# Patient Record
Sex: Male | Born: 1950 | Race: Black or African American | Hispanic: No | Marital: Married | Smoking: Former smoker
Health system: Southern US, Community
[De-identification: ages and names within clinical notes are randomized; demographics above are authoritative.]

## PROBLEM LIST (undated history)

## (undated) DIAGNOSIS — I639 Cerebral infarction, unspecified: Secondary | ICD-10-CM

## (undated) DIAGNOSIS — E079 Disorder of thyroid, unspecified: Secondary | ICD-10-CM

## (undated) DIAGNOSIS — I509 Heart failure, unspecified: Secondary | ICD-10-CM

## (undated) DIAGNOSIS — M109 Gout, unspecified: Secondary | ICD-10-CM

## (undated) DIAGNOSIS — C801 Malignant (primary) neoplasm, unspecified: Secondary | ICD-10-CM

## (undated) DIAGNOSIS — I10 Essential (primary) hypertension: Secondary | ICD-10-CM

## (undated) HISTORY — DX: Cerebral infarction, unspecified: I63.9

## (undated) HISTORY — PX: PROSTATE SURGERY: SHX751

---

## 2000-01-14 DIAGNOSIS — C801 Malignant (primary) neoplasm, unspecified: Secondary | ICD-10-CM

## 2000-01-14 HISTORY — DX: Malignant (primary) neoplasm, unspecified: C80.1

## 2000-04-27 ENCOUNTER — Emergency Department (HOSPITAL_COMMUNITY): Admission: EM | Admit: 2000-04-27 | Discharge: 2000-04-27 | Payer: Self-pay | Admitting: *Deleted

## 2000-04-27 ENCOUNTER — Encounter: Payer: Self-pay | Admitting: *Deleted

## 2000-09-11 ENCOUNTER — Ambulatory Visit (HOSPITAL_COMMUNITY): Admission: RE | Admit: 2000-09-11 | Discharge: 2000-09-11 | Payer: Self-pay | Admitting: Pulmonary Disease

## 2000-10-05 ENCOUNTER — Other Ambulatory Visit: Admission: RE | Admit: 2000-10-05 | Discharge: 2000-10-05 | Payer: Self-pay | Admitting: Urology

## 2003-10-23 ENCOUNTER — Encounter (HOSPITAL_COMMUNITY): Admission: RE | Admit: 2003-10-23 | Discharge: 2003-10-23 | Payer: Self-pay | Admitting: Orthopaedic Surgery

## 2003-11-20 ENCOUNTER — Ambulatory Visit (HOSPITAL_COMMUNITY): Admission: RE | Admit: 2003-11-20 | Discharge: 2003-11-20 | Payer: Self-pay | Admitting: Orthopaedic Surgery

## 2003-12-29 ENCOUNTER — Ambulatory Visit (HOSPITAL_COMMUNITY): Admission: RE | Admit: 2003-12-29 | Discharge: 2003-12-29 | Payer: Self-pay | Admitting: General Surgery

## 2004-01-05 ENCOUNTER — Emergency Department (HOSPITAL_COMMUNITY): Admission: EM | Admit: 2004-01-05 | Discharge: 2004-01-06 | Payer: Self-pay | Admitting: *Deleted

## 2004-02-29 ENCOUNTER — Ambulatory Visit (HOSPITAL_COMMUNITY): Admission: RE | Admit: 2004-02-29 | Discharge: 2004-02-29 | Payer: Self-pay | Admitting: General Surgery

## 2004-03-05 ENCOUNTER — Encounter (HOSPITAL_COMMUNITY): Admission: RE | Admit: 2004-03-05 | Discharge: 2004-03-06 | Payer: Self-pay | Admitting: General Surgery

## 2004-04-03 ENCOUNTER — Encounter (HOSPITAL_COMMUNITY): Admission: RE | Admit: 2004-04-03 | Discharge: 2004-05-03 | Payer: Self-pay | Admitting: Internal Medicine

## 2005-01-08 ENCOUNTER — Ambulatory Visit: Payer: Self-pay | Admitting: Cardiology

## 2005-01-17 ENCOUNTER — Ambulatory Visit: Payer: Self-pay | Admitting: Cardiology

## 2005-01-17 ENCOUNTER — Encounter (HOSPITAL_COMMUNITY): Admission: RE | Admit: 2005-01-17 | Discharge: 2005-02-16 | Payer: Self-pay | Admitting: Cardiology

## 2005-01-24 ENCOUNTER — Ambulatory Visit: Payer: Self-pay | Admitting: Cardiology

## 2005-12-18 IMAGING — NM NM THYROID IMAGING W/ UPTAKE SINGLE (24 HR)
4 series · 4 of 4 positions shown · non-contrast
Comparison: none

CLINICAL DATA: History of thyroid disease.
 THYROID UPTAKE AND SCAN
 Thyroid uptake and scan was performed after the intravenous administration of 10.0 mCi Dc-FFm Pertechnetate and oral administration of 100.0 uCi Iodine 123.  Technetium visual uptake estimate reference to the salivary glands and 24-hour Iodine 123 uptake values were performed.  Thyroid scan in the LAO and RAO projections was performed with pinhole images.
 The Technetium visual uptake estimate was 60%.  The 24-hour Iodine 123 uptake was 59.6%.  Both of these values are elevated.  The images of the thyroid glands show fairly homogeneous uptake bilaterally.  Both glands are enlarged, measuring about 5-6 cm in diameter anteriorly.

[Series 1: th thyroid scan · 2.39mm/px · 1 of 1 slices shown (1 of 4)]
[im 1/1]
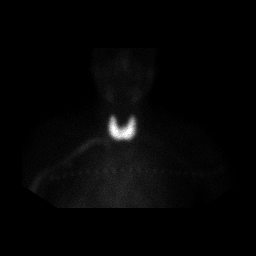

[Series 1: th thyroid scan · 0.54mm/px · 1 of 1 slices shown (2 of 4)]
[im 1/1]
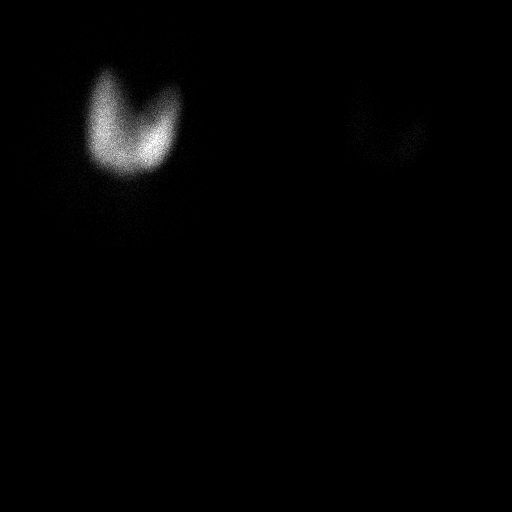

[Series 1: th thyroid scan · 0.54mm/px · 1 of 1 slices shown (3 of 4)]
[im 1/1]
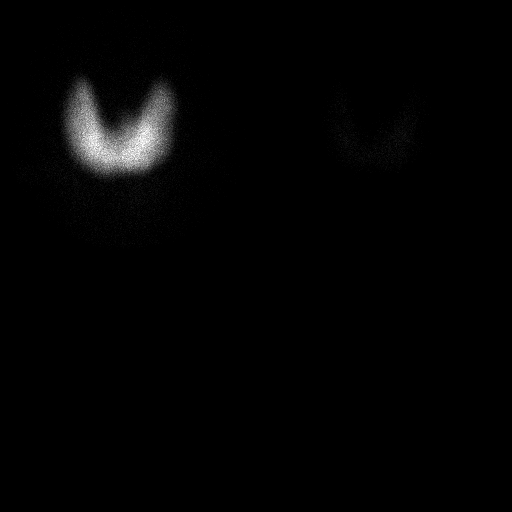

[Series 1: th thyroid scan · 0.54mm/px · 1 of 1 slices shown (4 of 4)]
[im 1/1]
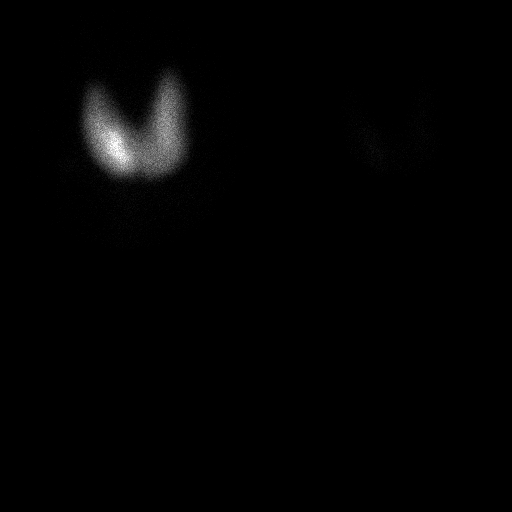

[4 of 4 positions shown; findings below may reference images not displayed]

IMPRESSION: Enlarged glands with increased uptake of 59.6%, suggestive of toxic goiter.

## 2006-11-07 ENCOUNTER — Emergency Department (HOSPITAL_COMMUNITY): Admission: EM | Admit: 2006-11-07 | Discharge: 2006-11-07 | Payer: Self-pay | Admitting: Emergency Medicine

## 2008-06-08 ENCOUNTER — Ambulatory Visit (HOSPITAL_COMMUNITY): Admission: RE | Admit: 2008-06-08 | Discharge: 2008-06-08 | Payer: Self-pay | Admitting: Family Medicine

## 2009-07-27 ENCOUNTER — Encounter: Payer: Self-pay | Admitting: Internal Medicine

## 2009-08-03 ENCOUNTER — Ambulatory Visit: Payer: Self-pay | Admitting: Internal Medicine

## 2009-08-03 ENCOUNTER — Ambulatory Visit (HOSPITAL_COMMUNITY): Admission: RE | Admit: 2009-08-03 | Discharge: 2009-08-03 | Payer: Self-pay | Admitting: Internal Medicine

## 2009-08-07 ENCOUNTER — Encounter: Payer: Self-pay | Admitting: Internal Medicine

## 2010-02-02 ENCOUNTER — Encounter: Payer: Self-pay | Admitting: General Surgery

## 2010-02-12 NOTE — Letter (Signed)
Summary: Patient Notice, Colon Biopsy Results  [facility]  [address]   [place], [place] [address]   Phone: [phone]  Fax: [phone]       August 07, 2009   Edward Rodgers [address] [place], [place]  [address] [birth year]    Dear [name],  I am pleased to inform you that the biopsies taken during your recent colonoscopy did not show any evidence of cancer upon pathologic examination.  Additional information/recommendations:  No further action is needed at this time.  Please follow-up with your primary care physician for your other healthcare needs.  You should have a repeat colonoscopy examination  in 7 years.  Please call us if you are having persistent problems or have questions about your condition that have not been fully answered at this time.  Sincerely,    R. Roetta Sessions MD, FACP [facility] Ph: [phone]    Fax: [phone]   Appended Document: Patient Notice, Colon Biopsy Results Letter mailed to pt.  Appended Document: Patient Notice, Colon Biopsy Results reminder in computer

## 2010-02-12 NOTE — Letter (Signed)
Summary: Internal Other Domingo Dimes  Internal Other Domingo Dimes   Imported By: Cloria Spring LPN 81/19/1478 29:56:21  _____________________________________________________________________  External Attachment:    Type:   Image     Comment:   External Document

## 2010-05-31 NOTE — H&P (Signed)
Edward Rodgers, Edward Rodgers                ACCOUNT NO.:  [id number]   MEDICAL RECORD NO.:  [id number]          PATIENT TYPE:  AMB   LOCATION:  DAY                           FACILITY:  [facility]   PHYSICIAN:  Jerolyn Shin C. Katrinka Blazing, M.D.   DATE OF BIRTH:  05/02/1953   DATE OF ADMISSION:  DATE OF DISCHARGE:  LH                                HISTORY & PHYSICAL   HISTORY OF PRESENT ILLNESS:  A 60 year old male with a history of increasing  weight loss, anorexia, dysphagia, and chronic abdominal pain.  He has been  evaluated since early December.  He has had weight loss from 197 pounds to  162 pounds.  He has pain, which improves with eating.  He has had a recent  change in bowel habits with progressive diarrhea with severe crampy  abdominal discomfort.  The patient also has worsening nocturnal  gastroesophageal reflux and indigestion.  Stools have been guaiac positive.  H. pylori test is positive.  He has been treated with Prevpac.  He remains  symptomatic.  The patient is scheduled for upper and lower endoscopy.   PAST HISTORY:  1.  He has a history of atherosclerotic heart disease.  2.  Congestive heart failure.  3.  Cardiomyopathy.  4.  Hypertension.  5.  Mild dementia.  6.  Prostate cancer.   MEDICATIONS:  1.  Lotrel 10/20 daily.  2.  Flexeril 10 mg q.8h.  3.  Lortab 7.5/750 q.4h. p.r.n.  4.  Nabumetone 750 mg two q.h.s.  5.  Allopurinol 300 mg daily.  6.  Wellbutrin XL 300 mg daily.   SURGICAL HISTORY:  Retropubic prostatectomy in 2002.   REVIEW OF SYSTEMS:  1.  Positive for insomnia.  2.  Anorexia.  3.  Chronic low back pain.  4.  Nocturnal gastroesophageal reflux.  5.  Progressive nocturia.   PHYSICAL EXAMINATION:  VITAL SIGNS:  Blood pressure 140/84, pulse 96,  respirations 18.  Weight 162 pounds.  HEENT:  Unremarkable.  There is no thyromegaly.  NECK:  Supple.  No JVD, bruit, adenopathy, or thyromegaly.  CHEST:  Clear to auscultation.  HEART:  Regular rate and rhythm without  murmur, gallop, or rub.  Mild  tachycardia.  ABDOMEN:  Soft, nontender.  No masses.  EXTREMITIES:  No clubbing, cyanosis, or edema.  RECTAL:  Hollow prostatic fossa.  No masses.  Stool is guaiac positive.  NEUROLOGIC:  No focal motor, sensory, or cerebellar deficit.   IMPRESSION:  1.  Recurrent abdominal pain with weight loss.  2.  Guaiac positive stool.  3.  Gastroesophageal reflux disease.  4.  Helicobacter pylori gastritis with continued symptoms status post      Prevpac treatment.  5.  History of congestive heart failure.  6.  Depression.  7.  Early dementia.   PLAN:  Upper endoscopy with biopsy and total colonoscopy.      LCS/MEDQ  D:  02/28/2004  T:  02/28/2004  Job:  [id number]   cc:   [facility], [place]

## 2010-05-31 NOTE — Procedures (Signed)
Edward Rodgers, Edward Rodgers                ACCOUNT NO.:  [id number]   MEDICAL RECORD NO.:  [id number]           PATIENT TYPE:  REC   LOCATION:  RAD                           FACILITY:  [facility]   PHYSICIAN:  Thomas C. Wall, M.D.   DATE OF BIRTH:  01/08/1979   DATE OF PROCEDURE:  DATE OF DISCHARGE:                                  ECHOCARDIOGRAM   INDICATIONS:  Cardiomegaly (429.3).  Evaluate LV function.   Echocardiogram was technically difficult.   CONCLUSION:  1.  Normal left and right atrial size.  2.  Normal left ventricular chamber size with reduced systolic function.      There is global hypokinesia with an EF around 40%.  It might be a little      lower than this.  There is mild concentric left ventricular hypertrophy.  3.  Minimal mitral regurgitation.  4.  Normal aortic valve.  5.  Normal right sided structure and function.  6.  No pericardial effusion.      Thomas C. Wall, M.D.  Electronically Signed     TCW/MEDQ  D:  01/27/2005  T:  01/28/2005  Job:  [id number]   cc:   Spencer Bing, M.D. [facility]  [address]  [address]  [place]  [place] [address]

## 2013-03-28 ENCOUNTER — Inpatient Hospital Stay (HOSPITAL_COMMUNITY)
Admission: EM | Admit: 2013-03-28 | Discharge: 2013-03-31 | DRG: 065 | Disposition: A | Discharge status: Home / Self Care | Payer: BC Managed Care – PPO | Attending: Family Medicine | Admitting: Family Medicine

## 2013-03-28 ENCOUNTER — Encounter (HOSPITAL_COMMUNITY): Payer: Self-pay | Admitting: Emergency Medicine

## 2013-03-28 ENCOUNTER — Emergency Department (HOSPITAL_COMMUNITY): Payer: BC Managed Care – PPO

## 2013-03-28 DIAGNOSIS — I10 Essential (primary) hypertension: Secondary | ICD-10-CM | POA: Diagnosis present

## 2013-03-28 DIAGNOSIS — R269 Unspecified abnormalities of gait and mobility: Secondary | ICD-10-CM | POA: Diagnosis present

## 2013-03-28 DIAGNOSIS — I5020 Unspecified systolic (congestive) heart failure: Secondary | ICD-10-CM | POA: Diagnosis present

## 2013-03-28 DIAGNOSIS — M47812 Spondylosis without myelopathy or radiculopathy, cervical region: Secondary | ICD-10-CM | POA: Diagnosis present

## 2013-03-28 DIAGNOSIS — Z87891 Personal history of nicotine dependence: Secondary | ICD-10-CM

## 2013-03-28 DIAGNOSIS — R112 Nausea with vomiting, unspecified: Secondary | ICD-10-CM | POA: Diagnosis present

## 2013-03-28 DIAGNOSIS — R42 Dizziness and giddiness: Secondary | ICD-10-CM | POA: Diagnosis present

## 2013-03-28 DIAGNOSIS — I639 Cerebral infarction, unspecified: Secondary | ICD-10-CM | POA: Diagnosis present

## 2013-03-28 DIAGNOSIS — H5010 Unspecified exotropia: Secondary | ICD-10-CM | POA: Diagnosis present

## 2013-03-28 DIAGNOSIS — R7989 Other specified abnormal findings of blood chemistry: Secondary | ICD-10-CM | POA: Diagnosis present

## 2013-03-28 DIAGNOSIS — Z7982 Long term (current) use of aspirin: Secondary | ICD-10-CM

## 2013-03-28 DIAGNOSIS — I509 Heart failure, unspecified: Secondary | ICD-10-CM | POA: Diagnosis present

## 2013-03-28 DIAGNOSIS — R51 Headache: Secondary | ICD-10-CM | POA: Diagnosis present

## 2013-03-28 DIAGNOSIS — D320 Benign neoplasm of cerebral meninges: Secondary | ICD-10-CM | POA: Diagnosis present

## 2013-03-28 DIAGNOSIS — I6350 Cerebral infarction due to unspecified occlusion or stenosis of unspecified cerebral artery: Principal | ICD-10-CM | POA: Diagnosis present

## 2013-03-28 HISTORY — DX: Disorder of thyroid, unspecified: E07.9

## 2013-03-28 HISTORY — DX: Malignant (primary) neoplasm, unspecified: C80.1

## 2013-03-28 HISTORY — DX: Essential (primary) hypertension: I10

## 2013-03-28 HISTORY — DX: Gout, unspecified: M10.9

## 2013-03-28 HISTORY — DX: Heart failure, unspecified: I50.9

## 2013-03-28 LAB — CBC WITH DIFFERENTIAL/PLATELET
Basophils Absolute: 0 10*3/uL (ref 0.0–0.1)
Basophils Relative: 1 % (ref 0–1)
Eosinophils Absolute: 0 10*3/uL (ref 0.0–0.7)
Eosinophils Relative: 0 % (ref 0–5)
HCT: 47.8 % (ref 39.0–52.0)
HEMOGLOBIN: 15.4 g/dL (ref 13.0–17.0)
LYMPHS ABS: 0.6 10*3/uL — AB (ref 0.7–4.0)
LYMPHS PCT: 11 % — AB (ref 12–46)
MCH: 27.6 pg (ref 26.0–34.0)
MCHC: 32.2 g/dL (ref 30.0–36.0)
MCV: 85.7 fL (ref 78.0–100.0)
MONOS PCT: 7 % (ref 3–12)
Monocytes Absolute: 0.4 10*3/uL (ref 0.1–1.0)
NEUTROS ABS: 4.7 10*3/uL (ref 1.7–7.7)
NEUTROS PCT: 81 % — AB (ref 43–77)
PLATELETS: 209 10*3/uL (ref 150–400)
RBC: 5.58 MIL/uL (ref 4.22–5.81)
RDW: 14.2 % (ref 11.5–15.5)
WBC: 5.8 10*3/uL (ref 4.0–10.5)

## 2013-03-28 LAB — COMPREHENSIVE METABOLIC PANEL
ALK PHOS: 82 U/L (ref 39–117)
ALT: 19 U/L (ref 0–53)
AST: 18 U/L (ref 0–37)
Albumin: 4.7 g/dL (ref 3.5–5.2)
BILIRUBIN TOTAL: 0.4 mg/dL (ref 0.3–1.2)
BUN: 10 mg/dL (ref 6–23)
CHLORIDE: 99 meq/L (ref 96–112)
CO2: 28 meq/L (ref 19–32)
Calcium: 9.7 mg/dL (ref 8.4–10.5)
Creatinine, Ser: 0.71 mg/dL (ref 0.50–1.35)
GFR calc non Af Amer: >90 mL/min (ref >90)
GLUCOSE: 116 mg/dL — AB (ref 70–99)
POTASSIUM: 3.9 meq/L (ref 3.7–5.3)
SODIUM: 138 meq/L (ref 137–147)
Total Protein: 9.1 g/dL — ABNORMAL HIGH (ref 6.0–8.3)

## 2013-03-28 LAB — TROPONIN I

## 2013-03-28 MED ORDER — ONDANSETRON 4 MG PO TBDP: ORAL_TABLET | ORAL | Status: DC

## 2013-03-28 MED ORDER — LORAZEPAM 2 MG/ML IJ SOLN
1.0000 mg | Freq: Once | INTRAMUSCULAR | Status: AC
  Administered 2013-03-28: 1 mg via INTRAVENOUS
  Filled 2013-03-28: qty 1

## 2013-03-28 MED ORDER — SODIUM CHLORIDE 0.9 % IV BOLUS (SEPSIS)
1000.0000 mL | Freq: Once | INTRAVENOUS | Status: AC
  Administered 2013-03-28: 1000 mL via INTRAVENOUS

## 2013-03-28 MED ORDER — MECLIZINE HCL 12.5 MG PO TABS
25.0000 mg | ORAL_TABLET | Freq: Once | ORAL | Status: AC
  Administered 2013-03-28: 25 mg via ORAL
  Filled 2013-03-28: qty 2

## 2013-03-28 MED ORDER — ONDANSETRON HCL 4 MG/2ML IJ SOLN
4.0000 mg | Freq: Once | INTRAMUSCULAR | Status: AC
  Administered 2013-03-28: 4 mg via INTRAVENOUS

## 2013-03-28 MED ORDER — MECLIZINE HCL 12.5 MG PO TABS
25.0000 mg | ORAL_TABLET | Freq: Once | ORAL | Status: DC
  Filled 2013-03-28: qty 2

## 2013-03-28 MED ORDER — ONDANSETRON HCL 4 MG/2ML IJ SOLN
4.0000 mg | Freq: Once | INTRAMUSCULAR | Status: DC
  Filled 2013-03-28: qty 2

## 2013-03-28 MED ORDER — FUROSEMIDE 10 MG/ML IJ SOLN: 80.0000 mg | Freq: Once | INTRAMUSCULAR | Status: DC

## 2013-03-28 MED ORDER — MECLIZINE HCL 12.5 MG PO TABS
25.0000 mg | ORAL_TABLET | Freq: Once | ORAL | Status: AC
Start: 2013-03-28 — End: 2013-03-28
  Administered 2013-03-28: 25 mg via ORAL

## 2013-03-28 MED ORDER — MECLIZINE HCL 25 MG PO TABS: ORAL_TABLET | ORAL | Status: DC

## 2013-03-28 MED ORDER — ONDANSETRON 8 MG PO TBDP
8.0000 mg | ORAL_TABLET | Freq: Once | ORAL | Status: AC
  Administered 2013-03-28: 8 mg via ORAL
  Filled 2013-03-28: qty 1

## 2013-03-28 MED ORDER — SODIUM CHLORIDE 0.9 % IV BOLUS (SEPSIS)
500.0000 mL | Freq: Once | INTRAVENOUS | Status: AC
  Administered 2013-03-28: 500 mL via INTRAVENOUS

## 2013-03-28 NOTE — ED Provider Notes (Signed)
CSN: [id number]     Arrival date & time 03/28/13  1917 History   First MD Initiated Contact with Patient 03/28/13 1947     Chief Complaint  Patient presents with  . Dizziness     (Consider location/radiation/quality/duration/timing/severity/associated sxs/prior Treatment) Patient is a 63 y.o. male presenting with dizziness. The history is provided by a relative and the patient (the pt complains of dizziness today).  Dizziness Quality:  Head spinning and imbalance Severity:  Moderate Onset quality:  Sudden Timing:  Intermittent Progression:  Waxing and waning Chronicity:  New Associated symptoms: no chest pain, no diarrhea and no headaches     Past Medical History  Diagnosis Date  . Hypertension   . Gout   . CHF (congestive heart failure)   . Cancer 2002    prostate  . Thyroid disease    Past Surgical History  Procedure Laterality Date  . Prostate surgery     History reviewed. No pertinent family history. History  Substance Use Topics  . Smoking status: Former Research scientist (life sciences)  . Smokeless tobacco: Not on file  . Alcohol Use: No    Review of Systems  Constitutional: Negative for appetite change and fatigue.  HENT: Negative for congestion, ear discharge and sinus pressure.   Eyes: Negative for discharge.  Respiratory: Negative for cough.   Cardiovascular: Negative for chest pain.  Gastrointestinal: Negative for abdominal pain and diarrhea.  Genitourinary: Negative for frequency and hematuria.  Musculoskeletal: Negative for back pain.  Skin: Negative for rash.  Neurological: Positive for dizziness. Negative for seizures and headaches.  Psychiatric/Behavioral: Negative for hallucinations.      Allergies  Review of patient's allergies indicates no known allergies.  Home Medications   Current Outpatient Rx  Name  Route  Sig  Dispense  Refill  . allopurinol (ZYLOPRIM) 300 MG tablet   Oral   Take 300 mg by mouth every morning.         Marland Kitchen amLODipine (NORVASC) 10 MG  tablet   Oral   Take 10 mg by mouth every morning.         Marland Kitchen aspirin EC 81 MG tablet   Oral   Take 81 mg by mouth as needed for mild pain or moderate pain (takes as needed for chest pain).         . Multiple Vitamin (MULTIVITAMIN WITH MINERALS) TABS tablet   Oral   Take 1 tablet by mouth daily.         . solifenacin (VESICARE) 5 MG tablet   Oral   Take 5 mg by mouth every morning.          BP 159/101  Pulse 80  Temp(Src) 97.4 F (36.3 C) (Oral)  Resp 20  Ht 5\' 9"  (1.753 m)  Wt 217 lb (98.431 kg)  BMI 32.03 kg/m2  SpO2 98% Physical Exam  Constitutional: He is oriented to person, place, and time. He appears well-developed.  HENT:  Head: Normocephalic.  Eyes: Conjunctivae and EOM are normal. No scleral icterus.  Neck: Neck supple. No thyromegaly present.  Cardiovascular: Normal rate and regular rhythm.  Exam reveals no gallop and no friction rub.   No murmur heard. Pulmonary/Chest: No stridor. He has no wheezes. He has no rales. He exhibits no tenderness.  Abdominal: He exhibits no distension. There is no tenderness. There is no rebound.  Musculoskeletal: Normal range of motion. He exhibits no edema.  Lymphadenopathy:    He has no cervical adenopathy.  Neurological: He is oriented to person,  place, and time. He exhibits normal muscle tone. Coordination normal.  Skin: No rash noted. No erythema.  Psychiatric: He has a normal mood and affect. His behavior is normal.    ED Course  Procedures (including critical care time) Labs Review Labs Reviewed  CBC WITH DIFFERENTIAL - Abnormal; Notable for the following:    Neutrophils Relative % 81 (*)    Lymphocytes Relative 11 (*)    Lymphs Abs 0.6 (*)    All other components within normal limits  COMPREHENSIVE METABOLIC PANEL  TROPONIN I   Imaging Review No results found.   EKG Interpretation None      MDM   Final diagnoses:  CHF (congestive heart failure)    Pt to get out pt mri and follow up with dr.  Everette Rank this week.    Maudry Diego, MD 03/28/13 2135

## 2013-03-28 NOTE — ED Notes (Signed)
Dizzy, vomiting, left side of neck hurting, and I have been staggering all over the place per pt. Started getting dizzy yesterday, and today it got worse per patient. I have not taken my blood pressure pills today per pt.

## 2013-03-28 NOTE — ED Notes (Signed)
Pt for discharge, declined w/c to car, thinks he's better off walking. Felt all right until he got to registration desk and had overwhelming dizziness and nausea. Pt knelt to floor and triage tech assisted him to chair. I went out to assess situation and pt initially just wanted something to drink and to sit awhile. Gave him some gingerale which he took w/o difficulty but still "just don't feel right".I offered to bring pt back to ED. Wife was pushing to go home, pt wouldn't say yes or no. Dr. Lacinda Axon came out to see pt and tried an ambulation again. Pt felt worse. Lips pale. "I just don't feel right". Pt brought back to ED. Dr. Lacinda Axon to arrange for admission.

## 2013-03-28 NOTE — ED Notes (Signed)
Trial ambulation to bathroom, pt tolerated fairly well. States he's much better than upon admission.

## 2013-03-28 NOTE — Discharge Instructions (Signed)
Follow up with your md in 2-3 days.  Follow up for your mri as scheduled this week.

## 2013-03-28 NOTE — ED Notes (Signed)
Pt states he has vomited twice today when he was having a bad dizzy spell. No reports of constant nausea. Dizzy spells are intermittent and unpredictable. No worse with position change

## 2013-03-28 NOTE — ED Provider Notes (Signed)
Call to the triage area to reevaluate patient. He is still very vertiginous with ambulation. Will admit to observation.  Nat Christen, MD 03/28/13 2252

## 2013-03-29 ENCOUNTER — Observation Stay (HOSPITAL_COMMUNITY): Payer: BC Managed Care – PPO

## 2013-03-29 ENCOUNTER — Inpatient Hospital Stay (HOSPITAL_COMMUNITY): Payer: BC Managed Care – PPO

## 2013-03-29 DIAGNOSIS — R42 Dizziness and giddiness: Secondary | ICD-10-CM

## 2013-03-29 DIAGNOSIS — I639 Cerebral infarction, unspecified: Secondary | ICD-10-CM | POA: Diagnosis present

## 2013-03-29 LAB — TROPONIN I
Troponin I: <0.3 ng/mL (ref <0.30)
Troponin I: <0.3 ng/mL (ref <0.30)
Troponin I: <0.3 ng/mL (ref <0.30)

## 2013-03-29 LAB — CBC
HEMATOCRIT: 45.1 % (ref 39.0–52.0)
Hemoglobin: 14.6 g/dL (ref 13.0–17.0)
MCH: 27.7 pg (ref 26.0–34.0)
MCHC: 32.4 g/dL (ref 30.0–36.0)
MCV: 85.4 fL (ref 78.0–100.0)
Platelets: 199 10*3/uL (ref 150–400)
RBC: 5.28 MIL/uL (ref 4.22–5.81)
RDW: 14.3 % (ref 11.5–15.5)
WBC: 6.2 10*3/uL (ref 4.0–10.5)

## 2013-03-29 LAB — COMPREHENSIVE METABOLIC PANEL
ALT: 14 U/L (ref 0–53)
AST: 14 U/L (ref 0–37)
Albumin: 3.7 g/dL (ref 3.5–5.2)
Alkaline Phosphatase: 70 U/L (ref 39–117)
BILIRUBIN TOTAL: 0.4 mg/dL (ref 0.3–1.2)
BUN: 8 mg/dL (ref 6–23)
CALCIUM: 9.2 mg/dL (ref 8.4–10.5)
CO2: 28 meq/L (ref 19–32)
CREATININE: 0.72 mg/dL (ref 0.50–1.35)
Chloride: 103 mEq/L (ref 96–112)
GFR calc Af Amer: >90 mL/min (ref >90)
Glucose, Bld: 103 mg/dL — ABNORMAL HIGH (ref 70–99)
Potassium: 4 mEq/L (ref 3.7–5.3)
Sodium: 140 mEq/L (ref 137–147)
Total Protein: 7.4 g/dL (ref 6.0–8.3)

## 2013-03-29 LAB — MRSA PCR SCREENING: MRSA BY PCR: NEGATIVE

## 2013-03-29 MED ORDER — IBUPROFEN 800 MG PO TABS
400.0000 mg | ORAL_TABLET | Freq: Three times a day (TID) | ORAL | Status: DC | PRN
  Administered 2013-03-29: 400 mg via ORAL
  Filled 2013-03-29: qty 1

## 2013-03-29 MED ORDER — SODIUM CHLORIDE 0.9 % IV SOLN
INTRAVENOUS | Status: DC
  Administered 2013-03-29 – 2013-03-30 (×4): via INTRAVENOUS

## 2013-03-29 MED ORDER — DARIFENACIN HYDROBROMIDE ER 7.5 MG PO TB24
7.5000 mg | ORAL_TABLET | Freq: Every day | ORAL | Status: DC
  Administered 2013-03-29 – 2013-03-31 (×3): 7.5 mg via ORAL
  Filled 2013-03-29 (×4): qty 1

## 2013-03-29 MED ORDER — MECLIZINE HCL 12.5 MG PO TABS
25.0000 mg | ORAL_TABLET | Freq: Three times a day (TID) | ORAL | Status: DC
  Administered 2013-03-29 – 2013-03-30 (×5): 25 mg via ORAL
  Filled 2013-03-29 (×6): qty 2

## 2013-03-29 MED ORDER — ASPIRIN 325 MG PO TABS
325.0000 mg | ORAL_TABLET | Freq: Every day | ORAL | Status: DC
  Administered 2013-03-29 – 2013-03-31 (×3): 325 mg via ORAL
  Filled 2013-03-29 (×3): qty 1

## 2013-03-29 MED ORDER — ASPIRIN EC 81 MG PO TBEC: 81.0000 mg | DELAYED_RELEASE_TABLET | ORAL | Status: DC | PRN

## 2013-03-29 MED ORDER — ONDANSETRON HCL 4 MG PO TABS
4.0000 mg | ORAL_TABLET | Freq: Four times a day (QID) | ORAL | Status: DC | PRN
Start: 2013-03-29 — End: 2013-03-31

## 2013-03-29 MED ORDER — ATORVASTATIN CALCIUM 20 MG PO TABS
20.0000 mg | ORAL_TABLET | Freq: Every day | ORAL | Status: DC
  Administered 2013-03-29 – 2013-03-30 (×2): 20 mg via ORAL
  Filled 2013-03-29 (×2): qty 1

## 2013-03-29 MED ORDER — AMLODIPINE BESYLATE 5 MG PO TABS
10.0000 mg | ORAL_TABLET | Freq: Every morning | ORAL | Status: DC
  Administered 2013-03-29 – 2013-03-31 (×3): 10 mg via ORAL
  Filled 2013-03-29 (×3): qty 2

## 2013-03-29 MED ORDER — ENOXAPARIN SODIUM 40 MG/0.4ML ~~LOC~~ SOLN
40.0000 mg | Freq: Every day | SUBCUTANEOUS | Status: DC
  Administered 2013-03-29 – 2013-03-30 (×3): 40 mg via SUBCUTANEOUS
  Filled 2013-03-29 (×3): qty 0.4

## 2013-03-29 MED ORDER — ONDANSETRON HCL 4 MG/2ML IJ SOLN: 4.0000 mg | Freq: Four times a day (QID) | INTRAMUSCULAR | Status: DC | PRN

## 2013-03-29 MED ORDER — ALLOPURINOL 300 MG PO TABS
300.0000 mg | ORAL_TABLET | Freq: Every morning | ORAL | Status: DC
  Administered 2013-03-29 – 2013-03-31 (×3): 300 mg via ORAL
  Filled 2013-03-29 (×3): qty 1

## 2013-03-29 NOTE — Consult Note (Signed)
Edward A. Merlene Laughter, MD     [email or web address]          Edward Rodgers is an 63 y.o. male.   ASSESSMENT/PLAN: 1. Acute onset of vertigo along with nausea and vomiting. 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 The patient does have what appears to be a meningioma and CT scan which raised the possibility of seizures although nothing this is less likely. Patient has a brain MRI pending. This will be followed. He may need to have a stroke workup depending on the results of the MRI findings. He has been placed on aspirin and DVT prophylaxis.  This is a 63 year old right-handed black male who presents with the acute onset of rather severe vertiginous symptoms described as a spinning like sensation as if intoxicated. The event lasted for about 1 hour. He was associated with gait instability and stumbling to one side or the other. He also reports having left frontal headache. The patient tells me that he does have episodic spells of stumbling on the past several years although the spells are typically fleeting. The patient does not report any associated chest pain, shortness of breath, dysarthria, dysphagia, diplopia or GU symptoms. He did have about 4 episodes of vomiting with this spell. The review of systems otherwise negative.  GENERAL: This a pleasant average weight man in no acute distress.  HEENT: Supple. Atraumatic normocephalic. There is a right exotropia.  ABDOMEN: soft  EXTREMITIES: No edema   BACK: Normal.  SKIN: Normal by inspection.    MENTAL STATUS: Alert and oriented. Speech, language and cognition are generally intact. Judgment and insight normal.   CRANIAL NERVES: Pupils are equal, round and reactive to light and accommodation; extra ocular movements are full, there is no significant nystagmus; visual fields are full; upper and lower facial muscles are normal in strength and symmetric, there is  Mild flattening of the nasolabial fold On the right side; tongue is midline; uvula is midline; shoulder elevation is normal.  MOTOR: Normal tone, bulk and strength; no pronator drift.  COORDINATION: Left finger to nose is normal, right finger to nose is normal, No rest tremor; no intention tremor; no postural tremor; no bradykinesia. Additionally, rapid alternating movements are normal bilaterally  REFLEXES: Deep tendon reflexes are symmetrical and normal - Except at the patella with her diminished requiring augmentation. Babinski reflexes are flexor bilaterally.   SENSATION: Normal to light touch.  GAIT:Slightly unsteady but able take a few steps with minimal assistance.    Past Medical History  Diagnosis Date  . Hypertension   . Gout   . CHF (congestive heart failure)   . Cancer 2002    prostate  . Thyroid disease     Past Surgical History  Procedure Laterality Date  . Prostate surgery      History reviewed. No pertinent family history.  Social History:  reports that he has quit smoking. He does not have any smokeless tobacco history on file. He reports that he does not drink alcohol or use illicit drugs.  Allergies: No Known Allergies  Medications: Prior to Admission medications   Medication Sig Start Date End Date Taking? Authorizing Provider  allopurinol (ZYLOPRIM) 300 MG tablet Take 300 mg by mouth every morning.   Yes Historical Provider, MD  amLODipine (NORVASC) 10 MG tablet Take 10 mg by mouth every morning.   Yes Historical Provider, MD  aspirin EC 81 MG tablet Take 81 mg by mouth as needed  for mild pain or moderate pain (takes as needed for chest pain).   Yes Historical Provider, MD  Multiple Vitamin (MULTIVITAMIN WITH MINERALS) TABS tablet Take 1 tablet by mouth daily.   Yes Historical Provider, MD  solifenacin (VESICARE) 5 MG tablet Take 5 mg by mouth every morning.   Yes Historical Provider, MD  meclizine (ANTIVERT) 25 MG tablet Take one every 6 hours for  dizziness 03/28/13   Maudry Diego, MD  ondansetron (ZOFRAN ODT) 4 MG disintegrating tablet 4mg  ODT q4 hours prn nausea/vomit 03/28/13   Maudry Diego, MD    Scheduled Meds: . allopurinol  300 mg Oral q morning - 10a  . amLODipine  10 mg Oral q morning - 10a  . darifenacin  7.5 mg Oral Daily  . enoxaparin (LOVENOX) injection  40 mg Subcutaneous QHS  . meclizine  25 mg Oral TID   Continuous Infusions: . sodium chloride 75 mL/hr at 03/29/13 0116   PRN Meds:.aspirin EC, ondansetron (ZOFRAN) IV, ondansetron   Blood pressure 141/87, pulse 82, temperature 97.9 F (36.6 C), temperature source Oral, resp. rate 20, height 5\' 9"  (1.753 m), weight 94.2 kg (207 lb 10.8 oz), SpO2 96.00%.   Results for orders placed during the hospital encounter of 03/28/13 (from the past 48 hour(s))  CBC WITH DIFFERENTIAL     Status: Abnormal   Collection Time    03/28/13  8:01 PM      Result Value Ref Range   WBC 5.8  4.0 - 10.5 K/uL   RBC 5.58  4.22 - 5.81 MIL/uL   Hemoglobin 15.4  13.0 - 17.0 g/dL   HCT 47.8  39.0 - 52.0 %   MCV 85.7  78.0 - 100.0 fL   MCH 27.6  26.0 - 34.0 pg   MCHC 32.2  30.0 - 36.0 g/dL   RDW 14.2  11.5 - 15.5 %   Platelets 209  150 - 400 K/uL   Neutrophils Relative % 81 (*) 43 - 77 %   Neutro Abs 4.7  1.7 - 7.7 K/uL   Lymphocytes Relative 11 (*) 12 - 46 %   Lymphs Abs 0.6 (*) 0.7 - 4.0 K/uL   Monocytes Relative 7  3 - 12 %   Monocytes Absolute 0.4  0.1 - 1.0 K/uL   Eosinophils Relative 0  0 - 5 %   Eosinophils Absolute 0.0  0.0 - 0.7 K/uL   Basophils Relative 1  0 - 1 %   Basophils Absolute 0.0  0.0 - 0.1 K/uL  COMPREHENSIVE METABOLIC PANEL     Status: Abnormal   Collection Time    03/28/13  8:01 PM      Result Value Ref Range   Sodium 138  137 - 147 mEq/L   Potassium 3.9  3.7 - 5.3 mEq/L   Chloride 99  96 - 112 mEq/L   CO2 28  19 - 32 mEq/L   Glucose, Bld 116 (*) 70 - 99 mg/dL   BUN 10  6 - 23 mg/dL   Creatinine, Ser 0.71  0.50 - 1.35 mg/dL   Calcium 9.7  8.4 -  10.5 mg/dL   Total Protein 9.1 (*) 6.0 - 8.3 g/dL   Albumin 4.7  3.5 - 5.2 g/dL   AST 18  0 - 37 U/L   ALT 19  0 - 53 U/L   Alkaline Phosphatase 82  39 - 117 U/L   Total Bilirubin 0.4  0.3 - 1.2 mg/dL   GFR calc non  Af Amer >90  >90 mL/min   GFR calc Af Amer >90  >90 mL/min   Comment: (NOTE)     The eGFR has been calculated using the CKD EPI equation.     This calculation has not been validated in all clinical situations.     eGFR's persistently <90 mL/min signify possible Chronic Kidney     Disease.  TROPONIN I     Status: None   Collection Time    03/28/13  8:01 PM      Result Value Ref Range   Troponin I <0.30  <0.30 ng/mL   Comment:            Due to the release kinetics of cTnI,     a negative result within the first hours     of the onset of symptoms does not rule out     myocardial infarction with certainty.     If myocardial infarction is still suspected,     repeat the test at appropriate intervals.  MRSA PCR SCREENING     Status: None   Collection Time    03/29/13  1:00 AM      Result Value Ref Range   MRSA by PCR NEGATIVE  NEGATIVE   Comment:            The GeneXpert MRSA Assay (FDA     approved for NASAL specimens     only), is one component of a     comprehensive MRSA colonization     surveillance program. It is not     intended to diagnose MRSA     infection nor to guide or     monitor treatment for     MRSA infections.  TROPONIN I     Status: None   Collection Time    03/29/13  1:21 AM      Result Value Ref Range   Troponin I <0.30  <0.30 ng/mL   Comment:            Due to the release kinetics of cTnI,     a negative result within the first hours     of the onset of symptoms does not rule out     myocardial infarction with certainty.     If myocardial infarction is still suspected,     repeat the test at appropriate intervals.  TROPONIN I     Status: None   Collection Time    03/29/13  7:11 AM      Result Value Ref Range   Troponin I <0.30   <0.30 ng/mL   Comment:            Due to the release kinetics of cTnI,     a negative result within the first hours     of the onset of symptoms does not rule out     myocardial infarction with certainty.     If myocardial infarction is still suspected,     repeat the test at appropriate intervals.  CBC     Status: None   Collection Time    03/29/13  7:11 AM      Result Value Ref Range   WBC 6.2  4.0 - 10.5 K/uL   RBC 5.28  4.22 - 5.81 MIL/uL   Hemoglobin 14.6  13.0 - 17.0 g/dL   HCT 45.1  39.0 - 52.0 %   MCV 85.4  78.0 - 100.0 fL   MCH 27.7  26.0 - 34.0 pg  MCHC 32.4  30.0 - 36.0 g/dL   RDW 14.3  11.5 - 15.5 %   Platelets 199  150 - 400 K/uL  COMPREHENSIVE METABOLIC PANEL     Status: Abnormal   Collection Time    03/29/13  7:11 AM      Result Value Ref Range   Sodium 140  137 - 147 mEq/L   Potassium 4.0  3.7 - 5.3 mEq/L   Chloride 103  96 - 112 mEq/L   CO2 28  19 - 32 mEq/L   Glucose, Bld 103 (*) 70 - 99 mg/dL   BUN 8  6 - 23 mg/dL   Creatinine, Ser 0.72  0.50 - 1.35 mg/dL   Calcium 9.2  8.4 - 10.5 mg/dL   Total Protein 7.4  6.0 - 8.3 g/dL   Albumin 3.7  3.5 - 5.2 g/dL   AST 14  0 - 37 U/L   ALT 14  0 - 53 U/L   Alkaline Phosphatase 70  39 - 117 U/L   Total Bilirubin 0.4  0.3 - 1.2 mg/dL   GFR calc non Af Amer >90  >90 mL/min   GFR calc Af Amer >90  >90 mL/min   Comment: (NOTE)     The eGFR has been calculated using the CKD EPI equation.     This calculation has not been validated in all clinical situations.     eGFR's persistently <90 mL/min signify possible Chronic Kidney     Disease.    Ct Head Wo Contrast  03/28/2013   CLINICAL DATA:  Dizziness. Nausea and vomiting. Left neck pain. Hypertension.  EXAM: CT HEAD WITHOUT CONTRAST  CT CERVICAL SPINE WITHOUT CONTRAST  TECHNIQUE: Multidetector CT imaging of the head and cervical spine was performed following the standard protocol without intravenous contrast. Multiplanar CT image reconstructions of the cervical spine  were also generated.  COMPARISON:  None.  FINDINGS: CT HEAD FINDINGS  Calcified 2 cm left parafalcine mildly hyperdense mass. No associated vasogenic edema in the left frontal lobe. Mass appears to be extra-axial and based along the falx.  Otherwise, the brainstem, cerebellum, cerebral peduncles, thalamus, basal ganglia, basilar cisterns, and ventricular system appear within normal limits. No acute CVA or intracranial hemorrhage is observed.  CT CERVICAL SPINE FINDINGS  Cervical spondylosis with facet degenerative arthropathy particularly at C5-6 and C6-7, likely causing slight osseous foraminal stenosis on the left at these 2 levels. Probable hemangioma eccentric to the right in the C5 vertebra.  No cervical spine fracture or acute subluxation is identified.  IMPRESSION: 1. 2 cm left parafalcine hyperdense mass with associated calcification. I strongly favor a parafalcine meningioma. This could be further characterized with a followup nonemergent MRI of the brain, if clinically warranted. 2. Cervical spondylosis.   Electronically Signed   By: Sherryl Barters M.D.   On: 03/28/2013 21:05   Ct Cervical Spine Wo Contrast  03/28/2013   CLINICAL DATA:  Dizziness. Nausea and vomiting. Left neck pain. Hypertension.  EXAM: CT HEAD WITHOUT CONTRAST  CT CERVICAL SPINE WITHOUT CONTRAST  TECHNIQUE: Multidetector CT imaging of the head and cervical spine was performed following the standard protocol without intravenous contrast. Multiplanar CT image reconstructions of the cervical spine were also generated.  COMPARISON:  None.  FINDINGS: CT HEAD FINDINGS  Calcified 2 cm left parafalcine mildly hyperdense mass. No associated vasogenic edema in the left frontal lobe. Mass appears to be extra-axial and based along the falx.  Otherwise, the brainstem, cerebellum, cerebral peduncles, thalamus, basal  ganglia, basilar cisterns, and ventricular system appear within normal limits. No acute CVA or intracranial hemorrhage is observed.   CT CERVICAL SPINE FINDINGS  Cervical spondylosis with facet degenerative arthropathy particularly at C5-6 and C6-7, likely causing slight osseous foraminal stenosis on the left at these 2 levels. Probable hemangioma eccentric to the right in the C5 vertebra.  No cervical spine fracture or acute subluxation is identified.  IMPRESSION: 1. 2 cm left parafalcine hyperdense mass with associated calcification. I strongly favor a parafalcine meningioma. This could be further characterized with a followup nonemergent MRI of the brain, if clinically warranted. 2. Cervical spondylosis.   Electronically Signed   By: Sherryl Barters M.D.   On: 03/28/2013 21:05        Farhaan Mabee A. Merlene Rodgers, M.D.  Diplomate, Tax adviser of Psychiatry and Neurology ( Neurology). 03/29/2013, 8:35 AM

## 2013-03-29 NOTE — Care Management Note (Signed)
    Page 1 of 1   03/29/2013     3:18:41 PM   CARE MANAGEMENT NOTE 03/29/2013  Patient:  Edward Rodgers, Edward Rodgers   Account Number:  [id number]  Date Initiated:  03/29/2013  Documentation initiated by:  Theophilus Kinds  Subjective/Objective Assessment:   Pt admitted from home with vertigo. Pt lives with his wife and will return home at discharge. Pt has been independent with ADl's.     Action/Plan:   No CM needs noted.   Anticipated DC Date:  03/31/2013   Anticipated DC Plan:  [place]  CM consult      Choice offered to / List presented to:             Status of service:  Completed, signed off Medicare Important Message given?   (If response is "NO", the following Medicare IM given date fields will be blank) Date Medicare IM given:   Date Additional Medicare IM given:    Discharge Disposition:  HOME/SELF CARE  Per UR Regulation:    If discussed at Long Length of Stay Meetings, dates discussed:    Comments:  03/29/13 [place], RN BSN CM

## 2013-03-29 NOTE — Progress Notes (Addendum)
[facility] PROGRESS NOTE  MANSA WILLERS SAY:[id number] DOB: Aug 05, 1950 DOA: 03/28/2013 PCP: Lanette Hampshire, MD   Brief narrative 63 year old male with history of hypertension, gout, history of CHF, thyroid disease, presented to the ED with dizziness for one day with 4 episodes of vomiting. Head CT done in the ED showed 2 cm left parafalcine hyperdense mass with associated calcification. MRI of the brain with concern for posterior circulation infarct showed a 4 mm punctate acute right cerebellar infarct and left frontal parafalcine mass consistent with meningioma.  Assessment/Plan: Acute right cerebellar infarct Monitor on telemetry. Possible etiology includes uncontrolled hypertension.  -Allow permissive blood pressure for now. Patient still dizzy on standing but dramatically improving. continue neurochecks. -ASA 325 mg daily. Add lipitor 20 mg daily -Continue IV hydration. A 2-D echo, carotid Doppler, hemoglobin A1c and lipid panel. PT/OT eval. -Appreciate neurology recommendations.  Uncontrolled Hypertension  Continue amlodipine. Allow permissive blood pressure.   Parafalcine meningioma  CT head shows parafalcine mass with calcification, with suspicion for meningioma. MRI brain confirms the same. Followup as outpatient.  Cervical spondylosis  CT cervical spine shows cervical spondylosis, degenerative arthropathy at C5-6 and C6-7 likely causing slight osseous foraminal stenosis on the left at these 2 levels.  Will require neurosurgical evaluation as outpatient .  Hx of CHF Patient's last echo in the system 2007 shows global  hypokinesis with EF of 40%. Currently euvolemic. Followup with 2D echo results.  DVT prophylaxis: Subcutaneous Lovenox Diet: Cardiac      Code Status: full code Family Communication: none At bedside  Disposition Plan: Home once improved and workup completed.    Consultants:    neurology  Procedures:  none  Antibiotics:  None  HPI/Subjective: Patient seen and examined this morning. Admission H&P reviewed. Reports still having dizziness but improved  Objective: Filed Vitals:   03/29/13 1045  BP: 163/108  Pulse:   Temp:   Resp:     Intake/Output Summary (Last 24 hours) at 03/29/13 1155 Last data filed at 03/29/13 0819  Gross per 24 hour  Intake    355 ml  Output   1400 ml  Net  -1045 ml   Filed Weights   03/28/13 1920 03/29/13 0500  Weight: 98.431 kg (217 lb) 94.2 kg (207 lb 10.8 oz)    Exam:   General:  Elderly male in no acute distress  HEENT: No pallor, moist oral mucosa, no nystagmus  Chest: Clear to auscultation bilaterally, no added sounds  CVS: Normal S1 and S2, no murmurs rub or gallop  Abdomen: Soft, nontender, nondistended, bowel sounds present  Extremities: No edema  CNS: AAO x3  Data Reviewed: Basic Metabolic Panel:  Recent Labs Lab 03/28/13 2001 03/29/13 0711  NA 138 140  K 3.9 4.0  CL 99 103  CO2 28 28  GLUCOSE 116* 103*  BUN 10 8  CREATININE 0.71 0.72  CALCIUM 9.7 9.2   Liver Function Tests:  Recent Labs Lab 03/28/13 2001 03/29/13 0711  AST 18 14  ALT 19 14  ALKPHOS 82 70  BILITOT 0.4 0.4  PROT 9.1* 7.4  ALBUMIN 4.7 3.7   No results found for this basename: LIPASE, AMYLASE,  in the last 168 hours No results found for this basename: AMMONIA,  in the last 168 hours CBC:  Recent Labs Lab 03/28/13 2001 03/29/13 0711  WBC 5.8 6.2  NEUTROABS 4.7  --   HGB 15.4 14.6  HCT 47.8 45.1  MCV 85.7 85.4  PLT 209 199   Cardiac Enzymes:  Recent Labs Lab 03/28/13 2001 03/29/13 0121 03/29/13 0711  TROPONINI <0.30 <0.30 <0.30   BNP (last 3 results) No results found for this basename: PROBNP,  in the last 8760 hours CBG: No results found for this basename: GLUCAP,  in the last 168 hours  Recent Results (from the past 240 hour(s))  MRSA PCR SCREENING     Status: None   Collection Time     03/29/13  1:00 AM      Result Value Ref Range Status   MRSA by PCR NEGATIVE  NEGATIVE Final   Comment:            The GeneXpert MRSA Assay (FDA     approved for NASAL specimens     only), is one component of a     comprehensive MRSA colonization     surveillance program. It is not     intended to diagnose MRSA     infection nor to guide or     monitor treatment for     MRSA infections.     Studies: Ct Head Wo Contrast  03/28/2013   CLINICAL DATA:  Dizziness. Nausea and vomiting. Left neck pain. Hypertension.  EXAM: CT HEAD WITHOUT CONTRAST  CT CERVICAL SPINE WITHOUT CONTRAST  TECHNIQUE: Multidetector CT imaging of the head and cervical spine was performed following the standard protocol without intravenous contrast. Multiplanar CT image reconstructions of the cervical spine were also generated.  COMPARISON:  None.  FINDINGS: CT HEAD FINDINGS  Calcified 2 cm left parafalcine mildly hyperdense mass. No associated vasogenic edema in the left frontal lobe. Mass appears to be extra-axial and based along the falx.  Otherwise, the brainstem, cerebellum, cerebral peduncles, thalamus, basal ganglia, basilar cisterns, and ventricular system appear within normal limits. No acute CVA or intracranial hemorrhage is observed.  CT CERVICAL SPINE FINDINGS  Cervical spondylosis with facet degenerative arthropathy particularly at C5-6 and C6-7, likely causing slight osseous foraminal stenosis on the left at these 2 levels. Probable hemangioma eccentric to the right in the C5 vertebra.  No cervical spine fracture or acute subluxation is identified.  IMPRESSION: 1. 2 cm left parafalcine hyperdense mass with associated calcification. I strongly favor a parafalcine meningioma. This could be further characterized with a followup nonemergent MRI of the brain, if clinically warranted. 2. Cervical spondylosis.   Electronically Signed   By: Herbie Baltimore M.D.   On: 03/28/2013 21:05   Ct Cervical Spine Wo  Contrast  03/28/2013   CLINICAL DATA:  Dizziness. Nausea and vomiting. Left neck pain. Hypertension.  EXAM: CT HEAD WITHOUT CONTRAST  CT CERVICAL SPINE WITHOUT CONTRAST  TECHNIQUE: Multidetector CT imaging of the head and cervical spine was performed following the standard protocol without intravenous contrast. Multiplanar CT image reconstructions of the cervical spine were also generated.  COMPARISON:  None.  FINDINGS: CT HEAD FINDINGS  Calcified 2 cm left parafalcine mildly hyperdense mass. No associated vasogenic edema in the left frontal lobe. Mass appears to be extra-axial and based along the falx.  Otherwise, the brainstem, cerebellum, cerebral peduncles, thalamus, basal ganglia, basilar cisterns, and ventricular system appear within normal limits. No acute CVA or intracranial hemorrhage is observed.  CT CERVICAL SPINE FINDINGS  Cervical spondylosis with facet degenerative arthropathy particularly at C5-6 and C6-7, likely causing slight osseous foraminal stenosis on the left at these 2 levels. Probable hemangioma eccentric to the right in the C5 vertebra.  No cervical spine fracture or acute subluxation is identified.  IMPRESSION: 1. 2 cm left parafalcine  hyperdense mass with associated calcification. I strongly favor a parafalcine meningioma. This could be further characterized with a followup nonemergent MRI of the brain, if clinically warranted. 2. Cervical spondylosis.   Electronically Signed   By: Sherryl Barters M.D.   On: 03/28/2013 21:05   Mr Brain Wo Contrast  03/29/2013   CLINICAL DATA:  Possible meningioma on head CT. Weakness and dizziness.  EXAM: MRI HEAD WITHOUT CONTRAST  TECHNIQUE: Multiplanar, multiecho pulse sequences of the brain and surrounding structures were obtained without intravenous contrast.  COMPARISON:  Head CT 03/28/2013  FINDINGS: There is a 4 mm acute infarct in the inferior right cerebellar hemisphere. There is no evidence of intracranial hemorrhage. There is a 2.2 x 2.1 cm  left frontal parafalcine extra-axial mass. There is no edema in the surrounding brain parenchyma. Minimal, age-related chronic microvascular ischemic change is noted. Mild age-related cerebral atrophy is present. There is no midline shift or extra-axial fluid collection.  Prior right cataract surgery noted. Paranasal sinuses and mastoid air cells are clear. Major intracranial vascular flow voids are preserved.  IMPRESSION: 1. Punctate, acute right cerebellar infarct. 2. Left frontal parafalcine mass, consistent with a meningioma.   Electronically Signed   By: Logan Bores   On: 03/29/2013 11:21    Scheduled Meds: . allopurinol  300 mg Oral q morning - 10a  . amLODipine  10 mg Oral q morning - 10a  . aspirin  325 mg Oral Daily  . darifenacin  7.5 mg Oral Daily  . enoxaparin (LOVENOX) injection  40 mg Subcutaneous QHS  . meclizine  25 mg Oral TID   Continuous Infusions: . sodium chloride 75 mL/hr at 03/29/13 0116      Time spent: 25 minutes    Analiah Drum, [place]  [facility] Pager [phone] If 7PM-7AM, please contact night-coverage at [email or web address], password [facility] 03/29/2013, 11:55 AM  LOS: 1 day

## 2013-03-29 NOTE — Progress Notes (Signed)
UR completed. Patient changed to inpatient- acute CVA 

## 2013-03-29 NOTE — H&P (Signed)
PCP:   No primary provider on file.   Chief Complaint:  Dizziness  HPI: 63 year old male who   has a past medical history of Hypertension; Gout; CHF (congestive heart failure); Cancer (2002); and Thyroid disease. Today presents to the ED with chief complaint of dizziness which has been going on since yesterday and became worse today in the afternoon with 4 episodes of vomiting. Patient says that he has difficulty walking due to dizziness, he denies any chest pain no shortness of breath complains of nausea and vomiting. Patient does not have a history of previous episodes like this. In the ED CT head was done which showed 2 cm left parafalcine hyperdense mass with associated calcification. CT cervical spine showed cervical spondylosis.  Allergies:  No Known Allergies    Past Medical History  Diagnosis Date  . Hypertension   . Gout   . CHF (congestive heart failure)   . Cancer 2002    prostate  . Thyroid disease     Past Surgical History  Procedure Laterality Date  . Prostate surgery      Prior to Admission medications   Medication Sig Start Date End Date Taking? Authorizing Provider  allopurinol (ZYLOPRIM) 300 MG tablet Take 300 mg by mouth every morning.   Yes Historical Provider, MD  amLODipine (NORVASC) 10 MG tablet Take 10 mg by mouth every morning.   Yes Historical Provider, MD  aspirin EC 81 MG tablet Take 81 mg by mouth as needed for mild pain or moderate pain (takes as needed for chest pain).   Yes Historical Provider, MD  Multiple Vitamin (MULTIVITAMIN WITH MINERALS) TABS tablet Take 1 tablet by mouth daily.   Yes Historical Provider, MD  solifenacin (VESICARE) 5 MG tablet Take 5 mg by mouth every morning.   Yes Historical Provider, MD  meclizine (ANTIVERT) 25 MG tablet Take one every 6 hours for dizziness 03/28/13   Maudry Diego, MD  ondansetron (ZOFRAN ODT) 4 MG disintegrating tablet 4mg  ODT q4 hours prn nausea/vomit 03/28/13   Maudry Diego, MD    Social  History:  reports that he has quit smoking. He does not have any smokeless tobacco history on file. He reports that he does not drink alcohol or use illicit drugs.    All the positives are listed in BOLD  Review of Systems:  HEENT: Headache, blurred vision, runny nose, sore throat Neck: Hypothyroidism, hyperthyroidism,,lymphadenopathy Chest : Shortness of breath, history of COPD, Asthma Heart : Chest pain, history of coronary arterey disease GI:  Nausea, vomiting, diarrhea, constipation, GERD GU: Dysuria, urgency, frequency of urination, hematuria Neuro: Stroke, seizures, syncope Psych: Depression, anxiety, hallucinations   Physical Exam: Blood pressure 168/94, pulse 65, temperature 97.2 F (36.2 C), temperature source Oral, resp. rate 18, height 5\' 9"  (1.753 m), weight 98.431 kg (217 lb), SpO2 97.00%. Constitutional:   Patient is a well-developed and well-nourished *male in no acute distress and cooperative with exam. Head: Normocephalic and atraumatic Mouth: Mucus membranes moist Eyes: PERRL, EOMI, conjunctivae normal Ears: Brandt Darroff manuver was positive, dizziness worse on left side Neck: Supple, No Thyromegaly Cardiovascular: RRR, S1 normal, S2 normal Pulmonary/Chest: CTAB, no wheezes, rales, or rhonchi Abdominal: Soft. Non-tender, non-distended, bowel sounds are normal, no masses, organomegaly, or guarding present.  Neurological: A&O x3, Strenght is normal and symmetric bilaterally, cranial nerve II-XII are grossly intact, no focal motor deficit, sensory intact to light touch bilaterally.  Extremities : No Cyanosis, Clubbing or Edema   Labs on Admission:  Results for  orders placed during the hospital encounter of 03/28/13 (from the past 48 hour(Rodgers))  CBC WITH DIFFERENTIAL     Status: Abnormal   Collection Time    03/28/13  8:01 PM      Result Value Ref Range   WBC 5.8  4.0 - 10.5 K/uL   RBC 5.58  4.22 - 5.81 MIL/uL   Hemoglobin 15.4  13.0 - 17.0 g/dL   HCT 71.0   24.6 - 83.8 %   MCV 85.7  78.0 - 100.0 fL   MCH 27.6  26.0 - 34.0 pg   MCHC 32.2  30.0 - 36.0 g/dL   RDW 83.0  32.1 - 93.9 %   Platelets 209  150 - 400 K/uL   Neutrophils Relative % 81 (*) 43 - 77 %   Neutro Abs 4.7  1.7 - 7.7 K/uL   Lymphocytes Relative 11 (*) 12 - 46 %   Lymphs Abs 0.6 (*) 0.7 - 4.0 K/uL   Monocytes Relative 7  3 - 12 %   Monocytes Absolute 0.4  0.1 - 1.0 K/uL   Eosinophils Relative 0  0 - 5 %   Eosinophils Absolute 0.0  0.0 - 0.7 K/uL   Basophils Relative 1  0 - 1 %   Basophils Absolute 0.0  0.0 - 0.1 K/uL  COMPREHENSIVE METABOLIC PANEL     Status: Abnormal   Collection Time    03/28/13  8:01 PM      Result Value Ref Range   Sodium 138  137 - 147 mEq/L   Potassium 3.9  3.7 - 5.3 mEq/L   Chloride 99  96 - 112 mEq/L   CO2 28  19 - 32 mEq/L   Glucose, Bld 116 (*) 70 - 99 mg/dL   BUN 10  6 - 23 mg/dL   Creatinine, Ser 3.67  0.50 - 1.35 mg/dL   Calcium 9.7  8.4 - 22.8 mg/dL   Total Protein 9.1 (*) 6.0 - 8.3 g/dL   Albumin 4.7  3.5 - 5.2 g/dL   AST 18  0 - 37 U/L   ALT 19  0 - 53 U/L   Alkaline Phosphatase 82  39 - 117 U/L   Total Bilirubin 0.4  0.3 - 1.2 mg/dL   GFR calc non Af Amer >90  >90 mL/min   GFR calc Af Amer >90  >90 mL/min   Comment: (NOTE)     The eGFR has been calculated using the CKD EPI equation.     This calculation has not been validated in all clinical situations.     eGFR'Rodgers persistently <90 mL/min signify possible Chronic Kidney     Disease.  TROPONIN I     Status: None   Collection Time    03/28/13  8:01 PM      Result Value Ref Range   Troponin I <0.30  <0.30 ng/mL   Comment:            Due to the release kinetics of cTnI,     a negative result within the first hours     of the onset of symptoms does not rule out     myocardial infarction with certainty.     If myocardial infarction is still suspected,     repeat the test at appropriate intervals.    Radiological Exams on Admission: Ct Head Wo Contrast  03/28/2013   CLINICAL  DATA:  Dizziness. Nausea and vomiting. Left neck pain. Hypertension.  EXAM: CT HEAD WITHOUT CONTRAST  CT CERVICAL SPINE WITHOUT CONTRAST  TECHNIQUE: Multidetector CT imaging of the head and cervical spine was performed following the standard protocol without intravenous contrast. Multiplanar CT image reconstructions of the cervical spine were also generated.  COMPARISON:  None.  FINDINGS: CT HEAD FINDINGS  Calcified 2 cm left parafalcine mildly hyperdense mass. No associated vasogenic edema in the left frontal lobe. Mass appears to be extra-axial and based along the falx.  Otherwise, the brainstem, cerebellum, cerebral peduncles, thalamus, basal ganglia, basilar cisterns, and ventricular system appear within normal limits. No acute CVA or intracranial hemorrhage is observed.  CT CERVICAL SPINE FINDINGS  Cervical spondylosis with facet degenerative arthropathy particularly at C5-6 and C6-7, likely causing slight osseous foraminal stenosis on the left at these 2 levels. Probable hemangioma eccentric to the right in the C5 vertebra.  No cervical spine fracture or acute subluxation is identified.  IMPRESSION: 1. 2 cm left parafalcine hyperdense mass with associated calcification. I strongly favor a parafalcine meningioma. This could be further characterized with a followup nonemergent MRI of the brain, if clinically warranted. 2. Cervical spondylosis.   Electronically Signed   By: Sherryl Barters M.D.   On: 03/28/2013 21:05   Ct Cervical Spine Wo Contrast  03/28/2013   CLINICAL DATA:  Dizziness. Nausea and vomiting. Left neck pain. Hypertension.  EXAM: CT HEAD WITHOUT CONTRAST  CT CERVICAL SPINE WITHOUT CONTRAST  TECHNIQUE: Multidetector CT imaging of the head and cervical spine was performed following the standard protocol without intravenous contrast. Multiplanar CT image reconstructions of the cervical spine were also generated.  COMPARISON:  None.  FINDINGS: CT HEAD FINDINGS  Calcified 2 cm left parafalcine  mildly hyperdense mass. No associated vasogenic edema in the left frontal lobe. Mass appears to be extra-axial and based along the falx.  Otherwise, the brainstem, cerebellum, cerebral peduncles, thalamus, basal ganglia, basilar cisterns, and ventricular system appear within normal limits. No acute CVA or intracranial hemorrhage is observed.  CT CERVICAL SPINE FINDINGS  Cervical spondylosis with facet degenerative arthropathy particularly at C5-6 and C6-7, likely causing slight osseous foraminal stenosis on the left at these 2 levels. Probable hemangioma eccentric to the right in the C5 vertebra.  No cervical spine fracture or acute subluxation is identified.  IMPRESSION: 1. 2 cm left parafalcine hyperdense mass with associated calcification. I strongly favor a parafalcine meningioma. This could be further characterized with a followup nonemergent MRI of the brain, if clinically warranted. 2. Cervical spondylosis.   Electronically Signed   By: Sherryl Barters M.D.   On: 03/28/2013 21:05    Assessment/Plan Principal Problem:   Vertigo Active Problems:   CHF (congestive heart failure)   Dizziness  BPPV Likely the cause of dizziness, will start meclizine 25 mg by mouth 3 times a day Will also consult PT for vestibular PT in a.m.  ? Parafalcine meningioma CT head shows parafalcine mass with calcification, with suspicion for meningioma. Will obtain MRI brain a.m. Consult neurology in a.m. I doubt this is causing the patient'Rodgers symptoms  Cervical spondylosis CT cervical spine shows cervical spondylosis, degenerative arthropathy at C5-6 and C6-7 likely causing slight osseous foraminal stenosis on the left at these 2 levels. Will require neurosurgical evaluation as outpatient  Hypertension Continue amlodipine.  Prolonged QT interval Has mild prolonged QT interval and nonspecific T wave changes on EKG. We'll obtain 3 sets of cardiac enzymes. Will monitor on telemetry  Code status: Patient is  full code  Family discussion: Discussed with patient'Rodgers wife at bedside   Time  Spent on Admission: 60 minutes  Edward Rodgers [facility] Pager: [phone] 03/29/2013, 12:07 AM  If 7PM-7AM, please contact night-coverage  [email or web address]  Password [email or web address]

## 2013-03-30 DIAGNOSIS — I517 Cardiomegaly: Secondary | ICD-10-CM

## 2013-03-30 LAB — LIPID PANEL
Cholesterol: 178 mg/dL (ref 0–200)
HDL: 46 mg/dL (ref >39)
LDL CALC: 112 mg/dL — AB (ref 0–99)
Total CHOL/HDL Ratio: 3.9 RATIO
Triglycerides: 101 mg/dL (ref <150)
VLDL: 20 mg/dL (ref 0–40)

## 2013-03-30 LAB — HEMOGLOBIN A1C
HEMOGLOBIN A1C: 5.8 % — AB (ref <5.7)
Mean Plasma Glucose: 120 mg/dL — ABNORMAL HIGH (ref <117)

## 2013-03-30 NOTE — Progress Notes (Signed)
Subjective: The patient continues to have some dizziness. He had episodes of vomiting CT of the head showed mass consistent with meningioma MRI of brain questionable circulation infarct is being treated for right cerebellar infarct carotid Doppler and 2-D echo have been ordered patient has uncontrolled hypertension this is being treated patient also has CHF with ejection fraction 40%  Objective: Vital signs in last 24 hours: Temp:  [98 F (36.7 C)-99.2 F (37.3 C)] 98.6 F (37 C) (03/18 0400) Resp:  [15-33] 16 (03/18 0400) BP: (137-169)/(83-108) 148/93 mmHg (03/18 0400) Weight:  [94.5 kg (208 lb 5.4 oz)] 94.5 kg (208 lb 5.4 oz) (03/18 0500) Weight change: -3.93 kg (-8 lb 10.6 oz) Last BM Date: 03/28/13  Intake/Output from previous day: 03/17 0701 - 03/18 0700 In: 1451.3 [P.O.:240; I.V.:1211.3] Out: 3325 [NTIRW:4315] Intake/Output this shift: Total I/O In: 701.3 [P.O.:240; I.V.:461.3] Out: 2100 [Urine:2100]  Physical Exam: Gen. patient alert in no acute distress  HEENT negative  Neck supple no JVD or thyroid abnormalities  Heart regular rhythm no murmurs  Abdomen no palpable organs or masses  Extremities free of edema   Recent Labs  03/28/13 2001 03/29/13 0711  WBC 5.8 6.2  HGB 15.4 14.6  HCT 47.8 45.1  PLT 209 199   BMET  Recent Labs  03/28/13 2001 03/29/13 0711  NA 138 140  K 3.9 4.0  CL 99 103  CO2 28 28  GLUCOSE 116* 103*  BUN 10 8  CREATININE 0.71 0.72  CALCIUM 9.7 9.2    Studies/Results: Ct Head Wo Contrast  03/28/2013   CLINICAL DATA:  Dizziness. Nausea and vomiting. Left neck pain. Hypertension.  EXAM: CT HEAD WITHOUT CONTRAST  CT CERVICAL SPINE WITHOUT CONTRAST  TECHNIQUE: Multidetector CT imaging of the head and cervical spine was performed following the standard protocol without intravenous contrast. Multiplanar CT image reconstructions of the cervical spine were also generated.  COMPARISON:  None.  FINDINGS: CT HEAD FINDINGS  Calcified 2 cm  left parafalcine mildly hyperdense mass. No associated vasogenic edema in the left frontal lobe. Mass appears to be extra-axial and based along the falx.  Otherwise, the brainstem, cerebellum, cerebral peduncles, thalamus, basal ganglia, basilar cisterns, and ventricular system appear within normal limits. No acute CVA or intracranial hemorrhage is observed.  CT CERVICAL SPINE FINDINGS  Cervical spondylosis with facet degenerative arthropathy particularly at C5-6 and C6-7, likely causing slight osseous foraminal stenosis on the left at these 2 levels. Probable hemangioma eccentric to the right in the C5 vertebra.  No cervical spine fracture or acute subluxation is identified.  IMPRESSION: 1. 2 cm left parafalcine hyperdense mass with associated calcification. I strongly favor a parafalcine meningioma. This could be further characterized with a followup nonemergent MRI of the brain, if clinically warranted. 2. Cervical spondylosis.   Electronically Signed   By: Sherryl Barters M.D.   On: 03/28/2013 21:05   Ct Cervical Spine Wo Contrast  03/28/2013   CLINICAL DATA:  Dizziness. Nausea and vomiting. Left neck pain. Hypertension.  EXAM: CT HEAD WITHOUT CONTRAST  CT CERVICAL SPINE WITHOUT CONTRAST  TECHNIQUE: Multidetector CT imaging of the head and cervical spine was performed following the standard protocol without intravenous contrast. Multiplanar CT image reconstructions of the cervical spine were also generated.  COMPARISON:  None.  FINDINGS: CT HEAD FINDINGS  Calcified 2 cm left parafalcine mildly hyperdense mass. No associated vasogenic edema in the left frontal lobe. Mass appears to be extra-axial and based along the falx.  Otherwise, the brainstem, cerebellum, cerebral  peduncles, thalamus, basal ganglia, basilar cisterns, and ventricular system appear within normal limits. No acute CVA or intracranial hemorrhage is observed.  CT CERVICAL SPINE FINDINGS  Cervical spondylosis with facet degenerative arthropathy  particularly at C5-6 and C6-7, likely causing slight osseous foraminal stenosis on the left at these 2 levels. Probable hemangioma eccentric to the right in the C5 vertebra.  No cervical spine fracture or acute subluxation is identified.  IMPRESSION: 1. 2 cm left parafalcine hyperdense mass with associated calcification. I strongly favor a parafalcine meningioma. This could be further characterized with a followup nonemergent MRI of the brain, if clinically warranted. 2. Cervical spondylosis.   Electronically Signed   By: Sherryl Barters M.D.   On: 03/28/2013 21:05   Mr Brain Wo Contrast  03/29/2013   CLINICAL DATA:  Possible meningioma on head CT. Weakness and dizziness.  EXAM: MRI HEAD WITHOUT CONTRAST  TECHNIQUE: Multiplanar, multiecho pulse sequences of the brain and surrounding structures were obtained without intravenous contrast.  COMPARISON:  Head CT 03/28/2013  FINDINGS: There is a 4 mm acute infarct in the inferior right cerebellar hemisphere. There is no evidence of intracranial hemorrhage. There is a 2.2 x 2.1 cm left frontal parafalcine extra-axial mass. There is no edema in the surrounding brain parenchyma. Minimal, age-related chronic microvascular ischemic change is noted. Mild age-related cerebral atrophy is present. There is no midline shift or extra-axial fluid collection.  Prior right cataract surgery noted. Paranasal sinuses and mastoid air cells are clear. Major intracranial vascular flow voids are preserved.  IMPRESSION: 1. Punctate, acute right cerebellar infarct. 2. Left frontal parafalcine mass, consistent with a meningioma.   Electronically Signed   By: Logan Bores   On: 03/29/2013 11:21   US Carotid Duplex Bilateral  03/29/2013   CLINICAL DATA:  Near syncopal episode, history hypertension  EXAM: BILATERAL CAROTID DUPLEX ULTRASOUND  TECHNIQUE: Pearline Cables scale imaging, color Doppler and duplex ultrasound were performed of bilateral carotid and vertebral arteries in the neck.  COMPARISON:   MR HEAD W/O CM dated 03/29/2013  FINDINGS: Criteria: Quantification of carotid stenosis is based on velocity parameters that correlate the residual internal carotid diameter with NASCET-based stenosis levels, using the diameter of the distal internal carotid lumen as the denominator for stenosis measurement.  The following velocity measurements were obtained:  RIGHT  ICA:  43/20 cm/sec  CCA:  44/03 cm/sec  SYSTOLIC ICA/CCA RATIO:  1.2  DIASTOLIC ICA/CCA RATIO:  1.2  ECA:  63 cm/sec  LEFT  ICA:  54/27 cm/sec  CCA:  47/42 cm/sec  SYSTOLIC ICA/CCA RATIO:  5.95  DIASTOLIC ICA/CCA RATIO:  6.38  ECA:  57 cm/sec  RIGHT CAROTID ARTERY: There is a minimal amount of eccentric echogenic plaque within the right carotid bulb (image 20) extending to involve the origin and proximal aspects of the right internal carotid artery (images 27, 28 and 8), not resulting in elevated peak systolic velocities within the interrogated course to the right internal carotid artery to suggest a hemodynamically significant stenosis.  RIGHT VERTEBRAL ARTERY:  Antegrade flow  LEFT CAROTID ARTERY: The left common carotid artery is noted to be mildly tortuous. There is a minimal amount of eccentric mixed echogenic partially shadowing plaque within the distal aspect of the left common carotid artery (image 56) extending to involve the carotid bulb (image 60). There is a minimal amount of eccentric mixed echogenic partially shadowing plaque involving the origin proximal aspect of the left internal carotid artery (image 68), not resulting in elevated peak systolic velocities  within the interrogated course of the left internal carotid artery to suggest a hemodynamically significant stenosis.  LEFT VERTEBRAL ARTERY:  Antegrade flow  IMPRESSION: Bilateral atherosclerotic plaque, not resulting in a hemodynamically significant stenosis.   Electronically Signed   By: Sandi Mariscal M.D.   On: 03/29/2013 17:31    Medications:  . allopurinol  300 mg Oral q  morning - 10a  . amLODipine  10 mg Oral q morning - 10a  . aspirin  325 mg Oral Daily  . atorvastatin  20 mg Oral q1800  . darifenacin  7.5 mg Oral Daily  . enoxaparin (LOVENOX) injection  40 mg Subcutaneous QHS  . meclizine  25 mg Oral TID    . sodium chloride 75 mL/hr at 03/29/13 2022     Assessment/Plan: 1. Right cerebellar infarct to continue IV fluids continue control of blood pressure  2. Meningioma and follow as an outpatient  History CHF with global hypokinesis ejection fraction 40% to obtain cardiology consult   LOS: 2 days   Carma Dwiggins G 03/30/2013, 6:43 AM

## 2013-03-30 NOTE — Evaluation (Signed)
Occupational Therapy Evaluation Patient Details Name: Edward Rodgers MRN: [id number] DOB: 04/18/1950 Today's Date: 03/30/2013 Time: 3570-1779 OT Time Calculation (min): 10 min  OT Assessment / Plan / Recommendation History of present illness 63 year old male who   has a past medical history of Hypertension; Gout; CHF (congestive heart failure); Cancer (2002); and Thyroid disease. Pt arrived with episode of dizziness.   Clinical Impression   Pt is presenting to acute OT services after recent episode of dizziness, with accompanied nausea and vomiting.  Pt is currently not displaying any symptoms of dizziness and is not having any difficulties or concerns with ADL or IADL status.  Pt does not need further OT services. Pt to be d/c-ed for OT.    OT Assessment  Patient does not need any further OT services    Follow Up Recommendations  No OT follow up       Equipment Recommendations  None recommended by OT          Precautions / Restrictions Precautions Precautions: None Restrictions Weight Bearing Restrictions: No   Pertinent Vitals/Pain No pain stated.    ADL  Eating/Feeding: Independent Grooming: Independent Lower Body Dressing: Independent Where Assessed - Lower Body Dressing: Unsupported sitting ADL Comments: Pt was independent prior to admission and is currenlty demonstrating no ADL deficits. Pt did not have episoe of dizziness with bending over to don/doff socks.     OT Goals Acute Rehab OT Goals Patient Stated Goal: To go home; find out what is causing the dizzy spells (No goals needed, pt to be d/c-ed from OT services)  Visit Information  Last OT Received On: 03/30/13 History of Present Illness: 63 year old male who   has a past medical history of Hypertension; Gout; CHF (congestive heart failure); Cancer (2002); and Thyroid disease. Pt arrived with episode of dizziness.       Prior [place] expects to be discharged to::  Private residence Living Arrangements: Spouse/significant other Available Help at Discharge: Family Type of Home: House Home Access: Stairs to enter [employer] of Steps: 3 Entrance Stairs-Rails: Right Home Layout: Two level;Laundry or work area in Building surveyor of Steps: 10 Alternate Level Stairs-Rails: None Home Equipment: None Prior Function Level of Independence: Independent Communication Communication: No difficulties Dominant Hand: Right     Vision/Perception Vision - History Baseline Vision: Wears glasses only for reading Visual History: Cataracts (Pt has hx cataracts in right eye) Patient Visual Report: No change from baseline Vision - Assessment Eye Alignment: Within Functional Limits   Cognition  Cognition Arousal/Alertness: Awake/alert Behavior During Therapy: WFL for tasks assessed/performed Overall Cognitive Status: Within Functional Limits for tasks assessed    Extremity/Trunk Assessment Upper Extremity Assessment Upper Extremity Assessment: Overall WFL for tasks assessed Lower Extremity Assessment Lower Extremity Assessment: Defer to PT evaluation Cervical / Trunk Assessment Cervical / Trunk Assessment: Normal     Mobility Bed Mobility Overal bed mobility: Independent Transfers Overall transfer level: Independent Equipment used: None         End of Session OT - End of Session Activity Tolerance: Patient tolerated treatment well Patient left: in chair;with nursing/sitter in room  Tyler, MS, OTR/L [phone]  03/30/2013, 1:22 PM

## 2013-03-30 NOTE — Evaluation (Deleted)
Physical Therapy Evaluation Patient Details Name: Edward Rodgers MRN: [id number] DOB: Jan 06, 1951 Today's Date: 03/30/2013 Time:  905- 7    PT Assessment / Plan / Recommendation History of Present Illness  63 year old male who   has a past medical history of Hypertension; Gout; CHF (congestive heart failure); Cancer (2002); and Thyroid disease.  Clinical Impression  Pt is a 63 yo admitted with vertigo.  Pt states that he does not really pay much attention to when he has his dizzy spells as they are not frequent and they do not last long.  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  Pt may have benign positional vertigo; if MD feels this is the case pt may benefit from outpatient therapy.  At this time pt denies vertigo; turning head Rt; Lt, up and down does not present sx.     PT Assessment  Patent does not need any further PT services    Follow Up Recommendations  No PT follow up    Does the patient have the potential to tolerate intense rehabilitation    N/A  Barriers to Discharge  none      Equipment Recommendations  None recommended by PT    Recommendations for Other Services   None  Frequency  N/A    Precautions / Restrictions Precautions Precautions: None Restrictions Weight Bearing Restrictions: No   Pertinent Vitals/Pain none      Mobility  Bed Mobility Overal bed mobility: Independent Transfers Overall transfer level: Independent Equipment used: None Ambulation/Gait Ambulation/Gait assistance: Independent Ambulation Distance (Feet): 380 Feet Assistive device: None Gait Pattern/deviations: WFL(Within Functional Limits) Gait velocity interpretation: <1.8 ft/sec, indicative of risk for recurrent falls          PT Goals(Current goals can be found in the care plan section) Acute Rehab PT Goals Patient Stated Goal: To go home; find out what is causing the dizzy spells PT Goal Formulation: No goals set, d/c therapy  Visit Information  Last PT  Received On: 03/30/13 History of Present Illness: 63 year old male who   has a past medical history of Hypertension; Gout; CHF (congestive heart failure); Cancer (2002); and Thyroid disease.       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 of Steps: 10 Alternate Level Stairs-Rails: None Home Equipment: None Prior Function Level of Independence: Independent Communication Communication: No difficulties Dominant Hand: Right    Cognition  Cognition Arousal/Alertness: Awake/alert Behavior During Therapy: WFL for tasks assessed/performed Overall Cognitive Status: Within Functional Limits for tasks assessed    Extremity/Trunk Assessment Lower Extremity Assessment Lower Extremity Assessment: Overall WFL for tasks assessed   Balance Balance Overall balance assessment: Independent  End of Session PT - End of Session Equipment Utilized During Treatment: Gait belt Patient left: in chair Nurse Communication: Mobility status  GP     Edward Rodgers,CINDY 03/30/2013, 9:18 AM

## 2013-03-30 NOTE — Progress Notes (Signed)
Patient ID: Edward Rodgers, male   DOB: [birth year], 63 y.o.   MRN: [id number]  [facility] NEUROLOGY Chelcey Caputo A. Gerilyn Pilgrim, MD     [email or web address]          Edward Rodgers is an 63 y.o. male.   Assessment/Plan: 1. Acute onset of vertigo along with nausea and vomiting. 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 The patient's will be placed on 81 mg aspirin. Blood pressure control is recommended. Also consider that a statin given that his LDL is 112. Workup is almost complete. A echo is pending. 2. Small left frontal meningioma. This is an asymptomatic incidental finding. We recommended annual surveillance with MRI with and without contrast.  He reports no recurrent episodes of gait ataxia, vertigo or dizziness. He reports that he has been up ambulating without any problems.   GENERAL: This a pleasant average weight man in no acute distress.  HEENT: Supple. Atraumatic normocephalic. There is a right exotropia.  ABDOMEN: soft  EXTREMITIES: No edema  BACK: Normal.  SKIN: Normal by inspection.  MENTAL STATUS: Alert and oriented. Speech, language and cognition are generally intact. Judgment and insight normal.  CRANIAL NERVES: Pupils are equal, round and reactive to light and accommodation; extra ocular movements are full, there is no significant nystagmus; visual fields are full; upper and lower facial muscles are normal in strength and symmetric, there is Mild flattening of the nasolabial fold On the right side; tongue is midline; uvula is midline; shoulder elevation is normal.  MOTOR: Normal tone, bulk and strength; no pronator drift.  COORDINATION: Left finger to nose is normal, right finger to nose is normal, No rest tremor; no intention tremor; no postural tremor; no bradykinesia. Additionally, rapid alternating movements are normal bilaterally    The patient's brain MRI is reviewed in person. There is a tiny increased signal seen  on diffusion imaging involving the inferior right cerebellum. This is a lacunar infarct. There is also a homogeneous small left frontal mass that is extra-axial and well-circumscribed. The characteristics are typical of a meningioma.       Objective: Vital signs in last 24 hours: Temp:  [98 F (36.7 C)-99.2 F (37.3 C)] 98.7 F (37.1 C) (03/18 0730) Resp:  [15-33] 24 (03/18 0800) BP: (137-176)/(82-119) 171/110 mmHg (03/18 0912) Weight:  [94.5 kg (208 lb 5.4 oz)] 94.5 kg (208 lb 5.4 oz) (03/18 0500)  Intake/Output from previous day: 03/17 0701 - 03/18 0700 In: 1451.3 [P.O.:240; I.V.:1211.3] Out: 3325 [Urine:3325] Intake/Output this shift: Total I/O In: 525 [I.V.:525] Out: 800 [Urine:800] Nutritional status: Cardiac   Lab Results: Results for orders placed during the hospital encounter of 03/28/13 (from the past 48 hour(s))  CBC WITH DIFFERENTIAL     Status: Abnormal   Collection Time    03/28/13  8:01 PM      Result Value Ref Range   WBC 5.8  4.0 - 10.5 K/uL   RBC 5.58  4.22 - 5.81 MIL/uL   Hemoglobin 15.4  13.0 - 17.0 g/dL   HCT 12.7  97.5 - 38.7 %   MCV 85.7  78.0 - 100.0 fL   MCH 27.6  26.0 - 34.0 pg   MCHC 32.2  30.0 - 36.0 g/dL   RDW 23.5  75.0 - 26.8 %   Platelets 209  150 - 400 K/uL   Neutrophils Relative % 81 (*) 43 - 77 %   Neutro Abs 4.7  1.7 - 7.7 K/uL   Lymphocytes  Relative 11 (*) 12 - 46 %   Lymphs Abs 0.6 (*) 0.7 - 4.0 K/uL   Monocytes Relative 7  3 - 12 %   Monocytes Absolute 0.4  0.1 - 1.0 K/uL   Eosinophils Relative 0  0 - 5 %   Eosinophils Absolute 0.0  0.0 - 0.7 K/uL   Basophils Relative 1  0 - 1 %   Basophils Absolute 0.0  0.0 - 0.1 K/uL  COMPREHENSIVE METABOLIC PANEL     Status: Abnormal   Collection Time    03/28/13  8:01 PM      Result Value Ref Range   Sodium 138  137 - 147 mEq/L   Potassium 3.9  3.7 - 5.3 mEq/L   Chloride 99  96 - 112 mEq/L   CO2 28  19 - 32 mEq/L   Glucose, Bld 116 (*) 70 - 99 mg/dL   BUN 10  6 - 23 mg/dL    Creatinine, Ser 0.71  0.50 - 1.35 mg/dL   Calcium 9.7  8.4 - 10.5 mg/dL   Total Protein 9.1 (*) 6.0 - 8.3 g/dL   Albumin 4.7  3.5 - 5.2 g/dL   AST 18  0 - 37 U/L   ALT 19  0 - 53 U/L   Alkaline Phosphatase 82  39 - 117 U/L   Total Bilirubin 0.4  0.3 - 1.2 mg/dL   GFR calc non Af Amer >90  >90 mL/min   GFR calc Af Amer >90  >90 mL/min   Comment: (NOTE)     The eGFR has been calculated using the CKD EPI equation.     This calculation has not been validated in all clinical situations.     eGFR's persistently <90 mL/min signify possible Chronic Kidney     Disease.  TROPONIN I     Status: None   Collection Time    03/28/13  8:01 PM      Result Value Ref Range   Troponin I <0.30  <0.30 ng/mL   Comment:            Due to the release kinetics of cTnI,     a negative result within the first hours     of the onset of symptoms does not rule out     myocardial infarction with certainty.     If myocardial infarction is still suspected,     repeat the test at appropriate intervals.  MRSA PCR SCREENING     Status: None   Collection Time    03/29/13  1:00 AM      Result Value Ref Range   MRSA by PCR NEGATIVE  NEGATIVE   Comment:            The GeneXpert MRSA Assay (FDA     approved for NASAL specimens     only), is one component of a     comprehensive MRSA colonization     surveillance program. It is not     intended to diagnose MRSA     infection nor to guide or     monitor treatment for     MRSA infections.  TROPONIN I     Status: None   Collection Time    03/29/13  1:21 AM      Result Value Ref Range   Troponin I <0.30  <0.30 ng/mL   Comment:            Due to the release kinetics of cTnI,  a negative result within the first hours     of the onset of symptoms does not rule out     myocardial infarction with certainty.     If myocardial infarction is still suspected,     repeat the test at appropriate intervals.  TROPONIN I     Status: None   Collection Time    03/29/13   7:11 AM      Result Value Ref Range   Troponin I <0.30  <0.30 ng/mL   Comment:            Due to the release kinetics of cTnI,     a negative result within the first hours     of the onset of symptoms does not rule out     myocardial infarction with certainty.     If myocardial infarction is still suspected,     repeat the test at appropriate intervals.  CBC     Status: None   Collection Time    03/29/13  7:11 AM      Result Value Ref Range   WBC 6.2  4.0 - 10.5 K/uL   RBC 5.28  4.22 - 5.81 MIL/uL   Hemoglobin 14.6  13.0 - 17.0 g/dL   HCT 45.1  39.0 - 52.0 %   MCV 85.4  78.0 - 100.0 fL   MCH 27.7  26.0 - 34.0 pg   MCHC 32.4  30.0 - 36.0 g/dL   RDW 14.3  11.5 - 15.5 %   Platelets 199  150 - 400 K/uL  COMPREHENSIVE METABOLIC PANEL     Status: Abnormal   Collection Time    03/29/13  7:11 AM      Result Value Ref Range   Sodium 140  137 - 147 mEq/L   Potassium 4.0  3.7 - 5.3 mEq/L   Chloride 103  96 - 112 mEq/L   CO2 28  19 - 32 mEq/L   Glucose, Bld 103 (*) 70 - 99 mg/dL   BUN 8  6 - 23 mg/dL   Creatinine, Ser 0.72  0.50 - 1.35 mg/dL   Calcium 9.2  8.4 - 10.5 mg/dL   Total Protein 7.4  6.0 - 8.3 g/dL   Albumin 3.7  3.5 - 5.2 g/dL   AST 14  0 - 37 U/L   ALT 14  0 - 53 U/L   Alkaline Phosphatase 70  39 - 117 U/L   Total Bilirubin 0.4  0.3 - 1.2 mg/dL   GFR calc non Af Amer >90  >90 mL/min   GFR calc Af Amer >90  >90 mL/min   Comment: (NOTE)     The eGFR has been calculated using the CKD EPI equation.     This calculation has not been validated in all clinical situations.     eGFR's persistently <90 mL/min signify possible Chronic Kidney     Disease.  TROPONIN I     Status: None   Collection Time    03/29/13  1:02 PM      Result Value Ref Range   Troponin I <0.30  <0.30 ng/mL   Comment:            Due to the release kinetics of cTnI,     a negative result within the first hours     of the onset of symptoms does not rule out     myocardial infarction with certainty.       If myocardial infarction is still  suspected,     repeat the test at appropriate intervals.  LIPID PANEL     Status: Abnormal   Collection Time    03/30/13  4:41 AM      Result Value Ref Range   Cholesterol 178  0 - 200 mg/dL   Triglycerides 101  <150 mg/dL   HDL 46  >39 mg/dL   Total CHOL/HDL Ratio 3.9     VLDL 20  0 - 40 mg/dL   LDL Cholesterol 112 (*) 0 - 99 mg/dL   Comment:            Total Cholesterol/HDL:CHD Risk     Coronary Heart Disease Risk Table                         Men   Women      1/2 Average Risk   3.4   3.3      Average Risk       5.0   4.4      2 X Average Risk   9.6   7.1      3 X Average Risk  23.4   11.0                Use the calculated Patient Ratio     above and the CHD Risk Table     to determine the patient's CHD Risk.                ATP III CLASSIFICATION (LDL):      <100     mg/dL   Optimal      100-129  mg/dL   Near or Above                        Optimal      130-159  mg/dL   Borderline      160-189  mg/dL   High      >190     mg/dL   Very High    Lipid Panel  Recent Labs  03/30/13 0441  CHOL 178  TRIG 101  HDL 46  CHOLHDL 3.9  VLDL 20  LDLCALC 112*    Studies/Results: Ct Head Wo Contrast  03/28/2013   CLINICAL DATA:  Dizziness. Nausea and vomiting. Left neck pain. Hypertension.  EXAM: CT HEAD WITHOUT CONTRAST  CT CERVICAL SPINE WITHOUT CONTRAST  TECHNIQUE: Multidetector CT imaging of the head and cervical spine was performed following the standard protocol without intravenous contrast. Multiplanar CT image reconstructions of the cervical spine were also generated.  COMPARISON:  None.  FINDINGS: CT HEAD FINDINGS  Calcified 2 cm left parafalcine mildly hyperdense mass. No associated vasogenic edema in the left frontal lobe. Mass appears to be extra-axial and based along the falx.  Otherwise, the brainstem, cerebellum, cerebral peduncles, thalamus, basal ganglia, basilar cisterns, and ventricular system appear within normal limits. No  acute CVA or intracranial hemorrhage is observed.  CT CERVICAL SPINE FINDINGS  Cervical spondylosis with facet degenerative arthropathy particularly at C5-6 and C6-7, likely causing slight osseous foraminal stenosis on the left at these 2 levels. Probable hemangioma eccentric to the right in the C5 vertebra.  No cervical spine fracture or acute subluxation is identified.  IMPRESSION: 1. 2 cm left parafalcine hyperdense mass with associated calcification. I strongly favor a parafalcine meningioma. This could be further characterized with a followup nonemergent MRI of the brain, if clinically warranted. 2. Cervical spondylosis.  Electronically Signed   By: Sherryl Barters M.D.   On: 03/28/2013 21:05   Ct Cervical Spine Wo Contrast  03/28/2013   CLINICAL DATA:  Dizziness. Nausea and vomiting. Left neck pain. Hypertension.  EXAM: CT HEAD WITHOUT CONTRAST  CT CERVICAL SPINE WITHOUT CONTRAST  TECHNIQUE: Multidetector CT imaging of the head and cervical spine was performed following the standard protocol without intravenous contrast. Multiplanar CT image reconstructions of the cervical spine were also generated.  COMPARISON:  None.  FINDINGS: CT HEAD FINDINGS  Calcified 2 cm left parafalcine mildly hyperdense mass. No associated vasogenic edema in the left frontal lobe. Mass appears to be extra-axial and based along the falx.  Otherwise, the brainstem, cerebellum, cerebral peduncles, thalamus, basal ganglia, basilar cisterns, and ventricular system appear within normal limits. No acute CVA or intracranial hemorrhage is observed.  CT CERVICAL SPINE FINDINGS  Cervical spondylosis with facet degenerative arthropathy particularly at C5-6 and C6-7, likely causing slight osseous foraminal stenosis on the left at these 2 levels. Probable hemangioma eccentric to the right in the C5 vertebra.  No cervical spine fracture or acute subluxation is identified.  IMPRESSION: 1. 2 cm left parafalcine hyperdense mass with associated  calcification. I strongly favor a parafalcine meningioma. This could be further characterized with a followup nonemergent MRI of the brain, if clinically warranted. 2. Cervical spondylosis.   Electronically Signed   By: Sherryl Barters M.D.   On: 03/28/2013 21:05   Mr Brain Wo Contrast  03/29/2013   CLINICAL DATA:  Possible meningioma on head CT. Weakness and dizziness.  EXAM: MRI HEAD WITHOUT CONTRAST  TECHNIQUE: Multiplanar, multiecho pulse sequences of the brain and surrounding structures were obtained without intravenous contrast.  COMPARISON:  Head CT 03/28/2013  FINDINGS: There is a 4 mm acute infarct in the inferior right cerebellar hemisphere. There is no evidence of intracranial hemorrhage. There is a 2.2 x 2.1 cm left frontal parafalcine extra-axial mass. There is no edema in the surrounding brain parenchyma. Minimal, age-related chronic microvascular ischemic change is noted. Mild age-related cerebral atrophy is present. There is no midline shift or extra-axial fluid collection.  Prior right cataract surgery noted. Paranasal sinuses and mastoid air cells are clear. Major intracranial vascular flow voids are preserved.  IMPRESSION: 1. Punctate, acute right cerebellar infarct. 2. Left frontal parafalcine mass, consistent with a meningioma.   Electronically Signed   By: Logan Bores   On: 03/29/2013 11:21   US Carotid Duplex Bilateral  03/29/2013   CLINICAL DATA:  Near syncopal episode, history hypertension  EXAM: BILATERAL CAROTID DUPLEX ULTRASOUND  TECHNIQUE: Pearline Cables scale imaging, color Doppler and duplex ultrasound were performed of bilateral carotid and vertebral arteries in the neck.  COMPARISON:  MR HEAD W/O CM dated 03/29/2013  FINDINGS: Criteria: Quantification of carotid stenosis is based on velocity parameters that correlate the residual internal carotid diameter with NASCET-based stenosis levels, using the diameter of the distal internal carotid lumen as the denominator for stenosis  measurement.  The following velocity measurements were obtained:  RIGHT  ICA:  43/20 cm/sec  CCA:  34/19 cm/sec  SYSTOLIC ICA/CCA RATIO:  1.2  DIASTOLIC ICA/CCA RATIO:  1.2  ECA:  63 cm/sec  LEFT  ICA:  54/27 cm/sec  CCA:  62/22 cm/sec  SYSTOLIC ICA/CCA RATIO:  9.79  DIASTOLIC ICA/CCA RATIO:  8.92  ECA:  57 cm/sec  RIGHT CAROTID ARTERY: There is a minimal amount of eccentric echogenic plaque within the right carotid bulb (image 20) extending to involve the origin and  proximal aspects of the right internal carotid artery (images 27, 28 and 8), not resulting in elevated peak systolic velocities within the interrogated course to the right internal carotid artery to suggest a hemodynamically significant stenosis.  RIGHT VERTEBRAL ARTERY:  Antegrade flow  LEFT CAROTID ARTERY: The left common carotid artery is noted to be mildly tortuous. There is a minimal amount of eccentric mixed echogenic partially shadowing plaque within the distal aspect of the left common carotid artery (image 56) extending to involve the carotid bulb (image 60). There is a minimal amount of eccentric mixed echogenic partially shadowing plaque involving the origin proximal aspect of the left internal carotid artery (image 68), not resulting in elevated peak systolic velocities within the interrogated course of the left internal carotid artery to suggest a hemodynamically significant stenosis.  LEFT VERTEBRAL ARTERY:  Antegrade flow  IMPRESSION: Bilateral atherosclerotic plaque, not resulting in a hemodynamically significant stenosis.   Electronically Signed   By: Sandi Mariscal M.D.   On: 03/29/2013 17:31    Medications:  Scheduled Meds: . allopurinol  300 mg Oral q morning - 10a  . amLODipine  10 mg Oral q morning - 10a  . aspirin  325 mg Oral Daily  . atorvastatin  20 mg Oral q1800  . darifenacin  7.5 mg Oral Daily  . enoxaparin (LOVENOX) injection  40 mg Subcutaneous QHS  . meclizine  25 mg Oral TID   Continuous Infusions: . sodium  chloride 75 mL/hr at 03/29/13 2022   PRN Meds:.ibuprofen, ondansetron (ZOFRAN) IV, ondansetron     LOS: 2 days   Beatris Belen A. Merlene Laughter, M.D.  Diplomate, Tax adviser of Psychiatry and Neurology ( Neurology).

## 2013-03-30 NOTE — Progress Notes (Signed)
OT Cancellation Note  Patient Details Name: Edward Rodgers MRN: [id number] DOB: Apr 05, 1950   Cancelled Treatment:    Reason Eval/Treat Not Completed: Patient at procedure or test/ unavailable (Pt receiving echocardiogram - will reattempt OT eval at later time/date)  Bea Graff, [place], OTR/L [phone]  03/30/2013, 8:36 AM

## 2013-03-30 NOTE — Progress Notes (Signed)
*  PRELIMINARY RESULTS* Echocardiogram 2D Echocardiogram has been performed.  [place], [place] 03/30/2013, 8:58 AM

## 2013-03-30 NOTE — Evaluation (Signed)
Physical Therapy Evaluation Patient Details Name: Edward Rodgers MRN: [id number] DOB: 1950-04-18 Today's Date: 03/30/2013 Time: 0910-0930 PT Time Calculation (min): 20 min  PT Assessment / Plan / Recommendation History of Present Illness  63 year old male who   has a past medical history of Hypertension; Gout; CHF (congestive heart failure); Cancer (2002); and Thyroid disease.  Clinical Impression  Pt appears to be at prior level of functioning at this time.  Turning head Rt, Lt up or down did not elicit sx of dizziness.     PT Assessment  Patent does not need any further PT services    Follow Up Recommendations  No PT follow up (unless MD feels dizziness is from benign positional vertigo then OP PT may be indicated for canalith reposisioning.)    Does the patient have the potential to tolerate intense rehabilitation    n/a  Barriers to Discharge  none      Equipment Recommendations  None recommended by PT    Recommendations for Other Services  none   Frequency   N/A   Precautions / Restrictions Precautions Precautions: None Restrictions Weight Bearing Restrictions: No   Pertinent Vitals/Pain none      Mobility  Bed Mobility Overal bed mobility: Independent Transfers Overall transfer level: Independent Equipment used: None Ambulation/Gait Ambulation/Gait assistance: Independent Ambulation Distance (Feet): 400 Feet Assistive device: None Gait Pattern/deviations: WFL(Within Functional Limits) Gait velocity interpretation: at or above normal speed for age/gender           PT Goals(Current goals can be found in the care plan section) Acute Rehab PT Goals Patient Stated Goal: To go home; find out what is causing the dizzy spells PT Goal Formulation: No goals set, d/c therapy  Visit Information  Last PT Received On: 03/30/13 History of Present Illness: 63 year old male who   has a past medical history of Hypertension; Gout; CHF (congestive heart failure);  Cancer (2002); and Thyroid disease.       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 of Steps: 10 Alternate Level Stairs-Rails: None Home Equipment: None Prior Function Level of Independence: Independent Communication Communication: No difficulties Dominant Hand: Right    Cognition  Cognition Arousal/Alertness: Awake/alert Behavior During Therapy: WFL for tasks assessed/performed Overall Cognitive Status: Within Functional Limits for tasks assessed    Extremity/Trunk Assessment Lower Extremity Assessment Lower Extremity Assessment: Overall WFL for tasks assessed Cervical / Trunk Assessment Cervical / Trunk Assessment: Normal   Balance Balance Overall balance assessment: Independent  End of Session PT - End of Session Equipment Utilized During Treatment: Gait belt Patient left: in chair Nurse Communication: Mobility status  GP     RUSSELL,CINDY 03/30/2013, 10:34 AM

## 2013-03-31 MED ORDER — STROKE: EARLY STAGES OF RECOVERY BOOK
Freq: Once | Status: AC
  Administered 2013-03-31: 08:00:00
  Filled 2013-03-31 (×2): qty 1

## 2013-03-31 MED ORDER — ATORVASTATIN CALCIUM 20 MG PO TABS: 20.0000 mg | ORAL_TABLET | Freq: Every day | ORAL | Status: DC

## 2013-03-31 MED ORDER — MECLIZINE HCL 25 MG PO TABS: 25.0000 mg | ORAL_TABLET | Freq: Three times a day (TID) | ORAL | Status: DC

## 2013-03-31 MED ORDER — ASPIRIN 325 MG PO TABS: 325.0000 mg | ORAL_TABLET | Freq: Every day | ORAL | Status: DC

## 2013-03-31 NOTE — Progress Notes (Signed)
03/31/13 1001 Reviewed discharge instructions with patient, wife at bedside. Given copy of AVS, prescriptions, f/u appointments scheduled. Noted when home medications next due on AVS. Discussed importance of taking medications as prescribed and monitoring BP. Wife states "i can check his blood pressure at home". Discussed stroke education, s/s of stroke, when to seek medical attention. Reviewed stroke education booklet with patient and wife. Verbalized understanding via teachback. IV site d/c'd this am per nursing student, site within normal limits. No c/o pain, dizziness, other discomfort at time of discharge. Pt left floor in stable condition via w/c accompanied by nurse tech. Donavan Foil, RN

## 2013-03-31 NOTE — Discharge Summary (Signed)
Physician Discharge Summary  Edward Rodgers VHQ:[id number] DOB: 07-25-1950 DOA: 03/28/2013  PCP: Lanette Hampshire, MD  Admit date: 03/28/2013 Discharge date: 03/31/2013   Follow-up Information   Follow up with [facility], KOFI, MD In 2 months.   Specialty:  Neurology   Contact information:   [address] [place] [address] [phone]       Discharge Diagnoses:  1. Acute right cerebellar infarction 2. Hypertension essential 3. Parafalcine meningioma 4. Systolic congestive heart failure with 40% ejection fraction 5. Cervical spondylosis 6. Hyperuricemia previous flares of gout 7.   Discharge Condition: Good Disposition: Home  Diet recommendation: Heart healthy  Filed Weights   03/28/13 1920 03/29/13 0500 03/30/13 0500  Weight: 98.431 kg (217 lb) 94.2 kg (207 lb 10.8 oz) 94.5 kg (208 lb 5.4 oz)    History of present illness:  The patient was admitted through the ED with history of dizziness vomiting and difficulty walking. He was found by CT of the head to have a 2 cm  parafalcine mass. He was started on intravenous fluids and subsequently admitted.  Hospital Course:   The patient remained in the  down unit in ICU. He was continued on intravenous fluids his blood pressure was slightly elevated he was started on Norvasc 10 mg daily continued on Lipitor 20 mg daily Zyloprim 300 mg daily and was started on aspirin 325 mg daily. The patient had MRI of brain which showed evidence of right cerebellar infarction. He did have neurological consultation and he did have carotid Doppler ultrasound which did not show significant evidence of stenosis. MRI of brain did show evidence of small area of infarction and cerebellum. 2-D echo showed ejection fraction of 35-40% with diffuse hypokinesis and grade 1 diastolic dysfunction. The patient was neurologically stable after 2 days and was moved to [place] floor it was felt that he could be discharged home and followed as an outpatient he  was ambulatory with no neurological symptoms   Discharge Instructions The patient is to followup with his primary care physician as well as neurologist and cardiologist Discharge Orders   Future Orders Complete By Expires   MR Brain W Wo Contrast  As directed 05/29/2014   Questions:     Does the patient have a pacemaker or implanted devices?:  No   Reason for Exam (SYMPTOM  OR DIAGNOSIS REQUIRED):  evaluate mass seen on ct   Preferred imaging location?:  [facility]       Medication List    TAKE these medications       meclizine 25 MG tablet  Commonly known as:  ANTIVERT  Take one every 6 hours for dizziness     ondansetron 4 MG disintegrating tablet  Commonly known as:  ZOFRAN ODT  4mg  ODT q4 hours prn nausea/vomit      ASK your doctor about these medications       allopurinol 300 MG tablet  Commonly known as:  ZYLOPRIM  Take 300 mg by mouth every morning.     amLODipine 10 MG tablet  Commonly known as:  NORVASC  Take 10 mg by mouth every morning.     aspirin EC 81 MG tablet  Take 81 mg by mouth as needed for mild pain or moderate pain (takes as needed for chest pain).     multivitamin with minerals Tabs tablet  Take 1 tablet by mouth daily.     solifenacin 5 MG tablet  Commonly known as:  VESICARE  Take 5 mg  by mouth every morning.       No Known Allergies  The results of significant diagnostics from this hospitalization (including imaging, microbiology, ancillary and laboratory) are listed below for reference.    Significant Diagnostic Studies: Ct Head Wo Contrast  03/28/2013   CLINICAL DATA:  Dizziness. Nausea and vomiting. Left neck pain. Hypertension.  EXAM: CT HEAD WITHOUT CONTRAST  CT CERVICAL SPINE WITHOUT CONTRAST  TECHNIQUE: Multidetector CT imaging of the head and cervical spine was performed following the standard protocol without intravenous contrast. Multiplanar CT image reconstructions of the cervical spine were also generated.   COMPARISON:  None.  FINDINGS: CT HEAD FINDINGS  Calcified 2 cm left parafalcine mildly hyperdense mass. No associated vasogenic edema in the left frontal lobe. Mass appears to be extra-axial and based along the falx.  Otherwise, the brainstem, cerebellum, cerebral peduncles, thalamus, basal ganglia, basilar cisterns, and ventricular system appear within normal limits. No acute CVA or intracranial hemorrhage is observed.  CT CERVICAL SPINE FINDINGS  Cervical spondylosis with facet degenerative arthropathy particularly at C5-6 and C6-7, likely causing slight osseous foraminal stenosis on the left at these 2 levels. Probable hemangioma eccentric to the right in the C5 vertebra.  No cervical spine fracture or acute subluxation is identified.  IMPRESSION: 1. 2 cm left parafalcine hyperdense mass with associated calcification. I strongly favor a parafalcine meningioma. This could be further characterized with a followup nonemergent MRI of the brain, if clinically warranted. 2. Cervical spondylosis.   Electronically Signed   By: Sherryl Barters M.D.   On: 03/28/2013 21:05   Ct Cervical Spine Wo Contrast  03/28/2013   CLINICAL DATA:  Dizziness. Nausea and vomiting. Left neck pain. Hypertension.  EXAM: CT HEAD WITHOUT CONTRAST  CT CERVICAL SPINE WITHOUT CONTRAST  TECHNIQUE: Multidetector CT imaging of the head and cervical spine was performed following the standard protocol without intravenous contrast. Multiplanar CT image reconstructions of the cervical spine were also generated.  COMPARISON:  None.  FINDINGS: CT HEAD FINDINGS  Calcified 2 cm left parafalcine mildly hyperdense mass. No associated vasogenic edema in the left frontal lobe. Mass appears to be extra-axial and based along the falx.  Otherwise, the brainstem, cerebellum, cerebral peduncles, thalamus, basal ganglia, basilar cisterns, and ventricular system appear within normal limits. No acute CVA or intracranial hemorrhage is observed.  CT CERVICAL SPINE  FINDINGS  Cervical spondylosis with facet degenerative arthropathy particularly at C5-6 and C6-7, likely causing slight osseous foraminal stenosis on the left at these 2 levels. Probable hemangioma eccentric to the right in the C5 vertebra.  No cervical spine fracture or acute subluxation is identified.  IMPRESSION: 1. 2 cm left parafalcine hyperdense mass with associated calcification. I strongly favor a parafalcine meningioma. This could be further characterized with a followup nonemergent MRI of the brain, if clinically warranted. 2. Cervical spondylosis.   Electronically Signed   By: Sherryl Barters M.D.   On: 03/28/2013 21:05   Mr Brain Wo Contrast  03/29/2013   CLINICAL DATA:  Possible meningioma on head CT. Weakness and dizziness.  EXAM: MRI HEAD WITHOUT CONTRAST  TECHNIQUE: Multiplanar, multiecho pulse sequences of the brain and surrounding structures were obtained without intravenous contrast.  COMPARISON:  Head CT 03/28/2013  FINDINGS: There is a 4 mm acute infarct in the inferior right cerebellar hemisphere. There is no evidence of intracranial hemorrhage. There is a 2.2 x 2.1 cm left frontal parafalcine extra-axial mass. There is no edema in the surrounding brain parenchyma. Minimal, age-related chronic microvascular ischemic  change is noted. Mild age-related cerebral atrophy is present. There is no midline shift or extra-axial fluid collection.  Prior right cataract surgery noted. Paranasal sinuses and mastoid air cells are clear. Major intracranial vascular flow voids are preserved.  IMPRESSION: 1. Punctate, acute right cerebellar infarct. 2. Left frontal parafalcine mass, consistent with a meningioma.   Electronically Signed   By: Logan Bores   On: 03/29/2013 11:21   US Carotid Duplex Bilateral  03/29/2013   CLINICAL DATA:  Near syncopal episode, history hypertension  EXAM: BILATERAL CAROTID DUPLEX ULTRASOUND  TECHNIQUE: Pearline Cables scale imaging, color Doppler and duplex ultrasound were performed of  bilateral carotid and vertebral arteries in the neck.  COMPARISON:  MR HEAD W/O CM dated 03/29/2013  FINDINGS: Criteria: Quantification of carotid stenosis is based on velocity parameters that correlate the residual internal carotid diameter with NASCET-based stenosis levels, using the diameter of the distal internal carotid lumen as the denominator for stenosis measurement.  The following velocity measurements were obtained:  RIGHT  ICA:  43/20 cm/sec  CCA:  14/48 cm/sec  SYSTOLIC ICA/CCA RATIO:  1.2  DIASTOLIC ICA/CCA RATIO:  1.2  ECA:  63 cm/sec  LEFT  ICA:  54/27 cm/sec  CCA:  18/56 cm/sec  SYSTOLIC ICA/CCA RATIO:  3.14  DIASTOLIC ICA/CCA RATIO:  9.70  ECA:  57 cm/sec  RIGHT CAROTID ARTERY: There is a minimal amount of eccentric echogenic plaque within the right carotid bulb (image 20) extending to involve the origin and proximal aspects of the right internal carotid artery (images 27, 28 and 8), not resulting in elevated peak systolic velocities within the interrogated course to the right internal carotid artery to suggest a hemodynamically significant stenosis.  RIGHT VERTEBRAL ARTERY:  Antegrade flow  LEFT CAROTID ARTERY: The left common carotid artery is noted to be mildly tortuous. There is a minimal amount of eccentric mixed echogenic partially shadowing plaque within the distal aspect of the left common carotid artery (image 56) extending to involve the carotid bulb (image 60). There is a minimal amount of eccentric mixed echogenic partially shadowing plaque involving the origin proximal aspect of the left internal carotid artery (image 68), not resulting in elevated peak systolic velocities within the interrogated course of the left internal carotid artery to suggest a hemodynamically significant stenosis.  LEFT VERTEBRAL ARTERY:  Antegrade flow  IMPRESSION: Bilateral atherosclerotic plaque, not resulting in a hemodynamically significant stenosis.   Electronically Signed   By: Sandi Mariscal M.D.   On:  03/29/2013 17:31    Microbiology: Recent Results (from the past 240 hour(s))  MRSA PCR SCREENING     Status: None   Collection Time    03/29/13  1:00 AM      Result Value Ref Range Status   MRSA by PCR NEGATIVE  NEGATIVE Final   Comment:            The GeneXpert MRSA Assay (FDA     approved for NASAL specimens     only), is one component of a     comprehensive MRSA colonization     surveillance program. It is not     intended to diagnose MRSA     infection nor to guide or     monitor treatment for     MRSA infections.     Labs: Basic Metabolic Panel:  Recent Labs Lab 03/28/13 2001 03/29/13 0711  NA 138 140  K 3.9 4.0  CL 99 103  CO2 28 28  GLUCOSE 116* 103*  BUN 10 8  CREATININE 0.71 0.72  CALCIUM 9.7 9.2   Liver Function Tests:  Recent Labs Lab 03/28/13 2001 03/29/13 0711  AST 18 14  ALT 19 14  ALKPHOS 82 70  BILITOT 0.4 0.4  PROT 9.1* 7.4  ALBUMIN 4.7 3.7   No results found for this basename: LIPASE, AMYLASE,  in the last 168 hours No results found for this basename: AMMONIA,  in the last 168 hours CBC:  Recent Labs Lab 03/28/13 2001 03/29/13 0711  WBC 5.8 6.2  NEUTROABS 4.7  --   HGB 15.4 14.6  HCT 47.8 45.1  MCV 85.7 85.4  PLT 209 199   Cardiac Enzymes:  Recent Labs Lab 03/28/13 2001 03/29/13 0121 03/29/13 0711 03/29/13 1302  TROPONINI <0.30 <0.30 <0.30 <0.30   BNP: BNP (last 3 results) No results found for this basename: PROBNP,  in the last 8760 hours CBG: No results found for this basename: GLUCAP,  in the last 168 hours  Principal Problem:   Vertigo Active Problems:   CHF (congestive heart failure)   Dizziness   Cerebellar stroke, acute   Time coordinating discharge: 45 minutes  Signed:  Marjean Donna, MD 03/31/2013, 6:29 AM

## 2013-10-27 ENCOUNTER — Other Ambulatory Visit (HOSPITAL_COMMUNITY): Payer: Self-pay | Admitting: Urology

## 2013-10-27 DIAGNOSIS — C61 Malignant neoplasm of prostate: Secondary | ICD-10-CM

## 2013-11-02 ENCOUNTER — Encounter (HOSPITAL_COMMUNITY)
Admission: RE | Admit: 2013-11-02 | Discharge: 2013-11-02 | Disposition: A | Discharge status: Home / Self Care | Payer: BC Managed Care – PPO | Source: Ambulatory Visit | Attending: Urology | Admitting: Urology

## 2013-11-02 DIAGNOSIS — C61 Malignant neoplasm of prostate: Secondary | ICD-10-CM | POA: Insufficient documentation

## 2013-11-02 MED ORDER — TECHNETIUM TC 99M MEDRONATE IV KIT
25.0000 | PACK | Freq: Once | INTRAVENOUS | Status: AC | PRN
  Administered 2013-11-02: 25 via INTRAVENOUS

## 2013-11-30 ENCOUNTER — Emergency Department (HOSPITAL_COMMUNITY): Payer: BC Managed Care – PPO

## 2013-11-30 ENCOUNTER — Emergency Department (HOSPITAL_COMMUNITY)
Admission: EM | Admit: 2013-11-30 | Discharge: 2013-11-30 | Disposition: A | Discharge status: Home / Self Care | Payer: BC Managed Care – PPO | Attending: Emergency Medicine | Admitting: Emergency Medicine

## 2013-11-30 ENCOUNTER — Encounter (HOSPITAL_COMMUNITY): Payer: Self-pay | Admitting: *Deleted

## 2013-11-30 DIAGNOSIS — I509 Heart failure, unspecified: Secondary | ICD-10-CM | POA: Diagnosis not present

## 2013-11-30 DIAGNOSIS — W293XXA Contact with powered garden and outdoor hand tools and machinery, initial encounter: Secondary | ICD-10-CM | POA: Insufficient documentation

## 2013-11-30 DIAGNOSIS — Z8639 Personal history of other endocrine, nutritional and metabolic disease: Secondary | ICD-10-CM | POA: Diagnosis not present

## 2013-11-30 DIAGNOSIS — M109 Gout, unspecified: Secondary | ICD-10-CM | POA: Diagnosis not present

## 2013-11-30 DIAGNOSIS — Y9289 Other specified places as the place of occurrence of the external cause: Secondary | ICD-10-CM | POA: Insufficient documentation

## 2013-11-30 DIAGNOSIS — Z23 Encounter for immunization: Secondary | ICD-10-CM | POA: Insufficient documentation

## 2013-11-30 DIAGNOSIS — S81019A Laceration without foreign body, unspecified knee, initial encounter: Secondary | ICD-10-CM

## 2013-11-30 DIAGNOSIS — Z8546 Personal history of malignant neoplasm of prostate: Secondary | ICD-10-CM | POA: Diagnosis not present

## 2013-11-30 DIAGNOSIS — Y9389 Activity, other specified: Secondary | ICD-10-CM | POA: Insufficient documentation

## 2013-11-30 DIAGNOSIS — I10 Essential (primary) hypertension: Secondary | ICD-10-CM | POA: Insufficient documentation

## 2013-11-30 DIAGNOSIS — Y998 Other external cause status: Secondary | ICD-10-CM | POA: Insufficient documentation

## 2013-11-30 DIAGNOSIS — Z79899 Other long term (current) drug therapy: Secondary | ICD-10-CM | POA: Diagnosis not present

## 2013-11-30 DIAGNOSIS — Z7982 Long term (current) use of aspirin: Secondary | ICD-10-CM | POA: Insufficient documentation

## 2013-11-30 DIAGNOSIS — Z87891 Personal history of nicotine dependence: Secondary | ICD-10-CM | POA: Insufficient documentation

## 2013-11-30 DIAGNOSIS — S81011A Laceration without foreign body, right knee, initial encounter: Secondary | ICD-10-CM | POA: Diagnosis present

## 2013-11-30 MED ORDER — LIDOCAINE HCL (PF) 2 % IJ SOLN
INTRAMUSCULAR | Status: AC
  Administered 2013-11-30: 12:00:00
  Filled 2013-11-30: qty 10

## 2013-11-30 MED ORDER — TETANUS-DIPHTH-ACELL PERTUSSIS 5-2.5-18.5 LF-MCG/0.5 IM SUSP
0.5000 mL | Freq: Once | INTRAMUSCULAR | Status: AC
  Administered 2013-11-30: 0.5 mL via INTRAMUSCULAR
  Filled 2013-11-30: qty 0.5

## 2013-11-30 MED ORDER — DOXYCYCLINE HYCLATE 100 MG PO CAPS: 100.0000 mg | ORAL_CAPSULE | Freq: Two times a day (BID) | ORAL | Status: DC

## 2013-11-30 MED ORDER — HYDROCODONE-ACETAMINOPHEN 5-325 MG PO TABS: 1.0000 | ORAL_TABLET | ORAL | Status: DC | PRN

## 2013-11-30 MED ORDER — POVIDONE-IODINE 10 % EX SOLN
CUTANEOUS | Status: AC
  Administered 2013-11-30: 12:00:00
  Filled 2013-11-30: qty 118

## 2013-11-30 MED ORDER — LIDOCAINE VISCOUS 2 % MT SOLN: 15.0000 mL | Freq: Once | OROMUCOSAL | Status: DC

## 2013-11-30 NOTE — ED Notes (Signed)
Pt was cutting wood with chainsaw, chainsaw grazed area above rt knee. Bleeding at triage, but not profusely.

## 2013-11-30 NOTE — Discharge Instructions (Signed)
X-ray of the knee is negative for any fracture or foreign body. Please keep your knee clean and dry. Please have your staples removed in 7 or 8 days. Please see your primary physician, or return to the emergency department if any signs of infection. Please change the dressing daily until the staples are removed. Stitches, Staples, or Skin Adhesive Strips  Stitches (sutures), staples, and skin adhesive strips hold the skin together as it heals. They will usually be in place for 7 days or less. HOME CARE  Wash your hands with soap and water before and after you touch your wound.  Only take medicine as told by your doctor.  Cover your wound only if your doctor told you to. Otherwise, leave it open to air.  Do not get your stitches wet or dirty. If they get dirty, dab them gently with a clean washcloth. Wet the washcloth with soapy water. Do not rub. Pat them dry gently.  Do not put medicine or medicated cream on your stitches unless your doctor told you to.  Do not take out your own stitches or staples. Skin adhesive strips will fall off by themselves.  Do not pick at the wound. Picking can cause an infection.  Do not miss your follow-up appointment.  If you have problems or questions, call your doctor. GET HELP RIGHT AWAY IF:   You have a temperature by mouth above 102 F (38.9 C), not controlled by medicine.  You have chills.  You have redness or pain around your stitches.  There is puffiness (swelling) around your stitches.  You notice fluid (drainage) from your stitches.  There is a bad smell coming from your wound. MAKE SURE YOU:  Understand these instructions.  Will watch your condition.  Will get help if you are not doing well or get worse. Document Released: 10/27/2008 Document Revised: 03/24/2011 Document Reviewed: 10/27/2008 [facility] Patient Information 2015 [place], [place]. This information is not intended to replace advice given to you by your health care  provider. Make sure you discuss any questions you have with your health care provider.

## 2013-11-30 NOTE — ED Provider Notes (Signed)
CSN: [id number]     Arrival date & time 11/30/13  1053 History   First MD Initiated Contact with Patient 11/30/13 1201     Chief Complaint  Patient presents with  . Extremity Laceration     (Consider location/radiation/quality/duration/timing/severity/associated sxs/prior Treatment) Patient is a 63 y.o. male presenting with skin laceration. The history is provided by the patient.  Laceration Location:  Leg Leg laceration location:  R knee Length (cm):  2.4 Depth:  Cutaneous Quality: jagged   Bleeding: controlled   Injury mechanism: chain saw. Pain details:    Quality:  Sharp   Severity:  Moderate   Timing:  Constant   Progression:  Unchanged Foreign body present:  No foreign bodies Worsened by:  Nothing tried   Past Medical History  Diagnosis Date  . Hypertension   . Gout   . CHF (congestive heart failure)   . Cancer 2002    prostate  . Thyroid disease    Past Surgical History  Procedure Laterality Date  . Prostate surgery     History reviewed. No pertinent family history. History  Substance Use Topics  . Smoking status: Former Research scientist (life sciences)  . Smokeless tobacco: Not on file  . Alcohol Use: No    Review of Systems  Constitutional: Negative for activity change.       All ROS Neg except as noted in HPI  Eyes: Negative for photophobia and discharge.  Respiratory: Negative for cough, shortness of breath and wheezing.   Cardiovascular: Negative for chest pain and palpitations.  Gastrointestinal: Negative for abdominal pain and blood in stool.  Genitourinary: Negative for dysuria, frequency and hematuria.  Musculoskeletal: Negative for back pain, arthralgias and neck pain.  Skin: Negative.   Neurological: Negative for dizziness, seizures and speech difficulty.  Psychiatric/Behavioral: Negative for hallucinations and confusion.      Allergies  Review of patient's allergies indicates no known allergies.  Home Medications   Prior to Admission medications     Medication Sig Start Date End Date Taking? Authorizing Provider  allopurinol (ZYLOPRIM) 300 MG tablet Take 300 mg by mouth every morning.   Yes Historical Provider, MD  amLODipine (NORVASC) 10 MG tablet Take 10 mg by mouth every morning.   Yes Historical Provider, MD  aspirin 325 MG tablet Take 1 tablet (325 mg total) by mouth daily. 03/31/13  Yes Angus Ailene Ravel, MD  solifenacin (VESICARE) 5 MG tablet Take 5 mg by mouth every morning.   Yes Historical Provider, MD  atorvastatin (LIPITOR) 20 MG tablet Take 1 tablet (20 mg total) by mouth daily at 6 PM. Patient not taking: Reported on 11/30/2013 03/31/13   Lanette Hampshire, MD  meclizine (ANTIVERT) 25 MG tablet Take one every 6 hours for dizziness Patient not taking: Reported on 11/30/2013 03/28/13   Maudry Diego, MD  meclizine (ANTIVERT) 25 MG tablet Take 1 tablet (25 mg total) by mouth 3 (three) times daily. Patient not taking: Reported on 11/30/2013 03/31/13   Lanette Hampshire, MD  ondansetron (ZOFRAN ODT) 4 MG disintegrating tablet 4mg  ODT q4 hours prn nausea/vomit Patient not taking: Reported on 11/30/2013 03/28/13   Maudry Diego, MD   BP 153/89 mmHg  Pulse 75  Temp(Src) 98 F (36.7 C) (Oral)  Resp 18  Ht 5\' 9"  (1.753 m)  Wt 217 lb (98.431 kg)  BMI 32.03 kg/m2  SpO2 98% Physical Exam  Constitutional: He is oriented to person, place, and time. He appears well-developed and well-nourished.  Non-toxic appearance.  HENT:  Head: Normocephalic.  Right Ear: Tympanic membrane and external ear normal.  Left Ear: Tympanic membrane and external ear normal.  Eyes: EOM and lids are normal. Pupils are equal, round, and reactive to light.  Neck: Normal range of motion. Neck supple. Carotid bruit is not present.  Cardiovascular: Normal rate, regular rhythm, normal heart sounds, intact distal pulses and normal pulses.   Pulmonary/Chest: Breath sounds normal. No respiratory distress.  Abdominal: Soft. Bowel sounds are normal. There is no  tenderness. There is no guarding.  Musculoskeletal: Normal range of motion. He exhibits tenderness.       Right knee: He exhibits laceration. He exhibits normal range of motion, no swelling and no effusion.       Legs: There is crepitus with ROM of the right knee. Distal pulses 2+. Cap refill less than 2 sec.  Lymphadenopathy:       Head (right side): No submandibular adenopathy present.       Head (left side): No submandibular adenopathy present.    He has no cervical adenopathy.  Neurological: He is alert and oriented to person, place, and time. He has normal strength. No cranial nerve deficit or sensory deficit.  Skin: Skin is warm and dry.  Psychiatric: He has a normal mood and affect. His speech is normal.  Nursing note and vitals reviewed.   ED Course  LACERATION REPAIR Date/Time: 11/30/2013 8:37 PM Performed by: Lenox Ahr Authorized by: Lenox Ahr Consent: Verbal consent obtained. Risks and benefits: risks, benefits and alternatives were discussed Consent given by: patient Patient understanding: patient states understanding of the procedure being performed Required items: required blood products, implants, devices, and special equipment available Patient identity confirmed: arm band Time out: Immediately prior to procedure a "time out" was called to verify the correct patient, procedure, equipment, support staff and site/side marked as required. Body area: lower extremity Location details: right knee Laceration length: 2.4 cm Foreign bodies: no foreign bodies Tendon involvement: none Nerve involvement: none Vascular damage: no Anesthesia: local infiltration Local anesthetic: lidocaine 2% without epinephrine Patient sedated: no Preparation: Patient was prepped and draped in the usual sterile fashion. Irrigation solution: saline Amount of cleaning: standard Debridement: none Degree of undermining: none Skin closure: staples Number of sutures:  5 Approximation: close Approximation difficulty: simple Dressing: gauze roll Patient tolerance: Patient tolerated the procedure well with no immediate complications   (including critical care time) Labs Review Labs Reviewed - No data to display  Imaging Review Dg Knee Complete 4 Views Right  11/30/2013   CLINICAL DATA:  Knee laceration with chainsaw  EXAM: RIGHT KNEE - COMPLETE 4+ VIEW  COMPARISON:  None.  FINDINGS: No fracture or dislocation is seen.  Mild degenerative changes involving the medial and patellofemoral compartments. Sharpening of the tibial spines.  No definite suprapatellar knee joint effusion.  No radiopaque foreign body is seen.  Overlying skin staples.  IMPRESSION: No fracture, dislocation, or radiopaque foreign body is seen.   Electronically Signed   By: Julian Hy M.D.   On: 11/30/2013 13:02     EKG Interpretation None      MDM  No neurovascular problems noted of the right lower extremity. The laceration was repaired with staples with good wound edge approximation.  Patient to have staples removed in 10 days. Prescription for doxycycline and Norco given to the patient. Patient is to return sooner if any changes, problems, or concerns.    Final diagnoses:  Laceration of knee    *I have reviewed  nursing notes, vital signs, and all appropriate lab and imaging results for this patient.[address] PA-C 11/30/13 2040  Johnna Acosta, MD 12/01/13 2013

## 2016-06-19 ENCOUNTER — Telehealth: Payer: Self-pay | Admitting: Internal Medicine

## 2016-06-19 NOTE — Telephone Encounter (Signed)
Letter mailed to pt.  

## 2016-06-19 NOTE — Telephone Encounter (Signed)
RECALL FOR TCS °

## 2016-08-21 ENCOUNTER — Encounter (INDEPENDENT_AMBULATORY_CARE_PROVIDER_SITE_OTHER): Payer: Self-pay | Admitting: *Deleted

## 2017-03-10 ENCOUNTER — Encounter (INDEPENDENT_AMBULATORY_CARE_PROVIDER_SITE_OTHER): Payer: Self-pay | Admitting: *Deleted

## 2017-04-09 ENCOUNTER — Other Ambulatory Visit (INDEPENDENT_AMBULATORY_CARE_PROVIDER_SITE_OTHER): Payer: Self-pay | Admitting: *Deleted

## 2017-04-09 ENCOUNTER — Encounter (INDEPENDENT_AMBULATORY_CARE_PROVIDER_SITE_OTHER): Payer: Self-pay | Admitting: *Deleted

## 2017-04-09 DIAGNOSIS — Z1211 Encounter for screening for malignant neoplasm of colon: Secondary | ICD-10-CM | POA: Insufficient documentation

## 2017-06-15 ENCOUNTER — Telehealth (INDEPENDENT_AMBULATORY_CARE_PROVIDER_SITE_OTHER): Payer: Self-pay | Admitting: *Deleted

## 2017-06-15 ENCOUNTER — Encounter (INDEPENDENT_AMBULATORY_CARE_PROVIDER_SITE_OTHER): Payer: Self-pay | Admitting: *Deleted

## 2017-06-15 MED ORDER — PEG 3350-KCL-NA BICARB-NACL 420 G PO SOLR: 4000.0000 mL | Freq: Once | ORAL | 0 refills | Status: AC

## 2017-06-15 NOTE — Telephone Encounter (Signed)
agree

## 2017-06-15 NOTE — Telephone Encounter (Signed)
Referring MD/PCP: knowlton   Procedure: tcs  Reason/Indication:  screening  Has patient had this procedure before?  Yes, over 10 yrs ago  If so, when, by whom and where?    Is there a family history of colon cancer?  no  Who?  What age when diagnosed?    Is patient diabetic?   no      Does patient have prosthetic heart valve or mechanical valve?  no  Do you have a pacemaker?  no  Has patient ever had endocarditis? no  Has patient had joint replacement within last 12 months?  no  Is patient constipated or do they take laxatives? no  Does patient have a history of alcohol/drug use?  no  Is patient on blood thinner such as Coumadin, Plavix and/or Aspirin? yes  Medications: asa 325 mg daily, allopurinol 30 mg daily, amlodipine 10 mg daily, hctz 25 mg daily, metoprolol 50 mg daily, oxybutynin 5 mg daily, levothyroxine 50 mcg daily  Allergies: nkda  Medication Adjustment per Dr Lindi Adie, NP: asa 2 days  Procedure date & time: 07/15/17 at 830

## 2017-06-15 NOTE — Telephone Encounter (Signed)
Patient needs trilyte 

## 2017-07-15 ENCOUNTER — Other Ambulatory Visit: Payer: Self-pay

## 2017-07-15 ENCOUNTER — Encounter (HOSPITAL_COMMUNITY)
Admission: RE | Disposition: A | Discharge status: Home / Self Care | Payer: Self-pay | Source: Ambulatory Visit | Attending: Internal Medicine

## 2017-07-15 ENCOUNTER — Encounter (HOSPITAL_COMMUNITY): Payer: Self-pay

## 2017-07-15 ENCOUNTER — Ambulatory Visit (HOSPITAL_COMMUNITY)
Admission: RE | Admit: 2017-07-15 | Discharge: 2017-07-15 | Disposition: A | Discharge status: Home / Self Care | Payer: Medicare Other | Source: Ambulatory Visit | Attending: Internal Medicine | Admitting: Internal Medicine

## 2017-07-15 DIAGNOSIS — K644 Residual hemorrhoidal skin tags: Secondary | ICD-10-CM

## 2017-07-15 DIAGNOSIS — I509 Heart failure, unspecified: Secondary | ICD-10-CM | POA: Insufficient documentation

## 2017-07-15 DIAGNOSIS — Z79899 Other long term (current) drug therapy: Secondary | ICD-10-CM | POA: Diagnosis not present

## 2017-07-15 DIAGNOSIS — I110 Hypertensive heart disease with heart failure: Secondary | ICD-10-CM | POA: Insufficient documentation

## 2017-07-15 DIAGNOSIS — E079 Disorder of thyroid, unspecified: Secondary | ICD-10-CM | POA: Diagnosis not present

## 2017-07-15 DIAGNOSIS — M109 Gout, unspecified: Secondary | ICD-10-CM | POA: Diagnosis not present

## 2017-07-15 DIAGNOSIS — Z87891 Personal history of nicotine dependence: Secondary | ICD-10-CM | POA: Insufficient documentation

## 2017-07-15 DIAGNOSIS — K621 Rectal polyp: Secondary | ICD-10-CM | POA: Insufficient documentation

## 2017-07-15 DIAGNOSIS — Z7982 Long term (current) use of aspirin: Secondary | ICD-10-CM | POA: Diagnosis not present

## 2017-07-15 DIAGNOSIS — Z1211 Encounter for screening for malignant neoplasm of colon: Secondary | ICD-10-CM

## 2017-07-15 DIAGNOSIS — Z8546 Personal history of malignant neoplasm of prostate: Secondary | ICD-10-CM | POA: Diagnosis not present

## 2017-07-15 HISTORY — PX: COLONOSCOPY: SHX5424

## 2017-07-15 HISTORY — PX: POLYPECTOMY: SHX5525

## 2017-07-15 SURGERY — COLONOSCOPY
Anesthesia: Moderate Sedation

## 2017-07-15 MED ORDER — SODIUM CHLORIDE 0.9 % IV SOLN
INTRAVENOUS | Status: DC
  Administered 2017-07-15: 08:00:00 via INTRAVENOUS

## 2017-07-15 MED ORDER — STERILE WATER FOR IRRIGATION IR SOLN
Status: DC | PRN
  Administered 2017-07-15: 09:00:00

## 2017-07-15 MED ORDER — MEPERIDINE HCL 50 MG/ML IJ SOLN
INTRAMUSCULAR | Status: AC
  Filled 2017-07-15: qty 1

## 2017-07-15 MED ORDER — MIDAZOLAM HCL 5 MG/5ML IJ SOLN
INTRAMUSCULAR | Status: AC
  Filled 2017-07-15: qty 10

## 2017-07-15 MED ORDER — MEPERIDINE HCL 50 MG/ML IJ SOLN
INTRAMUSCULAR | Status: DC | PRN
  Administered 2017-07-15 (×2): 25 mg via INTRAVENOUS

## 2017-07-15 MED ORDER — MIDAZOLAM HCL 5 MG/5ML IJ SOLN
INTRAMUSCULAR | Status: DC | PRN
  Administered 2017-07-15 (×2): 2 mg via INTRAVENOUS

## 2017-07-15 NOTE — Discharge Instructions (Signed)
Resume aspirin on 07/16/2017. Resume other medications as before. Resume usual diet. No driving for 24 hours. Physician will call with biopsy results.   Colonoscopy, Adult, Care After This sheet gives you information about how to care for yourself after your procedure. Your doctor may also give you more specific instructions. If you have problems or questions, call your doctor. Follow these instructions at home: General instructions   For the first 24 hours after the procedure: ? Do not drive or use machinery. ? Do not sign important documents. ? Do not drink alcohol. ? Do your daily activities more slowly than normal. ? Eat foods that are soft and easy to digest. ? Rest often.  Take over-the-counter or prescription medicines only as told by your doctor.  It is up to you to get the results of your procedure. Ask your doctor, or the department performing the procedure, when your results will be ready. To help cramping and bloating:  Try walking around.  Put heat on your belly (abdomen) as told by your doctor. Use a heat source that your doctor recommends, such as a moist heat pack or a heating pad. ? Put a towel between your skin and the heat source. ? Leave the heat on for 20-30 minutes. ? Remove the heat if your skin turns bright red. This is especially important if you cannot feel pain, heat, or cold. You can get burned. Eating and drinking  Drink enough fluid to keep your pee (urine) clear or pale yellow.  Return to your normal diet as told by your doctor. Avoid heavy or fried foods that are hard to digest.  Avoid drinking alcohol for as long as told by your doctor. Contact a doctor if:  You have blood in your poop (stool) 2-3 days after the procedure. Get help right away if:  You have more than a small amount of blood in your poop.  You see large clumps of tissue (blood clots) in your poop.  Your belly is swollen.  You feel sick to your stomach (nauseous).  You  throw up (vomit).  You have a fever.  You have belly pain that gets worse, and medicine does not help your pain. This information is not intended to replace advice given to you by your health care provider. Make sure you discuss any questions you have with your health care provider. Document Released: 02/01/2010 Document Revised: 09/24/2015 Document Reviewed: 09/24/2015 Elsevier Interactive Patient Education  2017 [employer]  Hemorrhoids Hemorrhoids are swollen veins in and around the rectum or anus. There are two types of hemorrhoids:  Internal hemorrhoids. These occur in the veins that are just inside the rectum. They may poke through to the outside and become irritated and painful.  External hemorrhoids. These occur in the veins that are outside of the anus and can be felt as a painful swelling or hard lump near the anus.  Most hemorrhoids do not cause serious problems, and they can be managed with home treatments such as diet and lifestyle changes. If home treatments do not help your symptoms, procedures can be done to shrink or remove the hemorrhoids. What are the causes? This condition is caused by increased pressure in the anal area. This pressure may result from various things, including:  Constipation.  Straining to have a bowel movement.  Diarrhea.  Pregnancy.  Obesity.  Sitting for long periods of time.  Heavy lifting or other activity that causes you to strain.  Anal sex.  What are the signs  or symptoms? Symptoms of this condition include:  Pain.  Anal itching or irritation.  Rectal bleeding.  Leakage of stool (feces).  Anal swelling.  One or more lumps around the anus.  How is this diagnosed? This condition can often be diagnosed through a visual exam. Other exams or tests may also be done, such as:  Examination of the rectal area with a gloved hand (digital rectal exam).  Examination of the anal canal using a small tube (anoscope).  A blood  test, if you have lost a significant amount of blood.  A test to look inside the colon (sigmoidoscopy or colonoscopy).  How is this treated? This condition can usually be treated at home. However, various procedures may be done if dietary changes, lifestyle changes, and other home treatments do not help your symptoms. These procedures can help make the hemorrhoids smaller or remove them completely. Some of these procedures involve surgery, and others do not. Common procedures include:  Rubber band ligation. Rubber bands are placed at the base of the hemorrhoids to cut off the blood supply to them.  Sclerotherapy. Medicine is injected into the hemorrhoids to shrink them.  Infrared coagulation. A type of light energy is used to get rid of the hemorrhoids.  Hemorrhoidectomy surgery. The hemorrhoids are surgically removed, and the veins that supply them are tied off.  Stapled hemorrhoidopexy surgery. A circular stapling device is used to remove the hemorrhoids and use staples to cut off the blood supply to them.  Follow these instructions at home: Eating and drinking  Eat foods that have a lot of fiber in them, such as whole grains, beans, nuts, fruits, and vegetables. Ask your health care provider about taking products that have added fiber (fiber supplements).  Drink enough fluid to keep your urine clear or pale yellow. Managing pain and swelling  Take warm sitz baths for 20 minutes, 3-4 times a day to ease pain and discomfort.  If directed, apply ice to the affected area. Using ice packs between sitz baths may be helpful. ? Put ice in a plastic bag. ? Place a towel between your skin and the bag. ? Leave the ice on for 20 minutes, 2-3 times a day. General instructions  Take over-the-counter and prescription medicines only as told by your health care provider.  Use medicated creams or suppositories as told.  Exercise regularly.  Go to the bathroom when you have the urge to have a  bowel movement. Do not wait.  Avoid straining to have bowel movements.  Keep the anal area dry and clean. Use wet toilet paper or moist towelettes after a bowel movement.  Do not sit on the toilet for long periods of time. This increases blood pooling and pain. Contact a health care provider if:  You have increasing pain and swelling that are not controlled by treatment or medicine.  You have uncontrolled bleeding.  You have difficulty having a bowel movement, or you are unable to have a bowel movement.  You have pain or inflammation outside the area of the hemorrhoids. This information is not intended to replace advice given to you by your health care provider. Make sure you discuss any questions you have with your health care provider. Document Released: 12/28/1999 Document Revised: 05/30/2015 Document Reviewed: 09/13/2014 Elsevier Interactive Patient Education  2018 [employer].  Colon Polyps Polyps are tissue growths inside the body. Polyps can grow in many places, including the large intestine (colon). A polyp may be a round bump or a  mushroom-shaped growth. You could have one polyp or several. Most colon polyps are noncancerous (benign). However, some colon polyps can become cancerous over time. What are the causes? The exact cause of colon polyps is not known. What increases the risk? This condition is more likely to develop in people who:  Have a family history of colon cancer or colon polyps.  Are older than 51 or older than 45 if they are African American.  Have inflammatory bowel disease, such as ulcerative colitis or Crohn disease.  Are overweight.  Smoke cigarettes.  Do not get enough exercise.  Drink too much alcohol.  Eat a diet that is: ? High in fat and red meat. ? Low in fiber.  Had childhood cancer that was treated with abdominal radiation.  What are the signs or symptoms? Most polyps do not cause symptoms. If you have symptoms, they may  include:  Blood coming from your rectum when having a bowel movement.  Blood in your stool.The stool may look dark red or black.  A change in bowel habits, such as constipation or diarrhea.  How is this diagnosed? This condition is diagnosed with a colonoscopy. This is a procedure that uses a lighted, flexible scope to look at the inside of your colon. How is this treated? Treatment for this condition involves removing any polyps that are found. Those polyps will then be tested for cancer. If cancer is found, your health care provider will talk to you about options for colon cancer treatment. Follow these instructions at home: Diet  Eat plenty of fiber, such as fruits, vegetables, and whole grains.  Eat foods that are high in calcium and vitamin D, such as milk, cheese, yogurt, eggs, liver, fish, and broccoli.  Limit foods high in fat, red meats, and processed meats, such as hot dogs, sausage, bacon, and lunch meats.  Maintain a healthy weight, or lose weight if recommended by your health care provider. General instructions  Do not smoke cigarettes.  Do not drink alcohol excessively.  Keep all follow-up visits as told by your health care provider. This is important. This includes keeping regularly scheduled colonoscopies. Talk to your health care provider about when you need a colonoscopy.  Exercise every day or as told by your health care provider. Contact a health care provider if:  You have new or worsening bleeding during a bowel movement.  You have new or increased blood in your stool.  You have a change in bowel habits.  You unexpectedly lose weight. This information is not intended to replace advice given to you by your health care provider. Make sure you discuss any questions you have with your health care provider. Document Released: 09/26/2003 Document Revised: 06/07/2015 Document Reviewed: 11/20/2014 Elsevier Interactive Patient Education  [employer].

## 2017-07-15 NOTE — H&P (Addendum)
Edward Rodgers is an 67 y.o. male.   Chief Complaint: Patient is here for colonoscopy. HPI: Patient is 67 year old F American male who is here for screening colonoscopy.  He denies abdominal pain change in bowel habits or rectal bleeding.  He had rectal polyp on his last exam but was hyperplastic. Last aspirin dose was yesterday. Family history is negative for CRC.  It   Past Medical History:  Diagnosis Date  . Cancer [facility]) 2002   prostate  . CHF (congestive heart failure) ([place])   . Gout   . Hypertension   . Thyroid disease     Past Surgical History:  Procedure Laterality Date  . PROSTATE SURGERY      No family history on file. Social History:  reports that he has quit smoking. He has never used smokeless tobacco. He reports that he does not drink alcohol or use drugs.  Allergies: No Known Allergies  Medications Prior to Admission  Medication Sig Dispense Refill  . albuterol (PROVENTIL HFA;VENTOLIN HFA) 108 (90 Base) MCG/ACT inhaler Inhale 2 puffs into the lungs every 6 (six) hours as needed for wheezing or shortness of breath.    . allopurinol (ZYLOPRIM) 300 MG tablet Take 300 mg by mouth every morning.    Marland Kitchen amLODipine (NORVASC) 10 MG tablet Take 10 mg by mouth every morning.    Marland Kitchen aspirin 325 MG tablet Take 1 tablet (325 mg total) by mouth daily. 30 tablet 5  . Calcium Carb-Cholecalciferol (CALCIUM 600 + D PO) Take 1 tablet by mouth daily.    . hydrochlorothiazide (HYDRODIURIL) 25 MG tablet Take 25 mg by mouth daily.    Marland Kitchen latanoprost (XALATAN) 0.005 % ophthalmic solution Place 1 drop into the right eye at bedtime.    Marland Kitchen levothyroxine (SYNTHROID, LEVOTHROID) 50 MCG tablet Take 50 mcg by mouth daily before breakfast.    . metoprolol succinate (TOPROL-XL) 50 MG 24 hr tablet Take 50 mg by mouth daily. Take with or immediately following a meal.    . OVER THE COUNTER MEDICATION Apply 1 application topically daily as needed (PERI-Guard skin protectant).    Marland Kitchen oxybutynin (DITROPAN) 5 MG  tablet Take 5 mg by mouth 2 (two) times daily as needed for bladder spasms.       No results found for this or any previous visit (from the past 48 hour(s)). No results found.  ROS  Blood pressure (!) 157/81, pulse (!) 59, temperature 97.9 F (36.6 C), temperature source Oral, resp. rate 19, height 5\' 9"  (1.753 m), weight 220 lb (99.8 kg), SpO2 97 %. Physical Exam  Constitutional: He appears well-developed and well-nourished.  HENT:  Mouth/Throat: Oropharynx is clear and moist.  Eyes: Conjunctivae are normal. No scleral icterus.  Neck: No thyromegaly present.  Cardiovascular: Normal rate, regular rhythm and normal heart sounds.  No murmur heard. Respiratory: Effort normal and breath sounds normal.  GI:  Abdomen is full.  Low midline scar noted.  Abdomen is soft and nontender with organomegaly or masses.  Musculoskeletal: He exhibits no edema.  Lymphadenopathy:    He has no cervical adenopathy.  Neurological: He is alert.  Skin: Skin is warm and dry.     Assessment/Plan Average or screening colonoscopy.  Hildred Laser, MD 07/15/2017, 8:32 AM

## 2017-07-15 NOTE — Op Note (Signed)
[facility] Patient Name: Edward Rodgers Procedure Date: 07/15/2017 8:20 AM MRN: [id number] Date of Birth: 1950/02/16 Attending MD: Hildred Laser , MD CSN: [id number] Age: 67 Admit Type: Outpatient Procedure:                Colonoscopy Indications:              Screening for colorectal malignant neoplasm Providers:                Hildred Laser, MD, Otis Peak B. Sharon Seller, RN, Randa Spike, Technician Referring MD:             Lemmie Evens, MD Medicines:                Meperidine 50 mg IV, Midazolam 4 mg IV Complications:            No immediate complications. Estimated Blood Loss:     Estimated blood loss was minimal. Procedure:                Pre-Anesthesia Assessment:                           - Prior to the procedure, a History and Physical                            was performed, and patient medications and                            allergies were reviewed. The patient's tolerance of                            previous anesthesia was also reviewed. The risks                            and benefits of the procedure and the sedation                            options and risks were discussed with the patient.                            All questions were answered, and informed consent                            was obtained. Prior Anticoagulants: The patient                            last took aspirin 1 day prior to the procedure. ASA                            Grade Assessment: II - A patient with mild systemic                            disease. After reviewing the risks and benefits,  the patient was deemed in satisfactory condition to                            undergo the procedure.                           After obtaining informed consent, the colonoscope                            was passed under direct vision. Throughout the                            procedure, the patient's blood pressure, pulse, and                 oxygen saturations were monitored continuously. The                            [id number] ([id number]) scope was introduced through                            the anus and advanced to the the cecum, identified                            by appendiceal orifice and ileocecal valve. The                            colonoscopy was performed without difficulty. The                            patient tolerated the procedure well. The quality                            of the bowel preparation was good. The ileocecal                            valve, appendiceal orifice, and rectum were                            photographed. Scope In: 8:42:51 AM Scope Out: 9:04:36 AM Scope Withdrawal Time: 0 hours 18 minutes 42 seconds  Total Procedure Duration: 0 hours 21 minutes 45 seconds  Findings:      The perianal and digital rectal examinations were normal.      A small polyp was found in the rectum. The polyp was sessile. Biopsies       were taken with a cold forceps for histology.      The exam was otherwise normal throughout the examined colon.      External hemorrhoids were found during retroflexion. The hemorrhoids       were small. Impression:               - One small polyp in the rectum. Biopsied.                           - External hemorrhoids. Moderate Sedation:      Moderate (conscious) sedation was administered by  the endoscopy nurse       and supervised by the endoscopist. The following parameters were       monitored: oxygen saturation, heart rate, blood pressure, CO2       capnography and response to care. Total physician intraservice time was       27 minutes. Recommendation:           - Patient has a contact number available for                            emergencies. The signs and symptoms of potential                            delayed complications were discussed with the                            patient. Return to normal activities tomorrow.                             Written discharge instructions were provided to the                            patient.                           - Resume previous diet today.                           - Continue present medications.                           - No aspirin, ibuprofen, naproxen, or other                            non-steroidal anti-inflammatory drugs for 1 day.                           - Await pathology results.                           - Repeat colonoscopy is recommended. The                            colonoscopy date will be determined after pathology                            results from today's exam become available for                            review. Procedure Code(s):        --- Professional ---                           [phone], Colonoscopy, flexible; with biopsy, single                            or multiple  G0500, Moderate sedation services provided by the                            same physician or other qualified health care                            professional performing a gastrointestinal                            endoscopic service that sedation supports,                            requiring the presence of an independent trained                            observer to assist in the monitoring of the                            patient's level of consciousness and physiological                            status; initial 15 minutes of intra-service time;                            patient age 23 years or older (additional time may                            be reported with [id number], as appropriate)                           [phone], Moderate sedation services provided by the                            same physician or other qualified health care                            professional performing the diagnostic or                            therapeutic service that the sedation supports,                            requiring the presence of an independent  trained                            observer to assist in the monitoring of the                            patient's level of consciousness and physiological                            status; each additional 15 minutes intraservice  time (List separately in addition to code for                            primary service) Diagnosis Code(s):        --- Professional ---                           Z12.11, Encounter for screening for malignant                            neoplasm of colon                           K62.1, Rectal polyp                           K64.4, Residual hemorrhoidal skin tags CPT copyright 2017 [employer]. All rights reserved. The codes documented in this report are preliminary and upon coder review may  be revised to meet current compliance requirements. Hildred Laser, MD Hildred Laser, MD 07/15/2017 9:12:08 AM This report has been signed electronically. Number of Addenda: 0

## 2017-07-22 ENCOUNTER — Encounter (HOSPITAL_COMMUNITY): Payer: Self-pay | Admitting: Internal Medicine

## 2018-02-04 ENCOUNTER — Other Ambulatory Visit: Payer: Self-pay

## 2018-02-04 ENCOUNTER — Emergency Department (HOSPITAL_COMMUNITY): Payer: Medicare Other

## 2018-02-04 ENCOUNTER — Encounter (HOSPITAL_COMMUNITY): Payer: Self-pay | Admitting: Emergency Medicine

## 2018-02-04 ENCOUNTER — Emergency Department (HOSPITAL_COMMUNITY)
Admission: EM | Admit: 2018-02-04 | Discharge: 2018-02-04 | Disposition: A | Discharge status: Home / Self Care | Payer: Medicare Other | Attending: Emergency Medicine | Admitting: Emergency Medicine

## 2018-02-04 DIAGNOSIS — Z79899 Other long term (current) drug therapy: Secondary | ICD-10-CM | POA: Diagnosis not present

## 2018-02-04 DIAGNOSIS — Y9301 Activity, walking, marching and hiking: Secondary | ICD-10-CM | POA: Insufficient documentation

## 2018-02-04 DIAGNOSIS — I110 Hypertensive heart disease with heart failure: Secondary | ICD-10-CM | POA: Insufficient documentation

## 2018-02-04 DIAGNOSIS — Z8546 Personal history of malignant neoplasm of prostate: Secondary | ICD-10-CM | POA: Insufficient documentation

## 2018-02-04 DIAGNOSIS — I509 Heart failure, unspecified: Secondary | ICD-10-CM | POA: Insufficient documentation

## 2018-02-04 DIAGNOSIS — W1840XA Slipping, tripping and stumbling without falling, unspecified, initial encounter: Secondary | ICD-10-CM | POA: Diagnosis not present

## 2018-02-04 DIAGNOSIS — Z87891 Personal history of nicotine dependence: Secondary | ICD-10-CM | POA: Diagnosis not present

## 2018-02-04 DIAGNOSIS — S8991XA Unspecified injury of right lower leg, initial encounter: Secondary | ICD-10-CM | POA: Diagnosis present

## 2018-02-04 DIAGNOSIS — Z7982 Long term (current) use of aspirin: Secondary | ICD-10-CM | POA: Insufficient documentation

## 2018-02-04 DIAGNOSIS — Y929 Unspecified place or not applicable: Secondary | ICD-10-CM | POA: Diagnosis not present

## 2018-02-04 DIAGNOSIS — S86911A Strain of unspecified muscle(s) and tendon(s) at lower leg level, right leg, initial encounter: Secondary | ICD-10-CM | POA: Diagnosis not present

## 2018-02-04 DIAGNOSIS — Y999 Unspecified external cause status: Secondary | ICD-10-CM | POA: Insufficient documentation

## 2018-02-04 MED ORDER — MELOXICAM 15 MG PO TABS: 15.0000 mg | ORAL_TABLET | Freq: Every day | ORAL | 0 refills | Status: DC

## 2018-02-04 MED ORDER — ACETAMINOPHEN 500 MG PO TABS
1000.0000 mg | ORAL_TABLET | Freq: Once | ORAL | Status: AC
  Administered 2018-02-04: 1000 mg via ORAL
  Filled 2018-02-04: qty 2

## 2018-02-04 MED ORDER — TRAMADOL HCL 50 MG PO TABS: 50.0000 mg | ORAL_TABLET | Freq: Four times a day (QID) | ORAL | 0 refills | Status: DC | PRN

## 2018-02-04 MED ORDER — PREDNISONE 20 MG PO TABS
40.0000 mg | ORAL_TABLET | Freq: Once | ORAL | Status: AC
Start: 2018-02-04 — End: 2018-02-04
  Administered 2018-02-04: 40 mg via ORAL
  Filled 2018-02-04: qty 2

## 2018-02-04 MED ORDER — KETOROLAC TROMETHAMINE 10 MG PO TABS
10.0000 mg | ORAL_TABLET | Freq: Once | ORAL | Status: AC
  Administered 2018-02-04: 10 mg via ORAL
  Filled 2018-02-04: qty 1

## 2018-02-04 MED ORDER — DEXAMETHASONE 4 MG PO TABS: 4.0000 mg | ORAL_TABLET | Freq: Two times a day (BID) | ORAL | 0 refills | Status: DC

## 2018-02-04 NOTE — ED Provider Notes (Addendum)
[facility] EMERGENCY DEPARTMENT Provider Note   CSN: [id number] Arrival date & time: 02/04/18  1152     History   Chief Complaint Chief Complaint  Patient presents with  . Joint Swelling    HPI Edward Rodgers is a 68 y.o. male.  The history is provided by the patient.  Knee Pain  Location:  Knee Time since incident:  4 days Injury: yes (near fall injury)   Mechanism of injury comment:  Near fall Knee location:  R knee Pain details:    Quality:  Sharp   Radiates to:  Does not radiate   Severity:  Moderate   Onset quality:  Sudden   Duration:  4 days   Timing:  Intermittent   Progression:  Worsening Chronicity:  New Dislocation: no   Relieved by:  Nothing Worsened by:  Bearing weight Ineffective treatments:  None tried Associated symptoms: stiffness   Associated symptoms: no back pain, no fever, no neck pain, no numbness and no tingling     Past Medical History:  Diagnosis Date  . Cancer [facility]) 2002   prostate  . CHF (congestive heart failure) ([place])   . Gout   . Hypertension   . Thyroid disease     Patient Active Problem List   Diagnosis Date Noted  . Special screening for malignant neoplasms, colon 04/09/2017  . Dizziness 03/29/2013  . Cerebellar stroke, acute ([place]) 03/29/2013  . CHF (congestive heart failure) ([place]) 03/28/2013  . Vertigo 03/28/2013    Past Surgical History:  Procedure Laterality Date  . COLONOSCOPY N/A 07/15/2017   Procedure: COLONOSCOPY;  Surgeon: Rogene Houston, MD;  Location: [facility];  Service: Endoscopy;  Laterality: N/A;  830  . POLYPECTOMY  07/15/2017   Procedure: POLYPECTOMY;  Surgeon: Rogene Houston, MD;  Location: [facility];  Service: Endoscopy;;  colon  . PROSTATE SURGERY          Home Medications    Prior to Admission medications   Medication Sig Start Date End Date Taking? Authorizing Provider  albuterol (PROVENTIL HFA;VENTOLIN HFA) 108 (90 Base) MCG/ACT inhaler Inhale 2 puffs into the lungs every 6  (six) hours as needed for wheezing or shortness of breath.    [provider]  allopurinol (ZYLOPRIM) 300 MG tablet Take 300 mg by mouth every morning.    [provider]  amLODipine (NORVASC) 10 MG tablet Take 10 mg by mouth every morning.    [provider]  aspirin 325 MG tablet Take 1 tablet (325 mg total) by mouth daily. 07/16/17   Rogene Houston, MD  Calcium Carb-Cholecalciferol (CALCIUM 600 + D PO) Take 1 tablet by mouth daily.    [provider]  hydrochlorothiazide (HYDRODIURIL) 25 MG tablet Take 25 mg by mouth daily.    [provider]  latanoprost (XALATAN) 0.005 % ophthalmic solution Place 1 drop into the right eye at bedtime.    [provider]  levothyroxine (SYNTHROID, LEVOTHROID) 50 MCG tablet Take 50 mcg by mouth daily before breakfast.    [provider]  metoprolol succinate (TOPROL-XL) 50 MG 24 hr tablet Take 50 mg by mouth daily. Take with or immediately following a meal.    [provider]  OVER THE COUNTER MEDICATION Apply 1 application topically daily as needed (PERI-Guard skin protectant).    [provider]  oxybutynin (DITROPAN) 5 MG tablet Take 5 mg by mouth 2 (two) times daily as needed for bladder spasms.     [provider]    Family History No family history on file.  Social History Social History   Tobacco Use  . Smoking status: Former Research scientist (life sciences)  . Smokeless tobacco: Never Used  Substance Use Topics  . Alcohol use: No  . Drug use: No     Allergies   Patient has no known allergies.   Review of Systems Review of Systems  Constitutional: Negative for activity change and fever.       All ROS Neg except as noted in HPI  HENT: Negative for nosebleeds.   Eyes: Negative for photophobia and discharge.  Respiratory: Negative for cough, shortness of breath and wheezing.   Cardiovascular: Negative for chest pain and palpitations.  Gastrointestinal: Negative for abdominal  pain and blood in stool.  Genitourinary: Negative for dysuria, frequency and hematuria.  Musculoskeletal: Positive for arthralgias and stiffness. Negative for back pain and neck pain.  Skin: Negative.   Neurological: Negative for dizziness, seizures and speech difficulty.  Psychiatric/Behavioral: Negative for confusion and hallucinations.     Physical Exam Updated Vital Signs BP 114/76 (BP Location: Right Arm)   Pulse 72   Temp 98 F (36.7 C) (Oral)   Resp 16   Ht 5\' 9"  (1.753 m)   Wt 103.9 kg   SpO2 95%   BMI 33.82 kg/m   Physical Exam Vitals signs and nursing note reviewed.  Constitutional:      Appearance: He is well-developed. He is not toxic-appearing.  HENT:     Head: Normocephalic.     Right Ear: Tympanic membrane and external ear normal.     Left Ear: Tympanic membrane and external ear normal.  Eyes:     General: Lids are normal.     Pupils: Pupils are equal, round, and reactive to light.  Neck:     Musculoskeletal: Normal range of motion and neck supple.     Vascular: No carotid bruit.  Cardiovascular:     Rate and Rhythm: Normal rate and regular rhythm.     Pulses: Normal pulses.     Heart sounds: Normal heart sounds.  Pulmonary:     Effort: No respiratory distress.     Breath sounds: Normal breath sounds.  Abdominal:     General: Bowel sounds are normal.     Palpations: Abdomen is soft.     Tenderness: There is no abdominal tenderness. There is no guarding.  Musculoskeletal: Normal range of motion.     Right knee: He exhibits swelling. He exhibits no effusion and no deformity. Tenderness found.     Comments: Mild to moderate crepitus with ROM of right knee  Lymphadenopathy:     Head:     Right side of head: No submandibular adenopathy.     Left side of head: No submandibular adenopathy.     Cervical: No cervical adenopathy.  Skin:    General: Skin is warm and dry.  Neurological:     Mental Status: He is alert and oriented to person, place, and  time.     Cranial Nerves: No cranial nerve deficit.     Sensory: No sensory deficit.  Psychiatric:        Speech: Speech normal.      ED Treatments / Results  Labs (all labs ordered are listed, but only abnormal results are displayed) Labs Reviewed - No data to display  EKG None  Radiology Dg Knee Complete 4 Views Right  Result Date: 02/04/2018 CLINICAL DATA:  Near fall.  Right knee swelling. EXAM: RIGHT  KNEE - COMPLETE 4+ VIEW COMPARISON:  11/30/2013. FINDINGS: No acute bony or joint abnormality identified. No evidence of fracture or dislocation. No prominent effusion. Prepatellar soft tissue swelling can not be excluded. Peripheral vascular calcification. IMPRESSION: 1. Prepatellar soft tissue swelling can not be excluded. No prominent effusion. No acute bony abnormality. No evidence of fracture or dislocation. 2.  Peripheral vascular disease. Electronically Signed   By: Marcello Moores  Register   On: 02/04/2018 12:43    Procedures Procedures (including critical care time)  Medications Ordered in ED Medications - No data to display   Initial Impression / Assessment and Plan / ED Course  I have reviewed the triage vital signs and the nursing notes.  Pertinent labs & imaging results that were available during my care of the patient were reviewed by me and considered in my medical decision making (see chart for details).      Final Clinical Impressions(s) / ED Diagnoses MDM  Vital signs within normal limits.  Pulse oximetry is 95% on room air.  Within normal limits by my interpretation.  Patient had a near fall and injured the right knee.  X-ray shows some prepatellar soft tissue swelling, but no significant effusion appreciated.  No bony abnormality noted.  There is no fracture and no dislocation.  It is of note that there is some peripheral vascular disease noted on the x-ray.  There are no hot joints noted, no evidence of any major infection.  I suspect that the patient has  strain of the knee as well as arthritic changes.  The patient will use a heating pad to the knee when at rest.  Prescription for meloxicam and Decadron given to the patient.  The patient will use Tylenol in between the meloxicam doses for pain.  I referred him to Dr. Aline Brochure for orthopedic evaluation and management.   Final diagnoses:  None    ED Discharge Orders         Ordered    meloxicam (MOBIC) 15 MG tablet  Daily     02/04/18 1628    dexamethasone (DECADRON) 4 MG tablet  2 times daily with meals     02/04/18 1628    traMADol (ULTRAM) 50 MG tablet  Every 6 hours PRN     02/04/18 1628           Lily Kocher, PA-C 02/04/18 1651    Lily Kocher, PA-C 02/04/18 2129    Margette Fast, MD 02/06/18 [phone]

## 2018-02-04 NOTE — ED Triage Notes (Signed)
Pt c/o RT knee swelling x 4 days. States he almost fell and caught himself, swelling began the next day. Pt ambulatory.

## 2018-02-04 NOTE — Discharge Instructions (Signed)
Heating pad to your knee may be helpful.  Please use the Mobic and Decadron daily.  Use Tylenol every 4 hours for pain if needed.  Use Ultram for more severe pain. This medication may cause drowsiness. Please do not drink, drive, or participate in activity that requires concentration while taking this medication.  Please see Dr. Aline Brochure, or the orthopedic specialist of your choice for additional evaluation and management of your knee pain and swelling.

## 2019-05-11 ENCOUNTER — Other Ambulatory Visit: Payer: Self-pay

## 2019-05-11 ENCOUNTER — Encounter (HOSPITAL_COMMUNITY): Payer: Self-pay

## 2019-05-11 ENCOUNTER — Emergency Department (HOSPITAL_COMMUNITY)
Admission: EM | Admit: 2019-05-11 | Discharge: 2019-05-11 | Disposition: A | Discharge status: Home / Self Care | Payer: Medicare Other | Attending: Emergency Medicine | Admitting: Emergency Medicine

## 2019-05-11 DIAGNOSIS — Y999 Unspecified external cause status: Secondary | ICD-10-CM | POA: Insufficient documentation

## 2019-05-11 DIAGNOSIS — Y9289 Other specified places as the place of occurrence of the external cause: Secondary | ICD-10-CM | POA: Insufficient documentation

## 2019-05-11 DIAGNOSIS — I110 Hypertensive heart disease with heart failure: Secondary | ICD-10-CM | POA: Insufficient documentation

## 2019-05-11 DIAGNOSIS — W268XXA Contact with other sharp object(s), not elsewhere classified, initial encounter: Secondary | ICD-10-CM | POA: Diagnosis not present

## 2019-05-11 DIAGNOSIS — I509 Heart failure, unspecified: Secondary | ICD-10-CM | POA: Diagnosis not present

## 2019-05-11 DIAGNOSIS — Y9389 Activity, other specified: Secondary | ICD-10-CM | POA: Diagnosis not present

## 2019-05-11 DIAGNOSIS — S0101XA Laceration without foreign body of scalp, initial encounter: Secondary | ICD-10-CM | POA: Diagnosis not present

## 2019-05-11 DIAGNOSIS — Z79899 Other long term (current) drug therapy: Secondary | ICD-10-CM | POA: Insufficient documentation

## 2019-05-11 MED ORDER — LIDOCAINE-EPINEPHRINE (PF) 1 %-1:200000 IJ SOLN
10.0000 mL | Freq: Once | INTRAMUSCULAR | Status: AC
  Administered 2019-05-11: 10 mL
  Filled 2019-05-11: qty 30

## 2019-05-11 MED ORDER — AMLODIPINE BESYLATE 5 MG PO TABS
10.0000 mg | ORAL_TABLET | Freq: Once | ORAL | Status: AC
  Administered 2019-05-11: 10 mg via ORAL
  Filled 2019-05-11: qty 2

## 2019-05-11 MED ORDER — POVIDONE-IODINE 10 % EX SOLN
CUTANEOUS | Status: DC | PRN
  Filled 2019-05-11: qty 15

## 2019-05-11 NOTE — Discharge Instructions (Signed)
Keep your wound clean and dry.  You can do a quick soap and water wash of your scalp/hair starting tomorrow, but dry completely afterward.  Have your staples removed in 10 days.    Get rechecked for any sign of infection (redness,  Swelling,  Increased pain or drainage of purulent fluid).

## 2019-05-11 NOTE — ED Provider Notes (Signed)
[facility] EMERGENCY DEPARTMENT Provider Note   CSN: [place]:[id number] Arrival date & time: 05/11/19  [id number]     History Chief Complaint  Patient presents with  . Head Injury    Edward Rodgers is a 69 y.o. male with a history of CHF, hypertension, history of prostate cancer and hypothyroidism presenting first evaluation of scalp laceration. He was walking outdoors when he failed to duck underneath a sharp edge of his carport striking the top of his head causing laceration. He denies LOC, denies dizziness or confusion or headache since this event. He does have some aching at the site of the laceration. He has applied pressure to the wound site in was able to control the bleeding. His last tetanus was received within the last 5 years. He takes an adult aspirin daily, no other blood thinning medications. He denies any other complaint at this time.  It is noted that his blood pressure was very elevated upon first arrival. He states he has not taken his morning medications yet.  HPI     Past Medical History:  Diagnosis Date  . Cancer [facility]) 2002   prostate  . CHF (congestive heart failure) ([place])   . Gout   . Hypertension   . Thyroid disease     Patient Active Problem List   Diagnosis Date Noted  . Special screening for malignant neoplasms, colon 04/09/2017  . Dizziness 03/29/2013  . Cerebellar stroke, acute ([place]) 03/29/2013  . CHF (congestive heart failure) ([place]) 03/28/2013  . Vertigo 03/28/2013    Past Surgical History:  Procedure Laterality Date  . COLONOSCOPY N/A 07/15/2017   Procedure: COLONOSCOPY;  Surgeon: Rogene Houston, MD;  [facility];  Service: Endoscopy;  Laterality: N/A;  830  . POLYPECTOMY  07/15/2017   Procedure: POLYPECTOMY;  Surgeon: Rogene Houston, MD;  Location[facility];  Service: Endoscopy;;  colon  . PROSTATE SURGERY         No family history on file.  Social History   Tobacco Use  . Smoking status: Former Research scientist (life sciences)  . Smokeless tobacco:  Never Used  Substance Use Topics  . Alcohol use: No  . Drug use: No    Home Medications Prior to Admission medications   Medication Sig Start Date End Date Taking? Authorizing Provider  albuterol (PROVENTIL HFA;VENTOLIN HFA) 108 (90 Base) MCG/ACT inhaler Inhale 2 puffs into the lungs every 6 (six) hours as needed for wheezing or shortness of breath.    [provider]  allopurinol (ZYLOPRIM) 300 MG tablet Take 300 mg by mouth every morning.    [provider]  amLODipine (NORVASC) 10 MG tablet Take 10 mg by mouth every morning.    [provider]  aspirin 325 MG tablet Take 1 tablet (325 mg total) by mouth daily. 07/16/17   Rogene Houston, MD  Calcium Carb-Cholecalciferol (CALCIUM 600 + D PO) Take 1 tablet by mouth daily.    [provider]  dexamethasone (DECADRON) 4 MG tablet Take 1 tablet (4 mg total) by mouth 2 (two) times daily with a meal. 02/04/18   Lily Kocher, PA-C  hydrochlorothiazide (HYDRODIURIL) 25 MG tablet Take 25 mg by mouth daily.    [provider]  latanoprost (XALATAN) 0.005 % ophthalmic solution Place 1 drop into the right eye at bedtime.    [provider]  levothyroxine (SYNTHROID, LEVOTHROID) 50 MCG tablet Take 50 mcg by mouth daily before breakfast.    [provider]  meloxicam (MOBIC) 15  MG tablet Take 1 tablet (15 mg total) by mouth daily. 02/04/18   Lily Kocher, PA-C  metoprolol succinate (TOPROL-XL) 50 MG 24 hr tablet Take 50 mg by mouth daily. Take with or immediately following a meal.    [provider]  OVER THE COUNTER MEDICATION Apply 1 application topically daily as needed (PERI-Guard skin protectant).    [provider]  oxybutynin (DITROPAN) 5 MG tablet Take 5 mg by mouth 2 (two) times daily as needed for bladder spasms.     [provider]  traMADol (ULTRAM) 50 MG tablet Take 1 tablet (50 mg total) by mouth every 6 (six) hours as needed. 02/04/18   Lily Kocher,  PA-C    Allergies    Patient has no known allergies.  Review of Systems   Review of Systems  Constitutional: Negative for chills and fever.  Eyes: Negative.  Negative for visual disturbance.  Respiratory: Negative.   Cardiovascular: Negative.   Gastrointestinal: Negative for nausea and vomiting.  Genitourinary: Negative.   Musculoskeletal: Negative for arthralgias and neck pain.  Skin: Positive for wound. Negative for rash.  Neurological: Negative for dizziness, weakness, light-headedness, numbness and headaches.  Psychiatric/Behavioral: Negative.     Physical Exam Updated Vital Signs BP (!) 140/102   Pulse 77   Temp 98 F (36.7 C) (Oral)   Ht 5\' 9"  (1.753 m)   Wt 98 kg   SpO2 98%   BMI 31.90 kg/m   Physical Exam Constitutional:      Appearance: He is well-developed.  HENT:     Head: Normocephalic.     Comments: 3 cm linear laceration right parietal scalp, hemostatic. Subcutaneous. Cardiovascular:     Rate and Rhythm: Normal rate.  Pulmonary:     Effort: Pulmonary effort is normal.  Musculoskeletal:        General: No tenderness.  Skin:    Findings: Laceration present.  Neurological:     General: No focal deficit present.     Mental Status: He is alert and oriented to person, place, and time.     Cranial Nerves: No cranial nerve deficit.     Sensory: No sensory deficit.     Motor: No weakness.     ED Results / Procedures / Treatments   Labs (all labs ordered are listed, but only abnormal results are displayed) Labs Reviewed - No data to display  EKG None  Radiology No results found.  Procedures .Marland KitchenLaceration Repair  Date/Time: 05/11/2019 10:15 AM Performed by: Evalee Jefferson, PA-C Authorized by: Evalee Jefferson, PA-C   Consent:    Consent obtained:  Verbal   Consent given by:  Patient   Risks discussed:  Infection, need for additional repair, pain, poor cosmetic result and poor wound healing   Alternatives discussed:  No treatment and delayed  treatment Universal protocol:    Procedure explained and questions answered to patient or proxy's satisfaction: yes     Relevant documents present and verified: yes     Immediately prior to procedure, a time out was called: yes     Patient identity confirmed:  Verbally with patient Anesthesia (see MAR for exact dosages):    Anesthesia method:  Local infiltration   Local anesthetic:  Lidocaine 1% WITH epi Laceration details:    Location:  Scalp   Scalp location:  R parietal   Length (cm):  3   Depth (mm):  3 Repair type:    Repair type:  Simple Pre-procedure details:    Preparation:  Patient  was prepped and draped in usual sterile fashion Exploration:    Hemostasis achieved with:  Epinephrine   Wound exploration: wound explored through full range of motion     Contaminated: no   Treatment:    Area cleansed with:  Betadine   Amount of cleaning:  Standard   Irrigation solution:  Sterile water   Irrigation method:  Syringe Skin repair:    Repair method:  Staples   Number of staples:  7 Approximation:    Approximation:  Close Post-procedure details:    Patient tolerance of procedure:  Tolerated well, no immediate complications   (including critical care time)  Medications Ordered in ED Medications  povidone-iodine (BETADINE) 10 % external solution ( Topical Given 05/11/19 0932)  lidocaine-EPINEPHrine (XYLOCAINE-EPINEPHrine) 1 %-1:200000 (PF) injection 10 mL (10 mLs Other Given by Other 05/11/19 0933)  amLODipine (NORVASC) tablet 10 mg (10 mg Oral Given 05/11/19 [id number])    ED Course  I have reviewed the triage vital signs and the nursing notes.  Pertinent labs & imaging results that were available during my care of the patient were reviewed by me and considered in my medical decision making (see chart for details).    MDM Rules/Calculators/A&P                      Pt was given his am amlodipone (the only bp med listed prior to updating medication list).  He was advised to  take his other home medicines when he gets home but to hold the amlodipine this am.  Wound care instructions given.  Pt advised to have sutures removed in 10 days,  Return here sooner for any signs of infection including redness, swelling, worse pain or drainage of pus.  Pt did not have a significant head injury requiring imaging.  No neuro deficits, no complaints of any sx other than localized scalp discomfort.    Final Clinical Impression(s) / ED Diagnoses Final diagnoses:  Laceration of scalp, initial encounter    Rx / DC Orders ED Discharge Orders    None       Landis Martins 05/11/19 1019    Elnora Morrison, MD 05/11/19 1038

## 2019-05-11 NOTE — ED Triage Notes (Signed)
Pt reports struck head on a metal carport.  No loss of consciousness, no dizziness.  Pt has laceration to top/right side of head.  Bleeding controlled with pressure.

## 2019-05-11 NOTE — ED Notes (Signed)
Pt said his blood pressure is high because he hadn't had the chance to take his medication this morning.

## 2019-09-01 ENCOUNTER — Other Ambulatory Visit: Payer: Medicare Other

## 2019-09-01 ENCOUNTER — Other Ambulatory Visit: Payer: Self-pay

## 2019-09-01 DIAGNOSIS — Z20822 Contact with and (suspected) exposure to covid-19: Secondary | ICD-10-CM

## 2019-09-02 LAB — NOVEL CORONAVIRUS, NAA: SARS-CoV-2, NAA: NOT DETECTED

## 2019-09-02 LAB — SARS-COV-2, NAA 2 DAY TAT

## 2019-10-19 ENCOUNTER — Encounter (HOSPITAL_COMMUNITY): Payer: Self-pay | Admitting: *Deleted

## 2019-10-19 ENCOUNTER — Emergency Department (HOSPITAL_COMMUNITY)
Admission: EM | Admit: 2019-10-19 | Discharge: 2019-10-20 | Disposition: A | Discharge status: Home / Self Care | Payer: Medicare Other | Attending: Emergency Medicine | Admitting: Emergency Medicine

## 2019-10-19 ENCOUNTER — Other Ambulatory Visit: Payer: Self-pay

## 2019-10-19 DIAGNOSIS — Z8546 Personal history of malignant neoplasm of prostate: Secondary | ICD-10-CM | POA: Insufficient documentation

## 2019-10-19 DIAGNOSIS — Z87891 Personal history of nicotine dependence: Secondary | ICD-10-CM | POA: Insufficient documentation

## 2019-10-19 DIAGNOSIS — I110 Hypertensive heart disease with heart failure: Secondary | ICD-10-CM | POA: Diagnosis not present

## 2019-10-19 DIAGNOSIS — I509 Heart failure, unspecified: Secondary | ICD-10-CM | POA: Insufficient documentation

## 2019-10-19 DIAGNOSIS — K625 Hemorrhage of anus and rectum: Secondary | ICD-10-CM | POA: Diagnosis not present

## 2019-10-19 DIAGNOSIS — Z7982 Long term (current) use of aspirin: Secondary | ICD-10-CM | POA: Diagnosis not present

## 2019-10-19 DIAGNOSIS — Z79899 Other long term (current) drug therapy: Secondary | ICD-10-CM | POA: Insufficient documentation

## 2019-10-19 LAB — TYPE AND SCREEN
ABO/RH(D): O NEG
Antibody Screen: NEGATIVE

## 2019-10-19 LAB — CBC
HCT: 43.5 % (ref 39.0–52.0)
Hemoglobin: 14 g/dL (ref 13.0–17.0)
MCH: 27.6 pg (ref 26.0–34.0)
MCHC: 32.2 g/dL (ref 30.0–36.0)
MCV: 85.6 fL (ref 80.0–100.0)
Platelets: 199 10*3/uL (ref 150–400)
RBC: 5.08 MIL/uL (ref 4.22–5.81)
RDW: 14.7 % (ref 11.5–15.5)
WBC: 4.7 10*3/uL (ref 4.0–10.5)
nRBC: 0 % (ref 0.0–0.2)

## 2019-10-19 LAB — COMPREHENSIVE METABOLIC PANEL
ALT: 19 U/L (ref 0–44)
AST: 19 U/L (ref 15–41)
Albumin: 4 g/dL (ref 3.5–5.0)
Alkaline Phosphatase: 41 U/L (ref 38–126)
Anion gap: 13 (ref 5–15)
BUN: 21 mg/dL (ref 8–23)
CO2: 27 mmol/L (ref 22–32)
Calcium: 9.2 mg/dL (ref 8.9–10.3)
Chloride: 98 mmol/L (ref 98–111)
Creatinine, Ser: 0.95 mg/dL (ref 0.61–1.24)
GFR calc non Af Amer: >60 mL/min (ref >60)
Glucose, Bld: 85 mg/dL (ref 70–99)
Potassium: 2.8 mmol/L — ABNORMAL LOW (ref 3.5–5.1)
Sodium: 138 mmol/L (ref 135–145)
Total Bilirubin: 0.6 mg/dL (ref 0.3–1.2)
Total Protein: 7.3 g/dL (ref 6.5–8.1)

## 2019-10-19 LAB — ABO/RH: ABO/RH(D): O NEG

## 2019-10-19 LAB — MAGNESIUM: Magnesium: 2 mg/dL (ref 1.7–2.4)

## 2019-10-19 NOTE — ED Triage Notes (Signed)
Pt c/o bright red rectal bleeding that started this morning. Pt reports taking ASA 325mg  daily.

## 2019-10-20 LAB — HEMOGLOBIN AND HEMATOCRIT, BLOOD
HCT: 46.2 % (ref 39.0–52.0)
Hemoglobin: 14.8 g/dL (ref 13.0–17.0)

## 2019-10-20 MED ORDER — HYDROCORTISONE (PERIANAL) 2.5 % EX CREA: 1.0000 "application " | TOPICAL_CREAM | Freq: Two times a day (BID) | CUTANEOUS | 0 refills | Status: DC

## 2019-10-20 NOTE — ED Provider Notes (Signed)
[facility] EMERGENCY DEPARTMENT Provider Note   CSN: [id number] Arrival date & time: 10/19/19  9381     History Chief Complaint  Patient presents with  . Rectal Bleeding    Edward Rodgers is a 69 y.o. male.  Pt presents to the ED today with rectal bleeding.  Pt said he noticed it this morning.  It was some blood on the toilet paper when he wiped.  He denies any pain.  No constipation.  Pt said it happened again when he had a bm, so he came to the ED.  He had his most recent colonoscopy by Dr. Laural Golden in July of 2019.  He had a small polyp which was not cancerous and external hemorrhoids.  Pt is not on blood thinners.  He does take ASA.          Past Medical History:  Diagnosis Date  . Cancer [facility]) 2002   prostate  . CHF (congestive heart failure) ([place])   . Gout   . Hypertension   . Thyroid disease     Patient Active Problem List   Diagnosis Date Noted  . Special screening for malignant neoplasms, colon 04/09/2017  . Dizziness 03/29/2013  . Cerebellar stroke, acute ([place]) 03/29/2013  . CHF (congestive heart failure) ([place]) 03/28/2013  . Vertigo 03/28/2013    Past Surgical History:  Procedure Laterality Date  . COLONOSCOPY N/A 07/15/2017   Procedure: COLONOSCOPY;  Surgeon: Rogene Houston, MD;  Location: [facility]O [facility];  Service: Endoscopy;  Laterality: N/A;  830  . POLYPECTOMY  07/15/2017   Procedure: POLYPECTOMY;  Surgeon: Rogene Houston, MD;  Location: [facility] ENDO SU[facility];  Service: Endoscopy;;  colon  . PROSTATE SURGERY         No family history on file.  Social History   Tobacco Use  . Smoking status: Former Research scientist (life sciences)  . Smokeless tobacco: Never Used  Vaping Use  . Vaping Use: Never used  Substance Use Topics  . Alcohol use: No  . Drug use: No    Home Medications Prior to Admission medications   Medication Sig Start Date End Date Taking? Authorizing Provider  alendronate (FOSAMAX) 70 MG tablet Take 70 mg by mouth once a week. Take with a full glass of water  on an empty stomach.   Yes [provider]  allopurinol (ZYLOPRIM) 300 MG tablet Take 300 mg by mouth every morning.   Yes [provider]  amLODipine (NORVASC) 10 MG tablet Take 10 mg by mouth every morning.   Yes [provider]  aspirin 325 MG tablet Take 1 tablet (325 mg total) by mouth daily. 07/16/17  Yes Rehman, Mechele Dawley, MD  Calcium Carb-Cholecalciferol (CALCIUM 600 + D PO) Take 1 tablet by mouth daily.   Yes [provider]  gabapentin (NEURONTIN) 100 MG capsule Take 100 mg by mouth daily after supper.   Yes [provider]  ibuprofen (ADVIL) 800 MG tablet Take 800 mg by mouth every 8 (eight) hours as needed.   Yes [provider]  latanoprost (XALATAN) 0.005 % ophthalmic solution Place 1 drop into the right eye at bedtime.   Yes [provider]  levothyroxine (SYNTHROID, LEVOTHROID) 50 MCG tablet Take 50 mcg by mouth daily before breakfast.   Yes [provider]  lisinopril-hydrochlorothiazide (ZESTORETIC) 20-25 MG tablet Take 1 tablet by mouth daily.   Yes [provider]  meloxicam (MOBIC) 15 MG tablet Take 1 tablet (15 mg total) by mouth daily. 02/04/18  Yes  Lily Kocher, PA-C  metoprolol succinate (TOPROL-XL) 50 MG 24 hr tablet Take 50 mg by mouth daily. Take with or immediately following a meal.   Yes [provider]  oxybutynin (DITROPAN) 5 MG tablet Take 5 mg by mouth daily as needed for bladder spasms.    Yes [provider]  venlafaxine (EFFEXOR) 75 MG tablet Take 75 mg by mouth 2 (two) times daily.   Yes [provider]  dexamethasone (DECADRON) 4 MG tablet Take 1 tablet (4 mg total) by mouth 2 (two) times daily with a meal. Patient not taking: Reported on 10/19/2019 02/04/18   Lily Kocher, PA-C  hydrochlorothiazide (HYDRODIURIL) 25 MG tablet Take 25 mg by mouth daily.    [provider]  hydrocortisone (ANUSOL-HC) 2.5 % rectal cream Place 1 application rectally 2  (two) times daily. 10/20/19   Isla Pence, MD  traMADol (ULTRAM) 50 MG tablet Take 1 tablet (50 mg total) by mouth every 6 (six) hours as needed. Patient not taking: Reported on 10/19/2019 02/04/18   Lily Kocher, PA-C    Allergies    Patient has no known allergies.  Review of Systems   Review of Systems  Gastrointestinal: Positive for blood in stool.  All other systems reviewed and are negative.   Physical Exam Updated Vital Signs BP (!) 148/88   Pulse (!) 59   Temp 98.4 F (36.9 C) (Oral)   Resp 15   Ht 5\' 9"  (1.753 m)   Wt 98.9 kg   SpO2 97%   BMI 32.19 kg/m   Physical Exam Vitals and nursing note reviewed. Exam conducted with a chaperone present.  Constitutional:      Appearance: Normal appearance.  HENT:     Head: Normocephalic and atraumatic.     Right Ear: External ear normal.     Left Ear: External ear normal.     Nose: Nose normal.     Mouth/Throat:     Mouth: Mucous membranes are moist.     Pharynx: Oropharynx is clear.  Eyes:     Extraocular Movements: Extraocular movements intact.     Conjunctiva/sclera: Conjunctivae normal.     Pupils: Pupils are equal, round, and reactive to light.  Cardiovascular:     Rate and Rhythm: Normal rate and regular rhythm.     Pulses: Normal pulses.     Heart sounds: Normal heart sounds.  Pulmonary:     Effort: Pulmonary effort is normal.     Breath sounds: Normal breath sounds.  Abdominal:     General: Abdomen is flat. Bowel sounds are normal.     Palpations: Abdomen is soft.  Genitourinary:    Rectum: Guaiac result positive.     Comments: Blood on finger. Musculoskeletal:        General: Normal range of motion.     Cervical back: Normal range of motion and neck supple.  Skin:    General: Skin is warm.     Capillary Refill: Capillary refill takes less than 2 seconds.  Neurological:     General: No focal deficit present.     Mental Status: He is alert.  Psychiatric:        Mood and Affect: Mood normal.         Behavior: Behavior normal.        Thought Content: Thought content normal.        Judgment: Judgment normal.     ED Results / Procedures / Treatments   Labs (all labs ordered are listed,  but only abnormal results are displayed) Labs Reviewed  COMPREHENSIVE METABOLIC PANEL - Abnormal; Notable for the following components:      Result Value   Potassium 2.8 (*)    All other components within normal limits  CBC  MAGNESIUM  HEMOGLOBIN AND HEMATOCRIT, BLOOD  POC OCCULT BLOOD, ED  TYPE AND SCREEN  ABO/RH    EKG None  Radiology No results found.  Procedures Procedures (including critical care time)  Medications Ordered in ED Medications - No data to display  ED Course  I have reviewed the triage vital signs and the nursing notes.  Pertinent labs & imaging results that were available during my care of the patient were reviewed by me and considered in my medical decision making (see chart for details).    MDM Rules/Calculators/A&P                          Hemoglobin checked at 1723 on 10/6 and was 14.0.  It was rechecked on 10/7 at 0042 and was 14.8.  Pt's vitals have been good and he looks nontoxic.  Pt d/w Dr. Laural Golden (GI).  Pt can have close f/u with him.  Pt is told to return if worse.  Blood likely from hemorrhoid, so he is d/c with anusol.  Final Clinical Impression(s) / ED Diagnoses Final diagnoses:  Rectal bleeding    Rx / DC Orders ED Discharge Orders         Ordered    hydrocortisone (ANUSOL-HC) 2.5 % rectal cream  2 times daily        10/20/19 0108           Isla Pence, MD 10/20/19 0215

## 2022-04-17 ENCOUNTER — Other Ambulatory Visit (INDEPENDENT_AMBULATORY_CARE_PROVIDER_SITE_OTHER): Payer: Medicare Other

## 2022-04-17 ENCOUNTER — Ambulatory Visit: Payer: Medicare Other | Admitting: Orthopedic Surgery

## 2022-04-17 ENCOUNTER — Ambulatory Visit (HOSPITAL_BASED_OUTPATIENT_CLINIC_OR_DEPARTMENT_OTHER): Payer: Medicare Other | Admitting: Student

## 2022-04-17 ENCOUNTER — Encounter: Payer: Self-pay | Admitting: Orthopedic Surgery

## 2022-04-17 VITALS — BP 143/87 | HR 69 | Ht 69.0 in | Wt 220.0 lb

## 2022-04-17 DIAGNOSIS — M79644 Pain in right finger(s): Secondary | ICD-10-CM | POA: Diagnosis not present

## 2022-04-17 DIAGNOSIS — M20021 Boutonniere deformity of right finger(s): Secondary | ICD-10-CM

## 2022-04-17 NOTE — Progress Notes (Signed)
Office Visit Note   Patient: Edward Rodgers           Date of Birth: 1950-12-19           MRN: [id number] Visit Date: 04/17/2022 Requested by: Lemmie Evens, MD [place],  [place] [id number] PCP: Lemmie Evens, MD  Subjective: Chief Complaint  Patient presents with   Finger Injury    2 weeks ago fell pinky finger injury / deformity now has seen urgent care and Dr Karie Kirks no notes from either     HPI: 71 year old male injured 2 weeks ago secondary to a fall and injured his right small finger.  He was seen at urgent care no fracture was identified.  He brought a disc in but there were no images on it we had to repeat the x-ray.  He presents with a boutonniere deformity of his left small finger with swelling but no tenderness  Patient Active Problem List   Diagnosis Date Noted   Special screening for malignant neoplasms, colon 04/09/2017   Dizziness 03/29/2013   Cerebellar stroke, acute 03/29/2013   CHF (congestive heart failure) 03/28/2013   Vertigo 03/28/2013   Current Outpatient Medications  Medication Instructions   alendronate (FOSAMAX) 70 mg, Oral, Weekly, Take with a full glass of water on an empty stomach.   allopurinol (ZYLOPRIM) 300 mg, Every morning   amLODipine (NORVASC) 10 mg, Every morning   aspirin 325 mg, Oral, Daily   Calcium Carb-Cholecalciferol (CALCIUM 600 + D PO) 1 tablet, Oral, Daily   dexamethasone (DECADRON) 4 mg, Oral, 2 times daily with meals   gabapentin (NEURONTIN) 100 mg, Oral, Daily after supper   hydrochlorothiazide (HYDRODIURIL) 25 mg, Oral, Daily   hydrocortisone (ANUSOL-HC) 2.5 % rectal cream 1 application , Rectal, 2 times daily   ibuprofen (ADVIL) 800 mg, Oral, Every 8 hours PRN   latanoprost (XALATAN) 0.005 % ophthalmic solution 1 drop, Right Eye, Daily at bedtime   levothyroxine (SYNTHROID) 50 mcg, Oral, Daily before breakfast   lisinopril-hydrochlorothiazide (ZESTORETIC) 20-25 MG tablet 1 tablet, Oral, Daily   meloxicam  (MOBIC) 15 mg, Oral, Daily   metoprolol succinate (TOPROL-XL) 50 mg, Oral, Daily, Take with or immediately following a meal.   oxybutynin (DITROPAN) 5 mg, Oral, Daily PRN   traMADol (ULTRAM) 50 mg, Oral, Every 6 hours PRN   venlafaxine (EFFEXOR) 75 mg, Oral, 2 times daily                  ROS: Neurovascular exam intact  Assessment & Plan:  Images personally read and my interpretation : In-house images no fracture just a boutonniere deformity  Visit Diagnoses:  1. Central slip extensor tendon injury (boutonniere), right small finger   2. Pain in right finger(s)     Plan: 72 year old male 28-week old injury right small finger most likely central slip injury recommend referral to hand center  Follow-Up Instructions: Return for REFERRAL.   Orders:  No orders of the defined types were placed in this encounter.     Objective: Vital Signs: BP (!) 143/87   Pulse 69   Ht 5\' 9"  (1.753 m)   Wt 220 lb (99.8 kg)   BMI 32.49 kg/m   Physical Exam Vitals and nursing note reviewed.  Constitutional:      Appearance: Normal appearance.  HENT:     Head: Normocephalic and atraumatic.  Eyes:     General: No scleral icterus.       Right eye: No discharge.  Left eye: No discharge.     Extraocular Movements: Extraocular movements intact.     Conjunctiva/sclera: Conjunctivae normal.     Pupils: Pupils are equal, round, and reactive to light.  Cardiovascular:     Rate and Rhythm: Normal rate.     Pulses: Normal pulses.  Skin:    General: Skin is warm and dry.     Capillary Refill: Capillary refill takes less than 2 seconds.  Neurological:     General: No focal deficit present.     Mental Status: He is alert and oriented to person, place, and time.  Psychiatric:        Mood and Affect: Mood normal.        Behavior: Behavior normal.        Thought Content: Thought content normal.        Judgment: Judgment normal.      Ortho Exam  Patient has a classic boutonniere  deformity of the right small finger with passive extension to 0 degrees but no active extension. Specialty Comments:  No specialty comments available.  Imaging: DG Finger Little Right  Result Date: 04/17/2022 X-ray report Chief complaint deformity right small finger Images 3 views Reading: No fracture dislocation classic boutonniere deformity of the PIP joint consistent with central slip injury Impression: No fracture probable tendon injury    PMFS History: Patient Active Problem List   Diagnosis Date Noted   Special screening for malignant neoplasms, colon 04/09/2017   Dizziness 03/29/2013   Cerebellar stroke, acute 03/29/2013   CHF (congestive heart failure) 03/28/2013   Vertigo 03/28/2013   Past Medical History:  Diagnosis Date   Cancer 2002   prostate   CHF (congestive heart failure)    Gout    Hypertension    Thyroid disease     No family history on file.  Past Surgical History:  Procedure Laterality Date   COLONOSCOPY N/A 07/15/2017   Procedure: COLONOSCOPY;  Surgeon: Rogene Houston, MD;  [facility];  Service: Endoscopy;  Laterality: N/A;  830   POLYPECTOMY  07/15/2017   Procedure: POLYPECTOMY;  Surgeon: Rogene Houston, MD;  Location: [facility];  Service: Endoscopy;;  colon   PROSTATE SURGERY     Social History   Occupational History   Not on file  Tobacco Use   Smoking status: Former   Smokeless tobacco: Never  Vaping Use   Vaping Use: Never used  Substance and Sexual Activity   Alcohol use: No   Drug use: No   Sexual activity: Not on file

## 2022-04-17 NOTE — Patient Instructions (Signed)
[facility] will call you with appointment. If you have not heard from them in 2-3 business days call them to schedule [phone]   

## 2022-08-23 ENCOUNTER — Other Ambulatory Visit: Payer: Self-pay

## 2022-08-23 ENCOUNTER — Encounter (HOSPITAL_COMMUNITY): Payer: Self-pay | Admitting: Emergency Medicine

## 2022-08-23 ENCOUNTER — Emergency Department (HOSPITAL_COMMUNITY)
Admission: EM | Admit: 2022-08-23 | Discharge: 2022-08-23 | Disposition: A | Discharge status: Home / Self Care | Payer: Medicare Other | Attending: Emergency Medicine | Admitting: Emergency Medicine

## 2022-08-23 DIAGNOSIS — R5383 Other fatigue: Secondary | ICD-10-CM | POA: Diagnosis present

## 2022-08-23 DIAGNOSIS — Z7982 Long term (current) use of aspirin: Secondary | ICD-10-CM | POA: Diagnosis not present

## 2022-08-23 DIAGNOSIS — U071 COVID-19: Secondary | ICD-10-CM | POA: Insufficient documentation

## 2022-08-23 LAB — RESP PANEL BY RT-PCR (RSV, FLU A&B, COVID)  RVPGX2
Influenza A by PCR: NEGATIVE
Influenza B by PCR: NEGATIVE
Resp Syncytial Virus by PCR: NEGATIVE
SARS Coronavirus 2 by RT PCR: POSITIVE — AB

## 2022-08-23 LAB — BASIC METABOLIC PANEL
Anion gap: 12 (ref 5–15)
BUN: 22 mg/dL (ref 8–23)
CO2: 23 mmol/L (ref 22–32)
Calcium: 9.4 mg/dL (ref 8.9–10.3)
Chloride: 104 mmol/L (ref 98–111)
Creatinine, Ser: 1.4 mg/dL — ABNORMAL HIGH (ref 0.61–1.24)
GFR, Estimated: 54 mL/min — ABNORMAL LOW (ref >60)
Glucose, Bld: 100 mg/dL — ABNORMAL HIGH (ref 70–99)
Potassium: 4.2 mmol/L (ref 3.5–5.1)
Sodium: 139 mmol/L (ref 135–145)

## 2022-08-23 MED ORDER — PAXLOVID (150/100) 10 X 150 MG & 10 X 100MG PO TBPK
2.0000 | ORAL_TABLET | Freq: Two times a day (BID) | ORAL | 0 refills | Status: AC
Start: 2022-08-23 — End: 2022-08-28

## 2022-08-23 MED ORDER — ACETAMINOPHEN 325 MG PO TABS
650.0000 mg | ORAL_TABLET | Freq: Once | ORAL | Status: AC
  Administered 2022-08-23: 650 mg via ORAL
  Filled 2022-08-23: qty 2

## 2022-08-23 NOTE — ED Provider Notes (Signed)
EMERGENCY DEPARTMENT AT [facility] Provider Note   CSN: [id number] Arrival date & time: 08/23/22  0932     History {Add pertinent medical, surgical, social history, OB history to HPI:1} Chief Complaint  Patient presents with  . Cough  . Generalized Body Aches    Edward Rodgers is a 72 y.o. male.   Cough      Home Medications Prior to Admission medications   Medication Sig Start Date End Date Taking? Authorizing Provider  alendronate (FOSAMAX) 70 MG tablet Take 70 mg by mouth once a week. Take with a full glass of water on an empty stomach.    [provider]  allopurinol (ZYLOPRIM) 300 MG tablet Take 300 mg by mouth every morning.    [provider]  amLODipine (NORVASC) 10 MG tablet Take 10 mg by mouth every morning.    [provider]  aspirin 325 MG tablet Take 1 tablet (325 mg total) by mouth daily. 07/16/17   Malissa Hippo, MD  Calcium Carb-Cholecalciferol (CALCIUM 600 + D PO) Take 1 tablet by mouth daily.    [provider]  ibuprofen (ADVIL) 800 MG tablet Take 800 mg by mouth every 8 (eight) hours as needed.    [provider]  latanoprost (XALATAN) 0.005 % ophthalmic solution Place 1 drop into the right eye at bedtime.    [provider]  levothyroxine (SYNTHROID, LEVOTHROID) 50 MCG tablet Take 50 mcg by mouth daily before breakfast.    [provider]  lisinopril-hydrochlorothiazide (ZESTORETIC) 20-25 MG tablet Take 1 tablet by mouth daily.    [provider]  meloxicam (MOBIC) 15 MG tablet Take 1 tablet (15 mg total) by mouth daily. 02/04/18   Ivery Quale, PA-C  metoprolol succinate (TOPROL-XL) 50 MG 24 hr tablet Take 50 mg by mouth daily. Take with or immediately following a meal.    [provider]  mirabegron ER (MYRBETRIQ) 50 MG TB24 tablet Take 50 mg by mouth daily.    [provider]  sacubitril-valsartan (ENTRESTO) 97-103 MG Take 1 tablet by mouth  2 (two) times daily.    [provider]  spironolactone (ALDACTONE) 25 MG tablet Take 25 mg by mouth daily.    [provider]      Allergies    Patient has no known allergies.    Review of Systems   Review of Systems  Respiratory:  Positive for cough.     Physical Exam Updated Vital Signs BP (!) 156/108   Pulse (!) 111   Temp 100.2 F (37.9 C) (Oral)   Ht 5\' 9"  (1.753 m)   Wt 102.1 kg   SpO2 95%   BMI 33.23 kg/m  Physical Exam  ED Results / Procedures / Treatments   Labs (all labs ordered are listed, but only abnormal results are displayed) Labs Reviewed  RESP PANEL BY RT-PCR (RSV, FLU A&B, COVID)  RVPGX2    EKG None  Radiology No results found.  Procedures Procedures  {Document cardiac monitor, telemetry assessment procedure when appropriate:1}  Medications Ordered in ED Medications - No data to display  ED Course/ Medical Decision Making/ A&P   {   Click here for ABCD2, HEART and other calculatorsREFRESH Note before signing :1}                              Medical Decision Making  ***  {Document critical care time when appropriate:1} {  Document review of labs and clinical decision tools ie heart score, Chads2Vasc2 etc:1}  {Document your independent review of radiology images, and any outside records:1} {Document your discussion with family members, caretakers, and with consultants:1} {Document social determinants of health affecting pt's care:1} {Document your decision making why or why not admission, treatments were needed:1} Final Clinical Impression(s) / ED Diagnoses Final diagnoses:  None    Rx / DC Orders ED Discharge Orders     None

## 2022-08-23 NOTE — Discharge Instructions (Addendum)
You are taking care of you today.  You have COVID-19 infection.  Due to your risk factors we are prescribing Paxlovid.  Is important to know that they can potentially interact with your blood pressure medications, specifically theSacubitril-Valsartan.  Commendation is to continue taking your medications as usual but if you notice dizziness or low blood pressure to stop taking it the Paxlovid.  You should stop taking the Myrbetriq while on the [facility] primary care doctor, come back to the ER for new or worsening symptoms.  Make sure you stay at home for the next 5 days.

## 2022-08-23 NOTE — ED Triage Notes (Signed)
Pt complains of bodyaches, nonproductive cough that started yesterday. Tested positive at home for COVID.

## 2023-02-14 ENCOUNTER — Emergency Department (HOSPITAL_COMMUNITY): Payer: Medicare Other | Admitting: Anesthesiology

## 2023-02-14 ENCOUNTER — Emergency Department (HOSPITAL_COMMUNITY): Payer: Medicare Other

## 2023-02-14 ENCOUNTER — Inpatient Hospital Stay (HOSPITAL_COMMUNITY): Payer: Medicare Other

## 2023-02-14 ENCOUNTER — Other Ambulatory Visit: Payer: Self-pay

## 2023-02-14 ENCOUNTER — Inpatient Hospital Stay (HOSPITAL_COMMUNITY): Payer: Medicare Other | Admitting: Anesthesiology

## 2023-02-14 ENCOUNTER — Encounter (HOSPITAL_COMMUNITY)
Admission: EM | Disposition: A | Discharge status: Rehabilitation Facility | Payer: Self-pay | Source: Home / Self Care | Attending: Neurology

## 2023-02-14 ENCOUNTER — Inpatient Hospital Stay (HOSPITAL_COMMUNITY)
Admission: EM | Admit: 2023-02-14 | Discharge: 2023-02-21 | DRG: 023 | Disposition: A | Discharge status: Rehabilitation Facility | Payer: Medicare Other | Attending: Internal Medicine | Admitting: Internal Medicine

## 2023-02-14 ENCOUNTER — Encounter (HOSPITAL_COMMUNITY): Payer: Self-pay | Admitting: Neurology

## 2023-02-14 DIAGNOSIS — R1310 Dysphagia, unspecified: Secondary | ICD-10-CM | POA: Diagnosis present

## 2023-02-14 DIAGNOSIS — Z7983 Long term (current) use of bisphosphonates: Secondary | ICD-10-CM

## 2023-02-14 DIAGNOSIS — E669 Obesity, unspecified: Secondary | ICD-10-CM | POA: Diagnosis present

## 2023-02-14 DIAGNOSIS — Z79899 Other long term (current) drug therapy: Secondary | ICD-10-CM

## 2023-02-14 DIAGNOSIS — F05 Delirium due to known physiological condition: Secondary | ICD-10-CM | POA: Diagnosis not present

## 2023-02-14 DIAGNOSIS — Z79890 Hormone replacement therapy (postmenopausal): Secondary | ICD-10-CM

## 2023-02-14 DIAGNOSIS — J9811 Atelectasis: Secondary | ICD-10-CM | POA: Diagnosis not present

## 2023-02-14 DIAGNOSIS — E039 Hypothyroidism, unspecified: Secondary | ICD-10-CM | POA: Diagnosis present

## 2023-02-14 DIAGNOSIS — D72823 Leukemoid reaction: Secondary | ICD-10-CM | POA: Diagnosis not present

## 2023-02-14 DIAGNOSIS — R4189 Other symptoms and signs involving cognitive functions and awareness: Secondary | ICD-10-CM | POA: Diagnosis not present

## 2023-02-14 DIAGNOSIS — I6389 Other cerebral infarction: Secondary | ICD-10-CM | POA: Diagnosis not present

## 2023-02-14 DIAGNOSIS — R071 Chest pain on breathing: Secondary | ICD-10-CM | POA: Diagnosis not present

## 2023-02-14 DIAGNOSIS — K5901 Slow transit constipation: Secondary | ICD-10-CM | POA: Diagnosis not present

## 2023-02-14 DIAGNOSIS — I110 Hypertensive heart disease with heart failure: Secondary | ICD-10-CM | POA: Diagnosis present

## 2023-02-14 DIAGNOSIS — Z6832 Body mass index (BMI) 32.0-32.9, adult: Secondary | ICD-10-CM | POA: Diagnosis not present

## 2023-02-14 DIAGNOSIS — G8194 Hemiplegia, unspecified affecting left nondominant side: Secondary | ICD-10-CM | POA: Diagnosis present

## 2023-02-14 DIAGNOSIS — I63533 Cerebral infarction due to unspecified occlusion or stenosis of bilateral posterior cerebral arteries: Principal | ICD-10-CM | POA: Diagnosis present

## 2023-02-14 DIAGNOSIS — H532 Diplopia: Secondary | ICD-10-CM | POA: Diagnosis present

## 2023-02-14 DIAGNOSIS — Z5309 Procedure and treatment not carried out because of other contraindication: Secondary | ICD-10-CM | POA: Diagnosis present

## 2023-02-14 DIAGNOSIS — E860 Dehydration: Secondary | ICD-10-CM | POA: Diagnosis present

## 2023-02-14 DIAGNOSIS — Z8679 Personal history of other diseases of the circulatory system: Secondary | ICD-10-CM | POA: Diagnosis not present

## 2023-02-14 DIAGNOSIS — E785 Hyperlipidemia, unspecified: Secondary | ICD-10-CM | POA: Diagnosis present

## 2023-02-14 DIAGNOSIS — M4802 Spinal stenosis, cervical region: Secondary | ICD-10-CM | POA: Diagnosis present

## 2023-02-14 DIAGNOSIS — J439 Emphysema, unspecified: Secondary | ICD-10-CM | POA: Diagnosis not present

## 2023-02-14 DIAGNOSIS — I6621 Occlusion and stenosis of right posterior cerebral artery: Secondary | ICD-10-CM

## 2023-02-14 DIAGNOSIS — I10 Essential (primary) hypertension: Secondary | ICD-10-CM

## 2023-02-14 DIAGNOSIS — I5022 Chronic systolic (congestive) heart failure: Secondary | ICD-10-CM | POA: Diagnosis present

## 2023-02-14 DIAGNOSIS — G936 Cerebral edema: Secondary | ICD-10-CM | POA: Diagnosis present

## 2023-02-14 DIAGNOSIS — I5032 Chronic diastolic (congestive) heart failure: Secondary | ICD-10-CM | POA: Diagnosis not present

## 2023-02-14 DIAGNOSIS — R29711 NIHSS score 11: Secondary | ICD-10-CM | POA: Diagnosis present

## 2023-02-14 DIAGNOSIS — I82432 Acute embolism and thrombosis of left popliteal vein: Secondary | ICD-10-CM | POA: Diagnosis not present

## 2023-02-14 DIAGNOSIS — R471 Dysarthria and anarthria: Secondary | ICD-10-CM | POA: Diagnosis present

## 2023-02-14 DIAGNOSIS — Z7984 Long term (current) use of oral hypoglycemic drugs: Secondary | ICD-10-CM

## 2023-02-14 DIAGNOSIS — K5900 Constipation, unspecified: Secondary | ICD-10-CM | POA: Diagnosis present

## 2023-02-14 DIAGNOSIS — Z7902 Long term (current) use of antithrombotics/antiplatelets: Secondary | ICD-10-CM

## 2023-02-14 DIAGNOSIS — M109 Gout, unspecified: Secondary | ICD-10-CM | POA: Diagnosis present

## 2023-02-14 DIAGNOSIS — I63531 Cerebral infarction due to unspecified occlusion or stenosis of right posterior cerebral artery: Secondary | ICD-10-CM | POA: Diagnosis present

## 2023-02-14 DIAGNOSIS — I639 Cerebral infarction, unspecified: Principal | ICD-10-CM

## 2023-02-14 DIAGNOSIS — E871 Hypo-osmolality and hyponatremia: Secondary | ICD-10-CM | POA: Diagnosis not present

## 2023-02-14 DIAGNOSIS — F54 Psychological and behavioral factors associated with disorders or diseases classified elsewhere: Secondary | ICD-10-CM | POA: Diagnosis not present

## 2023-02-14 DIAGNOSIS — I5042 Chronic combined systolic (congestive) and diastolic (congestive) heart failure: Secondary | ICD-10-CM | POA: Diagnosis not present

## 2023-02-14 DIAGNOSIS — N179 Acute kidney failure, unspecified: Secondary | ICD-10-CM | POA: Diagnosis present

## 2023-02-14 DIAGNOSIS — R0602 Shortness of breath: Secondary | ICD-10-CM | POA: Diagnosis not present

## 2023-02-14 DIAGNOSIS — H53462 Homonymous bilateral field defects, left side: Secondary | ICD-10-CM | POA: Diagnosis present

## 2023-02-14 DIAGNOSIS — L89312 Pressure ulcer of right buttock, stage 2: Secondary | ICD-10-CM | POA: Diagnosis present

## 2023-02-14 DIAGNOSIS — R072 Precordial pain: Secondary | ICD-10-CM | POA: Diagnosis not present

## 2023-02-14 DIAGNOSIS — Z7982 Long term (current) use of aspirin: Secondary | ICD-10-CM

## 2023-02-14 DIAGNOSIS — H539 Unspecified visual disturbance: Secondary | ICD-10-CM | POA: Diagnosis not present

## 2023-02-14 DIAGNOSIS — I2699 Other pulmonary embolism without acute cor pulmonale: Secondary | ICD-10-CM | POA: Diagnosis not present

## 2023-02-14 DIAGNOSIS — Z791 Long term (current) use of non-steroidal anti-inflammatories (NSAID): Secondary | ICD-10-CM

## 2023-02-14 DIAGNOSIS — Z8546 Personal history of malignant neoplasm of prostate: Secondary | ICD-10-CM | POA: Diagnosis not present

## 2023-02-14 DIAGNOSIS — D320 Benign neoplasm of cerebral meninges: Secondary | ICD-10-CM | POA: Diagnosis present

## 2023-02-14 DIAGNOSIS — R569 Unspecified convulsions: Secondary | ICD-10-CM | POA: Diagnosis not present

## 2023-02-14 DIAGNOSIS — I69354 Hemiplegia and hemiparesis following cerebral infarction affecting left non-dominant side: Secondary | ICD-10-CM | POA: Diagnosis not present

## 2023-02-14 DIAGNOSIS — R404 Transient alteration of awareness: Secondary | ICD-10-CM | POA: Diagnosis not present

## 2023-02-14 DIAGNOSIS — N4889 Other specified disorders of penis: Secondary | ICD-10-CM | POA: Diagnosis not present

## 2023-02-14 DIAGNOSIS — G4700 Insomnia, unspecified: Secondary | ICD-10-CM | POA: Diagnosis not present

## 2023-02-14 DIAGNOSIS — Z8673 Personal history of transient ischemic attack (TIA), and cerebral infarction without residual deficits: Secondary | ICD-10-CM

## 2023-02-14 HISTORY — PX: RADIOLOGY WITH ANESTHESIA: SHX6223

## 2023-02-14 HISTORY — PX: IR CT HEAD LTD: IMG2386

## 2023-02-14 HISTORY — PX: IR PERCUTANEOUS ART THROMBECTOMY/INFUSION INTRACRANIAL INC DIAG ANGIO: IMG6087

## 2023-02-14 LAB — URINALYSIS, MICROSCOPIC (REFLEX)
RBC / HPF: NONE SEEN RBC/hpf (ref 0–5)
Squamous Epithelial / HPF: NONE SEEN /[HPF] (ref 0–5)
WBC, UA: NONE SEEN WBC/hpf (ref 0–5)

## 2023-02-14 LAB — COMPREHENSIVE METABOLIC PANEL
ALT: 19 U/L (ref 0–44)
AST: 19 U/L (ref 15–41)
Albumin: 3.6 g/dL (ref 3.5–5.0)
Alkaline Phosphatase: 46 U/L (ref 38–126)
Anion gap: 10 (ref 5–15)
BUN: 24 mg/dL — ABNORMAL HIGH (ref 8–23)
CO2: 22 mmol/L (ref 22–32)
Calcium: 9.4 mg/dL (ref 8.9–10.3)
Chloride: 105 mmol/L (ref 98–111)
Creatinine, Ser: 1.2 mg/dL (ref 0.61–1.24)
GFR, Estimated: >60 mL/min (ref >60)
Glucose, Bld: 115 mg/dL — ABNORMAL HIGH (ref 70–99)
Potassium: 4 mmol/L (ref 3.5–5.1)
Sodium: 137 mmol/L (ref 135–145)
Total Bilirubin: 0.2 mg/dL (ref 0.0–1.2)
Total Protein: 6.8 g/dL (ref 6.5–8.1)

## 2023-02-14 LAB — DIFFERENTIAL
Abs Immature Granulocytes: 0.02 10*3/uL (ref 0.00–0.07)
Basophils Absolute: 0 10*3/uL (ref 0.0–0.1)
Basophils Relative: 1 %
Eosinophils Absolute: 0.2 10*3/uL (ref 0.0–0.5)
Eosinophils Relative: 4 %
Immature Granulocytes: 0 %
Lymphocytes Relative: 23 %
Lymphs Abs: 1.2 10*3/uL (ref 0.7–4.0)
Monocytes Absolute: 0.7 10*3/uL (ref 0.1–1.0)
Monocytes Relative: 12 %
Neutro Abs: 3.2 10*3/uL (ref 1.7–7.7)
Neutrophils Relative %: 60 %

## 2023-02-14 LAB — CBC
HCT: 41.6 % (ref 39.0–52.0)
Hemoglobin: 13.3 g/dL (ref 13.0–17.0)
MCH: 28.3 pg (ref 26.0–34.0)
MCHC: 32 g/dL (ref 30.0–36.0)
MCV: 88.5 fL (ref 80.0–100.0)
Platelets: 177 10*3/uL (ref 150–400)
RBC: 4.7 MIL/uL (ref 4.22–5.81)
RDW: 14.6 % (ref 11.5–15.5)
WBC: 5.3 10*3/uL (ref 4.0–10.5)
nRBC: 0 % (ref 0.0–0.2)

## 2023-02-14 LAB — POCT I-STAT, CHEM 8
BUN: 34 mg/dL — ABNORMAL HIGH (ref 8–23)
Calcium, Ion: 1.17 mmol/L (ref 1.15–1.40)
Chloride: 105 mmol/L (ref 98–111)
Creatinine, Ser: 1.3 mg/dL — ABNORMAL HIGH (ref 0.61–1.24)
Glucose, Bld: 114 mg/dL — ABNORMAL HIGH (ref 70–99)
HCT: 44 % (ref 39.0–52.0)
Hemoglobin: 15 g/dL (ref 13.0–17.0)
Potassium: 4.3 mmol/L (ref 3.5–5.1)
Sodium: 139 mmol/L (ref 135–145)
TCO2: 26 mmol/L (ref 22–32)

## 2023-02-14 LAB — RAPID URINE DRUG SCREEN, HOSP PERFORMED
Amphetamines: NOT DETECTED
Barbiturates: NOT DETECTED
Benzodiazepines: NOT DETECTED
Cocaine: NOT DETECTED
Opiates: NOT DETECTED
Tetrahydrocannabinol: NOT DETECTED

## 2023-02-14 LAB — URINALYSIS, ROUTINE W REFLEX MICROSCOPIC
Bilirubin Urine: NEGATIVE
Glucose, UA: >=500 mg/dL — AB
Hgb urine dipstick: NEGATIVE
Ketones, ur: NEGATIVE mg/dL
Leukocytes,Ua: NEGATIVE
Nitrite: NEGATIVE
Protein, ur: NEGATIVE mg/dL
Specific Gravity, Urine: 1.01 (ref 1.005–1.030)
pH: 6 (ref 5.0–8.0)

## 2023-02-14 LAB — PROTIME-INR
INR: 1 (ref 0.8–1.2)
Prothrombin Time: 12.9 s (ref 11.4–15.2)

## 2023-02-14 LAB — APTT: aPTT: 23 s — ABNORMAL LOW (ref 24–36)

## 2023-02-14 LAB — CBG MONITORING, ED: Glucose-Capillary: 99 mg/dL (ref 70–99)

## 2023-02-14 LAB — ETHANOL: Alcohol, Ethyl (B): <10 mg/dL (ref <10)

## 2023-02-14 LAB — MRSA NEXT GEN BY PCR, NASAL: MRSA by PCR Next Gen: NOT DETECTED

## 2023-02-14 SURGERY — IR WITH ANESTHESIA
Anesthesia: General

## 2023-02-14 SURGERY — IR WITH ANESTHESIA
Anesthesia: General | Laterality: Left

## 2023-02-14 MED ORDER — PANTOPRAZOLE SODIUM 40 MG IV SOLR
40.0000 mg | Freq: Every day | INTRAVENOUS | Status: DC
  Administered 2023-02-14: 40 mg via INTRAVENOUS
  Filled 2023-02-14: qty 10

## 2023-02-14 MED ORDER — ACETAMINOPHEN 160 MG/5ML PO SOLN: 650.0000 mg | ORAL | Status: DC | PRN

## 2023-02-14 MED ORDER — NITROGLYCERIN 1 MG/10 ML FOR IR/CATH LAB
INTRA_ARTERIAL | Status: AC
  Filled 2023-02-14: qty 10

## 2023-02-14 MED ORDER — CLEVIDIPINE BUTYRATE 0.5 MG/ML IV EMUL
0.0000 mg/h | INTRAVENOUS | Status: DC
  Administered 2023-02-14: 10 mg/h via INTRAVENOUS
  Administered 2023-02-14: 4 mg/h via INTRAVENOUS
  Administered 2023-02-14: 2 mg/h via INTRAVENOUS
  Administered 2023-02-14: 10 mg/h via INTRAVENOUS
  Administered 2023-02-15 (×2): 12 mg/h via INTRAVENOUS
  Administered 2023-02-15: 8 mg/h via INTRAVENOUS
  Filled 2023-02-14 (×4): qty 50
  Filled 2023-02-14: qty 100
  Filled 2023-02-14: qty 50

## 2023-02-14 MED ORDER — LABETALOL HCL 5 MG/ML IV SOLN
10.0000 mg | INTRAVENOUS | Status: DC | PRN
  Administered 2023-02-14 (×2): 10 mg via INTRAVENOUS
  Filled 2023-02-14 (×2): qty 4

## 2023-02-14 MED ORDER — AMISULPRIDE (ANTIEMETIC) 5 MG/2ML IV SOLN
10.0000 mg | Freq: Once | INTRAVENOUS | Status: AC
  Administered 2023-02-14: 10 mg via INTRAVENOUS

## 2023-02-14 MED ORDER — LIDOCAINE HCL (CARDIAC) PF 100 MG/5ML IV SOSY
PREFILLED_SYRINGE | INTRAVENOUS | Status: DC | PRN
  Administered 2023-02-14: 80 mg via INTRAVENOUS

## 2023-02-14 MED ORDER — SUCCINYLCHOLINE CHLORIDE 200 MG/10ML IV SOSY
PREFILLED_SYRINGE | INTRAVENOUS | Status: DC | PRN
  Administered 2023-02-14: 140 mg via INTRAVENOUS

## 2023-02-14 MED ORDER — ACETAMINOPHEN 650 MG RE SUPP: 650.0000 mg | RECTAL | Status: DC | PRN

## 2023-02-14 MED ORDER — ACETAMINOPHEN 325 MG PO TABS
650.0000 mg | ORAL_TABLET | ORAL | Status: DC | PRN
  Administered 2023-02-14 – 2023-02-21 (×8): 650 mg via ORAL
  Filled 2023-02-14 (×9): qty 2

## 2023-02-14 MED ORDER — ACETAMINOPHEN 10 MG/ML IV SOLN: 1000.0000 mg | Freq: Once | INTRAVENOUS | Status: DC | PRN

## 2023-02-14 MED ORDER — HEPARIN SODIUM (PORCINE) 1000 UNIT/ML IJ SOLN
INTRAMUSCULAR | Status: AC
Start: 2023-02-14 — End: ?
  Filled 2023-02-14: qty 10

## 2023-02-14 MED ORDER — IOHEXOL 350 MG/ML SOLN
100.0000 mL | Freq: Once | INTRAVENOUS | Status: AC | PRN
  Administered 2023-02-14: 100 mL via INTRAVENOUS

## 2023-02-14 MED ORDER — AMISULPRIDE (ANTIEMETIC) 5 MG/2ML IV SOLN
INTRAVENOUS | Status: AC
  Filled 2023-02-14: qty 4

## 2023-02-14 MED ORDER — SODIUM CHLORIDE 0.9 % IV SOLN: INTRAVENOUS | Status: DC

## 2023-02-14 MED ORDER — FENTANYL CITRATE PF 50 MCG/ML IJ SOSY: 25.0000 ug | PREFILLED_SYRINGE | INTRAMUSCULAR | Status: DC | PRN

## 2023-02-14 MED ORDER — LABETALOL HCL 5 MG/ML IV SOLN
10.0000 mg | Freq: Once | INTRAVENOUS | Status: AC
  Administered 2023-02-14: 10 mg via INTRAVENOUS

## 2023-02-14 MED ORDER — LABETALOL HCL 5 MG/ML IV SOLN
10.0000 mg | INTRAVENOUS | Status: DC | PRN
  Administered 2023-02-15 – 2023-02-16 (×9): 10 mg via INTRAVENOUS
  Filled 2023-02-14 (×10): qty 4

## 2023-02-14 MED ORDER — IOHEXOL 350 MG/ML SOLN
40.0000 mL | Freq: Once | INTRAVENOUS | Status: AC | PRN
  Administered 2023-02-14: 40 mL via INTRAVENOUS

## 2023-02-14 MED ORDER — CHLORHEXIDINE GLUCONATE CLOTH 2 % EX PADS
6.0000 | MEDICATED_PAD | Freq: Every day | CUTANEOUS | Status: DC
  Administered 2023-02-14 – 2023-02-21 (×5): 6 via TOPICAL

## 2023-02-14 MED ORDER — LATANOPROST 0.005 % OP SOLN
1.0000 [drp] | Freq: Every day | OPHTHALMIC | Status: DC
  Administered 2023-02-14 – 2023-02-20 (×7): 1 [drp] via OPHTHALMIC
  Filled 2023-02-14: qty 2.5

## 2023-02-14 MED ORDER — ACETAMINOPHEN 325 MG PO TABS: 650.0000 mg | ORAL_TABLET | ORAL | Status: DC | PRN

## 2023-02-14 MED ORDER — TENECTEPLASE FOR STROKE
0.2500 mg/kg | PACK | Freq: Once | INTRAVENOUS | Status: AC
  Administered 2023-02-14: 25 mg via INTRAVENOUS
  Filled 2023-02-14: qty 10

## 2023-02-14 MED ORDER — VERAPAMIL HCL 2.5 MG/ML IV SOLN
INTRAVENOUS | Status: AC
  Filled 2023-02-14: qty 2

## 2023-02-14 MED ORDER — SENNOSIDES-DOCUSATE SODIUM 8.6-50 MG PO TABS
1.0000 | ORAL_TABLET | Freq: Every evening | ORAL | Status: DC | PRN
  Administered 2023-02-18: 1 via ORAL
  Filled 2023-02-14: qty 1

## 2023-02-14 MED ORDER — IOHEXOL 300 MG/ML  SOLN
150.0000 mL | Freq: Once | INTRAMUSCULAR | Status: AC | PRN
  Administered 2023-02-14: 35 mL via INTRA_ARTERIAL

## 2023-02-14 MED ORDER — ORAL CARE MOUTH RINSE: 15.0000 mL | OROMUCOSAL | Status: DC | PRN

## 2023-02-14 MED ORDER — ROCURONIUM BROMIDE 100 MG/10ML IV SOLN
INTRAVENOUS | Status: DC | PRN
  Administered 2023-02-14: 50 mg via INTRAVENOUS

## 2023-02-14 MED ORDER — ESMOLOL HCL 100 MG/10ML IV SOLN
INTRAVENOUS | Status: DC | PRN
  Administered 2023-02-14 (×3): 10 mg via INTRAVENOUS

## 2023-02-14 MED ORDER — PROPOFOL 10 MG/ML IV BOLUS
INTRAVENOUS | Status: DC | PRN
  Administered 2023-02-14: 160 mg via INTRAVENOUS

## 2023-02-14 MED ORDER — LEVOTHYROXINE SODIUM 88 MCG PO TABS
88.0000 ug | ORAL_TABLET | Freq: Every day | ORAL | Status: DC
  Administered 2023-02-15 – 2023-02-21 (×7): 88 ug via ORAL
  Filled 2023-02-14 (×7): qty 1

## 2023-02-14 MED ORDER — LEVOTHYROXINE SODIUM 25 MCG PO TABS: 50.0000 ug | ORAL_TABLET | Freq: Every day | ORAL | Status: DC

## 2023-02-14 MED ORDER — LACTATED RINGERS IV SOLN: INTRAVENOUS | Status: DC | PRN

## 2023-02-14 MED ORDER — STROKE: EARLY STAGES OF RECOVERY BOOK: Freq: Once | Status: AC

## 2023-02-14 MED ORDER — SUGAMMADEX SODIUM 200 MG/2ML IV SOLN
INTRAVENOUS | Status: DC | PRN
  Administered 2023-02-14: 400 mg via INTRAVENOUS

## 2023-02-14 MED ORDER — PHENYLEPHRINE HCL (PRESSORS) 10 MG/ML IV SOLN
INTRAVENOUS | Status: DC | PRN
  Administered 2023-02-14 (×2): 80 ug via INTRAVENOUS
  Administered 2023-02-14: 160 ug via INTRAVENOUS
  Administered 2023-02-14: 80 ug via INTRAVENOUS

## 2023-02-14 MED ORDER — OYSTER SHELL CALCIUM/D3 500-5 MG-MCG PO TABS
1.0000 | ORAL_TABLET | Freq: Every day | ORAL | Status: DC
  Administered 2023-02-15 – 2023-02-21 (×7): 1 via ORAL
  Filled 2023-02-14 (×8): qty 1

## 2023-02-14 NOTE — Anesthesia Procedure Notes (Signed)
Procedure Name: Intubation Date/Time: 02/14/2023 10:00 AM  Performed by: Gloris Ham, CRNAPre-anesthesia Checklist: Patient identified, Emergency Drugs available, Suction available and Patient being monitored Patient Re-evaluated:Patient Re-evaluated prior to induction Oxygen Delivery Method: Circle System Utilized Preoxygenation: Pre-oxygenation with 100% oxygen Induction Type: IV induction Ventilation: Mask ventilation without difficulty Laryngoscope Size: Miller and 2 Tube type: Oral Tube size: 7.5 mm Number of attempts: 1 Airway Equipment and Method: Stylet and Oral airway Placement Confirmation: ETT inserted through vocal cords under direct vision, positive ETCO2 and breath sounds checked- equal and bilateral Tube secured with: Tape Dental Injury: Teeth and Oropharynx as per pre-operative assessment

## 2023-02-14 NOTE — ED Notes (Signed)
Blood tubed to station 99, main lab. KM

## 2023-02-14 NOTE — Progress Notes (Addendum)
Left lower extremity twitch noticed while patient was in CT scan. Beginning at 0848 - intermittently consistent. Patient states he cannot control it, doesn't express any complaints of pain. Dr. Roda Shutters notified and at bedside to assess.

## 2023-02-14 NOTE — Progress Notes (Signed)
SLP Cancellation Note  Patient Details Name: FOUNT BAHE MRN: [id number] DOB: Mar 29, 1950   Cancelled treatment:       Reason Eval/Treat Not Completed: Fatigue/lethargy limiting ability to participate; Pt not able to arouse enough for participation in clinical swallow evaluation; will continue to closely follow   Ardyth Gal MA, CCC-SLP Acute Rehabilitation Services   02/14/2023, 1:54 PM

## 2023-02-14 NOTE — Progress Notes (Signed)
Called by RN around 8am that pt NIHSS up to 11. According to previous note, pt NIHSS improved to 3 enroute to IR suite with only left arm and leg weakness. Pt was examined and NIHSS was 8 at that time. Stat CT ordered 0815 and done quickly at 0850, no hemorrhage. CTP also ordered at the same time and it showed no core and penumbra was 36, increased from previous 25. After CT, pt was re-examined and NIHSS persistent at 7. I then had long discussion with wife at the bedside, about potential thrombectomy risk and benefit, she was willing to proceed. I then discussed with Dr. Corliss Skains and again discussed with pt and his wife. Pt and wife willing to proceed and wife signed consent. Code IR activated. Pt was sent to IR suite with the consent.   Marvel Plan, MD PhD Stroke Neurology 02/14/2023 9:56 AM

## 2023-02-14 NOTE — ED Notes (Addendum)
Transported to IR 

## 2023-02-14 NOTE — ED Provider Notes (Signed)
[facility] AT [facility] Provider Note   CSN: [id number] Arrival date & time: 02/14/23  0358     History  Chief Complaint  Patient presents with   Code Stroke    LKW : 10 pm 02/13/23    Edward Rodgers is a 73 y.o. male.  Presents to the emergency department by EMS from home. Patient reports going to bed around 10PM and then waking up at 1AM, at which time he noted that he could not feel his left arm.  On arrival to the emergency department patient unable to move left arm or leg.       Home Medications Prior to Admission medications   Medication Sig Start Date End Date Taking? Authorizing Provider  alendronate (FOSAMAX) 70 MG tablet Take 70 mg by mouth once a week. Take with a full glass of water on an empty stomach.    [provider]  allopurinol (ZYLOPRIM) 300 MG tablet Take 300 mg by mouth every morning.    [provider]  amLODipine (NORVASC) 10 MG tablet Take 10 mg by mouth every morning.    [provider]  aspirin 325 MG tablet Take 1 tablet (325 mg total) by mouth daily. 07/16/17   Malissa Hippo, MD  Calcium Carb-Cholecalciferol (CALCIUM 600 + D PO) Take 1 tablet by mouth daily.    [provider]  ibuprofen (ADVIL) 800 MG tablet Take 800 mg by mouth every 8 (eight) hours as needed.    [provider]  latanoprost (XALATAN) 0.005 % ophthalmic solution Place 1 drop into the right eye at bedtime.    [provider]  levothyroxine (SYNTHROID, LEVOTHROID) 50 MCG tablet Take 50 mcg by mouth daily before breakfast.    [provider]  lisinopril-hydrochlorothiazide (ZESTORETIC) 20-25 MG tablet Take 1 tablet by mouth daily.    [provider]  meloxicam (MOBIC) 15 MG tablet Take 1 tablet (15 mg total) by mouth daily. 02/04/18   Ivery Quale, PA-C  metoprolol succinate (TOPROL-XL) 50 MG 24 hr tablet Take 50 mg by mouth daily. Take with or immediately following a meal.     [provider]  mirabegron ER (MYRBETRIQ) 50 MG TB24 tablet Take 50 mg by mouth daily.    [provider]  sacubitril-valsartan (ENTRESTO) 97-103 MG Take 1 tablet by mouth 2 (two) times daily.    [provider]  spironolactone (ALDACTONE) 25 MG tablet Take 25 mg by mouth daily.    [provider]      Allergies    Patient has no known allergies.    Review of Systems   Review of Systems  Physical Exam Updated Vital Signs BP (!) 180/108 (BP Location: Left Arm)   Pulse 65   Temp (!) 96.9 F (36.1 C) (Temporal)   Resp 19   Ht 5\' 9"  (1.753 m)   Wt 101.2 kg   SpO2 99%   BMI 32.95 kg/m  Physical Exam Vitals and nursing note reviewed.  Constitutional:      General: He is not in acute distress.    Appearance: He is well-developed.  HENT:     Head: Normocephalic and atraumatic.     Mouth/Throat:     Mouth: Mucous membranes are moist.  Eyes:     General: Vision grossly intact. Gaze aligned appropriately.     Extraocular Movements: Extraocular movements intact.     Conjunctiva/sclera: Conjunctivae normal.  Cardiovascular:     Rate and Rhythm: Normal  rate and regular rhythm.     Pulses: Normal pulses.     Heart sounds: Normal heart sounds, S1 normal and S2 normal. No murmur heard.    No friction rub. No gallop.  Pulmonary:     Effort: Pulmonary effort is normal. No respiratory distress.     Breath sounds: Normal breath sounds.  Abdominal:     Palpations: Abdomen is soft.     Tenderness: There is no abdominal tenderness. There is no guarding or rebound.     Hernia: No hernia is present.  Musculoskeletal:        General: No swelling.     Cervical back: Full passive range of motion without pain, normal range of motion and neck supple. No pain with movement, spinous process tenderness or muscular tenderness. Normal range of motion.     Right lower leg: No edema.     Left lower leg: No edema.  Skin:    General: Skin is warm and dry.      Capillary Refill: Capillary refill takes less than 2 seconds.     Findings: No ecchymosis, erythema, lesion or wound.  Neurological:     Mental Status: He is alert and oriented to person, place, and time.     GCS: GCS eye subscore is 4. GCS verbal subscore is 5. GCS motor subscore is 6.     Cranial Nerves: Dysarthria and facial asymmetry present.     Sensory: Sensory deficit present.     Motor: Weakness and abnormal muscle tone present.     Coordination: Coordination is intact.     Comments: Left hemparesis  Psychiatric:        Mood and Affect: Mood normal.        Speech: Speech normal.        Behavior: Behavior normal.     ED Results / Procedures / Treatments   Labs (all labs ordered are listed, but only abnormal results are displayed) Labs Reviewed  APTT - Abnormal; Notable for the following components:      Result Value   aPTT 23 (*)    All other components within normal limits  COMPREHENSIVE METABOLIC PANEL - Abnormal; Notable for the following components:   Glucose, Bld 115 (*)    BUN 24 (*)    All other components within normal limits  POCT I-STAT, CHEM 8 - Abnormal; Notable for the following components:   BUN 34 (*)    Creatinine, Ser 1.30 (*)    Glucose, Bld 114 (*)    All other components within normal limits  ETHANOL  PROTIME-INR  CBC  DIFFERENTIAL  RAPID URINE DRUG SCREEN, HOSP PERFORMED  URINALYSIS, ROUTINE W REFLEX MICROSCOPIC  CBG MONITORING, ED  I-STAT CHEM 8, ED    EKG None  Radiology CT ANGIO HEAD NECK W WO CM W PERF (CODE STROKE) Result Date: 02/14/2023 CLINICAL DATA:  Neuro deficit with acute stroke suspected. EXAM: CT ANGIOGRAPHY HEAD AND NECK CT PERFUSION BRAIN TECHNIQUE: Multidetector CT imaging of the head and neck was performed using the standard protocol during bolus administration of intravenous contrast. Multiplanar CT image reconstructions and MIPs were obtained to evaluate the vascular anatomy. Carotid stenosis measurements (when  applicable) are obtained utilizing NASCET criteria, using the distal internal carotid diameter as the denominator. Multiphase CT imaging of the brain was performed following IV bolus contrast injection. Subsequent parametric perfusion maps were calculated using RAPID software. RADIATION DOSE REDUCTION: This exam was performed according to the departmental dose-optimization program which includes automated  exposure control, adjustment of the mA and/or kV according to patient size and/or use of iterative reconstruction technique. CONTRAST:  Dose is not known on this in progress study. COMPARISON:  Head CT from earlier today FINDINGS: Aortic arch: Atheromatous plaque with 3 vessel branching. Right carotid system: Calcified plaque in the proximal ICA without significant stenosis, ulceration, or beading. Left carotid system: Mild atheromatous plaque for age mainly at the proximal ICA without stenosis or ulceration. Vertebral arteries: Proximal subclavian arteries. Calcified plaque at both vertebral ostia. No flow reducing stenosis, ulceration, or dissection. Skeleton: Generalized cervical spine degeneration with mild scoliosis. Other neck: No acute finding Upper chest: Emphysema and airway thickening. No acute finding. Review of the MIP images confirms the above findings CTA HEAD FINDINGS Limited by venous contamination. Anterior circulation: No emergent branch occlusion, segmental narrowing, aneurysm, or vascular malformation. There is diffuse atheromatous irregularity affecting the MCA and ACA distributions without correctable and proximal flow reducing stenosis. Posterior circulation: The vertebral and basilar arteries are atheromatous and tortuous without occlusion or flow reducing stenosis. Abrupt cut off at the right P2 segment with some downstream reconstitution. Venous sinuses: Unremarkable Anatomic variants: None significant CT Brain Perfusion Findings: ASPECTS: 10 CBF (<30%) Volume: 0mL Perfusion (Tmax>6.0s)  volume: 30mL-in the right PCA distribution Findings discussed with neurology before signing. IMPRESSION: 1. Right P2 occlusion, presumably acute in this setting. There is 25 cc of relative ischemia by CT perfusion in this distribution. 2. Extensive intracranial atherosclerosis. 3. Atherosclerosis in the neck without flow reducing stenosis or suspicious embolic source. Electronically Signed   By: Tiburcio Pea M.D.   On: 02/14/2023 04:43   CT HEAD CODE STROKE WO CONTRAST Result Date: 02/14/2023 CLINICAL DATA:  Code stroke. EXAM: CT HEAD WITHOUT CONTRAST TECHNIQUE: Contiguous axial images were obtained from the base of the skull through the vertex without intravenous contrast. RADIATION DOSE REDUCTION: This exam was performed according to the departmental dose-optimization program which includes automated exposure control, adjustment of the mA and/or kV according to patient size and/or use of iterative reconstruction technique. COMPARISON:  03/29/2013 FINDINGS: Brain: No evidence of acute infarction, hemorrhage, hydrocephalus, extra-axial collection. Partially calcified anterior left parafalcine mass consistent with meningioma, 3 cm in maximal size with mass effect on the local frontal lobe. No evidence of adjacent brain edema. Vascular: No hyperdense vessel or unexpected calcification. Skull: Normal. Negative for fracture or focal lesion. Sinuses/Orbits: No acute finding. Other: Prelim sent in epic chat. ASPECTS [facility] Stroke Program Early CT Score) Ten on both sides. IMPRESSION: 1. No acute finding. 2. 3 cm anterior left parafalcine meningioma that has enlarged from 2015. Electronically Signed   By: Tiburcio Pea M.D.   On: 02/14/2023 04:19    Procedures Procedures    Medications Ordered in ED Medications  iohexol (OMNIPAQUE) 350 MG/ML injection 100 mL (100 mLs Intravenous Contrast Given 02/14/23 0437)    ED Course/ Medical Decision Making/ A&P                                 Medical Decision  Making Amount and/or Complexity of Data Reviewed Labs: ordered. Radiology: ordered.  Risk Prescription drug management.   Differential diagnosis considered includes, but not limited to:  TIA; Stroke; seizure; metabolic encephalopathy  Arrives in the emergency department from home as a code stroke.  Patient's last known normal is likely 10 PM, therefore no thrombolytics.  Patient noted to have a dense left hemiparesis at arrival.  Screening CT shows previously identified meningioma, no acute stroke.  CT angiography identifies right P2 occlusion.  Patient to go to interventional radiology.  CRITICAL CARE Performed by: Gilda Crease   Total critical care time: 35 minutes  Critical care time was exclusive of separately billable procedures and treating other patients.  Critical care was necessary to treat or prevent imminent or life-threatening deterioration.  Critical care was time spent personally by me on the following activities: development of treatment plan with patient and/or surrogate as well as nursing, discussions with consultants, evaluation of patient's response to treatment, examination of patient, obtaining history from patient or surrogate, ordering and performing treatments and interventions, ordering and review of laboratory studies, ordering and review of radiographic studies, pulse oximetry and re-evaluation of patient's condition.         Final Clinical Impression(s) / ED Diagnoses Final diagnoses:  Cerebrovascular accident (CVA), unspecified mechanism (HCC)    Rx / DC Orders ED Discharge Orders     None         Anola Mcgough, Canary Brim, MD 02/14/23 [phone]

## 2023-02-14 NOTE — Progress Notes (Addendum)
STROKE TEAM PROGRESS NOTE   BRIEF HPI [name] is a 73 y.o. male withPMH significant for HTN, prior cerebellar stroke, hypothyroidism who was BIB EMS as a CODE STROKE 2/1 early AM due to left-sided weakness, numbness, dysconjugate gaze, slight R gaze preference and inconsistent left visual field testing. Per EMS, wife stated his LKW was around 0115.  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 Thrombectomy was discussed by neurologist Dr. Derry Lory, IR Dr. Corliss Skains and family who consented. However, patient improved from an NIH of 11 to an NIH of 3 en route to IR suite and it was felt that deficits were too small to intervene, but appropriate for TNKase.  STAT MRI showed diffusion restriction in R medial temporal with no signal change on FLAIR. At that time, NIH was fluctuating from 3-5 due to L arm and leg weakness fluctuating in intensity.   NIH on Admission: 11 NIH on re-eval: 3   SIGNIFICANT HOSPITAL EVENTS 2/1: IR procedure cancelled due to improvement in symptoms  TNK given @ 0536  Patient admitted to 4N ICU @ 0645  INTERIM HISTORY/SUBJECTIVE  Called by RN this am due to change in NIH, immediately came to bedside for assessment. Patient was scored as an 11 on her change-of-shift NIH. This score was consistent from the patient's admitting to 4N ICU. However, this score is different from the previously scored NIH after patient's symptoms were noted to vastly improve spontaneously before the thrombectomy was cancelled.  On exam, patient exhibits left hemianopia (does have some double vision at baseline), slight facial droop, left hemiparesis with distal strength greater tham proximal, dysarthria and sensory deficit. NIH: 8 Patient take down for STAT CT and also STAT CTP to reevaluate the previously seen occlusion.  CTP showed penumbra increased from 25-36.  Case discussed between Dr. Roda Shutters and Dr. Corliss Skains.  Thorough discussion held between Dr. Roda Shutters  and patient's family members who consented to IR procedure.  Patient was taken for emergent thrombectomy.  OBJECTIVE  CBC    Component Value Date/Time   WBC 5.3 02/14/2023 0406   RBC 4.70 02/14/2023 0406   HGB 15.0 02/14/2023 0407   HCT 44.0 02/14/2023 0407   PLT 177 02/14/2023 0406   MCV 88.5 02/14/2023 0406   MCH 28.3 02/14/2023 0406   MCHC 32.0 02/14/2023 0406   RDW 14.6 02/14/2023 0406   LYMPHSABS 1.2 02/14/2023 0406   MONOABS 0.7 02/14/2023 0406   EOSABS 0.2 02/14/2023 0406   BASOSABS 0.0 02/14/2023 0406    BMET    Component Value Date/Time   NA 139 02/14/2023 0407   K 4.3 02/14/2023 0407   CL 105 02/14/2023 0407   CO2 22 02/14/2023 0406   GLUCOSE 114 (H) 02/14/2023 0407   BUN 34 (H) 02/14/2023 0407   CREATININE 1.30 (H) 02/14/2023 0407   CALCIUM 9.4 02/14/2023 0406   GFRNONAA >60 02/14/2023 0406    IMAGING past 24 hours CT HEAD WO CONTRAST ( ) Result Date: 02/14/2023 CLINICAL DATA:  Stroke follow-up EXAM: CT HEAD WITHOUT CONTRAST TECHNIQUE: Contiguous axial images were obtained from the base of the skull through the vertex without intravenous contrast. RADIATION DOSE REDUCTION: This exam was performed according to the departmental dose-optimization program which includes automated exposure control, adjustment of the mA and/or kV according to patient size and/or use of iterative reconstruction technique. COMPARISON:  Brain MRI from earlier today FINDINGS: Brain: There is likely gray-white differentiation loss asymmetrically in the medial right temporal  lobe, site of restricted diffusion previously. The right thalamic MR signal abnormality has also become apparent. No hemorrhage, hydrocephalus, or shift. Chronic small vessel ischemia in the cerebral white matter and known partially calcified left anterior parafalcine meningioma. Vascular: Diffusely high-density vessels in the setting of recent contrast administration. Reference prior CTA. Skull: Normal. Negative for  fracture or focal lesion. Sinuses/Orbits: No acute finding. IMPRESSION: Subtle but likely cytotoxic edema now seen at areas of right temporal and thalamic diffusion abnormality. No intracranial hemorrhage. Electronically Signed   By: Tiburcio Pea M.D.   On: 02/14/2023 09:15   CT CEREBRAL PERFUSION W CONTRAST Result Date: 02/14/2023 CLINICAL DATA:  Stroke workup EXAM: CT PERFUSION BRAIN TECHNIQUE: Multiphase CT imaging of the brain was performed following IV bolus contrast injection. Subsequent parametric perfusion maps were calculated using RAPID software. RADIATION DOSE REDUCTION: This exam was performed according to the departmental dose-optimization program which includes automated exposure control, adjustment of the mA and/or kV according to patient size and/or use of iterative reconstruction technique. CONTRAST:  40mL OMNIPAQUE IOHEXOL 350 MG/ML SOLN COMPARISON:  CT perfusion from earlier the same day FINDINGS: CT Brain Perfusion Findings: CBF (<30%) Volume: 0mL Perfusion (Tmax>6.0s) volume: 36mL in the right PCA distribution primarily seen at the occipital lobe. IMPRESSION: Increased ischemic range perfusion in the right PCA distribution measuring 36 cc as compared to 25 cc earlier today. Electronically Signed   By: Tiburcio Pea M.D.   On: 02/14/2023 09:08   MR BRAIN WO CONTRAST Result Date: 02/14/2023 CLINICAL DATA:  Wake up stroke EXAM: MRI HEAD WITHOUT CONTRAST TECHNIQUE: Multiplanar, multiecho pulse sequences of the brain and surrounding structures were obtained without intravenous contrast. COMPARISON:  Head CTA from earlier today FINDINGS: Brain: Restricted diffusion in the right thalamus, right periatrial white matter, and medial temporal lobe at the hippocampus without any FLAIR changes. Case discussed while in progress with neurology. No acute infarct seen in a different vascular distribution. Known anterior left parafalcine meningioma with signal of dense cellularity, reference measurements  by CT given this noncontrast study. There is mass effect on the adjacent frontal lobe without edema. Mild chronic small vessel ischemia in the cerebral white matter. Small chronic right cerebellar infarct. No acute hemorrhage, hydrocephalus, or collection. Vascular: No detected change from prior. Skull and upper cervical spine: Normal marrow signal. Sinuses/Orbits: Unremarkable IMPRESSION: Diffusion hyperintensity in the right PCA distribution affecting the medial temporal lobe and thalamus. FLAIR hyperintensity is not yet detected. Chronic small vessel ischemia and left anterior parafalcine meningioma. Electronically Signed   By: Tiburcio Pea M.D.   On: 02/14/2023 05:43   CT ANGIO HEAD NECK W WO CM W PERF (CODE STROKE) Result Date: 02/14/2023 CLINICAL DATA:  Neuro deficit with acute stroke suspected. EXAM: CT ANGIOGRAPHY HEAD AND NECK CT PERFUSION BRAIN TECHNIQUE: Multidetector CT imaging of the head and neck was performed using the standard protocol during bolus administration of intravenous contrast. Multiplanar CT image reconstructions and MIPs were obtained to evaluate the vascular anatomy. Carotid stenosis measurements (when applicable) are obtained utilizing NASCET criteria, using the distal internal carotid diameter as the denominator. Multiphase CT imaging of the brain was performed following IV bolus contrast injection. Subsequent parametric perfusion maps were calculated using RAPID software. RADIATION DOSE REDUCTION: This exam was performed according to the departmental dose-optimization program which includes automated exposure control, adjustment of the mA and/or kV according to patient size and/or use of iterative reconstruction technique. CONTRAST:  Dose is not known on this in progress study. COMPARISON:  Head  CT from earlier today FINDINGS: Aortic arch: Atheromatous plaque with 3 vessel branching. Right carotid system: Calcified plaque in the proximal ICA without significant stenosis,  ulceration, or beading. Left carotid system: Mild atheromatous plaque for age mainly at the proximal ICA without stenosis or ulceration. Vertebral arteries: Proximal subclavian arteries. Calcified plaque at both vertebral ostia. No flow reducing stenosis, ulceration, or dissection. Skeleton: Generalized cervical spine degeneration with mild scoliosis. Other neck: No acute finding Upper chest: Emphysema and airway thickening. No acute finding. Review of the MIP images confirms the above findings CTA HEAD FINDINGS Limited by venous contamination. Anterior circulation: No emergent branch occlusion, segmental narrowing, aneurysm, or vascular malformation. There is diffuse atheromatous irregularity affecting the MCA and ACA distributions without correctable and proximal flow reducing stenosis. Posterior circulation: The vertebral and basilar arteries are atheromatous and tortuous without occlusion or flow reducing stenosis. Abrupt cut off at the right P2 segment with some downstream reconstitution. Venous sinuses: Unremarkable Anatomic variants: None significant CT Brain Perfusion Findings: ASPECTS: 10 CBF (<30%) Volume: 0mL Perfusion (Tmax>6.0s) volume: 55mL-in the right PCA distribution Findings discussed with neurology before signing. IMPRESSION: 1. Right P2 occlusion, presumably acute in this setting. There is 25 cc of relative ischemia by CT perfusion in this distribution. 2. Extensive intracranial atherosclerosis. 3. Atherosclerosis in the neck without flow reducing stenosis or suspicious embolic source. Electronically Signed   By: Tiburcio Pea M.D.   On: 02/14/2023 04:43   CT HEAD CODE STROKE WO CONTRAST Result Date: 02/14/2023 CLINICAL DATA:  Code stroke. EXAM: CT HEAD WITHOUT CONTRAST TECHNIQUE: Contiguous axial images were obtained from the base of the skull through the vertex without intravenous contrast. RADIATION DOSE REDUCTION: This exam was performed according to the departmental dose-optimization  program which includes automated exposure control, adjustment of the mA and/or kV according to patient size and/or use of iterative reconstruction technique. COMPARISON:  03/29/2013 FINDINGS: Brain: No evidence of acute infarction, hemorrhage, hydrocephalus, extra-axial collection. Partially calcified anterior left parafalcine mass consistent with meningioma, 3 cm in maximal size with mass effect on the local frontal lobe. No evidence of adjacent brain edema. Vascular: No hyperdense vessel or unexpected calcification. Skull: Normal. Negative for fracture or focal lesion. Sinuses/Orbits: No acute finding. Other: Prelim sent in epic chat. ASPECTS [facility] Stroke Program Early CT Score) Ten on both sides. IMPRESSION: 1. No acute finding. 2. 3 cm anterior left parafalcine meningioma that has enlarged from 2015. Electronically Signed   By: Tiburcio Pea M.D.   On: 02/14/2023 04:19    Vitals:   02/14/23 1345 02/14/23 1355 02/14/23 1400 02/14/23 1415  BP: (!) 146/72 136/71 137/66 129/76  Pulse: 72 73 73 83  Resp: 19 19 (!) 21 18  Temp:      TempSrc:      SpO2: 95% 95% 98% (!) 89%  Weight:      Height:         PHYSICAL EXAM General:  Alert, well-nourished, well-developed patient in no acute distress Psych:  Mood and affect appropriate for situation CV: Regular rate and rhythm on monitor Respiratory:  Regular, unlabored respirations on room air GI: Abdomen soft and nontender   NEURO:  Mental Status: Drowsy, AOx3, patient is able to give clear and coherent history Speech/Language: Dysarthria present, no aphasia. Naming, repetition, fluency, and comprehension intact.  Cranial Nerves:  II: PERRL. ?left hemianopia, inconsistent responses in visual field testing. Does report diplopia at baseline.  III, IV, VI: EOMI. Eyelids elevate symmetrically.  V: Sensation is intact to light  touch and symmetrical to face.  VII: Face is symmetrical resting and smiling VIII: hearing intact to voice. IX, X:  Palate elevates symmetrically. Phonation is normal.  LO:VFIEPPIR shrug 5/5. XII: tongue is midline without fasciculations. Motor:  Left hemiplegia present.  LUE: 3/5 with mild drift, antigravity strength only. 4/5 grip LLE: 3/5 with moderate drift, slightly weaker than arm. Stronger distally.  RUE/RLE: 5/5, no drift.  Tone: is normal and bulk is normal Sensation- Decreased in left arm and leg.  Coordination: FTN intact bilaterally, HKS: no ataxia in BLE.No drift.  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 Cardioembolic source Code Stroke CT head - No acute abnormality.  3 cm anterior left parafalcine meningioma, enlarged from 2015. CTA head & neck w/Perfusion Right P2 occlusion. Extensive intracranial atherosclerosis at bilateral ICA bulbs and siphons. CTP 0/25cc  MRI Diffusion hyperintensity in the right PCA distribution affecting the medial temporal lobe and thalamus. FLAIR hyperintensity is not yet detected. left anterior parafalcine meningioma. NIHSS 11->3->7 Repeat CTH Subtle but likely cytotoxic edema now seen at areas of right temporal and thalamic diffusion abnormality Repeat CT Perfusion 0/36 S/p IR with right distal P2 occlusion and TICI3 reperfusion MRI and MRA pending 2D Echo: PENDING LDL pending HgbA1c pending UDS negative VTE prophylaxis -SCDs within 24h of TNK aspirin 325 mg daily prior to admission, now on No antithrombotic pending 24-hour post TNK imaging. Therapy recommendations:  Pending Disposition:  pending  History of stroke 03/2013 right PICA small infarct, EF 35 to 40%, LDL 112.  Discharged on aspirin 81 and statin.  CHF 03/2013 2D echo EF 35 to 40% Home medication including metoprolol, spironolactone 2D echo this admission pending Will consider cardiology consult if low EF  Hypertension Home meds:   amlodipine 10mg , zestoretic 20-25mg , toprol-xl 50mg  Cleviprex gtt, wean as tolerated Labetalol IV PRN Blood Pressure Goal: SBP 120-160 for first 24 hours then less than 180   Hyperlipidemia Home meds: Lipitor 80 LDL pending, goal < 70 Consider statin once p.o. access  Tobacco Abuse Former Cigarette Smoker  Dysphagia Currently n.p.o. Speech on board On IV fluid  Other Stroke Risk Factors Obesity, Body mass index is 32.95 kg/m., BMI >/= 30 associated with increased stroke risk, recommend weight loss, diet and exercise as appropriate  Former smoker  Other acute issues Doubt Stable left frontal meningioma AKI, creatinine 1.20--1.30, on IV fluid  Hospital day # 0   Pt seen by Neuro NP/APP and later by MD. Note/plan to be edited by MD as needed.    Lynnae January, DNP, AGACNP-BC [facility] Please use AMION for contact information & EPIC for messaging.  ATTENDING NOTE: I reviewed above note and agree with the assessment and plan. Pt was seen and examined.   Pt was seen in room with wife. Pt was lethargic, but open eyes on voice, AAO x 3, no aphasia, but mild dysarthria. Following all simple commands, but paucity of speech but able to name and repeat simple sentences in dysarthric voice.  Visual field full, no gaze palsy, left facial droop, left upper extremity drift to bed within 10 seconds, left lower extremity drift to bed beyond 5 seconds.  Decree sensation on left lower extremity but not on left upper extremity or left face.  Right finger-to-nose intact.  No sensory or visual neglect.  NIHSS = 7. Pt went  to IR with right P2 TICI3 reperfusion.  Post IR CT no hemorrhage.  Patient was intubated after procedure.  Currently holding off antiplatelet given within 24 hours of TNK.  Pending 2D echo to evaluate CHF due to history.  Currently n.p.o., holding off statin, speech on board, on IV fluid.  BP goal 120-160 post IR for 24 hours.  PT and OT pending.  I had long  discussion with wife at bedside, updated pt current condition, treatment plan and potential prognosis, and answered all the questions.  She expressed understanding and appreciation.    For detailed assessment and plan, please refer to above/below as I have made changes wherever appropriate.   Marvel Plan, MD PhD Stroke Neurology 02/14/2023 4:51 PM  This patient is critically ill due to right PCA stroke, right P2 occlusion status post thrombectomy, CHF and at significant risk of neurological worsening, death form recurrent stroke, hemorrhagic transmission, heart failure. This patient's care requires constant monitoring of vital signs, hemodynamics, respiratory and cardiac monitoring, review of multiple databases, neurological assessment, discussion with family, other specialists and medical decision making of high complexity. I spent 30 minutes of neurocritical care time in the care of this patient.     To contact Stroke Continuity provider, please refer to [email or web address]. After hours, contact General Neurology

## 2023-02-14 NOTE — Anesthesia Postprocedure Evaluation (Signed)
Anesthesia Post Note  Patient: Edward Rodgers  Procedure(s) Performed: IR WITH ANESTHESIA     Patient location during evaluation: PACU Anesthesia Type: General Level of consciousness: awake and alert Pain management: pain level controlled Vital Signs Assessment: post-procedure vital signs reviewed and stable Respiratory status: spontaneous breathing, nonlabored ventilation, respiratory function stable and patient connected to nasal cannula oxygen Cardiovascular status: blood pressure returned to baseline and stable Postop Assessment: no apparent nausea or vomiting Anesthetic complications: no   There were no known notable events for this encounter.  Last Vitals:  Vitals:   02/14/23 1400 02/14/23 1415  BP: 137/66 129/76  Pulse: 73 83  Resp: (!) 21 18  Temp:    SpO2: 98% (!) 89%    Last Pain:  Vitals:   02/14/23 1230  TempSrc: Axillary  PainSc: 0-No pain                 Earl Lites P Pocahontas Cohenour

## 2023-02-14 NOTE — Procedures (Signed)
INR  Status post a left vertebral arteriogram.  A right CFA approach.  Findings.  1.  Occluded right PCA distal P2 segment.  Status post complete revascularization of the occluded  right posterior cerebral artery distal P2 segment with 1 pass with an 043 distal aspiration and 062 aspiration combination, achieving a TICI 3 revascularization.  Post CT brain no evidence of intracranial hemorrhage.  8 French Angio-Seal closure device deployed at the right groin puncture site.  Distal pulses all present bilaterally unchanged from prior to procedure.  Patient extubated.  Opens eyes to name .  Not following simple commands.  No  movement in the left arm and leg unchanged from prior to the procedure.  S.Caden Fukushima MD

## 2023-02-14 NOTE — ED Notes (Signed)
Delay encountered while at CT scan due to infiltrated EMS IV .

## 2023-02-14 NOTE — Progress Notes (Signed)
OT Cancellation Note  Patient Details Name: Edward Rodgers MRN: [id number] DOB: 1950/05/13   Cancelled Treatment:    Reason Eval/Treat Not Completed: Medical issues which prohibited therapy (Pt rushed back to IR. Will follow up as medically appropriate.)  Tyler Deis, OTR/L [facility] Acute Rehabilitation Office: [phone]   Myrla Halsted 02/14/2023, 11:25 AM

## 2023-02-14 NOTE — ED Triage Notes (Signed)
Patient arrived with EMS from home , LKW 10 pm 02/13/23 , woke up at 1 am this morning with left sided weakness/left facial droop . Evaluated by EDP at arrival and cleared for CT scan .

## 2023-02-14 NOTE — Progress Notes (Signed)
PHARMACIST CODE STROKE RESPONSE  Notified to mix TNK at 0533 by Dr. Derry Lory TNK preparation completed at 0536  TNK dose = 25 mg IV over 5 seconds  Issues/delays encountered (if applicable): Pt in MRI when decision made and needed to be taken out, BP machine in MRI was not working so needed to wait for another monitor to check BP, DBP >110 so gave labetalol 10mg , BP at goal on recheck.  Vernard Gambles, PharmD, BCPS  02/14/23 5:46 AM

## 2023-02-14 NOTE — H&P (Signed)
NEUROLOGY CONSULT NOTE   Date of service: February 14, 2023 Patient Name: [name] MRN:  [id number] DOB:  03-04-1950 Chief Complaint: "L sided weakness, numbness and dysconjugate gaze" Requesting Provider: Gilda Crease, *  History of Present Illness  Edward Rodgers is a 73 y.o. male with hx of gout, HTN, Hypothyroidism, prior cerebellar stroke, who presents with left sided weakness, numbness, dysconjugate gaze, slight R gaze preference and inconsistent responses to visual field testing on the left.  He walked to bed at 2200 on 02/13/23. Woke up in the middle of the night, does not recall the time but per EMS, roughly 0100 AM on 02/14/23 and reports he was unable to feel anything. I spoke with his wife Edward Rodgers who was able to look up on her phone and reported LKW of around 0115.  He presented with left sided weakness, numbness and dysconjugate gaze and slight R gaze preference and inconsistent responses in L hemivisual field but not a full hemianopsia and was brought in to the ED as a code stroke.  CT Head with ASPECT of 10, CTA with R PCA P2 occlusion. CTP with 25cc penumbra in the R occipital lobe. I discussed thrombectomy with his son and wife at the bedside and with Dr. Kerby Nora who also discussed this with son and wife. Wife and son consented to thrombectomy. However, he improved while wheeling him back to IR suite and upon repeat evaluation, had no visual field deficit, no numbness and some movement in his L arm and L leg strength. NIHSS down to a 3 from 11. Given spontaneous improvement, discussed with Dr. Corliss Skains, and we both felt that the deficit at this time is too mild to consider thrombectomy but appropriate to consider tnkase. We therefore cancelled plan for thrombectomy. He was taken to STAT MRI to evaluate for DWI/FLAIR mismatch. MRI brain with diffusion restriction in the R medial temporal and R thalamus with no signal change on FLAIR. NIHSS fluctuating between 3-5  with fluctuating L arm and leg weakness. He was offered tnkase and both patient and wife consented to tnkase administration.  LKW: 2200, when he went to bed Modified rankin score: 0-Completely asymptomatic and back to baseline post- stroke IV Thrombolysis: Yes offered. There was significant delay given initial plan for thrombectomy but with improving exam, felt residual symptoms were too mild for thrombectomy. Since he woke up around 0115 with symptoms. STAT MRI Brain had to be obtained which demonstrated clear DWI/FLAIR mismatch and so was offered tnkase. Patient and wife opted to get tnkase. His blood pressure had to be lowered prior to tnkase administration which led to further delay. There was also some delay as I was at [facility] seeing another patient when this was activated as a code stroke. I had to drive back and I evaluated patient at 0413AM. EVT: initially offered and patient, wife and son consented to thrombectomy. However, given spontaneous improvement, felt symptoms were now too mild to consider thrombectomy and thus thrombectomy was cancelled after I discussed with Dr. Kerby Nora.  NIHSS components Score: Comment  1a Level of Conscious 0[x]  1[]  2[]  3[]      1b LOC Questions 0[x]  1[]  2[]       1c LOC Commands 0[x]  1[]  2[]       2 Best Gaze 0[]  1[x]  2[]       3 Visual 0[]  1[x]  2[]  3[]      4 Facial Palsy 0[x]  1[]  2[]  3[]      5a Motor Arm -  left 0[]  1[]  2[]  3[]  4[x]  UN[]    5b Motor Arm - Right 0[x]  1[]  2[]  3[]  4[]  UN[]    6a Motor Leg - Left 0[]  1[]  2[]  3[]  4[x]  UN[]    6b Motor Leg - Right 0[x]  1[]  2[]  3[]  4[]  UN[]    7 Limb Ataxia 0[x]  1[]  2[]  3[]  UN[]     8 Sensory 0[]  1[x]  2[]  UN[]      9 Best Language 0[x]  1[]  2[]  3[]      10 Dysarthria 0[x]  1[]  2[]  UN[]      11 Extinct. and Inattention 0[x]  1[]  2[]       TOTAL: 11    NIHSS enroute to IR suite: NIHSS components Score: Comment  1a Level of Conscious 0[x]  1[]  2[]  3[]      1b LOC Questions 0[x]  1[]  2[]       1c LOC  Commands 0[x]  1[]  2[]       2 Best Gaze 0[x]  1[]  2[]       3 Visual 0[x]  1[]  2[]  3[]      4 Facial Palsy 0[x]  1[]  2[]  3[]      5a Motor Arm - left 0[]  1[]  2[x]  3[]  4[]  UN[]    5b Motor Arm - Right 0[x]  1[]  2[]  3[]  4[]  UN[]    6a Motor Leg - Left 0[]  1[x]  2[]  3[]  4[]  UN[]    6b Motor Leg - Right 0[x]  1[]  2[]  3[]  4[]  UN[]    7 Limb Ataxia 0[x]  1[]  2[]  3[]  UN[]     8 Sensory 0[x]  1[]  2[]  UN[]      9 Best Language 0[x]  1[]  2[]  3[]      10 Dysarthria 0[x]  1[]  2[]  UN[]      11 Extinct. and Inattention 0[x]  1[]  2[]       TOTAL: 3       ROS  Comprehensive ROS performed and pertinent positives documented in HPI   Past History   Past Medical History:  Diagnosis Date   Cancer (HCC) 2002   prostate   CHF (congestive heart failure) (HCC)    Gout    Hypertension    Thyroid disease     Past Surgical History:  Procedure Laterality Date   COLONOSCOPY N/A 07/15/2017   Procedure: COLONOSCOPY;  Surgeon: Malissa Hippo, MD;  Location: [facility];  Service: Endoscopy;  Laterality: N/A;  830   POLYPECTOMY  07/15/2017   Procedure: POLYPECTOMY;  Surgeon: Malissa Hippo, MD;  Location: [facility];  Service: Endoscopy;;  colon   PROSTATE SURGERY      Family History: No family history on file.  Social History  reports that he has quit smoking. He has never used smokeless tobacco. He reports that he does not drink alcohol and does not use drugs.  No Known Allergies  Medications  No current facility-administered medications for this encounter.  Current Outpatient Medications:    alendronate (FOSAMAX) 70 MG tablet, Take 70 mg by mouth once a week. Take with a full glass of water on an empty stomach., Disp: , Rfl:    allopurinol (ZYLOPRIM) 300 MG tablet, Take 300 mg by mouth every morning., Disp: , Rfl:    amLODipine (NORVASC) 10 MG tablet, Take 10 mg by mouth every morning., Disp: , Rfl:    aspirin 325 MG tablet, Take 1 tablet (325 mg total) by mouth daily., Disp: 30 tablet, Rfl: 5   Calcium  Carb-Cholecalciferol (CALCIUM 600 + D PO), Take 1 tablet by mouth daily., Disp: , Rfl:    ibuprofen (ADVIL) 800 MG tablet, Take 800 mg by mouth every 8 (eight) hours as  needed., Disp: , Rfl:    latanoprost (XALATAN) 0.005 % ophthalmic solution, Place 1 drop into the right eye at bedtime., Disp: , Rfl:    levothyroxine (SYNTHROID, LEVOTHROID) 50 MCG tablet, Take 50 mcg by mouth daily before breakfast., Disp: , Rfl:    lisinopril-hydrochlorothiazide (ZESTORETIC) 20-25 MG tablet, Take 1 tablet by mouth daily., Disp: , Rfl:    meloxicam (MOBIC) 15 MG tablet, Take 1 tablet (15 mg total) by mouth daily., Disp: 6 tablet, Rfl: 0   metoprolol succinate (TOPROL-XL) 50 MG 24 hr tablet, Take 50 mg by mouth daily. Take with or immediately following a meal., Disp: , Rfl:    mirabegron ER (MYRBETRIQ) 50 MG TB24 tablet, Take 50 mg by mouth daily., Disp: , Rfl:    sacubitril-valsartan (ENTRESTO) 97-103 MG, Take 1 tablet by mouth 2 (two) times daily., Disp: , Rfl:    spironolactone (ALDACTONE) 25 MG tablet, Take 25 mg by mouth daily., Disp: , Rfl:   Vitals   Vitals:   02/14/23 0400 02/14/23 0407  BP:  (!) 180/108  Pulse:  65  Resp:  19  Temp:  (!) 96.9 F (36.1 C)  TempSrc:  Temporal  SpO2:  99%  Weight: 101.2 kg   Height: 5\' 9"  (1.753 m)     Body mass index is 32.95 kg/m.  Physical Exam   General: Laying comfortably in bed; in no acute distress.  HENT: Normal oropharynx and mucosa. Normal external appearance of ears and nose.  Neck: Supple, no pain or tenderness  CV: No JVD. No peripheral edema.  Pulmonary: Symmetric Chest rise. Normal respiratory effort.  Abdomen: Soft to touch, non-tender.  Ext: No cyanosis, edema, or deformity  Skin: No rash. Normal palpation of skin.   Musculoskeletal: Normal digits and nails by inspection. No clubbing.   Neurologic Examination  Mental status/Cognition: Alert, oriented to self, place, month and year, good attention.  Speech/language: Fluent,  comprehension intact, object naming intact, repetition intact.  Cranial nerves:   CN II Pupils equal and reactive to light, inconsistent responses to visual field testing in L hemivisual field initially   CN III,IV,VI EOMI intact, slight R gaze preference and dysconjugate gaze.   CN V normal sensation in V1, V2, and V3 segments bilaterally   CN VII no asymmetry, no nasolabial fold flattening   CN VIII normal hearing to speech   CN IX & X normal palatal elevation, no uvular deviation   CN XI 5/5 head turn and 5/5 shoulder shrug bilaterally   CN XII midline tongue protrusion   Motor:  Muscle bulk: normal, tone flaccid in LUE and LLE. Mvmt Root Nerve  Muscle Right Left Comments  SA C5/6 Ax Deltoid 5 0   EF C5/6 Mc Biceps 5 0   EE C6/7/8 Rad Triceps 5 0   WF C6/7 Med FCR     WE C7/8 PIN ECU     F Ab C8/T1 U ADM/FDI 5 0   HF L1/2/3 Fem Illopsoas 5 0   KE L2/3/4 Fem Quad 5 0   DF L4/5 D Peron Tib Ant 5 0   PF S1/2 Tibial Grc/Sol 5 0    Sensation:  Light touch Decreased in LUE but intact in all other extremities.   Pin prick    Temperature    Vibration   Proprioception    Coordination/Complex Motor:  - Finger to Nose intact on the right - Heel to shin unable to do - Gait: deferred.   Labs/Imaging/Neurodiagnostic studies   CBC:  Recent Labs  Lab 02/14/23 0406 02/14/23 0407  WBC 5.3  --   NEUTROABS 3.2  --   HGB 13.3 15.0  HCT 41.6 44.0  MCV 88.5  --   PLT 177  --    Basic Metabolic Panel:  Lab Results  Component Value Date   NA 139 02/14/2023   K 4.3 02/14/2023   CO2 23 08/23/2022   GLUCOSE 114 (H) 02/14/2023   BUN 34 (H) 02/14/2023   CREATININE 1.30 (H) 02/14/2023   CALCIUM 9.4 08/23/2022   GFRNONAA 54 (L) 08/23/2022   GFRAA >90 03/29/2013   Lipid Panel:  Lab Results  Component Value Date   LDLCALC 112 (H) 03/30/2013   HgbA1c:  Lab Results  Component Value Date   HGBA1C 5.8 (H) 03/29/2013   Urine Drug Screen: No results found for: "LABOPIA",  "COCAINSCRNUR", "LABBENZ", "AMPHETMU", "THCU", "LABBARB"  Alcohol Level No results found for: "ETH" INR No results found for: "INR" APTT No results found for: "APTT" AED levels: No results found for: "PHENYTOIN", "ZONISAMIDE", "LAMOTRIGINE", "LEVETIRACETA"  CT Head without contrast(Personally reviewed): CTH was negative for a large hypodensity concerning for a large territory infarct or hyperdensity concerning for an ICH  CT angio Head and Neck with contrast(Personally reviewed): R PCA Proximal P2 occlusion.  MRI Brain(Personally reviewed): Diffusion restriction in the R medial temporal and R thalamus with no signal change on FLAIR.  ASSESSMENT   DANDRAE KUSTRA is a 73 y.o. male with hx of gout, HTN, Hypothyroidism, prior cerebellar stroke, who presents with left sided weakness, numbness, dysconjugate gaze, slight R gaze preference and inconsistent responses to visual field testing on the left. NIHSS of 11 and he was found to have R PCA P2 occlusion. Was initially offered thrombectomy. However, he improved spontaneously just prior to thrombectomy with NIHSS down to a 3. Symptoms now felt too mild for thrombectomy but still felt benefit from tnkase would outweigh risks. Was taken for STAT MRI Brain which demonstrated diffusion restriction in the R medial temporal and R thalamus with no signal change on FLAIR. Noted to have fluctuating motor weakness and NIHSS varies between 3-5. Given he had woken up with his symptoms around 0115, and noted DWI/FLAIR mismatch, he was offered tnkase right at the end of 4.5 hours window from waking up. Him and his wife agreeable to tnkase. He was given tnkase at 0545 at 4.5 hours from waking up.  Will admit him to Neuro ICU for post tnkase checks.  I reviewed MRI brain with Dr. Kerby Nora and felt that the weakness localizes to the noted R thalamic stroke which seems more consistent with a perforator infarct and felt unlikely to benefit from  thrombectomy.  RECOMMENDATIONS  Acute R PCA territory stroke s/p tnkase: - Frequent NeuroChecks for post tNK care per stroke unit protocol: - Initial CTH demonstrated no acute hemorrhage or mass - MRI Brain - DWI/FLAIR mismatch in R medial temporal lobe, R thalamus. - CTA - R PCA P2 occlusion - CT Perfusion - 25cc penumbra in R PCA distribution - TTE - pending - Lipid Panel: LDL - pending  - Statin: if LDL > 70 - HbA1c: pending. - Antithrombotic: Start ASA 81 mg daily if 24 h CTH does not show acute hemorrhage - DVT prophylaxis: SCDs. Pharmacologic prophylaxis if 24 h CTH does not demonstrate acute hemorrhage - Systolic Blood Pressure goal: < 180 mm Hg - Telemetry monitoring for arrhythmia: 72 hours - Swallow screen - ordered - PT/OT/SLP consults   HTN: Hold  home meds. Will use PRN Labetalol to keep SBP less than 180. However, can add Cleviprex if needed  Hypothyroidism: Cont home synthroid.  Gout: Hold meloxicam, ibuprofen, allopurinol ______________________________________________________________________   CODE STATUS discussed with patient and wife and patient is full code.  Allergies verified and patient has no known allergies.  This patient is critically ill and at significant risk of neurological worsening, death and care requires constant monitoring of vital signs, hemodynamics,respiratory and cardiac monitoring, neurological assessment, discussion with family, other specialists and medical decision making of high complexity. I spent 105 minutes of neurocritical care time  in the care of  this patient. This was time spent independent of any time provided by nurse practitioner or PA.  Erick Blinks [facility] 02/14/2023  6:36 AM   Signed, Erick Blinks, MD [facility]

## 2023-02-14 NOTE — Plan of Care (Addendum)
Patient admitted s/p Left sided weakness at home. Upon initial morning assessment patient was found to have change in NIH score. Dr. Roda Shutters and team informed, stat CT performed. Dr. Roda Shutters at bedside to assess patient. Patient taken to IR for thrombectomy shortly after. Patient and family express understanding. Neuro assessments performed as ordered. PRN Tylenol given once for pain, patient expressed some relief. Patient and family updated in plan of care. ICU status maintained.   Problem: Education: Goal: Knowledge of disease or condition will improve Outcome: Progressing Goal: Knowledge of secondary prevention will improve (MUST DOCUMENT ALL) Outcome: Progressing Goal: Knowledge of patient specific risk factors will improve (DELETE if not current risk factor) Outcome: Progressing   Problem: Ischemic Stroke/TIA Tissue Perfusion: Goal: Complications of ischemic stroke/TIA will be minimized Outcome: Progressing   Problem: Coping: Goal: Will verbalize positive feelings about self Outcome: Progressing Goal: Will identify appropriate support needs Outcome: Progressing   Problem: Health Behavior/Discharge Planning: Goal: Ability to manage health-related needs will improve Outcome: Progressing Goal: Goals will be collaboratively established with patient/family Outcome: Progressing   Problem: Self-Care: Goal: Ability to participate in self-care as condition permits will improve Outcome: Progressing Goal: Verbalization of feelings and concerns over difficulty with self-care will improve Outcome: Progressing Goal: Ability to communicate needs accurately will improve Outcome: Progressing   Problem: Nutrition: Goal: Risk of aspiration will decrease Outcome: Progressing Goal: Dietary intake will improve Outcome: Progressing   Problem: Education: Goal: Knowledge of General Education information will improve Description: Including pain rating scale, medication(s)/side effects and  non-pharmacologic comfort measures Outcome: Progressing   Problem: Health Behavior/Discharge Planning: Goal: Ability to manage health-related needs will improve Outcome: Progressing   Problem: Clinical Measurements: Goal: Ability to maintain clinical measurements within normal limits will improve Outcome: Progressing Goal: Will remain free from infection Outcome: Progressing Goal: Diagnostic test results will improve Outcome: Progressing Goal: Respiratory complications will improve Outcome: Progressing Goal: Cardiovascular complication will be avoided Outcome: Progressing   Problem: Activity: Goal: Risk for activity intolerance will decrease Outcome: Progressing   Problem: Nutrition: Goal: Adequate nutrition will be maintained Outcome: Progressing   Problem: Coping: Goal: Level of anxiety will decrease Outcome: Progressing   Problem: Elimination: Goal: Will not experience complications related to bowel motility Outcome: Progressing Goal: Will not experience complications related to urinary retention Outcome: Progressing   Problem: Pain Managment: Goal: General experience of comfort will improve and/or be controlled Outcome: Progressing   Problem: Safety: Goal: Ability to remain free from injury will improve Outcome: Progressing   Problem: Skin Integrity: Goal: Risk for impaired skin integrity will decrease Outcome: Progressing   Problem: Education: Goal: Understanding of CV disease, CV risk reduction, and recovery process will improve Outcome: Progressing Goal: Individualized Educational Video(s) Outcome: Progressing   Problem: Activity: Goal: Ability to return to baseline activity level will improve Outcome: Progressing   Problem: Cardiovascular: Goal: Ability to achieve and maintain adequate cardiovascular perfusion will improve Outcome: Progressing Goal: Vascular access site(s) Level 0-1 will be maintained Outcome: Progressing   Problem: Health  Behavior/Discharge Planning: Goal: Ability to safely manage health-related needs after discharge will improve Outcome: Progressing

## 2023-02-14 NOTE — Anesthesia Preprocedure Evaluation (Signed)
Anesthesia Evaluation  Patient identified by MRN, date of birth, ID band Patient confused    Reviewed: Patient's Chart, lab work & pertinent test resultsPreop documentation limited or incomplete due to emergent nature of procedure.  Airway Mallampati: II  TM Distance: >3 FB Neck ROM: Full    Dental no notable dental hx.    Pulmonary neg pulmonary ROS, former smoker   Pulmonary exam normal        Cardiovascular hypertension, Pt. on medications and Pt. on home beta blockers +CHF   Rhythm:Regular Rate:Normal     Neuro/Psych CODE STROKE  CVA  negative psych ROS   GI/Hepatic negative GI ROS, Neg liver ROS,,,  Endo/Other  negative endocrine ROS    Renal/GU negative Renal ROS  negative genitourinary   Musculoskeletal negative musculoskeletal ROS (+)    Abdominal Normal abdominal exam  (+)   Peds  Hematology Lab Results      Component                Value               Date                      WBC                      5.3                 02/14/2023                HGB                      15.0                02/14/2023                HCT                      44.0                02/14/2023                MCV                      88.5                02/14/2023                PLT                      177                 02/14/2023              Anesthesia Other Findings   Reproductive/Obstetrics                             Anesthesia Physical Anesthesia Plan  ASA: 3 and emergent  Anesthesia Plan: General   Post-op Pain Management:    Induction: Intravenous and Rapid sequence  PONV Risk Score and Plan: 2 and Ondansetron, Dexamethasone and Treatment may vary due to age or medical condition  Airway Management Planned: Mask and Oral ETT  Additional Equipment: None  Intra-op Plan:   Post-operative Plan: Possible Post-op intubation/ventilation  Informed Consent:      Only emergency  history available  Plan Discussed  with:   Anesthesia Plan Comments:        Anesthesia Quick Evaluation

## 2023-02-14 NOTE — ED Notes (Signed)
Neurologist advised RN that IR procedure is discontinued , returned back to trauma B.

## 2023-02-14 NOTE — ED Notes (Signed)
 Patient transported to MRI

## 2023-02-14 NOTE — Transfer of Care (Signed)
Immediate Anesthesia Transfer of Care Note  Patient: Edward Rodgers  Procedure(s) Performed: IR WITH ANESTHESIA  Patient Location: PACU  Anesthesia Type:General  Level of Consciousness: awake, alert , oriented, and confused  Airway & Oxygen Therapy: Patient Spontanous Breathing and Patient connected to face mask oxygen  Post-op Assessment: Report given to RN and Post -op Vital signs reviewed and stable  Post vital signs: Reviewed and stable  Last Vitals:  Vitals Value Taken Time  BP 141/91 02/14/23 1145  Temp    Pulse 79 02/14/23 1155  Resp 18 02/14/23 1155  SpO2 93 % 02/14/23 1155  Vitals shown include unfiled device data.  Last Pain:  Vitals:   02/14/23 1135  TempSrc:   PainSc: 0-No pain         Complications: There were no known notable events for this encounter.

## 2023-02-14 NOTE — ED Notes (Addendum)
Returned from MRI , patient received Labetalol and TNK at MRI .   Labetalol 10 mg IV given at 0543. TNK 25 mg IV at 0545.  Neurologist changed LKW to 0115 02/14/23 .

## 2023-02-14 NOTE — Progress Notes (Signed)
PT Cancellation Note  Patient Details Name: DAYQUAN BUYS MRN: [id number] DOB: 02-08-1950   Cancelled Treatment:    Reason Eval/Treat Not Completed: Patient not medically ready;Medical issues which prohibited therapy.  Emergently rushed to IR this morning.  Will HOLD today and try tomorrow as appropriate.  02/14/2023  Jacinto Halim., PT Acute Rehabilitation Services [phone]  (office)   Eliseo Gum Clarise Chacko 02/14/2023, 2:38 PM

## 2023-02-14 NOTE — Anesthesia Preprocedure Evaluation (Signed)
Anesthesia Evaluation  Patient identified by MRN, date of birth, ID band  Reviewed: Allergy & Precautions, Patient's Chart, lab work & pertinent test results, Unable to perform ROS - Chart review onlyPreop documentation limited or incomplete due to emergent nature of procedure.  History of Anesthesia Complications Negative for: history of anesthetic complications  Airway        Dental   Pulmonary former smoker          Cardiovascular hypertension, +CHF    Left ventricle: The cavity size was normal. Wall thickness    was increased in a pattern of moderate LVH. Systolic    function was moderately reduced. The estimated ejection    fraction was in the range of 35% to 40%. Diffuse    hypokinesis. There is akinesis of the inferior myocardium.    Doppler parameters are consistent with abnormal left    ventricular relaxation (grade 1 diastolic dysfunction).  - Aortic valve: Mildly calcified annulus. Trileaflet; mildly    thickened leaflets. No significant regurgitation.  - Mitral valve: Calcified annulus. Trivial regurgitation.  - Left atrium: The atrium was at the upper limits of normal    in size.  - Right ventricle: The cavity size was mildly dilated.  - Right atrium: The atrium was mildly dilated. Central    venous pressure: 3mm Hg (est).  - Atrial septum: No defect or patent foramen ovale was    identified.  - Tricuspid valve: Trivial regurgitation.  - Pulmonary arteries: Systolic pressure could not be    accurately estimated.  - Pericardium, extracardiac: There was no pericardial    effusion.     Neuro/Psych CVA, Residual Symptoms    GI/Hepatic negative GI ROS, Neg liver ROS,,,  Endo/Other  Hypothyroidism    Renal/GU Lab Results      Component                Value               Date                      NA                       139                 02/14/2023                K                        4.3                  02/14/2023                CO2                      22                  02/14/2023                GLUCOSE                  114 (H)             02/14/2023                BUN  34 (H)              02/14/2023                CREATININE               1.30 (H)            02/14/2023                CALCIUM                  9.4                 02/14/2023                GFRNONAA                 >60                 02/14/2023                Musculoskeletal negative musculoskeletal ROS (+)    Abdominal   Peds  Hematology negative hematology ROS (+) Lab Results      Component                Value               Date                      WBC                      5.3                 02/14/2023                HGB                      15.0                02/14/2023                HCT                      44.0                02/14/2023                MCV                      88.5                02/14/2023                PLT                      177                 02/14/2023              Anesthesia Other Findings   Reproductive/Obstetrics                              Anesthesia Physical Anesthesia Plan  ASA: 4  Anesthesia Plan: General   Post-op Pain Management:    Induction: Intravenous, Rapid sequence and Cricoid pressure planned  PONV Risk Score and Plan: 2 and Ondansetron and Dexamethasone  Airway Management  Planned: Oral ETT  Additional Equipment: Arterial line  Intra-op Plan:   Post-operative Plan: Possible Post-op intubation/ventilation  Informed Consent:      History available from chart only and Only emergency history available  Plan Discussed with: CRNA  Anesthesia Plan Comments:          Anesthesia Quick Evaluation

## 2023-02-15 ENCOUNTER — Inpatient Hospital Stay (HOSPITAL_COMMUNITY): Payer: Medicare Other

## 2023-02-15 DIAGNOSIS — I6389 Other cerebral infarction: Secondary | ICD-10-CM | POA: Diagnosis not present

## 2023-02-15 DIAGNOSIS — I63531 Cerebral infarction due to unspecified occlusion or stenosis of right posterior cerebral artery: Secondary | ICD-10-CM | POA: Diagnosis not present

## 2023-02-15 LAB — ECHOCARDIOGRAM COMPLETE
AR max vel: 1.99 cm2
AV Area VTI: 1.87 cm2
AV Area mean vel: 1.86 cm2
AV Mean grad: 5 mm[Hg]
AV Peak grad: 8.5 mm[Hg]
Ao pk vel: 1.46 m/s
Area-P 1/2: 5.66 cm2
Est EF: 55
Height: 69 in
MV VTI: 3.22 cm2
S' Lateral: 4.1 cm
Single Plane A4C EF: 50.3 %
Weight: 3569.69 [oz_av]

## 2023-02-15 LAB — CBC
HCT: 39.5 % (ref 39.0–52.0)
Hemoglobin: 12.8 g/dL — ABNORMAL LOW (ref 13.0–17.0)
MCH: 28.4 pg (ref 26.0–34.0)
MCHC: 32.4 g/dL (ref 30.0–36.0)
MCV: 87.6 fL (ref 80.0–100.0)
Platelets: 188 10*3/uL (ref 150–400)
RBC: 4.51 MIL/uL (ref 4.22–5.81)
RDW: 14.4 % (ref 11.5–15.5)
WBC: 6.8 10*3/uL (ref 4.0–10.5)
nRBC: 0 % (ref 0.0–0.2)

## 2023-02-15 LAB — BASIC METABOLIC PANEL
Anion gap: 7 (ref 5–15)
BUN: 8 mg/dL (ref 8–23)
CO2: 25 mmol/L (ref 22–32)
Calcium: 9.3 mg/dL (ref 8.9–10.3)
Chloride: 104 mmol/L (ref 98–111)
Creatinine, Ser: 0.96 mg/dL (ref 0.61–1.24)
GFR, Estimated: >60 mL/min (ref >60)
Glucose, Bld: 118 mg/dL — ABNORMAL HIGH (ref 70–99)
Potassium: 3.2 mmol/L — ABNORMAL LOW (ref 3.5–5.1)
Sodium: 136 mmol/L (ref 135–145)

## 2023-02-15 LAB — LIPID PANEL
Cholesterol: 96 mg/dL (ref 0–200)
HDL: 37 mg/dL — ABNORMAL LOW (ref >40)
LDL Cholesterol: 30 mg/dL (ref 0–99)
Total CHOL/HDL Ratio: 2.6 {ratio}
Triglycerides: 146 mg/dL (ref <150)
VLDL: 29 mg/dL (ref 0–40)

## 2023-02-15 LAB — HEMOGLOBIN A1C
Hgb A1c MFr Bld: 6.4 % — ABNORMAL HIGH (ref 4.8–5.6)
Mean Plasma Glucose: 136.98 mg/dL

## 2023-02-15 MED ORDER — QUETIAPINE FUMARATE 25 MG PO TABS
25.0000 mg | ORAL_TABLET | Freq: Every day | ORAL | Status: DC
  Administered 2023-02-15: 25 mg via ORAL
  Filled 2023-02-15: qty 1

## 2023-02-15 MED ORDER — MIRABEGRON ER 25 MG PO TB24
50.0000 mg | ORAL_TABLET | Freq: Every day | ORAL | Status: DC
  Administered 2023-02-15 – 2023-02-21 (×7): 50 mg via ORAL
  Filled 2023-02-15: qty 1
  Filled 2023-02-15: qty 2
  Filled 2023-02-15: qty 1
  Filled 2023-02-15 (×4): qty 2

## 2023-02-15 MED ORDER — ASPIRIN 81 MG PO TBEC
81.0000 mg | DELAYED_RELEASE_TABLET | Freq: Every day | ORAL | Status: DC
Start: 2023-02-15 — End: 2023-02-21
  Administered 2023-02-15 – 2023-02-21 (×7): 81 mg via ORAL
  Filled 2023-02-15 (×7): qty 1

## 2023-02-15 MED ORDER — SPIRONOLACTONE 25 MG PO TABS
25.0000 mg | ORAL_TABLET | Freq: Every day | ORAL | Status: DC
  Administered 2023-02-15 – 2023-02-17 (×3): 25 mg via ORAL
  Filled 2023-02-15 (×3): qty 1

## 2023-02-15 MED ORDER — ALLOPURINOL 100 MG PO TABS
300.0000 mg | ORAL_TABLET | Freq: Every morning | ORAL | Status: DC
  Administered 2023-02-16 – 2023-02-21 (×6): 300 mg via ORAL
  Filled 2023-02-15: qty 3
  Filled 2023-02-15: qty 1
  Filled 2023-02-15 (×4): qty 3

## 2023-02-15 MED ORDER — PANTOPRAZOLE SODIUM 40 MG PO TBEC
40.0000 mg | DELAYED_RELEASE_TABLET | Freq: Every day | ORAL | Status: DC
Start: 2023-02-15 — End: 2023-02-16
  Administered 2023-02-15 – 2023-02-16 (×2): 40 mg via ORAL
  Filled 2023-02-15 (×2): qty 1

## 2023-02-15 MED ORDER — DIPHENHYDRAMINE HCL 50 MG/ML IJ SOLN
25.0000 mg | Freq: Once | INTRAMUSCULAR | Status: AC
  Administered 2023-02-15: 25 mg via INTRAVENOUS
  Filled 2023-02-15: qty 1

## 2023-02-15 MED ORDER — METOPROLOL SUCCINATE ER 100 MG PO TB24
100.0000 mg | ORAL_TABLET | Freq: Every day | ORAL | Status: DC
  Administered 2023-02-15 – 2023-02-21 (×7): 100 mg via ORAL
  Filled 2023-02-15: qty 2
  Filled 2023-02-15: qty 1
  Filled 2023-02-15: qty 2
  Filled 2023-02-15: qty 1
  Filled 2023-02-15: qty 2
  Filled 2023-02-15 (×3): qty 1

## 2023-02-15 MED ORDER — ATORVASTATIN CALCIUM 80 MG PO TABS
80.0000 mg | ORAL_TABLET | Freq: Every day | ORAL | Status: DC
  Administered 2023-02-15 – 2023-02-21 (×7): 80 mg via ORAL
  Filled 2023-02-15 (×7): qty 1

## 2023-02-15 MED ORDER — VENLAFAXINE HCL ER 75 MG PO CP24
150.0000 mg | ORAL_CAPSULE | Freq: Every evening | ORAL | Status: DC
  Administered 2023-02-15 – 2023-02-20 (×6): 150 mg via ORAL
  Filled 2023-02-15 (×5): qty 2
  Filled 2023-02-15 (×2): qty 1

## 2023-02-15 MED ORDER — LORAZEPAM 2 MG/ML IJ SOLN
1.0000 mg | Freq: Once | INTRAMUSCULAR | Status: AC
  Administered 2023-02-15: 1 mg via INTRAVENOUS
  Filled 2023-02-15: qty 1

## 2023-02-15 MED ORDER — PERFLUTREN LIPID MICROSPHERE
1.0000 mL | INTRAVENOUS | Status: DC | PRN
  Administered 2023-02-15: 2 mL via INTRAVENOUS

## 2023-02-15 NOTE — Plan of Care (Signed)
  Problem: Coping: Goal: Will verbalize positive feelings about self Outcome: Progressing Goal: Will identify appropriate support needs Outcome: Progressing   Problem: Health Behavior/Discharge Planning: Goal: Goals will be collaboratively established with patient/family Outcome: Progressing   Problem: Self-Care: Goal: Ability to communicate needs accurately will improve Outcome: Progressing   Problem: Nutrition: Goal: Risk of aspiration will decrease Outcome: Progressing Goal: Dietary intake will improve Outcome: Progressing   Problem: Clinical Measurements: Goal: Respiratory complications will improve Outcome: Progressing Goal: Cardiovascular complication will be avoided Outcome: Progressing   Problem: Coping: Goal: Level of anxiety will decrease Outcome: Progressing   Problem: Elimination: Goal: Will not experience complications related to urinary retention Outcome: Progressing

## 2023-02-15 NOTE — Evaluation (Signed)
Occupational Therapy Evaluation Patient Details Name: Edward Rodgers MRN: [id number] DOB: [birth year] Today's Date: 02/15/2023   History of Present Illness Patient is a 73 y/o male admitted 02/14/23 due to L side weakness, numbness and dysconjugate gaze, R gaze preference and inconsistent L visual field testing.  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 Intermittent improvements and worsening in NIHSS and noted increase in prenumbra to 36 so decided to take for thrombectomy R PCA distal P2 segment.  PMH positive for CHF, gout, HTN, thyroid disease, Prostate CA and prior CVA with residual diplopia.   Clinical Impression   PTA, pt lived with wife and was mod I for ADL. Upon eval, pt with L hemiparesis, decreased midline orientation, vision, L lower visual field deficit, safety, and activity tolerance. Pt currently requiring +2 max A for all mobility and LB ADL. L lateral lean sitting and standing fair receptiveness to feedback. Due to significant change in functional status recommending intensive multidisciplinary rehabilitation >3 hours/day to optimize safety and independence in ADL. '       If plan is discharge home, recommend the following: Two people to help with walking and/or transfers;Two people to help with bathing/dressing/bathroom;Assistance with cooking/housework;Assistance with feeding;Direct supervision/assist for medications management;Direct supervision/assist for financial management;Help with stairs or ramp for entrance;Assist for transportation    Functional Status Assessment  Patient has had a recent decline in their functional status and demonstrates the ability to make significant improvements in function in a reasonable and predictable amount of time.  Equipment Recommendations  Other (comment) (defer)    Recommendations for Other Services Rehab consult     Precautions / Restrictions Precautions Precautions: Fall Precaution Comments: pusher  L; glasses, some L lower field cut Restrictions Weight Bearing Restrictions Per Provider Order: No      Mobility Bed Mobility Overal bed mobility: Needs Assistance Bed Mobility: Sidelying to Sit, Rolling Rolling: Used rails, Mod assist Sidelying to sit: Used rails, Max assist, +2 for physical assistance       General bed mobility comments: assist for legs off EOB and to lift trunk upright; scoot hips to EOB    Transfers Overall transfer level: Needs assistance Equipment used: 2 person hand held assist Transfers: Sit to/from Stand, Bed to chair/wheelchair/BSC Sit to Stand: Max assist, +2 physical assistance Stand pivot transfers: Max assist, +2 physical assistance         General transfer comment: up to stand with heavy L lean initially, sat back and cues for head over R knee to stand with improved upright posture, then assist and cues for R lateral weight shift and A to move L foot then pt stepping over with R with support to L. Lowered to edge of chair and assist to scoot back      Balance Overall balance assessment: Needs assistance Sitting-balance support: Single extremity supported, Feet supported Sitting balance-Leahy Scale: Poor Sitting balance - Comments: sat with S to CGA with L hand holding footboard; assist with unsupported sitting due to L lateral lean Postural control: Left lateral lean Standing balance support: Bilateral upper extremity supported Standing balance-Leahy Scale: Zero Standing balance comment: +2 A for standing                           ADL either performed or assessed with clinical judgement   ADL Overall ADL's : Needs assistance/impaired Eating/Feeding: Moderate assistance;Sitting   Grooming: Sitting;Maximal assistance   Upper Body Bathing: Maximal  assistance;Sitting   Lower Body Bathing: Total assistance;+2 for physical assistance;+2 for safety/equipment;Sit to/from stand;Sitting/lateral leans   Upper Body Dressing : Maximal  assistance;Sitting   Lower Body Dressing: Total assistance;+2 for safety/equipment;+2 for physical assistance   Toilet Transfer: Maximal assistance;+2 for physical assistance;+2 for safety/equipment;Stand-pivot           Functional mobility during ADLs: Maximal assistance;+2 for physical assistance;+2 for safety/equipment (SPT only) General ADL Comments: limited by attention, vision, L hemi     Vision Baseline Vision/History: 1 Wears glasses Ability to See in Adequate Light: 0 Adequate Patient Visual Report: No change from baseline (pt seems unsure however) Vision Assessment?: Vision impaired- to be further tested in functional context;Yes Tracking/Visual Pursuits: Impaired - to be further tested in functional context (decresaed smoothness of tracking on R but pt reports this is baseline) Visual Fields: Left inferior homonymous quadranopsia Diplopia Assessment:  (only present when his glasses he wears at baseline (with prism) are not donned) Additional Comments: mod cues for all tracking including locating therapists when first entering room and when looking for items on tray table. Encouraged pt to practice scanning tray table until he could see the edge of the tray on his L     Perception Perception: Impaired Preception Impairment Details: Inattention/Neglect Perception-Other Comments: L inattention   Praxis         Pertinent Vitals/Pain Pain Assessment Pain Assessment: No/denies pain     Extremity/Trunk Assessment Upper Extremity Assessment Upper Extremity Assessment: Right hand dominant;LUE deficits/detail LUE Deficits / Details: Pt with 2/5 activation of traps, trace shoulder flexion against gravity, 2/5 elbow flexion, trace activation at hand LUE Coordination: decreased fine motor;decreased gross motor   Lower Extremity Assessment Lower Extremity Assessment: Defer to PT evaluation LLE Deficits / Details: unable to activate without assist; flexion with stretch to  extensors and pt able to extend the leg; no active ankle movement, noted some numbness compared to R LLE Sensation: decreased light touch;decreased proprioception LLE Coordination: decreased gross motor   Cervical / Trunk Assessment Cervical / Trunk Assessment: Kyphotic   Communication Communication Communication: No apparent difficulties   Cognition Arousal: Alert Behavior During Therapy: WFL for tasks assessed/performed Overall Cognitive Status: Impaired/Different from baseline Area of Impairment: Attention, Safety/judgement                   Current Attention Level: Sustained     Safety/Judgement: Decreased awareness of deficits, Decreased awareness of safety     General Comments: cues for upright posture, pushes L feeling he is falling R when he is falling L and noted decreased L side awareness     General Comments  BP 161/101 after up in chair, SpO2 > 90% on RA    Exercises     Shoulder Instructions      Home Living Family/patient expects to be discharged to:: Private residence Living Arrangements: Spouse/significant other Available Help at Discharge: Family;Available PRN/intermittently (wife works as Lawyer but per pt broither could come stay with him if needed during the day) Type of Home: House Home Access: Stairs to enter [employer] of Steps: 1   Home Layout: Two level;Able to live on main level with bedroom/bathroom;Laundry or work area in Artist of Steps: wood stove in basement   Bathroom Shower/Tub: Chief Strategy Officer: Standard     Home Equipment: Shower seat;Cane - quad;Hand held Public house manager      Lives With: Spouse    Prior Functioning/Environment Prior Level of Function :  Independent/Modified Independent             Mobility Comments: usses cane when he thinks about it ADLs Comments: Pt reports mod I for ADL and medication management.        OT Problem List: Decreased  strength;Decreased activity tolerance;Impaired balance (sitting and/or standing);Impaired vision/perception;Decreased range of motion;Decreased coordination;Decreased cognition;Decreased safety awareness;Decreased knowledge of use of DME or AE;Impaired sensation;Impaired UE functional use      OT Treatment/Interventions: Self-care/ADL training;Therapeutic exercise;DME and/or AE instruction;Neuromuscular education;Patient/family education;Balance training;Therapeutic activities;Cognitive remediation/compensation;Visual/perceptual remediation/compensation    OT Goals(Current goals can be found in the care plan section) Acute Rehab OT Goals Patient Stated Goal: get better OT Goal Formulation: With patient Time For Goal Achievement: 03/01/23 Potential to Achieve Goals: Good  OT Frequency: Min 1X/week    Co-evaluation PT/OT/SLP Co-Evaluation/Treatment: Yes Reason for Co-Treatment: Complexity of the patient's impairments (multi-system involvement);Necessary to address cognition/behavior during functional activity;For patient/therapist safety;To address functional/ADL transfers PT goals addressed during session: Mobility/safety with mobility;Balance OT goals addressed during session: ADL's and self-care;Strengthening/ROM      AM-PAC OT "6 Clicks" Daily Activity     Outcome Measure Help from another person eating meals?: A Lot Help from another person taking care of personal grooming?: A Lot Help from another person toileting, which includes using toliet, bedpan, or urinal?: Total Help from another person bathing (including washing, rinsing, drying)?: A Lot Help from another person to put on and taking off regular upper body clothing?: A Lot Help from another person to put on and taking off regular lower body clothing?: Total 6 Click Score: 10   End of Session Equipment Utilized During Treatment: Gait belt Nurse Communication: Mobility status  Activity Tolerance: Patient tolerated treatment  well Patient left: in chair;with call bell/phone within reach;with chair alarm set  OT Visit Diagnosis: Unsteadiness on feet (R26.81);Muscle weakness (generalized) (M62.81);Other symptoms and signs involving cognitive function;Low vision, both eyes (H54.2);Hemiplegia and hemiparesis Hemiplegia - Right/Left: Left Hemiplegia - dominant/non-dominant: Non-Dominant Hemiplegia - caused by: Cerebral infarction                Time: 9604-5409 OT Time Calculation (min): 29 min Charges:  OT General Charges $OT Visit: 1 Visit OT Evaluation $OT Eval Moderate Complexity: 1 Mod  Tyler Deis, OTR/L [facility] Acute Rehabilitation Office: [phone]   Myrla Halsted 02/15/2023, 4:57 PM

## 2023-02-15 NOTE — Progress Notes (Signed)
STROKE TEAM PROGRESS NOTE   INTERIM HISTORY/SUBJECTIVE Brother at bedside.  Patient lying bed, awake alert, orientated, no significant slurry speech, facial droop much improved.  Still has left hemiplegia and hemiparesthesia.  MRI done showed embolic pattern.  PT and OT recommend CIR.   OBJECTIVE  CBC    Component Value Date/Time   WBC 6.8 02/15/2023 0501   RBC 4.51 02/15/2023 0501   HGB 12.8 (L) 02/15/2023 0501   HCT 39.5 02/15/2023 0501   PLT 188 02/15/2023 0501   MCV 87.6 02/15/2023 0501   MCH 28.4 02/15/2023 0501   MCHC 32.4 02/15/2023 0501   RDW 14.4 02/15/2023 0501   LYMPHSABS 1.2 02/14/2023 0406   MONOABS 0.7 02/14/2023 0406   EOSABS 0.2 02/14/2023 0406   BASOSABS 0.0 02/14/2023 0406    BMET    Component Value Date/Time   NA 136 02/15/2023 0501   K 3.2 (L) 02/15/2023 0501   CL 104 02/15/2023 0501   CO2 25 02/15/2023 0501   GLUCOSE 118 (H) 02/15/2023 0501   BUN 8 02/15/2023 0501   CREATININE 0.96 02/15/2023 0501   CALCIUM 9.3 02/15/2023 0501   GFRNONAA >60 02/15/2023 0501    IMAGING past 24 hours ECHOCARDIOGRAM COMPLETE Result Date: 02/15/2023    ECHOCARDIOGRAM REPORT   Patient Name:   LENFORD BEDDOW Date of Exam: 02/15/2023 Medical Rec #:  [id number]      Height:       69.0 in Accession #:    [id number]     Weight:       223.1 lb Date of Birth:  1950-04-06      BSA:          2.164 m Patient Age:    73 years       BP:           120/71 mmHg Patient Gender: M              HR:           81 bpm. Exam Location:  Inpatient Procedure: 2D Echo, Cardiac Doppler, Color Doppler and Intracardiac            Opacification Agent Indications:    Stroke  History:        Patient has prior history of Echocardiogram examinations, most                 recent 03/30/2013. CHF; Stroke.  Sonographer:    Amy Chionchio Referring Phys: Erick Blinks IMPRESSIONS  1. No evidence of LV thrombus. Definity is administered. Left ventricular ejection fraction, by estimation, is 55%. The left ventricle  has normal function. The left ventricle has no regional wall motion abnormalities. Left ventricular diastolic parameters are consistent with Grade I diastolic dysfunction (impaired relaxation).  2. Right ventricular systolic function was not well visualized. The right ventricular size is not well visualized. Tricuspid regurgitation signal is inadequate for assessing PA pressure.  3. The mitral valve is normal in structure. No evidence of mitral valve regurgitation. No evidence of mitral stenosis.  4. The aortic valve was not well visualized. Aortic valve regurgitation is not visualized. No aortic stenosis is present.  5. Aortic dilatation noted. There is borderline dilatation of the aortic root, measuring 36 mm. There is mild dilatation of the ascending aorta, measuring 40 mm. Comparison(s): No prior Echocardiogram. FINDINGS  Left Ventricle: No evidence of LV thrombus. Definity is administered. Left ventricular ejection fraction, by estimation, is 55%. The left ventricle has normal function. The left ventricle  has no regional wall motion abnormalities. The left ventricular internal cavity size was normal in size. There is no left ventricular hypertrophy. Left ventricular diastolic parameters are consistent with Grade I diastolic dysfunction (impaired relaxation). Normal left ventricular filling pressure. Right Ventricle: The right ventricular size is not well visualized. Right vetricular wall thickness was not well visualized. Right ventricular systolic function was not well visualized. Tricuspid regurgitation signal is inadequate for assessing PA pressure. Left Atrium: Left atrial size was normal in size. Right Atrium: Right atrial size was normal in size. Pericardium: There is no evidence of pericardial effusion. Mitral Valve: The mitral valve is normal in structure. Mild mitral annular calcification. No evidence of mitral valve regurgitation. No evidence of mitral valve stenosis. MV peak gradient, 3.1 mmHg. The  mean mitral valve gradient is 1.0 mmHg. Tricuspid Valve: The tricuspid valve is not well visualized. Tricuspid valve regurgitation is not demonstrated. No evidence of tricuspid stenosis. Aortic Valve: The aortic valve was not well visualized. Aortic valve regurgitation is not visualized. No aortic stenosis is present. Aortic valve mean gradient measures 5.0 mmHg. Aortic valve peak gradient measures 8.5 mmHg. Aortic valve area, by VTI measures 1.87 cm. Pulmonic Valve: The pulmonic valve was not well visualized. Pulmonic valve regurgitation is not visualized. No evidence of pulmonic stenosis. Aorta: Aortic dilatation noted. There is borderline dilatation of the aortic root, measuring 36 mm. There is mild dilatation of the ascending aorta, measuring 40 mm. Venous: The inferior vena cava was not well visualized. IAS/Shunts: No atrial level shunt detected by color flow Doppler.  LEFT VENTRICLE PLAX 2D LVIDd:         5.10 cm      Diastology LVIDs:         4.10 cm      LV e' medial:    5.33 cm/s LV PW:         1.00 cm      LV E/e' medial:  6.7 LV IVS:        1.00 cm      LV e' lateral:   4.68 cm/s LVOT diam:     2.10 cm      LV E/e' lateral: 7.6 LV SV:         46 LV SV Index:   21 LVOT Area:     3.46 cm  LV Volumes (MOD) LV vol d, MOD A4C: 198.0 ml LV vol s, MOD A4C: 98.5 ml LV SV MOD A4C:     198.0 ml RIGHT VENTRICLE TAPSE (M-mode): 2.1 cm LEFT ATRIUM           Index        RIGHT ATRIUM           Index LA Vol (A4C): 56.0 ml 25.87 ml/m  RA Area:     11.10 cm                                    RA Volume:   20.60 ml  9.52 ml/m  AORTIC VALVE AV Area (Vmax):    1.99 cm AV Area (Vmean):   1.86 cm AV Area (VTI):     1.87 cm AV Vmax:           146.00 cm/s AV Vmean:          109.000 cm/s AV VTI:            0.244 m AV Peak Grad:  8.5 mmHg AV Mean Grad:      5.0 mmHg LVOT Vmax:         83.80 cm/s LVOT Vmean:        58.400 cm/s LVOT VTI:          0.132 m LVOT/AV VTI ratio: 0.54  AORTA Ao Root diam: 3.60 cm Ao Asc diam:   4.00 cm MITRAL VALVE MV Area (PHT): 5.66 cm    SHUNTS MV Area VTI:   3.22 cm    Systemic VTI:  0.13 m MV Peak grad:  3.1 mmHg    Systemic Diam: 2.10 cm MV Mean grad:  1.0 mmHg MV Vmax:       0.88 m/s MV Vmean:      48.6 cm/s MV Decel Time: 134 msec MV E velocity: 35.70 cm/s MV A velocity: 76.10 cm/s MV E/A ratio:  0.47 Vishnu Priya Mallipeddi Electronically signed by Winfield Rast Mallipeddi Signature Date/Time: 02/15/2023/2:14:49 PM    Final    MR BRAIN WO CONTRAST Result Date: 02/15/2023 CLINICAL DATA:  Stroke follow-up. Status post revascularization of right P2 occlusion. EXAM: MRI HEAD WITHOUT CONTRAST TECHNIQUE: Multiplanar, multiecho pulse sequences of the brain and surrounding structures were obtained without intravenous contrast. COMPARISON:  Head CT and MRI 02/14/2023 FINDINGS: The patient was unable to tolerate the complete examination which was terminated prematurely. Axial and coronal diffusion, axial T2, axial FLAIR, and sagittal T1 sequences were obtained and are mildly to moderately motion degraded. Brain: Restricted diffusion is again seen within the mesial right temporal lobe and right thalamus, mildly progressed from the prior MRI and with new hypointense signal centrally within the right thalamic infarct being suspicious for a small amount of hemorrhage (with assessment limited on this incomplete examination which did not include susceptibility weighted or gradient echo imaging). Patchy restricted diffusion in the right periatrial white matter and in the region of the posterior body and tail of the caudate nucleus is largely new, and there is also a new 2 cm focus of restricted diffusion in the right occipital lobe. 2 new punctate acute infarcts are present in the left occipital lobe. Patchy T2 hyperintensities elsewhere in the cerebral white matter bilaterally are unchanged and nonspecific but compatible with mild chronic small vessel ischemic disease. 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 There is no midline shift or extra-axial fluid collection. There is mild generalized cerebral atrophy. Small chronic right cerebellar infarcts are again noted. Vascular: Major intracranial vascular flow voids are preserved. Skull and upper cervical spine: No suspicious marrow lesion. Advanced left facet arthrosis at C3-4 and C4-5. Sinuses/Orbits: Right cataract extraction. No evidence of significant inflammation in the paranasal sinuses. Clear mastoid air cells. Other: None. IMPRESSION: 1. Incomplete, motion degraded examination. 2. Progressive acute right PCA territory infarct with extension most notable in the right periatrial white matter and occipital lobe. Suspected subcentimeter hemorrhage centrally in the right thalamic infarct. 3. New punctate acute infarcts in the left occipital lobe. 4. Mild chronic small vessel ischemic disease. 5. Known left anterior parafalcine meningioma. Electronically Signed   By: Sebastian Ache M.D.   On: 02/15/2023 10:35    Vitals:   02/15/23 1548 02/15/23 1600 02/15/23 1630 02/15/23 1700  BP:  (!) 124/94    Pulse:  85 79 74  Resp:  (!) 23 20 (!) 21  Temp: 98.5 F (36.9 C)     TempSrc: Oral     SpO2:  94% 96% 97%  Weight:      Height:  PHYSICAL EXAM General:  Alert, well-nourished, well-developed patient in no acute distress Psych:  Mood and affect appropriate for situation CV: Regular rate and rhythm on monitor Respiratory:  Regular, unlabored respirations on room air GI: Abdomen soft and nontender  Neuro - awake, alert, eyes open, orientated to age, place, time and people. No aphasia, fluent language, following all simple commands. Able to name and repeat. No gaze palsy, tracking bilaterally, visual field full. No significant facial droop. Tongue midline.  Left upper extremity 2+/5, finger movement 3/5.  Left lower extremity proximal 3 -/5, toe movements 3/5.  Diminished left light touch sensation.  Right FTN intact, gait not tested.     ASSESSMENT/PLAN Mr. JUANCARLOS CRESCENZO is a 73 y.o. male withPMH significant for HTN, prior cerebellar stroke, hypothyroidism who was BIB EMS as a CODE STROKE 2/1 early AM due to left-sided weakness, numbness, dysconjugate gaze, slight R gaze preference and inconsistent left visual field testing. Per EMS, wife stated his LKW was around 0115.  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 Thrombectomy was discussed by neurologist Dr. Derry Lory, IR Dr. Corliss Skains and family who consented. However, patient improved from an NIH of 11 to an NIH of 3 en route to IR suite and it was felt that deficits were too small to intervene, but appropriate for TNKase.  STAT MRI showed diffusion restriction in R medial temporal with no signal change on FLAIR. At that time, NIH was fluctuating from 3-5 due to L arm and leg weakness fluctuating in intensity.   Stroke:  right PCA scattered infarct with thalamic HT and punctate left PCA infarcts with R P2 occlusion s/p TNK and IR are with TICI3 repeat fusion, etiology: Concerning for cardioembolic source Code Stroke CT head - No acute abnormality.  3 cm anterior left parafalcine meningioma, enlarged from 2015. CTA head & neck w/Perfusion Right P2 occlusion. Extensive intracranial atherosclerosis at bilateral ICA bulbs and siphons. CTP 0/25cc  MRI Diffusion hyperintensity in the right PCA distribution affecting the medial temporal lobe and thalamus. FLAIR hyperintensity is not yet detected. left anterior parafalcine meningioma. NIHSS 11->3->7 Repeat CTH Subtle but likely cytotoxic edema now seen at areas of right temporal and thalamic diffusion abnormality Repeat CT Perfusion 0/36 S/p IR with right distal P2 occlusion and TICI3 reperfusion MRI Progressive acute right PCA territory infarct with extension most notable in the right periatrial white matter and occipital lobe. Suspected subcentimeter hemorrhage centrally in the right thalamic  infarct. New punctate acute infarcts in the left occipital lobe. CT repeat in am 2D Echo EF 55%, no LV thrombus LE venous Doppler pending Will consider loop recorder to rule out A-fib LDL 30 HgbA1c 6.4 UDS negative VTE prophylaxis -Lovenox aspirin 325 mg daily prior to admission, now on ASA 81 given HT at right thalamus.  Therapy recommendations: CIR Disposition:  pending  History of stroke 03/2013 right PICA small infarct, EF 35 to 40%, LDL 112.  Discharged on aspirin 81 and statin.  History of CHF 03/2013 2D echo EF 35 to 40% Home medication including metoprolol, spironolactone 2D echo this admission EF 55% Resumed home metoprolol and spironolactone  Hypertension Home meds: Spironolactone 25, toprol-xl 100mg  Cleviprex gtt, wean as tolerated Resumed home spironolactone and metoprolol Labetalol IV PRN BP goal less than 160 given HT Monitor BP goal normotensive  Hyperlipidemia Home meds: Lipitor 80 LDL 30, goal < 70 Resume home Lipitor 80 Consider statin on discharge  Sundowning Likely mild baseline cognitive impairment Resume  home Effexor Add Seroquel low-dose nightly  Other Stroke Risk Factors Obesity, Body mass index is 32.95 kg/m., BMI >/= 30 associated with increased stroke risk, recommend weight loss, diet and exercise as appropriate  Former smoker  Other acute issues Gout, resume allopurinol Stable left frontal meningioma AKI, creatinine 1.20--1.30--0.96, on IV fluid  Hospital day # 1   Marvel Plan, MD PhD Stroke Neurology 02/15/2023 5:47 PM  This patient is critically ill due to right PCA and left PCA stroke, right P2 occlusion status post thrombectomy, CHF and at significant risk of neurological worsening, death form recurrent stroke, hemorrhagic transmission, heart failure. This patient's care requires constant monitoring of vital signs, hemodynamics, respiratory and cardiac monitoring, review of multiple databases, neurological assessment, discussion  with family, other specialists and medical decision making of high complexity. I spent 40 minutes of neurocritical care time in the care of this patient. I had long discussion with patient and brother at bedside, updated pt current condition, treatment plan and potential prognosis, and answered all the questions. They expressed understanding and appreciation.       To contact Stroke Continuity provider, please refer to [email or web address]. After hours, contact General Neurology

## 2023-02-15 NOTE — Evaluation (Signed)
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   Special screening for malignant neoplasms, colon 04/09/2017   Dizziness 03/29/2013   Cerebellar stroke, acute (HCC) 03/29/2013   CHF (congestive heart failure) (HCC) 03/28/2013   Vertigo 03/28/2013   Past Medical History:  Past Medical History:  Diagnosis Date   Cancer (HCC) 2002   prostate   CHF (congestive heart failure) (HCC)    Gout    Hypertension    Thyroid disease    Past Surgical History:  Past Surgical History:  Procedure Laterality Date   COLONOSCOPY N/A 07/15/2017   Procedure: COLONOSCOPY;  Surgeon: Malissa Hippo, MD;  Location: [facility];  Service: Endoscopy;  Laterality: N/A;  830   POLYPECTOMY  07/15/2017   Procedure: POLYPECTOMY;  Surgeon: Malissa Hippo, MD;  Location: [facility];  Service: Endoscopy;;  colon   PROSTATE SURGERY     HPI:  Mr. EULISES KIJOWSKI is a 73 y.o. male withPMH significant for HTN, prior cerebellar stroke, hypothyroidism who was BIB EMS as a CODE STROKE 2/1 early AM due to left-sided weakness, numbness, dysconjugate gaze, slight R gaze preference and inconsistent left visual field testing. Per EMS, wife stated his LKW was around 0115.   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 Thrombectomy was discussed by neurologist Dr. Derry Lory, IR Dr. Corliss Skains and family who consented. However, patient improved from an NIH of 11 to an NIH of 3 en route to IR suite and it was felt that deficits were too small to intervene, but appropriate for TNKase.   Assessment / Plan / Recommendation Clinical Impression  Pt was seen for a  cognitive-linguistic evaluation and he presents with a moderate cognitive impairment with deficits observed in the area of orientation, short-term memory, sustained attention, complex problem solving, and executive functioning.  Pt completed the [employer] Mental Status Examination (SLUMS) and scored 13/30 (n>/=27/30).  See below for additional information.  Expressive and receptive language were functional and no dysarthria was observed.  Pt and grandchild reported that pt is fully independent at baseline and works full time as a Education officer, environmental; therefore cognitive deficits appear to be acute.  Recommend continuation of skilled ST acutely and at time of discharge in addition to supervision/assistance with IADLs (medication, finances, etc.).  Educated pt and family regarding recommendations and they verbalized understanding.  [facility] SLUMS Examination Orientation  2/3  Numeric Problem Solving  1/3  Memory  2/5  Attention 1/2  Thought Organization 1/3  Clock Drawing 0/4  Visuospatial Skills    2/2  Short Story Recall  4/8  Total  13/30   Scoring  High School Education  Less than High School Education   Normal  27-30 25-30  Mild Neurocognitive Disorder 21-26 20-24  Dementia  1-20 1-19       SLP Assessment  SLP Recommendation/Assessment: Patient needs continued Speech Lanaguage Pathology Services SLP Visit Diagnosis: Cognitive communication deficit (R41.841)    Recommendations for follow up therapy are one component of a multi-disciplinary discharge planning process, led by the attending physician.  Recommendations may be updated based on patient status, additional functional criteria and insurance authorization.    Follow Up  Recommendations  Home health SLP (for cognitive deficits, no f/u for dysphagia)    Assistance Recommended at Discharge     Functional Status Assessment Patient has had a recent decline in their functional status and demonstrates the ability to make significant  improvements in function in a reasonable and predictable amount of time.  Frequency and Duration min 2x/week  2 weeks      SLP Evaluation Cognition  Overall Cognitive Status: Impaired/Different from baseline Arousal/Alertness: Lethargic Orientation Level: Oriented to person;Oriented to place;Oriented to time;Oriented to situation (disoriented to year) Year: Other (Comment) (2005) Day of Week: Correct Attention: Sustained;Focused Focused Attention: Appears intact Sustained Attention: Impaired Sustained Attention Impairment: Verbal complex Memory: Impaired Memory Impairment: Decreased short term memory Decreased Short Term Memory: Verbal basic Awareness: Impaired Awareness Impairment: Emergent impairment Problem Solving: Impaired Problem Solving Impairment: Verbal complex Executive Function: Organizing Organizing: Impaired Organizing Impairment: Functional complex Safety/Judgment: Appears intact       Comprehension  Auditory Comprehension Overall Auditory Comprehension: Appears within functional limits for tasks assessed    Expression Expression Primary Mode of Expression: Verbal Verbal Expression Overall Verbal Expression: Appears within functional limits for tasks assessed Written Expression Dominant Hand: Right   Oral / Motor  Oral Motor/Sensory Function Overall Oral Motor/Sensory Function: Within functional limits Motor Speech Overall Motor Speech: Appears within functional limits for tasks assessed           Eino Farber, M.S., CCC-SLP Acute Rehabilitation Services Office: [phone]  Shanon Rosser Shahrzad Koble 02/15/2023, 10:20 AM

## 2023-02-15 NOTE — Evaluation (Signed)
Physical Therapy Evaluation Patient Details Name: Edward Rodgers MRN: [id number] DOB: 1950/11/30 Today's Date: 02/15/2023  History of Present Illness  Patient is a 73 y/o male admitted 02/14/23 due to L side weakness, numbness and dysconjugate gaze, R gaze preference and inconsistent L visual field testing.  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 Intermittent improvements and worsening in NIHSS and noted increase in prenumbra to 36 so decided to take for thrombectomy R PCA distal P2 segment.  PMH positive for CHF, gout, HTN, thyroid disease, Prostate CA and prior CVA with residual diplopia.  Clinical Impression  Patient presents with decreased mobility due to L hemiparesis, decreased balance with impaired midline orientation, decreased vision, decreased activity tolerance and high fall risk.  Patient previously living with wife and completing mobility with occasional use of QC and independent with BADL's.  Patient currently needing +2 mod to max A for up to EOB and transfer to recliner.  He will benefit from skilled PT in the acute setting and will benefit from post-acute inpatient rehab (>3 hours/day) prior to d/c home with wife assist.         If plan is discharge home, recommend the following: Two people to help with walking and/or transfers;Two people to help with bathing/dressing/bathroom;Help with stairs or ramp for entrance;Assist for transportation;Direct supervision/assist for financial management;Assistance with cooking/housework;Supervision due to cognitive status   Can travel by private vehicle        Equipment Recommendations Other (comment) (TBA)  Recommendations for Other Services  Rehab consult    Functional Status Assessment Patient has had a recent decline in their functional status and demonstrates the ability to make significant improvements in function in a reasonable and predictable amount of time.     Precautions / Restrictions  Precautions Precautions: Fall Precaution Comments: pusher L; glasses, some L lower field cut      Mobility  Bed Mobility Overal bed mobility: Needs Assistance Bed Mobility: Sidelying to Sit, Rolling Rolling: Used rails, Mod assist Sidelying to sit: Used rails, Max assist, +2 for physical assistance       General bed mobility comments: assist for legs off EOB and to lift trunk upright; scoot hips to EOB    Transfers Overall transfer level: Needs assistance   Transfers: Sit to/from Stand, Bed to chair/wheelchair/BSC Sit to Stand: Max assist, +2 physical assistance Stand pivot transfers: Max assist, +2 physical assistance         General transfer comment: up to stand with heavy L lean initially, sat back and cues for head over R knee to stand with improved upright posture, then assist and cues for R lateral weight shift and A to move L foot then pt stepping over with R with support to L. Lowered to edge of chair and assist to scoot back    Ambulation/Gait               General Gait Details: unable  Stairs            Wheelchair Mobility     Tilt Bed    Modified Rankin (Stroke Patients Only) Modified Rankin (Stroke Patients Only) Pre-Morbid Rankin Score: No significant disability Modified Rankin: Severe disability     Balance Overall balance assessment: Needs assistance Sitting-balance support: Single extremity supported, Feet supported Sitting balance-Leahy Scale: Poor Sitting balance - Comments: sat with S to CGA with L hand holding footboard; assist with unsupported sitting due to L lateral lean Postural control: Left lateral lean  Standing balance support: Bilateral upper extremity supported Standing balance-Leahy Scale: Zero Standing balance comment: +2 A for standing                             Pertinent Vitals/Pain Pain Assessment Pain Assessment: No/denies pain    Home Living Family/patient expects to be discharged to:: Private  residence Living Arrangements: Spouse/significant other Available Help at Discharge: Family;Available PRN/intermittently Type of Home: House Home Access: Stairs to enter   Entrance Stairs-Number of Steps: 1 Alternate Level Stairs-Number of Steps: wood stove in basement Home Layout: Two level;Able to live on main level with bedroom/bathroom;Laundry or work area in [employer] Equipment: Production assistant, radio - quad;Hand held shower head      Prior Function Prior Level of Function : Independent/Modified Independent             Mobility Comments: usses cane when he thinks about it       Extremity/Trunk Assessment   Upper Extremity Assessment Upper Extremity Assessment: Defer to OT evaluation    Lower Extremity Assessment Lower Extremity Assessment: LLE deficits/detail LLE Deficits / Details: unable to activate without assist; flexion with stretch to extensors and pt able to extend the leg; no active ankle movement, noted some numbness compared to R LLE Sensation: decreased light touch;decreased proprioception LLE Coordination: decreased gross motor    Cervical / Trunk Assessment Cervical / Trunk Assessment: Kyphotic  Communication   Communication Communication: No apparent difficulties  Cognition Arousal: Alert Behavior During Therapy: WFL for tasks assessed/performed Overall Cognitive Status: Impaired/Different from baseline Area of Impairment: Attention, Safety/judgement                   Current Attention Level: Sustained     Safety/Judgement: Decreased awareness of deficits, Decreased awareness of safety     General Comments: cues for upright posture, pushes L feeling he is falling R when he is falling L and noted decreased L side awareness        General Comments General comments (skin integrity, edema, etc.): BP 161/101 after up in chair, SpO2 > 90% on RA    Exercises     Assessment/Plan    PT Assessment Patient needs continued PT services  PT  Problem List Decreased strength;Decreased cognition;Decreased activity tolerance;Decreased balance;Decreased mobility;Decreased knowledge of precautions;Decreased safety awareness;Decreased knowledge of use of DME;Impaired sensation;Decreased coordination       PT Treatment Interventions DME instruction;Therapeutic exercise;Balance training;Wheelchair mobility training;Gait training;Functional mobility training;Cognitive remediation;Therapeutic activities;Patient/family education    PT Goals (Current goals can be found in the Care Plan section)  Acute Rehab PT Goals Patient Stated Goal: return home PT Goal Formulation: With patient Time For Goal Achievement: 03/01/23 Potential to Achieve Goals: Good    Frequency Min 1X/week     Co-evaluation PT/OT/SLP Co-Evaluation/Treatment: Yes Reason for Co-Treatment: Complexity of the patient's impairments (multi-system involvement);Necessary to address cognition/behavior during functional activity;For patient/therapist safety;To address functional/ADL transfers PT goals addressed during session: Mobility/safety with mobility;Balance         AM-PAC PT "6 Clicks" Mobility  Outcome Measure Help needed turning from your back to your side while in a flat bed without using bedrails?: A Lot Help needed moving from lying on your back to sitting on the side of a flat bed without using bedrails?: Total Help needed moving to and from a bed to a chair (including a wheelchair)?: Total Help needed standing up from a chair using your arms (e.g., wheelchair or bedside  chair)?: Total Help needed to walk in hospital room?: Total Help needed climbing 3-5 steps with a railing? : Total 6 Click Score: 7    End of Session Equipment Utilized During Treatment: Gait belt Activity Tolerance: Patient limited by fatigue Patient left: in chair;with call bell/phone within reach;with chair alarm set   PT Visit Diagnosis: Other abnormalities of gait and mobility  (R26.89);Other symptoms and signs involving the nervous system (R29.898);Hemiplegia and hemiparesis Hemiplegia - Right/Left: Left Hemiplegia - dominant/non-dominant: Non-dominant Hemiplegia - caused by: Cerebral infarction    Time: 1610-9604 PT Time Calculation (min) (ACUTE ONLY): 28 min   Charges:   PT Evaluation $PT Eval Moderate Complexity: 1 Mod   PT General Charges $$ ACUTE PT VISIT: 1 Visit         Sheran Lawless, PT Acute Rehabilitation Services Office:[phone] 02/15/2023   Elray Mcgregor 02/15/2023, 3:51 PM

## 2023-02-15 NOTE — Progress Notes (Signed)
 Inpatient Rehab Admissions Coordinator:  ? ?Per therapy recommendations,  patient was screened for CIR candidacy by Megan Salon, MS, CCC-SLP. At this time, Pt. Appears to be a a potential candidate for CIR. I will place   order for rehab consult per protocol for full assessment. Please contact me any with questions. ? ?Megan Salon, MS, CCC-SLP ?Rehab Admissions Coordinator  ?[phone] (celll) ?[phone] (office) ? ?

## 2023-02-15 NOTE — Progress Notes (Cosign Needed Addendum)
Referring Physician(s): Code Stroke  Supervising Physician: Julieanne Cotton  Patient Status:  [facility] - In-pt  Chief Complaint:  Occluded  right posterior cerebral artery distal P2 segment s/p thrombectomy  Subjective:  Patient reporting back pain. Requesting pain medication. RN made aware.   Allergies: Patient has no known allergies.  Medications: Prior to Admission medications   Medication Sig Start Date End Date Taking? Authorizing Provider  alendronate (FOSAMAX) 70 MG tablet Take 70 mg by mouth once a week. Take with a full glass of water on an empty stomach. mondays   Yes [provider]  allopurinol (ZYLOPRIM) 300 MG tablet Take 300 mg by mouth every morning.   Yes [provider]  aspirin 325 MG tablet Take 1 tablet (325 mg total) by mouth daily. 07/16/17  Yes Rehman, Joline Maxcy, MD  atorvastatin (LIPITOR) 80 MG tablet Take 80 mg by mouth daily.   Yes [provider]  Calcium Carb-Cholecalciferol (CALCIUM 600 + D PO) Take 1 tablet by mouth in the morning and at bedtime.   Yes [provider]  empagliflozin (JARDIANCE) 25 MG TABS tablet Take 12.5 mg by mouth daily.   Yes [provider]  latanoprost (XALATAN) 0.005 % ophthalmic solution Place 1 drop into the right eye at bedtime.   Yes [provider]  levothyroxine (SYNTHROID) 88 MCG tablet Take 88 mcg by mouth daily before breakfast.   Yes [provider]  meloxicam (MOBIC) 7.5 MG tablet Take 7.5 mg by mouth daily.   Yes [provider]  metoprolol succinate (TOPROL-XL) 100 MG 24 hr tablet Take 100 mg by mouth daily. Take with or immediately following a meal.   Yes [provider]  mirabegron ER (MYRBETRIQ) 50 MG TB24 tablet Take 50 mg by mouth daily.   Yes [provider]  spironolactone (ALDACTONE) 25 MG tablet Take 25 mg by mouth daily.   Yes [provider]  venlafaxine XR (EFFEXOR-XR) 150 MG 24 hr capsule Take 150 mg by  mouth every evening.   Yes [provider]     Vital Signs: BP 126/71   Pulse 85   Temp 98.1 F (36.7 C) (Oral)   Resp 17   Ht 5\' 9"  (1.753 m)   Wt 223 lb 1.7 oz (101.2 kg)   SpO2 94%   BMI 32.95 kg/m   Physical Exam Vitals and nursing note reviewed.  Constitutional:      Appearance: He is well-developed.  HENT:     Head: Normocephalic.  Cardiovascular:     Comments: Right groin access site is soft with no active bleeding and no appreciable pseudoaneurysm. Dressing is some oozing nothing active. RN to remove dressing.  Pulmonary:     Effort: Pulmonary effort is normal.  Musculoskeletal:     Cervical back: Normal range of motion.  Skin:    General: Skin is warm and dry.  Neurological:     Mental Status: He is alert and oriented to person, place, and time. Mental status is at baseline.     Comments: Alert, aware and oriented X 3 Speech and comprehension is intact.  No facial droop noted Tongue midline Left sided hemianopsia  Can spontaneously move all 4 extremities. Distal pulses (DP's) palpable bilaterally w  Speech, cognition and language   intact.  Comprehension and fluency are normal.  Judgment and insight normal  Negative pronator drift. Gait not assessed Romberg not assessed Heel to toe not assessed     Imaging: CT HEAD WO  CONTRAST ( ) Result Date: 02/14/2023 CLINICAL DATA:  Stroke follow-up EXAM: CT HEAD WITHOUT CONTRAST TECHNIQUE: Contiguous axial images were obtained from the base of the skull through the vertex without intravenous contrast. RADIATION DOSE REDUCTION: This exam was performed according to the departmental dose-optimization program which includes automated exposure control, adjustment of the mA and/or kV according to patient size and/or use of iterative reconstruction technique. COMPARISON:  Brain MRI from earlier today FINDINGS: Brain: There is likely gray-white differentiation loss asymmetrically in the medial right temporal lobe,  site of restricted diffusion previously. The right thalamic MR signal abnormality has also become apparent. No hemorrhage, hydrocephalus, or shift. Chronic small vessel ischemia in the cerebral white matter and known partially calcified left anterior parafalcine meningioma. Vascular: Diffusely high-density vessels in the setting of recent contrast administration. Reference prior CTA. Skull: Normal. Negative for fracture or focal lesion. Sinuses/Orbits: No acute finding. IMPRESSION: Subtle but likely cytotoxic edema now seen at areas of right temporal and thalamic diffusion abnormality. No intracranial hemorrhage. Electronically Signed   By: Tiburcio Pea M.D.   On: 02/14/2023 09:15   CT CEREBRAL PERFUSION W CONTRAST Result Date: 02/14/2023 CLINICAL DATA:  Stroke workup EXAM: CT PERFUSION BRAIN TECHNIQUE: Multiphase CT imaging of the brain was performed following IV bolus contrast injection. Subsequent parametric perfusion maps were calculated using RAPID software. RADIATION DOSE REDUCTION: This exam was performed according to the departmental dose-optimization program which includes automated exposure control, adjustment of the mA and/or kV according to patient size and/or use of iterative reconstruction technique. CONTRAST:  40mL OMNIPAQUE IOHEXOL 350 MG/ML SOLN COMPARISON:  CT perfusion from earlier the same day FINDINGS: CT Brain Perfusion Findings: CBF (<30%) Volume: 0mL Perfusion (Tmax>6.0s) volume: 36mL in the right PCA distribution primarily seen at the occipital lobe. IMPRESSION: Increased ischemic range perfusion in the right PCA distribution measuring 36 cc as compared to 25 cc earlier today. Electronically Signed   By: Tiburcio Pea M.D.   On: 02/14/2023 09:08   MR BRAIN WO CONTRAST Result Date: 02/14/2023 CLINICAL DATA:  Wake up stroke EXAM: MRI HEAD WITHOUT CONTRAST TECHNIQUE: Multiplanar, multiecho pulse sequences of the brain and surrounding structures were obtained without intravenous  contrast. COMPARISON:  Head CTA from earlier today FINDINGS: Brain: Restricted diffusion in the right thalamus, right periatrial white matter, and medial temporal lobe at the hippocampus without any FLAIR changes. Case discussed while in progress with neurology. No acute infarct seen in a different vascular distribution. Known anterior left parafalcine meningioma with signal of dense cellularity, reference measurements by CT given this noncontrast study. There is mass effect on the adjacent frontal lobe without edema. Mild chronic small vessel ischemia in the cerebral white matter. Small chronic right cerebellar infarct. No acute hemorrhage, hydrocephalus, or collection. Vascular: No detected change from prior. Skull and upper cervical spine: Normal marrow signal. Sinuses/Orbits: Unremarkable IMPRESSION: Diffusion hyperintensity in the right PCA distribution affecting the medial temporal lobe and thalamus. FLAIR hyperintensity is not yet detected. Chronic small vessel ischemia and left anterior parafalcine meningioma. Electronically Signed   By: Tiburcio Pea M.D.   On: 02/14/2023 05:43   CT ANGIO HEAD NECK W WO CM W PERF (CODE STROKE) Result Date: 02/14/2023 CLINICAL DATA:  Neuro deficit with acute stroke suspected. EXAM: CT ANGIOGRAPHY HEAD AND NECK CT PERFUSION BRAIN TECHNIQUE: Multidetector CT imaging of the head and neck was performed using the standard protocol during bolus administration of intravenous contrast. Multiplanar CT image reconstructions and MIPs were obtained to evaluate the vascular anatomy. Carotid  stenosis measurements (when applicable) are obtained utilizing NASCET criteria, using the distal internal carotid diameter as the denominator. Multiphase CT imaging of the brain was performed following IV bolus contrast injection. Subsequent parametric perfusion maps were calculated using RAPID software. RADIATION DOSE REDUCTION: This exam was performed according to the departmental  dose-optimization program which includes automated exposure control, adjustment of the mA and/or kV according to patient size and/or use of iterative reconstruction technique. CONTRAST:  Dose is not known on this in progress study. COMPARISON:  Head CT from earlier today FINDINGS: Aortic arch: Atheromatous plaque with 3 vessel branching. Right carotid system: Calcified plaque in the proximal ICA without significant stenosis, ulceration, or beading. Left carotid system: Mild atheromatous plaque for age mainly at the proximal ICA without stenosis or ulceration. Vertebral arteries: Proximal subclavian arteries. Calcified plaque at both vertebral ostia. No flow reducing stenosis, ulceration, or dissection. Skeleton: Generalized cervical spine degeneration with mild scoliosis. Other neck: No acute finding Upper chest: Emphysema and airway thickening. No acute finding. Review of the MIP images confirms the above findings CTA HEAD FINDINGS Limited by venous contamination. Anterior circulation: No emergent branch occlusion, segmental narrowing, aneurysm, or vascular malformation. There is diffuse atheromatous irregularity affecting the MCA and ACA distributions without correctable and proximal flow reducing stenosis. Posterior circulation: The vertebral and basilar arteries are atheromatous and tortuous without occlusion or flow reducing stenosis. Abrupt cut off at the right P2 segment with some downstream reconstitution. Venous sinuses: Unremarkable Anatomic variants: None significant CT Brain Perfusion Findings: ASPECTS: 10 CBF (<30%) Volume: 0mL Perfusion (Tmax>6.0s) volume: 67mL-in the right PCA distribution Findings discussed with neurology before signing. IMPRESSION: 1. Right P2 occlusion, presumably acute in this setting. There is 25 cc of relative ischemia by CT perfusion in this distribution. 2. Extensive intracranial atherosclerosis. 3. Atherosclerosis in the neck without flow reducing stenosis or suspicious  embolic source. Electronically Signed   By: Tiburcio Pea M.D.   On: 02/14/2023 04:43   CT HEAD CODE STROKE WO CONTRAST Result Date: 02/14/2023 CLINICAL DATA:  Code stroke. EXAM: CT HEAD WITHOUT CONTRAST TECHNIQUE: Contiguous axial images were obtained from the base of the skull through the vertex without intravenous contrast. RADIATION DOSE REDUCTION: This exam was performed according to the departmental dose-optimization program which includes automated exposure control, adjustment of the mA and/or kV according to patient size and/or use of iterative reconstruction technique. COMPARISON:  03/29/2013 FINDINGS: Brain: No evidence of acute infarction, hemorrhage, hydrocephalus, extra-axial collection. Partially calcified anterior left parafalcine mass consistent with meningioma, 3 cm in maximal size with mass effect on the local frontal lobe. No evidence of adjacent brain edema. Vascular: No hyperdense vessel or unexpected calcification. Skull: Normal. Negative for fracture or focal lesion. Sinuses/Orbits: No acute finding. Other: Prelim sent in epic chat. ASPECTS [facility] Stroke Program Early CT Score) Ten on both sides. IMPRESSION: 1. No acute finding. 2. 3 cm anterior left parafalcine meningioma that has enlarged from 2015. Electronically Signed   By: Tiburcio Pea M.D.   On: 02/14/2023 04:19    Labs:  CBC: Recent Labs    02/14/23 0406 02/14/23 0407 02/15/23 0501  WBC 5.3  --  6.8  HGB 13.3 15.0 12.8*  HCT 41.6 44.0 39.5  PLT 177  --  188    COAGS: Recent Labs    02/14/23 0406  INR 1.0  APTT 23*    BMP: Recent Labs    08/23/22 1326 02/14/23 0406 02/14/23 0407 02/15/23 0501  NA 139 137 139 136  K  4.2 4.0 4.3 3.2*  CL 104 105 105 104  CO2 23 22  --  25  GLUCOSE 100* 115* 114* 118*  BUN 22 24* 34* 8  CALCIUM 9.4 9.4  --  9.3  CREATININE 1.40* 1.20 1.30* 0.96  GFRNONAA 54* >60  --  >60    LIVER FUNCTION TESTS: Recent Labs    02/14/23 0406  BILITOT 0.2  AST 19  ALT  19  ALKPHOS 46  PROT 6.8  ALBUMIN 3.6    Assessment and Plan:  73 y.o. male inpatient. History of HTN, CHF, prostate cancer, cerebellar stroke. Presented to the ED at [facility] on 2.1.25  as a Code Stroke with left sided weakness, and right sided gaze. Status post complete revascularization of the occluded  right posterior cerebral artery distal P2 segment with 1 pass with an 043 distal aspiration and 062 aspiration combination, achieving a TICI 3 revascularization. With NIR Dr. Julieanne Cotton. Patient just returned from MRI.  Patient seen at bedside. Family present.  Alert, aware and oriented X 3 Speech and comprehension is intact.  No facial droop noted Tongue midline Left sided hemianopsia  Can spontaneously move all 4 extremities. Distal pulses (DP's) palpable bilaterally w  Speech, cognition and language   intact.  Comprehension and fluency are normal.  Judgment and insight normal  Negative pronator drift. Gait not assessed Romberg not assessed Heel to toe not assessed   Right groin access site is soft with no active bleeding and no appreciable pseudoaneurysm. Dressing is some oozing nothing active. RN to remove dressing.    Patient to stable from IR perspective s/p occluded  right posterior cerebral artery distal P2 segment s/p thrombectomy further plans per Stroke Team please call IR with questions or concerns.   Electronically Signed: Alene Mires, NP 02/15/2023, 8:09 AM   I spent a total of 15 Minutes at the patient's bedside AND on the patient's [facility] floor or unit, greater than 50% of which was counseling/coordinating care for occluded right posterior cerebral artery distal P2 segment s/p thrombectomy

## 2023-02-15 NOTE — Progress Notes (Signed)
MRI called at 2300 about timing for MRI scan. MRI said they would message via secure chat. Never received message from MRI.  Discussed MRI and how patient may not be able to be still for scan with Neuro MD.   Transport arrived at 0400 to assist to MRI. Unable to take patient to MRI at this time, IV access compromised and order for IV team placed.   New IV access obtained at 0515 and MRI called at 0530 with no answer.

## 2023-02-15 NOTE — Progress Notes (Addendum)
Stroke MD called to verify imaging order.  MRI to be completed instead of 24 hour CT scan.    1030: Stroke MD blood pressure goal less than 160.  Titrating medication to goal.

## 2023-02-15 NOTE — Evaluation (Signed)
Clinical/Bedside Swallow Evaluation Patient Details  Name: Edward Rodgers MRN: [id number] Date of Birth: Feb 27, 1950  Today's Date: 02/15/2023 Time: SLP Start Time (ACUTE ONLY): 0931 SLP Stop Time (ACUTE ONLY): 0944 SLP Time Calculation (min) (ACUTE ONLY): 13 min  Past Medical History:  Past Medical History:  Diagnosis Date   Cancer (HCC) 2002   prostate   CHF (congestive heart failure) (HCC)    Gout    Hypertension    Thyroid disease    Past Surgical History:  Past Surgical History:  Procedure Laterality Date   COLONOSCOPY N/A 07/15/2017   Procedure: COLONOSCOPY;  Surgeon: Malissa Hippo, MD;  Location: [facility];  Service: Endoscopy;  Laterality: N/A;  830   POLYPECTOMY  07/15/2017   Procedure: POLYPECTOMY;  Surgeon: Malissa Hippo, MD;  Location: [facility];  Service: Endoscopy;;  colon   PROSTATE SURGERY     HPI:  [name] is a 73 y.o. male withPMH significant for HTN, prior cerebellar stroke, hypothyroidism who was BIB EMS as a CODE STROKE 2/1 early AM due to left-sided weakness, numbness, dysconjugate gaze, slight R gaze preference and inconsistent left visual field testing. Per EMS, wife stated his LKW was around 0115.   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 Thrombectomy was discussed by neurologist Dr. Derry Lory, IR Dr. Corliss Skains and family who consented. However, patient improved from an NIH of 11 to an NIH of 3 en route to IR suite and it was felt that deficits were too small to intervene, but appropriate for TNKase.    Assessment / Plan / Recommendation  Clinical Impression  Pt was seen for a bedside swallow evaluation and he presents with suspected functional oropharyngeal swallowing abilities.  Pt was encountered asleep in bed with family at bedside.  He was mildly lethargic throughout this evaluation, but was agreeable to assessment.  Oral mechanism exam was unremarkable.  Pt consumed trials of thin liquid, puree,  and regular solids.  Pt fed himself and he demonstrated good bolus acceptance, timely mastication, suspected timely AP transport/swallow initiation, and consistent hyolaryngeal elevation/excursion to observation and palpation.  No overt s/sx of aspiration were observed with any trials.  Recommend continuation of regular solids and thin liquids with medications administered whole with liquid.  No further skilled ST is warranted at this time targeting dysphagia.  Please re-consult if additional needs arise.    SLP Visit Diagnosis: Dysphagia, unspecified (R13.10)    Aspiration Risk  No limitations    Diet Recommendation Regular;Thin liquid    Liquid Administration via: Cup;Straw Medication Administration: Whole meds with liquid Supervision: Patient able to self feed    Other  Recommendations Oral Care Recommendations: Oral care BID    Recommendations for follow up therapy are one component of a multi-disciplinary discharge planning process, led by the attending physician.  Recommendations may be updated based on patient status, additional functional criteria and insurance authorization.  Follow up Recommendations Home health SLP (for cognitive deficits, no f/u for dysphagia)      Assistance Recommended at Discharge    Functional Status Assessment Patient has had a recent decline in their functional status and demonstrates the ability to make significant improvements in function in a reasonable and predictable amount of time.  Frequency and Duration min 2x/week  2 weeks       Prognosis Prognosis for improved oropharyngeal function: Good Barriers to Reach Goals: Cognitive deficits      Swallow Study  General Date of Onset: 02/15/23 HPI: [name] is a 73 y.o. male withPMH significant for HTN, prior cerebellar stroke, hypothyroidism who was BIB EMS as a CODE STROKE 2/1 early AM due to left-sided weakness, numbness, dysconjugate gaze, slight R gaze preference and inconsistent  left visual field testing. Per EMS, wife stated his LKW was around 0115.   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 Thrombectomy was discussed by neurologist Dr. Derry Lory, IR Dr. Corliss Skains and family who consented. However, patient improved from an NIH of 11 to an NIH of 3 en route to IR suite and it was felt that deficits were too small to intervene, but appropriate for TNKase. Type of Study: Bedside Swallow Evaluation Previous Swallow Assessment: N/A Diet Prior to this Study: Regular;Thin liquids (Level 0) Temperature Spikes Noted: No Respiratory Status: Room air History of Recent Intubation: No Behavior/Cognition: Cooperative;Pleasant mood;Lethargic/Drowsy Oral Cavity Assessment: Dry Oral Care Completed by SLP: No Oral Cavity - Dentition: Adequate natural dentition Vision: Functional for self-feeding Self-Feeding Abilities: Able to feed self Patient Positioning: Upright in bed Baseline Vocal Quality: Normal Volitional Swallow: Able to elicit    Oral/Motor/Sensory Function Overall Oral Motor/Sensory Function: Within functional limits   Ice Chips Ice chips: Not tested   Thin Liquid Thin Liquid: Within functional limits Presentation: Straw    Nectar Thick Nectar Thick Liquid: Not tested   Honey Thick Honey Thick Liquid: Not tested   Puree Puree: Within functional limits Presentation: Spoon   Solid     Solid: Within functional limits Presentation: Self Fed     Edward Rodgers, M.S., CCC-SLP Acute Rehabilitation Services Office: [phone]  Shanon Rosser Zamiah Tollett 02/15/2023,10:10 AM

## 2023-02-16 ENCOUNTER — Encounter (HOSPITAL_COMMUNITY): Payer: Self-pay | Admitting: Radiology

## 2023-02-16 ENCOUNTER — Inpatient Hospital Stay (HOSPITAL_COMMUNITY): Payer: Medicare Other

## 2023-02-16 DIAGNOSIS — I63531 Cerebral infarction due to unspecified occlusion or stenosis of right posterior cerebral artery: Secondary | ICD-10-CM | POA: Diagnosis not present

## 2023-02-16 LAB — BASIC METABOLIC PANEL
Anion gap: 12 (ref 5–15)
BUN: 15 mg/dL (ref 8–23)
CO2: 22 mmol/L (ref 22–32)
Calcium: 9.3 mg/dL (ref 8.9–10.3)
Chloride: 102 mmol/L (ref 98–111)
Creatinine, Ser: 1.44 mg/dL — ABNORMAL HIGH (ref 0.61–1.24)
GFR, Estimated: 52 mL/min — ABNORMAL LOW (ref >60)
Glucose, Bld: 107 mg/dL — ABNORMAL HIGH (ref 70–99)
Potassium: 3.5 mmol/L (ref 3.5–5.1)
Sodium: 136 mmol/L (ref 135–145)

## 2023-02-16 LAB — CBC
HCT: 40 % (ref 39.0–52.0)
Hemoglobin: 12.8 g/dL — ABNORMAL LOW (ref 13.0–17.0)
MCH: 28.1 pg (ref 26.0–34.0)
MCHC: 32 g/dL (ref 30.0–36.0)
MCV: 87.9 fL (ref 80.0–100.0)
Platelets: 174 10*3/uL (ref 150–400)
RBC: 4.55 MIL/uL (ref 4.22–5.81)
RDW: 14.5 % (ref 11.5–15.5)
WBC: 5.4 10*3/uL (ref 4.0–10.5)
nRBC: 0 % (ref 0.0–0.2)

## 2023-02-16 MED ORDER — CLOPIDOGREL BISULFATE 75 MG PO TABS
75.0000 mg | ORAL_TABLET | Freq: Every day | ORAL | Status: DC
  Administered 2023-02-16 – 2023-02-21 (×6): 75 mg via ORAL
  Filled 2023-02-16 (×6): qty 1

## 2023-02-16 MED ORDER — QUETIAPINE FUMARATE 50 MG PO TABS
50.0000 mg | ORAL_TABLET | Freq: Every day | ORAL | Status: DC
Start: 2023-02-16 — End: 2023-02-21
  Administered 2023-02-16 – 2023-02-20 (×5): 50 mg via ORAL
  Filled 2023-02-16 (×5): qty 1

## 2023-02-16 MED ORDER — SODIUM CHLORIDE 0.9 % IV SOLN: INTRAVENOUS | Status: DC

## 2023-02-16 MED ORDER — POTASSIUM CHLORIDE CRYS ER 20 MEQ PO TBCR
20.0000 meq | EXTENDED_RELEASE_TABLET | Freq: Once | ORAL | Status: AC
  Administered 2023-02-16: 20 meq via ORAL
  Filled 2023-02-16: qty 1

## 2023-02-16 MED ORDER — ENSURE ENLIVE PO LIQD
237.0000 mL | Freq: Two times a day (BID) | ORAL | Status: DC
  Administered 2023-02-16 – 2023-02-21 (×10): 237 mL via ORAL

## 2023-02-16 MED ORDER — AMLODIPINE BESYLATE 5 MG PO TABS
10.0000 mg | ORAL_TABLET | Freq: Every day | ORAL | Status: DC
  Administered 2023-02-16 – 2023-02-21 (×6): 10 mg via ORAL
  Filled 2023-02-16: qty 2
  Filled 2023-02-16: qty 1
  Filled 2023-02-16 (×4): qty 2

## 2023-02-16 MED ORDER — HEPARIN SODIUM (PORCINE) 5000 UNIT/ML IJ SOLN
5000.0000 [IU] | Freq: Three times a day (TID) | INTRAMUSCULAR | Status: DC
  Administered 2023-02-16 – 2023-02-21 (×15): 5000 [IU] via SUBCUTANEOUS
  Filled 2023-02-16 (×15): qty 1

## 2023-02-16 MED ORDER — POLYETHYLENE GLYCOL 3350 17 G PO PACK
17.0000 g | PACK | Freq: Every day | ORAL | Status: DC
  Administered 2023-02-16 – 2023-02-20 (×5): 17 g via ORAL
  Filled 2023-02-16 (×5): qty 1

## 2023-02-16 MED ORDER — LORAZEPAM 2 MG/ML IJ SOLN
1.0000 mg | Freq: Once | INTRAMUSCULAR | Status: AC
  Administered 2023-02-16: 1 mg via INTRAVENOUS
  Filled 2023-02-16: qty 1

## 2023-02-16 NOTE — Consult Note (Signed)
Physical Medicine and Rehabilitation Consult Reason for Consult: Impaired functional mobility after stroke Referring Physician: Roda Shutters   HPI: Edward Rodgers is a 73 y.o. male with history significant for hypertension and prior cerebellar stroke who presented on 02/14/2023 with acute left-sided weakness, numbness and disconjugate gaze with right gaze preference.  CT of the head was negative.  CTA showed right P2 occlusion with 25 cc penumbra in the right occipital lobe.  Patient improved on the way to the radiology suite so intervention was ultimately held.  She was given TNKase.  MRI of the brain initially demonstrated diffusion hyperintensity in the right medial temporal lobe and thalamus.  Follow-up MRI of the next day revealed progressive acute right PCA territory infarct with extension most notable in the right periatrial white matter and occipital lobe.  There is a suspected subcentimeter hemorrhage centrally in the right thalamic infarct.  New punctate acute infarcts in the left occipital lobe were also seen.  Patient was placed on Plavix in addition to his aspirin.  Stroke is concerning for cardioembolic event and loop recorder is being considered.  Patient with hypertension.  Home medications were resumed and amlodipine was added.  Patient is also dealt with acute kidney injury and delirium.  Patient was up with therapy yesterday and a max assist for sit to stand transfers.  He was unable to walk on this initial evaluation.  Patient lives with his spouse and I will two-level home with one-step to enter.  They are able to live on the first floor.   Review of Systems  Constitutional: Negative.   HENT: Negative.    Eyes:  Positive for blurred vision and double vision.  Respiratory: Negative.    Cardiovascular: Negative.   Gastrointestinal: Negative.   Genitourinary: Negative.   Skin: Negative.   Neurological:  Positive for dizziness, sensory change and focal weakness.   Psychiatric/Behavioral: Negative.     Past Medical History:  Diagnosis Date   Cancer [facility]) 2002   prostate   CHF (congestive heart failure) (HCC)    Gout    Hypertension    Thyroid disease    Past Surgical History:  Procedure Laterality Date   COLONOSCOPY N/A 07/15/2017   Procedure: COLONOSCOPY;  Surgeon: Malissa Hippo, MD;  Location: [facility] SUITE;  Service: Endoscopy;  Laterality: N/A;  830   POLYPECTOMY  07/15/2017   Procedure: POLYPECTOMY;  Surgeon: Malissa Hippo, MD;  Location: [facility];  Service: Endoscopy;;  colon   PROSTATE SURGERY     RADIOLOGY WITH ANESTHESIA Left 02/14/2023   Procedure: IR WITH ANESTHESIA;  Surgeon: Radiologist, Medication, MD;  Location: [facility] OR;  Service: Radiology;  Laterality: Left;   RADIOLOGY WITH ANESTHESIA N/A 02/14/2023   Procedure: IR WITH ANESTHESIA;  Surgeon: Radiologist, Medication, MD;  Location: [facility] OR;  Service: Radiology;  Laterality: N/A;   No family history on file. Social History:  reports that he has quit smoking. He has never used smokeless tobacco. He reports that he does not drink alcohol and does not use drugs. Allergies: No Known Allergies Medications Prior to Admission  Medication Sig Dispense Refill   alendronate (FOSAMAX) 70 MG tablet Take 70 mg by mouth once a week. Take with a full glass of water on an empty stomach. mondays     allopurinol (ZYLOPRIM) 300 MG tablet Take 300 mg by mouth every morning.     aspirin 325 MG tablet Take 1 tablet (325 mg total) by mouth daily. 30 tablet  5   atorvastatin (LIPITOR) 80 MG tablet Take 80 mg by mouth daily.     Calcium Carb-Cholecalciferol (CALCIUM 600 + D PO) Take 1 tablet by mouth in the morning and at bedtime.     empagliflozin (JARDIANCE) 25 MG TABS tablet Take 12.5 mg by mouth daily.     latanoprost (XALATAN) 0.005 % ophthalmic solution Place 1 drop into the right eye at bedtime.     levothyroxine (SYNTHROID) 88 MCG tablet Take 88 mcg by mouth daily before breakfast.      meloxicam (MOBIC) 7.5 MG tablet Take 7.5 mg by mouth daily.     metoprolol succinate (TOPROL-XL) 100 MG 24 hr tablet Take 100 mg by mouth daily. Take with or immediately following a meal.     mirabegron ER (MYRBETRIQ) 50 MG TB24 tablet Take 50 mg by mouth daily.     spironolactone (ALDACTONE) 25 MG tablet Take 25 mg by mouth daily.     venlafaxine XR (EFFEXOR-XR) 150 MG 24 hr capsule Take 150 mg by mouth every evening.      Home: Home Living Family/patient expects to be discharged to:: Private residence Living Arrangements: Spouse/significant other Available Help at Discharge: Family, Available PRN/intermittently (wife works as Lawyer but per pt broither could come stay with him if needed during the day) Type of Home: House Home Access: Stairs to enter [employer] of Steps: 1 Home Layout: Two level, Able to live on main level with bedroom/bathroom, Laundry or work area in basement Alternate Teacher, music of Steps: wood stove in basement Bathroom Shower/Tub: Engineer, manufacturing systems: Standard Home Equipment: Information systems manager, Medical laboratory scientific officer - quad, Hand held shower head  Lives With: Spouse  Functional History: Prior Function Prior Level of Function : Independent/Modified Independent Mobility Comments: usses cane when he thinks about it ADLs Comments: Pt reports mod I for ADL and medication management. Functional Status:  Mobility: Bed Mobility Overal bed mobility: Needs Assistance Bed Mobility: Sidelying to Sit, Rolling Rolling: Used rails, Mod assist Sidelying to sit: Used rails, Max assist, +2 for physical assistance General bed mobility comments: assist for legs off EOB and to lift trunk upright; scoot hips to EOB Transfers Overall transfer level: Needs assistance Equipment used: 2 person hand held assist Transfers: Sit to/from Stand, Bed to chair/wheelchair/BSC Sit to Stand: Max assist, +2 physical assistance Bed to/from chair/wheelchair/BSC transfer type:: Stand  pivot Stand pivot transfers: Max assist, +2 physical assistance General transfer comment: up to stand with heavy L lean initially, sat back and cues for head over R knee to stand with improved upright posture, then assist and cues for R lateral weight shift and A to move L foot then pt stepping over with R with support to L. Lowered to edge of chair and assist to scoot back Ambulation/Gait General Gait Details: unable    ADL: ADL Overall ADL's : Needs assistance/impaired Eating/Feeding: Moderate assistance, Sitting Grooming: Sitting, Maximal assistance Upper Body Bathing: Maximal assistance, Sitting Lower Body Bathing: Total assistance, +2 for physical assistance, +2 for safety/equipment, Sit to/from stand, Sitting/lateral leans Upper Body Dressing : Maximal assistance, Sitting Lower Body Dressing: Total assistance, +2 for safety/equipment, +2 for physical assistance Toilet Transfer: Maximal assistance, +2 for physical assistance, +2 for safety/equipment, Stand-pivot Functional mobility during ADLs: Maximal assistance, +2 for physical assistance, +2 for safety/equipment (SPT only) General ADL Comments: limited by attention, vision, L hemi  Cognition: Cognition Overall Cognitive Status: Impaired/Different from baseline Arousal/Alertness: Lethargic Orientation Level: Oriented to person, Disoriented to place, Disoriented to time, Disoriented  to situation Year: Other (Comment) (2005) Day of Week: Correct Attention: Sustained, Focused Focused Attention: Appears intact Sustained Attention: Impaired Sustained Attention Impairment: Verbal complex Memory: Impaired Memory Impairment: Decreased short term memory Decreased Short Term Memory: Verbal basic Awareness: Impaired Awareness Impairment: Emergent impairment Problem Solving: Impaired Problem Solving Impairment: Verbal complex Executive Function: Organizing Organizing: Impaired Organizing Impairment: Functional  complex Safety/Judgment: Appears intact Cognition Arousal: Alert Behavior During Therapy: WFL for tasks assessed/performed Overall Cognitive Status: Impaired/Different from baseline Area of Impairment: Attention, Safety/judgement Current Attention Level: Sustained Safety/Judgement: Decreased awareness of deficits, Decreased awareness of safety General Comments: cues for upright posture, pushes L feeling he is falling R when he is falling L and noted decreased L side awareness  Blood pressure (!) 167/106, pulse 85, temperature 99.5 F (37.5 C), temperature source Axillary, resp. rate (!) 22, height 5\' 9"  (1.753 m), weight 101.2 kg, SpO2 97%. Physical Exam Constitutional:      General: He is not in acute distress.    Appearance: He is obese.  HENT:     Right Ear: External ear normal.     Mouth/Throat:     Mouth: Mucous membranes are moist.  Eyes:     Extraocular Movements: Extraocular movements intact.     Conjunctiva/sclera: Conjunctivae normal.     Pupils: Pupils are equal, round, and reactive to light.  Cardiovascular:     Rate and Rhythm: Normal rate.  Pulmonary:     Effort: Pulmonary effort is normal.  Abdominal:     Palpations: Abdomen is soft.  Musculoskeletal:        General: No swelling or tenderness. Normal range of motion.     Cervical back: Normal range of motion.     Comments: Left heel cord a little tight  Skin:    General: Skin is warm.  Neurological:     Mental Status: He is alert.     Comments: Alert and oriented x 3. Normal insight and awareness. Intact Memory. Mild procesing deficits. Normal language and speech. Cranial nerve exam unremarkable except for mild central VII. MMT: LUE 1 to 1+ delt, bicep, tricep, wrist and HI. LLE 1/5 prox to distal. Sensory 1/2 LUE and LLE. DTR's 2++ on left, toes down, no resting tone. Marland Kitchen    Psychiatric:        Mood and Affect: Mood normal.        Behavior: Behavior normal.     Results for orders placed or performed during  the hospital encounter of 02/14/23 (from the past 24 hours)  Basic metabolic panel     Status: Abnormal   Collection Time: 02/16/23  4:38 AM  Result Value Ref Range   Sodium 136 135 - 145 mmol/L   Potassium 3.5 3.5 - 5.1 mmol/L   Chloride 102 98 - 111 mmol/L   CO2 22 22 - 32 mmol/L   Glucose, Bld 107 (H) 70 - 99 mg/dL   BUN 15 8 - 23 mg/dL   Creatinine, Ser 7.84 (H) 0.61 - 1.24 mg/dL   Calcium 9.3 8.9 - 69.6 mg/dL   GFR, Estimated 52 (L) >60 mL/min   Anion gap 12 5 - 15  CBC     Status: Abnormal   Collection Time: 02/16/23  4:38 AM  Result Value Ref Range   WBC 5.4 4.0 - 10.5 K/uL   RBC 4.55 4.22 - 5.81 MIL/uL   Hemoglobin 12.8 (L) 13.0 - 17.0 g/dL   HCT 29.5 28.4 - 13.2 %   MCV 87.9 80.0 -  100.0 fL   MCH 28.1 26.0 - 34.0 pg   MCHC 32.0 30.0 - 36.0 g/dL   RDW 13.2 44.0 - 10.2 %   Platelets 174 150 - 400 K/uL   nRBC 0.0 0.0 - 0.2 %   CT HEAD WO CONTRAST ( ) Result Date: 02/16/2023 CLINICAL DATA:  Stroke follow-up EXAM: CT HEAD WITHOUT CONTRAST TECHNIQUE: Contiguous axial images were obtained from the base of the skull through the vertex without intravenous contrast. RADIATION DOSE REDUCTION: This exam was performed according to the departmental dose-optimization program which includes automated exposure control, adjustment of the mA and/or kV according to patient size and/or use of iterative reconstruction technique. COMPARISON:  None Available. FINDINGS: Brain: Involving cytotoxic edema in the right thalamus and right occipital lobe at the site of known infarcts. 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 No acute hemorrhage. No midline shift or significant mass effect. Vascular: No hyperdense vessel or unexpected calcification. Skull: Normal. Negative for fracture or focal lesion. Sinuses/Orbits: No acute finding. Other: None. IMPRESSION: 1. Involving cytotoxic edema in the right thalamus and right occipital lobe at the site of known infarcts. No acute  hemorrhage. 2. Unchanged large left anterior para falcine calcified meningioma. Electronically Signed   By: Deatra Robinson M.D.   On: 02/16/2023 01:23   ECHOCARDIOGRAM COMPLETE Result Date: 02/15/2023    ECHOCARDIOGRAM REPORT   Patient Name:   Edward Rodgers Date of Exam: 02/15/2023 Medical Rec #:  [id number]      Height:       69.0 in Accession #:    [id number]     Weight:       223.1 lb Date of Birth:  1950/10/24      BSA:          2.164 m Patient Age:    72 years       BP:           120/71 mmHg Patient Gender: M              HR:           81 bpm. Exam Location:  Inpatient Procedure: 2D Echo, Cardiac Doppler, Color Doppler and Intracardiac            Opacification Agent Indications:    Stroke  History:        Patient has prior history of Echocardiogram examinations, most                 recent 03/30/2013. CHF; Stroke.  Sonographer:    Amy Chionchio Referring Phys: Erick Blinks IMPRESSIONS  1. No evidence of LV thrombus. Definity is administered. Left ventricular ejection fraction, by estimation, is 55%. The left ventricle has normal function. The left ventricle has no regional wall motion abnormalities. Left ventricular diastolic parameters are consistent with Grade I diastolic dysfunction (impaired relaxation).  2. Right ventricular systolic function was not well visualized. The right ventricular size is not well visualized. Tricuspid regurgitation signal is inadequate for assessing PA pressure.  3. The mitral valve is normal in structure. No evidence of mitral valve regurgitation. No evidence of mitral stenosis.  4. The aortic valve was not well visualized. Aortic valve regurgitation is not visualized. No aortic stenosis is present.  5. Aortic dilatation noted. There is borderline dilatation of the aortic root, measuring 36 mm. There is mild dilatation of the ascending aorta, measuring 40 mm. Comparison(s): No prior Echocardiogram. FINDINGS  Left Ventricle: No evidence of LV thrombus. Definity is  administered.  Left ventricular ejection fraction, by estimation, is 55%. The left ventricle has normal function. The left ventricle has no regional wall motion abnormalities. The left ventricular internal cavity size was normal in size. There is no left ventricular hypertrophy. Left ventricular diastolic parameters are consistent with Grade I diastolic dysfunction (impaired relaxation). Normal left ventricular filling pressure. Right Ventricle: The right ventricular size is not well visualized. Right vetricular wall thickness was not well visualized. Right ventricular systolic function was not well visualized. Tricuspid regurgitation signal is inadequate for assessing PA pressure. Left Atrium: Left atrial size was normal in size. Right Atrium: Right atrial size was normal in size. Pericardium: There is no evidence of pericardial effusion. Mitral Valve: The mitral valve is normal in structure. Mild mitral annular calcification. No evidence of mitral valve regurgitation. No evidence of mitral valve stenosis. MV peak gradient, 3.1 mmHg. The mean mitral valve gradient is 1.0 mmHg. Tricuspid Valve: The tricuspid valve is not well visualized. Tricuspid valve regurgitation is not demonstrated. No evidence of tricuspid stenosis. Aortic Valve: The aortic valve was not well visualized. Aortic valve regurgitation is not visualized. No aortic stenosis is present. Aortic valve mean gradient measures 5.0 mmHg. Aortic valve peak gradient measures 8.5 mmHg. Aortic valve area, by VTI measures 1.87 cm. Pulmonic Valve: The pulmonic valve was not well visualized. Pulmonic valve regurgitation is not visualized. No evidence of pulmonic stenosis. Aorta: Aortic dilatation noted. There is borderline dilatation of the aortic root, measuring 36 mm. There is mild dilatation of the ascending aorta, measuring 40 mm. Venous: The inferior vena cava was not well visualized. IAS/Shunts: No atrial level shunt detected by color flow Doppler.  LEFT  VENTRICLE PLAX 2D LVIDd:         5.10 cm      Diastology LVIDs:         4.10 cm      LV e' medial:    5.33 cm/s LV PW:         1.00 cm      LV E/e' medial:  6.7 LV IVS:        1.00 cm      LV e' lateral:   4.68 cm/s LVOT diam:     2.10 cm      LV E/e' lateral: 7.6 LV SV:         46 LV SV Index:   21 LVOT Area:     3.46 cm  LV Volumes (MOD) LV vol d, MOD A4C: 198.0 ml LV vol s, MOD A4C: 98.5 ml LV SV MOD A4C:     198.0 ml RIGHT VENTRICLE TAPSE (M-mode): 2.1 cm LEFT ATRIUM           Index        RIGHT ATRIUM           Index LA Vol (A4C): 56.0 ml 25.87 ml/m  RA Area:     11.10 cm                                    RA Volume:   20.60 ml  9.52 ml/m  AORTIC VALVE AV Area (Vmax):    1.99 cm AV Area (Vmean):   1.86 cm AV Area (VTI):     1.87 cm AV Vmax:           146.00 cm/s AV Vmean:          109.000 cm/s AV VTI:  0.244 m AV Peak Grad:      8.5 mmHg AV Mean Grad:      5.0 mmHg LVOT Vmax:         83.80 cm/s LVOT Vmean:        58.400 cm/s LVOT VTI:          0.132 m LVOT/AV VTI ratio: 0.54  AORTA Ao Root diam: 3.60 cm Ao Asc diam:  4.00 cm MITRAL VALVE MV Area (PHT): 5.66 cm    SHUNTS MV Area VTI:   3.22 cm    Systemic VTI:  0.13 m MV Peak grad:  3.1 mmHg    Systemic Diam: 2.10 cm MV Mean grad:  1.0 mmHg MV Vmax:       0.88 m/s MV Vmean:      48.6 cm/s MV Decel Time: 134 msec MV E velocity: 35.70 cm/s MV A velocity: 76.10 cm/s MV E/A ratio:  0.47 Vishnu Priya Mallipeddi Electronically signed by Winfield Rast Mallipeddi Signature Date/Time: 02/15/2023/2:14:49 PM    Final    MR BRAIN WO CONTRAST Result Date: 02/15/2023 CLINICAL DATA:  Stroke follow-up. Status post revascularization of right P2 occlusion. EXAM: MRI HEAD WITHOUT CONTRAST TECHNIQUE: Multiplanar, multiecho pulse sequences of the brain and surrounding structures were obtained without intravenous contrast. COMPARISON:  Head CT and MRI 02/14/2023 FINDINGS: The patient was unable to tolerate the complete examination which was terminated prematurely.  Axial and coronal diffusion, axial T2, axial FLAIR, and sagittal T1 sequences were obtained and are mildly to moderately motion degraded. Brain: Restricted diffusion is again seen within the mesial right temporal lobe and right thalamus, mildly progressed from the prior MRI and with new hypointense signal centrally within the right thalamic infarct being suspicious for a small amount of hemorrhage (with assessment limited on this incomplete examination which did not include susceptibility weighted or gradient echo imaging). Patchy restricted diffusion in the right periatrial white matter and in the region of the posterior body and tail of the caudate nucleus is largely new, and there is also a new 2 cm focus of restricted diffusion in the right occipital lobe. 2 new punctate acute infarcts are present in the left occipital lobe. Patchy T2 hyperintensities elsewhere in the cerebral white matter bilaterally are unchanged and nonspecific but compatible with mild chronic small vessel ischemic disease. 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 There is no midline shift or extra-axial fluid collection. There is mild generalized cerebral atrophy. Small chronic right cerebellar infarcts are again noted. Vascular: Major intracranial vascular flow voids are preserved. Skull and upper cervical spine: No suspicious marrow lesion. Advanced left facet arthrosis at C3-4 and C4-5. Sinuses/Orbits: Right cataract extraction. No evidence of significant inflammation in the paranasal sinuses. Clear mastoid air cells. Other: None. IMPRESSION: 1. Incomplete, motion degraded examination. 2. Progressive acute right PCA territory infarct with extension most notable in the right periatrial white matter and occipital lobe. Suspected subcentimeter hemorrhage centrally in the right thalamic infarct. 3. New punctate acute infarcts in the left occipital lobe. 4. Mild chronic small vessel ischemic disease. 5. Known left anterior  parafalcine meningioma. Electronically Signed   By: Sebastian Ache M.D.   On: 02/15/2023 10:35    Assessment/Plan: Diagnosis: 73 year old male with right PCA scattered infarcts involving the right periatrial white matter and occipital lobe as well as the right thalamus and left occipital lobe. Does the need for close, 24 hr/day medical supervision in concert with the patient's rehab needs make it unreasonable for this patient to be  served in a less intensive setting? Yes Co-Morbidities requiring supervision/potential complications:  -Congestive heart failure -History of prior right PICA infarct -History of CHF -Hypertension -Acute kidney injury -Delirium Due to bladder management, bowel management, safety, skin/wound care, disease management, medication administration, and patient education, does the patient require 24 hr/day rehab nursing? Yes Does the patient require coordinated care of a physician, rehab nurse, therapy disciplines of PT, OT, SLP to address physical and functional deficits in the context of the above medical diagnosis(es)? Yes Addressing deficits in the following areas: balance, endurance, locomotion, strength, transferring, bowel/bladder control, bathing, dressing, feeding, grooming, toileting, cognition, and psychosocial support Can the patient actively participate in an intensive therapy program of at least 3 hrs of therapy per day at least 5 days per week? Yes The potential for patient to make measurable gains while on inpatient rehab is excellent Anticipated functional outcomes upon discharge from inpatient rehab are supervision and min assist  with PT, supervision and min assist with OT, modified independent with SLP. Estimated rehab length of stay to reach the above functional goals is: 21-25 days Anticipated discharge destination: Home Overall Rehab/Functional Prognosis: excellent  POST ACUTE RECOMMENDATIONS: This patient's condition is appropriate for continued  rehabilitative care in the following setting: CIR Patient has agreed to participate in recommended program. Yes Note that insurance prior authorization may be required for reimbursement for recommended care.  Comment: Pt is motivated to regain functional mobility and use of his left side. Wife is supportive. Rehab Admissions Coordinator to follow up.     MEDICAL RECOMMENDATIONS: Will order PRAFO LLE to stretch heel cord.     I have personally performed a face to face diagnostic evaluation of this patient. Additionally, I have examined the patient's medical record including any pertinent labs and radiographic images. If the physician assistant has documented in this note, I have reviewed and edited or otherwise concur with the physician assistant's documentation.  Thanks,  Ranelle Oyster, MD 02/16/2023

## 2023-02-16 NOTE — Progress Notes (Addendum)
Inpatient Rehab Coordinator Note:  I met with patient and son, Harriett Sine at bedside to discuss CIR recommendations and goals/expectations of CIR stay.  We reviewed 3 hrs/day of therapy, physician follow up, and average length of stay 2 weeks (dependent upon progress) with goals of Min A. Son reports he lives with spouse and family will be able to provide assistance as needed. Will submit information to insurance for prior auth once consult completed. Continue to follow for potential admission to CIR.     Rehab Admissons Coordinator [place], [place], [place] [phone]

## 2023-02-16 NOTE — Progress Notes (Addendum)
STROKE TEAM PROGRESS NOTE   INTERIM HISTORY/SUBJECTIVE Brother at bedside.  Patient lying bed, confused, agitated overnight. Tired today.  PT and OT recommend CIR, insurance auth submitted. Seroquel and ativan needed overnight. Trial restraints off today. Transfer out of ICU Transfer out of ICU  OBJECTIVE  CBC    Component Value Date/Time   WBC 5.4 02/16/2023 0438   RBC 4.55 02/16/2023 0438   HGB 12.8 (L) 02/16/2023 0438   HCT 40.0 02/16/2023 0438   PLT 174 02/16/2023 0438   MCV 87.9 02/16/2023 0438   MCH 28.1 02/16/2023 0438   MCHC 32.0 02/16/2023 0438   RDW 14.5 02/16/2023 0438   LYMPHSABS 1.2 02/14/2023 0406   MONOABS 0.7 02/14/2023 0406   EOSABS 0.2 02/14/2023 0406   BASOSABS 0.0 02/14/2023 0406    BMET    Component Value Date/Time   NA 136 02/16/2023 0438   K 3.5 02/16/2023 0438   CL 102 02/16/2023 0438   CO2 22 02/16/2023 0438   GLUCOSE 107 (H) 02/16/2023 0438   BUN 15 02/16/2023 0438   CREATININE 1.44 (H) 02/16/2023 0438   CALCIUM 9.3 02/16/2023 0438   GFRNONAA 52 (L) 02/16/2023 0438    IMAGING past 24 hours CT HEAD WO CONTRAST ( ) Result Date: 02/16/2023 CLINICAL DATA:  Stroke follow-up EXAM: CT HEAD WITHOUT CONTRAST TECHNIQUE: Contiguous axial images were obtained from the base of the skull through the vertex without intravenous contrast. RADIATION DOSE REDUCTION: This exam was performed according to the departmental dose-optimization program which includes automated exposure control, adjustment of the mA and/or kV according to patient size and/or use of iterative reconstruction technique. COMPARISON:  None Available. FINDINGS: Brain: Involving cytotoxic edema in the right thalamus and right occipital lobe at the site of known infarcts. 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 No acute hemorrhage. No midline shift or significant mass effect. Vascular: No hyperdense vessel or unexpected calcification. Skull: Normal. Negative for fracture  or focal lesion. Sinuses/Orbits: No acute finding. Other: None. IMPRESSION: 1. Involving cytotoxic edema in the right thalamus and right occipital lobe at the site of known infarcts. No acute hemorrhage. 2. Unchanged large left anterior para falcine calcified meningioma. Electronically Signed   By: Deatra Robinson M.D.   On: 02/16/2023 01:23   ECHOCARDIOGRAM COMPLETE Result Date: 02/15/2023    ECHOCARDIOGRAM REPORT   Patient Name:   Edward Rodgers Date of Exam: 02/15/2023 Medical Rec #:  [id number]      Height:       69.0 in Accession #:    [id number]     Weight:       223.1 lb Date of Birth:  [birth year]      BSA:          2.164 m Patient Age:    73 years       BP:           120/71 mmHg Patient Gender: M              HR:           81 bpm. Exam Location:  Inpatient Procedure: 2D Echo, Cardiac Doppler, Color Doppler and Intracardiac            Opacification Agent Indications:    Stroke  History:        Patient has prior history of Echocardiogram examinations, most                 recent 03/30/2013. CHF; Stroke.  Sonographer:  Amy Chionchio Referring Phys: Erick Blinks IMPRESSIONS  1. No evidence of LV thrombus. Definity is administered. Left ventricular ejection fraction, by estimation, is 55%. The left ventricle has normal function. The left ventricle has no regional wall motion abnormalities. Left ventricular diastolic parameters are consistent with Grade I diastolic dysfunction (impaired relaxation).  2. Right ventricular systolic function was not well visualized. The right ventricular size is not well visualized. Tricuspid regurgitation signal is inadequate for assessing PA pressure.  3. The mitral valve is normal in structure. No evidence of mitral valve regurgitation. No evidence of mitral stenosis.  4. The aortic valve was not well visualized. Aortic valve regurgitation is not visualized. No aortic stenosis is present.  5. Aortic dilatation noted. There is borderline dilatation of the aortic root, measuring  36 mm. There is mild dilatation of the ascending aorta, measuring 40 mm. Comparison(s): No prior Echocardiogram. FINDINGS  Left Ventricle: No evidence of LV thrombus. Definity is administered. Left ventricular ejection fraction, by estimation, is 55%. The left ventricle has normal function. The left ventricle has no regional wall motion abnormalities. The left ventricular internal cavity size was normal in size. There is no left ventricular hypertrophy. Left ventricular diastolic parameters are consistent with Grade I diastolic dysfunction (impaired relaxation). Normal left ventricular filling pressure. Right Ventricle: The right ventricular size is not well visualized. Right vetricular wall thickness was not well visualized. Right ventricular systolic function was not well visualized. Tricuspid regurgitation signal is inadequate for assessing PA pressure. Left Atrium: Left atrial size was normal in size. Right Atrium: Right atrial size was normal in size. Pericardium: There is no evidence of pericardial effusion. Mitral Valve: The mitral valve is normal in structure. Mild mitral annular calcification. No evidence of mitral valve regurgitation. No evidence of mitral valve stenosis. MV peak gradient, 3.1 mmHg. The mean mitral valve gradient is 1.0 mmHg. Tricuspid Valve: The tricuspid valve is not well visualized. Tricuspid valve regurgitation is not demonstrated. No evidence of tricuspid stenosis. Aortic Valve: The aortic valve was not well visualized. Aortic valve regurgitation is not visualized. No aortic stenosis is present. Aortic valve mean gradient measures 5.0 mmHg. Aortic valve peak gradient measures 8.5 mmHg. Aortic valve area, by VTI measures 1.87 cm. Pulmonic Valve: The pulmonic valve was not well visualized. Pulmonic valve regurgitation is not visualized. No evidence of pulmonic stenosis. Aorta: Aortic dilatation noted. There is borderline dilatation of the aortic root, measuring 36 mm. There is mild  dilatation of the ascending aorta, measuring 40 mm. Venous: The inferior vena cava was not well visualized. IAS/Shunts: No atrial level shunt detected by color flow Doppler.  LEFT VENTRICLE PLAX 2D LVIDd:         5.10 cm      Diastology LVIDs:         4.10 cm      LV e' medial:    5.33 cm/s LV PW:         1.00 cm      LV E/e' medial:  6.7 LV IVS:        1.00 cm      LV e' lateral:   4.68 cm/s LVOT diam:     2.10 cm      LV E/e' lateral: 7.6 LV SV:         46 LV SV Index:   21 LVOT Area:     3.46 cm  LV Volumes (MOD) LV vol d, MOD A4C: 198.0 ml LV vol s, MOD A4C: 98.5 ml LV  SV MOD A4C:     198.0 ml RIGHT VENTRICLE TAPSE (M-mode): 2.1 cm LEFT ATRIUM           Index        RIGHT ATRIUM           Index LA Vol (A4C): 56.0 ml 25.87 ml/m  RA Area:     11.10 cm                                    RA Volume:   20.60 ml  9.52 ml/m  AORTIC VALVE AV Area (Vmax):    1.99 cm AV Area (Vmean):   1.86 cm AV Area (VTI):     1.87 cm AV Vmax:           146.00 cm/s AV Vmean:          109.000 cm/s AV VTI:            0.244 m AV Peak Grad:      8.5 mmHg AV Mean Grad:      5.0 mmHg LVOT Vmax:         83.80 cm/s LVOT Vmean:        58.400 cm/s LVOT VTI:          0.132 m LVOT/AV VTI ratio: 0.54  AORTA Ao Root diam: 3.60 cm Ao Asc diam:  4.00 cm MITRAL VALVE MV Area (PHT): 5.66 cm    SHUNTS MV Area VTI:   3.22 cm    Systemic VTI:  0.13 m MV Peak grad:  3.1 mmHg    Systemic Diam: 2.10 cm MV Mean grad:  1.0 mmHg MV Vmax:       0.88 m/s MV Vmean:      48.6 cm/s MV Decel Time: 134 msec MV E velocity: 35.70 cm/s MV A velocity: 76.10 cm/s MV E/A ratio:  0.47 Vishnu Priya Mallipeddi Electronically signed by Winfield Rast Mallipeddi Signature Date/Time: 02/15/2023/2:14:49 PM    Final     Vitals:   02/16/23 0715 02/16/23 0800 02/16/23 0900 02/16/23 1000  BP: (!) 138/90 (!) 180/91 (!) 146/94 (!) 164/86  Pulse: 80 99 81 86  Resp: 19 (!) 25 18 10   Temp:  97.9 F (36.6 C)    TempSrc:  Axillary    SpO2: 91% 97% 93% 97%  Weight:      Height:          PHYSICAL EXAM General:  Alert, well-nourished, well-developed patient in no acute distress Psych:  Mood and affect appropriate for situation CV: Regular rate and rhythm on monitor Respiratory:  Regular, unlabored respirations on room air GI: Abdomen soft and nontender  Neuro - awake, alert, eyes open, orientated to age, place, time and people.  Agitated overnight. impulsive No aphasia, fluent language, following all simple commands. Able to name and repeat. No gaze palsy, tracking bilaterally, visual field full. No significant facial droop. Tongue midline.  Left upper extremity 2+/5, finger movement 3/5.  Left lower extremity proximal 3 -/5, toe movements 3/5.  Diminished left light touch sensation.  Right FTN intact, gait not tested.    ASSESSMENT/PLAN Edward Rodgers is a 73 y.o. male withPMH significant for HTN, prior cerebellar stroke, hypothyroidism who was BIB EMS as a CODE STROKE 2/1 early AM due to left-sided weakness, numbness, dysconjugate gaze, slight R gaze preference and inconsistent left visual field testing. Per EMS, wife stated his LKW was around  0115.  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 Thrombectomy was discussed by neurologist Dr. Derry Lory, IR Dr. Corliss Skains and family who consented. However, patient improved from an NIH of 11 to an NIH of 3 en route to IR suite and it was felt that deficits were too small to intervene, but appropriate for TNKase.  STAT MRI showed diffusion restriction in R medial temporal with no signal change on FLAIR. At that time, NIH was fluctuating from 3-5 due to L arm and leg weakness fluctuating in intensity.   Stroke:  right PCA scattered infarct with thalamic HT and punctate left PCA infarcts with R P2 occlusion s/p TNK and IR are with TICI3 repeat fusion, etiology: Concerning for cardioembolic source Code Stroke CT head - No acute abnormality.  3 cm anterior left parafalcine meningioma,  enlarged from 2015. CTA head & neck w/Perfusion Right P2 occlusion. Extensive intracranial atherosclerosis at bilateral ICA bulbs and siphons. CTP 0/25cc  MRI Diffusion hyperintensity in the right PCA distribution affecting the medial temporal lobe and thalamus. FLAIR hyperintensity is not yet detected. left anterior parafalcine meningioma. NIHSS 11->3->7 Repeat CTH Subtle but likely cytotoxic edema now seen at areas of right temporal and thalamic diffusion abnormality Repeat CT Perfusion 0/36 S/p IR with right distal P2 occlusion and TICI3 reperfusion MRI Progressive acute right PCA territory infarct with extension most notable in the right periatrial white matter and occipital lobe. Suspected subcentimeter hemorrhage centrally in the right thalamic infarct. New punctate acute infarcts in the left occipital lobe. 2/3 CT repeat  Involving cytotoxic edema in the right thalamus and right occipital lobe at the site of known infarcts. No acute hemorrhage. Unchanged large left anterior para falcine calcified meningioma. 2D Echo EF 55%, no LV thrombus LE venous Doppler pending Will consider loop recorder to rule out A-fib LDL 30 HgbA1c 6.4 UDS negative VTE prophylaxis -Lovenox aspirin 325 mg daily prior to admission, now on ASA 81 and plavix DAPT. Therapy recommendations: CIR Disposition:  pending  History of stroke 03/2013 right PICA small infarct, EF 35 to 40%, LDL 112.  Discharged on aspirin 81 and statin.  History of CHF 03/2013 2D echo EF 35 to 40% Home medication including metoprolol, spironolactone 2D echo this admission EF 55% Resumed home metoprolol and spironolactone Will consider loop recorder to rule out A-fib  Hypertension Home meds: Spironolactone 25, toprol-xl 100mg  Cleviprex gtt off Resumed home spironolactone and metoprolol Add amlodipine 10 Labetalol IV PRN BP goal less than 160 given small HT Monitor BP goal normotensive  Hyperlipidemia Home meds: Lipitor 80 LDL  30, goal < 70 Resume home Lipitor 80 Consider statin on discharge  AKI creatinine 1.20--1.30--0.96 --1.44 Likely dehydration, less likely due to spironolactone On IVF @ 50 BMP monitoring  Sundowning Delirium  Likely mild baseline cognitive impairment Resume home Effexor Increase seroquel HS from 25 to 50mg   Other Stroke Risk Factors Obesity, Body mass index is 32.95 kg/m., BMI >/= 30 associated with increased stroke risk, recommend weight loss, diet and exercise as appropriate  Former smoker  Other acute issues Gout, resume allopurinol Stable left frontal meningioma  Hospital day # 2   Patient seen and examined by NP/APP with MD. MD to update note as needed.   Elmer Picker, DNP, FNP-BC [facility] Pager: [phone]  ATTENDING NOTE: I reviewed above note and agree with the assessment and plan. Pt was seen and examined.   Brother at bedside.  Patient last night had agitation, delirium,  received Ativan.  This morning drowsy sleepy but able to open eyes on voice, orientated to age, place and people, orientated to month but not to year, mild perseveration.  No aphasia, paucity of speech but still fluent, able to name and repeat, follows some commands.  No aphasia, no gaze palsy, facial symmetric.  Left upper extremity proximal 1/5, finger movement 2/5.  Left lower extremity proximal 2/5, toe movement 2/5.  Sensation symmetrical bilaterally.  Right finger-to-nose intact.  Head CT overnight showed evolving infarcts and no hemorrhage.  On aspirin 81, started Plavix today for DAPT.  Continue statin.  Increased Seroquel from 25-50.  Creatinine elevated, concern for AKI, start IV fluid and continue monitoring BMP.  Patient stroke concerns were cardioembolic source, LE venous Doppler pending.  Will need loop recorder before discharge.  PT and OT recommend CIR.  For detailed assessment and plan, please refer to above/below as I have made changes wherever appropriate.    Marvel Plan, MD PhD Stroke Neurology 02/16/2023 1:15 PM  This patient is critically ill due to right PCA and left PCA stroke, right P2 occlusion status post thrombectomy, CHF and delirium, and at significant risk of neurological worsening, death form recurrent stroke, hemorrhagic transmission, heart failure. This patient's care requires constant monitoring of vital signs, hemodynamics, respiratory and cardiac monitoring, review of multiple databases, neurological assessment, discussion with family, other specialists and medical decision making of high complexity. I spent 35 minutes of neurocritical care time in the care of this patient. I had long discussion with brother at bedside, updated pt current condition, treatment plan and potential prognosis, and answered all the questions. He expressed understanding and appreciation.   To contact Stroke Continuity provider, please refer to [email or web address]. After hours, contact General Neurology

## 2023-02-16 NOTE — Progress Notes (Signed)
Physical Therapy Treatment Patient Details Name: Edward Rodgers MRN: [id number] DOB: [birth year] Today's Date: 02/16/2023   History of Present Illness Patient is a 73 y/o male admitted 02/14/23 due to L side weakness, numbness and dysconjugate gaze, R gaze preference and inconsistent L visual field testing.  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 Intermittent improvements and worsening in NIHSS and noted increase in prenumbra to 36 so decided to take for thrombectomy R PCA distal P2 segment.  PMH positive for CHF, gout, HTN, thyroid disease, Prostate CA and prior CVA with residual diplopia.    PT Comments  Pt reports not sleeping well last night and is tired, but is agreeable to PT session. PT initial focus of session was on sitting balance, pt aided by use of mirror feedback to correct posture from L lateral leaning with pushing to upright. Pt tolerating repeated transfers into standing with max +1 assist, pt most comfortable with weight shifted to RLE and when placed more on LLE pt with hyperextension for stability. Pt transferred to[facility]from [facility] as pt expressing need for BM, tolerated squat pivot transfer for this well. Plan remains appropriate.     If plan is discharge home, recommend the following: Two people to help with walking and/or transfers;Two people to help with bathing/dressing/bathroom;Help with stairs or ramp for entrance;Assist for transportation;Direct supervision/assist for financial management;Assistance with cooking/housework;Supervision due to cognitive status   Can travel by private vehicle        Equipment Recommendations  None recommended by PT    Recommendations for Other Services       Precautions / Restrictions Precautions Precautions: Fall Precaution Comments: pusher L; glasses, some L lower field cut (pt endorses double vision even prior to CVA) Restrictions Weight Bearing Restrictions Per Provider Order: No     Mobility  Bed  Mobility Overal bed mobility: Needs Assistance Bed Mobility: Supine to Sit, Sit to Supine     Supine to sit: Max assist Sit to supine: Max assist   General bed mobility comments: assist for trunk elevation/lowering, LLE progression to/from EOB, bringing LUE into lap when moving to EOB, scooting to EOB with assist of bed pad.    Transfers Overall transfer level: Needs assistance Equipment used: 1 person hand held assist Transfers: Sit to/from Stand, Bed to chair/wheelchair/BSC Sit to Stand: Max assist     Squat pivot transfers: Max assist     General transfer comment: max assist for power up, rise, steadying. Stand x3, from EOB x2 and BSC x1 with initial stand pt placing majority of his weight on RLE and subsequent stands pt locking out LLE into extension to prevent instability. pt got on [facility] initially because RN student moved bed back and moved BSC under pt, pt got off BSC via squat pivot towards R into bed.    Ambulation/Gait               General Gait Details: unable   Stairs             Wheelchair Mobility     Tilt Bed    Modified Rankin (Stroke Patients Only) Modified Rankin (Stroke Patients Only) Pre-Morbid Rankin Score: No significant disability Modified Rankin: Severe disability     Balance Overall balance assessment: Needs assistance Sitting-balance support: Single extremity supported, Feet supported Sitting balance-Leahy Scale: Fair Sitting balance - Comments: initially poor, but improving to sitting unsupported with feedback of mirror to correct posture to upright. mod cuing for placing LUE in  lap to prevent pushing Postural control: Left lateral lean Standing balance support: Bilateral upper extremity supported Standing balance-Leahy Scale: Zero Standing balance comment: max A                            Cognition Arousal: Alert Behavior During Therapy: WFL for tasks assessed/performed Overall Cognitive Status:  Impaired/Different from baseline Area of Impairment: Attention, Safety/judgement, Orientation                 Orientation Level: Disoriented to, Place Current Attention Level: Sustained     Safety/Judgement: Decreased awareness of deficits, Decreased awareness of safety     General Comments: pt confused about location, states he is in the doctors office, then the hospital, but when PT oriented pt to [place] pt asks "I'm still there?" requires step-by-step cuing for sequencing mobility, L inattention with leaving LUE behind when scooting towards EOB        Exercises      General Comments General comments (skin integrity, edema, etc.): 2LO2      Pertinent Vitals/Pain Pain Assessment Pain Assessment: No/denies pain    Home Living   Living Arrangements: Spouse/significant other Available Help at Discharge: Family;Available PRN/intermittently Type of Home: House Home Access: Stairs to enter Entrance Stairs-Rails: None Entrance Stairs-Number of Steps: 1 Alternate Level Stairs-Number of Steps: wood stove in basement Home Layout: Two level;Able to live on main level with bedroom/bathroom;Laundry or work area in basement        Prior Function            PT Goals (current goals can now be found in the care plan section) Acute Rehab PT Goals Patient Stated Goal: return home PT Goal Formulation: With patient Time For Goal Achievement: 03/01/23 Potential to Achieve Goals: Good Progress towards PT goals: Progressing toward goals    Frequency    Min 1X/week      PT Plan      Co-evaluation              AM-PAC PT "6 Clicks" Mobility   Outcome Measure  Help needed turning from your back to your side while in a flat bed without using bedrails?: A Lot Help needed moving from lying on your back to sitting on the side of a flat bed without using bedrails?: A Lot Help needed moving to and from a bed to a chair (including a wheelchair)?: Total Help needed  standing up from a chair using your arms (e.g., wheelchair or bedside chair)?: Total Help needed to walk in hospital room?: Total Help needed climbing 3-5 steps with a railing? : Total 6 Click Score: 8    End of Session Equipment Utilized During Treatment: Gait belt Activity Tolerance: Patient limited by fatigue Patient left: with call bell/phone within reach;in bed;with bed alarm set;with SCD's reapplied Nurse Communication: Mobility status PT Visit Diagnosis: Other abnormalities of gait and mobility (R26.89);Other symptoms and signs involving the nervous system (R29.898);Hemiplegia and hemiparesis Hemiplegia - Right/Left: Left Hemiplegia - dominant/non-dominant: Non-dominant Hemiplegia - caused by: Cerebral infarction     Time: 4696-2952 PT Time Calculation (min) (ACUTE ONLY): 34 min  Charges:    $Therapeutic Activity: 8-22 mins $Neuromuscular Re-education: 8-22 mins PT General Charges $$ ACUTE PT VISIT: 1 Visit                     Marye Round, PT DPT Acute Rehabilitation Services Secure Chat Preferred  Office [phone]  Laurelle Skiver E Stroup 02/16/2023, 4:45 PM

## 2023-02-16 NOTE — PMR Pre-admission (Signed)
 PMR Admission Coordinator Pre-Admission Assessment  Patient: Edward Rodgers is an 73 y.o., male MRN: [id number] DOB: 08/29/1950 Height: 5' 9 (175.3 cm) Weight: 101.2 kg              Insurance Information HMO: yes    PPO:      PCP:      IPA:      80/20:      OTHER:   PRIMARY: [employer] Medicare      Policy#: [id number]   ; Medicare # [id number]   Subscriber: patient CM Name: [name]      Phone#: [phone] option 3     Fax#: [phone] Pre-Cert#: [id number] approved 02/19/23 until 02/25/23       Employer:  Benefits:  Phone #: [phone]     Name: 2/3 Eff. Date: 01/14/23     Deduct: none      Out of Pocket Max: $3600      Life Max: none  CIR: $300 co pay per day days 1 until 5      SNF: no co pay per day days 1 until 20; $203 co pay per day days 21 until 100 Outpatient: $20 co pay per visit     Co-Pay: visits per medical neccesity Home Health: 100%      Co-Pay: visits per medical neccesity DME: 80%     Co-Pay: 20% Providers: in network  SECONDARY:       Policy#:       Phone#:   Artist:       Phone#:   The Data Processing Manager" for patients in Inpatient Rehabilitation Facilities with attached "Privacy Act Statement-Health Care Records" was provided and verbally reviewed with: Patient  Emergency Contact Information Contact Information     Name Relation Home Work Mobile   Pal,[name] [phone]  [phone]   [name], [name]   [phone]      Other Contacts     Name Relation Home Work Mobile   [name]   [phone]      Current Medical History  Patient Admitting Diagnosis: CVA  History of Present Illness: 74 year old right-handed male with history of diastolic congestive heart failure, gout, hypothyroidism, prior cerebellar infarct with residual diplopia maintained on full-strength aspirin , prostate cancer, tobacco use.  Presented to 02/02/2023 with left-sided weakness numbness and disconjugate gaze.   CT/MRI showed  diffusion hyperintensity in the right PCA distribution affecting the medial temporal lobe and thalamus.  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  CTA right P2 occlusion presumably acute in the setting.  Extensive intracranial atherosclerosis.  CT perfusion of the brain showed increased ischemic range perfusion on the right PCA distribution measuring 36 cc as compared to 25 cc on prior imaging.  Admission chemistries unremarkable except glucose 115 BUN 24, hemoglobin A1c 6.4.  Echocardiogram with ejection fraction of 55% no wall motion abnormalities grade 1 diastolic dysfunction.  Patient did receive TNK.  MRI follow-up revealed progressive acute right PCA territory infarct with extension most notable in the right periatrial white matter and occipital lobe.  There was suspected subcentimeter hemorrhage centrally in the right thalamic infarct.  New punctate infarct in the left occipital lobe also seen.  Neurology follow-up placed on low-dose aspirin  and Plavix  for CVA prophylaxis x 3 weeks then Plavix  alone.  Subcutaneous heparin  for DVT prophylaxis.  Await plan for loop recorder on 02/20/23.  Mild AKI 1.20-1.44 placed on gentle IV fluids since discontinued with latest creatinine 1.23.   Complete NIHSS TOTAL: 13  Glasgow Coma Scale Score: 15  Patient's medical record from [facility] has been reviewed by the rehabilitation admission coordinator and physician.  Past Medical History  Past Medical History:  Diagnosis Date   Cancer (HCC) 2002   prostate   CHF (congestive heart failure) (HCC)    Gout    Hypertension    Thyroid  disease    Has the patient had major surgery during 100 days prior to admission? No  Family History  family history is not on file.  Current Medications   Current Facility-Administered Medications:    acetaminophen  (TYLENOL ) tablet 650 mg, 650 mg, Oral, Q4H PRN, 650 mg at 02/20/23 0948 **OR** acetaminophen  (TYLENOL ) 160 MG/5ML solution 650  mg, 650 mg, Per Tube, Q4H PRN **OR** acetaminophen  (TYLENOL ) suppository 650 mg, 650 mg, Rectal, Q4H PRN, Khaliqdina, Salman, MD   allopurinol  (ZYLOPRIM ) tablet 300 mg, 300 mg, Oral, q morning, Jerri Pfeiffer, MD, 300 mg at 02/20/23 9051   amLODipine  (NORVASC ) tablet 10 mg, 10 mg, Oral, Daily, Jerri Pfeiffer, MD, 10 mg at 02/20/23 9050   aspirin  EC tablet 81 mg, 81 mg, Oral, Daily, Jerri Pfeiffer, MD, 81 mg at 02/20/23 9051   atorvastatin  (LIPITOR ) tablet 80 mg, 80 mg, Oral, Daily, Lehner, Erin C, NP, 80 mg at 02/20/23 9051   calcium -vitamin D (OSCAL WITH D) 500-5 MG-MCG per tablet 1 tablet, 1 tablet, Oral, Daily, Khaliqdina, Salman, MD, 1 tablet at 02/20/23 0948   Chlorhexidine  Gluconate Cloth 2 % PADS 6 each, 6 each, Topical, Daily, Khaliqdina, Salman, MD, 6 each at 02/19/23 0931   clopidogrel  (PLAVIX ) tablet 75 mg, 75 mg, Oral, Daily, Jerri Pfeiffer, MD, 75 mg at 02/20/23 0949   feeding supplement (ENSURE ENLIVE / ENSURE PLUS) liquid 237 mL, 237 mL, Oral, BID BM, Shafer, Devon, NP, 237 mL at 02/20/23 0952   heparin  injection 5,000 Units, 5,000 Units, Subcutaneous, Q8H, Jerri Pfeiffer, MD, 5,000 Units at 02/20/23 0510   labetalol  (NORMODYNE ) injection 10 mg, 10 mg, Intravenous, Q1H PRN, Lehner, Erin C, NP, 10 mg at 02/16/23 1216   latanoprost  (XALATAN ) 0.005 % ophthalmic solution 1 drop, 1 drop, Right Eye, QHS, Khaliqdina, Salman, MD, 1 drop at 02/19/23 2139   levothyroxine  (SYNTHROID ) tablet 88 mcg, 88 mcg, Oral, QAC breakfast, Tucker, Mary-Ashlyn, RPH, 88 mcg at 02/20/23 0510   lidocaine  (LIDODERM ) 5 % 1 patch, 1 patch, Transdermal, Q24H, Shafer, Devon, NP, 1 patch at 02/20/23 1248   metoprolol  succinate (TOPROL -XL) 24 hr tablet 100 mg, 100 mg, Oral, Daily, Jerri Pfeiffer, MD, 100 mg at 02/20/23 9051   mirabegron  ER (MYRBETRIQ ) tablet 50 mg, 50 mg, Oral, Daily, Jerri Pfeiffer, MD, 50 mg at 02/20/23 0948   ondansetron  (ZOFRAN -ODT) disintegrating tablet 4 mg, 4 mg, Oral, Q8H PRN, de Clint Kill, Cortney E, NP, 4 mg at  02/19/23 0547   Oral care mouth rinse, 15 mL, Mouth Rinse, PRN, Khaliqdina, Salman, MD   polyethylene glycol (MIRALAX  / GLYCOLAX ) packet 17 g, 17 g, Oral, Daily, Jerri Pfeiffer, MD, 17 g at 02/20/23 9052   QUEtiapine  (SEROQUEL ) tablet 50 mg, 50 mg, Oral, QHS, Jerri Pfeiffer, MD, 50 mg at 02/19/23 1701   senna-docusate (Senokot-S) tablet 1 tablet, 1 tablet, Oral, QHS PRN, Khaliqdina, Salman, MD, 1 tablet at 02/18/23 0857   sodium chloride  (OCEAN) 0.65 % nasal spray 1 spray, 1 spray, Each Nare, PRN, de Clint Kill, Cortney E, NP   spironolactone  (ALDACTONE ) tablet 25 mg, 25 mg, Oral, Daily, Jerri Pfeiffer, MD, 25 mg at 02/20/23 0949   venlafaxine   XR (EFFEXOR -XR) 24 hr capsule 150 mg, 150 mg, Oral, QPM, Jerri Pfeiffer, MD, 150 mg at 02/19/23 1701  Patients Current Diet:  Diet Order             Diet regular Room service appropriate? Yes; Fluid consistency: Thin  Diet effective now                  Precautions / Restrictions Precautions Precautions: Fall Precaution Comments: pusher L; glasses, some L lower field cut (pt endorses double vision even prior to CVA) Restrictions Weight Bearing Restrictions Per Provider Order: No   Has the patient had 2 or more falls or a fall with injury in the past year?No  Prior Activity Level Community (5-7x/wk): Mod I with use of cane prn  Prior Functional Level Prior Function Prior Level of Function : Independent/Modified Independent Mobility Comments: usses cane when he thinks about it ADLs Comments: Pt reports mod I for ADL and medication management.  Self Care: Did the patient need help bathing, dressing, using the toilet or eating?  Independent  Indoor Mobility: Did the patient need assistance with walking from room to room (with or without device)? Independent  Stairs: Did the patient need assistance with internal or external stairs (with or without device)? Independent  Functional Cognition: Did the patient need help planning regular tasks such as  shopping or remembering to take medications? Independent  Patient Information Are you of Hispanic, Latino/a,or Spanish origin?: A. No, not of Hispanic, Latino/a, or Spanish origin What is your race?: B. Black or African American Do you need or want an interpreter to communicate with a doctor or health care staff?: 0. No  Patient's Response To:  Health Literacy and Transportation Is the patient able to respond to health literacy and transportation needs?: Yes Health Literacy - How often do you need to have someone help you when you read instructions, pamphlets, or other written material from your doctor or pharmacy?: Never In the past 12 months, has lack of transportation kept you from medical appointments or from getting medications?: No In the past 12 months, has lack of transportation kept you from meetings, work, or from getting things needed for daily living?: No  Journalist, Newspaper / Equipment Home Equipment: Shower seat, Medical Laboratory Scientific Officer - quad, Hand held shower head  Prior Device Use: Indicate devices/aids used by the patient prior to current illness, exacerbation or injury? None of the above  Current Functional Level Cognition  Arousal/Alertness: Lethargic Overall Cognitive Status: Impaired/Different from baseline Current Attention Level: Sustained Orientation Level: Oriented to person, Oriented to place, Oriented to situation Safety/Judgement: Decreased awareness of deficits, Decreased awareness of safety General Comments: Patient able to follow one step commands Attention: Sustained, Focused Focused Attention: Appears intact Sustained Attention: Impaired Sustained Attention Impairment: Verbal complex Memory: Impaired Memory Impairment: Decreased short term memory Decreased Short Term Memory: Verbal basic Awareness: Impaired Awareness Impairment: Emergent impairment Problem Solving: Impaired Problem Solving Impairment: Verbal complex Executive Function: Organizing Organizing:  Impaired Organizing Impairment: Functional complex Safety/Judgment: Appears intact    Extremity Assessment (includes Sensation/Coordination)  Upper Extremity Assessment: LUE deficits/detail LUE Deficits / Details: LUE with min increased tone in supine, able to initate movement with AAROM. Performed AAROM to 90 in supine x3 with no complaints of pain.  Patient has gross grassp intermittent with control and no extension noted.  Tone increased to moderate in sitting with pull into shoulder  internal rotation LUE Coordination: decreased fine motor, decreased gross motor  Lower Extremity Assessment: Defer to  PT evaluation LLE Deficits / Details: unable to activate without assist; flexion with stretch to extensors and pt able to extend the leg; no active ankle movement, noted some numbness compared to R LLE Sensation: decreased light touch, decreased proprioception LLE Coordination: decreased gross motor    ADLs  Overall ADL's : Needs assistance/impaired Eating/Feeding: Moderate assistance, Sitting Grooming: Sitting, Maximal assistance Upper Body Bathing: Maximal assistance, Sitting Lower Body Bathing: Total assistance, +2 for physical assistance, +2 for safety/equipment, Sit to/from stand, Sitting/lateral leans Upper Body Dressing : Maximal assistance, Sitting Lower Body Dressing: Total assistance, +2 for safety/equipment, +2 for physical assistance Toilet Transfer: Maximal assistance, +2 for physical assistance, +2 for safety/equipment, Stand-pivot Functional mobility during ADLs: +2 for physical assistance, Maximal assistance (Used steady for transfer from bed to chair) General ADL Comments: limited by attention, vision, L hemi    Mobility  Overal bed mobility: Needs Assistance Bed Mobility: Supine to Sit Rolling: Max assist (to pts left) Sidelying to sit: Max assist Supine to sit: Mod assist Sit to supine: Max assist General bed mobility comments: assist for trunk elevation, LLE  progression to/from EOB, bringing LUE into lap when moving to EOB, scooting to EOB with assist of bed pad.    Transfers  Overall transfer level: Needs assistance Equipment used: 1 person hand held assist Transfers: Sit to/from Stand, Bed to chair/wheelchair/BSC Sit to Stand: Max assist, From elevated surface Bed to/from chair/wheelchair/BSC transfer type:: Squat pivot Stand pivot transfers: Max assist Squat pivot transfers: Max assist Transfer via Lift Equipment: [employer] transfer comment: max A to stand  with cues to shift weight to R, max A to boost with step by step cues to sequence to pivot toward R to chair    Ambulation / Gait / Stairs / Wheelchair Mobility  Ambulation/Gait General Gait Details: unable    Posture / Balance Dynamic Sitting Balance Sitting balance - Comments: initially poor, but improving to sitting unsupported with feedback of mirror to correct posture to upright. mod cuing for placing LUE in lap to prevent pushing Balance Overall balance assessment: Needs assistance Sitting-balance support: Feet supported, Bilateral upper extremity supported Sitting balance-Leahy Scale: Poor Sitting balance - Comments: initially poor, but improving to sitting unsupported with feedback of mirror to correct posture to upright. mod cuing for placing LUE in lap to prevent pushing Postural control: Left lateral lean Standing balance support: Bilateral upper extremity supported Standing balance-Leahy Scale: Zero Standing balance comment: max A    Special needs/care consideration Hgb A1c 6.4 LOOP to be placed prior to admit to CIR Urinary incontinence at baseline s/p prostate cancer/Wears Depends    Previous Home Environment  Living Arrangements: Spouse/significant other  Lives With: Spouse Available Help at Discharge: Family, Available PRN/intermittently Type of Home: House Home Layout: Two level, Able to live on main level with bedroom/bathroom, Laundry or work area in  basement Alternate Level Stairs-Rails: Right Alternate Level Stairs-Number of Steps: wood stove in basement Home Access: Stairs to enter Entrance Stairs-Rails: None Secretary/administrator of Steps: 1 Bathroom Shower/Tub: Associate Professor: Yes How Accessible: Accessible via walker  Discharge Living Setting Plans for Discharge Living Setting: Patient's home, Lives with (comment) (spouse) Type of Home at Discharge: House Discharge Home Layout: Able to live on main level with bedroom/bathroom, Laundry or work area in basement Discharge Home Access: Stairs to enter Secretary/administrator of Steps: 1 Discharge Bathroom Shower/Tub: Tub/shower unit Discharge Bathroom Toilet: Standard Discharge Bathroom Accessibility: Yes How Accessible: Accessible via walker Does  the patient have any problems obtaining your medications?: No  Social/Family/Support Systems Anticipated Caregiver: Spouse, son Lowanda Anticipated Caregiver's Contact Information: Lowanda [phone], wife [phone] Caregiver Availability: 24/7 Discharge Plan Discussed with Primary Caregiver: Yes Is Caregiver In Agreement with Plan?: Yes Does Caregiver/Family have Issues with Lodging/Transportation while Pt is in Rehab?: No  Wife is a CNA and will take FMLA to provide his care as needed. 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  Goals Patient/Family Goal for Rehab: min A PT, OT, independent with SLP Expected length of stay: ELOS 21 to 25 days Pt/Family Agrees to Admission and willing to participate: Yes Program Orientation Provided & Reviewed with Pt/Caregiver Including Roles  & Responsibilities: Yes  Barriers to Discharge: Insurance for SNF coverage  Decrease burden of Care through IP rehab admission: n/a  Possible need for SNF placement upon discharge:not anticipated  Patient Condition: This patient's medical and functional status are the same  as the consult  dated 02/17/23 in which the Rehabilitation Physician determined and documented that the patient was potentially appropriate for intensive rehabilitative care in an inpatient rehabilitation facility.  Will admit to inpatient rehab on 02/21/23 when bed is available.   Preadmission Screen Completed By:  Alison Heron Lot, RN MSN, 02/20/2023 2:47 PM ______________________________________________________________________   Discussed status with Dr. Babs on 02/20/23 at 0900 and received approval for admission 02/21/23 when bed is available.  Admission Coordinator:  Alison Heron Lot, RN MSN time [id number] Date 02/20/23

## 2023-02-16 NOTE — Plan of Care (Signed)
  Problem: Nutrition: Goal: Risk of aspiration will decrease Outcome: Progressing Goal: Dietary intake will improve Outcome: Progressing   Problem: Clinical Measurements: Goal: Will remain free from infection Outcome: Progressing   Problem: Activity: Goal: Risk for activity intolerance will decrease Outcome: Progressing

## 2023-02-17 ENCOUNTER — Inpatient Hospital Stay (HOSPITAL_COMMUNITY): Payer: Medicare Other

## 2023-02-17 DIAGNOSIS — I63531 Cerebral infarction due to unspecified occlusion or stenosis of right posterior cerebral artery: Secondary | ICD-10-CM | POA: Diagnosis not present

## 2023-02-17 DIAGNOSIS — I639 Cerebral infarction, unspecified: Secondary | ICD-10-CM | POA: Diagnosis not present

## 2023-02-17 LAB — BASIC METABOLIC PANEL
Anion gap: 12 (ref 5–15)
BUN: 15 mg/dL (ref 8–23)
CO2: 24 mmol/L (ref 22–32)
Calcium: 9.2 mg/dL (ref 8.9–10.3)
Chloride: 103 mmol/L (ref 98–111)
Creatinine, Ser: 1.32 mg/dL — ABNORMAL HIGH (ref 0.61–1.24)
GFR, Estimated: 58 mL/min — ABNORMAL LOW (ref >60)
Glucose, Bld: 97 mg/dL (ref 70–99)
Potassium: 3.9 mmol/L (ref 3.5–5.1)
Sodium: 139 mmol/L (ref 135–145)

## 2023-02-17 LAB — CBC
HCT: 39.1 % (ref 39.0–52.0)
Hemoglobin: 12.6 g/dL — ABNORMAL LOW (ref 13.0–17.0)
MCH: 28.2 pg (ref 26.0–34.0)
MCHC: 32.2 g/dL (ref 30.0–36.0)
MCV: 87.5 fL (ref 80.0–100.0)
Platelets: 170 10*3/uL (ref 150–400)
RBC: 4.47 MIL/uL (ref 4.22–5.81)
RDW: 14.4 % (ref 11.5–15.5)
WBC: 4.7 10*3/uL (ref 4.0–10.5)
nRBC: 0 % (ref 0.0–0.2)

## 2023-02-17 NOTE — Progress Notes (Signed)
   Inpatient Rehabilitation Admissions Coordinator   I met at bedside with patient. I will begin Auth with [facility] medicare. Dr Riley Kill completed consult today.  Ottie Glazier, RN, MSN Rehab Admissions Coordinator [phone] 02/17/2023 11:56 AM

## 2023-02-17 NOTE — Plan of Care (Signed)
 Comofortable, on room air, not in distress. No complaint noted.  Problem: Ischemic Stroke/TIA Tissue Perfusion: Goal: Complications of ischemic stroke/TIA will be minimized Outcome: Progressing   Problem: Clinical Measurements: Goal: Will remain free from infection Outcome: Progressing   Problem: Skin Integrity: Goal: Risk for impaired skin integrity will decrease Outcome: Progressing   Problem: Clinical Measurements: Goal: Respiratory complications will improve Outcome: Progressing   Problem: Activity: Goal: Risk for activity intolerance will decrease Outcome: Progressing

## 2023-02-17 NOTE — Progress Notes (Signed)
 Orthopedic Tech Progress Note Patient Details:  Edward Rodgers Jun 12, 1950 [id number]  Ortho Devices Type of Ortho Device: Prafo boot/shoe Ortho Device/Splint Location: LLE Ortho Device/Splint Interventions: Ordered, Application, Adjustment   Post Interventions Patient Tolerated: Well Instructions Provided: Care of device  Delanna LITTIE Pac 02/17/2023, 11:01 AM

## 2023-02-17 NOTE — Progress Notes (Signed)
 Physical Therapy Treatment Patient Details Name: Edward Rodgers MRN: [id number] DOB: 07/25/1950 Today's Date: 02/17/2023   History of Present Illness Patient is a 73 y/o male admitted 02/14/23 due to L side weakness, numbness and dysconjugate gaze, R gaze preference and inconsistent L visual field testing.  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 Intermittent improvements and worsening in NIHSS and noted increase in prenumbra to 36 so decided to take for thrombectomy R PCA distal P2 segment.  PMH positive for CHF, gout, HTN, thyroid  disease, Prostate CA and prior CVA with residual diplopia.    PT Comments  Pt resting in bed on arrival and agreeable to session with continued progress towards acute goals. Pt continues to be limited by L hemi-weakness/incoordination, L inattention, poor balance/postural reactions and decreased activity tolerance. Pt needing mod A to come to sitting EOB with cues for sequencing. Pt requiring heavy max A to boost to stand with cues for weight shift to R and max A physical assist to transfer weight to R as pt with strong L lateral lean in standing with tendency to rotate trunk to L. Current plan remains appropriate to address deficits and maximize functional independence and decrease caregiver burden. Pt continues to benefit from skilled PT services to progress toward functional mobility goals.     If plan is discharge home, recommend the following: Two people to help with walking and/or transfers;Two people to help with bathing/dressing/bathroom;Help with stairs or ramp for entrance;Assist for transportation;Direct supervision/assist for financial management;Assistance with cooking/housework;Supervision due to cognitive status   Can travel by private vehicle        Equipment Recommendations  None recommended by PT    Recommendations for Other Services       Precautions / Restrictions Precautions Precautions: Fall Precaution Comments:  pusher L; glasses, some L lower field cut (pt endorses double vision even prior to CVA) Restrictions Weight Bearing Restrictions Per Provider Order: No     Mobility  Bed Mobility Overal bed mobility: Needs Assistance Bed Mobility: Supine to Sit, Sit to Supine     Supine to sit: Mod assist Sit to supine: Max assist   General bed mobility comments: assist for trunk elevation/lowering, LLE progression to/from EOB, bringing LUE into lap when moving to EOB, scooting to EOB with assist of bed pad.    Transfers Overall transfer level: Needs assistance Equipment used: Rolling walker (2 wheels) Transfers: Sit to/from Stand, Bed to chair/wheelchair/BSC Sit to Stand: Max assist           General transfer comment: heavy max assist for power up, rise, steadying. Stand x3, max multimodal cues and asssit to shift weight to R as pt with strong L lateral lean and with tendency to turn shoudlers and hips to L as well    Ambulation/Gait               General Gait Details: unable   Stairs             Wheelchair Mobility     Tilt Bed    Modified Rankin (Stroke Patients Only) Modified Rankin (Stroke Patients Only) Pre-Morbid Rankin Score: No significant disability Modified Rankin: Severe disability     Balance Overall balance assessment: Needs assistance Sitting-balance support: Single extremity supported, Feet supported Sitting balance-Leahy Scale: Fair Sitting balance - Comments: initially poor, but improving to sitting unsupported with feedback of mirror to correct posture to upright. mod cuing for placing LUE in lap to prevent pushing Postural  control: Left lateral lean Standing balance support: Bilateral upper extremity supported Standing balance-Leahy Scale: Zero Standing balance comment: max A                            Cognition Arousal: Alert Behavior During Therapy: WFL for tasks assessed/performed Overall Cognitive Status: Impaired/Different  from baseline Area of Impairment: Attention, Safety/judgement, Orientation                 Orientation Level: Disoriented to, Place Current Attention Level: Sustained     Safety/Judgement: Decreased awareness of deficits, Decreased awareness of safety     General Comments: requires step-by-step cuing for sequencing mobility, L inattention with leaving LUE behind when scooting towards EOB        Exercises General Exercises - Lower Extremity Hip Flexion/Marching: AROM, AAROM, Right, Left, 10 reps, Seated    General Comments General comments (skin integrity, edema, etc.): VSS on RA, pt spouse and SIL present and supportive      Pertinent Vitals/Pain Pain Assessment Pain Assessment: No/denies pain    Home Living                          Prior Function            PT Goals (current goals can now be found in the care plan section) Acute Rehab PT Goals Patient Stated Goal: return home PT Goal Formulation: With patient Time For Goal Achievement: 03/01/23 Progress towards PT goals: Progressing toward goals    Frequency    Min 1X/week      PT Plan      Co-evaluation              AM-PAC PT 6 Clicks Mobility   Outcome Measure  Help needed turning from your back to your side while in a flat bed without using bedrails?: A Lot Help needed moving from lying on your back to sitting on the side of a flat bed without using bedrails?: A Lot Help needed moving to and from a bed to a chair (including a wheelchair)?: Total Help needed standing up from a chair using your arms (e.g., wheelchair or bedside chair)?: Total Help needed to walk in hospital room?: Total Help needed climbing 3-5 steps with a railing? : Total 6 Click Score: 8    End of Session Equipment Utilized During Treatment: Gait belt Activity Tolerance: Patient limited by fatigue;Patient tolerated treatment well Patient left: with call bell/phone within reach;in bed;with bed alarm  set;with SCD's reapplied;with family/visitor present Nurse Communication: Mobility status PT Visit Diagnosis: Other abnormalities of gait and mobility (R26.89);Other symptoms and signs involving the nervous system (R29.898);Hemiplegia and hemiparesis Hemiplegia - Right/Left: Left Hemiplegia - dominant/non-dominant: Non-dominant Hemiplegia - caused by: Cerebral infarction     Time: 8641-8576 PT Time Calculation (min) (ACUTE ONLY): 25 min  Charges:    $Therapeutic Exercise: 8-22 mins $Therapeutic Activity: 8-22 mins PT General Charges $$ ACUTE PT VISIT: 1 Visit                     Hajime Asfaw R. PTA Acute Rehabilitation Services Office: [phone]   Therisa CHRISTELLA Boor 02/17/2023, 2:48 PM

## 2023-02-17 NOTE — Plan of Care (Signed)
  Problem: Education: Goal: Knowledge of disease or condition will improve Outcome: Progressing Goal: Knowledge of secondary prevention will improve (MUST DOCUMENT ALL) Outcome: Progressing Goal: Knowledge of patient specific risk factors will improve (DELETE if not current risk factor) Outcome: Progressing   Problem: Ischemic Stroke/TIA Tissue Perfusion: Goal: Complications of ischemic stroke/TIA will be minimized Outcome: Progressing   Problem: Coping: Goal: Will verbalize positive feelings about self Outcome: Progressing Goal: Will identify appropriate support needs Outcome: Progressing   Problem: Health Behavior/Discharge Planning: Goal: Ability to manage health-related needs will improve Outcome: Progressing Goal: Goals will be collaboratively established with patient/family Outcome: Progressing   Problem: Self-Care: Goal: Ability to participate in self-care as condition permits will improve Outcome: Progressing Goal: Verbalization of feelings and concerns over difficulty with self-care will improve Outcome: Progressing Goal: Ability to communicate needs accurately will improve Outcome: Progressing   Problem: Nutrition: Goal: Risk of aspiration will decrease Outcome: Progressing Goal: Dietary intake will improve Outcome: Progressing   Problem: Education: Goal: Knowledge of General Education information will improve Description: Including pain rating scale, medication(s)/side effects and non-pharmacologic comfort measures Outcome: Progressing   Problem: Health Behavior/Discharge Planning: Goal: Ability to manage health-related needs will improve Outcome: Progressing   Problem: Clinical Measurements: Goal: Ability to maintain clinical measurements within normal limits will improve Outcome: Progressing Goal: Will remain free from infection Outcome: Progressing Goal: Diagnostic test results will improve Outcome: Progressing Goal: Respiratory complications will  improve Outcome: Progressing Goal: Cardiovascular complication will be avoided Outcome: Progressing   Problem: Activity: Goal: Risk for activity intolerance will decrease Outcome: Progressing   Problem: Nutrition: Goal: Adequate nutrition will be maintained Outcome: Progressing   Problem: Coping: Goal: Level of anxiety will decrease Outcome: Progressing   Problem: Elimination: Goal: Will not experience complications related to bowel motility Outcome: Progressing Goal: Will not experience complications related to urinary retention Outcome: Progressing   Problem: Pain Managment: Goal: General experience of comfort will improve and/or be controlled Outcome: Progressing   Problem: Safety: Goal: Ability to remain free from injury will improve Outcome: Progressing   Problem: Skin Integrity: Goal: Risk for impaired skin integrity will decrease Outcome: Progressing   Problem: Education: Goal: Understanding of CV disease, CV risk reduction, and recovery process will improve Outcome: Progressing Goal: Individualized Educational Video(s) Outcome: Progressing   Problem: Activity: Goal: Ability to return to baseline activity level will improve Outcome: Progressing   Problem: Cardiovascular: Goal: Ability to achieve and maintain adequate cardiovascular perfusion will improve Outcome: Progressing Goal: Vascular access site(s) Level 0-1 will be maintained Outcome: Progressing   Problem: Health Behavior/Discharge Planning: Goal: Ability to safely manage health-related needs after discharge will improve Outcome: Progressing   Problem: Safety: Goal: Non-violent Restraint(s) Outcome: Progressing   Problem: Education: Goal: Knowledge of disease or condition will improve Outcome: Progressing Goal: Knowledge of secondary prevention will improve (MUST DOCUMENT ALL) Outcome: Progressing Goal: Knowledge of patient specific risk factors will improve (DELETE if not current risk  factor) Outcome: Progressing

## 2023-02-17 NOTE — Progress Notes (Signed)
 STROKE TEAM PROGRESS NOTE   INTERIM HISTORY/SUBJECTIVE Brother and other family members are at bedside.  Patient reclining in bed for lunch.  She is more awake alert, left upper extremity and lower extremity continue to improve slowly.  Pending CIR.  OBJECTIVE  CBC    Component Value Date/Time   WBC 4.7 02/17/2023 0534   RBC 4.47 02/17/2023 0534   HGB 12.6 (L) 02/17/2023 0534   HCT 39.1 02/17/2023 0534   PLT 170 02/17/2023 0534   MCV 87.5 02/17/2023 0534   MCH 28.2 02/17/2023 0534   MCHC 32.2 02/17/2023 0534   RDW 14.4 02/17/2023 0534   LYMPHSABS 1.2 02/14/2023 0406   MONOABS 0.7 02/14/2023 0406   EOSABS 0.2 02/14/2023 0406   BASOSABS 0.0 02/14/2023 0406    BMET    Component Value Date/Time   NA 139 02/17/2023 0534   K 3.9 02/17/2023 0534   CL 103 02/17/2023 0534   CO2 24 02/17/2023 0534   GLUCOSE 97 02/17/2023 0534   BUN 15 02/17/2023 0534   CREATININE 1.32 (H) 02/17/2023 0534   CALCIUM  9.2 02/17/2023 0534   GFRNONAA 58 (L) 02/17/2023 0534    IMAGING past 24 hours VAS US  LOWER EXTREMITY VENOUS (DVT) Result Date: 02/17/2023  Lower Venous DVT Study Patient Name:  Edward Rodgers  Date of Exam:   02/17/2023 Medical Rec #: [id number]       Accession #:    [id number] Date of Birth: 1950-08-20       Patient Gender: M Patient Age:   73 years Exam Location:  [facility] Procedure:      VAS US  LOWER EXTREMITY VENOUS (DVT) Referring Phys: ARY Shaquira Moroz --------------------------------------------------------------------------------  Indications: Stroke, and Embolic stroke.  Comparison Study: No prior exam. Performing Technologist: Edilia Elden Appl  Examination Guidelines: A complete evaluation includes B-mode imaging, spectral Doppler, color Doppler, and power Doppler as needed of all accessible portions of each vessel. Bilateral testing is considered an integral part of a complete examination. Limited examinations for reoccurring indications may be performed as noted. The reflux  portion of the exam is performed with the patient in reverse Trendelenburg.  +---------+---------------+---------+-----------+----------+--------------+ RIGHT    CompressibilityPhasicitySpontaneityPropertiesThrombus Aging +---------+---------------+---------+-----------+----------+--------------+ CFV      Full           Yes      Yes                                 +---------+---------------+---------+-----------+----------+--------------+ SFJ      Full           Yes      Yes                                 +---------+---------------+---------+-----------+----------+--------------+ FV Prox  Full                                                        +---------+---------------+---------+-----------+----------+--------------+ FV Mid   Full                                                        +---------+---------------+---------+-----------+----------+--------------+  FV DistalFull                                                        +---------+---------------+---------+-----------+----------+--------------+ PFV      Full                                                        +---------+---------------+---------+-----------+----------+--------------+ POP      Full           Yes      Yes                                 +---------+---------------+---------+-----------+----------+--------------+ PTV      Full                                                        +---------+---------------+---------+-----------+----------+--------------+ PERO     Full                                                        +---------+---------------+---------+-----------+----------+--------------+   +---------+---------------+---------+-----------+----------+--------------+ LEFT     CompressibilityPhasicitySpontaneityPropertiesThrombus Aging +---------+---------------+---------+-----------+----------+--------------+ CFV      Full           Yes      Yes                                  +---------+---------------+---------+-----------+----------+--------------+ SFJ      Full           Yes      Yes                                 +---------+---------------+---------+-----------+----------+--------------+ FV Prox  Full                                                        +---------+---------------+---------+-----------+----------+--------------+ FV Mid   Full                                                        +---------+---------------+---------+-----------+----------+--------------+ FV DistalFull                                                        +---------+---------------+---------+-----------+----------+--------------+  PFV      Full                                                        +---------+---------------+---------+-----------+----------+--------------+ POP      Full           Yes      Yes                                 +---------+---------------+---------+-----------+----------+--------------+ PTV      Full                                                        +---------+---------------+---------+-----------+----------+--------------+ PERO     Full                                                        +---------+---------------+---------+-----------+----------+--------------+     Summary: BILATERAL: - No evidence of deep vein thrombosis seen in the lower extremities, bilaterally. -No evidence of popliteal cyst, bilaterally.   *See table(s) above for measurements and observations. Electronically signed by Norman Serve on 02/17/2023 at 1:52:27 PM.    Final     Vitals:   02/17/23 0746 02/17/23 1143 02/17/23 1546 02/17/23 1605  BP: (!) 157/100 122/83 (!) 146/87 (!) 144/95  Pulse: 80 90 82 80  Resp: 20 20 20 20   Temp: 97.7 F (36.5 C) 98 F (36.7 C) 98.1 F (36.7 C) 98.1 F (36.7 C)  TempSrc: Oral Oral Oral Oral  SpO2: 97% 93% 98% 100%  Weight:      Height:         PHYSICAL  EXAM General:  Alert, well-nourished, well-developed patient in no acute distress Psych:  Mood and affect appropriate for situation CV: Regular rate and rhythm on monitor Respiratory:  Regular, unlabored respirations on room air GI: Abdomen soft and nontender  Neuro - awake, alert, eyes open, orientated to age, place, time and people.  Agitated overnight. impulsive No aphasia, fluent language, following all simple commands. Able to name and repeat. No gaze palsy, tracking bilaterally, visual field full. No significant facial droop. Tongue midline.  Left upper extremity 2+/5, finger movement 3/5.  Left lower extremity proximal 2/5, toe movements 3/5.  Diminished left light touch sensation.  Right FTN intact, gait not tested.    ASSESSMENT/PLAN [name] is a 73 y.o. male withPMH significant for HTN, prior cerebellar stroke, hypothyroidism who was BIB EMS as a CODE STROKE 2/1 early AM due to left-sided weakness, numbness, dysconjugate gaze, slight R gaze preference and inconsistent left visual field testing. Per EMS, wife stated his LKW was around 0115.  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 Thrombectomy was discussed by neurologist Dr. Vanessa, IR Dr. Dolphus and family who consented. However, patient improved from an NIH of 11 to an NIH of 3 en route to IR suite and it was felt  that deficits were too small to intervene, but appropriate for TNKase .  STAT MRI showed diffusion restriction in R medial temporal with no signal change on FLAIR. At that time, NIH was fluctuating from 3-5 due to L arm and leg weakness fluctuating in intensity.   Stroke:  right PCA scattered infarct with thalamic HT and punctate left PCA infarcts with R P2 occlusion s/p TNK and IR are with TICI3 repeat fusion, etiology: Concerning for cardioembolic source Code Stroke CT head - No acute abnormality.  3 cm anterior left parafalcine meningioma, enlarged from 2015. CTA  head & neck w/Perfusion Right P2 occlusion. Extensive intracranial atherosclerosis at bilateral ICA bulbs and siphons. CTP 0/25cc  MRI Diffusion hyperintensity in the right PCA distribution affecting the medial temporal lobe and thalamus. FLAIR hyperintensity is not yet detected. left anterior parafalcine meningioma. NIHSS 11->3->7 Repeat CTH Subtle but likely cytotoxic edema now seen at areas of right temporal and thalamic diffusion abnormality Repeat CT Perfusion 0/36 S/p IR with right distal P2 occlusion and TICI3 reperfusion MRI Progressive acute right PCA territory infarct with extension most notable in the right periatrial white matter and occipital lobe. Suspected subcentimeter hemorrhage centrally in the right thalamic infarct. New punctate acute infarcts in the left occipital lobe. 2/3 CT repeat  Involving cytotoxic edema in the right thalamus and right occipital lobe at the site of known infarcts. No acute hemorrhage. Unchanged large left anterior para falcine calcified meningioma. 2D Echo EF 55%, no LV thrombus LE venous Doppler no DVT Will consider loop recorder to rule out A-fib LDL 30 HgbA1c 6.4 UDS negative VTE prophylaxis -Lovenox  aspirin  325 mg daily prior to admission, now on ASA 81 and plavix  DAP for 3 weeks and then plavix  alone. Therapy recommendations: CIR Disposition:  pending  History of stroke 03/2013 right PICA small infarct, EF 35 to 40%, LDL 112.  Discharged on aspirin  81 and statin.  History of CHF 03/2013 2D echo EF 35 to 40% Home medication including metoprolol , spironolactone  2D echo this admission EF 55% Resumed home metoprolol  and spironolactone  Will consider loop recorder to rule out A-fib  Hypertension Home meds: Spironolactone  25, toprol -xl 100mg  Cleviprex  gtt off Resumed home metoprolol  Add amlodipine  10 Spironolactone  discontinued given elevated creatinine Labetalol  IV PRN BP goal less than 160 given small HT Monitor BP goal  normotensive  Hyperlipidemia Home meds: Lipitor  80 LDL 30, goal < 70 Resume home Lipitor  80 Consider statin on discharge  AKI creatinine 1.20--1.30--0.96 --1.44--1.32 D/c spironolactone  Encourage p.o. intake On IVF @ 50 BMP monitoring  Sundowning Delirium  Likely mild baseline cognitive impairment Resume home Effexor  Increase seroquel  HS from 25 to 50mg   Other Stroke Risk Factors Obesity, Body mass index is 32.95 kg/m., BMI >/= 30 associated with increased stroke risk, recommend weight loss, diet and exercise as appropriate  Former smoker  Other acute issues Gout, resume allopurinol  Stable left frontal meningioma  Hospital day # 3    Ary Cummins, MD PhD Stroke Neurology 02/17/2023 5:48 PM    To contact Stroke Continuity provider, please refer to [email or web address]. After hours, contact General Neurology

## 2023-02-17 NOTE — TOC CM/SW Note (Signed)
 Transition of Care [facility]) - Inpatient Brief Assessment   Patient Details  Name: Edward Rodgers MRN: [id number] Date of Birth: 17-Jul-1950  [facility] CM/SW Contact:    Andrez JULIANNA George, RN Phone Number: 02/17/2023, 1:59 PM   Clinical Narrative:  CIR starting insurance for rehab stay.  TOC following.  Transition of Care Asessment: Insurance and Status: Insurance coverage has been reviewed Patient has primary care physician: No Home environment has been reviewed: home with spouse   Prior/Current Home Services: No current home services Social Drivers of Health Review: SDOH reviewed no interventions necessary Readmission risk has been reviewed: Yes Transition of care needs: transition of care needs identified, TOC will continue to follow

## 2023-02-18 DIAGNOSIS — I63531 Cerebral infarction due to unspecified occlusion or stenosis of right posterior cerebral artery: Secondary | ICD-10-CM | POA: Diagnosis not present

## 2023-02-18 LAB — CBC
HCT: 39.6 % (ref 39.0–52.0)
Hemoglobin: 12.6 g/dL — ABNORMAL LOW (ref 13.0–17.0)
MCH: 28 pg (ref 26.0–34.0)
MCHC: 31.8 g/dL (ref 30.0–36.0)
MCV: 88 fL (ref 80.0–100.0)
Platelets: 175 10*3/uL (ref 150–400)
RBC: 4.5 MIL/uL (ref 4.22–5.81)
RDW: 14.4 % (ref 11.5–15.5)
WBC: 4.8 10*3/uL (ref 4.0–10.5)
nRBC: 0 % (ref 0.0–0.2)

## 2023-02-18 LAB — BASIC METABOLIC PANEL
Anion gap: 11 (ref 5–15)
BUN: 16 mg/dL (ref 8–23)
CO2: 24 mmol/L (ref 22–32)
Calcium: 9.5 mg/dL (ref 8.9–10.3)
Chloride: 104 mmol/L (ref 98–111)
Creatinine, Ser: 1.21 mg/dL (ref 0.61–1.24)
GFR, Estimated: >60 mL/min (ref >60)
Glucose, Bld: 102 mg/dL — ABNORMAL HIGH (ref 70–99)
Potassium: 4 mmol/L (ref 3.5–5.1)
Sodium: 139 mmol/L (ref 135–145)

## 2023-02-18 MED ORDER — ONDANSETRON 4 MG PO TBDP
4.0000 mg | ORAL_TABLET | Freq: Three times a day (TID) | ORAL | Status: DC | PRN
  Administered 2023-02-19: 4 mg via ORAL
  Filled 2023-02-18 (×2): qty 1

## 2023-02-18 MED ORDER — SPIRONOLACTONE 25 MG PO TABS
25.0000 mg | ORAL_TABLET | Freq: Every day | ORAL | Status: DC
  Administered 2023-02-18 – 2023-02-21 (×4): 25 mg via ORAL
  Filled 2023-02-18 (×4): qty 1

## 2023-02-18 NOTE — Progress Notes (Signed)
 Inpatient Rehabilitation Admissions Coordinator   I await insurance determination for possible Cir admit. Will need LOOP placed prior to admit.  Jeannetta Millman, RN, MSN Rehab Admissions Coordinator [phone] 02/18/2023 11:19 AM

## 2023-02-18 NOTE — Progress Notes (Addendum)
 STROKE TEAM PROGRESS NOTE   INTERIM HISTORY/SUBJECTIVE Patient is awake and alert and visiting with his cousins.  He has been hemodynamically stable overnight, and his neurological exam is stable.  OBJECTIVE  CBC    Component Value Date/Time   WBC 4.8 02/18/2023 0552   RBC 4.50 02/18/2023 0552   HGB 12.6 (L) 02/18/2023 0552   HCT 39.6 02/18/2023 0552   PLT 175 02/18/2023 0552   MCV 88.0 02/18/2023 0552   MCH 28.0 02/18/2023 0552   MCHC 31.8 02/18/2023 0552   RDW 14.4 02/18/2023 0552   LYMPHSABS 1.2 02/14/2023 0406   MONOABS 0.7 02/14/2023 0406   EOSABS 0.2 02/14/2023 0406   BASOSABS 0.0 02/14/2023 0406    BMET    Component Value Date/Time   NA 139 02/18/2023 0552   K 4.0 02/18/2023 0552   CL 104 02/18/2023 0552   CO2 24 02/18/2023 0552   GLUCOSE 102 (H) 02/18/2023 0552   BUN 16 02/18/2023 0552   CREATININE 1.21 02/18/2023 0552   CALCIUM  9.5 02/18/2023 0552   GFRNONAA >60 02/18/2023 0552    IMAGING past 24 hours VAS US  LOWER EXTREMITY VENOUS (DVT) Result Date: 02/17/2023  Lower Venous DVT Study Patient Name:  ASKARI KINLEY  Date of Exam:   02/17/2023 Medical Rec #: [id number]       Accession #:    [id number] Date of Birth: Nov 25, 1950       Patient Gender: M Patient Age:   73 years Exam Location:  [facility] Procedure:      VAS US  LOWER EXTREMITY VENOUS (DVT) Referring Phys: ARY Castiel Lauricella --------------------------------------------------------------------------------  Indications: Stroke, and Embolic stroke.  Comparison Study: No prior exam. Performing Technologist: Edilia Elden Appl  Examination Guidelines: A complete evaluation includes B-mode imaging, spectral Doppler, color Doppler, and power Doppler as needed of all accessible portions of each vessel. Bilateral testing is considered an integral part of a complete examination. Limited examinations for reoccurring indications may be performed as noted. The reflux portion of the exam is performed with the patient in  reverse Trendelenburg.  +---------+---------------+---------+-----------+----------+--------------+ RIGHT    CompressibilityPhasicitySpontaneityPropertiesThrombus Aging +---------+---------------+---------+-----------+----------+--------------+ CFV      Full           Yes      Yes                                 +---------+---------------+---------+-----------+----------+--------------+ SFJ      Full           Yes      Yes                                 +---------+---------------+---------+-----------+----------+--------------+ FV Prox  Full                                                        +---------+---------------+---------+-----------+----------+--------------+ FV Mid   Full                                                        +---------+---------------+---------+-----------+----------+--------------+ FV  DistalFull                                                        +---------+---------------+---------+-----------+----------+--------------+ PFV      Full                                                        +---------+---------------+---------+-----------+----------+--------------+ POP      Full           Yes      Yes                                 +---------+---------------+---------+-----------+----------+--------------+ PTV      Full                                                        +---------+---------------+---------+-----------+----------+--------------+ PERO     Full                                                        +---------+---------------+---------+-----------+----------+--------------+   +---------+---------------+---------+-----------+----------+--------------+ LEFT     CompressibilityPhasicitySpontaneityPropertiesThrombus Aging +---------+---------------+---------+-----------+----------+--------------+ CFV      Full           Yes      Yes                                  +---------+---------------+---------+-----------+----------+--------------+ SFJ      Full           Yes      Yes                                 +---------+---------------+---------+-----------+----------+--------------+ FV Prox  Full                                                        +---------+---------------+---------+-----------+----------+--------------+ FV Mid   Full                                                        +---------+---------------+---------+-----------+----------+--------------+ FV DistalFull                                                        +---------+---------------+---------+-----------+----------+--------------+  PFV      Full                                                        +---------+---------------+---------+-----------+----------+--------------+ POP      Full           Yes      Yes                                 +---------+---------------+---------+-----------+----------+--------------+ PTV      Full                                                        +---------+---------------+---------+-----------+----------+--------------+ PERO     Full                                                        +---------+---------------+---------+-----------+----------+--------------+     Summary: BILATERAL: - No evidence of deep vein thrombosis seen in the lower extremities, bilaterally. -No evidence of popliteal cyst, bilaterally.   *See table(s) above for measurements and observations. Electronically signed by Norman Serve on 02/17/2023 at 1:52:27 PM.    Final     Vitals:   02/17/23 2350 02/18/23 0334 02/18/23 0805 02/18/23 1116  BP: (!) 135/108 (!) 149/98 (!) 167/94 (!) 168/103  Pulse:  87 74 78  Resp: 18 18 20 18   Temp: 98.1 F (36.7 C) 98.4 F (36.9 C) 98.7 F (37.1 C) 98.4 F (36.9 C)  TempSrc: Oral Oral Oral Oral  SpO2: 99% 96% 96% 96%  Weight:      Height:         PHYSICAL EXAM General:  Alert,  well-nourished, well-developed patient in no acute distress Psych:  Mood and affect appropriate for situation CV: Regular rate and rhythm on monitor Respiratory:  Regular, unlabored respirations on room air GI: Abdomen soft and nontender   NEURO:  Mental Status: AA&Ox3  Speech/Language: speech is without dysarthria or aphasia.  Cranial Nerves:  II: PERRL. III, IV, VI: EOMI. Eyelids elevate symmetrically.  V: Sensation is intact to light touch and symmetrical to face.  VII: Smile is symmetrical.  VIII: hearing intact to voice. IX, X: Phonation is normal.  XII: tongue is midline without fasciculations. Motor: 5 out of 5 strength to right upper and lower extremities, able to move fingers of left hand only, unable to lift off the bed, 2/5 strength in left lower extremity Tone: is normal and bulk is normal Sensation- Intact to light touch bilaterally. Sharp/Dull   Vibration.   Coordination: FTN intact on the right Gait- deferred    ASSESSMENT/PLAN Mr. KEMP GOMES is a 73 y.o. male withPMH significant for HTN, prior cerebellar stroke, hypothyroidism who was BIB EMS as a CODE STROKE 2/1 early AM due to left-sided weakness, numbness, dysconjugate gaze, slight R gaze preference and inconsistent left visual field testing. Per EMS, wife stated his LKW was around 0115.  CTH negative,  CTA showed R P2 occlusion with 25cc penumbra in R occipital lobe. Thrombectomy was discussed by neurologist Dr. Vanessa, IR Dr. Dolphus and family who consented. However, patient improved from an NIH of 11 to an NIH of 3 en route to IR suite and it was felt that deficits were too small to intervene, but appropriate for TNKase .  STAT MRI showed diffusion restriction in R medial temporal with no signal change on FLAIR. At that time, NIH was fluctuating from 3-5 due to L arm and leg weakness fluctuating in intensity.   Stroke:  right PCA scattered infarct with thalamic HT and punctate left PCA infarcts with R P2  occlusion s/p TNK and IR are with TICI3 repeat fusion, etiology: Concerning for cardioembolic source Code Stroke CT head - No acute abnormality.  3 cm anterior left parafalcine meningioma, enlarged from 2015. CTA head & neck w/Perfusion Right P2 occlusion. Extensive intracranial atherosclerosis at bilateral ICA bulbs and siphons. CTP 0/25cc  MRI Diffusion hyperintensity in the right PCA distribution affecting the medial temporal lobe and thalamus. FLAIR hyperintensity is not yet detected. left anterior parafalcine meningioma. NIHSS 11->3->7 Repeat CTH Subtle but likely cytotoxic edema now seen at areas of right temporal and thalamic diffusion abnormality Repeat CT Perfusion 0/36 S/p IR with right distal P2 occlusion and TICI3 reperfusion MRI Progressive acute right PCA territory infarct with extension most notable in the right periatrial white matter and occipital lobe. Suspected subcentimeter hemorrhage centrally in the right thalamic infarct. New punctate acute infarcts in the left occipital lobe. 2/3 CT repeat  Involving cytotoxic edema in the right thalamus and right occipital lobe at the site of known infarcts. No acute hemorrhage. Unchanged large left anterior para falcine calcified meningioma. 2D Echo EF 55%, no LV thrombus LE venous Doppler no DVT Will consider loop recorder to rule out A-fib on discharge LDL 30 HgbA1c 6.4 UDS negative VTE prophylaxis -Lovenox  aspirin  325 mg daily prior to admission, now on ASA 81 and plavix  DAPT for 3 weeks and then plavix  alone. Therapy recommendations: CIR Disposition:  pending  History of stroke 03/2013 right PICA small infarct, EF 35 to 40%, LDL 112.  Discharged on aspirin  81 and statin.  History of CHF 03/2013 2D echo EF 35 to 40% Home medication including metoprolol , spironolactone  2D echo this admission EF 55% Resumed home metoprolol  and spironolactone  Will consider loop recorder to rule out A-fib on discharge  Hypertension Home meds:  Spironolactone  25, toprol -xl 100mg  Cleviprex  gtt off Resumed home metoprolol  and spironolactone  Add amlodipine  10 Labetalol  IV PRN BP goal less than 160 given small HT Monitor BP goal normotensive  Hyperlipidemia Home meds: Lipitor  80 LDL 30, goal < 70 Resume home Lipitor  80 Consider statin on discharge  AKI creatinine 1.20--1.30--0.96 --1.44--1.32-> 1.21 improved Encourage p.o. intake Off IVF @ 50 BMP monitoring  Sundowning Delirium  Likely mild baseline cognitive impairment Resume home Effexor  Increase seroquel  HS from 25 to 50mg  improved  Other Stroke Risk Factors Obesity, Body mass index is 32.95 kg/m., BMI >/= 30 associated with increased stroke risk, recommend weight loss, diet and exercise as appropriate  Former smoker  Other acute issues Gout, resume allopurinol  Stable left frontal meningioma  Hospital day # 4  Patient seen by NP and then by MD, MD to edit note as needed. Cortney E Everitt Clint Kill , MSN, AGACNP-BC [facility] See Amion for schedule and pager information 02/18/2023 1:12 PM  ATTENDING NOTE: I reviewed above note and agree with the assessment and plan. Pt was  seen and examined.   No acute event overnight. Neuro stable. Still has left hemiparesis and working with PT OT. Cre improved, d/c IVF. Pending CIR  For detailed assessment and plan, please refer to above/below as I have made changes wherever appropriate.   Ary Cummins, MD PhD Stroke Neurology 02/18/2023 5:01 PM     To contact Stroke Continuity provider, please refer to [email or web address]. After hours, contact General Neurology

## 2023-02-18 NOTE — Progress Notes (Signed)
 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  Reports that his let side weakness is getting better slowly. No complaints regarding R CFA puncture site.   Allergies: Patient has no known allergies.  Medications: Prior to Admission medications   Medication Sig Start Date End Date Taking? Authorizing Provider  alendronate (FOSAMAX) 70 MG tablet Take 70 mg by mouth once a week. Take with a full glass of water  on an empty stomach. mondays   Yes [provider]  allopurinol  (ZYLOPRIM ) 300 MG tablet Take 300 mg by mouth every morning.   Yes [provider]  aspirin  325 MG tablet Take 1 tablet (325 mg total) by mouth daily. 07/16/17  Yes Rehman, Claudis PENNER, MD  atorvastatin  (LIPITOR ) 80 MG tablet Take 80 mg by mouth daily.   Yes [provider]  Calcium  Carb-Cholecalciferol  (CALCIUM  600 + D PO) Take 1 tablet by mouth in the morning and at bedtime.   Yes [provider]  empagliflozin (JARDIANCE) 25 MG TABS tablet Take 12.5 mg by mouth daily.   Yes [provider]  latanoprost  (XALATAN ) 0.005 % ophthalmic solution Place 1 drop into the right eye at bedtime.   Yes [provider]  levothyroxine  (SYNTHROID ) 88 MCG tablet Take 88 mcg by mouth daily before breakfast.   Yes [provider]  meloxicam  (MOBIC ) 7.5 MG tablet Take 7.5 mg by mouth daily.   Yes [provider]  metoprolol  succinate (TOPROL -XL) 100 MG 24 hr tablet Take 100 mg by mouth daily. Take with or immediately following a meal.   Yes [provider]  mirabegron  ER (MYRBETRIQ ) 50 MG TB24 tablet Take 50 mg by mouth daily.   Yes [provider]  spironolactone  (ALDACTONE ) 25 MG tablet Take 25 mg by mouth  daily.   Yes [provider]  venlafaxine  XR (EFFEXOR -XR) 150 MG 24 hr capsule Take 150 mg by mouth every evening.   Yes [provider]     Vital Signs: BP (!) 174/92 (BP Location: Right Arm)   Pulse 73   Temp 98.5 F (36.9 C) (Oral)   Resp 18   Ht 5' 9 (1.753 m)   Wt 223 lb 1.7 oz (101.2 kg)   SpO2 96%   BMI 32.95 kg/m   Physical Exam Vitals and nursing note reviewed.  Constitutional:      Appearance: Edward Rodgers is well-developed.  HENT:     Head: Normocephalic.  Cardiovascular:     Comments: Right groin access site is soft with no active bleeding and no appreciable pseudoaneurysm. Non tender.  Pulmonary:     Effort: Pulmonary effort is normal.  Musculoskeletal:     Cervical back: Normal range of motion.  Skin:    General: Skin is warm and dry.  Neurological:     Mental Status: Edward Rodgers is alert and oriented to person, place, and time. Mental status is at baseline.     Comments: Alert, aware and oriented X 3 Speech and comprehension is intact.  No facial droop noted.  Weakness on left ext. Able to make a fist and lift the arm a little against gravity.  Able to perform dorsiflexion/plantar flexion with left leg, unable to lift it from the bed   Psychiatric:  Mood and Affect: Mood normal.        Behavior: Behavior normal.     Imaging: VAS US  LOWER EXTREMITY VENOUS (DVT) Result Date: 02/17/2023  Lower Venous DVT Study Patient Name:  Edward Rodgers  Date of Exam:   02/17/2023 Medical Rec #: [id number]       Accession #:    [id number] Date of Birth: 1950-08-01       Patient Gender: M Patient Age:   73 years Exam Location:  [facility] Procedure:      VAS US  LOWER EXTREMITY VENOUS (DVT) Referring Phys: ARY XU --------------------------------------------------------------------------------  Indications: Stroke, and Embolic stroke.  Comparison Study: No prior exam. Performing Technologist: Edilia Elden Appl  Examination Guidelines: A complete evaluation  includes B-mode imaging, spectral Doppler, color Doppler, and power Doppler as needed of all accessible portions of each vessel. Bilateral testing is considered an integral part of a complete examination. Limited examinations for reoccurring indications may be performed as noted. The reflux portion of the exam is performed with the patient in reverse Trendelenburg.  +---------+---------------+---------+-----------+----------+--------------+ RIGHT    CompressibilityPhasicitySpontaneityPropertiesThrombus Aging +---------+---------------+---------+-----------+----------+--------------+ CFV      Full           Yes      Yes                                 +---------+---------------+---------+-----------+----------+--------------+ SFJ      Full           Yes      Yes                                 +---------+---------------+---------+-----------+----------+--------------+ FV Prox  Full                                                        +---------+---------------+---------+-----------+----------+--------------+ FV Mid   Full                                                        +---------+---------------+---------+-----------+----------+--------------+ FV DistalFull                                                        +---------+---------------+---------+-----------+----------+--------------+ PFV      Full                                                        +---------+---------------+---------+-----------+----------+--------------+ POP      Full           Yes      Yes                                 +---------+---------------+---------+-----------+----------+--------------+  PTV      Full                                                        +---------+---------------+---------+-----------+----------+--------------+ PERO     Full                                                         +---------+---------------+---------+-----------+----------+--------------+   +---------+---------------+---------+-----------+----------+--------------+ LEFT     CompressibilityPhasicitySpontaneityPropertiesThrombus Aging +---------+---------------+---------+-----------+----------+--------------+ CFV      Full           Yes      Yes                                 +---------+---------------+---------+-----------+----------+--------------+ SFJ      Full           Yes      Yes                                 +---------+---------------+---------+-----------+----------+--------------+ FV Prox  Full                                                        +---------+---------------+---------+-----------+----------+--------------+ FV Mid   Full                                                        +---------+---------------+---------+-----------+----------+--------------+ FV DistalFull                                                        +---------+---------------+---------+-----------+----------+--------------+ PFV      Full                                                        +---------+---------------+---------+-----------+----------+--------------+ POP      Full           Yes      Yes                                 +---------+---------------+---------+-----------+----------+--------------+ PTV      Full                                                        +---------+---------------+---------+-----------+----------+--------------+  PERO     Full                                                        +---------+---------------+---------+-----------+----------+--------------+     Summary: BILATERAL: - No evidence of deep vein thrombosis seen in the lower extremities, bilaterally. -No evidence of popliteal cyst, bilaterally.   *See table(s) above for measurements and observations. Electronically signed by Norman Serve on 02/17/2023 at 1:52:27 PM.     Final    CT HEAD WO CONTRAST ( ) Result Date: 02/16/2023 CLINICAL DATA:  Stroke follow-up EXAM: CT HEAD WITHOUT CONTRAST TECHNIQUE: Contiguous axial images were obtained from the base of the skull through the vertex without intravenous contrast. RADIATION DOSE REDUCTION: This exam was performed according to the departmental dose-optimization program which includes automated exposure control, adjustment of the mA and/or kV according to patient size and/or use of iterative reconstruction technique. COMPARISON:  None Available. FINDINGS: Brain: Involving cytotoxic edema in the right thalamus and right occipital lobe at the site of known infarcts. 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 No acute hemorrhage. No midline shift or significant mass effect. Vascular: No hyperdense vessel or unexpected calcification. Skull: Normal. Negative for fracture or focal lesion. Sinuses/Orbits: No acute finding. Other: None. IMPRESSION: 1. Involving cytotoxic edema in the right thalamus and right occipital lobe at the site of known infarcts. No acute hemorrhage. 2. Unchanged large left anterior para falcine calcified meningioma. Electronically Signed   By: Franky Stanford M.D.   On: 02/16/2023 01:23   ECHOCARDIOGRAM COMPLETE Result Date: 02/15/2023    ECHOCARDIOGRAM REPORT   Patient Name:   JAKOBE BLAU Date of Exam: 02/15/2023 Medical Rec #:  [id number]      Height:       69.0 in Accession #:    [id number]     Weight:       223.1 lb Date of Birth:  04-28-1950      BSA:          2.164 m Patient Age:    72 years       BP:           120/71 mmHg Patient Gender: M              HR:           81 bpm. Exam Location:  Inpatient Procedure: 2D Echo, Cardiac Doppler, Color Doppler and Intracardiac            Opacification Agent Indications:    Stroke  History:        Patient has prior history of Echocardiogram examinations, most                 recent 03/30/2013. CHF; Stroke.  Sonographer:    Amy Chionchio Referring  Phys: KHALIQDINA, SALMAN IMPRESSIONS  1. No evidence of LV thrombus. Definity  is administered. Left ventricular ejection fraction, by estimation, is 55%. The left ventricle has normal function. The left ventricle has no regional wall motion abnormalities. Left ventricular diastolic parameters are consistent with Grade I diastolic dysfunction (impaired relaxation).  2. Right ventricular systolic function was not well visualized. The right ventricular size is not well visualized. Tricuspid regurgitation signal is inadequate for assessing PA pressure.  3. The mitral valve is normal in structure. No evidence of mitral valve  regurgitation. No evidence of mitral stenosis.  4. The aortic valve was not well visualized. Aortic valve regurgitation is not visualized. No aortic stenosis is present.  5. Aortic dilatation noted. There is borderline dilatation of the aortic root, measuring 36 mm. There is mild dilatation of the ascending aorta, measuring 40 mm. Comparison(s): No prior Echocardiogram. FINDINGS  Left Ventricle: No evidence of LV thrombus. Definity  is administered. Left ventricular ejection fraction, by estimation, is 55%. The left ventricle has normal function. The left ventricle has no regional wall motion abnormalities. The left ventricular internal cavity size was normal in size. There is no left ventricular hypertrophy. Left ventricular diastolic parameters are consistent with Grade I diastolic dysfunction (impaired relaxation). Normal left ventricular filling pressure. Right Ventricle: The right ventricular size is not well visualized. Right vetricular wall thickness was not well visualized. Right ventricular systolic function was not well visualized. Tricuspid regurgitation signal is inadequate for assessing PA pressure. Left Atrium: Left atrial size was normal in size. Right Atrium: Right atrial size was normal in size. Pericardium: There is no evidence of pericardial effusion. Mitral Valve: The mitral valve  is normal in structure. Mild mitral annular calcification. No evidence of mitral valve regurgitation. No evidence of mitral valve stenosis. MV peak gradient, 3.1 mmHg. The mean mitral valve gradient is 1.0 mmHg. Tricuspid Valve: The tricuspid valve is not well visualized. Tricuspid valve regurgitation is not demonstrated. No evidence of tricuspid stenosis. Aortic Valve: The aortic valve was not well visualized. Aortic valve regurgitation is not visualized. No aortic stenosis is present. Aortic valve mean gradient measures 5.0 mmHg. Aortic valve peak gradient measures 8.5 mmHg. Aortic valve area, by VTI measures 1.87 cm. Pulmonic Valve: The pulmonic valve was not well visualized. Pulmonic valve regurgitation is not visualized. No evidence of pulmonic stenosis. Aorta: Aortic dilatation noted. There is borderline dilatation of the aortic root, measuring 36 mm. There is mild dilatation of the ascending aorta, measuring 40 mm. Venous: The inferior vena cava was not well visualized. IAS/Shunts: No atrial level shunt detected by color flow Doppler.  LEFT VENTRICLE PLAX 2D LVIDd:         5.10 cm      Diastology LVIDs:         4.10 cm      LV e' medial:    5.33 cm/s LV PW:         1.00 cm      LV E/e' medial:  6.7 LV IVS:        1.00 cm      LV e' lateral:   4.68 cm/s LVOT diam:     2.10 cm      LV E/e' lateral: 7.6 LV SV:         46 LV SV Index:   21 LVOT Area:     3.46 cm  LV Volumes (MOD) LV vol d, MOD A4C: 198.0 ml LV vol s, MOD A4C: 98.5 ml LV SV MOD A4C:     198.0 ml RIGHT VENTRICLE TAPSE (M-mode): 2.1 cm LEFT ATRIUM           Index        RIGHT ATRIUM           Index LA Vol (A4C): 56.0 ml 25.87 ml/m  RA Area:     11.10 cm  RA Volume:   20.60 ml  9.52 ml/m  AORTIC VALVE AV Area (Vmax):    1.99 cm AV Area (Vmean):   1.86 cm AV Area (VTI):     1.87 cm AV Vmax:           146.00 cm/s AV Vmean:          109.000 cm/s AV VTI:            0.244 m AV Peak Grad:      8.5 mmHg AV Mean  Grad:      5.0 mmHg LVOT Vmax:         83.80 cm/s LVOT Vmean:        58.400 cm/s LVOT VTI:          0.132 m LVOT/AV VTI ratio: 0.54  AORTA Ao Root diam: 3.60 cm Ao Asc diam:  4.00 cm MITRAL VALVE MV Area (PHT): 5.66 cm    SHUNTS MV Area VTI:   3.22 cm    Systemic VTI:  0.13 m MV Peak grad:  3.1 mmHg    Systemic Diam: 2.10 cm MV Mean grad:  1.0 mmHg MV Vmax:       0.88 m/s MV Vmean:      48.6 cm/s MV Decel Time: 134 msec MV E velocity: 35.70 cm/s MV A velocity: 76.10 cm/s MV E/A ratio:  0.47 Vishnu Priya Mallipeddi Electronically signed by Diannah Late Mallipeddi Signature Date/Time: 02/15/2023/2:14:49 PM    Final    MR BRAIN WO CONTRAST Result Date: 02/15/2023 CLINICAL DATA:  Stroke follow-up. Status post revascularization of right P2 occlusion. EXAM: MRI HEAD WITHOUT CONTRAST TECHNIQUE: Multiplanar, multiecho pulse sequences of the brain and surrounding structures were obtained without intravenous contrast. COMPARISON:  Head CT and MRI 02/14/2023 FINDINGS: The patient was unable to tolerate the complete examination which was terminated prematurely. Axial and coronal diffusion, axial T2, axial FLAIR, and sagittal T1 sequences were obtained and are mildly to moderately motion degraded. Brain: Restricted diffusion is again seen within the mesial right temporal lobe and right thalamus, mildly progressed from the prior MRI and with new hypointense signal centrally within the right thalamic infarct being suspicious for a small amount of hemorrhage (with assessment limited on this incomplete examination which did not include susceptibility weighted or gradient echo imaging). Patchy restricted diffusion in the right periatrial white matter and in the region of the posterior body and tail of the caudate nucleus is largely new, and there is also a new 2 cm focus of restricted diffusion in the right occipital lobe. 2 new punctate acute infarcts are present in the left occipital lobe. Patchy T2 hyperintensities elsewhere in  the cerebral white matter bilaterally are unchanged and nonspecific but compatible with mild chronic small vessel ischemic disease. 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 There is no midline shift or extra-axial fluid collection. There is mild generalized cerebral atrophy. Small chronic right cerebellar infarcts are again noted. Vascular: Major intracranial vascular flow voids are preserved. Skull and upper cervical spine: No suspicious marrow lesion. Advanced left facet arthrosis at C3-4 and C4-5. Sinuses/Orbits: Right cataract extraction. No evidence of significant inflammation in the paranasal sinuses. Clear mastoid air cells. Other: None. IMPRESSION: 1. Incomplete, motion degraded examination. 2. Progressive acute right PCA territory infarct with extension most notable in the right periatrial white matter and occipital lobe. Suspected subcentimeter hemorrhage centrally in the right thalamic infarct. 3. New punctate acute infarcts in the left occipital lobe. 4. Mild chronic small vessel ischemic  disease. 5. Known left anterior parafalcine meningioma. Electronically Signed   By: Dasie Hamburg M.D.   On: 02/15/2023 10:35    Labs:  CBC: Recent Labs    02/15/23 0501 02/16/23 0438 02/17/23 0534 02/18/23 0552  WBC 6.8 5.4 4.7 4.8  HGB 12.8* 12.8* 12.6* 12.6*  HCT 39.5 40.0 39.1 39.6  PLT 188 174 170 175    COAGS: Recent Labs    02/14/23 0406  INR 1.0  APTT 23*    BMP: Recent Labs    02/15/23 0501 02/16/23 0438 02/17/23 0534 02/18/23 0552  NA 136 136 139 139  K 3.2* 3.5 3.9 4.0  CL 104 102 103 104  CO2 25 22 24 24   GLUCOSE 118* 107* 97 102*  BUN 8 15 15 16   CALCIUM  9.3 9.3 9.2 9.5  CREATININE 0.96 1.44* 1.32* 1.21  GFRNONAA >60 52* 58* >60    LIVER FUNCTION TESTS: Recent Labs    02/14/23 0406  BILITOT 0.2  AST 19  ALT 19  ALKPHOS 46  PROT 6.8  ALBUMIN 3.6    Assessment and Plan:  73 y.o. male inpatient. History of HTN, CHF, prostate cancer,  cerebellar stroke. Presented to the ED at [facility] on 2.1.25  as a Code Stroke with left sided weakness, and right sided gaze. Status post complete revascularization of the occluded  right posterior cerebral artery distal P2 segment with 1 pass with an 043 distal aspiration and 062 aspiration combination, achieving a TICI 3 revascularization.   Left weakness improving, R CFA puncture site stable.  Further plan per neurology/ rehab team.   Please call NIR for questions and concerns.    Electronically Signed: Toya VEAR Cousin, PA-C 02/18/2023, 4:10 PM   I spent a total of 15 Minutes at the patient's bedside AND on the patient's hospital floor or unit, greater than 50% of which was counseling/coordinating care for occluded right posterior cerebral artery distal P2 segment s/p thrombectomy

## 2023-02-18 NOTE — Progress Notes (Signed)
 Physical Therapy Treatment Patient Details Name: Edward Rodgers MRN: [id number] DOB: March 12, 1950 Today's Date: 02/18/2023   History of Present Illness Patient is a 73 y/o male admitted 02/14/23 due to L side weakness, numbness and dysconjugate gaze, R gaze preference and inconsistent L visual field testing.  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 Intermittent improvements and worsening in NIHSS and noted increase in prenumbra to 36 so decided to take for thrombectomy R PCA distal P2 segment.  PMH positive for CHF, gout, HTN, thyroid  disease, Prostate CA and prior CVA with residual diplopia.    PT Comments  Pt resting in bed on arrival and agreeable to session with continued progress towards acute goals. Mirror utilized throughout session for visual feedback for pt to self correct posture with fair return. Physically pt requiring mod A to come to sit EOB and mod A +2 to boost to stand x3 trials with HHA x2. Max cues needed with visual feedback to shift weight laterally to R as pt with continued L lateral lean. Pt participating in seated LE therex with total A needed for seated LLE LAQ and hip flexion due to weakness.  Current plan remains appropriate to address deficits and maximize functional independence and decrease caregiver burden. Pt continues to benefit from skilled PT services to progress toward functional mobility goals.      If plan is discharge home, recommend the following: Two people to help with walking and/or transfers;Two people to help with bathing/dressing/bathroom;Help with stairs or ramp for entrance;Assist for transportation;Direct supervision/assist for financial management;Assistance with cooking/housework;Supervision due to cognitive status   Can travel by private vehicle        Equipment Recommendations  None recommended by PT    Recommendations for Other Services       Precautions / Restrictions Precautions Precautions: Fall Precaution  Comments: pusher L; glasses, some L lower field cut (pt endorses double vision even prior to CVA) Restrictions Weight Bearing Restrictions Per Provider Order: No     Mobility  Bed Mobility Overal bed mobility: Needs Assistance Bed Mobility: Supine to Sit, Sit to Supine     Supine to sit: Mod assist Sit to supine: Max assist   General bed mobility comments: assist for trunk elevation/lowering, LLE progression to/from EOB, bringing LUE into lap when moving to EOB, scooting to EOB with assist of bed pad.    Transfers Overall transfer level: Needs assistance Equipment used: 2 person hand held assist Transfers: Sit to/from Stand, Bed to chair/wheelchair/BSC Sit to Stand: Mod assist, +2 physical assistance           General transfer comment: mod A +2 to boost to stand and steady on rise, max multimodal cues and asssit to weight shift with visual feedback from mirror, x1 stand with LLE on tri folded fall mat to encouage R lateral weight shift, L knee blocked with all 3 stands    Ambulation/Gait               General Gait Details: unable   Stairs             Wheelchair Mobility     Tilt Bed    Modified Rankin (Stroke Patients Only) Modified Rankin (Stroke Patients Only) Pre-Morbid Rankin Score: No significant disability Modified Rankin: Severe disability     Balance Overall balance assessment: Needs assistance Sitting-balance support: Single extremity supported, Feet supported Sitting balance-Leahy Scale: Fair Sitting balance - Comments: initially poor, but improving to sitting unsupported with  feedback of mirror to correct posture to upright. mod cuing for placing LUE in lap to prevent pushing Postural control: Left lateral lean Standing balance support: Bilateral upper extremity supported Standing balance-Leahy Scale: Zero Standing balance comment: max A                            Cognition Arousal: Alert Behavior During Therapy: WFL for  tasks assessed/performed Overall Cognitive Status: Impaired/Different from baseline Area of Impairment: Attention, Safety/judgement, Orientation                 Orientation Level: Disoriented to, Place Current Attention Level: Sustained     Safety/Judgement: Decreased awareness of deficits, Decreased awareness of safety     General Comments: requires step-by-step cuing for sequencing mobility, L inattention        Exercises      General Comments General comments (skin integrity, edema, etc.): VSS on RA, pt brother present and supportive      Pertinent Vitals/Pain Pain Assessment Pain Assessment: No/denies pain    Home Living                          Prior Function            PT Goals (current goals can now be found in the care plan section) Acute Rehab PT Goals Patient Stated Goal: return home PT Goal Formulation: With patient Time For Goal Achievement: 03/01/23 Progress towards PT goals: Progressing toward goals    Frequency    Min 1X/week      PT Plan      Co-evaluation              AM-PAC PT 6 Clicks Mobility   Outcome Measure  Help needed turning from your back to your side while in a flat bed without using bedrails?: A Lot Help needed moving from lying on your back to sitting on the side of a flat bed without using bedrails?: A Lot Help needed moving to and from a bed to a chair (including a wheelchair)?: Total Help needed standing up from a chair using your arms (e.g., wheelchair or bedside chair)?: Total Help needed to walk in hospital room?: Total Help needed climbing 3-5 steps with a railing? : Total 6 Click Score: 8    End of Session Equipment Utilized During Treatment: Gait belt Activity Tolerance: Patient tolerated treatment well Patient left: with call bell/phone within reach;in bed;with bed alarm set Nurse Communication: Mobility status PT Visit Diagnosis: Other abnormalities of gait and mobility (R26.89);Other  symptoms and signs involving the nervous system (R29.898);Hemiplegia and hemiparesis Hemiplegia - Right/Left: Left Hemiplegia - dominant/non-dominant: Non-dominant Hemiplegia - caused by: Cerebral infarction     Time: 0932-1000 PT Time Calculation (min) (ACUTE ONLY): 28 min  Charges:    $Gait Training: 8-22 mins $Therapeutic Exercise: 8-22 mins PT General Charges $$ ACUTE PT VISIT: 1 Visit                     Erique Kaser R. PTA Acute Rehabilitation Services Office: [phone]   Therisa CHRISTELLA Boor 02/18/2023, 1:20 PM

## 2023-02-18 NOTE — Plan of Care (Signed)
  Problem: Education: Goal: Knowledge of disease or condition will improve Outcome: Progressing Goal: Knowledge of secondary prevention will improve (MUST DOCUMENT ALL) Outcome: Progressing Goal: Knowledge of patient specific risk factors will improve (DELETE if not current risk factor) Outcome: Progressing   Problem: Ischemic Stroke/TIA Tissue Perfusion: Goal: Complications of ischemic stroke/TIA will be minimized Outcome: Progressing   Problem: Coping: Goal: Will verbalize positive feelings about self Outcome: Progressing Goal: Will identify appropriate support needs Outcome: Progressing   Problem: Health Behavior/Discharge Planning: Goal: Ability to manage health-related needs will improve Outcome: Progressing Goal: Goals will be collaboratively established with patient/family Outcome: Progressing   Problem: Self-Care: Goal: Ability to participate in self-care as condition permits will improve Outcome: Progressing Goal: Verbalization of feelings and concerns over difficulty with self-care will improve Outcome: Progressing Goal: Ability to communicate needs accurately will improve Outcome: Progressing   Problem: Nutrition: Goal: Risk of aspiration will decrease Outcome: Progressing Goal: Dietary intake will improve Outcome: Progressing   Problem: Education: Goal: Knowledge of General Education information will improve Description: Including pain rating scale, medication(s)/side effects and non-pharmacologic comfort measures Outcome: Progressing   Problem: Health Behavior/Discharge Planning: Goal: Ability to manage health-related needs will improve Outcome: Progressing   Problem: Clinical Measurements: Goal: Ability to maintain clinical measurements within normal limits will improve Outcome: Progressing Goal: Will remain free from infection Outcome: Progressing Goal: Diagnostic test results will improve Outcome: Progressing Goal: Respiratory complications will  improve Outcome: Progressing Goal: Cardiovascular complication will be avoided Outcome: Progressing   Problem: Activity: Goal: Risk for activity intolerance will decrease Outcome: Progressing   Problem: Nutrition: Goal: Adequate nutrition will be maintained Outcome: Progressing   Problem: Coping: Goal: Level of anxiety will decrease Outcome: Progressing   Problem: Elimination: Goal: Will not experience complications related to bowel motility Outcome: Progressing Goal: Will not experience complications related to urinary retention Outcome: Progressing   Problem: Pain Managment: Goal: General experience of comfort will improve and/or be controlled Outcome: Progressing   Problem: Safety: Goal: Ability to remain free from injury will improve Outcome: Progressing   Problem: Skin Integrity: Goal: Risk for impaired skin integrity will decrease Outcome: Progressing   Problem: Education: Goal: Understanding of CV disease, CV risk reduction, and recovery process will improve Outcome: Progressing Goal: Individualized Educational Video(s) Outcome: Progressing   Problem: Activity: Goal: Ability to return to baseline activity level will improve Outcome: Progressing   Problem: Cardiovascular: Goal: Ability to achieve and maintain adequate cardiovascular perfusion will improve Outcome: Progressing Goal: Vascular access site(s) Level 0-1 will be maintained Outcome: Progressing   Problem: Health Behavior/Discharge Planning: Goal: Ability to safely manage health-related needs after discharge will improve Outcome: Progressing   Problem: Education: Goal: Knowledge of disease or condition will improve Outcome: Progressing Goal: Knowledge of secondary prevention will improve (MUST DOCUMENT ALL) Outcome: Progressing Goal: Knowledge of patient specific risk factors will improve (DELETE if not current risk factor) Outcome: Progressing

## 2023-02-18 NOTE — Progress Notes (Signed)
 Inpatient Rehabilitation Admissions Coordinator   Notified by [facility] medicare of need for peer to peer request to be completed by 12 noon tomorrow. I will arrange rehab MD to complete this in the am.  Heron Leavell, RN, MSN Rehab Admissions Coordinator [phone] 02/18/2023 6:05 PM

## 2023-02-18 NOTE — Care Management Important Message (Signed)
 Important Message  Patient Details  Name: Edward Rodgers MRN: [id number] Date of Birth: April 09, 1950   Important Message Given:  Yes - Medicare IM     Wynonia Hedges 02/18/2023, 10:48 AM

## 2023-02-19 DIAGNOSIS — I63531 Cerebral infarction due to unspecified occlusion or stenosis of right posterior cerebral artery: Secondary | ICD-10-CM | POA: Diagnosis not present

## 2023-02-19 LAB — CBC
HCT: 41.1 % (ref 39.0–52.0)
Hemoglobin: 13 g/dL (ref 13.0–17.0)
MCH: 28 pg (ref 26.0–34.0)
MCHC: 31.6 g/dL (ref 30.0–36.0)
MCV: 88.4 fL (ref 80.0–100.0)
Platelets: 181 10*3/uL (ref 150–400)
RBC: 4.65 MIL/uL (ref 4.22–5.81)
RDW: 14.6 % (ref 11.5–15.5)
WBC: 5.8 10*3/uL (ref 4.0–10.5)
nRBC: 0 % (ref 0.0–0.2)

## 2023-02-19 LAB — BASIC METABOLIC PANEL WITH GFR
Anion gap: 10 (ref 5–15)
BUN: 16 mg/dL (ref 8–23)
CO2: 23 mmol/L (ref 22–32)
Calcium: 9.7 mg/dL (ref 8.9–10.3)
Chloride: 103 mmol/L (ref 98–111)
Creatinine, Ser: 1.23 mg/dL (ref 0.61–1.24)
GFR, Estimated: >60 mL/min
Glucose, Bld: 106 mg/dL — ABNORMAL HIGH (ref 70–99)
Potassium: 4.2 mmol/L (ref 3.5–5.1)
Sodium: 136 mmol/L (ref 135–145)

## 2023-02-19 MED ORDER — SALINE SPRAY 0.65 % NA SOLN
1.0000 | NASAL | Status: DC | PRN
  Filled 2023-02-19: qty 44

## 2023-02-19 NOTE — Plan of Care (Signed)
 Problem: Education: Goal: Knowledge of disease or condition will improve 02/19/2023 0442 by Jori Roderic CROME, RN Outcome: Progressing 02/18/2023 2202 by Jori Roderic CROME, RN Outcome: Progressing Goal: Knowledge of secondary prevention will improve (MUST DOCUMENT ALL) 02/19/2023 0442 by Jori Roderic CROME, RN Outcome: Progressing 02/18/2023 2202 by Jori Roderic CROME, RN Outcome: Progressing Goal: Knowledge of patient specific risk factors will improve (DELETE if not current risk factor) 02/19/2023 0442 by Jori Roderic CROME, RN Outcome: Progressing 02/18/2023 2202 by Jori Roderic CROME, RN Outcome: Progressing   Problem: Ischemic Stroke/TIA Tissue Perfusion: Goal: Complications of ischemic stroke/TIA will be minimized 02/19/2023 0442 by Jori Roderic CROME, RN Outcome: Progressing 02/18/2023 2202 by Jori Roderic CROME, RN Outcome: Progressing   Problem: Coping: Goal: Will verbalize positive feelings about self 02/19/2023 0442 by Jori Roderic CROME, RN Outcome: Progressing 02/18/2023 2202 by Jori Roderic CROME, RN Outcome: Progressing Goal: Will identify appropriate support needs 02/19/2023 0442 by Jori Roderic CROME, RN Outcome: Progressing 02/18/2023 2202 by Jori Roderic CROME, RN Outcome: Progressing   Problem: Health Behavior/Discharge Planning: Goal: Ability to manage health-related needs will improve 02/19/2023 0442 by Jori Roderic CROME, RN Outcome: Progressing 02/18/2023 2202 by Jori Roderic CROME, RN Outcome: Progressing Goal: Goals will be collaboratively established with patient/family 02/19/2023 0442 by Jori Roderic CROME, RN Outcome: Progressing 02/18/2023 2202 by Jori Roderic CROME, RN Outcome: Progressing   Problem: Self-Care: Goal: Ability to participate in self-care as condition permits will improve 02/19/2023 0442 by Jori Roderic CROME, RN Outcome: Progressing 02/18/2023 2202 by Jori Roderic CROME, RN Outcome: Progressing Goal: Verbalization  of feelings and concerns over difficulty with self-care will improve 02/19/2023 0442 by Jori Roderic CROME, RN Outcome: Progressing 02/18/2023 2202 by Jori Roderic CROME, RN Outcome: Progressing Goal: Ability to communicate needs accurately will improve 02/19/2023 0442 by Jori Roderic CROME, RN Outcome: Progressing 02/18/2023 2202 by Jori Roderic CROME, RN Outcome: Progressing   Problem: Nutrition: Goal: Risk of aspiration will decrease 02/19/2023 0442 by Jori Roderic CROME, RN Outcome: Progressing 02/18/2023 2202 by Jori Roderic CROME, RN Outcome: Progressing Goal: Dietary intake will improve 02/19/2023 0442 by Jori Roderic CROME, RN Outcome: Progressing 02/18/2023 2202 by Jori Roderic CROME, RN Outcome: Progressing   Problem: Education: Goal: Knowledge of General Education information will improve Description: Including pain rating scale, medication(s)/side effects and non-pharmacologic comfort measures 02/19/2023 0442 by Jori Roderic CROME, RN Outcome: Progressing 02/18/2023 2202 by Jori Roderic CROME, RN Outcome: Progressing   Problem: Health Behavior/Discharge Planning: Goal: Ability to manage health-related needs will improve 02/19/2023 0442 by Jori Roderic CROME, RN Outcome: Progressing 02/18/2023 2202 by Jori Roderic CROME, RN Outcome: Progressing   Problem: Clinical Measurements: Goal: Ability to maintain clinical measurements within normal limits will improve 02/19/2023 0442 by Jori Roderic CROME, RN Outcome: Progressing 02/18/2023 2202 by Jori Roderic CROME, RN Outcome: Progressing Goal: Will remain free from infection 02/19/2023 0442 by Jori Roderic CROME, RN Outcome: Progressing 02/18/2023 2202 by Jori Roderic CROME, RN Outcome: Progressing Goal: Diagnostic test results will improve 02/19/2023 0442 by Jori Roderic CROME, RN Outcome: Progressing 02/18/2023 2202 by Jori Roderic CROME, RN Outcome: Progressing Goal: Respiratory complications will  improve 02/19/2023 0442 by Jori Roderic CROME, RN Outcome: Progressing 02/18/2023 2202 by Jori Roderic CROME, RN Outcome: Progressing Goal: Cardiovascular complication will be avoided 02/19/2023 0442 by Jori Roderic CROME, RN Outcome: Progressing 02/18/2023 2202 by Jori Roderic CROME, RN Outcome: Progressing   Problem: Activity: Goal: Risk for activity intolerance will decrease 02/19/2023 0442 by Jori Roderic CROME, RN Outcome: Progressing 02/18/2023 2202 by  Jori Roderic CROME, RN Outcome: Progressing   Problem: Nutrition: Goal: Adequate nutrition will be maintained 02/19/2023 0442 by Jori Roderic CROME, RN Outcome: Progressing 02/18/2023 2202 by Jori Roderic CROME, RN Outcome: Progressing   Problem: Coping: Goal: Level of anxiety will decrease 02/19/2023 0442 by Jori Roderic CROME, RN Outcome: Progressing 02/18/2023 2202 by Jori Roderic CROME, RN Outcome: Progressing   Problem: Elimination: Goal: Will not experience complications related to bowel motility 02/19/2023 0442 by Jori Roderic CROME, RN Outcome: Progressing 02/18/2023 2202 by Jori Roderic CROME, RN Outcome: Progressing Goal: Will not experience complications related to urinary retention 02/19/2023 0442 by Jori Roderic CROME, RN Outcome: Progressing 02/18/2023 2202 by Jori Roderic CROME, RN Outcome: Progressing   Problem: Pain Managment: Goal: General experience of comfort will improve and/or be controlled 02/19/2023 0442 by Jori Roderic CROME, RN Outcome: Progressing 02/18/2023 2202 by Jori Roderic CROME, RN Outcome: Progressing   Problem: Safety: Goal: Ability to remain free from injury will improve 02/19/2023 0442 by Jori Roderic CROME, RN Outcome: Progressing 02/18/2023 2202 by Jori Roderic CROME, RN Outcome: Progressing   Problem: Skin Integrity: Goal: Risk for impaired skin integrity will decrease 02/19/2023 0442 by Jori Roderic CROME, RN Outcome: Progressing 02/18/2023 2202 by Jori Roderic CROME, RN Outcome: Progressing   Problem: Education: Goal: Understanding of CV disease, CV risk reduction, and recovery process will improve 02/19/2023 0442 by Jori Roderic CROME, RN Outcome: Progressing 02/18/2023 2202 by Jori Roderic CROME, RN Outcome: Progressing Goal: Individualized Educational Video(s) 02/19/2023 0442 by Jori Roderic CROME, RN Outcome: Progressing 02/18/2023 2202 by Jori Roderic CROME, RN Outcome: Progressing   Problem: Activity: Goal: Ability to return to baseline activity level will improve 02/19/2023 0442 by Jori Roderic CROME, RN Outcome: Progressing 02/18/2023 2202 by Jori Roderic CROME, RN Outcome: Progressing   Problem: Cardiovascular: Goal: Ability to achieve and maintain adequate cardiovascular perfusion will improve 02/19/2023 0442 by Jori Roderic CROME, RN Outcome: Progressing 02/18/2023 2202 by Jori Roderic CROME, RN Outcome: Progressing Goal: Vascular access site(s) Level 0-1 will be maintained 02/19/2023 0442 by Jori Roderic CROME, RN Outcome: Progressing 02/18/2023 2202 by Jori Roderic CROME, RN Outcome: Progressing   Problem: Health Behavior/Discharge Planning: Goal: Ability to safely manage health-related needs after discharge will improve 02/19/2023 0442 by Jori Roderic CROME, RN Outcome: Progressing 02/18/2023 2202 by Jori Roderic CROME, RN Outcome: Progressing   Problem: Education: Goal: Knowledge of disease or condition will improve 02/19/2023 0442 by Jori Roderic CROME, RN Outcome: Progressing 02/18/2023 2202 by Jori Roderic CROME, RN Outcome: Progressing Goal: Knowledge of secondary prevention will improve (MUST DOCUMENT ALL) 02/19/2023 0442 by Jori Roderic CROME, RN Outcome: Progressing 02/18/2023 2202 by Jori Roderic CROME, RN Outcome: Progressing Goal: Knowledge of patient specific risk factors will improve (DELETE if not current risk factor) 02/19/2023 0442 by Jori Roderic CROME, RN Outcome:  Progressing 02/18/2023 2202 by Jori Roderic CROME, RN Outcome: Progressing

## 2023-02-19 NOTE — Progress Notes (Addendum)
 STROKE TEAM PROGRESS NOTE   INTERIM HISTORY/SUBJECTIVE Patient remains afebrile with stable vital signs.  He has received insurance approval for CIR and will hopefully obtain a bed on Saturday.  OBJECTIVE  CBC    Component Value Date/Time   WBC 5.8 02/19/2023 0537   RBC 4.65 02/19/2023 0537   HGB 13.0 02/19/2023 0537   HCT 41.1 02/19/2023 0537   PLT 181 02/19/2023 0537   MCV 88.4 02/19/2023 0537   MCH 28.0 02/19/2023 0537   MCHC 31.6 02/19/2023 0537   RDW 14.6 02/19/2023 0537   LYMPHSABS 1.2 02/14/2023 0406   MONOABS 0.7 02/14/2023 0406   EOSABS 0.2 02/14/2023 0406   BASOSABS 0.0 02/14/2023 0406    BMET    Component Value Date/Time   NA 136 02/19/2023 0537   K 4.2 02/19/2023 0537   CL 103 02/19/2023 0537   CO2 23 02/19/2023 0537   GLUCOSE 106 (H) 02/19/2023 0537   BUN 16 02/19/2023 0537   CREATININE 1.23 02/19/2023 0537   CALCIUM  9.7 02/19/2023 0537   GFRNONAA >60 02/19/2023 0537    IMAGING past 24 hours No results found.   Vitals:   02/19/23 0000 02/19/23 0430 02/19/23 0805 02/19/23 1234  BP: 126/66 (!) 142/98 (!) 150/97 (!) 145/97  Pulse: 81 76 77 86  Resp: 16 18 18 18   Temp: 98.3 F (36.8 C) 99.6 F (37.6 C) 98.6 F (37 C) 99.1 F (37.3 C)  TempSrc: Oral Oral Oral Oral  SpO2: 93% 97%    Weight:      Height:         PHYSICAL EXAM General:  Alert, well-nourished, well-developed patient in no acute distress Psych:  Mood and affect appropriate for situation CV: Regular rate and rhythm on monitor Respiratory:  Regular, unlabored respirations on room air GI: Abdomen soft and nontender   NEURO:  Mental Status: AA&Ox3  Speech/Language: speech is without dysarthria or aphasia.  Cranial Nerves:  II: PERRL. III, IV, VI: EOMI. Eyelids elevate symmetrically.  V: Sensation is intact to light touch and symmetrical to face.  VII: Smile is symmetrical.  VIII: hearing intact to voice. IX, X: Phonation is normal.  XII: tongue is midline without  fasciculations. Motor: 5 out of 5 strength to right upper and lower extremities, able to move fingers of left hand only, unable to lift off the bed, 3/5 strength in left lower extremity Tone: is normal and bulk is normal Sensation- Intact to light touch bilaterally.  Coordination: FTN intact on the right Gait- deferred    ASSESSMENT/PLAN [name] is a 73 y.o. male withPMH significant for HTN, prior cerebellar stroke, hypothyroidism who was BIB EMS as a CODE STROKE 2/1 early AM due to left-sided weakness, numbness, dysconjugate gaze, slight R gaze preference and inconsistent left visual field testing. Per EMS, wife stated his LKW was around 0115.  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 Thrombectomy was discussed by neurologist Dr. Vanessa, IR Dr. Dolphus and family who consented. However, patient improved from an NIH of 11 to an NIH of 3 en route to IR suite and it was felt that deficits were too small to intervene, but appropriate for TNKase .  STAT MRI showed diffusion restriction in R medial temporal with no signal change on FLAIR. At that time, NIH was fluctuating from 3-5 due to L arm and leg weakness fluctuating in intensity.   Stroke:  right PCA scattered infarct with thalamic HT and punctate left  PCA infarcts with R P2 occlusion s/p TNK and IR are with TICI3 repeat fusion, etiology: Concerning for cardioembolic source Code Stroke CT head - No acute abnormality.  3 cm anterior left parafalcine meningioma, enlarged from 2015. CTA head & neck w/Perfusion Right P2 occlusion. Extensive intracranial atherosclerosis at bilateral ICA bulbs and siphons. CTP 0/25cc  MRI Diffusion hyperintensity in the right PCA distribution affecting the medial temporal lobe and thalamus. FLAIR hyperintensity is not yet detected. left anterior parafalcine meningioma. NIHSS 11->3->7 Repeat CTH Subtle but likely cytotoxic edema now seen at areas of right temporal  and thalamic diffusion abnormality Repeat CT Perfusion 0/36 S/p IR with right distal P2 occlusion and TICI3 reperfusion MRI Progressive acute right PCA territory infarct with extension most notable in the right periatrial white matter and occipital lobe. Suspected subcentimeter hemorrhage centrally in the right thalamic infarct. New punctate acute infarcts in the left occipital lobe. 2/3 CT repeat  Involving cytotoxic edema in the right thalamus and right occipital lobe at the site of known infarcts. No acute hemorrhage. Unchanged large left anterior para falcine calcified meningioma. 2D Echo EF 55%, no LV thrombus LE venous Doppler no DVT Plan for loop recorder to rule out A-fib prior to transfer to CIR LDL 30 HgbA1c 6.4 UDS negative VTE prophylaxis -Lovenox  aspirin  325 mg daily prior to admission, now on ASA 81 and plavix  DAPT for 3 weeks and then plavix  alone. Therapy recommendations: CIR Disposition: CIR, plan for Saturday  History of stroke 03/2013 right PICA small infarct, EF 35 to 40%, LDL 112.  Discharged on aspirin  81 and statin.  History of CHF 03/2013 2D echo EF 35 to 40% Home medication including metoprolol , spironolactone  2D echo this admission EF 55% Resumed home metoprolol  and spironolactone  Will consider loop recorder to rule out A-fib on discharge  Hypertension Home meds: Spironolactone  25, toprol -xl 100mg  Cleviprex  gtt off Resumed home metoprolol  and spironolactone  Add amlodipine  10 Labetalol  IV PRN BP goal less than 160 given small HT Monitor BP goal normotensive  Hyperlipidemia Home meds: Lipitor  80 LDL 30, goal < 70 Resume home Lipitor  80 Consider statin on discharge  AKI creatinine 1.20--1.30--0.96 --1.44--1.32-> 1.21-> 1.23 improved Encourage p.o. intake Off IVF @ 50 BMP monitoring  Sundowning Delirium  Likely mild baseline cognitive impairment Resume home Effexor  Increase seroquel  HS from 25 to 50mg  improved  Other Stroke Risk  Factors Obesity, Body mass index is 32.95 kg/m., BMI >/= 30 associated with increased stroke risk, recommend weight loss, diet and exercise as appropriate  Former smoker  Other acute issues Gout, resume allopurinol  Stable left frontal meningioma  Hospital day # 5  Patient seen by NP and then by MD, MD to edit note as needed. Cortney E Everitt Clint Kill , MSN, AGACNP-BC [facility] See Amion for schedule and pager information 02/19/2023 1:49 PM  ATTENDING NOTE: I reviewed above note and agree with the assessment and plan. Pt was seen and examined.   No family at the bedside. Pt lying in bed, AAO x 3, still has left UE 2-/5 and left LE 3/5. Encourage self exercise in bed if possible. On DAPT and statin. Pending CIR. Will do loop prior to dc.   For detailed assessment and plan, please refer to above/below as I have made changes wherever appropriate.   Ary Cummins, MD PhD Stroke Neurology 02/19/2023 10:17 PM    To contact Stroke Continuity provider, please refer to [email or web address]. After hours, contact General Neurology

## 2023-02-19 NOTE — Progress Notes (Addendum)
 Occupational Therapy Treatment Patient Details Name: Edward Rodgers MRN: [id number] DOB: 31-Jan-1950 Today's Date: 02/19/2023   History of present illness Patient is a 73 y/o male admitted 02/14/23  MRI confirmed progressive acute R PCA territory infarct with extension in the right periatrial white matter and occipital lobe.  Suspected subcentimeter hemorrhage centrally in the right thalamic infarct, new punctate acute infarcts in the left occipital lobe. Underwent thrombectomy 02/17/23.  PMH positive for CHF, gout, HTN, thyroid  disease, Prostate CA and prior CVA with residual diplopia.   OT comments  Patient with good ability to participate today.  Able to move to EOB and hold midline with verbal and tactile cues for short periods of time.  LUE with increased tone ( external rotation and finger flexion) with change in position. Has glasses with prisms to assist with double vision as this has been a long time problem, however is worse from PLOF. Patient will benefit from intensive inpatient follow-up therapy, >3 hours/day        If plan is discharge home, recommend the following:  Two people to help with walking and/or transfers;Two people to help with bathing/dressing/bathroom;Assistance with cooking/housework;Assistance with feeding;Direct supervision/assist for medications management;Direct supervision/assist for financial management;Help with stairs or ramp for entrance;Assist for transportation   Equipment Recommendations       Recommendations for Other Services      Precautions / Restrictions Precautions Precautions: Fall Restrictions Weight Bearing Restrictions Per Provider Order: No       Mobility Bed Mobility Overal bed mobility: Needs Assistance Bed Mobility: Rolling, Sidelying to Sit Rolling: Max assist (to pts left) Sidelying to sit: Max assist            Transfers Overall transfer level: Needs assistance                 General transfer comment: +2 max A using  Camie Ip from bed to chair Transfer via Lift Equipment: [employer] Overall balance assessment: Needs assistance Sitting-balance support: Feet supported, Bilateral upper extremity supported Sitting balance-Leahy Scale: Poor   Postural control: Left lateral lean                                 ADL either performed or assessed with clinical judgement   ADL Overall ADL's : Needs assistance/impaired                                     Functional mobility during ADLs: +2 for physical assistance;Maximal assistance (Used steady for transfer from bed to chair)      Extremity/Trunk Assessment Upper Extremity Assessment Upper Extremity Assessment: LUE deficits/detail LUE Deficits / Details: LUE with min increased tone in supine, able to initate movement with AAROM. Performed AAROM to 90 in supine x3 with no complaints of pain.  Patient has gross grassp intermittent with control and no extension noted.  Tone increased to moderate in sitting with pull into shoulder  internal rotation   Lower Extremity Assessment Lower Extremity Assessment: Defer to PT evaluation        Vision   Additional Comments: Patient has diploplia from previous stroke with prism  glasses to correct.  Will need further eval on rehab   Perception Perception Perception: Impaired Preception Impairment Details: Inattention/Neglect Perception-Other Comments: Leaning to his left, but could self correct with min A and min vc.  Able  to hold midline ( weight on l hip) for ~ 15 seconds with slow lean back to l   Praxis      Cognition Arousal: Alert Behavior During Therapy: WFL for tasks assessed/performed                     Orientation Level: Person, Situation, Place Current Attention Level: Sustained     Safety/Judgement: Decreased awareness of deficits, Decreased awareness of safety     General Comments: Patient able to follow one step commands        Exercises       Shoulder Instructions       General Comments      Pertinent Vitals/ Pain       Pain Assessment Pain Assessment: Faces Pain Score: 2  Faces Pain Scale: Hurts a little bit Pain Location: lower back Pain Intervention(s): Repositioned, Monitored during session  Home Living                                          Prior Functioning/Environment              Frequency  Min 1X/week        Progress Toward Goals  OT Goals(current goals can now be found in the care plan section)  Progress towards OT goals: Progressing toward goals  Acute Rehab OT Goals Patient Stated Goal: To go to rehab OT Goal Formulation: With patient Time For Goal Achievement: 03/01/23 Potential to Achieve Goals: Good ADL Goals Pt Will Perform Eating: with supervision;sitting Pt Will Perform Grooming: with supervision;sitting Pt Will Perform Upper Body Dressing: with min assist;sitting Pt Will Transfer to Toilet: with mod assist;ambulating;stand pivot transfer Additional ADL Goal #1: Pt will perform seated ADL witn min cues for midline orientation and visual scanning to locate items needed  Plan      Co-evaluation          OT goals addressed during session: ADL's and self-care;Strengthening/ROM      AM-PAC OT 6 Clicks Daily Activity     Outcome Measure   Help from another person eating meals?: A Lot Help from another person taking care of personal grooming?: A Lot Help from another person toileting, which includes using toliet, bedpan, or urinal?: Total Help from another person bathing (including washing, rinsing, drying)?: A Lot Help from another person to put on and taking off regular upper body clothing?: A Lot Help from another person to put on and taking off regular lower body clothing?: Total 6 Click Score: 10    End of Session Equipment Utilized During Treatment: Gait belt (sara stedy)  OT Visit Diagnosis: Other abnormalities of gait and mobility  (R26.89);Muscle weakness (generalized) (M62.81);Hemiplegia and hemiparesis;Low vision, both eyes (H54.2);Unsteadiness on feet (R26.81) Hemiplegia - dominant/non-dominant: Non-Dominant Hemiplegia - caused by: Cerebral infarction   Activity Tolerance Patient tolerated treatment well   Patient Left in chair;with call bell/phone within reach;with chair alarm set;with family/visitor present   Nurse Communication Mobility status        Time: 8947-8865 OT Time Calculation (min): 42 min  Charges: OT General Charges $OT Visit: 1 Visit OT Treatments $Therapeutic Activity: 8-22 mins $Neuromuscular Re-education: 23-37 mins Edward Rodgers OTR/L   Edward SHAUNNA Rodgers 02/19/2023, 3:34 PM

## 2023-02-19 NOTE — Progress Notes (Addendum)
 Inpatient Rehabilitation Admissions Coordinator   We have received approval for Cir after peer to peer with Dr Babs today. Unlikely to have a bed at CIR before Saturday. I have notified acute team and TOC. Cardiology notified for LOOP placement timing. I will discuss with patient and family.   Heron Leavell, RN, MSN Rehab Admissions Coordinator [phone] 02/19/2023 11:01 AM  I met with patient and wife and they are aware and in agreement.   Heron Leavell, RN, MSN Rehab Admissions Coordinator [phone] 02/19/2023 11:17 AM

## 2023-02-20 ENCOUNTER — Encounter (HOSPITAL_COMMUNITY)
Admission: EM | Disposition: A | Discharge status: Rehabilitation Facility | Payer: Self-pay | Source: Home / Self Care | Attending: Neurology

## 2023-02-20 DIAGNOSIS — I639 Cerebral infarction, unspecified: Secondary | ICD-10-CM | POA: Diagnosis not present

## 2023-02-20 DIAGNOSIS — I63531 Cerebral infarction due to unspecified occlusion or stenosis of right posterior cerebral artery: Secondary | ICD-10-CM | POA: Diagnosis not present

## 2023-02-20 HISTORY — PX: LOOP RECORDER INSERTION: EP1214

## 2023-02-20 SURGERY — LOOP RECORDER INSERTION
Anesthesia: LOCAL

## 2023-02-20 MED ORDER — LIDOCAINE-EPINEPHRINE 1 %-1:100000 IJ SOLN
INTRAMUSCULAR | Status: DC | PRN
  Administered 2023-02-20: 3 mL via INTRADERMAL

## 2023-02-20 MED ORDER — LIDOCAINE-EPINEPHRINE 1 %-1:100000 IJ SOLN
INTRAMUSCULAR | Status: AC
  Filled 2023-02-20: qty 1

## 2023-02-20 MED ORDER — LIDOCAINE 5 % EX PTCH
1.0000 | MEDICATED_PATCH | CUTANEOUS | Status: DC
  Administered 2023-02-20: 1 via TRANSDERMAL
  Filled 2023-02-20: qty 1

## 2023-02-20 SURGICAL SUPPLY — 2 items
MONITOR REVEAL LINQ II (Prosthesis & Implant Heart) IMPLANT
PACK LOOP INSERTION (CUSTOM PROCEDURE TRAY) ×2 IMPLANT

## 2023-02-20 NOTE — Plan of Care (Signed)
  Problem: Education: Goal: Knowledge of disease or condition will improve Outcome: Progressing Goal: Knowledge of secondary prevention will improve (MUST DOCUMENT ALL) Outcome: Progressing Goal: Knowledge of patient specific risk factors will improve (DELETE if not current risk factor) Outcome: Progressing   Problem: Ischemic Stroke/TIA Tissue Perfusion: Goal: Complications of ischemic stroke/TIA will be minimized Outcome: Progressing   Problem: Coping: Goal: Will verbalize positive feelings about self Outcome: Progressing Goal: Will identify appropriate support needs Outcome: Progressing   Problem: Health Behavior/Discharge Planning: Goal: Ability to manage health-related needs will improve Outcome: Progressing Goal: Goals will be collaboratively established with patient/family Outcome: Progressing   Problem: Self-Care: Goal: Ability to participate in self-care as condition permits will improve Outcome: Progressing Goal: Verbalization of feelings and concerns over difficulty with self-care will improve Outcome: Progressing Goal: Ability to communicate needs accurately will improve Outcome: Progressing   Problem: Nutrition: Goal: Risk of aspiration will decrease Outcome: Progressing Goal: Dietary intake will improve Outcome: Progressing   Problem: Education: Goal: Knowledge of General Education information will improve Description: Including pain rating scale, medication(s)/side effects and non-pharmacologic comfort measures Outcome: Progressing   Problem: Health Behavior/Discharge Planning: Goal: Ability to manage health-related needs will improve Outcome: Progressing   Problem: Clinical Measurements: Goal: Ability to maintain clinical measurements within normal limits will improve Outcome: Progressing Goal: Will remain free from infection Outcome: Progressing Goal: Diagnostic test results will improve Outcome: Progressing Goal: Respiratory complications will  improve Outcome: Progressing Goal: Cardiovascular complication will be avoided Outcome: Progressing   Problem: Activity: Goal: Risk for activity intolerance will decrease Outcome: Progressing   Problem: Nutrition: Goal: Adequate nutrition will be maintained Outcome: Progressing   Problem: Coping: Goal: Level of anxiety will decrease Outcome: Progressing   Problem: Elimination: Goal: Will not experience complications related to bowel motility Outcome: Progressing Goal: Will not experience complications related to urinary retention Outcome: Progressing   Problem: Pain Managment: Goal: General experience of comfort will improve and/or be controlled Outcome: Progressing   Problem: Safety: Goal: Ability to remain free from injury will improve Outcome: Progressing   Problem: Skin Integrity: Goal: Risk for impaired skin integrity will decrease Outcome: Progressing   Problem: Education: Goal: Understanding of CV disease, CV risk reduction, and recovery process will improve Outcome: Progressing Goal: Individualized Educational Video(s) Outcome: Progressing   Problem: Activity: Goal: Ability to return to baseline activity level will improve Outcome: Progressing   Problem: Cardiovascular: Goal: Ability to achieve and maintain adequate cardiovascular perfusion will improve Outcome: Progressing Goal: Vascular access site(s) Level 0-1 will be maintained Outcome: Progressing   Problem: Health Behavior/Discharge Planning: Goal: Ability to safely manage health-related needs after discharge will improve Outcome: Progressing   Problem: Education: Goal: Knowledge of disease or condition will improve Outcome: Progressing Goal: Knowledge of secondary prevention will improve (MUST DOCUMENT ALL) Outcome: Progressing Goal: Knowledge of patient specific risk factors will improve (DELETE if not current risk factor) Outcome: Progressing

## 2023-02-20 NOTE — TOC Progression Note (Signed)
 Transition of Care [facility]) - Progression Note    Patient Details  Name: Edward Rodgers MRN: [id number] Date of Birth: [birth year]  Transition of Care [facility]) CM/SW Contact  Andrez JULIANNA George, RN Phone Number: 02/20/2023, 1:39 PM  Clinical Narrative:     Pt has insurance approval for CIR and will d/c to CIR tomorrow.  TOC following.  Expected Discharge Plan: IP Rehab Facility    Expected Discharge Plan and Services                                               Social Determinants of Health (SDOH) Interventions SDOH Screenings   Food Insecurity: Patient Declined (02/15/2023)  Housing: Patient Unable To Answer (02/15/2023)  Transportation Needs: Patient Unable To Answer (02/15/2023)  Utilities: Patient Unable To Answer (02/15/2023)  Social Connections: Patient Unable To Answer (02/15/2023)  Tobacco Use: Medium Risk (10/08/2022)   Received from [facility]    Readmission Risk Interventions     No data to display

## 2023-02-20 NOTE — Discharge Instructions (Signed)

## 2023-02-20 NOTE — Progress Notes (Signed)
 Physical Therapy Treatment Patient Details Name: Edward Rodgers MRN: [id number] DOB: 1950/08/26 Today's Date: 02/20/2023   History of Present Illness Patient is a 73 y/o male admitted 02/14/23  MRI confirmed progressive acute R PCA territory infarct with extension in the right periatrial white matter and occipital lobe.  Suspected subcentimeter hemorrhage centrally in the right thalamic infarct, new punctate acute infarcts in the left occipital lobe. Underwent thrombectomy 04/17/23.  PMH positive for CHF, gout, HTN, thyroid  disease, Prostate CA and prior CVA with residual diplopia.    PT Comments  Pt with continued progress towards acute goals this session, motivated for participation. Pt abe to come to sitting EOB with mod A to manage LLE and assist to elevate trunk to midline. Pt with improved sitting balance able to correct L lean with light Vcs and maintain. Pt requiring max A to boost to stand and pivot toward R to recliner. Pt continues to demonstrate strong L lateral lean in standing needing max cues and blocking at L knee to correct. Current plan remains appropriate to address deficits and maximize functional independence and decrease caregiver burden. Pt continues to benefit from skilled PT services to progress toward functional mobility goals.     If plan is discharge home, recommend the following: Two people to help with walking and/or transfers;Two people to help with bathing/dressing/bathroom;Help with stairs or ramp for entrance;Assist for transportation;Direct supervision/assist for financial management;Assistance with cooking/housework;Supervision due to cognitive status   Can travel by private vehicle        Equipment Recommendations  None recommended by PT    Recommendations for Other Services       Precautions / Restrictions Precautions Precautions: Fall Precaution Comments: pusher L; glasses, some L lower field cut (pt endorses double vision even prior to  CVA) Restrictions Weight Bearing Restrictions Per Provider Order: No     Mobility  Bed Mobility Overal bed mobility: Needs Assistance Bed Mobility: Supine to Sit     Supine to sit: Mod assist     General bed mobility comments: assist for trunk elevation, LLE progression to/from EOB, bringing LUE into lap when moving to EOB, scooting to EOB with assist of bed pad.    Transfers Overall transfer level: Needs assistance Equipment used: 1 person hand held assist Transfers: Sit to/from Stand, Bed to chair/wheelchair/BSC Sit to Stand: Max assist, From elevated surface Stand pivot transfers: Max assist         General transfer comment: max A to stand  with cues to shift weight to R, max A to boost with step by step cues to sequence to pivot toward R to chair    Ambulation/Gait               General Gait Details: unable   Stairs             Wheelchair Mobility     Tilt Bed    Modified Rankin (Stroke Patients Only) Modified Rankin (Stroke Patients Only) Pre-Morbid Rankin Score: No significant disability Modified Rankin: Severe disability     Balance Overall balance assessment: Needs assistance Sitting-balance support: Feet supported, Bilateral upper extremity supported Sitting balance-Leahy Scale: Poor Sitting balance - Comments: initially poor, but improving to sitting unsupported with feedback of mirror to correct posture to upright. mod cuing for placing LUE in lap to prevent pushing Postural control: Left lateral lean Standing balance support: Bilateral upper extremity supported Standing balance-Leahy Scale: Zero Standing balance comment: max A  Cognition Arousal: Alert Behavior During Therapy: WFL for tasks assessed/performed Overall Cognitive Status: Impaired/Different from baseline Area of Impairment: Attention, Safety/judgement                   Current Attention Level: Sustained      Safety/Judgement: Decreased awareness of deficits, Decreased awareness of safety     General Comments: Patient able to follow one step commands        Exercises      General Comments General comments (skin integrity, edema, etc.): VSS on RA, pt bother present and supportive      Pertinent Vitals/Pain Pain Assessment Pain Assessment: Faces Faces Pain Scale: Hurts a little bit Pain Location: neck Pain Descriptors / Indicators: Sore Pain Intervention(s): Premedicated before session, Monitored during session, Limited activity within patient's tolerance    Home Living                          Prior Function            PT Goals (current goals can now be found in the care plan section) Acute Rehab PT Goals Patient Stated Goal: return home PT Goal Formulation: With patient Time For Goal Achievement: 03/01/23 Progress towards PT goals: Progressing toward goals    Frequency    Min 1X/week      PT Plan      Co-evaluation              AM-PAC PT 6 Clicks Mobility   Outcome Measure  Help needed turning from your back to your side while in a flat bed without using bedrails?: A Lot Help needed moving from lying on your back to sitting on the side of a flat bed without using bedrails?: A Lot Help needed moving to and from a bed to a chair (including a wheelchair)?: Total Help needed standing up from a chair using your arms (e.g., wheelchair or bedside chair)?: Total Help needed to walk in hospital room?: Total Help needed climbing 3-5 steps with a railing? : Total 6 Click Score: 8    End of Session Equipment Utilized During Treatment: Gait belt Activity Tolerance: Patient tolerated treatment well Patient left: with call bell/phone within reach;in chair;with chair alarm set Nurse Communication: Mobility status PT Visit Diagnosis: Other abnormalities of gait and mobility (R26.89);Other symptoms and signs involving the nervous system  (R29.898);Hemiplegia and hemiparesis Hemiplegia - Right/Left: Left Hemiplegia - dominant/non-dominant: Non-dominant Hemiplegia - caused by: Cerebral infarction     Time: 9047-8985 PT Time Calculation (min) (ACUTE ONLY): 22 min  Charges:    $Therapeutic Activity: 8-22 mins PT General Charges $$ ACUTE PT VISIT: 1 Visit                     Oneika Simonian R. PTA Acute Rehabilitation Services Office: [phone]   Therisa CHRISTELLA Boor 02/20/2023, 1:14 PM

## 2023-02-20 NOTE — Consult Note (Addendum)
 ELECTROPHYSIOLOGY CONSULT NOTE  Patient ID: Edward Rodgers MRN: [id number], DOB/AGE: [birth year]   Admit date: 02/14/2023 Date of Consult: 02/20/2023  Primary Physician: Freddrick, No Primary Cardiologist: none Reason for Consultation: Cryptogenic stroke ; recommendations regarding Implantable Loop Recorder, requested by Dr. Jerri  History of Present Illness JOANDRY SLAGTER was admitted on 02/14/2023 with L sided weakness and R gaze >> stroke   PMHx includes: HTN, prior stroke, hypothyroidism,   Neurology notes:  right PCA scattered infarct with thalamic HT and punctate left PCA infarcts with R P2 occlusion s/p TNK and IR are with TICI3 repeat fusion, etiology: Concerning for cardioembolic source  IR with right distal P2 occlusion and TICI3 reperfusion .  he has undergone workup for stroke including echocardiogram and carotid angio.  The patient has been monitored on telemetry which has demonstrated sinus rhythm with no arrhythmias.   Neurology has deferred TEE  Echocardiogram this admission demonstrated.   1. No evidence of LV thrombus. Definity  is administered. Left ventricular  ejection fraction, by estimation, is 55%. The left ventricle has normal  function. The left ventricle has no regional wall motion abnormalities.  Left ventricular diastolic  parameters are consistent with Grade I diastolic dysfunction (impaired  relaxation).   2. Right ventricular systolic function was not well visualized. The right  ventricular size is not well visualized. Tricuspid regurgitation signal is  inadequate for assessing PA pressure.   3. The mitral valve is normal in structure. No evidence of mitral valve  regurgitation. No evidence of mitral stenosis.   4. The aortic valve was not well visualized. Aortic valve regurgitation  is not visualized. No aortic stenosis is present.   5. Aortic dilatation noted. There is borderline dilatation of the aortic  root, measuring 36 mm. There is mild dilatation of the  ascending aorta,  measuring 40 mm.    Lab work is reviewed.  Prior to admission, the patient denies chest pain, shortness of breath, dizziness, palpitations, or syncope.  They are recovering from their stroke with plans to CIR at discharge/planned for over the weekend.      Past Medical History:  Diagnosis Date   Cancer (HCC) 2002   prostate   CHF (congestive heart failure) (HCC)    Gout    Hypertension    Thyroid  disease      Surgical History:  Past Surgical History:  Procedure Laterality Date   COLONOSCOPY N/A 07/15/2017   Procedure: COLONOSCOPY;  Surgeon: Golda Claudis PENNER, MD;  Location: [facility];  Service: Endoscopy;  Laterality: N/A;  830   IR CT HEAD LTD  02/14/2023   IR PERCUTANEOUS ART THROMBECTOMY/INFUSION INTRACRANIAL INC DIAG ANGIO  02/14/2023   POLYPECTOMY  07/15/2017   Procedure: POLYPECTOMY;  Surgeon: Golda Claudis PENNER, MD;  Location: [facility];  Service: Endoscopy;;  colon   PROSTATE SURGERY     RADIOLOGY WITH ANESTHESIA Left 02/14/2023   Procedure: IR WITH ANESTHESIA;  Surgeon: Radiologist, Medication, MD;  Location: [facility];  Service: Radiology;  Laterality: Left;   RADIOLOGY WITH ANESTHESIA N/A 02/14/2023   Procedure: IR WITH ANESTHESIA;  Surgeon: Radiologist, Medication, MD;  Location: [facility] OR;  Service: Radiology;  Laterality: N/A;     Medications Prior to Admission  Medication Sig Dispense Refill Last Dose/Taking   alendronate (FOSAMAX) 70 MG tablet Take 70 mg by mouth once a week. Take with a full glass of water  on an empty stomach. mondays   Past Week   allopurinol  (ZYLOPRIM ) 300 MG  tablet Take 300 mg by mouth every morning.   02/13/2023   aspirin  325 MG tablet Take 1 tablet (325 mg total) by mouth daily. 30 tablet 5 02/13/2023   atorvastatin  (LIPITOR ) 80 MG tablet Take 80 mg by mouth daily.   02/13/2023   Calcium  Carb-Cholecalciferol  (CALCIUM  600 + D PO) Take 1 tablet by mouth in the morning and at bedtime.   02/13/2023   empagliflozin (JARDIANCE) 25 MG TABS  tablet Take 12.5 mg by mouth daily.   02/13/2023   latanoprost  (XALATAN ) 0.005 % ophthalmic solution Place 1 drop into the right eye at bedtime.   02/13/2023   levothyroxine  (SYNTHROID ) 88 MCG tablet Take 88 mcg by mouth daily before breakfast.   02/13/2023   meloxicam  (MOBIC ) 7.5 MG tablet Take 7.5 mg by mouth daily.   02/13/2023   metoprolol  succinate (TOPROL -XL) 100 MG 24 hr tablet Take 100 mg by mouth daily. Take with or immediately following a meal.   02/13/2023   mirabegron  ER (MYRBETRIQ ) 50 MG TB24 tablet Take 50 mg by mouth daily.   02/13/2023   spironolactone  (ALDACTONE ) 25 MG tablet Take 25 mg by mouth daily.   02/13/2023   venlafaxine  XR (EFFEXOR -XR) 150 MG 24 hr capsule Take 150 mg by mouth every evening.   02/13/2023    Inpatient Medications:   allopurinol   300 mg Oral q morning   amLODipine   10 mg Oral Daily   aspirin  EC  81 mg Oral Daily   atorvastatin   80 mg Oral Daily   calcium -vitamin D  1 tablet Oral Daily   Chlorhexidine  Gluconate Cloth  6 each Topical Daily   clopidogrel   75 mg Oral Daily   feeding supplement  237 mL Oral BID BM   heparin  injection (subcutaneous)  5,000 Units Subcutaneous Q8H   latanoprost   1 drop Right Eye QHS   levothyroxine   88 mcg Oral QAC breakfast   lidocaine   1 patch Transdermal Q24H   metoprolol  succinate  100 mg Oral Daily   mirabegron  ER  50 mg Oral Daily   polyethylene glycol  17 g Oral Daily   QUEtiapine   50 mg Oral QHS   spironolactone   25 mg Oral Daily   venlafaxine  XR  150 mg Oral QPM    Allergies: No Known Allergies  Social History   Socioeconomic History   Marital status: Married    Spouse name: Not on file   Number of children: Not on file   Years of education: Not on file   Highest education level: Not on file  Occupational History   Not on file  Tobacco Use   Smoking status: Former   Smokeless tobacco: Never  Vaping Use   Vaping status: Never Used  Substance and Sexual Activity   Alcohol use: No   Drug use: No    Sexual activity: Not on file  Other Topics Concern   Not on file  Social History Narrative   Not on file   Social Drivers of Health   Financial Resource Strain: Not on file  Food Insecurity: Patient Declined (02/15/2023)   Hunger Vital Sign    Worried About Running Out of Food in the Last Year: Patient declined    Ran Out of Food in the Last Year: Patient declined  Transportation Needs: Patient Unable To Answer (02/15/2023)   PRAPARE - Transportation    Lack of Transportation (Medical): Patient unable to answer    Lack of Transportation (Non-Medical): Patient unable to answer  Physical Activity: Not on  file  Stress: Not on file  Social Connections: Patient Unable To Answer (02/15/2023)   Social Connection and Isolation Panel [NHANES]    Frequency of Communication with Friends and Family: Patient unable to answer    Frequency of Social Gatherings with Friends and Family: Patient unable to answer    Attends Religious Services: Patient unable to answer    Active Member of Clubs or Organizations: Patient unable to answer    Attends Banker Meetings: Patient unable to answer    Marital Status: Patient unable to answer  Intimate Partner Violence: Patient Unable To Answer (02/15/2023)   Humiliation, Afraid, Rape, and Kick questionnaire    Fear of Current or Ex-Partner: Patient unable to answer    Emotionally Abused: Patient unable to answer    Physically Abused: Patient unable to answer    Sexually Abused: Patient unable to answer     No family history on file.    Review of Systems: All other systems reviewed and are otherwise negative except as noted above.  Physical Exam: Vitals:   02/19/23 1516 02/19/23 2003 02/19/23 2355 02/20/23 0343  BP: (!) 143/91 123/64 (!) 140/73 (!) 136/103  Pulse: 73 75 84 70  Resp: 18 18 18 18   Temp: 98.9 F (37.2 C) 98.2 F (36.8 C) (!) 97.4 F (36.3 C) 97.8 F (36.6 C)  TempSrc: Oral Oral Oral Oral  SpO2: 98% 93% 94% 91%  Weight:       Height:         GEN- The patient is well appearing, alert and oriented x 3 today.   Head- normocephalic, atraumatic Eyes-  Sclera clear, conjunctiva pink Ears- hearing intact Oropharynx- clear Neck- supple Lungs- CTA b/l, normal work of breathing Heart- RRR, no murmurs, rubs or gallops  GI- soft, NT, ND Extremities- no clubbing, cyanosis, or edema MS- no significant deformity or atrophy Skin- no rash or lesion Psych- euthymic mood, full affect   Labs:   Lab Results  Component Value Date   WBC 5.8 02/19/2023   HGB 13.0 02/19/2023   HCT 41.1 02/19/2023   MCV 88.4 02/19/2023   PLT 181 02/19/2023    Recent Labs  Lab 02/14/23 0406 02/14/23 0407 02/19/23 0537  NA 137   < > 136  K 4.0   < > 4.2  CL 105   < > 103  CO2 22   < > 23  BUN 24*   < > 16  CREATININE 1.20   < > 1.23  CALCIUM  9.4   < > 9.7  PROT 6.8  --   --   BILITOT 0.2  --   --   ALKPHOS 46  --   --   ALT 19  --   --   AST 19  --   --   GLUCOSE 115*   < > 106*   < > = values in this interval not displayed.   Lab Results  Component Value Date   TROPONINI <0.30 03/29/2013   Lab Results  Component Value Date   CHOL 96 02/15/2023   CHOL 178 03/30/2013   Lab Results  Component Value Date   HDL 37 (L) 02/15/2023   HDL 46 03/30/2013   Lab Results  Component Value Date   LDLCALC 30 02/15/2023   LDLCALC 112 (H) 03/30/2013   Lab Results  Component Value Date   TRIG 146 02/15/2023   TRIG 101 03/30/2013   Lab Results  Component Value Date   CHOLHDL 2.6  02/15/2023   CHOLHDL 3.9 03/30/2013   No results found for: LDLDIRECT  No results found for: DDIMER   Radiology/Studies: VAS US  LOWER EXTREMITY VENOUS (DVT) Result Date: 02/17/2023  Lower Venous DVT Study Patient Name:  VALDEMAR MCCLENAHAN  Date of Exam:   02/17/2023 Medical Rec #: [id number]       Accession #:    [id number] Date of Birth: November 18, 1950       Patient Gender: M Patient Age:   46 years Exam Location:  [facility] Procedure:       VAS US  LOWER EXTREMITY VENOUS (DVT) Referring Phys: ARY CUMMINS --------------------------------------------------------------------------------  Indications: Stroke, and Embolic stroke.  Comparison Study: No prior exam. Performing Technologist: Edilia Elden Appl  Examination Guidelines: A complete evaluation includes B-mode imaging, spectral Doppler, color Doppler, and power Doppler as needed of all accessible portions of each vessel. Bilateral testing is considered an integral part of a complete examination. Limited examinations for reoccurring indications may be performed as noted. The reflux portion of the exam is performed with the patient in reverse Trendelenburg.  Summary: BILATERAL: - No evidence of deep vein thrombosis seen in the lower extremities, bilaterally. -No evidence of popliteal cyst, bilaterally.   *See table(s) above for measurements and observations. Electronically signed by Norman Serve on 02/17/2023 at 1:52:27 PM.    Final    IR PERCUTANEOUS ART THROMBECTOMY/INFUSION INTRACRANIAL INC DIAG ANGIO Result Date: 02/17/2023 INDICATION: Probable recent left-sided weakness with numbness.  Dysarthria. Occluded right posterior cerebral artery P2 segment on CT angiogram of the head and neck. EXAM: 1. EMERGENT LARGE VESSEL OCCLUSION THROMBOLYSIS (POSTERIOR CIRCULATION) COMPARISON:  CT angiogram of the head and neck 02/14/2023. MEDICATIONS: No antibiotic was administered within 1 hour of the procedure. ANESTHESIA/SEDATION: General anesthesia. CONTRAST:  Omnipaque  300 approximately 35 mL. FLUOROSCOPY TIME:  Fluoroscopy Time: 26 minutes 18 seconds (1112 mGy). COMPLICATIONS: None immediate. TECHNIQUE: Following a full explanation of the procedure along with the potential associated complications, an informed witnessed consent was obtained. The risks of intracranial hemorrhage of 10%, worsening neurological deficit, ventilator dependency, death and inability to revascularize were all reviewed in detail with  the patient's spouse. The patient was then put under general anesthesia by the Department of Anesthesiology at [facility]. The right groin was prepped and draped in the usual sterile fashion. Thereafter using modified Seldinger technique, transfemoral access into the right common femoral artery was obtained without difficulty. Over an 0.035 inch guidewire an 8 French 25 cm Pinnacle sheath was inserted. Through this, and also over an 0.035 inch guidewire a combination of a 125 cm Berenstein support catheter inside of an 088 90 cm Neuron Max sheath was advanced to the left subclavian artery. The support catheter, and the wire were removed. An arteriogram was then performed through the Neuron Max sheath in the proximal left subclavian artery. FINDINGS: The left vertebral artery origin is widely patent with moderate tortuosity just distal to this. More distally, there is approximately 50% stenosis of the mid cervical left vertebral artery. Distal to this the left vertebrobasilar junction, the left posterior-inferior cerebellar artery and the tortuous basilar artery demonstrate wide patency. The left posterior cerebral artery, the superior cerebellar arteries and the anterior-inferior cerebellar arteries opacify into the capillary and venous phases. The right posterior cerebral artery demonstrates occlusion of the distal P1 segment. Retrograde opacification of the right vertebrobasilar junction to the level of the right posterior-inferior cerebellar artery is noted. PROCEDURE: Through the Neuron Max sheath in the distal left vertebral artery,  a combination of an 062 132 cm aspiration catheter with an inner 160 cm 043 distal aspiration catheter was advanced over an 018 inch micro guidewire with moderate J configuration to the distal basilar artery. The micro guidewire was then advanced through the occluded right posterior cerebral artery distal P2 segment followed by the 043 aspiration catheter which was engaged  in the occluded left posterior cerebral artery P2 segment. The 062 aspiration catheter was advanced to the proximal P2 segment. Aspiration was then applied at the hub of the 062 aspiration catheter, and the three distal aspiration catheters with a 20 mL syringe for approximately a minute and a half. The two aspiration catheters were then retrieved and removed. A control arteriogram performed through the sheath demonstrated complete revascularization of the right posterior cerebral artery achieving a TICI 3 revascularization. The Neuron Max sheath was then retrieved to its origin proximally. A control arteriogram performed through this demonstrated patency of the left vertebral artery extra cranially and intracranially with patency of the posterior cerebral arteries, the superior cerebellar arteries and the anterior-inferior cerebellar arteries. The Neuron Max sheath was removed. An 8 French Angio-Seal closure device was then deployed at the right groin puncture site. Distal pulses remained Dopplerable in both feet unchanged. A flat panel CT of the brain demonstrated no evidence of hemorrhagic complications. He was extubated. Upon recovery, the patient responded to name. Otherwise, remains unresponsive with no movement of the left arm and leg. Patient was transferred to the neuro ICU for post revascularization care. IMPRESSION: Status post revascularization of occluded distal right posterior cerebral artery P2 segment with 1 pass with 043 contact aspiration achieving a TICI 3 revascularization. PLAN: As per referring MD. Electronically Signed   By: Thyra Nash M.D.   On: 02/17/2023 08:03   CT HEAD WO CONTRAST ( ) Result Date: 02/16/2023 CLINICAL DATA:  Stroke follow-up EXAM: CT HEAD WITHOUT CONTRAST TECHNIQUE: Contiguous axial images were obtained from the base of the skull through the vertex without intravenous contrast. RADIATION DOSE REDUCTION: This exam was performed according to the departmental  dose-optimization program which includes automated exposure control, adjustment of the mA and/or kV according to patient size and/or use of iterative reconstruction technique. COMPARISON:  None Available. FINDINGS: Brain: Involving cytotoxic edema in the right thalamus and right occipital lobe at the site of known infarcts. 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 No acute hemorrhage. No midline shift or significant mass effect. Vascular: No hyperdense vessel or unexpected calcification. Skull: Normal. Negative for fracture or focal lesion. Sinuses/Orbits: No acute finding. Other: None. IMPRESSION: 1. Involving cytotoxic edema in the right thalamus and right occipital lobe at the site of known infarcts. No acute hemorrhage. 2. Unchanged large left anterior para falcine calcified meningioma. Electronically Signed   By: Franky Stanford M.D.   On: 02/16/2023 01:23    MR BRAIN WO CONTRAST Result Date: 02/15/2023 CLINICAL DATA:  Stroke follow-up. Status post revascularization of right P2 occlusion. EXAM: MRI HEAD WITHOUT CONTRAST TECHNIQUE: Multiplanar, multiecho pulse sequences of the brain and surrounding structures were obtained without intravenous contrast. COMPARISON:  Head CT and MRI 02/14/2023 FINDINGS: The patient was unable to tolerate the complete examination which was terminated prematurely. Axial and coronal diffusion, axial T2, axial FLAIR, and sagittal T1 sequences were obtained and are mildly to moderately motion degraded. Brain: Restricted diffusion is again seen within the mesial right temporal lobe and right thalamus, mildly progressed from the prior MRI and with new hypointense signal centrally within the right  thalamic infarct being suspicious for a small amount of hemorrhage (with assessment limited on this incomplete examination which did not include susceptibility weighted or gradient echo imaging). Patchy restricted diffusion in the right periatrial white matter and in  the region of the posterior body and tail of the caudate nucleus is largely new, and there is also a new 2 cm focus of restricted diffusion in the right occipital lobe. 2 new punctate acute infarcts are present in the left occipital lobe. Patchy T2 hyperintensities elsewhere in the cerebral white matter bilaterally are unchanged and nonspecific but compatible with mild chronic small vessel ischemic disease. 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 There is no midline shift or extra-axial fluid collection. There is mild generalized cerebral atrophy. Small chronic right cerebellar infarcts are again noted. Vascular: Major intracranial vascular flow voids are preserved. Skull and upper cervical spine: No suspicious marrow lesion. Advanced left facet arthrosis at C3-4 and C4-5. Sinuses/Orbits: Right cataract extraction. No evidence of significant inflammation in the paranasal sinuses. Clear mastoid air cells. Other: None. IMPRESSION: 1. Incomplete, motion degraded examination. 2. Progressive acute right PCA territory infarct with extension most notable in the right periatrial white matter and occipital lobe. Suspected subcentimeter hemorrhage centrally in the right thalamic infarct. 3. New punctate acute infarcts in the left occipital lobe. 4. Mild chronic small vessel ischemic disease. 5. Known left anterior parafalcine meningioma. Electronically Signed   By: Dasie Hamburg M.D.   On: 02/15/2023 10:35   CT HEAD WO CONTRAST ( ) Result Date: 02/14/2023 CLINICAL DATA:  Stroke follow-up EXAM: CT HEAD WITHOUT CONTRAST TECHNIQUE: Contiguous axial images were obtained from the base of the skull through the vertex without intravenous contrast. RADIATION DOSE REDUCTION: This exam was performed according to the departmental dose-optimization program which includes automated exposure control, adjustment of the mA and/or kV according to patient size and/or use of iterative reconstruction technique. COMPARISON:   Brain MRI from earlier today FINDINGS: Brain: There is likely gray-white differentiation loss asymmetrically in the medial right temporal lobe, site of restricted diffusion previously. The right thalamic MR signal abnormality has also become apparent. No hemorrhage, hydrocephalus, or shift. Chronic small vessel ischemia in the cerebral white matter and known partially calcified left anterior parafalcine meningioma. Vascular: Diffusely high-density vessels in the setting of recent contrast administration. Reference prior CTA. Skull: Normal. Negative for fracture or focal lesion. Sinuses/Orbits: No acute finding. IMPRESSION: Subtle but likely cytotoxic edema now seen at areas of right temporal and thalamic diffusion abnormality. No intracranial hemorrhage. Electronically Signed   By: Dorn Roulette M.D.   On: 02/14/2023 09:15   CT CEREBRAL PERFUSION W CONTRAST Result Date: 02/14/2023 CLINICAL DATA:  Stroke workup EXAM: CT PERFUSION BRAIN TECHNIQUE: Multiphase CT imaging of the brain was performed following IV bolus contrast injection. Subsequent parametric perfusion maps were calculated using RAPID software. RADIATION DOSE REDUCTION: This exam was performed according to the departmental dose-optimization program which includes automated exposure control, adjustment of the mA and/or kV according to patient size and/or use of iterative reconstruction technique. CONTRAST:  40mL OMNIPAQUE  IOHEXOL  350 MG/ML SOLN COMPARISON:  CT perfusion from earlier the same day FINDINGS: CT Brain Perfusion Findings: CBF (<30%) Volume: 0mL Perfusion (Tmax>6.0s) volume: 36mL in the right PCA distribution primarily seen at the occipital lobe. IMPRESSION: Increased ischemic range perfusion in the right PCA distribution measuring 36 cc as compared to 25 cc earlier today. Electronically Signed   By: Dorn Roulette M.D.   On: 02/14/2023 09:08   MR BRAIN  WO CONTRAST Result Date: 02/14/2023 CLINICAL DATA:  Wake up stroke EXAM: MRI HEAD  WITHOUT CONTRAST TECHNIQUE: Multiplanar, multiecho pulse sequences of the brain and surrounding structures were obtained without intravenous contrast. COMPARISON:  Head CTA from earlier today FINDINGS: Brain: Restricted diffusion in the right thalamus, right periatrial white matter, and medial temporal lobe at the hippocampus without any FLAIR changes. Case discussed while in progress with neurology. No acute infarct seen in a different vascular distribution. Known anterior left parafalcine meningioma with signal of dense cellularity, reference measurements by CT given this noncontrast study. There is mass effect on the adjacent frontal lobe without edema. Mild chronic small vessel ischemia in the cerebral white matter. Small chronic right cerebellar infarct. No acute hemorrhage, hydrocephalus, or collection. Vascular: No detected change from prior. Skull and upper cervical spine: Normal marrow signal. Sinuses/Orbits: Unremarkable IMPRESSION: Diffusion hyperintensity in the right PCA distribution affecting the medial temporal lobe and thalamus. FLAIR hyperintensity is not yet detected. Chronic small vessel ischemia and left anterior parafalcine meningioma. Electronically Signed   By: Dorn Roulette M.D.   On: 02/14/2023 05:43   CT ANGIO HEAD NECK W WO CM W PERF (CODE STROKE) Result Date: 02/14/2023 CLINICAL DATA:  Neuro deficit with acute stroke suspected. EXAM: CT ANGIOGRAPHY HEAD AND NECK CT PERFUSION BRAIN TECHNIQUE: Multidetector CT imaging of the head and neck was performed using the standard protocol during bolus administration of intravenous contrast. Multiplanar CT image reconstructions and MIPs were obtained to evaluate the vascular anatomy. Carotid stenosis measurements (when applicable) are obtained utilizing NASCET criteria, using the distal internal carotid diameter as the denominator. Multiphase CT imaging of the brain was performed following IV bolus contrast injection. Subsequent parametric  perfusion maps were calculated using RAPID software. RADIATION DOSE REDUCTION: This exam was performed according to the departmental dose-optimization program which includes automated exposure control, adjustment of the mA and/or kV according to patient size and/or use of iterative reconstruction technique. CONTRAST:  Dose is not known on this in progress study. COMPARISON:  Head CT from earlier today FINDINGS: Aortic arch: Atheromatous plaque with 3 vessel branching. Right carotid system: Calcified plaque in the proximal ICA without significant stenosis, ulceration, or beading. Left carotid system: Mild atheromatous plaque for age mainly at the proximal ICA without stenosis or ulceration. Vertebral arteries: Proximal subclavian arteries. Calcified plaque at both vertebral ostia. No flow reducing stenosis, ulceration, or dissection. Skeleton: Generalized cervical spine degeneration with mild scoliosis. Other neck: No acute finding Upper chest: Emphysema and airway thickening. No acute finding. Review of the MIP images confirms the above findings CTA HEAD FINDINGS Limited by venous contamination. Anterior circulation: No emergent branch occlusion, segmental narrowing, aneurysm, or vascular malformation. There is diffuse atheromatous irregularity affecting the MCA and ACA distributions without correctable and proximal flow reducing stenosis. Posterior circulation: The vertebral and basilar arteries are atheromatous and tortuous without occlusion or flow reducing stenosis. Abrupt cut off at the right P2 segment with some downstream reconstitution. Venous sinuses: Unremarkable Anatomic variants: None significant CT Brain Perfusion Findings: ASPECTS: 10 CBF (<30%) Volume: 0mL Perfusion (Tmax>6.0s) volume: 14mL-in the right PCA distribution Findings discussed with neurology before signing. IMPRESSION: 1. Right P2 occlusion, presumably acute in this setting. There is 25 cc of relative ischemia by CT perfusion in this  distribution. 2. Extensive intracranial atherosclerosis. 3. Atherosclerosis in the neck without flow reducing stenosis or suspicious embolic source. Electronically Signed   By: Dorn Roulette M.D.   On: 02/14/2023 04:43   CT HEAD CODE STROKE WO  CONTRAST Result Date: 02/14/2023 CLINICAL DATA:  Code stroke. EXAM: CT HEAD WITHOUT CONTRAST TECHNIQUE: Contiguous axial images were obtained from the base of the skull through the vertex without intravenous contrast. RADIATION DOSE REDUCTION: This exam was performed according to the departmental dose-optimization program which includes automated exposure control, adjustment of the mA and/or kV according to patient size and/or use of iterative reconstruction technique. COMPARISON:  03/29/2013 FINDINGS: Brain: No evidence of acute infarction, hemorrhage, hydrocephalus, extra-axial collection. Partially calcified anterior left parafalcine mass consistent with meningioma, 3 cm in maximal size with mass effect on the local frontal lobe. No evidence of adjacent brain edema. Vascular: No hyperdense vessel or unexpected calcification. Skull: Normal. Negative for fracture or focal lesion. Sinuses/Orbits: No acute finding. Other: Prelim sent in epic chat. ASPECTS [facility] Stroke Program Early CT Score) Ten on both sides. IMPRESSION: 1. No acute finding. 2. 3 cm anterior left parafalcine meningioma that has enlarged from 2015. Electronically Signed   By: Dorn Roulette M.D.   On: 02/14/2023 04:19    12-lead ECG SR All prior EKG's in EPIC reviewed with no documented atrial fibrillation  Telemetry SR, infrequently PVCs, rarely NSVT 3-8 beats noted early in his stay, rare PATs, no AFib  Assessment and Plan:  1. Cryptogenic stroke The patient presents with cryptogenic stroke. Dr. Fernande spoke at length with the patient about monitoring for afib with either a 30 day event monitor or an implantable loop recorder.  Risks, benefits, and alteratives to implantable loop recorder  were discussed with the patient today.   At this time, the patient is very clear in their decision to proceed with implantable loop recorder.  Atrial tachycardia   HTN/DM  Wound care was reviewed with the patient (keep incision clean and dry for 3 days).  Wound check follow up will be scheduled for the patient.  Please call with questions.   Charlies Macario Arthur, PA-C 02/20/2023   Rreviewed strategy and rationale for ILR, agreeable   Some minimal movement of L hand nd foot

## 2023-02-20 NOTE — Progress Notes (Addendum)
 STROKE TEAM PROGRESS NOTE   INTERIM HISTORY/SUBJECTIVE Patient remains afebrile with stable and neuro exam vital signs.  He has received insurance approval for CIR and will hopefully obtain a bed on Saturday.  OBJECTIVE  CBC    Component Value Date/Time   WBC 5.8 02/19/2023 0537   RBC 4.65 02/19/2023 0537   HGB 13.0 02/19/2023 0537   HCT 41.1 02/19/2023 0537   PLT 181 02/19/2023 0537   MCV 88.4 02/19/2023 0537   MCH 28.0 02/19/2023 0537   MCHC 31.6 02/19/2023 0537   RDW 14.6 02/19/2023 0537   LYMPHSABS 1.2 02/14/2023 0406   MONOABS 0.7 02/14/2023 0406   EOSABS 0.2 02/14/2023 0406   BASOSABS 0.0 02/14/2023 0406    BMET    Component Value Date/Time   NA 136 02/19/2023 0537   K 4.2 02/19/2023 0537   CL 103 02/19/2023 0537   CO2 23 02/19/2023 0537   GLUCOSE 106 (H) 02/19/2023 0537   BUN 16 02/19/2023 0537   CREATININE 1.23 02/19/2023 0537   CALCIUM  9.7 02/19/2023 0537   GFRNONAA >60 02/19/2023 0537    IMAGING past 24 hours No results found.   Vitals:   02/19/23 1516 02/19/23 2003 02/19/23 2355 02/20/23 0343  BP: (!) 143/91 123/64 (!) 140/73 (!) 136/103  Pulse: 73 75 84 70  Resp: 18 18 18 18   Temp: 98.9 F (37.2 C) 98.2 F (36.8 C) (!) 97.4 F (36.3 C) 97.8 F (36.6 C)  TempSrc: Oral Oral Oral Oral  SpO2: 98% 93% 94% 91%  Weight:      Height:         PHYSICAL EXAM General:  Alert, well-nourished, well-developed patient in no acute distress Psych:  Mood and affect appropriate for situation CV: Regular rate and rhythm on monitor Respiratory:  Regular, unlabored respirations on room air GI: Abdomen soft and nontender   NEURO:  Mental Status: AA&Ox3  Speech/Language: speech is without dysarthria or aphasia.  Cranial Nerves:  II: PERRL. III, IV, VI: EOMI. Eyelids elevate symmetrically.  V: Sensation is intact to light touch and symmetrical to face.  VII: Smile is symmetrical.  VIII: hearing intact to voice. IX, X: Phonation is normal.  XII: tongue  is midline without fasciculations. Motor: 5 out of 5 strength to right upper and lower extremities, able to move fingers of left hand only, unable to lift off the bed, 3/5 strength in left lower extremity Tone: is normal and bulk is normal Sensation- Intact to light touch bilaterally.  Coordination: FTN intact on the right Gait- deferred    ASSESSMENT/PLAN [name] is a 73 y.o. male withPMH significant for HTN, prior cerebellar stroke, hypothyroidism who was BIB EMS as a CODE STROKE 2/1 early AM due to left-sided weakness, numbness, dysconjugate gaze, slight R gaze preference and inconsistent left visual field testing. Per EMS, wife stated his LKW was around 0115.  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 Thrombectomy was discussed by neurologist Dr. Vanessa, IR Dr. Dolphus and family who consented. However, patient improved from an NIH of 11 to an NIH of 3 en route to IR suite and it was felt that deficits were too small to intervene, but appropriate for TNKase .  STAT MRI showed diffusion restriction in R medial temporal with no signal change on FLAIR. At that time, NIH was fluctuating from 3-5 due to L arm and leg weakness fluctuating in intensity.   Stroke:  right PCA scattered infarct with thalamic  HT and punctate left PCA infarcts with R P2 occlusion s/p TNK and IR are with TICI3 repeat fusion, etiology: Concerning for cardioembolic source Code Stroke CT head - No acute abnormality.  3 cm anterior left parafalcine meningioma, enlarged from 2015. CTA head & neck w/Perfusion Right P2 occlusion. Extensive intracranial atherosclerosis at bilateral ICA bulbs and siphons. CTP 0/25cc  MRI Diffusion hyperintensity in the right PCA distribution affecting the medial temporal lobe and thalamus. FLAIR hyperintensity is not yet detected. left anterior parafalcine meningioma. NIHSS 11->3->7 Repeat CTH Subtle but likely cytotoxic edema now seen at areas  of right temporal and thalamic diffusion abnormality Repeat CT Perfusion 0/36 S/p IR with right distal P2 occlusion and TICI3 reperfusion MRI Progressive acute right PCA territory infarct with extension most notable in the right periatrial white matter and occipital lobe. Suspected subcentimeter hemorrhage centrally in the right thalamic infarct. New punctate acute infarcts in the left occipital lobe. 2/3 CT repeat  Involving cytotoxic edema in the right thalamus and right occipital lobe at the site of known infarcts. No acute hemorrhage. Unchanged large left anterior para falcine calcified meningioma. 2D Echo EF 55%, no LV thrombus LE venous Doppler no DVT Loop recorder placed today  LDL 30 HgbA1c 6.4 UDS negative VTE prophylaxis -Lovenox  aspirin  325 mg daily prior to admission, now on ASA 81 and plavix  DAPT for 3 weeks and then plavix  alone. Therapy recommendations: CIR Disposition: CIR, plan for Saturday  History of stroke 03/2013 right PICA small infarct, EF 35 to 40%, LDL 112.  Discharged on aspirin  81 and statin.  History of CHF 03/2013 2D echo EF 35 to 40% Home medication including metoprolol , spironolactone  2D echo this admission EF 55% Resumed home metoprolol  and spironolactone  Loop recorder placed  Severe cervical stenosis Pt wife showed me pt MRI C-spine exam report at [facility] It showed severe cervical stenosis with early sign of myelopathy I told wife that pt is not candidate for cervical surgery at this time After CIR, pt can follow up with neurosurgery in [place] or if she and pt want, we can refer him to our neurosurgery.   Hypertension Home meds: Spironolactone  25, toprol -xl 100mg  Cleviprex  gtt off Resumed home metoprolol  and spironolactone  Add amlodipine  10 Labetalol  IV PRN BP goal less than 160 given small HT Monitor BP goal normotensive  Hyperlipidemia Home meds: Lipitor  80 LDL 30, goal < 70 Resume home Lipitor  80 Consider statin on discharge  AKI creatinine  1.20--1.30--0.96 --1.44--1.32-> 1.21-> 1.23 improved Encourage p.o. intake Off IVF @ 50 BMP monitoring  Sundowning Delirium  Likely mild baseline cognitive impairment Resume home Effexor  Continue seroquel  50mg  HS improved  Other Stroke Risk Factors Obesity, Body mass index is 32.95 kg/m., BMI >/= 30 associated with increased stroke risk, recommend weight loss, diet and exercise as appropriate  Former smoker  Other acute issues Gout, resume allopurinol  Stable left frontal meningioma  Hospital day # 6  Patient seen and examined by NP/APP with MD. MD to update note as needed.   Jorene Last, DNP, FNP-BC [facility] Pager: [phone]  ATTENDING NOTE: I reviewed above note and agree with the assessment and plan. Pt was seen and examined.   Wife and daughter in law are at the bedside. Pt lying in bed, AAO x 3, but still has dense left hemiplegia with 2-/5 LUE and LLE. Had loop recorder placed today and pending CIR placement tomorrow. On DAPT and statin, Cre normalized. Wife showed me pt MRI C spine report at [place], showed severe spinal  stenosis with early sign of myelopathy. Pt not candidate for cervical surgery at this time, [facility] or refer to our neurosurgery team.   For detailed assessment and plan, please refer to above/below as I have made changes wherever appropriate.   Ary Cummins, MD PhD Stroke Neurology 02/20/2023 8:56 PM    To contact Stroke Continuity provider, please refer to [email or web address]. After hours, contact General Neurology

## 2023-02-20 NOTE — H&P (Signed)
 Physical Medicine and Rehabilitation Admission H&P    Chief Complaint  Patient presents with   Code Stroke    LKW : 10 pm 02/13/23  : HPI: Edward Rodgers is a 73 year old right-handed male with history of diastolic congestive heart failure, gout, hypothyroidism, prior cerebellar infarct with residual diplopia maintained on full-strength aspirin , prostate cancer, tobacco use.  Per chart review patient lives with spouse.  Two-level home bed and bath on main level one-step to entry.  Modified independent prior to admission.  Presented to 02/02/2023 with left-sided weakness numbness and disconjugate gaze.  CT/MRI showed diffusion hyperintensity in the right PCA distribution affecting the medial temporal lobe and thalamus.  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  CTA right P2 occlusion presumably acute in the setting.  Extensive intracranial atherosclerosis.  CT perfusion of the brain showed increased ischemic range perfusion on the right PCA distribution measuring 36 cc as compared to 25 cc on prior imaging.  Admission chemistries unremarkable except glucose 115 BUN 24, hemoglobin A1c 6.4.  Echocardiogram with ejection fraction of 55% no wall motion abnormalities grade 1 diastolic dysfunction.  Patient did receive TNK.  MRI follow-up revealed progressive acute right PCA territory infarct with extension most notable in the right periatrial white matter and occipital lobe.  There was suspected subcentimeter hemorrhage centrally in the right thalamic infarct.  New punctate infarct in the left occipital lobe also seen.  Neurology follow-up placed on low-dose aspirin  and Plavix  for CVA prophylaxis x 3 weeks then Plavix  alone.  Subcutaneous heparin  for DVT prophylaxis.  Await plan for loop recorder.  Mild AKI 1.20-1.44 placed on gentle IV fluids since discontinued with latest creatinine 1.23.  Therapy evaluations completed due to patient decreased functional mobility and left-sided  weakness was admitted for a comprehensive rehab program.  Review of Systems  Constitutional:  Negative for chills and fever.  HENT:  Negative for hearing loss.   Eyes:  Positive for blurred vision and double vision.  Respiratory:  Negative for cough, shortness of breath and wheezing.   Cardiovascular:  Negative for palpitations and leg swelling.  Gastrointestinal:  Positive for constipation. Negative for heartburn, nausea and vomiting.  Genitourinary:  Positive for urgency. Negative for dysuria, flank pain and hematuria.  Musculoskeletal:  Positive for myalgias.  Skin:  Negative for rash.  Neurological:  Positive for dizziness, sensory change and weakness.  Psychiatric/Behavioral:  Positive for depression.   All other systems reviewed and are negative.  Past Medical History:  Diagnosis Date   Cancer (HCC) 2002   prostate   CHF (congestive heart failure) (HCC)    Gout    Hypertension    Thyroid  disease    Past Surgical History:  Procedure Laterality Date   COLONOSCOPY N/A 07/15/2017   Procedure: COLONOSCOPY;  Surgeon: Golda Claudis PENNER, MD;  Location: [facility];  Service: Endoscopy;  Laterality: N/A;  830   IR CT HEAD LTD  02/14/2023   IR PERCUTANEOUS ART THROMBECTOMY/INFUSION INTRACRANIAL INC DIAG ANGIO  02/14/2023   POLYPECTOMY  07/15/2017   Procedure: POLYPECTOMY;  Surgeon: Golda Claudis PENNER, MD;  Location: [facility];  Service: Endoscopy;;  colon   PROSTATE SURGERY     RADIOLOGY WITH ANESTHESIA Left 02/14/2023   Procedure: IR WITH ANESTHESIA;  Surgeon: Radiologist, Medication, MD;  Location: [facility];  Service: Radiology;  Laterality: Left;   RADIOLOGY WITH ANESTHESIA N/A 02/14/2023   Procedure: IR WITH ANESTHESIA;  Surgeon: Radiologist, Medication, MD;  Location: [facility] OR;  Service: Radiology;  Laterality: N/A;   No family history on file. Social History:  reports that he has quit smoking. He has never used smokeless tobacco. He reports that he does not drink alcohol and does not use  drugs. Allergies: No Known Allergies Medications Prior to Admission  Medication Sig Dispense Refill   alendronate (FOSAMAX) 70 MG tablet Take 70 mg by mouth once a week. Take with a full glass of water  on an empty stomach. mondays     allopurinol  (ZYLOPRIM ) 300 MG tablet Take 300 mg by mouth every morning.     aspirin  325 MG tablet Take 1 tablet (325 mg total) by mouth daily. 30 tablet 5   atorvastatin  (LIPITOR ) 80 MG tablet Take 80 mg by mouth daily.     Calcium  Carb-Cholecalciferol  (CALCIUM  600 + D PO) Take 1 tablet by mouth in the morning and at bedtime.     empagliflozin (JARDIANCE) 25 MG TABS tablet Take 12.5 mg by mouth daily.     latanoprost  (XALATAN ) 0.005 % ophthalmic solution Place 1 drop into the right eye at bedtime.     levothyroxine  (SYNTHROID ) 88 MCG tablet Take 88 mcg by mouth daily before breakfast.     meloxicam  (MOBIC ) 7.5 MG tablet Take 7.5 mg by mouth daily.     metoprolol  succinate (TOPROL -XL) 100 MG 24 hr tablet Take 100 mg by mouth daily. Take with or immediately following a meal.     mirabegron  ER (MYRBETRIQ ) 50 MG TB24 tablet Take 50 mg by mouth daily.     spironolactone  (ALDACTONE ) 25 MG tablet Take 25 mg by mouth daily.     venlafaxine  XR (EFFEXOR -XR) 150 MG 24 hr capsule Take 150 mg by mouth every evening.        Home: Home Living Family/patient expects to be discharged to:: Private residence Living Arrangements: Spouse/significant other Available Help at Discharge: Family, Available PRN/intermittently Type of Home: House Home Access: Stairs to enter Secretary/administrator of Steps: 1 Entrance Stairs-Rails: None Home Layout: Two level, Able to live on main level with bedroom/bathroom, Laundry or work area in basement Alternate Teacher, Music of Steps: wood stove in basement Alternate Level Stairs-Rails: Right Bathroom Shower/Tub: Engineer, Manufacturing Systems: Standard Bathroom Accessibility: Yes Home Equipment: Information systems manager, Medical Laboratory Scientific Officer - quad, Hand  held shower head  Lives With: Spouse   Functional History: Prior Function Prior Level of Function : Independent/Modified Independent Mobility Comments: usses cane when he thinks about it ADLs Comments: Pt reports mod I for ADL and medication management.  Functional Status:  Mobility: Bed Mobility Overal bed mobility: Needs Assistance Bed Mobility: Rolling, Sidelying to Sit Rolling: Max assist (to pts left) Sidelying to sit: Max assist Supine to sit: Mod assist Sit to supine: Max assist General bed mobility comments: assist for trunk elevation/lowering, LLE progression to/from EOB, bringing LUE into lap when moving to EOB, scooting to EOB with assist of bed pad. Transfers Overall transfer level: Needs assistance Equipment used: 2 person hand held assist Transfers: Sit to/from Stand, Bed to chair/wheelchair/BSC Sit to Stand: Mod assist, +2 physical assistance Bed to/from chair/wheelchair/BSC transfer type:: Squat pivot Stand pivot transfers: Max assist, +2 physical assistance Squat pivot transfers: Max assist Transfer via Lift Equipment: Stedy General transfer comment: +2 max A using Camie Ip from bed to chair Ambulation/Gait General Gait Details: unable    ADL: ADL Overall ADL's : Needs assistance/impaired Eating/Feeding: Moderate assistance, Sitting Grooming: Sitting, Maximal assistance Upper Body Bathing: Maximal assistance, Sitting Lower Body Bathing: Total assistance, +2 for  physical assistance, +2 for safety/equipment, Sit to/from stand, Sitting/lateral leans Upper Body Dressing : Maximal assistance, Sitting Lower Body Dressing: Total assistance, +2 for safety/equipment, +2 for physical assistance Toilet Transfer: Maximal assistance, +2 for physical assistance, +2 for safety/equipment, Stand-pivot Functional mobility during ADLs: +2 for physical assistance, Maximal assistance (Used steady for transfer from bed to chair) General ADL Comments: limited by attention,  vision, L hemi  Cognition: Cognition Overall Cognitive Status: Impaired/Different from baseline Arousal/Alertness: Lethargic Orientation Level: Oriented to person, Oriented to place, Oriented to situation Year: Other (Comment) (2005) Day of Week: Correct Attention: Sustained, Focused Focused Attention: Appears intact Sustained Attention: Impaired Sustained Attention Impairment: Verbal complex Memory: Impaired Memory Impairment: Decreased short term memory Decreased Short Term Memory: Verbal basic Awareness: Impaired Awareness Impairment: Emergent impairment Problem Solving: Impaired Problem Solving Impairment: Verbal complex Executive Function: Landscape Architect: Impaired Organizing Impairment: Functional complex Safety/Judgment: Appears intact Cognition Arousal: Alert Behavior During Therapy: WFL for tasks assessed/performed Overall Cognitive Status: Impaired/Different from baseline Area of Impairment: Attention, Safety/judgement, Orientation Orientation Level: Person, Situation, Place Current Attention Level: Sustained Safety/Judgement: Decreased awareness of deficits, Decreased awareness of safety General Comments: Patient able to follow one step commands  Physical Exam: Blood pressure (!) 136/103, pulse 70, temperature 97.8 F (36.6 C), temperature source Oral, resp. rate 18, height 5' 9 (1.753 m), weight 101.2 kg, SpO2 91%. Physical Exam Constitutional:      General: He is not in acute distress.    Appearance: He is not ill-appearing.  HENT:     Head: Normocephalic and atraumatic.     Right Ear: External ear normal.     Left Ear: External ear normal.     Nose: Nose normal.     Mouth/Throat:     Mouth: Mucous membranes are moist.  Eyes:     Extraocular Movements: Extraocular movements intact.     Conjunctiva/sclera: Conjunctivae normal.     Pupils: Pupils are equal, round, and reactive to light.  Cardiovascular:     Rate and Rhythm: Normal rate and  regular rhythm.     Heart sounds: Murmur heard.  Pulmonary:     Effort: Pulmonary effort is normal. No respiratory distress.     Breath sounds: No stridor. No wheezing or rhonchi.  Abdominal:     General: Bowel sounds are normal. There is no distension.     Palpations: Abdomen is soft.     Tenderness: There is no abdominal tenderness.  Musculoskeletal:        General: Swelling (trace LUE) present.     Cervical back: Normal range of motion.     Left lower leg: Edema present.  Skin:    General: Skin is warm and dry.     Findings: No bruising or erythema.  Neurological:     Mental Status: He is alert.     Comments: Pt is alert. Oriented to person, place, month/year. Knew why he was here. Told me who he was rooting for in [employer] tomorrow. Was a little drowsy, nodded off a few times during my visit. Speech dysarthric. Language appears intact. Fair insight and awareness. Left central 7 and tongue deviation. LUE 1/5 prox to distal. LLLE 1 to 1+/5 prox to distal. RUE 4/5, RLE 4- to 4/5. Decreased LT and pain in left leg and arm as wel. DTR's 2+ on left. Toes up. Heel cord a little tight, wearing PRAFO. No resting tone appreciated.    Psychiatric:     Comments: Pt pleasant and cooperative  Results for orders placed or performed during the hospital encounter of 02/14/23 (from the past 48 hours)  Basic metabolic panel     Status: Abnormal   Collection Time: 02/19/23  5:37 AM  Result Value Ref Range   Sodium 136 135 - 145 mmol/L   Potassium 4.2 3.5 - 5.1 mmol/L   Chloride 103 98 - 111 mmol/L   CO2 23 22 - 32 mmol/L   Glucose, Bld 106 (H) 70 - 99 mg/dL    Comment: Glucose reference range applies only to samples taken after fasting for at least 8 hours.   BUN 16 8 - 23 mg/dL   Creatinine, Ser 8.76 0.61 - 1.24 mg/dL   Calcium  9.7 8.9 - 10.3 mg/dL   GFR, Estimated >39 >39 mL/min    Comment: (NOTE) Calculated using the CKD-EPI Creatinine Equation (2021)    Anion gap 10 5 - 15     Comment: Performed at [facility], [address], [place], [place] [address]  CBC     Status: None   Collection Time: 02/19/23  5:37 AM  Result Value Ref Range   WBC 5.8 4.0 - 10.5 K/uL   RBC 4.65 4.22 - 5.81 MIL/uL   Hemoglobin 13.0 13.0 - 17.0 g/dL   HCT 58.8 60.9 - 47.9 %   MCV 88.4 80.0 - 100.0 fL   MCH 28.0 26.0 - 34.0 pg   MCHC 31.6 30.0 - 36.0 g/dL   RDW 85.3 88.4 - 84.4 %   Platelets 181 150 - 400 K/uL   nRBC 0.0 0.0 - 0.2 %    Comment: Performed at [facility], [address] [place], [place] [address]   No results found.  Rbetriq 50 mg daslu  Blood pressure (!) 136/103, pulse 70, temperature 97.8 F (36.6 C), temperature source Oral, resp. rate 18, height 5' 9 (1.753 m), weight 101.2 kg, SpO2 91%.  Medical Problem List and Plan: 1. 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  Await plan for loop recorder  -patient may shower  -ELOS/Goals: 21-25 days, min assist with PT, OT and supervision with SLP 2.  Antithrombotics: -DVT/anticoagulation:  Pharmaceutical: Heparin   -antiplatelet therapy: Aspirin  81 mg daily and Plavix  75 mg day x 3 weeks then Plavix  alone 3. Pain Management: Lidoderm  patch as directed, Tylenol  as needed 4. Mood/Sleep/sundowning: Effexor  150 mg every evening provide emotional support  -antipsychotic agents: Seroquel  50 mg nighty at 2000, have back up dose  -keep sleep chart 5. Neuropsych/cognition: This patient is capable of making decisions on his own behalf. 6. Skin/Wound Care: Routine skin checks 7. Fluids/Electrolytes/Nutrition: Routine in and outs with follow-up chemistries 8.  Hypothyroidism.  Synthroid  9.  Hypertension.Norvasc  10 mg daily,Toprol -Xl 100 mg daily,Aldactone  25 mg daily.  -bp trending up. Likely will need additions to regimen 10.H/O prostate cancer..Myrbetriq  50 mg daily. 11.Hyperlipidemia.Lipitor  12.Constipation.  -+bm today,    -colace daily 13.H/O gout.Zyloprim   Toribio JINNY Pitch, PA-C 02/20/2023

## 2023-02-20 NOTE — Progress Notes (Signed)
 Inpatient Rehabilitation Admissions Coordinator   I have CIR bed to admit him to on Saturday. Dr Babs will see patient in the am to give final approval. [address] Nurse can call report to [phone] at 12 noon tomorrow to clarify when rehab bed available tomorrow . I met with patient and his brother at bedside and they are in agreement. Dr Jerri and cardiology aware to arrange LOOP placement today. I will make the arrangements. Acute team and TOC made aware.  Heron Leavell, RN, MSN Rehab Admissions Coordinator [phone] 02/20/2023 10:51 AM

## 2023-02-21 ENCOUNTER — Inpatient Hospital Stay (HOSPITAL_COMMUNITY)
Admission: AD | Admit: 2023-02-21 | Discharge: 2023-03-28 | DRG: 057 | Disposition: A | Discharge status: Home Health | Payer: Medicare Other | Source: Intra-hospital | Attending: Physical Medicine & Rehabilitation | Admitting: Physical Medicine & Rehabilitation

## 2023-02-21 ENCOUNTER — Encounter (HOSPITAL_COMMUNITY): Payer: Self-pay | Admitting: Physical Medicine & Rehabilitation

## 2023-02-21 ENCOUNTER — Other Ambulatory Visit: Payer: Self-pay

## 2023-02-21 DIAGNOSIS — E785 Hyperlipidemia, unspecified: Secondary | ICD-10-CM | POA: Diagnosis present

## 2023-02-21 DIAGNOSIS — I5022 Chronic systolic (congestive) heart failure: Secondary | ICD-10-CM | POA: Diagnosis not present

## 2023-02-21 DIAGNOSIS — Z79899 Other long term (current) drug therapy: Secondary | ICD-10-CM | POA: Diagnosis not present

## 2023-02-21 DIAGNOSIS — Z87891 Personal history of nicotine dependence: Secondary | ICD-10-CM | POA: Diagnosis not present

## 2023-02-21 DIAGNOSIS — J439 Emphysema, unspecified: Secondary | ICD-10-CM | POA: Diagnosis present

## 2023-02-21 DIAGNOSIS — F54 Psychological and behavioral factors associated with disorders or diseases classified elsewhere: Secondary | ICD-10-CM | POA: Diagnosis not present

## 2023-02-21 DIAGNOSIS — K5900 Constipation, unspecified: Secondary | ICD-10-CM | POA: Diagnosis present

## 2023-02-21 DIAGNOSIS — L89312 Pressure ulcer of right buttock, stage 2: Secondary | ICD-10-CM | POA: Diagnosis present

## 2023-02-21 DIAGNOSIS — R404 Transient alteration of awareness: Secondary | ICD-10-CM | POA: Diagnosis not present

## 2023-02-21 DIAGNOSIS — I5032 Chronic diastolic (congestive) heart failure: Secondary | ICD-10-CM | POA: Diagnosis not present

## 2023-02-21 DIAGNOSIS — Z79890 Hormone replacement therapy (postmenopausal): Secondary | ICD-10-CM

## 2023-02-21 DIAGNOSIS — Z86711 Personal history of pulmonary embolism: Secondary | ICD-10-CM | POA: Diagnosis not present

## 2023-02-21 DIAGNOSIS — Z7982 Long term (current) use of aspirin: Secondary | ICD-10-CM

## 2023-02-21 DIAGNOSIS — Z7983 Long term (current) use of bisphosphonates: Secondary | ICD-10-CM

## 2023-02-21 DIAGNOSIS — M109 Gout, unspecified: Secondary | ICD-10-CM | POA: Diagnosis present

## 2023-02-21 DIAGNOSIS — D72823 Leukemoid reaction: Secondary | ICD-10-CM | POA: Diagnosis not present

## 2023-02-21 DIAGNOSIS — W050XXA Fall from non-moving wheelchair, initial encounter: Secondary | ICD-10-CM | POA: Diagnosis not present

## 2023-02-21 DIAGNOSIS — K5901 Slow transit constipation: Secondary | ICD-10-CM

## 2023-02-21 DIAGNOSIS — Z7902 Long term (current) use of antithrombotics/antiplatelets: Secondary | ICD-10-CM | POA: Diagnosis not present

## 2023-02-21 DIAGNOSIS — Z7984 Long term (current) use of oral hypoglycemic drugs: Secondary | ICD-10-CM

## 2023-02-21 DIAGNOSIS — Z7901 Long term (current) use of anticoagulants: Secondary | ICD-10-CM

## 2023-02-21 DIAGNOSIS — J9811 Atelectasis: Secondary | ICD-10-CM | POA: Diagnosis present

## 2023-02-21 DIAGNOSIS — I5042 Chronic combined systolic (congestive) and diastolic (congestive) heart failure: Secondary | ICD-10-CM | POA: Diagnosis present

## 2023-02-21 DIAGNOSIS — R32 Unspecified urinary incontinence: Secondary | ICD-10-CM | POA: Diagnosis present

## 2023-02-21 DIAGNOSIS — R4189 Other symptoms and signs involving cognitive functions and awareness: Secondary | ICD-10-CM

## 2023-02-21 DIAGNOSIS — I110 Hypertensive heart disease with heart failure: Secondary | ICD-10-CM | POA: Diagnosis present

## 2023-02-21 DIAGNOSIS — Z791 Long term (current) use of non-steroidal anti-inflammatories (NSAID): Secondary | ICD-10-CM

## 2023-02-21 DIAGNOSIS — I82432 Acute embolism and thrombosis of left popliteal vein: Secondary | ICD-10-CM

## 2023-02-21 DIAGNOSIS — R079 Chest pain, unspecified: Secondary | ICD-10-CM | POA: Diagnosis not present

## 2023-02-21 DIAGNOSIS — Z7409 Other reduced mobility: Secondary | ICD-10-CM | POA: Diagnosis present

## 2023-02-21 DIAGNOSIS — F05 Delirium due to known physiological condition: Secondary | ICD-10-CM | POA: Diagnosis present

## 2023-02-21 DIAGNOSIS — E871 Hypo-osmolality and hyponatremia: Secondary | ICD-10-CM

## 2023-02-21 DIAGNOSIS — I10 Essential (primary) hypertension: Secondary | ICD-10-CM

## 2023-02-21 DIAGNOSIS — R072 Precordial pain: Secondary | ICD-10-CM | POA: Diagnosis not present

## 2023-02-21 DIAGNOSIS — R569 Unspecified convulsions: Secondary | ICD-10-CM | POA: Diagnosis not present

## 2023-02-21 DIAGNOSIS — Z8546 Personal history of malignant neoplasm of prostate: Secondary | ICD-10-CM

## 2023-02-21 DIAGNOSIS — I5020 Unspecified systolic (congestive) heart failure: Secondary | ICD-10-CM

## 2023-02-21 DIAGNOSIS — N179 Acute kidney failure, unspecified: Secondary | ICD-10-CM

## 2023-02-21 DIAGNOSIS — I63531 Cerebral infarction due to unspecified occlusion or stenosis of right posterior cerebral artery: Secondary | ICD-10-CM | POA: Diagnosis not present

## 2023-02-21 DIAGNOSIS — Z86718 Personal history of other venous thrombosis and embolism: Secondary | ICD-10-CM

## 2023-02-21 DIAGNOSIS — R0981 Nasal congestion: Secondary | ICD-10-CM | POA: Diagnosis present

## 2023-02-21 DIAGNOSIS — G4700 Insomnia, unspecified: Secondary | ICD-10-CM | POA: Diagnosis present

## 2023-02-21 DIAGNOSIS — M4802 Spinal stenosis, cervical region: Secondary | ICD-10-CM | POA: Diagnosis present

## 2023-02-21 DIAGNOSIS — R0602 Shortness of breath: Secondary | ICD-10-CM | POA: Diagnosis not present

## 2023-02-21 DIAGNOSIS — N4889 Other specified disorders of penis: Secondary | ICD-10-CM | POA: Diagnosis not present

## 2023-02-21 DIAGNOSIS — I69354 Hemiplegia and hemiparesis following cerebral infarction affecting left non-dominant side: Secondary | ICD-10-CM | POA: Diagnosis present

## 2023-02-21 DIAGNOSIS — E039 Hypothyroidism, unspecified: Secondary | ICD-10-CM | POA: Diagnosis present

## 2023-02-21 DIAGNOSIS — Y92239 Unspecified place in hospital as the place of occurrence of the external cause: Secondary | ICD-10-CM | POA: Diagnosis not present

## 2023-02-21 DIAGNOSIS — H539 Unspecified visual disturbance: Secondary | ICD-10-CM | POA: Diagnosis not present

## 2023-02-21 DIAGNOSIS — I2699 Other pulmonary embolism without acute cor pulmonale: Secondary | ICD-10-CM | POA: Diagnosis not present

## 2023-02-21 DIAGNOSIS — Z8679 Personal history of other diseases of the circulatory system: Secondary | ICD-10-CM | POA: Diagnosis not present

## 2023-02-21 LAB — CBC
HCT: 40.6 % (ref 39.0–52.0)
Hemoglobin: 13 g/dL (ref 13.0–17.0)
MCH: 27.9 pg (ref 26.0–34.0)
MCHC: 32 g/dL (ref 30.0–36.0)
MCV: 87.1 fL (ref 80.0–100.0)
Platelets: 207 10*3/uL (ref 150–400)
RBC: 4.66 MIL/uL (ref 4.22–5.81)
RDW: 14.3 % (ref 11.5–15.5)
WBC: 6.7 10*3/uL (ref 4.0–10.5)
nRBC: 0 % (ref 0.0–0.2)

## 2023-02-21 LAB — CREATININE, SERUM
Creatinine, Ser: 1.22 mg/dL (ref 0.61–1.24)
GFR, Estimated: >60 mL/min (ref >60)

## 2023-02-21 MED ORDER — ACETAMINOPHEN 650 MG RE SUPP: 650.0000 mg | RECTAL | Status: DC | PRN

## 2023-02-21 MED ORDER — OYSTER SHELL CALCIUM/D3 500-5 MG-MCG PO TABS
1.0000 | ORAL_TABLET | Freq: Every day | ORAL | Status: DC
  Administered 2023-02-22 – 2023-03-28 (×35): 1 via ORAL
  Filled 2023-02-21 (×36): qty 1

## 2023-02-21 MED ORDER — ASPIRIN 81 MG PO TBEC
81.0000 mg | DELAYED_RELEASE_TABLET | Freq: Every day | ORAL | Status: DC
  Administered 2023-02-22 – 2023-03-04 (×11): 81 mg via ORAL
  Filled 2023-02-21 (×11): qty 1

## 2023-02-21 MED ORDER — ASPIRIN 81 MG PO TBEC: 81.0000 mg | DELAYED_RELEASE_TABLET | Freq: Every day | ORAL | 0 refills | Status: DC

## 2023-02-21 MED ORDER — CLOPIDOGREL BISULFATE 75 MG PO TABS
75.0000 mg | ORAL_TABLET | Freq: Every day | ORAL | Status: DC
  Administered 2023-02-22 – 2023-03-04 (×11): 75 mg via ORAL
  Filled 2023-02-21 (×11): qty 1

## 2023-02-21 MED ORDER — SENNOSIDES-DOCUSATE SODIUM 8.6-50 MG PO TABS: 1.0000 | ORAL_TABLET | Freq: Every evening | ORAL | Status: DC | PRN

## 2023-02-21 MED ORDER — OYSTER SHELL CALCIUM/D3 500-5 MG-MCG PO TABS: 1.0000 | ORAL_TABLET | Freq: Every day | ORAL | 0 refills | Status: DC

## 2023-02-21 MED ORDER — ALLOPURINOL 100 MG PO TABS
300.0000 mg | ORAL_TABLET | Freq: Every morning | ORAL | Status: DC
  Administered 2023-02-22 – 2023-03-27 (×34): 300 mg via ORAL
  Filled 2023-02-21 (×35): qty 3

## 2023-02-21 MED ORDER — HEPARIN SODIUM (PORCINE) 5000 UNIT/ML IJ SOLN: 5000.0000 [IU] | Freq: Three times a day (TID) | INTRAMUSCULAR | Status: DC

## 2023-02-21 MED ORDER — SALINE SPRAY 0.65 % NA SOLN
1.0000 | NASAL | Status: DC | PRN
  Filled 2023-02-21: qty 44

## 2023-02-21 MED ORDER — AMLODIPINE BESYLATE 10 MG PO TABS
10.0000 mg | ORAL_TABLET | Freq: Every day | ORAL | Status: DC
  Administered 2023-02-22 – 2023-03-05 (×12): 10 mg via ORAL
  Filled 2023-02-21 (×12): qty 1

## 2023-02-21 MED ORDER — MIRABEGRON ER 25 MG PO TB24
50.0000 mg | ORAL_TABLET | Freq: Every day | ORAL | Status: DC
  Administered 2023-02-22 – 2023-03-27 (×34): 50 mg via ORAL
  Filled 2023-02-21 (×34): qty 2

## 2023-02-21 MED ORDER — SPIRONOLACTONE 25 MG PO TABS: 25.0000 mg | ORAL_TABLET | Freq: Every day | ORAL | 0 refills | Status: DC

## 2023-02-21 MED ORDER — ONDANSETRON 4 MG PO TBDP
4.0000 mg | ORAL_TABLET | Freq: Three times a day (TID) | ORAL | Status: DC | PRN
  Administered 2023-02-24 – 2023-03-10 (×8): 4 mg via ORAL
  Filled 2023-02-21 (×8): qty 1

## 2023-02-21 MED ORDER — LATANOPROST 0.005 % OP SOLN
1.0000 [drp] | Freq: Every day | OPHTHALMIC | Status: DC
  Administered 2023-02-21 – 2023-03-27 (×34): 1 [drp] via OPHTHALMIC
  Filled 2023-02-21: qty 2.5

## 2023-02-21 MED ORDER — QUETIAPINE FUMARATE 50 MG PO TABS
50.0000 mg | ORAL_TABLET | Freq: Every day | ORAL | Status: DC
  Administered 2023-02-21 – 2023-02-23 (×3): 50 mg via ORAL
  Filled 2023-02-21 (×3): qty 1

## 2023-02-21 MED ORDER — SPIRONOLACTONE 25 MG PO TABS
25.0000 mg | ORAL_TABLET | Freq: Every day | ORAL | Status: DC
  Administered 2023-02-22 – 2023-02-23 (×2): 25 mg via ORAL
  Filled 2023-02-21 (×2): qty 1

## 2023-02-21 MED ORDER — SENNOSIDES-DOCUSATE SODIUM 8.6-50 MG PO TABS
1.0000 | ORAL_TABLET | Freq: Every day | ORAL | Status: DC
  Administered 2023-02-21 – 2023-03-27 (×32): 1 via ORAL
  Filled 2023-02-21 (×35): qty 1

## 2023-02-21 MED ORDER — ACETAMINOPHEN 325 MG PO TABS
650.0000 mg | ORAL_TABLET | ORAL | Status: DC | PRN
  Administered 2023-02-24 – 2023-03-27 (×11): 650 mg via ORAL
  Filled 2023-02-21 (×12): qty 2

## 2023-02-21 MED ORDER — METOPROLOL SUCCINATE ER 100 MG PO TB24: 100.0000 mg | ORAL_TABLET | Freq: Every day | ORAL | 0 refills | Status: DC

## 2023-02-21 MED ORDER — LEVOTHYROXINE SODIUM 88 MCG PO TABS
88.0000 ug | ORAL_TABLET | Freq: Every day | ORAL | Status: DC
  Administered 2023-02-22 – 2023-03-28 (×35): 88 ug via ORAL
  Filled 2023-02-21 (×35): qty 1

## 2023-02-21 MED ORDER — HEPARIN SODIUM (PORCINE) 5000 UNIT/ML IJ SOLN
5000.0000 [IU] | Freq: Three times a day (TID) | INTRAMUSCULAR | Status: DC
  Administered 2023-02-21 – 2023-03-04 (×33): 5000 [IU] via SUBCUTANEOUS
  Filled 2023-02-21 (×34): qty 1

## 2023-02-21 MED ORDER — LATANOPROST 0.005 % OP SOLN: 1.0000 [drp] | Freq: Every day | OPHTHALMIC | 12 refills | Status: AC

## 2023-02-21 MED ORDER — POLYETHYLENE GLYCOL 3350 17 G PO PACK
17.0000 g | PACK | Freq: Every day | ORAL | Status: DC
Start: 2023-02-22 — End: 2023-03-28
  Administered 2023-02-22 – 2023-03-28 (×30): 17 g via ORAL
  Filled 2023-02-21 (×33): qty 1

## 2023-02-21 MED ORDER — QUETIAPINE FUMARATE 25 MG PO TABS
25.0000 mg | ORAL_TABLET | Freq: Every evening | ORAL | Status: DC | PRN
  Administered 2023-02-27: 25 mg via ORAL
  Filled 2023-02-21 (×2): qty 1

## 2023-02-21 MED ORDER — LEVOTHYROXINE SODIUM 88 MCG PO TABS: 88.0000 ug | ORAL_TABLET | Freq: Every day | ORAL | 0 refills | Status: DC

## 2023-02-21 MED ORDER — CLOPIDOGREL BISULFATE 75 MG PO TABS: 75.0000 mg | ORAL_TABLET | Freq: Every day | ORAL | 1 refills | Status: DC

## 2023-02-21 MED ORDER — AMLODIPINE BESYLATE 10 MG PO TABS: 10.0000 mg | ORAL_TABLET | Freq: Every day | ORAL | 0 refills | Status: DC

## 2023-02-21 MED ORDER — ACETAMINOPHEN 160 MG/5ML PO SOLN
650.0000 mg | ORAL | Status: DC | PRN
  Filled 2023-02-21: qty 20.3

## 2023-02-21 MED ORDER — ENSURE ENLIVE PO LIQD: 237.0000 mL | Freq: Two times a day (BID) | ORAL | 12 refills | Status: DC

## 2023-02-21 MED ORDER — METOPROLOL SUCCINATE ER 50 MG PO TB24
100.0000 mg | ORAL_TABLET | Freq: Every day | ORAL | Status: DC
  Administered 2023-02-22 – 2023-03-28 (×35): 100 mg via ORAL
  Filled 2023-02-21 (×35): qty 2

## 2023-02-21 MED ORDER — ATORVASTATIN CALCIUM 80 MG PO TABS
80.0000 mg | ORAL_TABLET | Freq: Every day | ORAL | Status: DC
  Administered 2023-02-22 – 2023-03-28 (×35): 80 mg via ORAL
  Filled 2023-02-21 (×35): qty 1

## 2023-02-21 MED ORDER — ENSURE ENLIVE PO LIQD
237.0000 mL | Freq: Two times a day (BID) | ORAL | Status: DC
  Administered 2023-02-21 – 2023-03-27 (×47): 237 mL via ORAL

## 2023-02-21 MED ORDER — LIDOCAINE 5 % EX PTCH
1.0000 | MEDICATED_PATCH | CUTANEOUS | Status: DC
  Administered 2023-02-22 – 2023-03-28 (×35): 1 via TRANSDERMAL
  Filled 2023-02-21 (×35): qty 1

## 2023-02-21 NOTE — Progress Notes (Addendum)
 Inpatient Rehabilitation Discharge Medication Review by a Pharmacist  A complete drug regimen review was completed for this patient to identify any potential clinically significant medication issues.  High Risk Drug Classes Is patient taking? Indication by Medication  Antipsychotic Yes Quetiapine  (scheduled and prn) - mood, sundowning, sleep  Anticoagulant Yes SQ heparin  - VTE prophylaxis  Antibiotic No   Opioid No   Antiplatelet Yes Aspirin  81 mg, clopidogrel  for 3 weeks, then Clopidogrel  alone - CVA prophylaxis. Edward Rodgers 02/16/23)  Hypoglycemics/insulin No   Vasoactive Medication Yes Amlodipine , Metoprolol  XL, spironolactone   - hypertension, heart failure  Chemotherapy No   Other Yes Allopurinol  - gout Atorvastatin  - hyperlipidemia Latanoprost  to R eye - glaucoma Levothyroxine  - hypothyroidism Lidocaine  patch (shoulder) - topical pain relief Mirabegron  - overactive bladder Calcium  + Vitamin D - supplement Miralax , senna-docusate - laxatives  PRNs: Acetaminophen  - mild pain or temp > 99.14F Ondansetron  - nausea, vomiting Sodium Chloride  nasal spray - congestion     Type of Medication Issue Identified Description of Issue Recommendation(s)  Drug Interaction(s) (clinically significant)     Duplicate Therapy     Allergy     No Medication Administration End Date  Asprin 81 mg and Clopidogrel  x  3 weeks, then clopidogrel  alone. Begun 02/16/23.  Aspirin  to stop 03/08/23.  Incorrect Dose     Additional Drug Therapy Needed  Effexor  XR 150 mg daily Continue as planned > done 2/9  Significant med changes from prior encounter (inform family/care partners about these prior to discharge). New clopidogrel , amlodipine , lidocaine  patch, quetiapine . Aspirin  325 > 81 mg STOP: meloxicam  Communicate changes with patient/family prior to discharge.  Other  Per dc summary, to resume: - Jardiance (for heart failure)  - weekly Fosamax  Also notes to discontinue Mirabegron , but has been on daily  since admitted.  Resume if/when clincally indicated.  Resume after discharge.    Clinically significant medication issues were identified that warrant physician communication and completion of prescribed/recommended actions by midnight of the next day:  Yes  Name of provider notified for urgent issues identified: Jereld Rubens, PA-C   Provider Method of Notification: secure chat  Pharmacist comments:  - Venlafaxine  XR 150 mg every evening appears to have been signed and held for transfer to CIR, but not on current med list: has been on daily since admitted. Will follow up on 02/22/23.  2/9 Addendum: - per M. Street, PA-C, resume Effexor  XR.   Time spent performing this drug regimen review (minutes):  [address]   Genaro Zebedee Calin, [place] 02/21/2023 6:37 PM  02/22/2023 8:23 AM   Addendum: - per M. Street, PA-C, resume Effexor  XR.

## 2023-02-21 NOTE — H&P (Signed)
 Physical Medicine and Rehabilitation Admission H&P         Chief Complaint  Patient presents with   Code Stroke      LKW : 10 pm 02/13/23  : HPI: Edward Rodgers is a 73 year old right-handed male with history of diastolic congestive heart failure, gout, hypothyroidism, prior cerebellar infarct with residual diplopia maintained on full-strength aspirin , prostate cancer, tobacco use.  Per chart review patient lives with spouse.  Two-level home bed and bath on main level one-step to entry.  Modified independent prior to admission.  Presented to 02/02/2023 with left-sided weakness numbness and disconjugate gaze.  CT/MRI showed diffusion hyperintensity in the right PCA distribution affecting the medial temporal lobe and thalamus.  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  CTA right P2 occlusion presumably acute in the setting.  Extensive intracranial atherosclerosis.  CT perfusion of the brain showed increased ischemic range perfusion on the right PCA distribution measuring 36 cc as compared to 25 cc on prior imaging.  Admission chemistries unremarkable except glucose 115 BUN 24, hemoglobin A1c 6.4.  Echocardiogram with ejection fraction of 55% no wall motion abnormalities grade 1 diastolic dysfunction.  Patient did receive TNK.  MRI follow-up revealed progressive acute right PCA territory infarct with extension most notable in the right periatrial white matter and occipital lobe.  There was suspected subcentimeter hemorrhage centrally in the right thalamic infarct.  New punctate infarct in the left occipital lobe also seen.  Neurology follow-up placed on low-dose aspirin  and Plavix  for CVA prophylaxis x 3 weeks then Plavix  alone.  Subcutaneous heparin  for DVT prophylaxis.  Await plan for loop recorder.  Mild AKI 1.20-1.44 placed on gentle IV fluids since discontinued with latest creatinine 1.23.  Therapy evaluations completed due to patient decreased functional mobility and  left-sided weakness was admitted for a comprehensive rehab program.   Review of Systems  Constitutional:  Negative for chills and fever.  HENT:  Negative for hearing loss.   Eyes:  Positive for blurred vision and double vision.  Respiratory:  Negative for cough, shortness of breath and wheezing.   Cardiovascular:  Negative for palpitations and leg swelling.  Gastrointestinal:  Positive for constipation. Negative for heartburn, nausea and vomiting.  Genitourinary:  Positive for urgency. Negative for dysuria, flank pain and hematuria.  Musculoskeletal:  Positive for myalgias.  Skin:  Negative for rash.  Neurological:  Positive for dizziness, sensory change and weakness.  Psychiatric/Behavioral:  Positive for depression.   All other systems reviewed and are negative.       Past Medical History:  Diagnosis Date   Cancer (HCC) 2002    prostate   CHF (congestive heart failure) (HCC)     Gout     Hypertension     Thyroid  disease               Past Surgical History:  Procedure Laterality Date   COLONOSCOPY N/A 07/15/2017    Procedure: COLONOSCOPY;  Surgeon: Golda Claudis PENNER, MD;  Location: [facility];  Service: Endoscopy;  Laterality: N/A;  830   IR CT HEAD LTD   02/14/2023   IR PERCUTANEOUS ART THROMBECTOMY/INFUSION INTRACRANIAL INC DIAG ANGIO   02/14/2023   POLYPECTOMY   07/15/2017    Procedure: POLYPECTOMY;  Surgeon: Golda Claudis PENNER, MD;  Location: [facility];  Service: Endoscopy;;  colon   PROSTATE SURGERY       RADIOLOGY WITH ANESTHESIA Left 02/14/2023    Procedure: IR WITH ANESTHESIA;  Surgeon: Radiologist, Medication,  MD;  Location: [facility];  Service: Radiology;  Laterality: Left;   RADIOLOGY WITH ANESTHESIA N/A 02/14/2023    Procedure: IR WITH ANESTHESIA;  Surgeon: Radiologist, Medication, MD;  Location: [facility];  Service: Radiology;  Laterality: N/A;        No family history on file.     Social History:  reports that he has quit smoking. He has never used smokeless tobacco. He  reports that he does not drink alcohol and does not use drugs. Allergies:  Allergies  No Known Allergies         Medications Prior to Admission  Medication Sig Dispense Refill   alendronate (FOSAMAX) 70 MG tablet Take 70 mg by mouth once a week. Take with a full glass of water  on an empty stomach. mondays       allopurinol  (ZYLOPRIM ) 300 MG tablet Take 300 mg by mouth every morning.       aspirin  325 MG tablet Take 1 tablet (325 mg total) by mouth daily. 30 tablet 5   atorvastatin  (LIPITOR ) 80 MG tablet Take 80 mg by mouth daily.       Calcium  Carb-Cholecalciferol  (CALCIUM  600 + D PO) Take 1 tablet by mouth in the morning and at bedtime.       empagliflozin (JARDIANCE) 25 MG TABS tablet Take 12.5 mg by mouth daily.       latanoprost  (XALATAN ) 0.005 % ophthalmic solution Place 1 drop into the right eye at bedtime.       levothyroxine  (SYNTHROID ) 88 MCG tablet Take 88 mcg by mouth daily before breakfast.       meloxicam  (MOBIC ) 7.5 MG tablet Take 7.5 mg by mouth daily.       metoprolol  succinate (TOPROL -XL) 100 MG 24 hr tablet Take 100 mg by mouth daily. Take with or immediately following a meal.       mirabegron  ER (MYRBETRIQ ) 50 MG TB24 tablet Take 50 mg by mouth daily.       spironolactone  (ALDACTONE ) 25 MG tablet Take 25 mg by mouth daily.       venlafaxine  XR (EFFEXOR -XR) 150 MG 24 hr capsule Take 150 mg by mouth every evening.                  Home: Home Living Family/patient expects to be discharged to:: Private residence Living Arrangements: Spouse/significant other Available Help at Discharge: Family, Available PRN/intermittently Type of Home: House Home Access: Stairs to enter Secretary/administrator of Steps: 1 Entrance Stairs-Rails: None Home Layout: Two level, Able to live on main level with bedroom/bathroom, Laundry or work area in basement Alternate Teacher, Music of Steps: wood stove in basement Alternate Level Stairs-Rails: Right Bathroom Shower/Tub:  Engineer, Manufacturing Systems: Standard Bathroom Accessibility: Yes Home Equipment: Information systems manager, Medical Laboratory Scientific Officer - quad, Hand held shower head  Lives With: Spouse   Functional History: Prior Function Prior Level of Function : Independent/Modified Independent Mobility Comments: usses cane when he thinks about it ADLs Comments: Pt reports mod I for ADL and medication management.   Functional Status:  Mobility: Bed Mobility Overal bed mobility: Needs Assistance Bed Mobility: Rolling, Sidelying to Sit Rolling: Max assist (to pts left) Sidelying to sit: Max assist Supine to sit: Mod assist Sit to supine: Max assist General bed mobility comments: assist for trunk elevation/lowering, LLE progression to/from EOB, bringing LUE into lap when moving to EOB, scooting to EOB with assist of bed pad. Transfers Overall transfer level: Needs assistance Equipment used: 2 person hand held assist Transfers:  Sit to/from Stand, Bed to chair/wheelchair/BSC Sit to Stand: Mod assist, +2 physical assistance Bed to/from chair/wheelchair/BSC transfer type:: Squat pivot Stand pivot transfers: Max assist, +2 physical assistance Squat pivot transfers: Max assist Transfer via Lift Equipment: Stedy General transfer comment: +2 max A using Camie Ip from bed to chair Ambulation/Gait General Gait Details: unable   ADL: ADL Overall ADL's : Needs assistance/impaired Eating/Feeding: Moderate assistance, Sitting Grooming: Sitting, Maximal assistance Upper Body Bathing: Maximal assistance, Sitting Lower Body Bathing: Total assistance, +2 for physical assistance, +2 for safety/equipment, Sit to/from stand, Sitting/lateral leans Upper Body Dressing : Maximal assistance, Sitting Lower Body Dressing: Total assistance, +2 for safety/equipment, +2 for physical assistance Toilet Transfer: Maximal assistance, +2 for physical assistance, +2 for safety/equipment, Stand-pivot Functional mobility during ADLs: +2 for physical  assistance, Maximal assistance (Used steady for transfer from bed to chair) General ADL Comments: limited by attention, vision, L hemi   Cognition: Cognition Overall Cognitive Status: Impaired/Different from baseline Arousal/Alertness: Lethargic Orientation Level: Oriented to person, Oriented to place, Oriented to situation Year: Other (Comment) (2005) Day of Week: Correct Attention: Sustained, Focused Focused Attention: Appears intact Sustained Attention: Impaired Sustained Attention Impairment: Verbal complex Memory: Impaired Memory Impairment: Decreased short term memory Decreased Short Term Memory: Verbal basic Awareness: Impaired Awareness Impairment: Emergent impairment Problem Solving: Impaired Problem Solving Impairment: Verbal complex Executive Function: Landscape Architect: Impaired Organizing Impairment: Functional complex Safety/Judgment: Appears intact Cognition Arousal: Alert Behavior During Therapy: WFL for tasks assessed/performed Overall Cognitive Status: Impaired/Different from baseline Area of Impairment: Attention, Safety/judgement, Orientation Orientation Level: Person, Situation, Place Current Attention Level: Sustained Safety/Judgement: Decreased awareness of deficits, Decreased awareness of safety General Comments: Patient able to follow one step commands   Physical Exam: Blood pressure (!) 136/103, pulse 70, temperature 97.8 F (36.6 C), temperature source Oral, resp. rate 18, height 5' 9 (1.753 m), weight 101.2 kg, SpO2 91%. Physical Exam Constitutional:      General: He is not in acute distress.    Appearance: He is not ill-appearing.  HENT:     Head: Normocephalic and atraumatic.     Right Ear: External ear normal.     Left Ear: External ear normal.     Nose: Nose normal.     Mouth/Throat:     Mouth: Mucous membranes are moist.  Eyes:     Extraocular Movements: Extraocular movements intact.     Conjunctiva/sclera: Conjunctivae normal.      Pupils: Pupils are equal, round, and reactive to light.  Cardiovascular:     Rate and Rhythm: Normal rate and regular rhythm.     Heart sounds: Murmur heard.  Pulmonary:     Effort: Pulmonary effort is normal. No respiratory distress.     Breath sounds: No stridor. No wheezing or rhonchi.  Abdominal:     General: Bowel sounds are normal. There is no distension.     Palpations: Abdomen is soft.     Tenderness: There is no abdominal tenderness.  Musculoskeletal:        General: Swelling (trace LUE) present.     Cervical back: Normal range of motion.     Left lower leg: Edema present.  Skin:    General: Skin is warm and dry.     Findings: No bruising or erythema.  Neurological:     Mental Status: He is alert.     Comments: Pt is alert. Oriented to person, place, month/year. Knew why he was here. Told me who he was rooting for in [employer] tomorrow.  Was a little drowsy, nodded off a few times during my visit. Speech dysarthric. Language appears intact. Fair insight and awareness. Left central 7 and tongue deviation. LUE 1/5 prox to distal. LLLE 1 to 1+/5 prox to distal. RUE 4/5, RLE 4- to 4/5. Decreased LT and pain in left leg and arm as wel. DTR's 2+ on left. Toes up. Heel cord a little tight, wearing PRAFO. No resting tone appreciated.    Psychiatric:     Comments: Pt pleasant and cooperative        Lab Results Last 48 Hours        Results for orders placed or performed during the hospital encounter of 02/14/23 (from the past 48 hours)  Basic metabolic panel     Status: Abnormal    Collection Time: 02/19/23  5:37 AM  Result Value Ref Range    Sodium 136 135 - 145 mmol/L    Potassium 4.2 3.5 - 5.1 mmol/L    Chloride 103 98 - 111 mmol/L    CO2 23 22 - 32 mmol/L    Glucose, Bld 106 (H) 70 - 99 mg/dL      Comment: Glucose reference range applies only to samples taken after fasting for at least 8 hours.    BUN 16 8 - 23 mg/dL    Creatinine, Ser 8.76 0.61 - 1.24 mg/dL     Calcium  9.7 8.9 - 10.3 mg/dL    GFR, Estimated >39 >39 mL/min      Comment: (NOTE) Calculated using the CKD-EPI Creatinine Equation (2021)      Anion gap 10 5 - 15      Comment: Performed at [facility], [address] [place], [place] [address]  CBC     Status: None    Collection Time: 02/19/23  5:37 AM  Result Value Ref Range    WBC 5.8 4.0 - 10.5 K/uL    RBC 4.65 4.22 - 5.81 MIL/uL    Hemoglobin 13.0 13.0 - 17.0 g/dL    HCT 58.8 60.9 - 47.9 %    MCV 88.4 80.0 - 100.0 fL    MCH 28.0 26.0 - 34.0 pg    MCHC 31.6 30.0 - 36.0 g/dL    RDW 85.3 88.4 - 84.4 %    Platelets 181 150 - 400 K/uL    nRBC 0.0 0.0 - 0.2 %      Comment: Performed at [facility], [address], [place], [place] [address]      Imaging Results (Last 48 hours)  No results found.     Rbetriq 50 mg daslu   Blood pressure (!) 136/103, pulse 70, temperature 97.8 F (36.6 C), temperature source Oral, resp. rate 18, height 5' 9 (1.753 m), weight 101.2 kg, SpO2 91%.   Medical Problem List and Plan: 1. 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  Await plan for loop recorder             -patient may shower             -ELOS/Goals: 21-25 days, min assist with PT, OT and supervision with SLP 2.  Antithrombotics: -DVT/anticoagulation:  Pharmaceutical: Heparin              -antiplatelet therapy: Aspirin  81 mg daily and Plavix  75 mg day x 3 weeks then Plavix  alone 3. Pain Management: Lidoderm  patch as directed, Tylenol  as needed 4. Mood/Sleep/sundowning: Effexor  150 mg every  evening provide emotional support             -antipsychotic agents: Seroquel  50 mg nighty at 2030, have back up dose             -keep sleep chart 5. Neuropsych/cognition: This patient is capable of making decisions on his own behalf. 6. Skin/Wound Care: Routine skin checks 7. Fluids/Electrolytes/Nutrition: Routine in and outs with follow-up  chemistries 8.  Hypothyroidism.  Synthroid  9.  Hypertension.Norvasc  10 mg daily,Toprol -Xl 100 mg daily,Aldactone  25 mg daily.             -bp trending up. Likely will need additions to regimen 10.H/O prostate cancer..Myrbetriq  50 mg daily. 11.Hyperlipidemia.Lipitor  12.Constipation.             -+bm today,              -colace daily 13.H/O gout.Zyloprim    Toribio PARAS Angiulli, PA-C 02/20/2023  I have personally performed a face to face diagnostic evaluation of this patient and formulated the key components of the plan.  Additionally, I have personally reviewed laboratory data, imaging studies, as well as relevant notes and concur with the physician assistant's documentation above.  The patient's status has not changed from the original H&P.  Any changes in documentation from the acute care chart have been noted above.  Arthea IVAR Gunther, MD, [facility]LL[facility]

## 2023-02-21 NOTE — Discharge Summary (Addendum)
 Stroke Discharge Summary  Patient ID: Edward Rodgers   MRN: [id number]      DOB: 1950/08/26  Date of Admission: 02/14/2023 Date of Discharge: 02/21/2023  Attending Physician:  Stroke, Md, MD Consultant(s):    cardiology  Patient's PCP:  Pcp, No  DISCHARGE PRIMARY DIAGNOSIS: Acute ischemic infarctions. Scattered infarctions involving the right PCA territory due to right P2 occlusion, right thalamic infarct with subcentimeter hemorrhagic transformation, and punctate left PCA infarcts s/p TNK and IR.  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% Severe cervical stenosis Hyperlipidemia AKI Delirium with sundowning Obesity BMI 32.95 kg/m  Allergies as of 02/21/2023   No Known Allergies      Medication List     STOP taking these medications    aspirin  325 MG tablet Replaced by: aspirin  EC 81 MG tablet   CALCIUM  600 + D PO Replaced by: calcium -vitamin D 500-5 MG-MCG tablet   levothyroxine  50 MCG tablet Commonly known as: SYNTHROID  Replaced by: levothyroxine  88 MCG tablet   meloxicam  7.5 MG tablet Commonly known as: MOBIC    Myrbetriq  50 MG Tb24 tablet Generic drug: mirabegron  ER       TAKE these medications    alendronate 70 MG tablet Commonly known as: FOSAMAX Take 70 mg by mouth once a week. Take with a full glass of water  on an empty stomach. mondays   allopurinol  300 MG tablet Commonly known as: ZYLOPRIM  Take 300 mg by mouth every morning.   amLODipine  10 MG tablet Commonly known as: NORVASC  Take 1 tablet (10 mg total) by mouth daily. Start taking on: February 22, 2023   aspirin  EC 81 MG tablet Take 1 tablet (81 mg total) by mouth daily. Swallow whole. Start taking on: February 22, 2023 Replaces: aspirin  325 MG tablet   atorvastatin  80 MG tablet Commonly known as: LIPITOR  Take 80 mg by mouth daily.   calcium -vitamin D 500-5 MG-MCG tablet Commonly known as: OSCAL WITH D Take 1 tablet by mouth daily. Start taking on: February 22, 2023 Replaces: CALCIUM  600 + D PO   clopidogrel  75 MG tablet Commonly known as: PLAVIX  Take 1 tablet (75 mg total) by mouth daily. Start taking on: February 22, 2023   empagliflozin 25 MG Tabs tablet Commonly known as: JARDIANCE Take 12.5 mg by mouth daily.   feeding supplement Liqd Take 237 mLs by mouth 2 (two) times daily between meals.   latanoprost  0.005 % ophthalmic solution Commonly known as: XALATAN  Place 1 drop into the right eye at bedtime.   levothyroxine  88 MCG tablet Commonly known as: SYNTHROID  Take 1 tablet (88 mcg total) by mouth daily before breakfast. Start taking on: February 22, 2023 Replaces: levothyroxine  50 MCG tablet   metoprolol  succinate 100 MG 24 hr tablet Commonly known as: TOPROL -XL Take 1 tablet (100 mg total) by mouth daily. Take with or immediately following a meal. Start taking on: February 22, 2023   spironolactone  25 MG tablet Commonly known as: ALDACTONE  Take 1 tablet (25 mg total) by mouth daily. Start taking on: February 22, 2023   venlafaxine  XR 150 MG 24 hr capsule Commonly known as: EFFEXOR -XR Take 150 mg by mouth every evening.       LABORATORY STUDIES CBC    Component Value Date/Time   WBC 5.8 02/19/2023 0537   RBC 4.65 02/19/2023 0537   HGB 13.0 02/19/2023 0537   HCT 41.1 02/19/2023 0537   PLT 181 02/19/2023 0537   MCV 88.4 02/19/2023 0537  MCH 28.0 02/19/2023 0537   MCHC 31.6 02/19/2023 0537   RDW 14.6 02/19/2023 0537   LYMPHSABS 1.2 02/14/2023 0406   MONOABS 0.7 02/14/2023 0406   EOSABS 0.2 02/14/2023 0406   BASOSABS 0.0 02/14/2023 0406   CMP    Component Value Date/Time   NA 136 02/19/2023 0537   K 4.2 02/19/2023 0537   CL 103 02/19/2023 0537   CO2 23 02/19/2023 0537   GLUCOSE 106 (H) 02/19/2023 0537   BUN 16 02/19/2023 0537   CREATININE 1.23 02/19/2023 0537   CALCIUM  9.7 02/19/2023 0537   PROT 6.8 02/14/2023 0406   ALBUMIN 3.6 02/14/2023 0406   AST 19 02/14/2023 0406   ALT 19 02/14/2023 0406    ALKPHOS 46 02/14/2023 0406   BILITOT 0.2 02/14/2023 0406   GFRNONAA >60 02/19/2023 0537   GFRAA >90 03/29/2013 0711   COAGS Lab Results  Component Value Date   INR 1.0 02/14/2023   Lipid Panel    Component Value Date/Time   CHOL 96 02/15/2023 0501   TRIG 146 02/15/2023 0501   HDL 37 (L) 02/15/2023 0501   CHOLHDL 2.6 02/15/2023 0501   VLDL 29 02/15/2023 0501   LDLCALC 30 02/15/2023 0501   HgbA1C  Lab Results  Component Value Date   HGBA1C 6.4 (H) 02/15/2023   Urine Drug Screen Drugs of Abuse     Component Value Date/Time   LABOPIA NONE DETECTED 02/14/2023 0615   COCAINSCRNUR NONE DETECTED 02/14/2023 0615   LABBENZ NONE DETECTED 02/14/2023 0615   AMPHETMU NONE DETECTED 02/14/2023 0615   THCU NONE DETECTED 02/14/2023 0615   LABBARB NONE DETECTED 02/14/2023 0615    Alcohol Level    Component Value Date/Time   ETH <10 02/14/2023 0406   SIGNIFICANT DIAGNOSTIC STUDIES VAS US  LOWER EXTREMITY VENOUS (DVT) Result Date: 02/17/2023  Lower Venous DVT Study Patient Name:  Edward Rodgers  Date of Exam:   02/17/2023 Medical Rec #: [id number]       Accession #:    [id number] Date of Birth: [birth year]       Patient Gender: M Patient Age:   73 years Exam Location:  [facility] Procedure:      VAS US  LOWER EXTREMITY VENOUS (DVT) Referring Phys: ARY XU --------------------------------------------------------------------------------  Indications: Stroke, and Embolic stroke.  Comparison Study: No prior exam. Performing Technologist: Edilia Elden Appl  Examination Guidelines: A complete evaluation includes B-mode imaging, spectral Doppler, color Doppler, and power Doppler as needed of all accessible portions of each vessel. Bilateral testing is considered an integral part of a complete examination. Limited examinations for reoccurring indications may be performed as noted. The reflux portion of the exam is performed with the patient in reverse Trendelenburg.   +---------+---------------+---------+-----------+----------+--------------+ RIGHT    CompressibilityPhasicitySpontaneityPropertiesThrombus Aging +---------+---------------+---------+-----------+----------+--------------+ CFV      Full           Yes      Yes                                 +---------+---------------+---------+-----------+----------+--------------+ SFJ      Full           Yes      Yes                                 +---------+---------------+---------+-----------+----------+--------------+ FV Prox  Full                                                        +---------+---------------+---------+-----------+----------+--------------+  FV Mid   Full                                                        +---------+---------------+---------+-----------+----------+--------------+ FV DistalFull                                                        +---------+---------------+---------+-----------+----------+--------------+ PFV      Full                                                        +---------+---------------+---------+-----------+----------+--------------+ POP      Full           Yes      Yes                                 +---------+---------------+---------+-----------+----------+--------------+ PTV      Full                                                        +---------+---------------+---------+-----------+----------+--------------+ PERO     Full                                                        +---------+---------------+---------+-----------+----------+--------------+   +---------+---------------+---------+-----------+----------+--------------+ LEFT     CompressibilityPhasicitySpontaneityPropertiesThrombus Aging +---------+---------------+---------+-----------+----------+--------------+ CFV      Full           Yes      Yes                                  +---------+---------------+---------+-----------+----------+--------------+ SFJ      Full           Yes      Yes                                 +---------+---------------+---------+-----------+----------+--------------+ FV Prox  Full                                                        +---------+---------------+---------+-----------+----------+--------------+ FV Mid   Full                                                        +---------+---------------+---------+-----------+----------+--------------+  FV DistalFull                                                        +---------+---------------+---------+-----------+----------+--------------+ PFV      Full                                                        +---------+---------------+---------+-----------+----------+--------------+ POP      Full           Yes      Yes                                 +---------+---------------+---------+-----------+----------+--------------+ PTV      Full                                                        +---------+---------------+---------+-----------+----------+--------------+ PERO     Full                                                        +---------+---------------+---------+-----------+----------+--------------+     Summary: BILATERAL: - No evidence of deep vein thrombosis seen in the lower extremities, bilaterally. -No evidence of popliteal cyst, bilaterally.   *See table(s) above for measurements and observations. Electronically signed by Norman Serve on 02/17/2023 at 1:52:27 PM.    Final    IR PERCUTANEOUS ART THROMBECTOMY/INFUSION INTRACRANIAL INC DIAG ANGIO Result Date: 02/17/2023 INDICATION: Probable recent left-sided weakness with numbness.  Dysarthria. Occluded right posterior cerebral artery P2 segment on CT angiogram of the head and neck. EXAM: 1. EMERGENT LARGE VESSEL OCCLUSION THROMBOLYSIS (POSTERIOR CIRCULATION) COMPARISON:  CT angiogram of  the head and neck 02/14/2023. MEDICATIONS: No antibiotic was administered within 1 hour of the procedure. ANESTHESIA/SEDATION: General anesthesia. CONTRAST:  Omnipaque  300 approximately 35 mL. FLUOROSCOPY TIME:  Fluoroscopy Time: 26 minutes 18 seconds (1112 mGy). COMPLICATIONS: None immediate. TECHNIQUE: Following a full explanation of the procedure along with the potential associated complications, an informed witnessed consent was obtained. The risks of intracranial hemorrhage of 10%, worsening neurological deficit, ventilator dependency, death and inability to revascularize were all reviewed in detail with the patient's spouse. The patient was then put under general anesthesia by the Department of Anesthesiology at [facility]. The right groin was prepped and draped in the usual sterile fashion. Thereafter using modified Seldinger technique, transfemoral access into the right common femoral artery was obtained without difficulty. Over an 0.035 inch guidewire an 8 French 25 cm Pinnacle sheath was inserted. Through this, and also over an 0.035 inch guidewire a combination of a 125 cm Berenstein support catheter inside of an 088 90 cm Neuron Max sheath was advanced to the left subclavian artery. The support catheter, and the wire were removed. An arteriogram was then performed through the Neuron  Max sheath in the proximal left subclavian artery. FINDINGS: The left vertebral artery origin is widely patent with moderate tortuosity just distal to this. More distally, there is approximately 50% stenosis of the mid cervical left vertebral artery. Distal to this the left vertebrobasilar junction, the left posterior-inferior cerebellar artery and the tortuous basilar artery demonstrate wide patency. The left posterior cerebral artery, the superior cerebellar arteries and the anterior-inferior cerebellar arteries opacify into the capillary and venous phases. The right posterior cerebral artery demonstrates occlusion  of the distal P1 segment. Retrograde opacification of the right vertebrobasilar junction to the level of the right posterior-inferior cerebellar artery is noted. PROCEDURE: Through the Neuron Max sheath in the distal left vertebral artery, a combination of an 062 132 cm aspiration catheter with an inner 160 cm 043 distal aspiration catheter was advanced over an 018 inch micro guidewire with moderate J configuration to the distal basilar artery. The micro guidewire was then advanced through the occluded right posterior cerebral artery distal P2 segment followed by the 043 aspiration catheter which was engaged in the occluded left posterior cerebral artery P2 segment. The 062 aspiration catheter was advanced to the proximal P2 segment. Aspiration was then applied at the hub of the 062 aspiration catheter, and the three distal aspiration catheters with a 20 mL syringe for approximately a minute and a half. The two aspiration catheters were then retrieved and removed. A control arteriogram performed through the sheath demonstrated complete revascularization of the right posterior cerebral artery achieving a TICI 3 revascularization. The Neuron Max sheath was then retrieved to its origin proximally. A control arteriogram performed through this demonstrated patency of the left vertebral artery extra cranially and intracranially with patency of the posterior cerebral arteries, the superior cerebellar arteries and the anterior-inferior cerebellar arteries. The Neuron Max sheath was removed. An 8 French Angio-Seal closure device was then deployed at the right groin puncture site. Distal pulses remained Dopplerable in both feet unchanged. A flat panel CT of the brain demonstrated no evidence of hemorrhagic complications. He was extubated. Upon recovery, the patient responded to name. Otherwise, remains unresponsive with no movement of the left arm and leg. Patient was transferred to the neuro ICU for post revascularization  care. IMPRESSION: Status post revascularization of occluded distal right posterior cerebral artery P2 segment with 1 pass with 043 contact aspiration achieving a TICI 3 revascularization. PLAN: As per referring MD. Electronically Signed   By: Thyra Nash M.D.   On: 02/17/2023 08:03   IR CT Head Ltd Result Date: 02/17/2023 INDICATION: Probable recent left-sided weakness with numbness.  Dysarthria. Occluded right posterior cerebral artery P2 segment on CT angiogram of the head and neck. EXAM: 1. EMERGENT LARGE VESSEL OCCLUSION THROMBOLYSIS (POSTERIOR CIRCULATION) COMPARISON:  CT angiogram of the head and neck 02/14/2023. MEDICATIONS: No antibiotic was administered within 1 hour of the procedure. ANESTHESIA/SEDATION: General anesthesia. CONTRAST:  Omnipaque  300 approximately 35 mL. FLUOROSCOPY TIME:  Fluoroscopy Time: 26 minutes 18 seconds (1112 mGy). COMPLICATIONS: None immediate. TECHNIQUE: Following a full explanation of the procedure along with the potential associated complications, an informed witnessed consent was obtained. The risks of intracranial hemorrhage of 10%, worsening neurological deficit, ventilator dependency, death and inability to revascularize were all reviewed in detail with the patient's spouse. The patient was then put under general anesthesia by the Department of Anesthesiology at [facility]. The right groin was prepped and draped in the usual sterile fashion. Thereafter using modified Seldinger technique, transfemoral access into the right common femoral  artery was obtained without difficulty. Over an 0.035 inch guidewire an 8 French 25 cm Pinnacle sheath was inserted. Through this, and also over an 0.035 inch guidewire a combination of a 125 cm Berenstein support catheter inside of an 088 90 cm Neuron Max sheath was advanced to the left subclavian artery. The support catheter, and the wire were removed. An arteriogram was then performed through the Neuron Max sheath in the  proximal left subclavian artery. FINDINGS: The left vertebral artery origin is widely patent with moderate tortuosity just distal to this. More distally, there is approximately 50% stenosis of the mid cervical left vertebral artery. Distal to this the left vertebrobasilar junction, the left posterior-inferior cerebellar artery and the tortuous basilar artery demonstrate wide patency. The left posterior cerebral artery, the superior cerebellar arteries and the anterior-inferior cerebellar arteries opacify into the capillary and venous phases. The right posterior cerebral artery demonstrates occlusion of the distal P1 segment. Retrograde opacification of the right vertebrobasilar junction to the level of the right posterior-inferior cerebellar artery is noted. PROCEDURE: Through the Neuron Max sheath in the distal left vertebral artery, a combination of an 062 132 cm aspiration catheter with an inner 160 cm 043 distal aspiration catheter was advanced over an 018 inch micro guidewire with moderate J configuration to the distal basilar artery. The micro guidewire was then advanced through the occluded right posterior cerebral artery distal P2 segment followed by the 043 aspiration catheter which was engaged in the occluded left posterior cerebral artery P2 segment. The 062 aspiration catheter was advanced to the proximal P2 segment. Aspiration was then applied at the hub of the 062 aspiration catheter, and the three distal aspiration catheters with a 20 mL syringe for approximately a minute and a half. The two aspiration catheters were then retrieved and removed. A control arteriogram performed through the sheath demonstrated complete revascularization of the right posterior cerebral artery achieving a TICI 3 revascularization. The Neuron Max sheath was then retrieved to its origin proximally. A control arteriogram performed through this demonstrated patency of the left vertebral artery extra cranially and  intracranially with patency of the posterior cerebral arteries, the superior cerebellar arteries and the anterior-inferior cerebellar arteries. The Neuron Max sheath was removed. An 8 French Angio-Seal closure device was then deployed at the right groin puncture site. Distal pulses remained Dopplerable in both feet unchanged. A flat panel CT of the brain demonstrated no evidence of hemorrhagic complications. He was extubated. Upon recovery, the patient responded to name. Otherwise, remains unresponsive with no movement of the left arm and leg. Patient was transferred to the neuro ICU for post revascularization care. IMPRESSION: Status post revascularization of occluded distal right posterior cerebral artery P2 segment with 1 pass with 043 contact aspiration achieving a TICI 3 revascularization. PLAN: As per referring MD. Electronically Signed   By: Thyra Nash M.D.   On: 02/17/2023 08:03   CT HEAD WO CONTRAST ( ) Result Date: 02/16/2023 CLINICAL DATA:  Stroke follow-up EXAM: CT HEAD WITHOUT CONTRAST TECHNIQUE: Contiguous axial images were obtained from the base of the skull through the vertex without intravenous contrast. RADIATION DOSE REDUCTION: This exam was performed according to the departmental dose-optimization program which includes automated exposure control, adjustment of the mA and/or kV according to patient size and/or use of iterative reconstruction technique. COMPARISON:  None Available. FINDINGS: Brain: Involving cytotoxic edema in the right thalamus and right occipital lobe at the site of known infarcts. 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 No  acute hemorrhage. No midline shift or significant mass effect. Vascular: No hyperdense vessel or unexpected calcification. Skull: Normal. Negative for fracture or focal lesion. Sinuses/Orbits: No acute finding. Other: None. IMPRESSION: 1. Involving cytotoxic edema in the right thalamus and right occipital lobe at the  site of known infarcts. No acute hemorrhage. 2. Unchanged large left anterior para falcine calcified meningioma. Electronically Signed   By: Franky Stanford M.D.   On: 02/16/2023 01:23   ECHOCARDIOGRAM COMPLETE Result Date: 02/15/2023    ECHOCARDIOGRAM REPORT   Patient Name:   Edward Rodgers Date of Exam: 02/15/2023 Medical Rec #:  [id number]      Height:       69.0 in Accession #:    [id number]     Weight:       223.1 lb Date of Birth:  03/03/1950      BSA:          2.164 m Patient Age:    72 years       BP:           120/71 mmHg Patient Gender: M              HR:           81 bpm. Exam Location:  Inpatient Procedure: 2D Echo, Cardiac Doppler, Color Doppler and Intracardiac            Opacification Agent Indications:    Stroke  History:        Patient has prior history of Echocardiogram examinations, most                 recent 03/30/2013. CHF; Stroke.  Sonographer:    Amy Chionchio Referring Phys: KHALIQDINA, SALMAN IMPRESSIONS  1. No evidence of LV thrombus. Definity  is administered. Left ventricular ejection fraction, by estimation, is 55%. The left ventricle has normal function. The left ventricle has no regional wall motion abnormalities. Left ventricular diastolic parameters are consistent with Grade I diastolic dysfunction (impaired relaxation).  2. Right ventricular systolic function was not well visualized. The right ventricular size is not well visualized. Tricuspid regurgitation signal is inadequate for assessing PA pressure.  3. The mitral valve is normal in structure. No evidence of mitral valve regurgitation. No evidence of mitral stenosis.  4. The aortic valve was not well visualized. Aortic valve regurgitation is not visualized. No aortic stenosis is present.  5. Aortic dilatation noted. There is borderline dilatation of the aortic root, measuring 36 mm. There is mild dilatation of the ascending aorta, measuring 40 mm. Comparison(s): No prior Echocardiogram. FINDINGS  Left Ventricle: No evidence of LV  thrombus. Definity  is administered. Left ventricular ejection fraction, by estimation, is 55%. The left ventricle has normal function. The left ventricle has no regional wall motion abnormalities. The left ventricular internal cavity size was normal in size. There is no left ventricular hypertrophy. Left ventricular diastolic parameters are consistent with Grade I diastolic dysfunction (impaired relaxation). Normal left ventricular filling pressure. Right Ventricle: The right ventricular size is not well visualized. Right vetricular wall thickness was not well visualized. Right ventricular systolic function was not well visualized. Tricuspid regurgitation signal is inadequate for assessing PA pressure. Left Atrium: Left atrial size was normal in size. Right Atrium: Right atrial size was normal in size. Pericardium: There is no evidence of pericardial effusion. Mitral Valve: The mitral valve is normal in structure. Mild mitral annular calcification. No evidence of mitral valve regurgitation. No evidence of mitral valve stenosis. MV peak gradient, 3.1  mmHg. The mean mitral valve gradient is 1.0 mmHg. Tricuspid Valve: The tricuspid valve is not well visualized. Tricuspid valve regurgitation is not demonstrated. No evidence of tricuspid stenosis. Aortic Valve: The aortic valve was not well visualized. Aortic valve regurgitation is not visualized. No aortic stenosis is present. Aortic valve mean gradient measures 5.0 mmHg. Aortic valve peak gradient measures 8.5 mmHg. Aortic valve area, by VTI measures 1.87 cm. Pulmonic Valve: The pulmonic valve was not well visualized. Pulmonic valve regurgitation is not visualized. No evidence of pulmonic stenosis. Aorta: Aortic dilatation noted. There is borderline dilatation of the aortic root, measuring 36 mm. There is mild dilatation of the ascending aorta, measuring 40 mm. Venous: The inferior vena cava was not well visualized. IAS/Shunts: No atrial level shunt detected by color  flow Doppler.  LEFT VENTRICLE PLAX 2D LVIDd:         5.10 cm      Diastology LVIDs:         4.10 cm      LV e' medial:    5.33 cm/s LV PW:         1.00 cm      LV E/e' medial:  6.7 LV IVS:        1.00 cm      LV e' lateral:   4.68 cm/s LVOT diam:     2.10 cm      LV E/e' lateral: 7.6 LV SV:         46 LV SV Index:   21 LVOT Area:     3.46 cm  LV Volumes (MOD) LV vol d, MOD A4C: 198.0 ml LV vol s, MOD A4C: 98.5 ml LV SV MOD A4C:     198.0 ml RIGHT VENTRICLE TAPSE (M-mode): 2.1 cm LEFT ATRIUM           Index        RIGHT ATRIUM           Index LA Vol (A4C): 56.0 ml 25.87 ml/m  RA Area:     11.10 cm                                    RA Volume:   20.60 ml  9.52 ml/m  AORTIC VALVE AV Area (Vmax):    1.99 cm AV Area (Vmean):   1.86 cm AV Area (VTI):     1.87 cm AV Vmax:           146.00 cm/s AV Vmean:          109.000 cm/s AV VTI:            0.244 m AV Peak Grad:      8.5 mmHg AV Mean Grad:      5.0 mmHg LVOT Vmax:         83.80 cm/s LVOT Vmean:        58.400 cm/s LVOT VTI:          0.132 m LVOT/AV VTI ratio: 0.54  AORTA Ao Root diam: 3.60 cm Ao Asc diam:  4.00 cm MITRAL VALVE MV Area (PHT): 5.66 cm    SHUNTS MV Area VTI:   3.22 cm    Systemic VTI:  0.13 m MV Peak grad:  3.1 mmHg    Systemic Diam: 2.10 cm MV Mean grad:  1.0 mmHg MV Vmax:       0.88 m/s MV Vmean:  48.6 cm/s MV Decel Time: 134 msec MV E velocity: 35.70 cm/s MV A velocity: 76.10 cm/s MV E/A ratio:  0.47 Vishnu Priya Mallipeddi Electronically signed by Diannah Late Mallipeddi Signature Date/Time: 02/15/2023/2:14:49 PM    Final    MR BRAIN WO CONTRAST Result Date: 02/15/2023 CLINICAL DATA:  Stroke follow-up. Status post revascularization of right P2 occlusion. EXAM: MRI HEAD WITHOUT CONTRAST TECHNIQUE: Multiplanar, multiecho pulse sequences of the brain and surrounding structures were obtained without intravenous contrast. COMPARISON:  Head CT and MRI 02/14/2023 FINDINGS: The patient was unable to tolerate the complete examination which was  terminated prematurely. Axial and coronal diffusion, axial T2, axial FLAIR, and sagittal T1 sequences were obtained and are mildly to moderately motion degraded. Brain: Restricted diffusion is again seen within the mesial right temporal lobe and right thalamus, mildly progressed from the prior MRI and with new hypointense signal centrally within the right thalamic infarct being suspicious for a small amount of hemorrhage (with assessment limited on this incomplete examination which did not include susceptibility weighted or gradient echo imaging). Patchy restricted diffusion in the right periatrial white matter and in the region of the posterior body and tail of the caudate nucleus is largely new, and there is also a new 2 cm focus of restricted diffusion in the right occipital lobe. 2 new punctate acute infarcts are present in the left occipital lobe. Patchy T2 hyperintensities elsewhere in the cerebral white matter bilaterally are unchanged and nonspecific but compatible with mild chronic small vessel ischemic disease. 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 There is no midline shift or extra-axial fluid collection. There is mild generalized cerebral atrophy. Small chronic right cerebellar infarcts are again noted. Vascular: Major intracranial vascular flow voids are preserved. Skull and upper cervical spine: No suspicious marrow lesion. Advanced left facet arthrosis at C3-4 and C4-5. Sinuses/Orbits: Right cataract extraction. No evidence of significant inflammation in the paranasal sinuses. Clear mastoid air cells. Other: None. IMPRESSION: 1. Incomplete, motion degraded examination. 2. Progressive acute right PCA territory infarct with extension most notable in the right periatrial white matter and occipital lobe. Suspected subcentimeter hemorrhage centrally in the right thalamic infarct. 3. New punctate acute infarcts in the left occipital lobe. 4. Mild chronic small vessel ischemic disease. 5.  Known left anterior parafalcine meningioma. Electronically Signed   By: Dasie Hamburg M.D.   On: 02/15/2023 10:35   CT HEAD WO CONTRAST ( ) Result Date: 02/14/2023 CLINICAL DATA:  Stroke follow-up EXAM: CT HEAD WITHOUT CONTRAST TECHNIQUE: Contiguous axial images were obtained from the base of the skull through the vertex without intravenous contrast. RADIATION DOSE REDUCTION: This exam was performed according to the departmental dose-optimization program which includes automated exposure control, adjustment of the mA and/or kV according to patient size and/or use of iterative reconstruction technique. COMPARISON:  Brain MRI from earlier today FINDINGS: Brain: There is likely gray-white differentiation loss asymmetrically in the medial right temporal lobe, site of restricted diffusion previously. The right thalamic MR signal abnormality has also become apparent. No hemorrhage, hydrocephalus, or shift. Chronic small vessel ischemia in the cerebral white matter and known partially calcified left anterior parafalcine meningioma. Vascular: Diffusely high-density vessels in the setting of recent contrast administration. Reference prior CTA. Skull: Normal. Negative for fracture or focal lesion. Sinuses/Orbits: No acute finding. IMPRESSION: Subtle but likely cytotoxic edema now seen at areas of right temporal and thalamic diffusion abnormality. No intracranial hemorrhage. Electronically Signed   By: Dorn Roulette M.D.   On: 02/14/2023 09:15  CT CEREBRAL PERFUSION W CONTRAST Result Date: 02/14/2023 CLINICAL DATA:  Stroke workup EXAM: CT PERFUSION BRAIN TECHNIQUE: Multiphase CT imaging of the brain was performed following IV bolus contrast injection. Subsequent parametric perfusion maps were calculated using RAPID software. RADIATION DOSE REDUCTION: This exam was performed according to the departmental dose-optimization program which includes automated exposure control, adjustment of the mA and/or kV according to  patient size and/or use of iterative reconstruction technique. CONTRAST:  40mL OMNIPAQUE  IOHEXOL  350 MG/ML SOLN COMPARISON:  CT perfusion from earlier the same day FINDINGS: CT Brain Perfusion Findings: CBF (<30%) Volume: 0mL Perfusion (Tmax>6.0s) volume: 36mL in the right PCA distribution primarily seen at the occipital lobe. IMPRESSION: Increased ischemic range perfusion in the right PCA distribution measuring 36 cc as compared to 25 cc earlier today. Electronically Signed   By: Dorn Roulette M.D.   On: 02/14/2023 09:08   MR BRAIN WO CONTRAST Result Date: 02/14/2023 CLINICAL DATA:  Wake up stroke EXAM: MRI HEAD WITHOUT CONTRAST TECHNIQUE: Multiplanar, multiecho pulse sequences of the brain and surrounding structures were obtained without intravenous contrast. COMPARISON:  Head CTA from earlier today FINDINGS: Brain: Restricted diffusion in the right thalamus, right periatrial white matter, and medial temporal lobe at the hippocampus without any FLAIR changes. Case discussed while in progress with neurology. No acute infarct seen in a different vascular distribution. Known anterior left parafalcine meningioma with signal of dense cellularity, reference measurements by CT given this noncontrast study. There is mass effect on the adjacent frontal lobe without edema. Mild chronic small vessel ischemia in the cerebral white matter. Small chronic right cerebellar infarct. No acute hemorrhage, hydrocephalus, or collection. Vascular: No detected change from prior. Skull and upper cervical spine: Normal marrow signal. Sinuses/Orbits: Unremarkable IMPRESSION: Diffusion hyperintensity in the right PCA distribution affecting the medial temporal lobe and thalamus. FLAIR hyperintensity is not yet detected. Chronic small vessel ischemia and left anterior parafalcine meningioma. Electronically Signed   By: Dorn Roulette M.D.   On: 02/14/2023 05:43   CT ANGIO HEAD NECK W WO CM W PERF (CODE STROKE) Result Date:  02/14/2023 CLINICAL DATA:  Neuro deficit with acute stroke suspected. EXAM: CT ANGIOGRAPHY HEAD AND NECK CT PERFUSION BRAIN TECHNIQUE: Multidetector CT imaging of the head and neck was performed using the standard protocol during bolus administration of intravenous contrast. Multiplanar CT image reconstructions and MIPs were obtained to evaluate the vascular anatomy. Carotid stenosis measurements (when applicable) are obtained utilizing NASCET criteria, using the distal internal carotid diameter as the denominator. Multiphase CT imaging of the brain was performed following IV bolus contrast injection. Subsequent parametric perfusion maps were calculated using RAPID software. RADIATION DOSE REDUCTION: This exam was performed according to the departmental dose-optimization program which includes automated exposure control, adjustment of the mA and/or kV according to patient size and/or use of iterative reconstruction technique. CONTRAST:  Dose is not known on this in progress study. COMPARISON:  Head CT from earlier today FINDINGS: Aortic arch: Atheromatous plaque with 3 vessel branching. Right carotid system: Calcified plaque in the proximal ICA without significant stenosis, ulceration, or beading. Left carotid system: Mild atheromatous plaque for age mainly at the proximal ICA without stenosis or ulceration. Vertebral arteries: Proximal subclavian arteries. Calcified plaque at both vertebral ostia. No flow reducing stenosis, ulceration, or dissection. Skeleton: Generalized cervical spine degeneration with mild scoliosis. Other neck: No acute finding Upper chest: Emphysema and airway thickening. No acute finding. Review of the MIP images confirms the above findings CTA HEAD FINDINGS Limited by venous  contamination. Anterior circulation: No emergent branch occlusion, segmental narrowing, aneurysm, or vascular malformation. There is diffuse atheromatous irregularity affecting the MCA and ACA distributions without  correctable and proximal flow reducing stenosis. Posterior circulation: The vertebral and basilar arteries are atheromatous and tortuous without occlusion or flow reducing stenosis. Abrupt cut off at the right P2 segment with some downstream reconstitution. Venous sinuses: Unremarkable Anatomic variants: None significant CT Brain Perfusion Findings: ASPECTS: 10 CBF (<30%) Volume: 0mL Perfusion (Tmax>6.0s) volume: 70mL-in the right PCA distribution Findings discussed with neurology before signing. IMPRESSION: 1. Right P2 occlusion, presumably acute in this setting. There is 25 cc of relative ischemia by CT perfusion in this distribution. 2. Extensive intracranial atherosclerosis. 3. Atherosclerosis in the neck without flow reducing stenosis or suspicious embolic source. Electronically Signed   By: Dorn Roulette M.D.   On: 02/14/2023 04:43   CT HEAD CODE STROKE WO CONTRAST Result Date: 02/14/2023 CLINICAL DATA:  Code stroke. EXAM: CT HEAD WITHOUT CONTRAST TECHNIQUE: Contiguous axial images were obtained from the base of the skull through the vertex without intravenous contrast. RADIATION DOSE REDUCTION: This exam was performed according to the departmental dose-optimization program which includes automated exposure control, adjustment of the mA and/or kV according to patient size and/or use of iterative reconstruction technique. COMPARISON:  03/29/2013 FINDINGS: Brain: No evidence of acute infarction, hemorrhage, hydrocephalus, extra-axial collection. Partially calcified anterior left parafalcine mass consistent with meningioma, 3 cm in maximal size with mass effect on the local frontal lobe. No evidence of adjacent brain edema. Vascular: No hyperdense vessel or unexpected calcification. Skull: Normal. Negative for fracture or focal lesion. Sinuses/Orbits: No acute finding. Other: Prelim sent in epic chat. ASPECTS [facility] Stroke Program Early CT Score) Ten on both sides. IMPRESSION: 1. No acute finding. 2. 3 cm  anterior left parafalcine meningioma that has enlarged from 2015. Electronically Signed   By: Dorn Roulette M.D.   On: 02/14/2023 04:19    HISTORY OF PRESENT ILLNESS 73 y.o. patient with history of gout, essential hypertension, hypothyroidism, prior cerebellar stroke, and hyperlipidemia was admitted with left-sided weakness, left-sided numbness, and disconjugate gaze with a slight right gaze preference and inconsistent responses in the left hemivisual field.  Initial neuroimaging revealed right PCA P2 occlusion with 25 cc penumbra in the right occipital lobe s/p TNKase  without initial thrombectomy due to improvement in symptoms prior to IR procedure NIH improved from 11 to 3).  Due to neurologic exam worsening on 2/1, repeat imaging was obtained revealing an increase in penumbra to 36 cc with NIH increase to 7 and patient's wife consented for IR thrombectomy at that time.  HOSPITAL COURSE Stroke:  Right PCA scattered infarct with thalamic HT and punctate left PCA infarcts with R P2 occlusion s/p TNK and IR are with TICI3 repeat fusion, etiology: Concerning for cardioembolic source Code Stroke CT head - No acute abnormality.  3 cm anterior left parafalcine meningioma, enlarged from 2015. CTA head & neck w/Perfusion Right P2 occlusion. Extensive intracranial atherosclerosis at bilateral ICA bulbs and siphons. CTP 0/25cc  MRI Diffusion hyperintensity in the right PCA distribution affecting the medial temporal lobe and thalamus. FLAIR hyperintensity is not yet detected. left anterior parafalcine meningioma. NIHSS 11->3->7 Repeat CTH Subtle but likely cytotoxic edema now seen at areas of right temporal and thalamic diffusion abnormality Repeat CT Perfusion 0/36 S/p IR with right distal P2 occlusion and TICI3 reperfusion MRI Progressive acute right PCA territory infarct with extension most notable in the right periatrial white matter and occipital lobe. Suspected  subcentimeter hemorrhage centrally in the  right thalamic infarct. New punctate acute infarcts in the left occipital lobe. 2/3 CT repeat  Involving cytotoxic edema in the right thalamus and right occipital lobe at the site of known infarcts. No acute hemorrhage. Unchanged large left anterior para falcine calcified meningioma. 2D Echo EF 55%, no LV thrombus LE venous Doppler no DVT Loop recorder placed 02/20/23 LDL 30 HgbA1c 6.4 UDS negative VTE prophylaxis - Lovenox  aspirin  325 mg daily prior to admission, now on ASA 81 and plavix  DAPT for 3 weeks and then plavix  alone. Therapy recommendations: CIR Disposition: Discharge to CIR   History of stroke 03/2013 right PICA small infarct, EF 35 to 40%, LDL 112.  Discharged on aspirin  81 and statin.   History of CHF 03/2013 2D echo EF 35 to 40% Home medication including metoprolol , spironolactone  2D echo this admission EF 55% Resumed home metoprolol  and spironolactone  Loop recorder placed 02/20/23   Severe cervical stenosis Pt wife showed me pt MRI [facility]-spine exam report at [facility] It showed severe cervical stenosis with early sign of myelopathy MD discussed with wife that pt is not candidate for cervical surgery at this time After CIR, pt can follow up with neurosurgery in [place] or if pt want, we can refer him to our neurosurgery.    Hypertension Home meds: Spironolactone  25, toprol -xl 100mg  Cleviprex  gtt off Resumed home metoprolol  and spironolactone  Add amlodipine  10 BP goal less than 160 given small HT Monitor BP goal normotensive   Hyperlipidemia Home meds: Lipitor  80 LDL 30, goal < 70 Resume home Lipitor  80 Consider statin on discharge   AKI, resolved creatinine 1.20--1.30--0.96 --1.44--1.32-> 1.21-> 1.23 Encourage p.o. intake   Sundowning Delirium  Likely mild baseline cognitive impairment Resume home Effexor  Continue seroquel  50mg  HS Improved   Other Stroke Risk Factors Obesity, Body mass index is 32.95 kg/m., BMI >/= 30 associated with increased stroke risk, recommend  weight loss, diet and exercise as appropriate  Former smoker   Other acute issues Gout, resume allopurinol  Stable left frontal meningioma  RN Pressure Injury Documentation: Pressure Injury 02/16/23 Buttocks Right Stage 2 -  Partial thickness loss of dermis presenting as a shallow open injury with a red, pink wound bed without slough. pink, red (Active)  02/16/23 1420  Location: Buttocks  Location Orientation: Right  Staging: Stage 2 -  Partial thickness loss of dermis presenting as a shallow open injury with a red, pink wound bed without slough.  Wound Description (Comments): pink, red  Present on Admission:   Dressing Type Foam - Lift dressing to assess site every shift 02/20/23 2045   DISCHARGE EXAM  PHYSICAL EXAM General:  Alert, well-nourished, well-developed patient in no acute distress Psych:  Mood and affect appropriate for situation CV: Regular rate and rhythm on monitor Respiratory:  Regular, unlabored respirations on room air GI: Abdomen soft and nontender  NEURO:  Mental Status: AA&Ox3  Speech/Language: speech has a nasal quality without dysarthria, no aphasia noted.  Naming, repetition, fluency, and comprehension intact.  Cranial Nerves:  II: PERRL.  III, IV, VI: EOMI. Eyelids elevate symmetrically.  V: Sensation is intact to light touch and symmetrical to face.  VII: Face is symmetrical resting and with movement VIII: Hearing is intact to voice. IX, X: Phonation intact KP:Dynloizm shrug 5/5. XII: Tongue protrudes midline Motor:  Left hemiplegia is persistent.  LUE: Only able to move the fingers in the left hand with minimal movement without gravity.  Does not lift left upper extremity off the bed.  LLE: 3/5 with some antigravity movement. RUE/RLE: 5/5, no drift.  Tone: is normal and bulk is normal Sensation: Reports equal sensation bilaterally in the upper and lower extremities. Coordination: No ataxia on the right, does not perform on the left Gait:  deferred for patient safety  Discharge Diet       Diet   Diet regular Room service appropriate? Yes; Fluid consistency: Thin   liquids  DISCHARGE PLAN Disposition: Rehab aspirin  81 mg daily and clopidogrel  75 mg daily for secondary stroke prevention for 3 weeks then clopidogrel  75 mg daily alone. Ongoing stroke risk factor control by Primary Care Physician at time of discharge Follow-up PCP Pcp, No in 2 weeks. [facility] in 8 weeks, office to schedule an appointment.   35 minutes were spent preparing discharge.  Stevi Toberman, AGACNP-BC [facility]  ATTENDING ATTESTATION:  Dr. Nichola evaluated pt independently, reviewed imaging, chart, labs. Discussed and formulated plan with the Resident/APP. Changes were made to the note where appropriate. Please see APP/resident note above for details.   Total 36 minutes spent on counseling patient and coordinating care, writing notes and reviewing chart.    Uyen Eichholz,MD

## 2023-02-21 NOTE — Plan of Care (Signed)
 Problem: Education: Goal: Knowledge of disease or condition will improve Outcome: Adequate for Discharge Goal: Knowledge of secondary prevention will improve (MUST DOCUMENT ALL) Outcome: Adequate for Discharge Goal: Knowledge of patient specific risk factors will improve (DELETE if not current risk factor) Outcome: Adequate for Discharge   Problem: Ischemic Stroke/TIA Tissue Perfusion: Goal: Complications of ischemic stroke/TIA will be minimized Outcome: Adequate for Discharge   Problem: Coping: Goal: Will verbalize positive feelings about self Outcome: Adequate for Discharge Goal: Will identify appropriate support needs Outcome: Adequate for Discharge   Problem: Health Behavior/Discharge Planning: Goal: Ability to manage health-related needs will improve Outcome: Adequate for Discharge Goal: Goals will be collaboratively established with patient/family Outcome: Adequate for Discharge   Problem: Self-Care: Goal: Ability to participate in self-care as condition permits will improve Outcome: Adequate for Discharge Goal: Verbalization of feelings and concerns over difficulty with self-care will improve Outcome: Adequate for Discharge Goal: Ability to communicate needs accurately will improve Outcome: Adequate for Discharge   Problem: Nutrition: Goal: Risk of aspiration will decrease Outcome: Adequate for Discharge Goal: Dietary intake will improve Outcome: Adequate for Discharge   Problem: Education: Goal: Knowledge of General Education information will improve Description: Including pain rating scale, medication(s)/side effects and non-pharmacologic comfort measures Outcome: Adequate for Discharge   Problem: Health Behavior/Discharge Planning: Goal: Ability to manage health-related needs will improve Outcome: Adequate for Discharge   Problem: Clinical Measurements: Goal: Ability to maintain clinical measurements within normal limits will improve Outcome: Adequate for  Discharge Goal: Will remain free from infection Outcome: Adequate for Discharge Goal: Diagnostic test results will improve Outcome: Adequate for Discharge Goal: Respiratory complications will improve Outcome: Adequate for Discharge Goal: Cardiovascular complication will be avoided Outcome: Adequate for Discharge   Problem: Activity: Goal: Risk for activity intolerance will decrease Outcome: Adequate for Discharge   Problem: Nutrition: Goal: Adequate nutrition will be maintained Outcome: Adequate for Discharge   Problem: Coping: Goal: Level of anxiety will decrease Outcome: Adequate for Discharge   Problem: Elimination: Goal: Will not experience complications related to bowel motility Outcome: Adequate for Discharge Goal: Will not experience complications related to urinary retention Outcome: Adequate for Discharge   Problem: Pain Managment: Goal: General experience of comfort will improve and/or be controlled Outcome: Adequate for Discharge   Problem: Safety: Goal: Ability to remain free from injury will improve Outcome: Adequate for Discharge   Problem: Skin Integrity: Goal: Risk for impaired skin integrity will decrease Outcome: Adequate for Discharge   Problem: Education: Goal: Understanding of CV disease, CV risk reduction, and recovery process will improve Outcome: Adequate for Discharge Goal: Individualized Educational Video(s) Outcome: Adequate for Discharge   Problem: Activity: Goal: Ability to return to baseline activity level will improve Outcome: Adequate for Discharge   Problem: Cardiovascular: Goal: Ability to achieve and maintain adequate cardiovascular perfusion will improve Outcome: Adequate for Discharge Goal: Vascular access site(s) Level 0-1 will be maintained Outcome: Adequate for Discharge   Problem: Health Behavior/Discharge Planning: Goal: Ability to safely manage health-related needs after discharge will improve Outcome: Adequate for  Discharge   Problem: SLP Cognition Goals Goal: Patient will demonstrate attention to functional Description: Patient will demonstrate attention to functional task with Outcome: Adequate for Discharge Goal: Patient will utilize external memory aids Description: Patient will utilize external memory aids to facilitate recall of information for improved safety with Outcome: Adequate for Discharge Goal: Patient will demonstrate problem solving skills Description: Patient will demonstrate problem solving skills during functional ADL's with Outcome: Adequate for Discharge  Problem: Acute Rehab PT Goals(only PT should resolve) Goal: Pt will Roll Supine to Side Outcome: Adequate for Discharge Goal: Pt Will Go Supine/Side To Sit Outcome: Adequate for Discharge Goal: Patient Will Perform Sitting Balance Outcome: Adequate for Discharge Goal: Pt Will Transfer Bed To Chair/Chair To Bed Outcome: Adequate for Discharge Goal: Pt Will Perform Standing Balance Or Pre-Gait Outcome: Adequate for Discharge Goal: Pt Will Ambulate Outcome: Adequate for Discharge   Problem: Acute Rehab OT Goals (only OT should resolve) Goal: Pt. Will Perform Eating Outcome: Adequate for Discharge Goal: Pt. Will Perform Grooming Outcome: Adequate for Discharge Goal: Pt. Will Perform Upper Body Dressing Outcome: Adequate for Discharge Goal: Pt. Will Transfer To Toilet Outcome: Adequate for Discharge Goal: OT Additional ADL Goal #1 Outcome: Adequate for Discharge   Problem: Education: Goal: Knowledge of disease or condition will improve Outcome: Adequate for Discharge Goal: Knowledge of secondary prevention will improve (MUST DOCUMENT ALL) Outcome: Adequate for Discharge Goal: Knowledge of patient specific risk factors will improve (DELETE if not current risk factor) Outcome: Adequate for Discharge

## 2023-02-22 DIAGNOSIS — I63531 Cerebral infarction due to unspecified occlusion or stenosis of right posterior cerebral artery: Secondary | ICD-10-CM | POA: Diagnosis not present

## 2023-02-22 DIAGNOSIS — K5901 Slow transit constipation: Secondary | ICD-10-CM | POA: Diagnosis not present

## 2023-02-22 DIAGNOSIS — I10 Essential (primary) hypertension: Secondary | ICD-10-CM | POA: Diagnosis not present

## 2023-02-22 MED ORDER — VENLAFAXINE HCL ER 150 MG PO CP24
150.0000 mg | ORAL_CAPSULE | Freq: Every evening | ORAL | Status: DC
  Administered 2023-02-22 – 2023-03-27 (×34): 150 mg via ORAL
  Filled 2023-02-22 (×34): qty 1

## 2023-02-22 NOTE — Progress Notes (Signed)
 PROGRESS NOTE   Subjective/Complaints:  Pt doing well this morning, slept well, denies pain, LBM 2d ago, urinating fine. Denies any other complaints or concerns today.   ROS: as per HPI. Denies CP, SOB, abd pain, N/V/D, or any other complaints at this time.    Objective:   No results found. Recent Labs    02/21/23 1534  WBC 6.7  HGB 13.0  HCT 40.6  PLT 207   Recent Labs    02/21/23 1534  CREATININE 1.22       No intake or output data in the 24 hours ending 02/22/23 0821   Pressure Injury 02/16/23 Buttocks Right Stage 2 -  Partial thickness loss of dermis presenting as a shallow open injury with a red, pink wound bed without slough. pink, red (Active)  02/16/23 1420  Location: Buttocks  Location Orientation: Right  Staging: Stage 2 -  Partial thickness loss of dermis presenting as a shallow open injury with a red, pink wound bed without slough.  Wound Description (Comments): pink, red  Present on Admission:     Physical Exam: Vital Signs Blood pressure (!) 144/92, pulse 80, temperature 98.3 F (36.8 C), temperature source Oral, resp. rate 18, height 5' 9 (1.753 m), weight 91.6 kg, SpO2 96%.  Constitutional:  resting comfortably in bed    General: He is not in acute distress.    Appearance: He is not ill-appearing.  HENT:     Head: Normocephalic and atraumatic.     Right Ear: External ear normal.     Left Ear: External ear normal.     Nose: Nose normal.     Mouth/Throat:     Mouth: Mucous membranes are moist.  Eyes:     Extraocular Movements: Extraocular movements intact.     Conjunctiva/sclera: Conjunctivae normal.     Pupils: Pupils are equal, round, and reactive to light.  Cardiovascular:     Rate and Rhythm: Normal rate and regular rhythm.     Heart sounds: Murmur heard.  Pulmonary:     Effort: Pulmonary effort is normal. No respiratory distress.     Breath sounds: No stridor. No wheezing or  rhonchi.  Abdominal:     General: Bowel sounds are normal. There is no distension.     Palpations: Abdomen is soft.     Tenderness: There is no abdominal tenderness.  Musculoskeletal:        General: Swelling (trace LUE) present.     Cervical back: Normal range of motion.      No pedal edema appreciated Skin:    General: Skin is warm and dry.     Findings: No bruising or erythema.  Psychiatric:     Comments: Pt pleasant and cooperative   PRIOR EXAMS: Neurological:     Mental Status: He is alert.     Comments: Pt is alert. Oriented to person, place, month/year. Knew why he was here. Told me who he was rooting for in [employer] tomorrow. Was a little drowsy, nodded off a few times during my visit. Speech dysarthric. Language appears intact. Fair insight and awareness. Left central 7 and tongue deviation. LUE 1/5 prox to distal. LLLE 1 to  1+/5 prox to distal. RUE 4/5, RLE 4- to 4/5. Decreased LT and pain in left leg and arm as wel. DTR's 2+ on left. Toes up. Heel cord a little tight, wearing PRAFO. No resting tone appreciated.    Assessment/Plan: 1. Functional deficits which require 3+ hours per day of interdisciplinary therapy in a comprehensive inpatient rehab setting. Physiatrist is providing close team supervision and 24 hour management of active medical problems listed below. Physiatrist and rehab team continue to assess barriers to discharge/monitor patient progress toward functional and medical goals  Care Tool:  Bathing              Bathing assist       Upper Body Dressing/Undressing Upper body dressing        Upper body assist      Lower Body Dressing/Undressing Lower body dressing            Lower body assist       Toileting Toileting    Toileting assist       Transfers Chair/bed transfer  Transfers assist           Locomotion Ambulation   Ambulation assist              Walk 10 feet activity   Assist           Walk 50  feet activity   Assist           Walk 150 feet activity   Assist           Walk 10 feet on uneven surface  activity   Assist           Wheelchair     Assist               Wheelchair 50 feet with 2 turns activity    Assist            Wheelchair 150 feet activity     Assist          Blood pressure (!) 144/92, pulse 80, temperature 98.3 F (36.8 C), temperature source Oral, resp. rate 18, height 5' 9 (1.753 m), weight 91.6 kg, SpO2 96%.  Medical Problem List and Plan: 1. 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  Await plan for loop recorder             -patient may shower -ELOS/Goals: 21-25 days, min assist with PT, OT and supervision with SLP -CIR evals today 2/9 2.  Antithrombotics: -DVT/anticoagulation:  Pharmaceutical: Heparin  5000U q8h -antiplatelet therapy: Aspirin  81 mg daily and Plavix  75 mg day x 3 weeks then Plavix  alone 3. Pain Management: Lidoderm  patch as directed, Tylenol  as needed 4. Mood/Sleep/sundowning: Effexor  150 mg every evening, provide emotional support             -antipsychotic agents: Seroquel  50 mg nighty at 2030, have back up dose             -keep sleep chart 5. Neuropsych/cognition: This patient is capable of making decisions on his own behalf. 6. Skin/Wound Care: Routine skin checks 7. Fluids/Electrolytes/Nutrition: Routine in and outs with follow-up chemistries, continue vitamins/supplements 8.  Hypothyroidism.  Synthroid  47mcg/day 9.  Hypertension. Norvasc  10 mg daily, Toprol -Xl 100 mg daily, Aldactone  25 mg daily.             -bp trending up. Likely will need additions to regimen  -02/22/23 BPs 140s/90s, not terrible, monitor  trend with increase mobility  Vitals:   02/21/23 1441 02/21/23 1924 02/22/23 0400  BP: (!) 142/93 (!) 145/94 (!) 144/92    10.H/O prostate cancer. Myrbetriq  50 mg daily. 11.Hyperlipidemia.  Lipitor  80mg  daily 12.Constipation.             -miralax  daily, SenokotS 1 tab daily -02/22/23 LBM 2days ago, monitor today, if no BM tomorrow then may want to increase regimen 13.H/O gout. Zyloprim  300mg  daily    LOS: 1 days A FACE TO FACE EVALUATION WAS PERFORMED  [address] 02/22/2023, 8:21 AM

## 2023-02-22 NOTE — Plan of Care (Signed)
  Problem: Consults Goal: RH STROKE PATIENT EDUCATION Description: See Patient Education module for education specifics  Outcome: Progressing   Problem: RH BOWEL ELIMINATION Goal: RH STG MANAGE BOWEL WITH ASSISTANCE Description: STG Manage Bowel with min Assistance. Outcome: Progressing Goal: RH STG MANAGE BOWEL W/MEDICATION W/ASSISTANCE Description: STG Manage Bowel with Medication with mod I  Assistance. Outcome: Progressing   Problem: RH BLADDER ELIMINATION Goal: RH STG MANAGE BLADDER WITH ASSISTANCE Description: STG Manage Bladder With min Assistance Outcome: Progressing Goal: RH STG MANAGE BLADDER WITH MEDICATION WITH ASSISTANCE Description: STG Manage Bladder With Medication With mod I Assistance. Outcome: Progressing   Problem: RH SKIN INTEGRITY Goal: RH STG SKIN FREE OF INFECTION/BREAKDOWN Description: Manage skin w min assist Outcome: Progressing Goal: RH STG MAINTAIN SKIN INTEGRITY WITH ASSISTANCE Description: STG Maintain Skin Integrity With min Assistance. Outcome: Progressing   Problem: RH SAFETY Goal: RH STG ADHERE TO SAFETY PRECAUTIONS W/ASSISTANCE/DEVICE Description: STG Adhere to Safety Precautions With cues  Assistance/Device. Outcome: Progressing   Problem: RH COGNITION-NURSING Goal: RH STG USES MEMORY AIDS/STRATEGIES W/ASSIST TO PROBLEM SOLVE Description: STG Uses Memory Aids/Strategies With cues Assistance to Problem Solve. Outcome: Progressing   Problem: RH KNOWLEDGE DEFICIT Goal: RH STG INCREASE KNOWLEDGE OF DIABETES Description: Patient and wife will be able to manage prediabetes using educational resources for medications and dietary modification independently Outcome: Progressing Goal: RH STG INCREASE KNOWLEDGE OF HYPERTENSION Description: Patient and wife will be able to manage HTN using educational resources for medications and dietary modification independently Outcome: Progressing Goal: RH STG INCREASE KNOWLEGDE OF  HYPERLIPIDEMIA Description: Patient and wife will be able to manage HLD using educational resources for medications and dietary modification independently Outcome: Progressing Goal: 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

## 2023-02-22 NOTE — Plan of Care (Signed)
 Problem: RH Balance Goal: LTG: Patient will maintain dynamic sitting balance (OT) Description: LTG:  Patient will maintain dynamic sitting balance with assistance during activities of daily living (OT) Flowsheets (Taken 02/22/2023 1301) LTG: Pt will maintain dynamic sitting balance during ADLs with: Independent with assistive device Goal: LTG Patient will maintain dynamic standing with ADLs (OT) Description: LTG:  Patient will maintain dynamic standing balance with assist during activities of daily living (OT)  Flowsheets (Taken 02/22/2023 1301) LTG: Pt will maintain dynamic standing balance during ADLs with: Minimal Assistance - Patient > 75%   Problem: Sit to Stand Goal: LTG:  Patient will perform sit to stand in prep for activites of daily living with assistance level (OT) Description: LTG:  Patient will perform sit to stand in prep for activites of daily living with assistance level (OT) Flowsheets (Taken 02/22/2023 1301) LTG: PT will perform sit to stand in prep for activites of daily living with assistance level: Minimal Assistance - Patient > 75%   Problem: RH Eating Goal: LTG Patient will perform eating w/assist, cues/equip (OT) Description: LTG: Patient will perform eating with assist, with/without cues using equipment (OT) Flowsheets (Taken 02/22/2023 1301) LTG: Pt will perform eating with assistance level of: Independent with assistive device    Problem: RH Grooming Goal: LTG Patient will perform grooming w/assist,cues/equip (OT) Description: LTG: Patient will perform grooming with assist, with/without cues using equipment (OT) Flowsheets (Taken 02/22/2023 1301) LTG: Pt will perform grooming with assistance level of: Independent with assistive device    Problem: RH Bathing Goal: LTG Patient will bathe all body parts with assist levels (OT) Description: LTG: Patient will bathe all body parts with assist levels (OT) Flowsheets (Taken 02/22/2023 1301) LTG: Pt will perform bathing with  assistance level/cueing: Minimal Assistance - Patient > 75%   Problem: RH Dressing Goal: LTG Patient will perform upper body dressing (OT) Description: LTG Patient will perform upper body dressing with assist, with/without cues (OT). Flowsheets (Taken 02/22/2023 1301) LTG: Pt will perform upper body dressing with assistance level of: Independent with assistive device Goal: LTG Patient will perform lower body dressing w/assist (OT) Description: LTG: Patient will perform lower body dressing with assist, with/without cues in positioning using equipment (OT) Flowsheets (Taken 02/22/2023 1301) LTG: Pt will perform lower body dressing with assistance level of: Minimal Assistance - Patient > 75%   Problem: RH Toileting Goal: LTG Patient will perform toileting task (3/3 steps) with assistance level (OT) Description: LTG: Patient will perform toileting task (3/3 steps) with assistance level (OT)  Flowsheets (Taken 02/22/2023 1301) LTG: Pt will perform toileting task (3/3 steps) with assistance level: Minimal Assistance - Patient > 75%   Problem: RH Functional Use of Upper Extremity Goal: LTG Patient will use RT/LT upper extremity as a (OT) Description: LTG: Patient will use right/left upper extremity as a stabilizer/gross assist/diminished/nondominant/dominant level with assist, with/without cues during functional activity (OT) Flowsheets (Taken 02/22/2023 1301) LTG: Use of upper extremity in functional activities: LUE as gross assist level LTG: Pt will use upper extremity in functional activity with assistance level of: Independent with assistive device   Problem: RH Toilet Transfers Goal: LTG Patient will perform toilet transfers w/assist (OT) Description: LTG: Patient will perform toilet transfers with assist, with/without cues using equipment (OT) Flowsheets (Taken 02/22/2023 1301) LTG: Pt will perform toilet transfers with assistance level of: Minimal Assistance - Patient > 75%   Problem: RH  Tub/Shower Transfers Goal: LTG Patient will perform tub/shower transfers w/assist (OT) Description: LTG: Patient will perform tub/shower transfers with  assist, with/without cues using equipment (OT) Flowsheets (Taken 02/22/2023 1301) LTG: Pt will perform tub/shower stall transfers with assistance level of: Minimal Assistance - Patient > 75%

## 2023-02-22 NOTE — Evaluation (Signed)
 Physical Therapy Assessment and Plan  Patient Details  Name: Edward Rodgers MRN: [id number] Date of Birth: 14-May-1950  PT Diagnosis: Abnormal posture, Abnormality of gait, Cognitive deficits, Difficulty walking, Dizziness and giddiness, Edema, Hemiparesis non-dominant, Hemiplegia non-dominant, Impaired cognition, Impaired sensation, and Muscle weakness Rehab Potential: Fair ELOS: 17-21 days   Today's Date: 02/22/2023 PT Individual Time: 8696-8576 PT Individual Time Calculation (min): 80 min    Hospital Problem: Principal Problem:   Acute right PCA stroke [facility])   Past Medical History:  Past Medical History:  Diagnosis Date   Cancer (HCC) 2002   prostate   CHF (congestive heart failure) (HCC)    Gout    Hypertension    Thyroid  disease    Past Surgical History:  Past Surgical History:  Procedure Laterality Date   COLONOSCOPY N/A 07/15/2017   Procedure: COLONOSCOPY;  Surgeon: Golda Claudis PENNER, MD;  Location: [facility];  Service: Endoscopy;  Laterality: N/A;  830   IR CT HEAD LTD  02/14/2023   IR PERCUTANEOUS ART THROMBECTOMY/INFUSION INTRACRANIAL INC DIAG ANGIO  02/14/2023   POLYPECTOMY  07/15/2017   Procedure: POLYPECTOMY;  Surgeon: Golda Claudis PENNER, MD;  Location: [facility];  Service: Endoscopy;;  colon   PROSTATE SURGERY     RADIOLOGY WITH ANESTHESIA Left 02/14/2023   Procedure: IR WITH ANESTHESIA;  Surgeon: Radiologist, Medication, MD;  Location: [facility] OR;  Service: Radiology;  Laterality: Left;   RADIOLOGY WITH ANESTHESIA N/A 02/14/2023   Procedure: IR WITH ANESTHESIA;  Surgeon: Radiologist, Medication, MD;  Location: [facility] OR;  Service: Radiology;  Laterality: N/A;    Assessment & Plan Clinical Impression: Patient is a 73 y.o. year old male with history of diastolic congestive heart failure, gout, hypothyroidism, prior cerebellar infarct with residual diplopia maintained on full-strength aspirin , prostate cancer, tobacco use.  Per chart review patient lives with spouse.  Two-level  home bed and bath on main level one-step to entry.  Modified independent prior to admission.  Presented to 02/02/2023 with left-sided weakness numbness and disconjugate gaze.  CT/MRI showed diffusion hyperintensity in the right PCA distribution affecting the medial temporal lobe and thalamus.  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  CTA right P2 occlusion presumably acute in the setting.  Extensive intracranial atherosclerosis.  CT perfusion of the brain showed increased ischemic range perfusion on the right PCA distribution measuring 36 cc as compared to 25 cc on prior imaging.  Admission chemistries unremarkable except glucose 115 BUN 24, hemoglobin A1c 6.4.  Echocardiogram with ejection fraction of 55% no wall motion abnormalities grade 1 diastolic dysfunction.  Patient did receive TNK.  MRI follow-up revealed progressive acute right PCA territory infarct with extension most notable in the right periatrial white matter and occipital lobe.  There was suspected subcentimeter hemorrhage centrally in the right thalamic infarct.  New punctate infarct in the left occipital lobe also seen.  Neurology follow-up placed on low-dose aspirin  and Plavix  for CVA prophylaxis x 3 weeks then Plavix  alone.  Subcutaneous heparin  for DVT prophylaxis.  Await plan for loop recorder.  Mild AKI 1.20-1.44 placed on gentle IV fluids since discontinued with latest creatinine 1.23.  Therapy evaluations completed due to patient decreased functional mobility and left-sided weakness was admitted for a comprehensive rehab program.   Patient currently requires max with mobility secondary to muscle weakness and muscle joint tightness, decreased cardiorespiratoy endurance, impaired timing and sequencing, unbalanced muscle activation, decreased coordination, and decreased motor planning,  , decreased midline orientation, decreased attention to left,  left side neglect, and decreased motor planning, decreased  initiation, decreased attention, decreased awareness, decreased problem solving, and decreased safety awareness, and decreased sitting balance, decreased standing balance, decreased postural control, hemiplegia, and decreased balance strategies.  Prior to hospitalization, patient was modified independent  with mobility and lived with Spouse (lives with Wife, Christine-CNA) in a House home.  Home access is 1Stairs to enter.  Patient will benefit from skilled PT intervention to maximize safe functional mobility, minimize fall risk, and decrease caregiver burden for planned discharge home with 24 hour assist.  Anticipate patient will benefit from follow up [facility] at discharge.  PT - End of Session Activity Tolerance: Tolerates 30+ min activity with multiple rests Endurance Deficit: Yes PT Assessment Rehab Potential (ACUTE/IP ONLY): Fair PT Barriers to Discharge: Inaccessible home environment;Decreased caregiver support;Home environment access/layout;Incontinence;Lack of/limited family support;Weight PT Patient demonstrates impairments in the following area(s): Balance;Edema;Endurance;Motor;Perception;Safety;Sensory PT Transfers Functional Problem(s): Bed Mobility;Bed to Chair;Car;Furniture PT Locomotion Functional Problem(s): Ambulation;Wheelchair Mobility;Stairs PT Plan PT Intensity: Minimum of 1-2 x/day ,45 to 90 minutes PT Frequency: 5 out of 7 days PT Duration Estimated Length of Stay: 17-21 days PT Treatment/Interventions: Ambulation/gait training;Community reintegration;DME/adaptive equipment instruction;Neuromuscular re-education;Psychosocial support;Stair training;UE/LE Strength taining/ROM;Wheelchair propulsion/positioning;Balance/vestibular training;Discharge planning;Functional electrical stimulation;Pain management;Skin care/wound management;Therapeutic Activities;UE/LE Coordination activities;Cognitive remediation/compensation;Disease management/prevention;Functional mobility  training;Patient/family education;Splinting/orthotics;Therapeutic Exercise;Visual/perceptual remediation/compensation PT Transfers Anticipated Outcome(s): min A PT Locomotion Anticipated Outcome(s): min A PT Recommendation Follow Up Recommendations: Home health PT Patient destination: Home Equipment Recommended: To be determined   PT Evaluation Precautions/Restrictions Precautions Precautions: Fall Precaution Comments: L-hemi, L-inattention, pusher syndrome, Loop recorder placed 2/7 Restrictions Weight Bearing Restrictions Per Provider Order: No Pain Interference Pain Interference Pain Effect on Sleep: 1. Rarely or not at all Pain Interference with Therapy Activities: 1. Rarely or not at all Pain Interference with Day-to-Day Activities: 1. Rarely or not at all Home Living/Prior Functioning Home Living Available Help at Discharge: Family;Available PRN/intermittently;Available 24 hours/day (pt reports pt wife will be able to provide 24/7 assist) Type of Home: House Home Access: Stairs to enter Entrance Stairs-Number of Steps: 1 Entrance Stairs-Rails: None Home Layout: Two level;Able to live on main level with bedroom/bathroom;Laundry or work area in basement;Full bath on main level Bathroom Shower/Tub: Tub/shower unit;Curtain Bathroom Toilet: Handicapped height Bathroom Accessibility: Yes  Lives With: Spouse (lives with Wife, Christine-CNA) Prior Function Level of Independence: Independent with basic ADLs;Independent with homemaking with ambulation;Independent with gait;Independent with transfers;Requires assistive device for independence (intermittent use of cane in the last 2 months in house and community 2/2 increased falls)  Able to Take Stairs?: Yes Driving: Yes Vocation: Full time employment field seismologist) Vision/Perception  Vision - History Ability to See in Adequate Light: 1 Impaired Vision - Assessment Eye Alignment: Within Functional Limits Alignment/Gaze Preference: Head  tilt (head tilt towards left side) Tracking/Visual Pursuits: Able to track stimulus in all quads without difficulty Saccades: Additional head turns occurred during testing;Decreased speed of saccadic movement Convergence: Impaired (comment) Diplopia Assessment: Present in far gaze Perception Perception: Impaired Preception Impairment Details: Inattention/Neglect Praxis Praxis: Impaired Praxis Impairment Details: Motor planning  Cognition Overall Cognitive Status: History of cognitive impairments - at baseline Arousal/Alertness: Awake/alert Orientation Level: Oriented X4 Year: 2025 Month: February Day of Week: Incorrect Sustained Attention: Impaired Sustained Attention Impairment: Functional basic;Functional complex Memory: Impaired Memory Impairment: Decreased short term memory;Retrieval deficit;Decreased recall of new information Awareness: Impaired Awareness Impairment: Emergent impairment Problem Solving: Impaired Problem Solving Impairment: Functional complex;Functional basic Safety/Judgment: Appears intact Sensation Sensation Light Touch: Impaired Detail Peripheral sensation comments: LUE/LLE numbness, no  reports of neuropathic pain. Light Touch Impaired Details: Impaired LUE;Impaired LLE Hot/Cold: Impaired by gross assessment Proprioception: Impaired by gross assessment Stereognosis: Impaired by gross assessment Coordination Gross Motor Movements are Fluid and Coordinated: No Fine Motor Movements are Fluid and Coordinated: No Coordination and Movement Description: Deficits due to L-sided hemiplegia/hemiparesis Motor  Motor Motor: Hemiplegia;Abnormal postural alignment and control Motor - Skilled Clinical Observations: L sided hemiplegia; decreased midline orientation  Trunk/Postural Assessment  Cervical Assessment Cervical Assessment: Exceptions to [facility] (L lateral flexion, able to correct with mirror as visual cue) Thoracic Assessment Thoracic Assessment:  Exceptions to [facility] (rounded shoulders) Lumbar Assessment Lumbar Assessment: Exceptions to [facility] (posterior pelvic tilt and L lateral lean) Postural Control Postural Control: Deficits on evaluation Head Control: Decreased Trunk Control: Decreased Righting Reactions: Delayed Protective Responses: Delayed  Balance Balance Balance Assessed: Yes Static Sitting Balance Static Sitting - Balance Support: Feet supported Static Sitting - Level of Assistance: 5: Stand by assistance;4: Min assist (CGA/min A) Dynamic Sitting Balance Dynamic Sitting - Balance Support: During functional activity Dynamic Sitting - Level of Assistance: 4: Min assist;3: Mod assist Dynamic Sitting - Balance Activities: Lateral lean/weight shifting;Forward lean/weight shifting Static Standing Balance Static Standing - Balance Support: Bilateral upper extremity supported Static Standing - Level of Assistance: 3: Mod assist;2: Max assist (+2 for safety 2/2 pusher with use of steady and RW) Dynamic Standing Balance Dynamic Standing - Level of Assistance: 2: Max assist;3: Mod assist (+2 for safety) Dynamic Standing - Balance Activities: Lateral lean/weight shifting Extremity Assessment  RUE Assessment RUE Assessment: Within Functional Limits LUE Assessment LUE Assessment: Exceptions to [facility] LUE Body System: Neuro Brunstrum levels for arm and hand: Arm;Hand Brunstrum level for arm: Stage I Presynergy Brunstrum level for hand: Stage V Independence from basic synergies RLE Assessment RLE Assessment: Within Functional Limits General Strength Comments: assessed in sitting LLE Assessment LLE Assessment: Exceptions to [facility] General Strength Comments: assessed in sitting, grossly 3-/5 with exception of DF 2/5  Care Tool Care Tool Bed Mobility Roll left and right activity   Roll left and right assist level: Maximal Assistance - Patient 25 - 49%    Sit to lying activity   Sit to lying assist level: Maximal Assistance -  Patient 25 - 49%    Lying to sitting on side of bed activity   Lying to sitting on side of bed assist level: the ability to move from lying on the back to sitting on the side of the bed with no back support.: Maximal Assistance - Patient 25 - 49%     Care Tool Transfers Sit to stand transfer   Sit to stand assist level: 2 Helpers    Chair/bed transfer   Chair/bed transfer assist level: 2 Web Designer transfer activity did not occur: Safety/medical concerns        Care Tool Locomotion Ambulation Ambulation activity did not occur: Safety/medical concerns        Walk 10 feet activity Walk 10 feet activity did not occur: Safety/medical concerns       Walk 50 feet with 2 turns activity Walk 50 feet with 2 turns activity did not occur: Safety/medical concerns      Walk 150 feet activity Walk 150 feet activity did not occur: Safety/medical concerns      Walk 10 feet on uneven surfaces activity Walk 10 feet on uneven surfaces activity did not occur: Safety/medical concerns      Stairs Stair activity did not occur: Safety/medical concerns  Walk up/down 1 step activity Walk up/down 1 step or curb (drop down) activity did not occur: Safety/medical concerns      Walk up/down 4 steps activity Walk up/down 4 steps activity did not occur: Safety/medical concerns      Walk up/down 12 steps activity Walk up/down 12 steps activity did not occur: Safety/medical concerns      Pick up small objects from floor   Pick up small object from the floor assist level: Dependent - Patient 0%    Wheelchair Is the patient using a wheelchair?: Yes Type of Wheelchair: Manual   Wheelchair assist level: Minimal Assistance - Patient > 75% Max wheelchair distance: 50  Wheel 50 feet with 2 turns activity   Assist Level: Minimal Assistance - Patient > 75%  Wheel 150 feet activity   Assist Level: Maximal Assistance - Patient 25 - 49%    Refer to Care Plan for Long Term  Goals  SHORT TERM GOAL WEEK 1 PT Short Term Goal 1 (Week 1): pt will perfrom sit to stand with LRAD and mod A PT Short Term Goal 2 (Week 1): pt will perform bed to chair transfer with LRAD and mod A PT Short Term Goal 3 (Week 1): Pt will perform bed mobility with LRAD and mod A PT Short Term Goal 4 (Week 1): pt will ambulate 50 feet with LRAD and +2 A  Recommendations for other services: None   Skilled Therapeutic Intervention Mobility Bed Mobility Bed Mobility: Rolling Right;Rolling Left;Supine to Sit;Sit to Supine Rolling Right: Maximal Assistance - Patient 25-49% Rolling Left: Maximal Assistance - Patient 25-49% Supine to Sit: Maximal Assistance - Patient - Patient 25-49% Sit to Supine: Maximal Assistance - Patient 25-49% Transfers Transfers: Sit to Stand;Stand to Sit;Squat Pivot Transfers Sit to Stand: 2 Helpers;Moderate Assistance - Patient 50-74% (RW, and steady) Stand to Sit: Moderate Assistance - Patient 50-74%;2 Helpers (RW and steady) Scientist, Clinical (histocompatibility And Immunogenetics) Transfers: Moderate Assistance - Patient 50-74%;2 Helpers Transfer via Lift Equipment: Engineering Geologist: No Gait Gait: No Stairs / Management Consultant: No Corporate Treasurer: Yes Wheelchair Assistance: Minimal assistance - Patient >75% Wheelchair Propulsion: Right upper extremity;Right lower extremity Wheelchair Parts Management: Needs assistance Distance: 50   Discharge Criteria: Patient will be discharged from PT if patient refuses treatment 3 consecutive times without medical reason, if treatment goals not met, if there is a change in medical status, if patient makes no progress towards goals or if patient is discharged from hospital.  The above assessment, treatment plan, treatment alternatives and goals were discussed and mutually agreed upon: by patient  Today's Interventions  Pt seated in recliner upon arrival. Pt agreeable to therapy. Pt denies any pain.    Evaluation completed (see details above and below) with education on PT POC and goals and individual treatment initiated with focus on transfer training and midline orientation.   Transfers:  pt performed sit to stand from recliner with use of rollator and +2 mod A, with therapist placing/stabilizing L UE on bar.    WC mobility:  Pt self propelled WC with R UE/R LE x50 feet with min A, verbal cues provided for technique, pt required mod A for posterior scoot in chair 2/2 anterior slippage with posterior weight shift.   Pt performed seated forward trunk lean x10 forehead to ball positioned in front of pt to facilitate anterior weight shift for safety and independence with transfers and WC mobility.   Pt performed squat pivot transfer with +2 mod A  progressing to +1 max A with +2 there for safety, with max verbal cues provided for technique.   Pt seated EOM, demos significant L lateral trunk lean, and L lateral cervical flexion, able to correct with sup-min A with use of mirror in front of pt as visual cue, able to sustain without UE support with CGA with static seated balance. Requires min to mod A for dynamic seated balance (reaching across body with R UE). Pt denies fear of falling when in midline or leaning to R.   Pt performed sit to stand with +2 mod A with RW and therapist stabilizing L UE on RW. Pt performed standing lateral weight shifting with +2 min-mod A and visual cue of mirror for midline orienation, with verbal/tactile cues provided for L LE quad activation, pt demos heavy L lateral weight shift.   Pt requesting to use bathroom. Pt utilized steady for [facility] commode transfer with +2 A for safety 2/2 L lateral trunk lean.   Pt continent of bowel. Pt performed sit to stand with +2 min-mod A. Pt required mod A for R lateral weight shift, pt demos intermittent buckling, verbal and tactile cues provided for L LE activation while +2 performing pericare and donning/doffing briefs with total  A.   Pt with tech and nurse to get assistance back to bed at end of session.      [facility], [place], DPT  02/22/2023, 3:45 PM

## 2023-02-22 NOTE — Plan of Care (Signed)
 Problem: RH Balance Goal: LTG Patient will maintain dynamic sitting balance (PT) Description: LTG:  Patient will maintain dynamic sitting balance with assistance during mobility activities (PT) Flowsheets (Taken 02/22/2023 1602) LTG: Pt will maintain dynamic sitting balance during mobility activities with:: Independent with assistive device  Goal: LTG Patient will maintain dynamic standing balance (PT) Description: LTG:  Patient will maintain dynamic standing balance with assistance during mobility activities (PT) Flowsheets (Taken 02/22/2023 1602) LTG: Pt will maintain dynamic standing balance during mobility activities with:: Minimal Assistance - Patient > 75%   Problem: Sit to Stand Goal: LTG:  Patient will perform sit to stand with assistance level (PT) Description: LTG:  Patient will perform sit to stand with assistance level (PT) Flowsheets (Taken 02/22/2023 1602) LTG: PT will perform sit to stand in preparation for functional mobility with assistance level: Minimal Assistance - Patient > 75%   Problem: RH Bed Mobility Goal: LTG Patient will perform bed mobility with assist (PT) Description: LTG: Patient will perform bed mobility with assistance, with/without cues (PT). Flowsheets (Taken 02/22/2023 1602) LTG: Pt will perform bed mobility with assistance level of: Supervision/Verbal cueing   Problem: RH Bed to Chair Transfers Goal: LTG Patient will perform bed/chair transfers w/assist (PT) Description: LTG: Patient will perform bed to chair transfers with assistance (PT). Flowsheets (Taken 02/22/2023 1602) LTG: Pt will perform Bed to Chair Transfers with assistance level: Minimal Assistance - Patient > 75%   Problem: RH Car Transfers Goal: LTG Patient will perform car transfers with assist (PT) Description: LTG: Patient will perform car transfers with assistance (PT). Flowsheets (Taken 02/22/2023 1602) LTG: Pt will perform car transfers with assist:: Minimal Assistance - Patient > 75%    Problem: RH Furniture Transfers Goal: LTG Patient will perform furniture transfers w/assist (OT/PT) Description: LTG: Patient will perform furniture transfers  with assistance (OT/PT). Flowsheets (Taken 02/22/2023 1602) LTG: Pt will perform furniture transfers with assist:: Minimal Assistance - Patient > 75%   Problem: RH Ambulation Goal: LTG Patient will ambulate in controlled environment (PT) Description: LTG: Patient will ambulate in a controlled environment, # of feet with assistance (PT). Flowsheets (Taken 02/22/2023 1602) LTG: Pt will ambulate in controlled environ  assist needed:: Minimal Assistance - Patient > 75% LTG: Ambulation distance in controlled environment: 150 feet with LRAD Goal: LTG Patient will ambulate in home environment (PT) Description: LTG: Patient will ambulate in home environment, # of feet with assistance (PT). Flowsheets (Taken 02/22/2023 1602) LTG: Pt will ambulate in home environ  assist needed:: Minimal Assistance - Patient > 75% LTG: Ambulation distance in home environment: 50 feet with LRAD   Problem: RH Wheelchair Mobility Goal: LTG Patient will propel w/c in controlled environment (PT) Description: LTG: Patient will propel wheelchair in controlled environment, # of feet with assist (PT) Flowsheets (Taken 02/22/2023 1602) LTG: Pt will propel w/c in controlled environ  assist needed:: Supervision/Verbal cueing LTG: Propel w/c distance in controlled environment: 150 Goal: LTG Patient will propel w/c in home environment (PT) Description: LTG: Patient will propel wheelchair in home environment, # of feet with assistance (PT). Flowsheets (Taken 02/22/2023 1602) LTG: Pt will propel w/c in home environ  assist needed:: Supervision/Verbal cueing LTG: Propel w/c distance in home environment: 75 feet with LRAD   Problem: RH Stairs Goal: LTG Patient will ambulate up and down stairs w/assist (PT) Description: LTG: Patient will ambulate up and down # of stairs with  assistance (PT) Flowsheets (Taken 02/22/2023 1602) LTG: Pt will ambulate up/down stairs assist needed:: Moderate Assistance - Patient  50 - 74% LTG: Pt will  ambulate up and down number of stairs: 4 steps with LRAD per home set up

## 2023-02-22 NOTE — Evaluation (Signed)
 Occupational Therapy Assessment and Plan  Patient Details  Name: Edward Rodgers MRN: [id number] Date of Birth: 15-Jan-1950  OT Diagnosis: abnormal posture, cognitive deficits, disturbance of vision, hemiplegia affecting non-dominant side, muscle weakness (generalized), and decreased activity tolerance Rehab Potential: Rehab Potential (ACUTE ONLY): Fair ELOS: 17-21 days   Today's Date: 02/22/2023 OT Individual Time: 9094-8984 OT Individual Time Calculation (min): 70 min     Today's Date: 02/22/2023 OT Individual Time: 1120-1203 OT Individual Time Calculation (min): 43 min     Hospital Problem: Principal Problem:   Acute right PCA stroke (HCC)   Past Medical History:  Past Medical History:  Diagnosis Date   Cancer (HCC) 2002   prostate   CHF (congestive heart failure) (HCC)    Gout    Hypertension    Thyroid  disease    Past Surgical History:  Past Surgical History:  Procedure Laterality Date   COLONOSCOPY N/A 07/15/2017   Procedure: COLONOSCOPY;  Surgeon: Golda Claudis PENNER, MD;  Location: [facility];  Service: Endoscopy;  Laterality: N/A;  830   IR CT HEAD LTD  02/14/2023   IR PERCUTANEOUS ART THROMBECTOMY/INFUSION INTRACRANIAL INC DIAG ANGIO  02/14/2023   POLYPECTOMY  07/15/2017   Procedure: POLYPECTOMY;  Surgeon: Golda Claudis PENNER, MD;  Location: [facility];  Service: Endoscopy;;  colon   PROSTATE SURGERY     RADIOLOGY WITH ANESTHESIA Left 02/14/2023   Procedure: IR WITH ANESTHESIA;  Surgeon: Radiologist, Medication, MD;  Location: [facility];  Service: Radiology;  Laterality: Left;   RADIOLOGY WITH ANESTHESIA N/A 02/14/2023   Procedure: IR WITH ANESTHESIA;  Surgeon: Radiologist, Medication, MD;  Location: [facility] OR;  Service: Radiology;  Laterality: N/A;    Assessment & Plan Clinical Impression: Patient is a 73 year old right-handed male with history of diastolic congestive heart failure, gout, hypothyroidism, prior cerebellar infarct with residual diplopia maintained on full-strength  aspirin , prostate cancer, tobacco use. Per chart review patient lives with spouse. Two-level home bed and bath on main level one-step to entry. Modified independent prior to admission. Presented to 02/02/2023 with left-sided weakness numbness and disconjugate gaze. CT/MRI showed diffusion hyperintensity in the right PCA distribution affecting the medial temporal lobe and thalamus. 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 CTA right P2 occlusion presumably acute in the setting. Extensive intracranial atherosclerosis. CT perfusion of the brain showed increased ischemic range perfusion on the right PCA distribution measuring 36 cc as compared to 25 cc on prior imaging. Admission chemistries unremarkable except glucose 115 BUN 24, hemoglobin A1c 6.4. Echocardiogram with ejection fraction of 55% no wall motion abnormalities grade 1 diastolic dysfunction. Patient did receive TNK. MRI follow-up revealed progressive acute right PCA territory infarct with extension most notable in the right periatrial white matter and occipital lobe. There was suspected subcentimeter hemorrhage centrally in the right thalamic infarct. New punctate infarct in the left occipital lobe also seen. Neurology follow-up placed on low-dose aspirin  and Plavix  for CVA prophylaxis x 3 weeks then Plavix  alone. Subcutaneous heparin  for DVT prophylaxis. Await plan for loop recorder. Mild AKI 1.20-1.44 placed on gentle IV fluids since discontinued with latest creatinine 1.23. Therapy evaluations completed due to patient decreased functional mobility and left-sided weakness was admitted for a comprehensive rehab program.  Patient transferred to CIR on 02/21/2023 .    Patient currently requires mod-max A with basic self-care skills secondary to muscle weakness, decreased cardiorespiratoy endurance, impaired timing and sequencing, unbalanced muscle activation, and decreased coordination, decreased visual perceptual skills,  decreased midline orientation and  decreased attention to left, decreased attention, decreased awareness, decreased problem solving, and decreased memory, and decreased sitting balance, decreased standing balance, decreased postural control, hemiplegia, and decreased balance strategies.  Prior to hospitalization, patient could complete BADLs with Mod I.   Patient will benefit from skilled intervention to decrease level of assist with basic self-care skills prior to discharge home with care partner.  Anticipate patient will require minimal physical assistance and follow up home health.  OT - End of Session Activity Tolerance: Tolerates 10 - 20 min activity with multiple rests Endurance Deficit: Yes OT Assessment Rehab Potential (ACUTE ONLY): Fair OT Barriers to Discharge: Lack of/limited family support;Weight OT Patient demonstrates impairments in the following area(s): Balance;Cognition;Edema;Endurance;Motor;Pain;Perception;Safety;Sensory;Vision OT Basic ADL's Functional Problem(s): Eating;Grooming;Bathing;Dressing;Toileting OT Transfers Functional Problem(s): Toilet;Tub/Shower OT Additional Impairment(s): Fuctional Use of Upper Extremity OT Plan OT Intensity: Minimum of 1-2 x/day, 45 to 90 minutes OT Frequency: 5 out of 7 days OT Duration/Estimated Length of Stay: 17-21 days OT Treatment/Interventions: Balance/vestibular training;Cognitive remediation/compensation;Community reintegration;Discharge planning;Disease mangement/prevention;DME/adaptive equipment instruction;Functional electrical stimulation;Functional mobility training;Neuromuscular re-education;Pain management;Patient/family education;Psychosocial support;Self Care/advanced ADL retraining;Skin care/wound managment;Splinting/orthotics;Therapeutic Activities;Therapeutic Exercise;UE/LE Strength taining/ROM;UE/LE Coordination activities;Visual/perceptual remediation/compensation;Wheelchair propulsion/positioning OT Self Feeding  Anticipated Outcome(s): Mod I OT Basic Self-Care Anticipated Outcome(s): Min A OT Toileting Anticipated Outcome(s): Min A OT Bathroom Transfers Anticipated Outcome(s): Min A OT Recommendation Recommendations for Other Services: Speech consult;Therapeutic Recreation consult Therapeutic Recreation Interventions: Stress management;Pet therapy Patient destination: Home Follow Up Recommendations: Home health OT Equipment Recommended: To be determined   OT Evaluation Precautions/Restrictions  Precautions Precautions: Fall Precaution Comments: L-hemi, L-inattention, pusher syndrome, Loop recorder placed 2/7 Restrictions Weight Bearing Restrictions Per Provider Order: No General Chart Reviewed: Yes Family/Caregiver Present: Yes (Wife, Christine) Pain Pain Assessment Pain Scale: 0-10 Pain Score: 3  Pain Type: Acute pain Pain Location: Leg Pain Orientation: Left Pain Descriptors / Indicators: Numbness Pain Intervention(s): Medication (See eMAR) Home Living/Prior Functioning Home Living Family/patient expects to be discharged to:: Private residence Living Arrangements: Spouse/significant other Available Help at Discharge: Family, Available PRN/intermittently Type of Home: House Home Access: Stairs to enter Secretary/administrator of Steps: 1 Entrance Stairs-Rails: None Home Layout: Two level, Able to live on main level with bedroom/bathroom, Laundry or work area in basement, [employer] on main level Bathroom Shower/Tub: Tub/shower unit, Curtain Public [employer] Account Executive, detachable shower head, no grab bars.) Firefighter: Handicapped height Bathroom Accessibility: Yes  Lives With: Spouse (Wife is a CNA, currently on leave.) IADL History Current License: Yes Occupation: Retired Leisure and Hobbies: Walking, gardening, pastorial work. Prior Function Level of Independence: Independent with basic ADLs, Independent with homemaking with ambulation, Independent with gait, Independent with  transfers (Intermediate use of single-point cane for community ambulation.) Driving: Yes Vision Baseline Vision/History: 1 Wears glasses Ability to See in Adequate Light: 1 Impaired Patient Visual Report: Diplopia Vision Assessment?: Yes Eye Alignment: Within Functional Limits Ocular Range of Motion: Within Functional Limits Alignment/Gaze Preference: Head tilt (Head tilt towards L-side) Tracking/Visual Pursuits: Able to track stimulus in all quads without difficulty Saccades: Additional head turns occurred during testing;Decreased speed of saccadic movement Convergence: Impaired (comment) Diplopia Assessment: Present in far gaze Perception  Perception: Impaired Praxis Praxis: Impaired Praxis Impairment Details: Motor planning Cognition Cognition Overall Cognitive Status: History of cognitive impairments - at baseline Arousal/Alertness: Lethargic Orientation Level: Person;Place;Situation Person: Oriented Place: Oriented Situation: Oriented Memory: Impaired Memory Impairment: Decreased short term memory;Retrieval deficit;Decreased recall of new information Sustained Attention: Impaired Sustained Attention Impairment: Functional basic;Functional complex Awareness: Impaired Awareness Impairment: Emergent impairment Problem Solving: Impaired Problem  Solving Impairment: Functional complex;Functional basic Safety/Judgment: Appears intact Brief Interview for Mental Status (BIMS) Repetition of Three Words (First Attempt): 3 Temporal Orientation: Year: Correct Temporal Orientation: Month: Accurate within 5 days Temporal Orientation: Day: Correct Recall: Sock: No, could not recall Recall: Blue: No, could not recall Recall: Bed: No, could not recall BIMS Summary Score: 9 Sensation Sensation Light Touch: Impaired Detail Peripheral sensation comments: LUE/LLE numbness, no reports of neuropathic pain. Light Touch Impaired Details: Impaired LUE;Impaired LLE Hot/Cold: Impaired  by gross assessment Proprioception: Impaired by gross assessment Stereognosis: Impaired by gross assessment Coordination Gross Motor Movements are Fluid and Coordinated: No Fine Motor Movements are Fluid and Coordinated: No Coordination and Movement Description: Deficits due to L-sided hemiplegia/hemiparesis Motor  Motor Motor: Hemiplegia;Abnormal postural alignment and control Motor - Skilled Clinical Observations: L sided hemiplegia; decreased midline orientation  Trunk/Postural Assessment  Cervical Assessment Cervical Assessment: Exceptions to [facility] (L lateral flexion) Thoracic Assessment Thoracic Assessment: Exceptions to [facility] (Rounded shoulders) Lumbar Assessment Lumbar Assessment: Exceptions to [facility] (Posterior tilt and L lateral lean) Postural Control Postural Control: Deficits on evaluation Head Control: Decreased Trunk Control: Decreased Righting Reactions: Delayed Protective Responses: Delayed  Balance Balance Balance Assessed: Yes Static Sitting Balance Static Sitting - Balance Support: Feet supported Static Sitting - Level of Assistance: 5: Stand by assistance;4: Min assist (CGA-Min A) Dynamic Sitting Balance Dynamic Sitting - Balance Support: During functional activity Dynamic Sitting - Level of Assistance: 4: Min assist;3: Mod assist Dynamic Sitting - Balance Activities: Lateral lean/weight shifting;Forward lean/weight shifting Static Standing Balance Static Standing - Balance Support: Bilateral upper extremity supported Static Standing - Level of Assistance: 3: Mod assist;2: Max assist (L pusher syndrome; use of stedy) Extremity/Trunk Assessment RUE Assessment RUE Assessment: Within Functional Limits LUE Assessment LUE Assessment: Exceptions to [facility] LUE Body System: Neuro Brunstrum levels for arm and hand: Arm;Hand Brunstrum level for arm: Stage I Presynergy Brunstrum level for hand: Stage V Independence from basic synergies  Care Tool Care Tool Self  Care Eating   Eating Assist Level: Minimal Assistance - Patient > 75%    Oral Care    Oral Care Assist Level: Minimal Assistance - Patient > 75%    Bathing   Body parts bathed by patient: Left arm;Chest;Abdomen;Front perineal area;Right upper leg;Left upper leg;Right lower leg;Face Body parts bathed by helper: Right arm;Buttocks;Left lower leg   Assist Level: Moderate Assistance - Patient 50 - 74%    Upper Body Dressing(including orthotics)       Assist Level: Moderate Assistance - Patient 50 - 74%    Lower Body Dressing (excluding footwear)     Assist for lower body dressing: Maximal Assistance - Patient 25 - 49%    Putting on/Taking off footwear   What is the patient wearing?: Non-skid slipper socks Assist for footwear: Dependent - Patient 0%       Care Tool Toileting Toileting activity   Assist for toileting: Maximal Assistance - Patient 25 - 49%     Care Tool Bed Mobility Roll left and right activity        Sit to lying activity        Lying to sitting on side of bed activity         Care Tool Transfers Sit to stand transfer        Chair/bed transfer         Toilet transfer   Assist Level: Maximal Assistance - Patient 24 - 49%     Care Tool Cognition  Expression of Ideas  and Wants Expression of Ideas and Wants: 3. Some difficulty - exhibits some difficulty with expressing needs and ideas (e.g, some words or finishing thoughts) or speech is not clear  Understanding Verbal and Non-Verbal Content Understanding Verbal and Non-Verbal Content: 3. Usually understands - understands most conversations, but misses some part/intent of message. Requires cues at times to understand   Memory/Recall Ability Memory/Recall Ability : That he or she is in a hospital/hospital unit   Refer to Care Plan for Long Term Goals  SHORT TERM GOAL WEEK 1 OT Short Term Goal 1 (Week 1): Pt will attend to L hemibody with Mod levels of cuing. OT Short Term Goal 2 (Week 1): Pt will  perform BSC/toilet transfer with Mod A. OT Short Term Goal 3 (Week 1): Pt with threading LB garments with supervision.  Recommendations for other services: Therapeutic Recreation  Pet therapy and Stress management   Skilled Therapeutic Intervention  Session 1: Session began with introduction to OT role, OT POC, and general orientation to rehab unit/schedule. Pt performs bed mobility with Max A (x1), stedy used for safe transfer to WC, Mod A (x1) with +2 providing CGA-Min A. Time dedicated to visual assessment with details noted above, pt remained sitting in [facility], wife present, posey belt alarmed.    Session 2:  Pt received in care of NT on BSC/toilet. Session focused on full-body sponge-bathing with levels of assistance noted below. Wife present throughout providing intermediate +2 assistance for transfer with stedy. Pt remained sitting in recliner, posey belt alarmed.   ADL ADL Eating: Minimal assistance Where Assessed-Eating: Bed level Grooming: Minimal assistance Where Assessed-Grooming: Sitting at sink Upper Body Bathing: Moderate assistance;Minimal assistance Where Assessed-Upper Body Bathing: Sitting at sink Lower Body Bathing: Minimal assistance;Moderate assistance (Use of stedy) Where Assessed-Lower Body Bathing: Sitting at sink;Standing at sink Upper Body Dressing: Moderate assistance Where Assessed-Upper Body Dressing: Sitting at sink Lower Body Dressing: Maximal assistance Where Assessed-Lower Body Dressing: Standing at sink (Use stedy) Toileting: Maximal assistance Where Assessed-Toileting: Toilet;Bedside Commode Toilet Transfer: Moderate assistance Toilet Transfer Method: Other (comment) Laurent) Toilet Transfer Equipment: Bedside commode;Grab bars Tub/Shower Transfer: Unable to assess Film/video Editor: Unable to assess Mobility  Transfers Sit to Stand: Minimal Assistance - Patient > 75%;Moderate Assistance - Patient 50-74% Stand to Sit: Minimal Assistance -  Patient > 75%;Moderate Assistance - Patient 50-74%   Discharge Criteria: Patient will be discharged from OT if patient refuses treatment 3 consecutive times without medical reason, if treatment goals not met, if there is a change in medical status, if patient makes no progress towards goals or if patient is discharged from hospital.  The above assessment, treatment plan, treatment alternatives and goals were discussed and mutually agreed upon: by patient  Nereida Habermann, OTR/L, MSOT  02/22/2023, 12:43 PM

## 2023-02-23 ENCOUNTER — Encounter (HOSPITAL_COMMUNITY): Payer: Self-pay | Admitting: Internal Medicine

## 2023-02-23 DIAGNOSIS — Z8679 Personal history of other diseases of the circulatory system: Secondary | ICD-10-CM

## 2023-02-23 DIAGNOSIS — N179 Acute kidney failure, unspecified: Secondary | ICD-10-CM

## 2023-02-23 DIAGNOSIS — G4700 Insomnia, unspecified: Secondary | ICD-10-CM

## 2023-02-23 DIAGNOSIS — M4802 Spinal stenosis, cervical region: Secondary | ICD-10-CM

## 2023-02-23 DIAGNOSIS — K5900 Constipation, unspecified: Secondary | ICD-10-CM

## 2023-02-23 DIAGNOSIS — I63531 Cerebral infarction due to unspecified occlusion or stenosis of right posterior cerebral artery: Secondary | ICD-10-CM | POA: Diagnosis not present

## 2023-02-23 LAB — CBC WITH DIFFERENTIAL/PLATELET
Abs Immature Granulocytes: 0.01 10*3/uL (ref 0.00–0.07)
Basophils Absolute: 0.1 10*3/uL (ref 0.0–0.1)
Basophils Relative: 1 %
Eosinophils Absolute: 0.2 10*3/uL (ref 0.0–0.5)
Eosinophils Relative: 4 %
HCT: 39.7 % (ref 39.0–52.0)
Hemoglobin: 12.7 g/dL — ABNORMAL LOW (ref 13.0–17.0)
Immature Granulocytes: 0 %
Lymphocytes Relative: 30 %
Lymphs Abs: 1.7 10*3/uL (ref 0.7–4.0)
MCH: 27.8 pg (ref 26.0–34.0)
MCHC: 32 g/dL (ref 30.0–36.0)
MCV: 86.9 fL (ref 80.0–100.0)
Monocytes Absolute: 0.7 10*3/uL (ref 0.1–1.0)
Monocytes Relative: 13 %
Neutro Abs: 2.9 10*3/uL (ref 1.7–7.7)
Neutrophils Relative %: 52 %
Platelets: 189 10*3/uL (ref 150–400)
RBC: 4.57 MIL/uL (ref 4.22–5.81)
RDW: 14.2 % (ref 11.5–15.5)
WBC: 5.6 10*3/uL (ref 4.0–10.5)
nRBC: 0 % (ref 0.0–0.2)

## 2023-02-23 LAB — COMPREHENSIVE METABOLIC PANEL
ALT: 18 U/L (ref 0–44)
AST: 16 U/L (ref 15–41)
Albumin: 3.5 g/dL (ref 3.5–5.0)
Alkaline Phosphatase: 52 U/L (ref 38–126)
Anion gap: 19 — ABNORMAL HIGH (ref 5–15)
BUN: 29 mg/dL — ABNORMAL HIGH (ref 8–23)
CO2: 22 mmol/L (ref 22–32)
Calcium: 10.6 mg/dL — ABNORMAL HIGH (ref 8.9–10.3)
Chloride: 97 mmol/L — ABNORMAL LOW (ref 98–111)
Creatinine, Ser: 1.38 mg/dL — ABNORMAL HIGH (ref 0.61–1.24)
GFR, Estimated: 55 mL/min — ABNORMAL LOW (ref >60)
Glucose, Bld: 103 mg/dL — ABNORMAL HIGH (ref 70–99)
Potassium: 4.3 mmol/L (ref 3.5–5.1)
Sodium: 138 mmol/L (ref 135–145)
Total Bilirubin: 0.6 mg/dL (ref 0.0–1.2)
Total Protein: 7.1 g/dL (ref 6.5–8.1)

## 2023-02-23 MED ORDER — MELATONIN 3 MG PO TABS
3.0000 mg | ORAL_TABLET | Freq: Every day | ORAL | Status: DC
  Administered 2023-02-23 – 2023-03-27 (×32): 3 mg via ORAL
  Filled 2023-02-23 (×32): qty 1

## 2023-02-23 MED ORDER — SPIRONOLACTONE 12.5 MG HALF TABLET
12.5000 mg | ORAL_TABLET | Freq: Every day | ORAL | Status: DC
  Administered 2023-02-24: 12.5 mg via ORAL
  Filled 2023-02-23: qty 1

## 2023-02-23 NOTE — Progress Notes (Signed)
 Inpatient Rehabilitation  Patient information reviewed and entered into eRehab system by [facility]. Karen Kays., CCC/SLP, PPS Coordinator.  Information including medical coding, functional ability and quality indicators will be reviewed and updated through discharge.

## 2023-02-23 NOTE — Evaluation (Signed)
 Speech Language Pathology Assessment and Plan  Patient Details  Name: Edward Rodgers MRN: [id number] Date of Birth: March 22, 1950  SLP Diagnosis: Cognitive Impairments  Rehab Potential: Good ELOS: 17-21 days   Today's Date: 02/23/2023 SLP Individual Time: 4098-1191 SLP Individual Time Calculation (min): 55 min  Hospital Problem: Principal Problem:   Acute right PCA stroke [facility])  Past Medical History:  Past Medical History:  Diagnosis Date   Cancer (HCC) 2002   prostate   CHF (congestive heart failure) (HCC)    Gout    Hypertension    Thyroid  disease    Past Surgical History:  Past Surgical History:  Procedure Laterality Date   COLONOSCOPY N/A 07/15/2017   Procedure: COLONOSCOPY;  Surgeon: Ruby Corporal, MD;  Location: [facility];  Service: Endoscopy;  Laterality: N/A;  830   IR CT HEAD LTD  02/14/2023   IR PERCUTANEOUS ART THROMBECTOMY/INFUSION INTRACRANIAL INC DIAG ANGIO  02/14/2023   LOOP RECORDER INSERTION N/A 02/20/2023   Procedure: LOOP RECORDER INSERTION;  Surgeon: Verona Goodwill, MD;  Location: [facility] INVASIVE CV LAB;  Service: Cardiovascular;  Laterality: N/A;   POLYPECTOMY  07/15/2017   Procedure: POLYPECTOMY;  Surgeon: Ruby Corporal, MD;  Location: [facility];  Service: Endoscopy;;  colon   PROSTATE SURGERY     RADIOLOGY WITH ANESTHESIA Left 02/14/2023   Procedure: IR WITH ANESTHESIA;  Surgeon: Radiologist, Medication, MD;  Location: [facility] OR;  Service: Radiology;  Laterality: Left;   RADIOLOGY WITH ANESTHESIA N/A 02/14/2023   Procedure: IR WITH ANESTHESIA;  Surgeon: Radiologist, Medication, MD;  Location: [facility] OR;  Service: Radiology;  Laterality: N/A;    Assessment / Plan / Recommendation Clinical Impression Edward Rodgers is a 73 year old right-handed male with history of diastolic congestive heart failure, gout, hypothyroidism, prior cerebellar infarct with residual diplopia maintained on full-strength aspirin , prostate cancer, tobacco use. Per chart review patient lives  with spouse. Two-level home bed and bath on main level one-step to entry. Modified independent prior to admission. Presented to 02/02/2023 with left-sided weakness numbness and disconjugate gaze. CT/MRI showed diffusion hyperintensity in the right PCA distribution affecting the medial temporal lobe and thalamus. 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 CTA right P2 occlusion presumably acute in the setting. Extensive intracranial atherosclerosis. CT perfusion of the brain showed increased ischemic range perfusion on the right PCA distribution measuring 36 cc as compared to 25 cc on prior imaging. Patient did receive TNK. MRI follow-up revealed progressive acute right PCA territory infarct with extension most notable in the right periatrial white matter and occipital lobe. There was suspected subcentimeter hemorrhage centrally in the right thalamic infarct. New punctate infarct in the left occipital lobe also seen. Therapy evaluations completed due to patient decreased functional mobility and left-sided weakness was admitted for a comprehensive rehab program.   Swallowing: Pt was seen for a bedside swallow evaluation and he presents with suspected functional oropharyngeal swallowing abilities. Oral mechanism exam was unremarkable.  Pt consumed trials of thin liquid and regular solids.  Pt fed himself and he demonstrated good bolus acceptance, timely mastication, suspected timely AP transport/swallow initiation, and consistent hyolaryngeal elevation/excursion to observation and palpation.  No overt s/sx of aspiration were observed with any trials.  Recommend continuation of regular solids and thin liquids with medications administered whole with liquid.  No further skilled ST is warranted at this time targeting dysphagia.    Cognitive-Linguistic Evaluation: SLP conducted cognitive-linguistic evaluation utilizing COGNISTAT and patient interview. 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 On COGNISTAT, patient exhibited significant deficits in the areas of delayed recall, problem solving, and reasoning with strengths in the areas of orientation, immediate recall, and sustained attention. Patient endorses changes to memory but is unaware of problem solving deficits. He did demonstrate some improved awareness after task completion, stating that he may have done better on calculations portion prior to his stroke. Patient exhibited no overt changes to speech or language compared to baseline per his report. Patient would benefit from SLP services during inpatient rehabilitation stay to target all aforementioned deficits. Patient was left in lowered bed with call bell in reach and bed alarm set. SLP will continue to target goals per plan of care.     Skilled Therapeutic Interventions          SLP conducted skilled evaluation session targeting assessment of speech, language, swallowing, and cognition. Utilized bedside swallow evaluation, COGNISTAT, and patient interview. Full results above.    SLP Assessment  Patient will need skilled Speech Lanaguage Pathology Services during CIR admission    Recommendations  SLP Diet Recommendations: Thin;Age appropriate regular solids Liquid Administration via: Straw;Cup Medication Administration: Whole meds with liquid Supervision: Patient able to self feed Compensations: Minimize environmental distractions Postural Changes and/or Swallow Maneuvers: Seated upright 90 degrees Oral Care Recommendations: Oral care BID Recommendations for Other Services: Neuropsych consult Patient destination: Home Follow up Recommendations: Outpatient SLP;Home Health SLP;24 hour supervision/assistance Equipment Recommended: None recommended by SLP    SLP Frequency 3 to 5 out of 7 days   SLP Duration  SLP Intensity  SLP Treatment/Interventions 17-21 days  Minumum of 1-2 x/day, 30 to 90  minutes  Cognitive remediation/compensation;Environmental controls;Cueing hierarchy;Functional tasks;Therapeutic Activities;Internal/external aids;Patient/family education    Pain Pain Assessment Pain Scale: 0-10 Pain Score: 0-No pain  Prior Functioning Cognitive/Linguistic Baseline: Within functional limits Type of Home: House  Lives With: Spouse Available Help at Discharge: Family;Available PRN/intermittently;Available 24 hours/day Education: information not obtained Vocation: Full time employment  SLP Evaluation Cognition Overall Cognitive Status: Within Functional Limits for tasks assessed Arousal/Alertness: Awake/alert Orientation Level: Oriented X4 Year: 2025 Month: February Day of Week: Correct Attention: Sustained;Focused Focused Attention: Appears intact Sustained Attention: Appears intact Memory: Impaired Memory Impairment: Decreased short term memory;Retrieval deficit;Decreased recall of new information Decreased Short Term Memory: Verbal basic;Functional basic Awareness: Impaired Awareness Impairment: Intellectual impairment;Emergent impairment;Anticipatory impairment Problem Solving: Impaired Problem Solving Impairment: Verbal basic;Functional basic Executive Function: Organizing;Self Monitoring;Self Correcting Organizing: Impaired Organizing Impairment: Functional complex Self Monitoring: Impaired Self Monitoring Impairment: Verbal basic;Functional basic Self Correcting: Impaired Self Correcting Impairment: Verbal basic;Functional basic Safety/Judgment: Impaired Comments: Patient had fallen out of chair via sliding onto the floor and made no attempt to receive help. SLP found patient outside of scheduled session time and alerted nursing staff/provided assistance.  Comprehension Auditory Comprehension Overall Auditory Comprehension: Appears within functional limits for tasks assessed Expression Expression Primary Mode of Expression: Verbal Verbal  Expression Overall Verbal Expression: Appears within functional limits for tasks assessed Oral Motor Oral Motor/Sensory Function Overall Oral Motor/Sensory Function: Within functional limits Motor Speech Overall Motor Speech: Appears within functional limits for tasks assessed  Care Tool Care Tool Cognition Ability to hear (with hearing aid or hearing appliances if normally used Ability to hear (with hearing aid or hearing appliances if normally used): 1. Minimal difficulty - difficulty in some environments (e.g. when person speaks softly or setting is noisy)   Expression of Ideas and Wants Expression of Ideas and Wants:  3. Some difficulty - exhibits some difficulty with expressing needs and ideas (e.g, some words or finishing thoughts) or speech is not clear   Understanding Verbal and Non-Verbal Content Understanding Verbal and Non-Verbal Content: 3. Usually understands - understands most conversations, but misses some part/intent of message. Requires cues at times to understand  Memory/Recall Ability Memory/Recall Ability : That he or she is in a hospital/hospital unit   Bedside Swallowing Assessment General Date of Onset: 02/15/23 Previous Swallow Assessment: bedside swallow evaluation 02/15/23 Diet Prior to this Study: Regular;Thin liquids (Level 0) Temperature Spikes Noted: No Respiratory Status: Room air History of Recent Intubation: No Behavior/Cognition: Alert;Cooperative;Pleasant mood Oral Cavity - Dentition: Adequate natural dentition Self-Feeding Abilities: Able to feed self Vision: Functional for self-feeding Patient Positioning: Upright in chair/Tumbleform Baseline Vocal Quality: Normal Volitional Cough: Strong Volitional Swallow: Able to elicit  Oral Care Assessment Oral Assessment  (WDL): Within Defined Limits Lips: Pink;Asymmetrical Teeth: Missing (Comment) Tongue: Pink;Moist Mucous Membrane(s): Moist;Pink Saliva: Moist, saliva free flowing Level of  Consciousness: Alert Is patient on any of following O2 devices?: None of the above Nutritional status: No high risk factors Oral Assessment Risk : Low Risk Ice Chips Ice chips: Not tested Thin Liquid Thin Liquid: Within functional limits Presentation: Cup;Straw Nectar Thick Nectar Thick Liquid: Not tested Honey Thick Honey Thick Liquid: Not tested Puree Puree: Not tested Solid Solid: Within functional limits Presentation: Self Fed BSE Assessment Risk for Aspiration Impact on safety and function: No limitations Other Related Risk Factors: Deconditioning;Cognitive impairment  Short Term Goals: Week 1: SLP Short Term Goal 1 (Week 1): Patient will solve basic environmental and functional problems with 80% accuracy given max assist. SLP Short Term Goal 2 (Week 1): Patient will utilize external and internal memory aids to recall daily information with 75% accuracy given max assist. SLP Short Term Goal 3 (Week 1): Patient will demonstrate awareness of deficits by stating 2 cognitive and 2 physical deficits when provided with max verbal cues.  Refer to Care Plan for Long Term Goals  Recommendations for other services: Neuropsych  Discharge Criteria: Patient will be discharged from SLP if patient refuses treatment 3 consecutive times without medical reason, if treatment goals not met, if there is a change in medical status, if patient makes no progress towards goals or if patient is discharged from hospital.  The above assessment, treatment plan, treatment alternatives and goals were discussed and mutually agreed upon: by patient  Lawerence Dery, M.A., CCC-SLP  Aanvi Voyles A Eian Vandervelden 02/23/2023, 10:50 AM

## 2023-02-23 NOTE — Progress Notes (Addendum)
Physical Therapy Session Note  Patient Details  Name: Edward Rodgers MRN: [id number] Date of Birth: 1950-07-16  Today's Date: 02/23/2023 PT Individual Time: 1300-1420 PT Individual Time Calculation (min): 80 min   Short Term Goals: Week 1:  PT Short Term Goal 1 (Week 1): pt will perfrom sit to stand with LRAD and mod A PT Short Term Goal 2 (Week 1): pt will perform bed to chair transfer with LRAD and mod A PT Short Term Goal 3 (Week 1): Pt will perform bed mobility with LRAD and mod A PT Short Term Goal 4 (Week 1): pt will ambulate 50 feet with LRAD and +2 A  Skilled Therapeutic Interventions/Progress Updates:      Pt supine in bed upon arrival. Pt agreeable to therapy. Pt denies any pain.   Discussed what happened this morning. Pt reports he slipped forward out of chair. Pt reports he was trying to scoot backwards and the chair rolled out from underneath him. Of note upon discussion with IDT: pt appears to demo impaired recollection of exact mechanism for fall out of chair, as story inconsistent among disciplines. Discussed with IDT to place pt in recliner, or bed between sessions for pt safety.   Pt son present during session, recommended installing ramp for 1 step for home entry. Pt and son report there is currently a metal temporary ramp from porch to door, and they will purchase one from ground to porch.   Pt performed supine to sit with mod A for L LE and trunk with HOB elevated, verbal cues provided for technique.   Pt performed squat pivot transfer hospital bed to [facility] with +2 max A, with +2 to stabilize WC only, max verabl cues provided for technique. Pt demos poor recall from squat pivot transfer yesterday.   Pt self propelled WC with R UE/R LE and supervision, pt demos improved recall of technique from yesterday.   Pt ambulated 1x5', 1x10 ', 1x15' with +2 total A for with R UE on handrail, and therapist providing total A for L LE stance and swing phase and postural control 2/2  heavy lateral weight shift to L, max verbal,tactile  visual cues provided for R anterolateral weight shift to R LE. Pt demos intermittent quad activation in stance, and L LE hip and knee activation for L LE advancement however inconsistent 2/2 poor motor planning/coordination.   Pt performed sit to stand with R UE support on handrail +1 max A progressing to heavy mod, throughout session, ,max verbal cues provided for B LE positioning and anterior weight shift for power up and lateral weight shift to R once standing.   Pt performed squat pivot transfer WC to bed with +1 mod A WC to bed, and sit to supine with max A.   Pt supine in bed with all needs within reach and bed alarm on.   Therapy Documentation Precautions:  Precautions Precautions: Fall Precaution Comments: L-hemi, L-inattention, pusher syndrome, Loop recorder placed 2/7 Restrictions Weight Bearing Restrictions Per Provider Order: No  Therapy/Group: Individual Therapy  [facility] Ambrose Finland, [place], DPT  02/23/2023, 3:30 PM

## 2023-02-23 NOTE — Progress Notes (Signed)
 Inpatient Rehabilitation Center Individual Statement of Services  Patient Name:  Edward Rodgers  Date:  02/23/2023  Welcome to the Inpatient Rehabilitation Center.  Our goal is to provide you with an individualized program based on your diagnosis and situation, designed to meet your specific needs.  With this comprehensive rehabilitation program, you will be expected to participate in at least 3 hours of rehabilitation therapies Monday-Friday, with modified therapy programming on the weekends.  Your rehabilitation program will include the following services:  Physical Therapy (PT), Occupational Therapy (OT), Speech Therapy (ST), 24 hour per day rehabilitation nursing, Therapeutic Recreaction (TR), Neuropsychology, Care Coordinator, Rehabilitation Medicine, Nutrition Services, and Pharmacy Services  Weekly team conferences will be held on Wednesday to discuss your progress.  Your Inpatient Rehabilitation Care Coordinator will talk with you frequently to get your input and to update you on team discussions.  Team conferences with you and your family in attendance may also be held.  Expected length of stay: 17-21 days  Overall anticipated outcome: overall min assist  Depending on your progress and recovery, your program may change. Your Inpatient Rehabilitation Care Coordinator will coordinate services and will keep you informed of any changes. Your Inpatient Rehabilitation Care Coordinator's name and contact numbers are listed  below.  The following services may also be recommended but are not provided by the Inpatient Rehabilitation Center:  Driving Evaluations Home Health Rehabiltiation Services Outpatient Rehabilitation Services    Arrangements will be made to provide these services after discharge if needed.  Arrangements include referral to agencies that provide these services.  Your insurance has been verified to be:  [facility] medicare Your primary doctor is:  None  Pertinent information  will be shared with your doctor and your insurance company.  Inpatient Rehabilitation Care Coordinator:  Adrianna Albee, [place] [phone] or Justine Oms  Information discussed with and copy given to patient by: Mardell Shade, 02/23/2023, 8:52 AM

## 2023-02-23 NOTE — Progress Notes (Addendum)
 Rawland Caddy, MD  Physician Physical Medicine and Rehabilitation   PMR Pre-admission    Signed   Date of Service: 02/20/2023  2:42 PM  Related encounter: ED to Hosp-Admission (Discharged) from 02/14/2023 in [facility]   Signed     Expand All Collapse All  Show:Clear all [x] Written[x] Templated[x] Copied  Added by: [x] Dorenda Gandy, RN[x] Conetta, Kristyn H  [] Hover for details PMR Admission Coordinator Pre-Admission Assessment   Patient: Edward Rodgers is an 73 y.o., male MRN: [id number] DOB: Mar 10, 1950 Height: 5\' 9"  (175.3 cm) Weight: 101.2 kg                                                                                                                                                  Insurance Information HMO: yes    PPO:      PCP:      IPA:      80/20:      OTHER:    PRIMARY: U[employer] Medicare      Policy#: [id number]   ; Medicare # [id number]   Subscriber: patient CM Name: Edward Rodgers      Phone#: [phone] option 3     Fax#: [phone] Pre-Cert#: [id number] approved 02/21/23 until 02/28/23       Employer:  Benefits:  Phone #: [phone]     Name: 2/3 Eff. Date: 01/14/23     Deduct: none      Out of Pocket Max: $3600      Life Max: none  CIR: $300 co pay per day days 1 until 5      SNF: no co pay per day days 1 until 20; $203 co pay per day days 21 until 100 Outpatient: $20 co pay per visit     Co-Pay: visits per medical neccesity Home Health: 100%      Co-Pay: visits per medical neccesity DME: 80%     Co-Pay: 20% Providers: in network  SECONDARY:       Policy#:       Phone#:    Artist:       Phone#:    The Data processing manager" for patients in Inpatient Rehabilitation Facilities with attached "Privacy Act Statement-Health Care Records" was provided and verbally reviewed with: Patient   Emergency Contact Information Contact Information       Name Relation Home Work Mobile    EdwardChristine Spouse [phone]    [phone]    Edward Rodgers     [phone]         Other Contacts       Name Relation Home Work Mobile    Edward Rodgers     [phone]         Current Medical History  Patient Admitting Diagnosis: CVA   History of Present Illness: 73 year old right-handed male with history of diastolic congestive heart  failure, gout, hypothyroidism, prior cerebellar infarct with residual diplopia maintained on full-strength aspirin , prostate cancer, tobacco use.  Presented to 02/02/2023 with left-sided weakness numbness and disconjugate gaze.    CT/MRI showed diffusion hyperintensity in the right PCA distribution affecting the medial temporal lobe and thalamus.  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  CTA right P2 occlusion presumably acute in the setting.  Extensive intracranial atherosclerosis.  CT perfusion of the brain showed increased ischemic range perfusion on the right PCA distribution measuring 36 cc as compared to 25 cc on prior imaging.  Admission chemistries unremarkable except glucose 115 BUN 24, hemoglobin A1c 6.4.  Echocardiogram with ejection fraction of 55% no wall motion abnormalities grade 1 diastolic dysfunction.  Patient did receive TNK.  MRI follow-up revealed progressive acute right PCA territory infarct with extension most notable in the right periatrial white matter and occipital lobe.  There was suspected subcentimeter hemorrhage centrally in the right thalamic infarct.  New punctate infarct in the left occipital lobe also seen.  Neurology follow-up placed on low-dose aspirin  and Plavix  for CVA prophylaxis x 3 weeks then Plavix  alone.  Subcutaneous heparin  for DVT prophylaxis.  Await plan for loop recorder on 02/20/23.  Mild AKI 1.20-1.44 placed on gentle IV fluids since discontinued with latest creatinine 1.23.    Complete NIHSS TOTAL: 13 Glasgow Coma Scale Score: 15   Patient's medical record from [facility] has been  reviewed by the rehabilitation admission coordinator and physician.   Past Medical History      Past Medical History:  Diagnosis Date   Cancer (HCC) 2002    prostate   CHF (congestive heart failure) (HCC)     Gout     Hypertension     Thyroid  disease          Has the patient had major surgery during 100 days prior to admission? No   Family History  family history is not on file.   Current Medications   Current Medications    Current Facility-Administered Medications:    acetaminophen  (TYLENOL ) tablet 650 mg, 650 mg, Oral, Q4H PRN, 650 mg at 02/20/23 0948 **OR** acetaminophen  (TYLENOL ) 160 MG/5ML solution 650 mg, 650 mg, Per Tube, Q4H PRN **OR** acetaminophen  (TYLENOL ) suppository 650 mg, 650 mg, Rectal, Q4H PRN, Khaliqdina, Salman, MD   allopurinol  (ZYLOPRIM ) tablet 300 mg, 300 mg, Oral, q morning, Consuelo Denmark, MD, 300 mg at 02/20/23 0454   amLODipine  (NORVASC ) tablet 10 mg, 10 mg, Oral, Daily, Consuelo Denmark, MD, 10 mg at 02/20/23 0949   aspirin  EC tablet 81 mg, 81 mg, Oral, Daily, Consuelo Denmark, MD, 81 mg at 02/20/23 0948   atorvastatin  (LIPITOR ) tablet 80 mg, 80 mg, Oral, Daily, Lehner, Erin C, NP, 80 mg at 02/20/23 0981   calcium -vitamin D (OSCAL WITH D) 500-5 MG-MCG per tablet 1 tablet, 1 tablet, Oral, Daily, Khaliqdina, Salman, MD, 1 tablet at 02/20/23 0948   Chlorhexidine  Gluconate Cloth 2 % PADS 6 each, 6 each, Topical, Daily, Khaliqdina, Salman, MD, 6 each at 02/19/23 0931   clopidogrel  (PLAVIX ) tablet 75 mg, 75 mg, Oral, Daily, Consuelo Denmark, MD, 75 mg at 02/20/23 0949   feeding supplement (ENSURE ENLIVE / ENSURE PLUS) liquid 237 mL, 237 mL, Oral, BID BM, Shafer, Devon, NP, 237 mL at 02/20/23 0952   heparin  injection 5,000 Units, 5,000 Units, Subcutaneous, Q8H, Consuelo Denmark, MD, 5,000 Units at 02/20/23 0510   labetalol  (NORMODYNE ) injection 10 mg, 10 mg, Intravenous, Q1H PRN, Audrene Lease, NP, 10  mg at 02/16/23 1216   latanoprost  (XALATAN ) 0.005 % ophthalmic solution 1 drop, 1  drop, Right Eye, QHS, Khaliqdina, Salman, MD, 1 drop at 02/19/23 2139   levothyroxine  (SYNTHROID ) tablet 88 mcg, 88 mcg, Oral, QAC breakfast, Tucker, Mary-Ashlyn, RPH, 88 mcg at 02/20/23 0510   lidocaine  (LIDODERM ) 5 % 1 patch, 1 patch, Transdermal, Q24H, Shafer, Devon, NP, 1 patch at 02/20/23 1248   metoprolol  succinate (TOPROL -XL) 24 hr tablet 100 mg, 100 mg, Oral, Daily, Consuelo Denmark, MD, 100 mg at 02/20/23 0623   mirabegron  ER (MYRBETRIQ ) tablet 50 mg, 50 mg, Oral, Daily, Consuelo Denmark, MD, 50 mg at 02/20/23 7628   ondansetron  (ZOFRAN -ODT) disintegrating tablet 4 mg, 4 mg, Oral, Q8H PRN, de Thayne Fine, Cortney E, NP, 4 mg at 02/19/23 0547   Oral care mouth rinse, 15 mL, Mouth Rinse, PRN, Khaliqdina, Salman, MD   polyethylene glycol (MIRALAX  / GLYCOLAX ) packet 17 g, 17 g, Oral, Daily, Consuelo Denmark, MD, 17 g at 02/20/23 3151   QUEtiapine  (SEROQUEL ) tablet 50 mg, 50 mg, Oral, QHS, Consuelo Denmark, MD, 50 mg at 02/19/23 1701   senna-docusate (Senokot-S) tablet 1 tablet, 1 tablet, Oral, QHS PRN, Khaliqdina, Salman, MD, 1 tablet at 02/18/23 0857   sodium chloride  (OCEAN) 0.65 % nasal spray 1 spray, 1 spray, Each Nare, PRN, de Thayne Fine, Cortney E, NP   spironolactone  (ALDACTONE ) tablet 25 mg, 25 mg, Oral, Daily, Consuelo Denmark, MD, 25 mg at 02/20/23 7616   venlafaxine  XR (EFFEXOR -XR) 24 hr capsule 150 mg, 150 mg, Oral, QPM, Consuelo Denmark, MD, 150 mg at 02/19/23 1701     Patients Current Diet:  Diet Order                  Diet regular Room service appropriate? Yes; Fluid consistency: Thin  Diet effective now                       Precautions / Restrictions Precautions Precautions: Fall Precaution Comments: pusher L; glasses, some L lower field cut (pt endorses double vision even prior to CVA) Restrictions Weight Bearing Restrictions Per Provider Order: No    Has the patient had 2 or more falls or a fall with injury in the past year?No   Prior Activity Level Community (5-7x/wk): Mod I with use of  cane prn   Prior Functional Level Prior Function Prior Level of Function : Independent/Modified Independent Mobility Comments: usses cane when he thinks about it ADLs Comments: Pt reports mod I for ADL and medication management.   Self Care: Did the patient need help bathing, dressing, using the toilet or eating?  Independent   Indoor Mobility: Did the patient need assistance with walking from room to room (with or without device)? Independent   Stairs: Did the patient need assistance with internal or external stairs (with or without device)? Independent   Functional Cognition: Did the patient need help planning regular tasks such as shopping or remembering to take medications? Independent   Patient Information Are you of Hispanic, Latino/a,or Spanish origin?: A. No, not of Hispanic, Latino/a, or Spanish origin What is your race?: B. Black or African American Do you need or want an interpreter to communicate with a doctor or health care staff?: 0. No   Patient's Response To:  Health Literacy and Transportation Is the patient able to respond to health literacy and transportation needs?: Yes Health Literacy - How often do you need to have someone help you when you read instructions, pamphlets,  or other written material from your doctor or pharmacy?: Never In the past 12 months, has lack of transportation kept you from medical appointments or from getting medications?: No In the past 12 months, has lack of transportation kept you from meetings, work, or from getting things needed for daily living?: No   Journalist, newspaper / Equipment Home Equipment: Shower seat, Medical laboratory scientific officer - quad, Hand held shower head   Prior Device Use: Indicate devices/aids used by the patient prior to current illness, exacerbation or injury? None of the above   Current Functional Level Cognition   Arousal/Alertness: Lethargic Overall Cognitive Status: Impaired/Different from baseline Current Attention Level:  Sustained Orientation Level: Oriented to person, Oriented to place, Oriented to situation Safety/Judgement: Decreased awareness of deficits, Decreased awareness of safety General Comments: Patient able to follow one step commands Attention: Sustained, Focused Focused Attention: Appears intact Sustained Attention: Impaired Sustained Attention Impairment: Verbal complex Memory: Impaired Memory Impairment: Decreased short term memory Decreased Short Term Memory: Verbal basic Awareness: Impaired Awareness Impairment: Emergent impairment Problem Solving: Impaired Problem Solving Impairment: Verbal complex Executive Function: Organizing Organizing: Impaired Organizing Impairment: Functional complex Safety/Judgment: Appears intact    Extremity Assessment (includes Sensation/Coordination)   Upper Extremity Assessment: LUE deficits/detail LUE Deficits / Details: LUE with min increased tone in supine, able to initate movement with AAROM. Performed AAROM to 90 in supine x3 with no complaints of pain.  Patient has gross grassp intermittent with control and no extension noted.  Tone increased to moderate in sitting with pull into shoulder  internal rotation LUE Coordination: decreased fine motor, decreased gross motor  Lower Extremity Assessment: Defer to PT evaluation LLE Deficits / Details: unable to activate without assist; flexion with stretch to extensors and pt able to extend the leg; no active ankle movement, noted some numbness compared to R LLE Sensation: decreased light touch, decreased proprioception LLE Coordination: decreased gross motor     ADLs   Overall ADL's : Needs assistance/impaired Eating/Feeding: Moderate assistance, Sitting Grooming: Sitting, Maximal assistance Upper Body Bathing: Maximal assistance, Sitting Lower Body Bathing: Total assistance, +2 for physical assistance, +2 for safety/equipment, Sit to/from stand, Sitting/lateral leans Upper Body Dressing : Maximal  assistance, Sitting Lower Body Dressing: Total assistance, +2 for safety/equipment, +2 for physical assistance Toilet Transfer: Maximal assistance, +2 for physical assistance, +2 for safety/equipment, Stand-pivot Functional mobility during ADLs: +2 for physical assistance, Maximal assistance (Used steady for transfer from bed to chair) General ADL Comments: limited by attention, vision, L hemi     Mobility   Overal bed mobility: Needs Assistance Bed Mobility: Supine to Sit Rolling: Max assist (to pts left) Sidelying to sit: Max assist Supine to sit: Mod assist Sit to supine: Max assist General bed mobility comments: assist for trunk elevation, LLE progression to/from EOB, bringing LUE into lap when moving to EOB, scooting to EOB with assist of bed pad.     Transfers   Overall transfer level: Needs assistance Equipment used: 1 person hand held assist Transfers: Sit to/from Stand, Bed to chair/wheelchair/BSC Sit to Stand: Max assist, From elevated surface Bed to/from chair/wheelchair/BSC transfer type:: Squat pivot Stand pivot transfers: Max assist Squat pivot transfers: Max assist Transfer via Lift Equipment: [employer] transfer comment: max A to stand  with cues to shift weight to R, max A to boost with step by step cues to sequence to pivot toward R to chair     Ambulation / Gait / Stairs / Wheelchair Mobility   Ambulation/Gait General Gait Details: unable  Posture / Balance Dynamic Sitting Balance Sitting balance - Comments: initially poor, but improving to sitting unsupported with feedback of mirror to correct posture to upright. mod cuing for placing LUE in lap to prevent pushing Balance Overall balance assessment: Needs assistance Sitting-balance support: Feet supported, Bilateral upper extremity supported Sitting balance-Leahy Scale: Poor Sitting balance - Comments: initially poor, but improving to sitting unsupported with feedback of mirror to correct posture to  upright. mod cuing for placing LUE in lap to prevent pushing Postural control: Left lateral lean Standing balance support: Bilateral upper extremity supported Standing balance-Leahy Scale: Zero Standing balance comment: max A     Special needs/care consideration Hgb A1c 6.4 LOOP to be placed prior to admit to CIR Urinary incontinence at baseline s/p prostate cancer/Wears Depends      Previous Home Environment  Living Arrangements: Spouse/significant other  Lives With: Spouse Available Help at Discharge: Family, Available PRN/intermittently Type of Home: House Home Layout: Two level, Able to live on main level with bedroom/bathroom, Laundry or work area in basement Alternate Level Stairs-Rails: Right Alternate Level Stairs-Number of Steps: wood stove in basement Home Access: Stairs to enter Entrance Stairs-Rails: None Secretary/administrator of Steps: 1 Bathroom Shower/Tub: Associate Professor: Yes How Accessible: Accessible via walker   Discharge Living Setting Plans for Discharge Living Setting: Patient's home, Lives with (comment) (spouse) Type of Home at Discharge: House Discharge Home Layout: Able to live on main level with bedroom/bathroom, Laundry or work area in basement Discharge Home Access: Stairs to enter Secretary/administrator of Steps: 1 Discharge Bathroom Shower/Tub: Tub/shower unit Discharge Bathroom Toilet: Standard Discharge Bathroom Accessibility: Yes How Accessible: Accessible via walker Does the patient have any problems obtaining your medications?: No   Social/Family/Support Systems Anticipated Caregiver: Spouse, son Wauneta Haddock Anticipated Caregiver's Contact Information: Wauneta Haddock [phone], wife [phone] Caregiver Availability: 24/7 Discharge Plan Discussed with Primary Caregiver: Yes Is Caregiver In Agreement with Plan?: Yes Does Caregiver/Family have Issues with Lodging/Transportation while Pt is in  Rehab?: No   Wife is a CNA and will take FMLA to provide his care as needed. 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   Goals Patient/Family Goal for Rehab: min A PT, OT, independent with SLP Expected length of stay: ELOS 21 to 25 days Pt/Family Agrees to Admission and willing to participate: Yes Program Orientation Provided & Reviewed with Pt/Caregiver Including Roles  & Responsibilities: Yes  Barriers to Discharge: Insurance for SNF coverage   Decrease burden of Care through IP rehab admission: n/a   Possible need for SNF placement upon discharge:not anticipated   Patient Condition: This patient's medical and functional status are the same  as the consult dated 02/17/23 in which the Rehabilitation Physician determined and documented that the patient was potentially appropriate for intensive rehabilitative care in an inpatient rehabilitation facility.  Will admit to inpatient rehab on 02/21/23 when bed is available.    Preadmission Screen Completed By:  Tanya Fantasia, RN MSN, 02/20/2023 2:47 PM ______________________________________________________________________   Discussed status with Dr. Rachel Budds on 02/20/23 at 0900 and received approval for admission 02/21/23 when bed is available.   Admission Coordinator:  Tanya Fantasia, RN MSN time [id number] Date 02/20/23            Revision History  Routing History

## 2023-02-23 NOTE — Discharge Instructions (Addendum)
 Inpatient Rehab Discharge Instructions  Edward Rodgers Discharge date and time: 03/28/2023  Activities/Precautions/ Functional Status: Activity: no lifting, driving, or strenuous exercise until cleared by MD Diet: cardiac diet Wound Care: none needed Functional status:  ___ No restrictions     ___ Walk up steps independently __x_ 24/7 supervision/assistance   ___ Walk up steps with assistance ___ Intermittent supervision/assistance  ___ Bathe/dress independently ___ Walk with walker     ___ Bathe/dress with assistance ___ Walk Independently    ___ Shower independently ___ Walk with assistance    __x_ Shower with assistance _x__ No alcohol     ___ Return to work/school ________  Special Instructions: No driving, alcohol consumption or tobacco use.  Recommend daily BP measurement in same arm and record time of day. Bring this information with you to follow-up appointment with PCP.   COMMUNITY REFERRALS UPON DISCHARGE:    Home Health:   PT  OT    RN      COULD NOT FIND SPEECH TO SERVICE [place]-PT AND FAMILY AWARE                 Agency:[facility] Phone: [phone]    Medical Equipment/Items Ordered:WHEELCHAIR-NU MOTION  [phone]                                                  3 IN 1-[facility]   [phone] [facility] BED WILL WAIT [facility] AND DECIDE. BROTHER BOUGHT A STEDY FOR HOME   STROKE/TIA DISCHARGE INSTRUCTIONS SMOKING Cigarette smoking nearly doubles your risk of having a stroke & is the single most alterable risk factor  If you smoke or have smoked in the last 12 months, you are advised to quit smoking for your health. Most of the excess cardiovascular risk related to smoking disappears within a year of stopping. Ask you doctor about anti-smoking medications [place] Quit Line: 1-800-QUIT NOW Free Smoking Cessation Classes [phone]  CHOLESTEROL Know your levels; limit fat & cholesterol in your diet  Lipid Panel     Component  Value Date/Time   CHOL 96 02/15/2023 0501   TRIG 146 02/15/2023 0501   HDL 37 (L) 02/15/2023 0501   CHOLHDL 2.6 02/15/2023 0501   VLDL 29 02/15/2023 0501   LDLCALC 30 02/15/2023 0501     Many patients benefit from treatment even if their cholesterol is at goal. Goal: Total Cholesterol (CHOL) less than 160 Goal:  Triglycerides (TRIG) less than 150 Goal:  HDL greater than 40 Goal:  LDL (LDLCALC) less than 100   BLOOD PRESSURE American Stroke Association blood pressure target is less that 120/80 mm/Hg  Your discharge blood pressure is:  BP: (!) 154/92 Monitor your blood pressure Limit your salt and alcohol intake Many individuals will require more than one medication for high blood pressure  DIABETES (A1c is a blood sugar average for last 3 months) Goal HGBA1c is under 7% (HBGA1c is blood sugar average for last 3 months)  Diabetes: Diagnosis of diabetes:  Your A1c:6.4 %    Lab Results  Component Value Date   HGBA1C 6.4 (H) 02/15/2023    Your HGBA1c can be lowered with medications, healthy diet, and exercise. Check your blood sugar as directed by your physician Call your physician if you experience unexplained or low blood sugars.  PHYSICAL ACTIVITY/REHABILITATION Goal is 30  minutes at least 4 days per week  Activity: Increase activity slowly, Therapies: Physical Therapy: Home Health, Occupational Therapy: Home Health, and Speech Therapy: Home Health Return to work: when cleared by MD Activity decreases your risk of heart attack and stroke and makes your heart stronger.  It helps control your weight and blood pressure; helps you relax and can improve your mood. Participate in a regular exercise program. Talk with your doctor about the best form of exercise for you (dancing, walking, swimming, cycling).  DIET/WEIGHT Goal is to maintain a healthy weight  Your discharge diet is:  Diet Order             Diet regular Room service appropriate? Yes; Fluid consistency: Thin  Diet  effective now                  thin liquids Your height is:  Height: 5\' 9"  (175.3 cm) Your current weight is: Weight: 86.9 kg Your Body Mass Index (BMI) is:  BMI (Calculated): 29.68 Following the type of diet specifically designed for you will help prevent another stroke. Your goal weight range is:   Your goal Body Mass Index (BMI) is 19-24. Healthy food habits can help reduce 3 risk factors for stroke:  High cholesterol, hypertension, and excess weight.  RESOURCES Stroke/Support Group:  Call [phone]   STROKE EDUCATION PROVIDED/REVIEWED AND GIVEN TO PATIENT Stroke warning signs and symptoms How to activate emergency medical system (call 911). Medications prescribed at discharge. Need for follow-up after discharge. Personal risk factors for stroke. Pneumonia vaccine given: No Flu vaccine given: No My questions have been answered, the writing is legible, and I understand these instructions.  I will adhere to these goals & educational materials that have been provided to me after my discharge from the hospital.     My questions have been answered and I understand these instructions. I will adhere to these goals and the provided educational materials after my discharge from the hospital.  Patient/Caregiver Signature _______________________________ Date __________  Clinician Signature _______________________________________ Date __________  Please bring this form and your medication list with you to all your follow-up doctor's appointments.   Information on my medicine - ELIQUIS (apixaban)  This medication education was reviewed with me or my healthcare representative as part of my discharge preparation.   Why was Eliquis prescribed for you? Eliquis was prescribed to treat blood clots that may have been found in the veins of your legs (deep vein thrombosis) or in your lungs (pulmonary embolism) and to reduce the risk of them occurring again.  What do You need to know  about Eliquis ? The dose is ONE 5 mg tablet taken TWICE daily.  Eliquis may be taken with or without food.   Try to take the dose about the same time in the morning and in the evening. If you have difficulty swallowing the tablet whole please discuss with your pharmacist how to take the medication safely.  Take Eliquis exactly as prescribed and DO NOT stop taking Eliquis without talking to the doctor who prescribed the medication.  Stopping may increase your risk of developing a new blood clot.  Refill your prescription before you run out.  After discharge, you should have regular check-up appointments with your healthcare provider that is prescribing your Eliquis.    What do you do if you miss a dose? If a dose of ELIQUIS is not taken at the scheduled time, take it as soon as possible on the same day and  twice-daily administration should be resumed. The dose should not be doubled to make up for a missed dose.  Important Safety Information A possible side effect of Eliquis is bleeding. You should call your healthcare provider right away if you experience any of the following: Bleeding from an injury or your nose that does not stop. Unusual colored urine (red or dark [place]) or unusual colored stools (red or black). Unusual bruising for unknown reasons. A serious fall or if you hit your head (even if there is no bleeding).  Some medicines may interact with Eliquis and might increase your risk of bleeding or clotting while on Eliquis. To help avoid this, consult your healthcare provider or pharmacist prior to using any new prescription or non-prescription medications, including herbals, vitamins, non-steroidal anti-inflammatory drugs (NSAIDs) and supplements.  This website has more information on Eliquis (apixaban): http://[email or web address].com/eliquis/home

## 2023-02-23 NOTE — Progress Notes (Signed)
 Inpatient Rehabilitation Care Coordinator Assessment and Plan Patient Details  Name: Edward Rodgers MRN: [id number] Date of Birth: 1950-06-27  Today's Date: 02/23/2023  Hospital Problems: Principal Problem:   Acute right PCA stroke [facility])  Past Medical History:  Past Medical History:  Diagnosis Date   Cancer (HCC) 2002   prostate   CHF (congestive heart failure) (HCC)    Gout    Hypertension    Thyroid  disease    Past Surgical History:  Past Surgical History:  Procedure Laterality Date   COLONOSCOPY N/A 07/15/2017   Procedure: COLONOSCOPY;  Surgeon: Ruby Corporal, MD;  Location: [facility] ENDO SUITE;  Service: Endoscopy;  Laterality: N/A;  830   IR CT HEAD LTD  02/14/2023   IR PERCUTANEOUS ART THROMBECTOMY/INFUSION INTRACRANIAL INC DIAG ANGIO  02/14/2023   LOOP RECORDER INSERTION N/A 02/20/2023   Procedure: LOOP RECORDER INSERTION;  Surgeon: Verona Goodwill, MD;  Location: [facility] INVASIVE CV LAB;  Service: Cardiovascular;  Laterality: N/A;   POLYPECTOMY  07/15/2017   Procedure: POLYPECTOMY;  Surgeon: Ruby Corporal, MD;  Location: [facility] ENDO SUITE;  Service: Endoscopy;;  colon   PROSTATE SURGERY     RADIOLOGY WITH ANESTHESIA Left 02/14/2023   Procedure: IR WITH ANESTHESIA;  Surgeon: Radiologist, Medication, MD;  Location: [facility] OR;  Service: Radiology;  Laterality: Left;   RADIOLOGY WITH ANESTHESIA N/A 02/14/2023   Procedure: IR WITH ANESTHESIA;  Surgeon: Radiologist, Medication, MD;  Location: [facility] OR;  Service: Radiology;  Laterality: N/A;   Social History:  reports that he has quit smoking. He has never used smokeless tobacco. He reports that he does not drink alcohol and does not use drugs.  Family / Support Systems Marital Status: Married Patient Roles: Spouse, Parent, Other (Comment) (pastor) Spouse/Significant Other: [phone] Children: Garry [place] [phone] Other Supports: Kendra-granddaughter [phone] Anticipated Caregiver: Wife Ability/Limitations of Caregiver: wife plans to take  FMLA to provide assist-son lives in [place] and works Engineer, structural Availability: 24/7 (for short time) Family Dynamics: Close with wife, family and church members. he has many visitors and feels they give him strength. He has had a CVA in the past and did well and hopeful he will again.  Social History Preferred language: English Religion: Baptist Cultural Background: No issues Education: Dentist - How often do you need to have someone help you when you read instructions, pamphlets, or other written material from your doctor or pharmacy?: Never Writes: Yes Employment Status: Retired Marine scientist Issues: No issues Guardian/Conservator: NA-MD feels pt is capable of making his own decisions while here   Abuse/Neglect Abuse/Neglect Assessment Can Be Completed: Yes Physical Abuse: Denies Verbal Abuse: Denies Sexual Abuse: Denies Exploitation of patient/patient's resources: Denies Self-Neglect: Denies  Patient response to: Social Isolation - How often do you feel lonely or isolated from those around you?: Never  Emotional Status Pt's affect, behavior and adjustment status: Pt is motivated to do well and recover as he did last time. He feels he is getting movement back and hopeful he will do well here. Wife does work but plans to take off to assist Recent Psychosocial Issues: other health issues Psychiatric History: No history-issues may benefit from seeing neuro-psych while here Substance Abuse History: NA  Patient / Family Perceptions, Expectations & Goals Pt/Family understanding of illness & functional limitations: Pt is able to explain his stroke and deficits., he asked appropriate questions and feels understands his treatment plan moving forward Premorbid pt/family roles/activities: Geralynn Knife, retiree, father, grandfather, husband, etc Anticipated changes in roles/activities/participation: resume Pt/family  expectations/goals: Pt states: " I hope to get back to  what I was doing before."  [employer]: None Premorbid Home Care/DME Agencies: Other (Comment) (grab bars, tub seat, cane) Transportation available at discharge: self and wife Is the patient able to respond to transportation needs?: Yes In the past 12 months, has lack of transportation kept you from medical appointments or from getting medications?: No In the past 12 months, has lack of transportation kept you from meetings, work, or from getting things needed for daily living?: No Resource referrals recommended: Neuropsychology  Discharge Planning Living Arrangements: Spouse/significant other Support Systems: Spouse/significant other, Children, Other relatives, Friends/neighbors, Psychologist, clinical community Type of Residence: Private residence Insurance Resources: Media planner (specify) Investment banker, operational) Financial Resources: Tree surgeon, Family Support Financial Screen Referred: No Living Expenses: Own Money Management: Patient, Spouse Does the patient have any problems obtaining your medications?: No Home Management: both pt and wife Patient/Family Preliminary Plans: Return home with wife who plans to take FMLA from work to provide assist. His goals are min level and he will need assist at DC. Will update once team conference on Wednesday Care Coordinator Barriers to Discharge: Insurance for SNF coverage, Lack of/limited family support Care Coordinator Anticipated Follow Up Needs: HH/OP  Clinical Impression Pleasant gentleman who is motivated to recover and like he has done in the past. Wife to take FMLA to provide assist. Will discuss working on PCP for pt at discharge.  Mardell Shade 02/23/2023, 9:31 AM

## 2023-02-23 NOTE — Plan of Care (Signed)
  Problem: RH Ambulation Goal: LTG Patient will ambulate in controlled environment (PT) Description: LTG: Patient will ambulate in a controlled environment, # of feet with assistance (PT). Flowsheets (Taken 02/23/2023 1240) LTG: Ambulation distance in controlled environment: 75 feet with LRAD Goal: LTG Patient will ambulate in home environment (PT) Description: LTG: Patient will ambulate in home environment, # of feet with assistance (PT). Flowsheets (Taken 02/23/2023 1240) LTG: Ambulation distance in home environment: 10 feet with LRAD

## 2023-02-23 NOTE — Progress Notes (Addendum)
 Assisted with patient transfer to toilet , and this nurse noticed small blood smear on patients brief. After patient voided nurse assessed where blood was coming from and seen it was coming from head of penis. Notified Adell Hones PA and new orders for a UA ordered .    Tobey Forte , LPN  Patient continent of urine, denying pain. No in and out cathing. Afebrile. CBC this morning without elevated WBCs, H and H stable. On Plavix , aspirin  and heparin  Bergoo for DVT prophy

## 2023-02-23 NOTE — Progress Notes (Signed)
 Patient ID: Edward Rodgers, male   DOB: 04/02/1950, 73 y.o.   MRN: [id number] Met with the patient and brother to review current medical situation, rehab process, team conference and plan of care. Discussed secondary risk management including HTN, HLD, HF and prediabetes. Reviewed medications including DAPT x 3 weeks then Plavix  solo and dietary modifications for HH/CMM diet. Reviewed dietary modification for low purine diet for gout and medications in use.  Discussed skin care and safety with loop recorder care. Active My chart account; brother assisting to access. Continue to follow along to address educational needs to facilitate preparation for discharge. Naoma Bacca, RN

## 2023-02-23 NOTE — Progress Notes (Signed)
 Edward Caddy, MD  Physician Physical Medicine and Rehabilitation   Consult Note    Signed   Date of Service: 02/16/2023  1:22 PM  Related encounter: ED to Hosp-Admission (Discharged) from 02/14/2023 in [facility]   Signed     Expand All Collapse All  Show:Clear all [x] Written[x] Templated[] Copied  Added by: [x] Edward Caddy, MD  [] Hover for details          Physical Medicine and Rehabilitation Consult Reason for Consult: Impaired functional mobility after stroke Referring Physician: Christiane Cowing     HPI: PRINTICE RESENDIS is a 73 y.o. male with history significant for hypertension and prior cerebellar stroke who presented on 02/14/2023 with acute left-sided weakness, numbness and disconjugate gaze with right gaze preference.  CT of the head was negative.  CTA showed right P2 occlusion with 25 cc penumbra in the right occipital lobe.  Patient improved on the way to the radiology suite so intervention was ultimately held.  She was given TNKase .  MRI of the brain initially demonstrated diffusion hyperintensity in the right medial temporal lobe and thalamus.  Follow-up MRI of the next day revealed progressive acute right PCA territory infarct with extension most notable in the right periatrial white matter and occipital lobe.  There is a suspected subcentimeter hemorrhage centrally in the right thalamic infarct.  New punctate acute infarcts in the left occipital lobe were also seen.  Patient was placed on Plavix  in addition to his aspirin .  Stroke is concerning for cardioembolic event and loop recorder is being considered.  Patient with hypertension.  Home medications were resumed and amlodipine  was added.  Patient is also dealt with acute kidney injury and delirium.  Patient was up with therapy yesterday and a max assist for sit to stand transfers.  He was unable to walk on this initial evaluation.  Patient lives with his spouse and I will two-level home with one-step to  enter.  They are able to live on the first floor.     Review of Systems  Constitutional: Negative.   HENT: Negative.    Eyes:  Positive for blurred vision and double vision.  Respiratory: Negative.    Cardiovascular: Negative.   Gastrointestinal: Negative.   Genitourinary: Negative.   Skin: Negative.   Neurological:  Positive for dizziness, sensory change and focal weakness.  Psychiatric/Behavioral: Negative.          Past Medical History:  Diagnosis Date   Cancer (HCC) 2002    prostate   CHF (congestive heart failure) (HCC)     Gout     Hypertension     Thyroid  disease               Past Surgical History:  Procedure Laterality Date   COLONOSCOPY N/A 07/15/2017    Procedure: COLONOSCOPY;  Surgeon: Ruby Corporal, MD;  Location: [facility];  Service: Endoscopy;  Laterality: N/A;  830   POLYPECTOMY   07/15/2017    Procedure: POLYPECTOMY;  Surgeon: Ruby Corporal, MD;  Location: [facility];  Service: Endoscopy;;  colon   PROSTATE SURGERY       RADIOLOGY WITH ANESTHESIA Left 02/14/2023    Procedure: IR WITH ANESTHESIA;  Surgeon: Radiologist, Medication, MD;  Location: [facility] OR;  Service: Radiology;  Laterality: Left;   RADIOLOGY WITH ANESTHESIA N/A 02/14/2023    Procedure: IR WITH ANESTHESIA;  Surgeon: Radiologist, Medication, MD;  Location: [facility] OR;  Service: Radiology;  Laterality: N/A;  No family history on file.     Social History:  reports that he has quit smoking. He has never used smokeless tobacco. He reports that he does not drink alcohol and does not use drugs. Allergies:  Allergies  No Known Allergies         Medications Prior to Admission  Medication Sig Dispense Refill   alendronate (FOSAMAX) 70 MG tablet Take 70 mg by mouth once a week. Take with a full glass of water  on an empty stomach. mondays       allopurinol  (ZYLOPRIM ) 300 MG tablet Take 300 mg by mouth every morning.       aspirin  325 MG tablet Take 1 tablet (325 mg total) by mouth daily. 30  tablet 5   atorvastatin  (LIPITOR ) 80 MG tablet Take 80 mg by mouth daily.       Calcium  Carb-Cholecalciferol  (CALCIUM  600 + D PO) Take 1 tablet by mouth in the morning and at bedtime.       empagliflozin (JARDIANCE) 25 MG TABS tablet Take 12.5 mg by mouth daily.       latanoprost  (XALATAN ) 0.005 % ophthalmic solution Place 1 drop into the right eye at bedtime.       levothyroxine  (SYNTHROID ) 88 MCG tablet Take 88 mcg by mouth daily before breakfast.       meloxicam  (MOBIC ) 7.5 MG tablet Take 7.5 mg by mouth daily.       metoprolol  succinate (TOPROL -XL) 100 MG 24 hr tablet Take 100 mg by mouth daily. Take with or immediately following a meal.       mirabegron  ER (MYRBETRIQ ) 50 MG TB24 tablet Take 50 mg by mouth daily.       spironolactone  (ALDACTONE ) 25 MG tablet Take 25 mg by mouth daily.       venlafaxine  XR (EFFEXOR -XR) 150 MG 24 hr capsule Take 150 mg by mouth every evening.              Home: Home Living Family/patient expects to be discharged to:: Private residence Living Arrangements: Spouse/significant other Available Help at Discharge: Family, Available PRN/intermittently (wife works as Lawyer but per pt broither could come stay with him if needed during the day) Type of Home: House Home Access: Stairs to enter [employer] of Steps: 1 Home Layout: Two level, Able to live on main level with bedroom/bathroom, Laundry or work area in basement Alternate Teacher, music of Steps: wood stove in basement Bathroom Shower/Tub: Engineer, manufacturing systems: Standard Home Equipment: Information systems manager, Medical laboratory scientific officer - quad, Hand held shower head  Lives With: Spouse  Functional History: Prior Function Prior Level of Function : Independent/Modified Independent Mobility Comments: usses cane when he thinks about it ADLs Comments: Pt reports mod I for ADL and medication management. Functional Status:  Mobility: Bed Mobility Overal bed mobility: Needs Assistance Bed Mobility: Sidelying to  Sit, Rolling Rolling: Used rails, Mod assist Sidelying to sit: Used rails, Max assist, +2 for physical assistance General bed mobility comments: assist for legs off EOB and to lift trunk upright; scoot hips to EOB Transfers Overall transfer level: Needs assistance Equipment used: 2 person hand held assist Transfers: Sit to/from Stand, Bed to chair/wheelchair/BSC Sit to Stand: Max assist, +2 physical assistance Bed to/from chair/wheelchair/BSC transfer type:: Stand pivot Stand pivot transfers: Max assist, +2 physical assistance General transfer comment: up to stand with heavy L lean initially, sat back and cues for head over R knee to stand with improved upright posture, then assist and cues for R  lateral weight shift and A to move L foot then pt stepping over with R with support to L. Lowered to edge of chair and assist to scoot back Ambulation/Gait General Gait Details: unable   ADL: ADL Overall ADL's : Needs assistance/impaired Eating/Feeding: Moderate assistance, Sitting Grooming: Sitting, Maximal assistance Upper Body Bathing: Maximal assistance, Sitting Lower Body Bathing: Total assistance, +2 for physical assistance, +2 for safety/equipment, Sit to/from stand, Sitting/lateral leans Upper Body Dressing : Maximal assistance, Sitting Lower Body Dressing: Total assistance, +2 for safety/equipment, +2 for physical assistance Toilet Transfer: Maximal assistance, +2 for physical assistance, +2 for safety/equipment, Stand-pivot Functional mobility during ADLs: Maximal assistance, +2 for physical assistance, +2 for safety/equipment (SPT only) General ADL Comments: limited by attention, vision, L hemi   Cognition: Cognition Overall Cognitive Status: Impaired/Different from baseline Arousal/Alertness: Lethargic Orientation Level: Oriented to person, Disoriented to place, Disoriented to time, Disoriented to situation Year: Other (Comment) (2005) Day of Week: Correct Attention: Sustained,  Focused Focused Attention: Appears intact Sustained Attention: Impaired Sustained Attention Impairment: Verbal complex Memory: Impaired Memory Impairment: Decreased short term memory Decreased Short Term Memory: Verbal basic Awareness: Impaired Awareness Impairment: Emergent impairment Problem Solving: Impaired Problem Solving Impairment: Verbal complex Executive Function: Landscape architect: Impaired Organizing Impairment: Functional complex Safety/Judgment: Appears intact Cognition Arousal: Alert Behavior During Therapy: WFL for tasks assessed/performed Overall Cognitive Status: Impaired/Different from baseline Area of Impairment: Attention, Safety/judgement Current Attention Level: Sustained Safety/Judgement: Decreased awareness of deficits, Decreased awareness of safety General Comments: cues for upright posture, pushes L feeling he is falling R when he is falling L and noted decreased L side awareness   Blood pressure (!) 167/106, pulse 85, temperature 99.5 F (37.5 C), temperature source Axillary, resp. rate (!) 22, height 5\' 9"  (1.753 m), weight 101.2 kg, SpO2 97%. Physical Exam Constitutional:      General: He is not in acute distress.    Appearance: He is obese.  HENT:     Right Ear: External ear normal.     Mouth/Throat:     Mouth: Mucous membranes are moist.  Eyes:     Extraocular Movements: Extraocular movements intact.     Conjunctiva/sclera: Conjunctivae normal.     Pupils: Pupils are equal, round, and reactive to light.  Cardiovascular:     Rate and Rhythm: Normal rate.  Pulmonary:     Effort: Pulmonary effort is normal.  Abdominal:     Palpations: Abdomen is soft.  Musculoskeletal:        General: No swelling or tenderness. Normal range of motion.     Cervical back: Normal range of motion.     Comments: Left heel cord a little tight  Skin:    General: Skin is warm.  Neurological:     Mental Status: He is alert.     Comments: Alert and oriented x  3. Normal insight and awareness. Intact Memory. Mild procesing deficits. Normal language and speech. Cranial nerve exam unremarkable except for mild central VII. MMT: LUE 1 to 1+ delt, bicep, tricep, wrist and HI. LLE 1/5 prox to distal. Sensory 1/2 LUE and LLE. DTR's 2++ on left, toes down, no resting tone. Aaron Aas    Psychiatric:        Mood and Affect: Mood normal.        Behavior: Behavior normal.        Lab Results Last 24 Hours       Results for orders placed or performed during the hospital encounter of 02/14/23 (from the past 24  hours)  Basic metabolic panel     Status: Abnormal    Collection Time: 02/16/23  4:38 AM  Result Value Ref Range    Sodium 136 135 - 145 mmol/L    Potassium 3.5 3.5 - 5.1 mmol/L    Chloride 102 98 - 111 mmol/L    CO2 22 22 - 32 mmol/L    Glucose, Bld 107 (H) 70 - 99 mg/dL    BUN 15 8 - 23 mg/dL    Creatinine, Ser 3.24 (H) 0.61 - 1.24 mg/dL    Calcium  9.3 8.9 - 10.3 mg/dL    GFR, Estimated 52 (L) >60 mL/min    Anion gap 12 5 - 15  CBC     Status: Abnormal    Collection Time: 02/16/23  4:38 AM  Result Value Ref Range    WBC 5.4 4.0 - 10.5 K/uL    RBC 4.55 4.22 - 5.81 MIL/uL    Hemoglobin 12.8 (L) 13.0 - 17.0 g/dL    HCT 40.1 02.7 - 25.3 %    MCV 87.9 80.0 - 100.0 fL    MCH 28.1 26.0 - 34.0 pg    MCHC 32.0 30.0 - 36.0 g/dL    RDW 66.4 40.3 - 47.4 %    Platelets 174 150 - 400 K/uL    nRBC 0.0 0.0 - 0.2 %       Imaging Results (Last 48 hours)  CT HEAD WO CONTRAST ( ) Result Date: 02/16/2023 CLINICAL DATA:  Stroke follow-up EXAM: CT HEAD WITHOUT CONTRAST TECHNIQUE: Contiguous axial images were obtained from the base of the skull through the vertex without intravenous contrast. RADIATION DOSE REDUCTION: This exam was performed according to the departmental dose-optimization program which includes automated exposure control, adjustment of the mA and/or kV according to patient size and/or use of iterative reconstruction technique. COMPARISON:  None  Available. FINDINGS: Brain: Involving cytotoxic edema in the right thalamus and right occipital lobe at the site of known infarcts. 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 No acute hemorrhage. No midline shift or significant mass effect. Vascular: No hyperdense vessel or unexpected calcification. Skull: Normal. Negative for fracture or focal lesion. Sinuses/Orbits: No acute finding. Other: None. IMPRESSION: 1. Involving cytotoxic edema in the right thalamus and right occipital lobe at the site of known infarcts. No acute hemorrhage. 2. Unchanged large left anterior para falcine calcified meningioma. Electronically Signed   By: Juanetta Nordmann M.D.   On: 02/16/2023 01:23    ECHOCARDIOGRAM COMPLETE Result Date: 02/15/2023    ECHOCARDIOGRAM REPORT   Patient Name:   LAQUINTON TOOLE Date of Exam: 02/15/2023 Medical Rec #:  [id number]      Height:       69.0 in Accession #:    [id number]     Weight:       223.1 lb Date of Birth:  07-Nov-1950      BSA:          2.164 m Patient Age:    72 years       BP:           120/71 mmHg Patient Gender: M              HR:           81 bpm. Exam Location:  Inpatient Procedure: 2D Echo, Cardiac Doppler, Color Doppler and Intracardiac            Opacification Agent Indications:    Stroke  History:  Patient has prior history of Echocardiogram examinations, most                 recent 03/30/2013. CHF; Stroke.  Sonographer:    Amy Chionchio Referring Phys: KHALIQDINA, SALMAN IMPRESSIONS  1. No evidence of LV thrombus. Definity  is administered. Left ventricular ejection fraction, by estimation, is 55%. The left ventricle has normal function. The left ventricle has no regional wall motion abnormalities. Left ventricular diastolic parameters are consistent with Grade I diastolic dysfunction (impaired relaxation).  2. Right ventricular systolic function was not well visualized. The right ventricular size is not well visualized. Tricuspid regurgitation signal is  inadequate for assessing PA pressure.  3. The mitral valve is normal in structure. No evidence of mitral valve regurgitation. No evidence of mitral stenosis.  4. The aortic valve was not well visualized. Aortic valve regurgitation is not visualized. No aortic stenosis is present.  5. Aortic dilatation noted. There is borderline dilatation of the aortic root, measuring 36 mm. There is mild dilatation of the ascending aorta, measuring 40 mm. Comparison(s): No prior Echocardiogram. FINDINGS  Left Ventricle: No evidence of LV thrombus. Definity  is administered. Left ventricular ejection fraction, by estimation, is 55%. The left ventricle has normal function. The left ventricle has no regional wall motion abnormalities. The left ventricular internal cavity size was normal in size. There is no left ventricular hypertrophy. Left ventricular diastolic parameters are consistent with Grade I diastolic dysfunction (impaired relaxation). Normal left ventricular filling pressure. Right Ventricle: The right ventricular size is not well visualized. Right vetricular wall thickness was not well visualized. Right ventricular systolic function was not well visualized. Tricuspid regurgitation signal is inadequate for assessing PA pressure. Left Atrium: Left atrial size was normal in size. Right Atrium: Right atrial size was normal in size. Pericardium: There is no evidence of pericardial effusion. Mitral Valve: The mitral valve is normal in structure. Mild mitral annular calcification. No evidence of mitral valve regurgitation. No evidence of mitral valve stenosis. MV peak gradient, 3.1 mmHg. The mean mitral valve gradient is 1.0 mmHg. Tricuspid Valve: The tricuspid valve is not well visualized. Tricuspid valve regurgitation is not demonstrated. No evidence of tricuspid stenosis. Aortic Valve: The aortic valve was not well visualized. Aortic valve regurgitation is not visualized. No aortic stenosis is present. Aortic valve mean  gradient measures 5.0 mmHg. Aortic valve peak gradient measures 8.5 mmHg. Aortic valve area, by VTI measures 1.87 cm. Pulmonic Valve: The pulmonic valve was not well visualized. Pulmonic valve regurgitation is not visualized. No evidence of pulmonic stenosis. Aorta: Aortic dilatation noted. There is borderline dilatation of the aortic root, measuring 36 mm. There is mild dilatation of the ascending aorta, measuring 40 mm. Venous: The inferior vena cava was not well visualized. IAS/Shunts: No atrial level shunt detected by color flow Doppler.  LEFT VENTRICLE PLAX 2D LVIDd:         5.10 cm      Diastology LVIDs:         4.10 cm      LV e' medial:    5.33 cm/s LV PW:         1.00 cm      LV E/e' medial:  6.7 LV IVS:        1.00 cm      LV e' lateral:   4.68 cm/s LVOT diam:     2.10 cm      LV E/e' lateral: 7.6 LV SV:         46  LV SV Index:   21 LVOT Area:     3.46 cm  LV Volumes (MOD) LV vol d, MOD A4C: 198.0 ml LV vol s, MOD A4C: 98.5 ml LV SV MOD A4C:     198.0 ml RIGHT VENTRICLE TAPSE (M-mode): 2.1 cm LEFT ATRIUM           Index        RIGHT ATRIUM           Index LA Vol (A4C): 56.0 ml 25.87 ml/m  RA Area:     11.10 cm                                    RA Volume:   20.60 ml  9.52 ml/m  AORTIC VALVE AV Area (Vmax):    1.99 cm AV Area (Vmean):   1.86 cm AV Area (VTI):     1.87 cm AV Vmax:           146.00 cm/s AV Vmean:          109.000 cm/s AV VTI:            0.244 m AV Peak Grad:      8.5 mmHg AV Mean Grad:      5.0 mmHg LVOT Vmax:         83.80 cm/s LVOT Vmean:        58.400 cm/s LVOT VTI:          0.132 m LVOT/AV VTI ratio: 0.54  AORTA Ao Root diam: 3.60 cm Ao Asc diam:  4.00 cm MITRAL VALVE MV Area (PHT): 5.66 cm    SHUNTS MV Area VTI:   3.22 cm    Systemic VTI:  0.13 m MV Peak grad:  3.1 mmHg    Systemic Diam: 2.10 cm MV Mean grad:  1.0 mmHg MV Vmax:       0.88 m/s MV Vmean:      48.6 cm/s MV Decel Time: 134 msec MV E velocity: 35.70 cm/s MV A velocity: 76.10 cm/s MV E/A ratio:  0.47 Vishnu Priya  Mallipeddi Electronically signed by Lucetta Russel Mallipeddi Signature Date/Time: 02/15/2023/2:14:49 PM    Final     MR BRAIN WO CONTRAST Result Date: 02/15/2023 CLINICAL DATA:  Stroke follow-up. Status post revascularization of right P2 occlusion. EXAM: MRI HEAD WITHOUT CONTRAST TECHNIQUE: Multiplanar, multiecho pulse sequences of the brain and surrounding structures were obtained without intravenous contrast. COMPARISON:  Head CT and MRI 02/14/2023 FINDINGS: The patient was unable to tolerate the complete examination which was terminated prematurely. Axial and coronal diffusion, axial T2, axial FLAIR, and sagittal T1 sequences were obtained and are mildly to moderately motion degraded. Brain: Restricted diffusion is again seen within the mesial right temporal lobe and right thalamus, mildly progressed from the prior MRI and with new hypointense signal centrally within the right thalamic infarct being suspicious for a small amount of hemorrhage (with assessment limited on this incomplete examination which did not include susceptibility weighted or gradient echo imaging). Patchy restricted diffusion in the right periatrial white matter and in the region of the posterior body and tail of the caudate nucleus is largely new, and there is also a new 2 cm focus of restricted diffusion in the right occipital lobe. 2 new punctate acute infarcts are present in the left occipital lobe. Patchy T2 hyperintensities elsewhere in the cerebral white matter bilaterally are unchanged and nonspecific but compatible with  mild chronic small vessel ischemic disease. 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 There is no midline shift or extra-axial fluid collection. There is mild generalized cerebral atrophy. Small chronic right cerebellar infarcts are again noted. Vascular: Major intracranial vascular flow voids are preserved. Skull and upper cervical spine: No suspicious marrow lesion. Advanced left facet arthrosis at C3-4  and C4-5. Sinuses/Orbits: Right cataract extraction. No evidence of significant inflammation in the paranasal sinuses. Clear mastoid air cells. Other: None. IMPRESSION: 1. Incomplete, motion degraded examination. 2. Progressive acute right PCA territory infarct with extension most notable in the right periatrial white matter and occipital lobe. Suspected subcentimeter hemorrhage centrally in the right thalamic infarct. 3. New punctate acute infarcts in the left occipital lobe. 4. Mild chronic small vessel ischemic disease. 5. Known left anterior parafalcine meningioma. Electronically Signed   By: Aundra Lee M.D.   On: 02/15/2023 10:35       Assessment/Plan: Diagnosis: 73 year old male with right PCA scattered infarcts involving the right periatrial white matter and occipital lobe as well as the right thalamus and left occipital lobe. Does the need for close, 24 hr/day medical supervision in concert with the patient's rehab needs make it unreasonable for this patient to be served in a less intensive setting? Yes Co-Morbidities requiring supervision/potential complications:  -Congestive heart failure -History of prior right PICA infarct -History of CHF -Hypertension -Acute kidney injury -Delirium Due to bladder management, bowel management, safety, skin/wound care, disease management, medication administration, and patient education, does the patient require 24 hr/day rehab nursing? Yes Does the patient require coordinated care of a physician, rehab nurse, therapy disciplines of PT, OT, SLP to address physical and functional deficits in the context of the above medical diagnosis(es)? Yes Addressing deficits in the following areas: balance, endurance, locomotion, strength, transferring, bowel/bladder control, bathing, dressing, feeding, grooming, toileting, cognition, and psychosocial support Can the patient actively participate in an intensive therapy program of at least 3 hrs of therapy per day at  least 5 days per week? Yes The potential for patient to make measurable gains while on inpatient rehab is excellent Anticipated functional outcomes upon discharge from inpatient rehab are supervision and min assist  with PT, supervision and min assist with OT, modified independent with SLP. Estimated rehab length of stay to reach the above functional goals is: 21-25 days Anticipated discharge destination: Home Overall Rehab/Functional Prognosis: excellent   POST ACUTE RECOMMENDATIONS: This patient's condition is appropriate for continued rehabilitative care in the following setting: CIR Patient has agreed to participate in recommended program. Yes Note that insurance prior authorization may be required for reimbursement for recommended care.   Comment: Pt is motivated to regain functional mobility and use of his left side. Wife is supportive. Rehab Admissions Coordinator to follow up.       MEDICAL RECOMMENDATIONS: Will order PRAFO LLE to stretch heel cord.       I have personally performed a face to face diagnostic evaluation of this patient. Additionally, I have examined the patient's medical record including any pertinent labs and radiographic images. If the physician assistant has documented in this note, I have reviewed and edited or otherwise concur with the physician assistant's documentation.   Thanks,   Edward Caddy, MD 02/16/2023          Routing History

## 2023-02-23 NOTE — Progress Notes (Signed)
 Staff called to room and found patient on floor after sliding out of wheelchair. Patient stated he was just sitting in the wheelchair and slid forward , patient did have on alarm belt, wheelchair locked , and everything within reach. Patient did have on slick pants and the wheelchair cushion that is also slippery that possibly contributed to fall. Dan PA notified and at bedside. No injuries. Vital signs stable. No new orders at this time.   Edward Rodgers

## 2023-02-23 NOTE — Discharge Summary (Signed)
 Physician Discharge Summary  Patient ID: Edward Rodgers MRN: [id number] DOB/AGE: [birth year] 72 y.o.  Admit date: 02/21/2023 Discharge date: 03/28/2023  Discharge Diagnoses:  Principal Problem:   Acute right PCA stroke [facility]) Active Problems:   Coping style affecting medical condition   Precordial pain   Shortness of breath   Benign hypertension   Heart failure with improved ejection fraction (HFimpEF) (HCC)   Pulmonary embolus (HCC)   Acute deep vein thrombosis (DVT) of popliteal vein of left lower extremity (HCC)   Chronic heart failure with mildly reduced ejection fraction (HFmrEF, 41-49%) (HCC)   AKI (acute kidney injury) (HCC)   Hyponatremia   Cognitive change Acute problems: Functional deficits secondary to acute right PCA stroke Sundowning/delirium Hypothyroidism Hypertension History of prostate cancer Hyperlipidemia Constipation History of gout Insomnia Cervical stenosis  Discharged Condition: stable  Significant Diagnostic Studies:  Lower Venous DVT Study   Patient Name:  Edward Rodgers  Date of Exam:   03/04/2023  Medical Rec #: [id number]       Accession #:    [id number]  Date of Birth: [birth year]       Patient Gender: M  Patient Age:   73 years  Exam Location:  [facility]  Procedure:      VAS [place] LOWER EXTREMITY VENOUS (DVT)  Referring Phys: Mertha Baars    ---------------------------------------------------------------------------  -----    Indications: Pulmonary embolism, and stroke.    Comparison Study: Previous study on 2.4.2025.   Performing Technologist: Fernande Bras     Examination Guidelines:  A complete evaluation includes B-mode imaging, spectral Doppler, color  Doppler,  and power Doppler as needed of all accessible portions of each vessel.  Bilateral  testing is considered an integral part of a complete examination. Limited  examinations for reoccurring indications may be performed as noted. The  reflux  portion of the  exam is performed with the patient in reverse  Trendelenburg.     +---------+---------------+---------+-----------+----------+--------------+   RIGHT   CompressibilityPhasicitySpontaneityPropertiesThrombus  Aging  +---------+---------------+---------+-----------+----------+--------------+   CFV     Full           Yes      Yes                                   +---------+---------------+---------+-----------+----------+--------------+   SFJ     Full           Yes      Yes                                   +---------+---------------+---------+-----------+----------+--------------+   FV Prox  Full                                                          +---------+---------------+---------+-----------+----------+--------------+   FV Mid   Full                                                          +---------+---------------+---------+-----------+----------+--------------+   FV  DistalFull                                                          +---------+---------------+---------+-----------+----------+--------------+   PFV     Full                                                          +---------+---------------+---------+-----------+----------+--------------+   POP     Full           Yes      Yes                                   +---------+---------------+---------+-----------+----------+--------------+   PTV     Full                                                          +---------+---------------+---------+-----------+----------+--------------+   PERO    Full                                                          +---------+---------------+---------+-----------+----------+--------------+       +---------+---------------+---------+-----------+----------+--------------+   LEFT    CompressibilityPhasicitySpontaneityPropertiesThrombus  Aging   +---------+---------------+---------+-----------+----------+--------------+   CFV     Full           Yes      Yes                                   +---------+---------------+---------+-----------+----------+--------------+   SFJ     Full           Yes      Yes                                   +---------+---------------+---------+-----------+----------+--------------+   FV Prox  Full                                                          +---------+---------------+---------+-----------+----------+--------------+   FV Mid   Full                                                          +---------+---------------+---------+-----------+----------+--------------+   FV DistalFull                                                          +---------+---------------+---------+-----------+----------+--------------+  PFV     Full                                                          +---------+---------------+---------+-----------+----------+--------------+   POP     None           No       No                                    +---------+---------------+---------+-----------+----------+--------------+   PTV     Full                                                          +---------+---------------+---------+-----------+----------+--------------+   PERO    Full                                                          +---------+---------------+---------+-----------+----------+--------------+   Soleal  None           No       No                                    +---------+---------------+---------+-----------+----------+--------------+   IM      None           No       No                                    +---------+---------------+---------+-----------+----------+--------------+   Summary:  RIGHT:  - There is no evidence of deep vein thrombosis in the lower extremity.    - No cystic  structure found in the popliteal fossa.    LEFT:  - Findings consistent with acute deep vein thrombosis involving the left  popliteal vein.  Findings consistent with acute intramuscular thrombosis involving the left  soleal veins, and left intramuscular calf vein.   - No cystic structure found in the popliteal fossa.    *See table(s) above for measurements and observations.   Electronically signed by Coral Else MD on 03/04/2023 at 8:14:36 PM.     Final     Narrative & Impression  CLINICAL DATA:  Shortness of breath.   EXAM: CHEST - 2 VIEW   COMPARISON:  Radiographs 06/08/2008 and 01/05/2004.   FINDINGS: Lower lung volumes with mildly increased bibasilar atelectasis. No confluent airspace disease, pleural effusion or pneumothorax. The heart size and mediastinal contours are stable with mild aortic tortuosity. Loop recorder noted in the left anterior chest wall. There are stable degenerative changes in the spine without evidence of acute osseous abnormality.   IMPRESSION: Lower lung volumes with mildly increased bibasilar atelectasis. No other evidence of acute cardiopulmonary process.     Electronically Signed   By: Carey Bullocks  M.D.   On: 03/03/2023 15:01   ADDENDUM REPORT: 03/04/2023 11:36   ADDENDUM: The original report was by Dr. Gaylyn Rong. The following addendum is by Dr. Gaylyn Rong:   Critical Value/emergent results were called by telephone at the time of interpretation on 03/04/2023 at 11:15 am to provider [facility] , who verbally acknowledged these results.     Electronically Signed   By: Gaylyn Rong M.D.   On: 03/04/2023 11:36          Narrative & Impression  CLINICAL DATA:  Initial evaluation for acute confusion, vision change.   EXAM: CT HEAD WITHOUT CONTRAST   TECHNIQUE: Contiguous axial images were obtained from the base of the skull through the vertex without intravenous contrast.   RADIATION DOSE  REDUCTION: This exam was performed according to the departmental dose-optimization program which includes automated exposure control, adjustment of the mA and/or kV according to patient size and/or use of iterative reconstruction technique.   COMPARISON:  Comparison made with prior CT from 02/16/2023 and MRI from 02/15/2023   FINDINGS: Brain: Previously identified evolving subacute right PCA distribution infarct again seen, overall relatively stable in size and distribution as compared to previous MRI. No evidence for hemorrhagic transformation. No significant regional mass effect.   No other new acute cortically based infarct. No acute intracranial hemorrhage. Left parafalcine calcified meningioma overlying the anterior left frontal convexity again noted, stable. No hydrocephalus or extra-axial fluid collection.   Vascular: No abnormal hyperdense vessel. Scattered vascular calcifications noted within the carotid siphons.   Skull: Scalp soft tissues demonstrate no acute finding. Calvarium intact.   Sinuses/Orbits: Globes and orbital soft tissues demonstrate no acute finding. Visualized paranasal sinuses and mastoid air cells remain largely clear.   Other: None.   IMPRESSION: 1. Evolving subacute right PCA distribution infarct, overall relatively stable in size and distribution as compared to previous MRI. No evidence for hemorrhagic transformation or significant regional mass effect. 2. No other new acute intracranial abnormality. 3. Stable left parafalcine calcified meningioma overlying the anterior left frontal convexity.     Electronically Signed   By: Rise Mu M.D.   On: 03/01/2023 19:08      Labs:  Basic Metabolic Panel: Recent Labs  Lab 03/26/23 0548  NA 136  K 4.0  CL 101  CO2 28  GLUCOSE 93  BUN 18  CREATININE 1.00  CALCIUM 9.3    CBC: No results for input(s): "WBC", "NEUTROABS", "HGB", "HCT", "MCV", "PLT" in the last 168  hours.   Brief HPI:   Edward Rodgers is a 73 y.o. male with history of diastolic congestive heart failure, gout, hypothyroidism, prior cerebellar infarct with residual diplopia maintained on full-strength aspirin, prostate cancer, tobacco use.  Per chart review patient lives with spouse.  Two-level home bed and bath on main level one-step to entry.  Modified independent prior to admission.  Presented to 02/02/2023 with left-sided weakness numbness and disconjugate gaze.  CT/MRI showed diffusion hyperintensity in the right PCA distribution affecting the medial temporal lobe and thalamus.  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  CTA right P2 occlusion presumably acute in the setting.  Extensive intracranial atherosclerosis.  CT perfusion of the brain showed increased ischemic range perfusion on the right PCA distribution measuring 36 cc as compared to 25 cc on prior imaging.  Admission chemistries unremarkable except glucose 115 BUN 24, hemoglobin A1c 6.4.  Echocardiogram with ejection fraction of 55% no wall motion abnormalities grade 1 diastolic dysfunction.  Patient did receive TNK.  MRI follow-up revealed progressive acute right PCA territory infarct with extension most notable in the right periatrial white matter and occipital lobe.  There was suspected subcentimeter hemorrhage centrally in the right thalamic infarct.  New punctate infarct in the left occipital lobe also seen.  Neurology follow-up placed on low-dose aspirin and Plavix for CVA prophylaxis x 3 weeks then Plavix alone.  Subcutaneous heparin for DVT prophylaxis.  Mild AKI 1.20-1.44 placed on gentle IV fluids since discontinued with latest creatinine 1.23.  Loop recorder was placed 2/07.  The patient's wife shared with Dr. Roda Shutters and MRI of the C-spine from the [place] which showed severe spinal stenosis and early signs of myelopathy.  He explained that the patient is not currently a candidate for cervical surgery at this  time but [facility].   Hospital Course: Edward Rodgers was admitted to rehab 02/21/2023 for inpatient therapies to consist of PT, ST and OT at least three hours five days a week. Past admission physiatrist, therapy team and rehab RN have worked together to provide customized collaborative inpatient rehab.  Follow-up labs on 2/10 revealed elevated BUN to 29 and serum creatinine to 1.38.  CBC within normal limits.  Encouraged p.o. fluids.  Labs rechecked on 2/11 with further increase in BUN and creatinine to 39 and 1.73 respectively.  Spironolactone decreased.  IVFs initiated. Seroquel 50 mg scheduled and 25 mg as needed continued for insomnia.  Added melatonin 3 mg at bedtime 2/10.  Nursing noted small amount of blood at head of penis on 2/10.  Urinalysis obtained.  No further blood noted.  IVFs discontinued on 2/14. Follow-up CBC 2/17 stable.  Neuropsychology consult obtained on 2/14. Vision disturbance noted on 2/16. Contacted neurology and CT head and overnight EEG obtained. No seizure activity noted.  Likely this is continuation of episodes patient has stated has been going on since prior to his stroke. Reported chest pain on the morning of 2/18. EKG and chest x-ray obtained. Troponins mildly elevated. Cardiology consulted. D-dimer >20.00. CTA chest scan PE protocol obtained 2/19 positive for PE. Neurology and pulmonology consulted and started on heparin infusion.  Per pulmonology evaluation, "continue anticoagulation along with transition to NoAC when cleared by neurology.  Review of CTA, has baseline large pulmonary artery related to emphysema with likely exaggerated response to overall small clot burden.  The clot itself seems more chronic. Suspect it has been there for a week or 2.  Consider repeat echo in 3 months to guide [facility] duration but consider this provoked in context of stroke and immobility.  24h of bedrest with the [facility] before mobilizing again." No additional  cardiovascular testing is warranted for precordial pain. BUN/Cr 24/1.44 on 2/19. Started NS at 75 cc/hr overnight 2/19. BLE venous duplex performed with findings of left popliteal DVT. Aspirin and Plavix discontinued on 2/20. Some daytime somnolence noted 2/20 and UA obtained and Seroquel decreased to 25 mg q HS. Serum creatinine improved to 1.25 on 2/20.  Eliquis started 2/21. Chest pian resolved. Mild delirium noted on 2/24 and UA performed without evidence of infection. CXR with pulmonary vascular congestion WBC down to 7.5 on 2/25. Sleep and mentation improved. Continued gentle overnight IVFs as creatinine 1.44 and PO intake suboptimal. Follow-up labs 2/28 and 3/03 with normal BUN and Cr.  Neuropsychology consultation obtained on 3/04.  BUN and creatinine with slow trend upward and restarted nighttime IV fluids at 50 cc/h on 3/06.  Unwitnessed fall from wheelchair  evening of 3/10.  CT of head obtained without acute findings.  No injury otherwise and no change in neuro/mental status. His drug coverage for Mybetriq is $302 and is $500 for cash pay. Discontinued and started oxybutynin ER 10mg  daily at discharge. That may not be enough for him, but it has more side effects than Myrbetriq. Started on lower dose.   Blood pressures were monitored on TID basis and Norvasc 10 mg daily, Toprol-XL 100 mg daily and Aldactone 25 mg daily continued. Aldactone discontinued 2/11. Discontinued Norvasc and started losartan 25 mg daily on 2/21. Increased losartan to 50 mg daily on 3/04. Restarted Norvasc 2.5 mg daily on 3/04.  Increased Norvasc to 5 mg daily on 3/06.  Decreased losartan to 25 mg on 3/06 and increase Norvasc to 7.5 mg 3/07.  Norvasc increased to 10 mg daily on 3/11.  Restarted spironolactone at 12.5 mg daily.  Increase losartan to 50 mg daily 3/13 as his diastolic blood pressure remained elevated.  Rehab course: During patient's stay in rehab weekly team conferences were held to monitor patient's progress,  set goals and discuss barriers to discharge. At admission, patient required mod to max assist with basic self-care skills, max assist with mobility.  He has had improvement in activity tolerance, balance, postural control as well as ability to compensate for deficits. He has had improvement in functional use LUE  and LLE as well as improvement in awareness. Patient has met 11 of 11 long term goals due to improved activity tolerance, improved balance, postural control, ability to compensate for deficits, functional use of  LEFT upper and LEFT lower extremity, improved attention, and improved awareness. Patient to discharge at [facility] Assist level, sponge-bathing (no tub/shower bathing) and utilizing Corene Cornea imitation for all functional transfers and LB dressing for increased safety and decrease of caregiver burnout.      Home health PT, OT, RN arranged through [facility]. COULD NOT FIND SPEECH TO SERVICE [place]-PT AND FAMILY AWARE    Discharge disposition: 06-Home-Health Care Svc     Diet: Heart healthy  Special Instructions: No driving, alcohol consumption or tobacco use.  Patient can follow up with neurosurgery in [place] or if he and patient wants, we can refer him to our neurosurgery regarding severe cervical stenosis.  Follow up with his cardiology provider at the [facility] [place] [facility] post-discharge.  Note per cardiology consulted during admission states to follow-up with Dr. Odis Hollingshead in 3 to 4 weeks. Consider repeat echo in 3 months to guide [facility] duration but consider this provoked in context of stroke and immobility.     Discharge Instructions     Ambulatory referral to Physical Medicine Rehab   Complete by: As directed    Hospital follow-up      Allergies as of 03/28/2023   No Known Allergies      Medication List     STOP taking these medications    aspirin EC 81 MG tablet   calcium-vitamin D 500-5 MG-MCG tablet Commonly known as: OSCAL WITH D   clopidogrel 75 MG tablet Commonly  known as: PLAVIX   empagliflozin 25 MG Tabs tablet Commonly known as: JARDIANCE   feeding supplement Liqd       TAKE these medications    alendronate 70 MG tablet Commonly known as: FOSAMAX Take 70 mg by mouth once a week. Take with a full glass of water on an empty stomach. mondays   allopurinol 300 MG tablet Commonly known as: ZYLOPRIM Take 300 mg by mouth every  morning.   amLODipine 10 MG tablet Commonly known as: NORVASC Take 1 tablet (10 mg total) by mouth daily.   atorvastatin 80 MG tablet Commonly known as: LIPITOR Take 1 tablet (80 mg total) by mouth daily.   Eliquis 5 MG Tabs tablet Generic drug: apixaban Take 1 tablet (5 mg total) by mouth 2 (two) times daily.   fluticasone 50 MCG/ACT nasal spray Commonly known as: FLONASE Place 1 spray into both nostrils daily.   latanoprost 0.005 % ophthalmic solution Commonly known as: XALATAN Place 1 drop into the right eye at bedtime.   levothyroxine 88 MCG tablet Commonly known as: SYNTHROID Take 1 tablet (88 mcg total) by mouth daily before breakfast.   lidocaine 5 % Commonly known as: LIDODERM Place 1 patch onto the skin daily. Remove & Discard patch within 12 hours or as directed by MD   losartan 50 MG tablet Commonly known as: COZAAR Take 1 tablet (50 mg total) by mouth daily.   melatonin 3 MG Tabs tablet Take 1 tablet (3 mg total) by mouth at bedtime.   metoprolol succinate 100 MG 24 hr tablet Commonly known as: TOPROL-XL Take 1 tablet (100 mg total) by mouth daily. Take with or immediately following a meal.   oxybutynin 10 MG 24 hr tablet Commonly known as: DITROPAN-XL Take 1 tablet (10 mg total) by mouth at bedtime.   QUEtiapine 50 MG tablet Commonly known as: SEROQUEL Take 1 tablet (50 mg total) by mouth at bedtime.   spironolactone 25 MG tablet Commonly known as: ALDACTONE Take 0.5 tablets (12.5 mg total) by mouth daily. What changed: how much to take   venlafaxine XR 150 MG 24 hr  capsule Commonly known as: EFFEXOR-XR Take 1 capsule (150 mg total) by mouth every evening.        Follow-up Information     Fanny Dance, MD Follow up.   Specialty: Physical Medicine and Rehabilitation Why: office will call you to arrange your appt (sent) Contact information: [address] [place] [address] [phone]         [facility] Follow up.   Why: Call the office on Monday to make arrangements for hospital follow-up appointment. Contact information: [address]     [address] [place] [address] [phone]        Robyne Askew, MD Follow up.   Specialty: Urology Why: Call the officeon Monday to make arrangments for hospital follow-up appointment. New medicine: oxybutynin XL 10 mg daily Contact information: [address] [place] [address] [phone]         Benita Stabile, MD Follow up.   Specialty: Internal Medicine Why: Call the office on Monday to make arrangements for hospital follow-up appointment. Contact information: [address] [place] [address] [phone]         Tessa Lerner, DO Follow up.   Specialties: Cardiology, Vascular Surgery Why: Call the office on Monday to make arrangements for hospital follow-up appointment. (unless required by [facility] to follow-up with established cardiologist) Contact information: [address] [place] [address] [phone]                 Signed: Milinda Antis, PA-C 03/30/2023, 8:45 AM

## 2023-02-23 NOTE — Progress Notes (Signed)
   02/23/23 1610  What Happened  Was fall witnessed? No  Was patient injured? No  Patient found on floor  Found by Staff-comment  Stated prior activity other (comment) (Sitting in chair and slid forward)  Provider Notification  Provider Name/Title Dan PA  Date Provider Notified 02/23/23  Time Provider Notified 0940  Method of Notification Face-to-face  Notification Reason Fall  Provider response At bedside  Date of Provider Response 02/23/23  Time of Provider Response 0945  Follow Up  Family notified Yes - comment  Time family notified 0945  Additional tests No  Simple treatment Other (comment)  Progress note created (see row info) Yes  Blank note created Yes  Adult Fall Risk Assessment  Risk Factor Category (scoring not indicated) Fall has occurred during this admission (document High fall risk)  Age 73  Fall History: Fall within 6 months prior to admission 5  Elimination; Bowel and/or Urine Incontinence 2  Elimination; Bowel and/or Urine Urgency/Frequency 0  Medications: includes PCA/Opiates, Anti-convulsants, Anti-hypertensives, Diuretics, Hypnotics, Laxatives, Sedatives, and Psychotropics 5  Patient Care Equipment 1  Mobility-Assistance 2  Mobility-Gait 2  Mobility-Sensory Deficit 2  Altered awareness of immediate physical environment 0  Impulsiveness 0  Lack of understanding of one's physical/cognitive limitations 0  Total Score 21  Patient Fall Risk Level High fall risk  Adult Fall Risk Interventions  Required Bundle Interventions *See Row Information* High fall risk - low, moderate, and high requirements implemented  Additional Interventions Use of appropriate toileting equipment (bedpan, BSC, etc.)  Fall intervention(s) refused/Patient educated regarding refusal Bed alarm;Nonskid socks;Yellow bracelet  Screening for Fall Injury Risk (To be completed on HIGH fall risk patients) - Assessing Need for Floor Mats  Risk For Fall Injury- Criteria for Floor Mats Bleeding  risk-anticoagulation (not prophylaxis)  Will Implement Floor Mats Yes  Vitals  Temp 97.7 F (36.5 C)  Temp Source Oral  BP 131/83  MAP (mmHg) 98  BP Location Right Arm  BP Method Automatic  Patient Position (if appropriate) Sitting  Pulse Rate 84  Pulse Rate Source Monitor  Resp 18  Oxygen Therapy  SpO2 100 %  O2 Device Room Air  Pain Assessment  Pain Scale 0-10  Pain Score 0  PCA/Epidural/Spinal Assessment  Respiratory Pattern Regular;Unlabored  Neurological  Neuro (WDL) WDL  Level of Consciousness Alert  Orientation Level Oriented X4  Cognition Follows commands  Speech Clear  Musculoskeletal  Musculoskeletal (WDL) X  Assistive Device Stedy  Generalized Weakness Yes  Weight Bearing Restrictions Per Provider Order No  Integumentary  Integumentary (WDL) X  Skin Color Appropriate for ethnicity  Skin Condition Dry  Skin Integrity Abrasion;Ecchymosis  Abrasion Location Leg  Abrasion Location Orientation Left  Abrasion Intervention Foam  Ecchymosis Location Abdomen  Ecchymosis Location Orientation Bilateral  Skin Turgor Non-tenting

## 2023-02-23 NOTE — Progress Notes (Addendum)
 PROGRESS NOTE   Subjective/Complaints:  Edward Rodgers reports he is doing well overall.  Reports he had some shortness of breath last night but this is resolved today.  Reports he did not sleep well last night.  Reports he feels a little bit constipated this morning, although appears that later in the morning he had a large BM.  ROS: as per HPI. Denies CP,  abd pain, N/V/D, or any other complaints at this time.   Constipation-had a BM later in the morning Shortness of breath resolved today  Objective:   No results found. Recent Labs    02/21/23 1534 02/23/23 0507  WBC 6.7 5.6  HGB 13.0 12.7*  HCT 40.6 39.7  PLT 207 189   Recent Labs    02/21/23 1534 02/23/23 0507  NA  --  138  K  --  4.3  CL  --  97*  CO2  --  22  GLUCOSE  --  103*  BUN  --  29*  CREATININE 1.22 1.38*  CALCIUM   --  10.6*        Intake/Output Summary (Last 24 hours) at 02/23/2023 1617 Last data filed at 02/23/2023 0725 Gross per 24 hour  Intake 300 ml  Output --  Net 300 ml     Pressure Injury 02/16/23 Buttocks Right Stage 2 -  Partial thickness loss of dermis presenting as a shallow open injury with a red, pink wound bed without slough. pink, red (Active)  02/16/23 1420  Location: Buttocks  Location Orientation: Right  Staging: Stage 2 -  Partial thickness loss of dermis presenting as a shallow open injury with a red, pink wound bed without slough.  Wound Description (Comments): pink, red  Present on Admission: Yes    Physical Exam: Vital Signs Blood pressure 119/84, pulse 79, temperature 98.2 F (36.8 C), temperature source Oral, resp. rate 17, height 5\' 9"  (1.753 m), weight 91.5 kg, SpO2 98%.  Constitutional: NAD HENT:     Head: Normocephalic and atraumatic.     Nose: Nose normal.     Mouth/Throat:     Mouth: Mucous membranes are moist.  Eyes:     Extraocular Movements: Extraocular movements intact.     Conjunctiva/sclera:  Conjunctivae normal.     Pupils: Pupils are equal, round, and reactive to light.  Cardiovascular:     Rate and Rhythm: Normal rate and regular rhythm.     Heart sounds: Murmur heard.  Pulmonary:     Effort: Pulmonary effort is normal. No respiratory distress.     Breath sounds: No stridor. No wheezing or rhonchi.  Abdominal:     General: Bowel sounds are normal. There is no distension.     Palpations: Abdomen is soft.     Tenderness: There is no abdominal tenderness.  Musculoskeletal:        General: Swelling (trace LUE) present.     Cervical back: Normal range of motion.      No pedal edema appreciated Skin:    General: Skin is warm and dry.     Findings: No bruising or erythema.  Psychiatric:     Comments: Pt pleasant and cooperative   Neurological:  Mental Status: He is alert.     Comments: Alert and oriented x 4.  Dysarthric speech.  Fair insight and awareness.  Language overall appears to be intact.  Left central 7.  Decreased sensation to light touch in left upper extremity and left lower extremity.  Moving right upper extremity and right lower extremity to gravity.  Prior Exam Pt is alert. Oriented to person, place, month/year. Knew why he was here. Told me who he was rooting for in [employer] tomorrow. Was a little drowsy, nodded off a few times during my visit. Speech dysarthric. Language appears intact. Fair insight and awareness. Left central 7 and tongue deviation. LUE 1/5 prox to distal. LLLE 1 to 1+/5 prox to distal. RUE 4/5, RLE 4- to 4/5. Decreased LT and pain in left leg and arm as wel. DTR's 2+ on left. Toes up. Heel cord a little tight, wearing PRAFO. No resting tone appreciated.    Assessment/Plan: 1. Functional deficits which require 3+ hours per day of interdisciplinary therapy in a comprehensive inpatient rehab setting. Physiatrist is providing close team supervision and 24 hour management of active medical problems listed below. Physiatrist and rehab team  continue to assess barriers to discharge/monitor patient progress toward functional and medical goals  Care Tool:  Bathing    Body parts bathed by patient: Left arm, Chest, Abdomen, Front perineal area, Right upper leg, Left upper leg, Right lower leg, Face   Body parts bathed by helper: Right arm, Buttocks, Left lower leg     Bathing assist Assist Level: Moderate Assistance - Patient 50 - 74%     Upper Body Dressing/Undressing Upper body dressing        Upper body assist Assist Level: Moderate Assistance - Patient 50 - 74%    Lower Body Dressing/Undressing Lower body dressing            Lower body assist Assist for lower body dressing: Maximal Assistance - Patient 25 - 49%     Toileting Toileting    Toileting assist Assist for toileting: Maximal Assistance - Patient 25 - 49%     Transfers Chair/bed transfer  Transfers assist     Chair/bed transfer assist level: 2 Helpers     Locomotion Ambulation   Ambulation assist   Ambulation activity did not occur: Safety/medical concerns  Assist level: 2 helpers Assistive device:  (handrail) Max distance: 15 feet   Walk 10 feet activity   Assist  Walk 10 feet activity did not occur: Safety/medical concerns  Assist level: 2 helpers Assistive device:  (handrail)   Walk 50 feet activity   Assist Walk 50 feet with 2 turns activity did not occur: Safety/medical concerns         Walk 150 feet activity   Assist Walk 150 feet activity did not occur: Safety/medical concerns         Walk 10 feet on uneven surface  activity   Assist Walk 10 feet on uneven surfaces activity did not occur: Safety/medical concerns         Wheelchair     Assist Is the patient using a wheelchair?: Yes Type of Wheelchair: Manual    Wheelchair assist level: Minimal Assistance - Patient > 75% Max wheelchair distance: 50    Wheelchair 50 feet with 2 turns activity    Assist        Assist Level:  Minimal Assistance - Patient > 75%   Wheelchair 150 feet activity     Assist  Assist Level: Maximal Assistance - Patient 25 - 49%   Blood pressure 119/84, pulse 79, temperature 98.2 F (36.8 C), temperature source Oral, resp. rate 17, height 5\' 9"  (1.753 m), weight 91.5 kg, SpO2 98%.  Medical Problem List and Plan: 1. 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  Await plan for loop recorder             -patient may shower -ELOS/Goals: 21-25 days, min assist with PT, OT and supervision with SLP -Continue CIR 2.  Antithrombotics: -DVT/anticoagulation:  Pharmaceutical: Heparin  5000U q8h -antiplatelet therapy: Aspirin  81 mg daily and Plavix  75 mg day x 3 weeks then Plavix  alone 3. Pain Management: Lidoderm  patch as directed, Tylenol  as needed 4. Mood/Sleep/sundowning: Effexor  150 mg every evening, provide emotional support             -antipsychotic agents: Seroquel  50 mg nighty at 2030, have back up dose             -keep sleep chart 5. Neuropsych/cognition: This patient is capable of making decisions on his own behalf. 6. Skin/Wound Care: Routine skin checks 7. Fluids/Electrolytes/Nutrition: Routine in and outs with follow-up chemistries, continue vitamins/supplements 8.  Hypothyroidism.  Synthroid  26mcg/day 9.  Hypertension. Norvasc  10 mg daily, Toprol -Xl 100 mg daily, Aldactone  25 mg daily.             -bp trending up. Likely will need additions to regimen  -02/22/23 BPs 140s/90s, not terrible, monitor trend with increase mobility  2/10 BP controlled, decrease spironolactone  due to elevated CR/BUN  Vitals:   02/21/23 1441 02/21/23 1924 02/22/23 0400 02/22/23 1320  BP: (!) 142/93 (!) 145/94 (!) 144/92 135/88   02/22/23 1911 02/23/23 0404 02/23/23 0938 02/23/23 0949  BP: 121/87 129/78 131/83 122/86   02/23/23 1428  BP: 119/84    10.H/O prostate cancer. Myrbetriq  50 mg daily. 11.Hyperlipidemia.  Lipitor  80mg  daily 12.Constipation.             -miralax  daily, SenokotS 1 tab daily -02/22/23 LBM 2days ago, monitor today, if no BM tomorrow then may want to increase regimen -2/10 LBM today 13.H/O gout. Zyloprim  300mg  daily 14.  AKI versus CKD  -Encourage oral fluid intake, decrease spironolactone  to 12.5 mg 15.  History of CHF.  Last EF 55%  -Continue metoprolol , decrease spironolactone  to 12.5 mg as above 16.  Severe cervical stenosis  -Consider neurosurgery outpatient follow-up 17. Insomnia  -Patient has Seroquel  50 mg scheduled and 25 mg as needed.  Will also add in 3 mg melatonin at bedtime  LOS: 2 days A FACE TO FACE EVALUATION WAS PERFORMED  Lylia Sand 02/23/2023, 4:17 PM

## 2023-02-23 NOTE — Plan of Care (Signed)
  Problem: RH Problem Solving Goal: LTG Patient will demonstrate problem solving for (SLP) Description: LTG:  Patient will demonstrate problem solving for basic/complex daily situations with cues  (SLP) Flowsheets (Taken 02/23/2023 1056) LTG: Patient will demonstrate problem solving for (SLP): Basic daily situations LTG Patient will demonstrate problem solving for: Minimal Assistance - Patient > 75%   Problem: RH Memory Goal: LTG Patient will demonstrate ability for day to day (SLP) Description: LTG:   Patient will demonstrate ability for day to day recall/carryover during cognitive/linguistic activities with assist  (SLP) Flowsheets (Taken 02/23/2023 1056) LTG: Patient will demonstrate ability for day to day recall: New information LTG: Patient will demonstrate ability for day to day recall/carryover during cognitive/linguistic activities with assist (SLP): Minimal Assistance - Patient > 75%   Problem: RH Awareness Goal: LTG: Patient will demonstrate awareness during functional activites type of (SLP) Description: LTG: Patient will demonstrate awareness during functional activites type of (SLP) Flowsheets (Taken 02/23/2023 1056) Patient will demonstrate during cognitive/linguistic activities awareness type of: Intellectual LTG: Patient will demonstrate awareness during cognitive/linguistic activities with assistance of (SLP): Minimal Assistance - Patient > 75%

## 2023-02-23 NOTE — Progress Notes (Signed)
 Occupational Therapy Session Note  Patient Details  Name: Edward Rodgers MRN: [id number] Date of Birth: [birth year]  Today's Date: 02/23/2023 OT Individual Time: 0950-1100 OT Individual Time Calculation (min): 70 min   Short Term Goals: Week 1:  OT Short Term Goal 1 (Week 1): Pt will attend to L hemibody with Mod levels of cuing. OT Short Term Goal 2 (Week 1): Pt will perform BSC/toilet transfer with Mod A. OT Short Term Goal 3 (Week 1): Pt with threading LB garments with supervision.  Skilled Therapeutic Interventions/Progress Updates:   Pt received resting in bed, post fall out of WC, verified no hold on therapy with RN/PA. Pt comes to EOB with Mod-Max A, HOB slightly elevated. From elevated bed, pt performs x5 sit<>stands with overall Max A + HDRW (now equipped with saddle splint). Pt requires cuing for weight-shifting and hand placement. Pt tolerates 1-2 mins of standing with Mod A (Min-CGA intermediately), heavy multimodal cuing to correct L lateral lean, dependent for placement of LLE into wider base of support. Squat-pivot from EOB>WC with Mod A (+2 Min A). Stedy used for toilet transfer, remained in care of NT as OT replaces WC/cushion for appropriate sitting and decreased fall risk. Pt does demo increased disorientation to time and place, stating ". . . I thought it was 2 or 3 in the evening" and asking " . . . do I have to spend the night here." LPN/PA updated.   Therapy Documentation Precautions:  Precautions Precautions: Fall Precaution Comments: L-hemi, L-inattention, pusher syndrome, Loop recorder placed 2/7 Restrictions Weight Bearing Restrictions Per Provider Order: No   Therapy/Group: Individual Therapy  Artemus Biles, OTR/L, MSOT  02/23/2023, 6:35 AM

## 2023-02-23 NOTE — Plan of Care (Signed)
  Problem: Consults Goal: RH STROKE PATIENT EDUCATION Description: See Patient Education module for education specifics  Outcome: Progressing   Problem: RH BOWEL ELIMINATION Goal: RH STG MANAGE BOWEL WITH ASSISTANCE Description: STG Manage Bowel with min Assistance. Outcome: Progressing Goal: RH STG MANAGE BOWEL W/MEDICATION W/ASSISTANCE Description: STG Manage Bowel with Medication with mod I  Assistance. Outcome: Progressing   Problem: RH BLADDER ELIMINATION Goal: RH STG MANAGE BLADDER WITH ASSISTANCE Description: STG Manage Bladder With min Assistance Outcome: Progressing Goal: RH STG MANAGE BLADDER WITH MEDICATION WITH ASSISTANCE Description: STG Manage Bladder With Medication With mod I Assistance. Outcome: Progressing   Problem: RH SKIN INTEGRITY Goal: RH STG SKIN FREE OF INFECTION/BREAKDOWN Description: Manage skin w min assist Outcome: Progressing Goal: RH STG MAINTAIN SKIN INTEGRITY WITH ASSISTANCE Description: STG Maintain Skin Integrity With min Assistance. Outcome: Progressing   Problem: RH SAFETY Goal: RH STG ADHERE TO SAFETY PRECAUTIONS W/ASSISTANCE/DEVICE Description: STG Adhere to Safety Precautions With cues  Assistance/Device. Outcome: Progressing   Problem: RH COGNITION-NURSING Goal: RH STG USES MEMORY AIDS/STRATEGIES W/ASSIST TO PROBLEM SOLVE Description: STG Uses Memory Aids/Strategies With cues Assistance to Problem Solve. Outcome: Progressing   Problem: RH KNOWLEDGE DEFICIT Goal: RH STG INCREASE KNOWLEDGE OF DIABETES Description: Patient and wife will be able to manage prediabetes using educational resources for medications and dietary modification independently Outcome: Progressing Goal: RH STG INCREASE KNOWLEDGE OF HYPERTENSION Description: Patient and wife will be able to manage HTN using educational resources for medications and dietary modification independently Outcome: Progressing Goal: RH STG INCREASE KNOWLEGDE OF  HYPERLIPIDEMIA Description: Patient and wife will be able to manage HLD using educational resources for medications and dietary modification independently Outcome: Progressing Goal: 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

## 2023-02-24 DIAGNOSIS — N4889 Other specified disorders of penis: Secondary | ICD-10-CM

## 2023-02-24 DIAGNOSIS — I63531 Cerebral infarction due to unspecified occlusion or stenosis of right posterior cerebral artery: Secondary | ICD-10-CM | POA: Diagnosis not present

## 2023-02-24 DIAGNOSIS — G4700 Insomnia, unspecified: Secondary | ICD-10-CM | POA: Diagnosis not present

## 2023-02-24 DIAGNOSIS — Z8679 Personal history of other diseases of the circulatory system: Secondary | ICD-10-CM | POA: Diagnosis not present

## 2023-02-24 LAB — CBC WITH DIFFERENTIAL/PLATELET
Abs Immature Granulocytes: 0.04 10*3/uL (ref 0.00–0.07)
Basophils Absolute: 0.1 10*3/uL (ref 0.0–0.1)
Basophils Relative: 1 %
Eosinophils Absolute: 0.2 10*3/uL (ref 0.0–0.5)
Eosinophils Relative: 4 %
HCT: 40.2 % (ref 39.0–52.0)
Hemoglobin: 13 g/dL (ref 13.0–17.0)
Immature Granulocytes: 1 %
Lymphocytes Relative: 24 %
Lymphs Abs: 1.3 10*3/uL (ref 0.7–4.0)
MCH: 28.1 pg (ref 26.0–34.0)
MCHC: 32.3 g/dL (ref 30.0–36.0)
MCV: 86.8 fL (ref 80.0–100.0)
Monocytes Absolute: 0.6 10*3/uL (ref 0.1–1.0)
Monocytes Relative: 11 %
Neutro Abs: 3.1 10*3/uL (ref 1.7–7.7)
Neutrophils Relative %: 59 %
Platelets: 189 10*3/uL (ref 150–400)
RBC: 4.63 MIL/uL (ref 4.22–5.81)
RDW: 14.3 % (ref 11.5–15.5)
WBC: 5.3 10*3/uL (ref 4.0–10.5)
nRBC: 0 % (ref 0.0–0.2)

## 2023-02-24 LAB — BASIC METABOLIC PANEL
Anion gap: 11 (ref 5–15)
BUN: 39 mg/dL — ABNORMAL HIGH (ref 8–23)
CO2: 21 mmol/L — ABNORMAL LOW (ref 22–32)
Calcium: 10 mg/dL (ref 8.9–10.3)
Chloride: 102 mmol/L (ref 98–111)
Creatinine, Ser: 1.73 mg/dL — ABNORMAL HIGH (ref 0.61–1.24)
GFR, Estimated: 42 mL/min — ABNORMAL LOW (ref >60)
Glucose, Bld: 134 mg/dL — ABNORMAL HIGH (ref 70–99)
Potassium: 4.2 mmol/L (ref 3.5–5.1)
Sodium: 134 mmol/L — ABNORMAL LOW (ref 135–145)

## 2023-02-24 LAB — URINALYSIS, ROUTINE W REFLEX MICROSCOPIC
Bilirubin Urine: NEGATIVE
Glucose, UA: NEGATIVE mg/dL
Hgb urine dipstick: NEGATIVE
Ketones, ur: NEGATIVE mg/dL
Leukocytes,Ua: NEGATIVE
Nitrite: NEGATIVE
Protein, ur: NEGATIVE mg/dL
Specific Gravity, Urine: 1.014 (ref 1.005–1.030)
pH: 5 (ref 5.0–8.0)

## 2023-02-24 MED ORDER — QUETIAPINE FUMARATE 50 MG PO TABS
75.0000 mg | ORAL_TABLET | Freq: Every day | ORAL | Status: DC
  Administered 2023-02-24 – 2023-03-04 (×9): 75 mg via ORAL
  Filled 2023-02-24 (×9): qty 1

## 2023-02-24 MED ORDER — SODIUM CHLORIDE 0.9 % IV SOLN: INTRAVENOUS | Status: DC

## 2023-02-24 NOTE — Progress Notes (Addendum)
Physical Therapy Session Note  Patient Details  Name: Edward Rodgers MRN: [id number] Date of Birth: 01-Dec-1950  Today's Date: 02/24/2023 PT Individual Time: 0820-0910 PT Individual Time Calculation (min): 50 min  PT missed time: 20 min-pt eating  Short Term Goals: Week 1:  PT Short Term Goal 1 (Week 1): pt will perfrom sit to stand with LRAD and mod A PT Short Term Goal 2 (Week 1): pt will perform bed to chair transfer with LRAD and mod A PT Short Term Goal 3 (Week 1): Pt will perform bed mobility with LRAD and mod A PT Short Term Goal 4 (Week 1): pt will ambulate 50 feet with LRAD and +2 A  Skilled Therapeutic Interventions/Progress Updates:      Treatment Session 1  Pt seated in recliner upon arrival eating breakfast. Pt agreeable to therapy. Pt denies any pain. Pt missed 20 minutes 2/2 eating breakfast. Pt reports fatigue for poor sleep last night, notified MD during morning rounds. Pt unable to recall what kept him up.   Pt demos poor recall of session yesterday, however recalls "walking down hallway" with cues from therapist.   Pt performed mass practice of squat pivot transfer today recliner to WC, and WC to mat table, with max instructional progressing to max questioning/instructional cues for technique. Pt demos improved recall of anterior weight shift throughout session. Pt initially requiring +2 total A, progress to +1 heavy mod-max A.   Pt performed seated reaching with R UE x10 across body and to R with visual/verbal and tactile cues for return to midline after each rep.   Pt demos improved L LE dorsiflexion today 2/5, reviewed and performed HEP, education provided to perform while seated in recliner when not in therapy to improve L LE ROM/strength/attention/coordination. Pt brother present, education provided on pt memory deficits and encouraged him to remind pt to perform. Therapist provided handout and left on table in front of pt as additional reminder to pt.    1x15  ankle pumps  1x15 quad sets  1x15 glute sets  1x15 long sitting hip abduction--pt able to achieve 1/5 strength  Pt seated in WC with B LE elevated seatbelt alarm on and needs within reach.           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 Restrictions Weight Bearing Restrictions Per Provider Order: No  Therapy/Group: Individual Therapy  [facility] Ambrose [place], [place], DPT  02/24/2023, 7:33 AM

## 2023-02-24 NOTE — Progress Notes (Signed)
Speech Language Pathology Daily Session Note  Patient Details  Name: Edward Rodgers MRN: [id number] Date of Birth: November 15, 1950  Today's Date: 02/24/2023 SLP Individual Time: 1036-1130 SLP Individual Time Calculation (min): 54 min  Short Term Goals: Week 1: SLP Short Term Goal 1 (Week 1): Patient will solve basic environmental and functional problems with 80% accuracy given max assist. SLP Short Term Goal 2 (Week 1): Patient will utilize external and internal memory aids to recall daily information with 75% accuracy given max assist. SLP Short Term Goal 3 (Week 1): Patient will demonstrate awareness of deficits by stating 2 cognitive and 2 physical deficits when provided with max verbal cues.  Skilled Therapeutic Interventions: SLP conducted skilled therapy session targeting cognitive retraining goals. [name] present throughout session. Patient somnolent during today's session and required frequent cues to remain alert. Demonstrated significant impairment to sustained attention, requiring max to total verbal cues during all tasks to remain oriented to task parameters and oriented to task progress. He states 2 cognitive deficits this session given supervision assist, showing improvement compared to evaluation. Requires max assist for memory of specific daily events and max to total verbal cues for schedule interpretation. Patient was left in lowered bed with call bell in reach and bed alarm set. SLP will continue to target goals per plan of care.       Pain Pain Assessment Pain Scale: 0-10 Pain Score: 0-No pain  Therapy/Group: Individual Therapy  [name], M.A., CCC-SLP  [name] 02/24/2023, 12:41 PM

## 2023-02-24 NOTE — IPOC Note (Signed)
Overall Plan of Care [facility]) Patient Details Name: Edward Rodgers MRN: [id number] DOB: 1950/12/26  Admitting Diagnosis: Acute right PCA stroke [facility])  Hospital Problems: Principal Problem:   Acute right PCA stroke [facility])     Functional Problem List: Nursing Bladder, Bowel, Safety, Pain, Endurance, Medication Management  PT Balance, Edema, Endurance, Motor, Perception, Safety, Sensory  OT Balance, Cognition, Edema, Endurance, Motor, Pain, Perception, Safety, Sensory, Vision  SLP Cognition, Safety  TR         Basic ADL's: OT Eating, Grooming, Bathing, Dressing, Toileting     Advanced  ADL's: OT       Transfers: PT Bed Mobility, Bed to Chair, Car, Occupational psychologist, Research scientist (life sciences): PT Ambulation, Psychologist, prison and probation services, Stairs     Additional Impairments: OT Fuctional Use of Upper Extremity  SLP Social Cognition   Problem Solving, Memory, Attention, Awareness  TR      Anticipated Outcomes Item Anticipated Outcome  Self Feeding Mod I  Swallowing      Basic self-care  Min A  Toileting  Min A   Bathroom Transfers Min A  Bowel/Bladder  manage bowel and bladder w mod I assist  Transfers  min A  Locomotion  min A  Communication     Cognition  min assist  Pain  Pain < 4 with prns  Safety/Judgment  Manage safety w cues   Therapy Plan: PT Intensity: Minimum of 1-2 x/day ,45 to 90 minutes PT Frequency: 5 out of 7 days PT Duration Estimated Length of Stay: 17-21 days OT Intensity: Minimum of 1-2 x/day, 45 to 90 minutes OT Frequency: 5 out of 7 days OT Duration/Estimated Length of Stay: 17-21 days SLP Intensity: Minumum of 1-2 x/day, 30 to 90 minutes SLP Frequency: 3 to 5 out of 7 days SLP Duration/Estimated Length of Stay: 17-21 days   Team Interventions: Nursing Interventions Bladder Management, Bowel Management, Disease Management/Prevention, Medication Management, Discharge Planning, Pain Management, Patient/Family Education  PT interventions  Ambulation/gait training, Community reintegration, DME/adaptive equipment instruction, Neuromuscular re-education, Psychosocial support, Stair training, UE/LE Strength taining/ROM, Wheelchair propulsion/positioning, Warden/ranger, Discharge planning, Functional electrical stimulation, Pain management, Skin care/wound management, Therapeutic Activities, UE/LE Coordination activities, Cognitive remediation/compensation, Disease management/prevention, Functional mobility training, Patient/family education, Splinting/orthotics, Therapeutic Exercise, Visual/perceptual remediation/compensation  OT Interventions Warden/ranger, Cognitive remediation/compensation, Community reintegration, Discharge planning, Disease mangement/prevention, DME/adaptive equipment instruction, Functional electrical stimulation, Functional mobility training, Neuromuscular re-education, Pain management, Patient/family education, Psychosocial support, Self Care/advanced ADL retraining, Skin care/wound managment, Splinting/orthotics, Therapeutic Activities, Therapeutic Exercise, UE/LE Strength taining/ROM, UE/LE Coordination activities, Visual/perceptual remediation/compensation, Wheelchair propulsion/positioning  SLP Interventions Cognitive remediation/compensation, Environmental controls, Cueing hierarchy, Functional tasks, Therapeutic Activities, Internal/external aids, Patient/family education  TR Interventions    SW/CM Interventions Discharge Planning, Psychosocial Support, Patient/Family Education   Barriers to Discharge MD  Medical stability  Nursing Decreased caregiver support, Home environment access/layout 2 level B/B on main, 1 ste w spouse; previous cerebellar stroke w diplopia;mod I for ADL and medication management PTA uses cane ocassionally  PT Inaccessible home environment, Decreased caregiver support, Home environment access/layout, Incontinence, Lack of/limited family support, Weight    OT  Lack of/limited family support, Weight    SLP      SW Insurance for SNF coverage, Lack of/limited family support     Team Discharge Planning: Destination: PT-Home ,OT- Home , SLP-Home Projected Follow-up: PT-Home health PT, OT-  Home health OT, SLP-Outpatient SLP, Home Health SLP, 24 hour supervision/assistance Projected Equipment Needs: PT-To be determined, OT- To be  determined, SLP-None recommended by SLP Equipment Details: PT- , OT-  Patient/family involved in discharge planning: PT- Patient,  OT-Patient, Family member/caregiver, SLP-Patient  MD ELOS: 17-21 Medical Rehab Prognosis:  Good Assessment: The patient has been admitted for CIR therapies with the diagnosis of 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 The team will be addressing functional mobility, strength, stamina, balance, safety, adaptive techniques and equipment, self-care, bowel and bladder mgt, patient and caregiver education. Goals have been set at min A. Anticipated discharge destination is home.        See Team Conference Notes for weekly updates to the plan of care

## 2023-02-24 NOTE — Progress Notes (Signed)
Occupational Therapy Session Note  Patient Details  Name: Edward Rodgers MRN: [id number] Date of Birth: 07-Mar-1950  {CHL IP REHAB OT TIME CALCULATIONS:[id number]}   Short Term Goals: Week 1:  OT Short Term Goal 1 (Week 1): Pt will attend to L hemibody with Mod levels of cuing. OT Short Term Goal 2 (Week 1): Pt will perform BSC/toilet transfer with Mod A. OT Short Term Goal 3 (Week 1): Pt with threading LB garments with supervision.  Skilled Therapeutic Interventions/Progress Updates:  Pt greeted *** for skilled OT session with focus on ***.   Pain: Pt reported ***/10 pain, stating "***" in reference to ***. OT offering intermediate rest breaks and positioning suggestions throughout session to address pain/fatigue and maximize participation/safety in session.   Functional Transfers:  Self Care Tasks: Pt completes the following self care tasks with levels of assistance noted below, UB: LB:   Therapeutic Activities:  Therapeutic Exercise:   Education:  Pt remained *** with 4Ps assessed and immediate needs met. Pt continues to be appropriate for skilled OT intervention to promote further functional independence in ADLs/IADLs.   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 Restrictions Weight Bearing Restrictions Per Provider Order: No   Therapy/Group: Individual Therapy  Lou Cal, OTR/L, MSOT  02/24/2023, 8:39 PM

## 2023-02-24 NOTE — Progress Notes (Addendum)
PROGRESS NOTE   Subjective/Complaints:  Edward Rodgers reports he had poor sleep again last night.  Does not appear he got with his as needed Seroquel.  Nursing yesterday noted small amount of blood smeared on his briefs and it was felt to be coming from the head of his penis.  Patient was not able to provide additional details regarding this event this morning.  He denied any further bleeding.  ROS: as per HPI. Denies CP, shortness of breath abd pain, N/V/D, or any other complaints at this time.   Constipation-improved   Objective:   No results found. Recent Labs    02/21/23 1534 02/23/23 0507  WBC 6.7 5.6  HGB 13.0 12.7*  HCT 40.6 39.7  PLT 207 189   Recent Labs    02/21/23 1534 02/23/23 0507  NA  --  138  K  --  4.3  CL  --  97*  CO2  --  22  GLUCOSE  --  103*  BUN  --  29*  CREATININE 1.22 1.38*  CALCIUM  --  10.6*        Intake/Output Summary (Last 24 hours) at 02/24/2023 0949 Last data filed at 02/24/2023 0844 Gross per 24 hour  Intake 160 ml  Output --  Net 160 ml     Pressure Injury 02/16/23 Buttocks Right Stage 2 -  Partial thickness loss of dermis presenting as a shallow open injury with a red, pink wound bed without slough. pink, red (Active)  02/16/23 1420  Location: Buttocks  Location Orientation: Right  Staging: Stage 2 -  Partial thickness loss of dermis presenting as a shallow open injury with a red, pink wound bed without slough.  Wound Description (Comments): pink, red  Present on Admission: Yes    Physical Exam: Vital Signs Blood pressure 107/83, pulse 75, temperature 97.9 F (36.6 C), resp. rate 16, height 5\' 9"  (1.753 m), weight 91.5 kg, SpO2 95%.  Constitutional: NAD, working in the gym with therapy HENT:     Head: Normocephalic and atraumatic.     Nose: Nose normal.     Mouth/Throat:     Mouth: Mucous membranes are moist.  Eyes:     Extraocular Movements: Extraocular movements  intact.     Conjunctiva/sclera: Conjunctivae normal.     Pupils: Pupils are equal, round, and reactive to light.  Cardiovascular:     Rate and Rhythm: Normal rate and regular rhythm.     Heart sounds: Murmur heard.  Pulmonary:     Effort: Pulmonary effort is normal. No respiratory distress.     Breath sounds: No stridor. No wheezing or rhonchi.  Abdominal:     General: Bowel sounds are normal. There is no distension.     Palpations: Abdomen is soft.     Tenderness: There is no abdominal tenderness.  Musculoskeletal:        General: Swelling (trace LUE) present.     Cervical back: Normal range of motion.      No pedal edema appreciated Skin:    General: Skin is warm and dry.     Findings: No bruising or erythema.  Psychiatric:     Comments: Pt pleasant  and cooperative   Neurological:     Mental Status: He is alert.     Comments: Alert and oriented x 4.  Dysarthric speech.  Fair insight and awareness.  Language overall appears to be intact.  Appears to be subjectively slightly more confused today than yesterday.  Left central 7.  Decreased sensation to light touch in left upper extremity and left lower extremity.  Moving right upper extremity and right lower extremity to gravity.  Prior Exam Pt is alert. Oriented to person, place, month/year. Knew why he was here. Told me who he was rooting for in [employer] tomorrow. Was a little drowsy, nodded off a few times during my visit. Speech dysarthric. Language appears intact. Fair insight and awareness. Left central 7 and tongue deviation. LUE 1/5 prox to distal. LLLE 1 to 1+/5 prox to distal. RUE 4/5, RLE 4- to 4/5. Decreased LT and pain in left leg and arm as wel. DTR's 2+ on left. Toes up. Heel cord a little tight, wearing PRAFO. No resting tone appreciated.    Assessment/Plan: 1. Functional deficits which require 3+ hours per day of interdisciplinary therapy in a comprehensive inpatient rehab setting. Physiatrist is providing close team  supervision and 24 hour management of active medical problems listed below. Physiatrist and rehab team continue to assess barriers to discharge/monitor patient progress toward functional and medical goals  Care Tool:  Bathing    Body parts bathed by patient: Left arm, Chest, Abdomen, Front perineal area, Right upper leg, Left upper leg, Right lower leg, Face   Body parts bathed by helper: Right arm, Buttocks, Left lower leg     Bathing assist Assist Level: Moderate Assistance - Patient 50 - 74%     Upper Body Dressing/Undressing Upper body dressing        Upper body assist Assist Level: Moderate Assistance - Patient 50 - 74%    Lower Body Dressing/Undressing Lower body dressing            Lower body assist Assist for lower body dressing: Maximal Assistance - Patient 25 - 49%     Toileting Toileting    Toileting assist Assist for toileting: Maximal Assistance - Patient 25 - 49%     Transfers Chair/bed transfer  Transfers assist     Chair/bed transfer assist level: 2 Helpers     Locomotion Ambulation   Ambulation assist   Ambulation activity did not occur: Safety/medical concerns  Assist level: 2 helpers Assistive device:  (handrail) Max distance: 15 feet   Walk 10 feet activity   Assist  Walk 10 feet activity did not occur: Safety/medical concerns  Assist level: 2 helpers Assistive device:  (handrail)   Walk 50 feet activity   Assist Walk 50 feet with 2 turns activity did not occur: Safety/medical concerns         Walk 150 feet activity   Assist Walk 150 feet activity did not occur: Safety/medical concerns         Walk 10 feet on uneven surface  activity   Assist Walk 10 feet on uneven surfaces activity did not occur: Safety/medical concerns         Wheelchair     Assist Is the patient using a wheelchair?: Yes Type of Wheelchair: Manual    Wheelchair assist level: Minimal Assistance - Patient > 75% Max wheelchair  distance: 50    Wheelchair 50 feet with 2 turns activity    Assist        Assist Level: Minimal  Assistance - Patient > 75%   Wheelchair 150 feet activity     Assist      Assist Level: Maximal Assistance - Patient 25 - 49%   Blood pressure 107/83, pulse 75, temperature 97.9 F (36.6 C), resp. rate 16, height 5\' 9"  (1.753 m), weight 91.5 kg, SpO2 95%.  Medical Problem List and Plan: 1. 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  Await plan for loop recorder             -patient may shower -ELOS/Goals: 21-25 days, min assist with PT, OT and supervision with SLP -Continue CIR -Team conference tomorrow 2.  Antithrombotics: -DVT/anticoagulation:  Pharmaceutical: Heparin 5000U q8h -antiplatelet therapy: Aspirin 81 mg daily and Plavix 75 mg day x 3 weeks then Plavix alone 3. Pain Management: Lidoderm patch as directed, Tylenol as needed 4. Mood/Sleep/sundowning: Effexor 150 mg every evening, provide emotional support             -antipsychotic agents: Seroquel 50 mg nighty at 2030, have back up dose             -keep sleep chart 5. Neuropsych/cognition: This patient is capable of making decisions on his own behalf. 6. Skin/Wound Care: Routine skin checks 7. Fluids/Electrolytes/Nutrition: Routine in and outs with follow-up chemistries, continue vitamins/supplements 8.  Hypothyroidism.  Synthroid 102mcg/day 9.  Hypertension. Norvasc 10 mg daily, Toprol-Xl 100 mg daily, Aldactone 25 mg daily.             -bp trending up. Likely will need additions to regimen  -02/22/23 BPs 140s/90s, not terrible, monitor trend with increase mobility  2/10 BP controlled, decrease spironolactone due to elevated CR/BUN  2/11 BP controlled overall, continue current regimen  Vitals:   02/21/23 1441 02/21/23 1924 02/22/23 0400 02/22/23 1320  BP: (!) 142/93 (!) 145/94 (!) 144/92 135/88   02/22/23 1911 02/23/23 0404  02/23/23 0938 02/23/23 0949  BP: 121/87 129/78 131/83 122/86   02/23/23 1428 02/23/23 2023 02/24/23 0450  BP: 119/84 (!) 140/87 107/83    10.H/O prostate cancer. Myrbetriq 50 mg daily. 11.Hyperlipidemia. Lipitor 80mg  daily 12.Constipation.             -miralax daily, SenokotS 1 tab daily -02/22/23 LBM 2days ago, monitor today, if no BM tomorrow then may want to increase regimen -2/11 LBM yesterday, patient reports constipation is improved 13.H/O gout. Zyloprim 300mg  daily 14.  AKI versus CKD  -Encourage oral fluid intake, decrease spironolactone to 12.5 mg  -2/11 recheck BMP today  Addendum- increased Cr, stop spironolactone, start IVF NS 33ml/HR 15.  History of CHF.  Last EF 55%  -Continue metoprolol, decrease spironolactone to 12.5 mg as above  -Check daily weights  Filed Weights   02/21/23 1441 02/22/23 0449 02/23/23 0505  Weight: 91.6 kg 91.6 kg 91.5 kg    16.  Severe cervical stenosis  -Consider neurosurgery outpatient follow-up 17. Insomnia  -Patient has Seroquel 50 mg scheduled and 25 mg as needed.  Will also add in 3 mg melatonin at bedtime  -2/11 increase Seroquel to 75 mg nightly  18.  Bleeding from his penis  -UA pending.  Recheck CBC  LOS: 3 days A FACE TO FACE EVALUATION WAS PERFORMED  Fanny Dance 02/24/2023, 9:49 AM

## 2023-02-24 NOTE — Progress Notes (Signed)
Occupational Therapy Session Note  Patient Details  Name: [name] MRN: [id number] Date of Birth: [birth year]  Today's Date: 02/24/2023 OT Individual Time: 1420-1530 OT Individual Time Calculation (min): 70 min   Short Term Goals: Week 1:  OT Short Term Goal 1 (Week 1): Pt will attend to L hemibody with Mod levels of cuing. OT Short Term Goal 2 (Week 1): Pt will perform BSC/toilet transfer with Mod A. OT Short Term Goal 3 (Week 1): Pt with threading LB garments with supervision.  Skilled Therapeutic Interventions/Progress Updates:  Pt greeted resting in bed for skilled OT session with focus on functional transfers, static standing balance (including midline orientation) and LLE NMR.   Pain: Pt with no reports of pain, OT offering intermediate rest breaks and positioning suggestions throughout session to address potential pain/fatigue and maximize participation/safety in session.   Functional Transfers: Supine>sit EOB with Mod A + HOB elevated. Squat-pivot from EOB<>WC and WC<>EOM with Mod A (+1 providing CGA-light Min A).   Therapeutic Activities: Carley Hammed walker utilized this session for increased UE support (and proprioceptive input) in static standing, x3 Sit<>stands, pt tolerating static standing for ~2 mins, multimodal cuing midline orientation and to prevent L lateral lean. Series of sit<>stands then completed with no UE, OT providing blocking at L-knee to prevent hyperextension/buckling. Pt's RLE placed on 6-in step for increased WB, x3 STS, overall Mod A for the above, up to Mod-Max A for correction of L lateral lean, benefiting from visual/mirror feedback.   Pt remained resting in bed with 4Ps assessed and immediate needs met. Pt continues to be appropriate for skilled OT intervention to promote further functional independence in ADLs/IADLs.   Therapy Documentation Precautions:  Precautions Precautions: Fall Precaution Comments: L-hemi, L-inattention, pusher syndrome, Loop  recorder placed 2/7 Restrictions Weight Bearing Restrictions Per Provider Order: No   Therapy/Group: Individual Therapy  Lou Cal, OTR/L, MSOT  02/24/2023, 6:34 AM

## 2023-02-25 DIAGNOSIS — Z8679 Personal history of other diseases of the circulatory system: Secondary | ICD-10-CM | POA: Diagnosis not present

## 2023-02-25 DIAGNOSIS — G4700 Insomnia, unspecified: Secondary | ICD-10-CM | POA: Diagnosis not present

## 2023-02-25 DIAGNOSIS — I63531 Cerebral infarction due to unspecified occlusion or stenosis of right posterior cerebral artery: Secondary | ICD-10-CM | POA: Diagnosis not present

## 2023-02-25 DIAGNOSIS — N4889 Other specified disorders of penis: Secondary | ICD-10-CM | POA: Diagnosis not present

## 2023-02-25 LAB — BASIC METABOLIC PANEL
Anion gap: 14 (ref 5–15)
BUN: 36 mg/dL — ABNORMAL HIGH (ref 8–23)
CO2: 18 mmol/L — ABNORMAL LOW (ref 22–32)
Calcium: 9.1 mg/dL (ref 8.9–10.3)
Chloride: 101 mmol/L (ref 98–111)
Creatinine, Ser: 1.64 mg/dL — ABNORMAL HIGH (ref 0.61–1.24)
GFR, Estimated: 44 mL/min — ABNORMAL LOW (ref >60)
Glucose, Bld: 89 mg/dL (ref 70–99)
Potassium: 3.8 mmol/L (ref 3.5–5.1)
Sodium: 133 mmol/L — ABNORMAL LOW (ref 135–145)

## 2023-02-25 MED ORDER — FLUTICASONE PROPIONATE 50 MCG/ACT NA SUSP
1.0000 | Freq: Every day | NASAL | Status: DC
  Administered 2023-02-25 – 2023-03-28 (×32): 1 via NASAL
  Filled 2023-02-25 (×2): qty 16

## 2023-02-25 NOTE — Plan of Care (Signed)
  Problem: Consults Goal: RH STROKE PATIENT EDUCATION Description: See Patient Education module for education specifics  Outcome: Progressing   Problem: RH BOWEL ELIMINATION Goal: RH STG MANAGE BOWEL WITH ASSISTANCE Description: STG Manage Bowel with min Assistance. Outcome: Progressing Goal: RH STG MANAGE BOWEL W/MEDICATION W/ASSISTANCE Description: STG Manage Bowel with Medication with mod I  Assistance. Outcome: Progressing   Problem: RH BLADDER ELIMINATION Goal: RH STG MANAGE BLADDER WITH ASSISTANCE Description: STG Manage Bladder With min Assistance Outcome: Progressing Goal: RH STG MANAGE BLADDER WITH MEDICATION WITH ASSISTANCE Description: STG Manage Bladder With Medication With mod I Assistance. Outcome: Progressing   Problem: RH SKIN INTEGRITY Goal: RH STG SKIN FREE OF INFECTION/BREAKDOWN Description: Manage skin w min assist Outcome: Progressing Goal: RH STG MAINTAIN SKIN INTEGRITY WITH ASSISTANCE Description: STG Maintain Skin Integrity With min Assistance. Outcome: Progressing   Problem: RH SAFETY Goal: RH STG ADHERE TO SAFETY PRECAUTIONS W/ASSISTANCE/DEVICE Description: STG Adhere to Safety Precautions With cues  Assistance/Device. Outcome: Progressing   Problem: RH COGNITION-NURSING Goal: RH STG USES MEMORY AIDS/STRATEGIES W/ASSIST TO PROBLEM SOLVE Description: STG Uses Memory Aids/Strategies With cues Assistance to Problem Solve. Outcome: Progressing   Problem: RH KNOWLEDGE DEFICIT Goal: RH STG INCREASE KNOWLEDGE OF DIABETES Description: Patient and wife will be able to manage prediabetes using educational resources for medications and dietary modification independently Outcome: Progressing Goal: RH STG INCREASE KNOWLEDGE OF HYPERTENSION Description: Patient and wife will be able to manage HTN using educational resources for medications and dietary modification independently Outcome: Progressing Goal: RH STG INCREASE KNOWLEGDE OF  HYPERLIPIDEMIA Description: Patient and wife will be able to manage HLD using educational resources for medications and dietary modification independently Outcome: Progressing Goal: 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

## 2023-02-25 NOTE — Progress Notes (Addendum)
Patient ID: Edward Rodgers, male   DOB: Dec 02, 1950, 74 y.o.   MRN: [id number]  Left message for Edward Rodgers to give team conference update and will await return call. Pt feels he can go home soon, but not understand his deficits.   1:50 PM Spoke with Edward Rodgers to give her the team conference update regarding goals of min assist wheelchair level and need for ramp along with closer to discharge date family training. She reports his brother will be assisting her with his care at home. Edward Rodgers had no questions and heard the information provided. Will continue to work on discharge needs.

## 2023-02-25 NOTE — Progress Notes (Signed)
Loop recorder site dry with intact dressing. Will reinforce dressing this morning and allow him to shower. Will take down old outer dressing later today or tomorrow.

## 2023-02-25 NOTE — Progress Notes (Signed)
Speech Language Pathology Daily Session Note  Patient Details  Name: Edward Rodgers MRN: [id number] Date of Birth: [birth year]  Today's Date: 02/25/2023 SLP Individual Time: 7829-5621 SLP Individual Time Calculation (min): 53 min  Short Term Goals: Week 1: SLP Short Term Goal 1 (Week 1): Patient will solve basic environmental and functional problems with 80% accuracy given max assist. SLP Short Term Goal 2 (Week 1): Patient will utilize external and internal memory aids to recall daily information with 75% accuracy given max assist. SLP Short Term Goal 3 (Week 1): Patient will demonstrate awareness of deficits by stating 2 cognitive and 2 physical deficits when provided with max verbal cues.  Skilled Therapeutic Interventions: SLP conducted skilled therapy session targeting cognitive retraining goals. Patient greeted upright in TIS wheelchair and was agreeable to therapy session. SLP and patient discussed events of the morning with patient benefiting from max cues to recall therapy sessions and tasks completed. Patient's alertness and sustained attention improved since previous session, requiring just min assist for sustained attention to task for 30 minutes. Patient benefited from max assist for working memory during task and mod cues to attend to left side column of functional attention task. Patient also benefits from max assist for scanning and executive functioning. Patient was left in TIS wheelchair with call bell in reach and chair alarm set. SLP will continue to target goals per plan of care.       Pain Pain Assessment Pain Scale: 0-10 Pain Score: 0-No pain  Therapy/Group: Individual Therapy  [name], M.A., CCC-SLP  [name] 02/25/2023, 3:41 PM

## 2023-02-25 NOTE — Progress Notes (Signed)
Physical Therapy Session Note  Patient Details  Name: Edward Rodgers MRN: [id number] Date of Birth: 09-01-1950  Today's Date: 02/25/2023 PT Individual Time: 1031-1130 PT Individual Time Calculation (min): 59 min   Short Term Goals: Week 1:  PT Short Term Goal 1 (Week 1): pt will perfrom sit to stand with LRAD and mod A PT Short Term Goal 2 (Week 1): pt will perform bed to chair transfer with LRAD and mod A PT Short Term Goal 3 (Week 1): Pt will perform bed mobility with LRAD and mod A PT Short Term Goal 4 (Week 1): pt will ambulate 50 feet with LRAD and +2 A  Skilled Therapeutic Interventions/Progress Updates:      Pt received from OT in day room. Pt agreeable to therapy. Pt denies any pain.   Pt questioning if he will be able to go home tonight. Education provided on pt diagnosis, POC, LOS. Pt verbalized understanding and agreeable. Pt A&O x4 and aware he had a stroke impacting his L UE/LE but unaware of full extent of deficits.   Treatment Session focused on fitting pt to appropriate TIS WC for improved postural control, and improved anterior pelvic tilt and reduce fall risk.   Pt performed squat pivot transfer WC to mat table x3 in each direction with verbal and tactile cues for anterior weight shift, with therapist providing approximation to L LE to prevent anterior slippage, intially with +2 A progressing to +1 max to heavy mod A.   Pt performed sit to stand with +2 HHA, with mirror in front as visual cue, pt demos improved weight shift to R with verbal cue of "knock justin down with your hip and shoulder. Pt performed 2x10 with verbal and tactile cues for glute activation and upright posture. With +2 HHA.  Pt able to performed L LE marching x5, pt required max A to return pt feet to shoulder width apart as pt demos tendency to adduct L LE.   Pt seated in TIS WC at end of session with all needs within reach and seatbelt alarm on.     Therapy Documentation Precautions:   Precautions Precautions: Fall Precaution/Restrictions Comments: L-hemi, L-inattention, pusher syndrome, Loop recorder placed 2/7 Restrictions Weight Bearing Restrictions Per Provider Order: No  Therapy/Group: Individual Therapy  [facility] [place], [place], DPT  02/25/2023, 4:27 PM

## 2023-02-25 NOTE — Progress Notes (Addendum)
PROGRESS NOTE   Subjective/Complaints:  UA yesterday negative. IVF started yesterday due to AKI. Reports he slept better last night.   LBM yesterday  ROS: as per HPI. Denies CP, shortness of breath abd pain, N/V/D, or any other complaints at this time.   Constipation-improved Denies dysuria/difficulty emptying bladder   Objective:   No results found. Recent Labs    02/23/23 0507 02/24/23 1020  WBC 5.6 5.3  HGB 12.7* 13.0  HCT 39.7 40.2  PLT 189 189   Recent Labs    02/24/23 1020 02/25/23 0514  NA 134* 133*  K 4.2 3.8  CL 102 101  CO2 21* 18*  GLUCOSE 134* 89  BUN 39* 36*  CREATININE 1.73* 1.64*  CALCIUM 10.0 9.1        Intake/Output Summary (Last 24 hours) at 02/25/2023 5621 Last data filed at 02/24/2023 2357 Gross per 24 hour  Intake 877.7 ml  Output --  Net 877.7 ml     Pressure Injury 02/16/23 Buttocks Right Stage 2 -  Partial thickness loss of dermis presenting as a shallow open injury with a red, pink wound bed without slough. pink, red (Active)  02/16/23 1420  Location: Buttocks  Location Orientation: Right  Staging: Stage 2 -  Partial thickness loss of dermis presenting as a shallow open injury with a red, pink wound bed without slough.  Wound Description (Comments): pink, red  Present on Admission: Yes    Physical Exam: Vital Signs Blood pressure 123/77, pulse 76, temperature 97.7 F (36.5 C), temperature source Oral, resp. rate 17, height 5\' 9"  (1.753 m), weight 94 kg, SpO2 95%.  Constitutional: NAD, laying in bed HENT:     Head: Normocephalic and atraumatic.     Nose: Nose normal.     Mouth/Throat:     Mouth: Mucous membranes are moist.  Eyes:     Extraocular Movements: Extraocular movements intact.     Conjunctiva/sclera: Conjunctivae normal.     Pupils: Pupils are equal, round, and reactive to light.  Cardiovascular:     Rate and Rhythm: Normal rate and regular rhythm.      Heart sounds: Murmur heard.  Pulmonary:     Effort: Pulmonary effort is normal. No respiratory distress.     Breath sounds: No stridor. No wheezing or rhonchi.  Abdominal:     General: Bowel sounds are normal. There is no distension.     Palpations: Abdomen is soft.     Tenderness: There is no abdominal tenderness.  Musculoskeletal:        General: Swelling (trace LUE) present.     Cervical back: Normal range of motion.      No pedal edema appreciated Skin:    General: Skin is warm and dry.     Findings: No bruising or erythema.  Psychiatric:     Comments: Pt pleasant and cooperative   Neurological:     Mental Status: He is alert.     Comments: Alert and oriented x 4.  Dysarthric speech.  Fair insight and awareness.  Language overall appears to be intact.  Appears to be subjectively slightly more confused today than yesterday.  Left central 7.  Decreased sensation  to light touch in left upper extremity and left lower extremity.  Moving right upper extremity and right lower extremity to gravity.  Prior Exam Alert and oriented x4 Speech mildly dysarthric. Language  intact. Fair insight and awareness. Left central 7 and tongue deviation. LUE 1/5 prox to 2-3/5 distal. LLLE 1-2/5 prox to 2/5 distal. RUE 4/5, RLE 4/5. Decreased LT and pain in left leg and arm.. DTR's 2+ on left. Toes up. Heel cord a little tight, wearing PRAFO. Mild increased tone in R hand.    Assessment/Plan: 1. Functional deficits which require 3+ hours per day of interdisciplinary therapy in a comprehensive inpatient rehab setting. Physiatrist is providing close team supervision and 24 hour management of active medical problems listed below. Physiatrist and rehab team continue to assess barriers to discharge/monitor patient progress toward functional and medical goals  Care Tool:  Bathing    Body parts bathed by patient: Left arm, Chest, Abdomen, Front perineal area, Right upper leg, Left upper leg, Right lower leg,  Face   Body parts bathed by helper: Right arm, Buttocks, Left lower leg     Bathing assist Assist Level: Moderate Assistance - Patient 50 - 74%     Upper Body Dressing/Undressing Upper body dressing        Upper body assist Assist Level: Moderate Assistance - Patient 50 - 74%    Lower Body Dressing/Undressing Lower body dressing            Lower body assist Assist for lower body dressing: Maximal Assistance - Patient 25 - 49%     Toileting Toileting    Toileting assist Assist for toileting: Maximal Assistance - Patient 25 - 49%     Transfers Chair/bed transfer  Transfers assist     Chair/bed transfer assist level: 2 Helpers     Locomotion Ambulation   Ambulation assist   Ambulation activity did not occur: Safety/medical concerns  Assist level: 2 helpers Assistive device:  (handrail) Max distance: 15 feet   Walk 10 feet activity   Assist  Walk 10 feet activity did not occur: Safety/medical concerns  Assist level: 2 helpers Assistive device:  (handrail)   Walk 50 feet activity   Assist Walk 50 feet with 2 turns activity did not occur: Safety/medical concerns         Walk 150 feet activity   Assist Walk 150 feet activity did not occur: Safety/medical concerns         Walk 10 feet on uneven surface  activity   Assist Walk 10 feet on uneven surfaces activity did not occur: Safety/medical concerns         Wheelchair     Assist Is the patient using a wheelchair?: Yes Type of Wheelchair: Manual    Wheelchair assist level: Minimal Assistance - Patient > 75% Max wheelchair distance: 50    Wheelchair 50 feet with 2 turns activity    Assist        Assist Level: Minimal Assistance - Patient > 75%   Wheelchair 150 feet activity     Assist      Assist Level: Maximal Assistance - Patient 25 - 49%   Blood pressure 123/77, pulse 76, temperature 97.7 F (36.5 C), temperature source Oral, resp. rate 17, height 5'  9" (1.753 m), weight 94 kg, SpO2 95%.  Medical Problem List and Plan: 1. 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  Await plan for loop recorder             -  patient may shower -ELOS/Goals: 21-25 days, min assist with PT, OT and supervision with SLP -Continue CIR -Team conference today please see physician documentation under team conference tab, met with team  to discuss problems,progress, and goals. Formulized individual treatment plan based on medical history, underlying problem and comorbidities.   2.  Antithrombotics: -DVT/anticoagulation:  Pharmaceutical: Heparin 5000U q8h -antiplatelet therapy: Aspirin 81 mg daily and Plavix 75 mg day x 3 weeks then Plavix alone 3. Pain Management: Lidoderm patch as directed, Tylenol as needed 4. Mood/Sleep/sundowning: Effexor 150 mg every evening, provide emotional support             -antipsychotic agents: Seroquel 50 mg nighty at 2030, have back up dose             -keep sleep chart 5. Neuropsych/cognition: This patient is capable of making decisions on his own behalf. 6. Skin/Wound Care: Routine skin checks 7. Fluids/Electrolytes/Nutrition: Routine in and outs with follow-up chemistries, continue vitamins/supplements 8.  Hypothyroidism.  Synthroid 87mcg/day 9.  Hypertension. Norvasc 10 mg daily, Toprol-Xl 100 mg daily, Aldactone 25 mg daily.             -bp trending up. Likely will need additions to regimen  -02/22/23 BPs 140s/90s, not terrible, monitor trend with increase mobility  2/10 BP controlled, decrease spironolactone due to elevated CR/BUN  2/12 BP controlled, continue current  Vitals:   02/22/23 0400 02/22/23 1320 02/22/23 1911 02/23/23 0404  BP: (!) 144/92 135/88 121/87 129/78   02/23/23 0938 02/23/23 0949 02/23/23 1428 02/23/23 2023  BP: 131/83 122/86 119/84 (!) 140/87   02/24/23 0450 02/24/23 1322 02/24/23 1824 02/25/23 0442  BP: 107/83 119/76 133/73  123/77    10.H/O prostate cancer. Myrbetriq 50 mg daily. 11.Hyperlipidemia. Lipitor 80mg  daily 12.Constipation.             -miralax daily, SenokotS 1 tab daily -02/22/23 LBM 2days ago, monitor today, if no BM tomorrow then may want to increase regimen -2/11 LBM yesterday, patient reports constipation is improved 2/12 LBM yesterday 13.H/O gout. Zyloprim 300mg  daily 14.  AKI versus CKD  -Encourage oral fluid intake, decrease spironolactone to 12.5 mg  -2/11 recheck BMP today  Addendum- increased Cr, stop spironolactone, start IVF NS 7ml/HR  -2/12 Cr slightly improved, continue IVF, encourage oral fluids 15.  History of CHF.  Last EF 55%, mild diastolic dysfunction  -Continue metoprolol, decrease spironolactone to 12.5 mg as above  -Wt trending up, continue to monitor   Filed Weights   02/23/23 0505 02/24/23 1032 02/25/23 0500  Weight: 91.5 kg 93.4 kg 94 kg    16.  Severe cervical stenosis  -Consider neurosurgery outpatient follow-up 17. Insomnia  -Patient has Seroquel 50 mg scheduled and 25 mg as needed.  Will also add in 3 mg melatonin at bedtime  -2/11 increase Seroquel to 75 mg nightly  Slept better last night   18.  Bleeding from his penis  -UA neg,HGB was stable on 2/11  19. Nasal congestion  -Flonase  LOS: 4 days A FACE TO FACE EVALUATION WAS PERFORMED  Fanny Dance 02/25/2023, 8:32 AM

## 2023-02-25 NOTE — Patient Care Conference (Signed)
Inpatient RehabilitationTeam Conference and Plan of Care Update Date: 02/25/2023   Time: 12:05 PM    Patient Name: Edward Rodgers      Medical Record Number: [id number]  Date of Birth: [birth year] Sex: Male         Room/Bed: 4M04C/4M04C-01 Payor Info: Payor: [employer] MEDICARE / Plan: [facility] MEDICARE / Product Type: *No Product type* /    Admit Date/Time:  02/21/2023  2:28 PM  Primary Diagnosis:  Acute right PCA stroke [facility])  Hospital Problems: Principal Problem:   Acute right PCA stroke [facility])    Expected Discharge Date: Expected Discharge Date: 03/14/23  Team Members Present: Physician leading conference: Dr. Fanny Dance Social Worker Present: Dossie Der, LCSW Nurse Present: Konrad Dolores, RN PT Present: Ambrose Finland, PT OT Present: Lou Cal, OT SLP Present: [name], SLP PPS Coordinator present : Fae Pippin, SLP     Current Status/Progress Goal Weekly Team Focus  Bowel/Bladder   Patient is contienet of bowel and bladder, last bowel movement 2/11.   Patient will remain continent.   Preventing incontinence.    Swallow/Nutrition/ Hydration               ADL's   Mod A for UB care; Max A for LB care & toileting. Barriers: Decreased attention to hemibody, decreased midline orientation in sitting/standing (strong L lateral lean/pushing), decreased carryover/recall from sessions, decreased attention to tasks.   Min A overall; BSC & TTB   LUE/LLE NMR, midline orientation, progress to dynamic sitting/standing balance, continous caregiver education.    Mobility   bed mobility: max A, sit to stand, stand pivot transfer +2 A progressing to +1 max A however inconsistent 2/2 poor recall/cognitive deficits. gait x15 feet with R UE on rail and +2 skilled assist.   currently min A goals for transfers, do not anticipate pt to be functional ambulator at home upon discharge however working on gait for NMR.  D/C 3/1 with HHPT, barriers to discharge:  decreased caregiver support, poor recall/cognitive deficits, poor midline orientation 2/2 inattetion/negelct of L UE/LE and pusher syndrome. DME TBD; education provided to brother about installing a ramp.    Communication                Safety/Cognition/ Behavioral Observations  overall max to total verbal cues required to basic problem solving, sustained attention, intellectual/anticipatory/emergent awareness   min assist   basic problem solving, sustained attention, awareness of deficits    Pain   Patient endorses headache intermittently, PRN tylenol resolves.   Pain level < 5.   Treat pain that may arise.    Skin   Patient has stage 2 on sacrum.   Treat current pressure injury and prevent others from arising.  Turn Q2 in efforts to prevent further skin breakdown.      Discharge Planning:  Plan to go home with wife who is a CNA and still works but she will take FMLA to provide assist   Team Discussion: Patient post right PCA CVA with history of heart failure.  Limited by poor awareness of deficits and problem solving deficits with emergent/anticipatory and sustained attention issues and poor carry over.  Patient on target to meet rehab goals: no, currently needs mod assist for upper body care and max assist for lower body care and toileting. Needs +2 max assist to complete a sit - stand and stand pivot transfer. Ambulated 59' with +2 skilled assist; staff do not anticipate he will be a functional ambulator at discharge.   *  See Care Plan and progress notes for long and short-term goals.   Revisions to Treatment Plan:  Tilt in space wheelchair and special cushion for chair   Teaching Needs: Safety, medications, transfers, toileting, dietary modification, etc.   Current Barriers to Discharge: Decreased caregiver support and Home enviroment access/layout  Possible Resolutions to Barriers: Family education DME: BSC, TTB and wheelchair     Medical Summary Current  Status: HTN, Bleeding penis, constipation, insomnia, nasal congestion, AKI  Barriers to Discharge: Medical stability;Renal Insufficiency/Failure  Barriers to Discharge Comments: Poor caryover, HTN, Bleeding penis, constipation, insomnia, nasal congestion, AKI Possible Resolutions to [employer] Focus: congnitive deficits, poor caryover, IVF   Continued Need for Acute Rehabilitation Level of Care: The patient requires daily medical management by a physician with specialized training in physical medicine and rehabilitation for the following reasons: Direction of a multidisciplinary physical rehabilitation program to maximize functional independence : Yes Medical management of patient stability for increased activity during participation in an intensive rehabilitation regime.: Yes Analysis of laboratory values and/or radiology reports with any subsequent need for medication adjustment and/or medical intervention. : Yes   I attest that I was present, lead the team conference, and concur with the assessment and plan of the team.   Chana Bode B 02/25/2023, 2:54 PM

## 2023-02-25 NOTE — Progress Notes (Signed)
Occupational Therapy Session Note  Patient Details  Name: Edward Rodgers MRN: [id number] Date of Birth: 1950/03/04  Today's Date: 02/25/2023 OT Individual Time: 4098-1191 OT Individual Time Calculation (min): 43 min    Short Term Goals: Week 1:  OT Short Term Goal 1 (Week 1): Pt will attend to L hemibody with Mod levels of cuing. OT Short Term Goal 2 (Week 1): Pt will perform BSC/toilet transfer with Mod A. OT Short Term Goal 3 (Week 1): Pt with threading LB garments with supervision.  Skilled Therapeutic Interventions/Progress Updates:  Pt greeted supine in bed, pt agreeable to OT intervention.      Transfers/bed mobility/functional mobility: pt completed supine>sit to L side of bed with MODA +2. Pt needed assist to maneuver LLE to EOB and assist to elevate trunk into sitting, cues needed for technique and hand placement. Pt completed squat pivots throughout session with MIN A +2 with MOD verbal cues for technique and head/hips relationship.   Vision: pt continues to report double vision however reports improvements when L eye occluded. Donned opaque tape to nasal portion of L lense,education provided on removing tape as needed to decrease double vision. Pt did report improvements in diplopia with tape donned.   NMR:  Pt completed multiple sit>stands from EOM with pt needing MODA +2 to stand with LLE blocked and LUE supported. Added dynamic reaching to L side with RUE to provide NMR to LLE. Pt needed up to MAX A for standing balance when adding in extra challenge of reaching with pts L lean worsening during dynamic reaching tasks.  Pt was able to sit EOM with light CGA, mirror provided for visual feedback however as pt fatigues pt presents with L and posterior lean. Pt was able to reach dynamically with RUE to mirror to challenge balance with MINA.   Pt completed seated LUE NMR therex with pt demonstrating improved proximal movement with pt able to elevate LUE against gravity with light  MINA. Pt completed towel slides to R<>L with light MINA to facilitate improved horizontal shoulder ABD/ADD. Pt additionally completed towel slides forward/backwards to improve scapular protraction/retraction. Noted L scapular winging, unsure if this was baseline. Pt able to demo ability to shrug bilateral shoulders. Pt was able to grasp wash cloth with L but increased difficulty extending digits once in flexed position.   Handed pt off to PT for next session.              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 Restrictions Weight Bearing Restrictions Per Provider Order: No  Pain: No pain    Therapy/Group: Individual Therapy  Pollyann Glen [facility] 02/25/2023, 12:10 PM

## 2023-02-26 DIAGNOSIS — I63531 Cerebral infarction due to unspecified occlusion or stenosis of right posterior cerebral artery: Secondary | ICD-10-CM | POA: Diagnosis not present

## 2023-02-26 DIAGNOSIS — Z8679 Personal history of other diseases of the circulatory system: Secondary | ICD-10-CM | POA: Diagnosis not present

## 2023-02-26 DIAGNOSIS — G4700 Insomnia, unspecified: Secondary | ICD-10-CM | POA: Diagnosis not present

## 2023-02-26 DIAGNOSIS — N4889 Other specified disorders of penis: Secondary | ICD-10-CM | POA: Diagnosis not present

## 2023-02-26 LAB — BASIC METABOLIC PANEL
Anion gap: 9 (ref 5–15)
BUN: 22 mg/dL (ref 8–23)
CO2: 24 mmol/L (ref 22–32)
Calcium: 10.1 mg/dL (ref 8.9–10.3)
Chloride: 104 mmol/L (ref 98–111)
Creatinine, Ser: 1.2 mg/dL (ref 0.61–1.24)
GFR, Estimated: >60 mL/min (ref >60)
Glucose, Bld: 96 mg/dL (ref 70–99)
Potassium: 3.9 mmol/L (ref 3.5–5.1)
Sodium: 137 mmol/L (ref 135–145)

## 2023-02-26 NOTE — Plan of Care (Signed)
  Problem: Consults Goal: RH STROKE PATIENT EDUCATION Description: See Patient Education module for education specifics  Outcome: Progressing   Problem: RH BOWEL ELIMINATION Goal: RH STG MANAGE BOWEL WITH ASSISTANCE Description: STG Manage Bowel with min Assistance. Outcome: Progressing Goal: RH STG MANAGE BOWEL W/MEDICATION W/ASSISTANCE Description: STG Manage Bowel with Medication with mod I  Assistance. Outcome: Progressing   Problem: RH BLADDER ELIMINATION Goal: RH STG MANAGE BLADDER WITH ASSISTANCE Description: STG Manage Bladder With min Assistance Outcome: Progressing Goal: RH STG MANAGE BLADDER WITH MEDICATION WITH ASSISTANCE Description: STG Manage Bladder With Medication With mod I Assistance. Outcome: Progressing   Problem: RH SKIN INTEGRITY Goal: RH STG SKIN FREE OF INFECTION/BREAKDOWN Description: Manage skin w min assist Outcome: Progressing Goal: RH STG MAINTAIN SKIN INTEGRITY WITH ASSISTANCE Description: STG Maintain Skin Integrity With min Assistance. Outcome: Progressing   Problem: RH SAFETY Goal: RH STG ADHERE TO SAFETY PRECAUTIONS W/ASSISTANCE/DEVICE Description: STG Adhere to Safety Precautions With cues  Assistance/Device. Outcome: Progressing   Problem: RH COGNITION-NURSING Goal: RH STG USES MEMORY AIDS/STRATEGIES W/ASSIST TO PROBLEM SOLVE Description: STG Uses Memory Aids/Strategies With cues Assistance to Problem Solve. Outcome: Progressing   Problem: RH KNOWLEDGE DEFICIT Goal: RH STG INCREASE KNOWLEDGE OF DIABETES Description: Patient and wife will be able to manage prediabetes using educational resources for medications and dietary modification independently Outcome: Progressing Goal: RH STG INCREASE KNOWLEDGE OF HYPERTENSION Description: Patient and wife will be able to manage HTN using educational resources for medications and dietary modification independently Outcome: Progressing Goal: RH STG INCREASE KNOWLEGDE OF  HYPERLIPIDEMIA Description: Patient and wife will be able to manage HLD using educational resources for medications and dietary modification independently Outcome: Progressing Goal: 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

## 2023-02-26 NOTE — Progress Notes (Signed)
PROGRESS NOTE   Subjective/Complaints:  Reports he feels tired this AM, he was sleeping when I first came in.  No additional concerns elicited.  LBM 2/12  ROS: as per HPI. Denies fever, CP, shortness of breath abd pain, N/V/D, or any other complaints at this time.   Constipation-improved Denies dysuria/difficulty emptying bladder   Objective:   No results found. Recent Labs    02/24/23 1020  WBC 5.3  HGB 13.0  HCT 40.2  PLT 189   Recent Labs    02/24/23 1020 02/25/23 0514  NA 134* 133*  K 4.2 3.8  CL 102 101  CO2 21* 18*  GLUCOSE 134* 89  BUN 39* 36*  CREATININE 1.73* 1.64*  CALCIUM 10.0 9.1        Intake/Output Summary (Last 24 hours) at 02/26/2023 0912 Last data filed at 02/26/2023 1610 Gross per 24 hour  Intake 838 ml  Output --  Net 838 ml     Pressure Injury 02/16/23 Buttocks Right Stage 2 -  Partial thickness loss of dermis presenting as a shallow open injury with a red, pink wound bed without slough. pink, red (Active)  02/16/23 1420  Location: Buttocks  Location Orientation: Right  Staging: Stage 2 -  Partial thickness loss of dermis presenting as a shallow open injury with a red, pink wound bed without slough.  Wound Description (Comments): pink, red  Present on Admission: Yes    Physical Exam: Vital Signs Blood pressure (!) 156/95, pulse 73, temperature 97.6 F (36.4 C), resp. rate 18, height 5\' 9"  (1.753 m), weight 91.8 kg, SpO2 95%.  Constitutional: NAD, laying in bed HENT:     Head: Normocephalic and atraumatic.     Nose: Nose normal.     Mouth/Throat:     Mouth: Mucous membranes are moist.  Eyes:     Extraocular Movements: Extraocular movements intact.     Conjunctiva/sclera: Conjunctivae normal.     Pupils: Pupils are equal, round, and reactive to light.  Cardiovascular:     Rate and Rhythm: Normal rate and regular rhythm.     Heart sounds: Murmur heard.  Pulmonary:      Effort: Pulmonary effort is normal. No respiratory distress.     Breath sounds: No stridor. No wheezing or rhonchi.  Abdominal:     General: Bowel sounds are normal. There is no distension.     Palpations: Abdomen is soft.     Tenderness: There is no abdominal tenderness.  Musculoskeletal:        General: Swelling (trace LUE) present.     Cervical back: Normal range of motion.      No pedal edema appreciated Skin:    General: Skin is warm and dry.     Findings: No bruising or erythema.  Psychiatric:     Comments: Pt pleasant and cooperative   Neurological:     Mental Status: He is alert.     Comments: Alert and oriented x 4.  Dysarthric speech.  Fair insight and awareness.  Language overall appears to be intact.  Appears to be subjectively slightly more confused today than yesterday.  Left central 7.  Decreased sensation to light touch in  left upper extremity and left lower extremity.  Moving right upper extremity and right lower extremity to gravity.  Prior Exam Sleeping but wakes to voice, oriented x4 Speech mildly dysarthric. Language  intact. Fair insight and awareness. Left central 7 and tongue deviation. LUE 1/5 prox to 2-3/5 distal. LLLE 1-2/5 prox to 2/5 distal. RUE 4/5, RLE 4/5. Decreased LT and pain in left leg and arm.. DTR's 2+ on left. Toes up. Heel cord a little tight, wearing PRAFO. Mild increased tone in R hand.    Assessment/Plan: 1. Functional deficits which require 3+ hours per day of interdisciplinary therapy in a comprehensive inpatient rehab setting. Physiatrist is providing close team supervision and 24 hour management of active medical problems listed below. Physiatrist and rehab team continue to assess barriers to discharge/monitor patient progress toward functional and medical goals  Care Tool:  Bathing    Body parts bathed by patient: Left arm, Chest, Abdomen, Front perineal area, Right upper leg, Left upper leg, Right lower leg, Face   Body parts bathed  by helper: Right arm, Buttocks, Left lower leg     Bathing assist Assist Level: Moderate Assistance - Patient 50 - 74%     Upper Body Dressing/Undressing Upper body dressing        Upper body assist Assist Level: Moderate Assistance - Patient 50 - 74%    Lower Body Dressing/Undressing Lower body dressing            Lower body assist Assist for lower body dressing: Maximal Assistance - Patient 25 - 49%     Toileting Toileting    Toileting assist Assist for toileting: Maximal Assistance - Patient 25 - 49%     Transfers Chair/bed transfer  Transfers assist     Chair/bed transfer assist level: 2 Helpers     Locomotion Ambulation   Ambulation assist   Ambulation activity did not occur: Safety/medical concerns  Assist level: 2 helpers Assistive device:  (handrail) Max distance: 15 feet   Walk 10 feet activity   Assist  Walk 10 feet activity did not occur: Safety/medical concerns  Assist level: 2 helpers Assistive device:  (handrail)   Walk 50 feet activity   Assist Walk 50 feet with 2 turns activity did not occur: Safety/medical concerns         Walk 150 feet activity   Assist Walk 150 feet activity did not occur: Safety/medical concerns         Walk 10 feet on uneven surface  activity   Assist Walk 10 feet on uneven surfaces activity did not occur: Safety/medical concerns         Wheelchair     Assist Is the patient using a wheelchair?: Yes Type of Wheelchair: Manual    Wheelchair assist level: Minimal Assistance - Patient > 75% Max wheelchair distance: 50    Wheelchair 50 feet with 2 turns activity    Assist        Assist Level: Minimal Assistance - Patient > 75%   Wheelchair 150 feet activity     Assist      Assist Level: Maximal Assistance - Patient 25 - 49%   Blood pressure (!) 156/95, pulse 73, temperature 97.6 F (36.4 C), resp. rate 18, height 5\' 9"  (1.753 m), weight 91.8 kg, SpO2  95%.  Medical Problem List and Plan: 1. 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  Await plan for loop recorder             -  patient may shower -ELOS/Goals: 21-25 days, min assist with PT, OT and supervision with SLP -Continue CIR -Expected discharge 03/14/2023  2.  Antithrombotics: -DVT/anticoagulation:  Pharmaceutical: Heparin 5000U q8h -antiplatelet therapy: Aspirin 81 mg daily and Plavix 75 mg day x 3 weeks then Plavix alone 3. Pain Management: Lidoderm patch as directed, Tylenol as needed 4. Mood/Sleep/sundowning: Effexor 150 mg every evening, provide emotional support             -antipsychotic agents: Seroquel 50 mg nighty at 2030, have back up dose             -keep sleep chart 5. Neuropsych/cognition: This patient is capable of making decisions on his own behalf. 6. Skin/Wound Care: Routine skin checks 7. Fluids/Electrolytes/Nutrition: Routine in and outs with follow-up chemistries, continue vitamins/supplements 8.  Hypothyroidism.  Synthroid 49mcg/day 9.  Hypertension. Norvasc 10 mg daily, Toprol-Xl 100 mg daily, Aldactone 25 mg daily.             -bp trending up. Likely will need additions to regimen  -02/22/23 BPs 140s/90s, not terrible, monitor trend with increase mobility  2/10 BP controlled, decrease spironolactone due to elevated CR/BUN  2/15 BP mildly elevated yesterday, continue to monitor trend for now  Vitals:   02/23/23 0404 02/23/23 0938 02/23/23 0949 02/23/23 1428  BP: 129/78 131/83 122/86 119/84   02/23/23 2023 02/24/23 0450 02/24/23 1322 02/24/23 1824  BP: (!) 140/87 107/83 119/76 133/73   02/25/23 0442 02/25/23 1401 02/25/23 1941 02/26/23 0514  BP: 123/77 (!) 149/76 (!) 150/83 (!) 156/95    10.H/O prostate cancer. Myrbetriq 50 mg daily. 11.Hyperlipidemia. Lipitor 80mg  daily 12.Constipation.             -miralax daily, SenokotS 1 tab daily -02/22/23 LBM 2days ago, monitor today, if  no BM tomorrow then may want to increase regimen -2/11 LBM yesterday, patient reports constipation is improved 2/13 no BM yesterday, improved 13.H/O gout. Zyloprim 300mg  daily 14.  AKI versus CKD  -Encourage oral fluid intake, decrease spironolactone to 12.5 mg  -2/11 recheck BMP today  Addendum- increased Cr, stop spironolactone, start IVF NS 25ml/HR  -2/12 Cr slightly improved, continue IVF, encourage oral fluids  -2/13 recheck NP 15.  History of CHF.  Last EF 55%, mild diastolic dysfunction  -Continue metoprolol, decrease spironolactone to 12.5 mg as above  -2/13 weight a little lower today, unsure how accurate this reading was  [facility] Weights   02/24/23 1032 02/25/23 0500 02/26/23 0514  Weight: 93.4 kg 94 kg 91.8 kg    16.  Severe cervical stenosis  -Consider neurosurgery outpatient follow-up 17. Insomnia  -Patient has Seroquel 50 mg scheduled and 25 mg as needed.  Will also add in 3 mg melatonin at bedtime  -2/11 increase Seroquel to 75 mg nightly  -2/13 asked nursing to complete sleep-wake chart  18.  Bleeding from his penis  -UA neg,HGB was stable on 2/11  -Denies additional occurrences.  During team meeting was noted this happened intermittently during acute hospital stay  19. Nasal congestion  -Flonase  LOS: 5 days A FACE TO FACE EVALUATION WAS PERFORMED  Fanny Dance 02/26/2023, 9:12 AM

## 2023-02-26 NOTE — Progress Notes (Signed)
Physical Therapy Session Note  Patient Details  Name: Edward Rodgers MRN: [id number] Date of Birth: Sep 18, 1950  Today's Date: 02/26/2023 PT Individual Time: 4098-1191 PT Individual Time Calculation (min): 70 min   Short Term Goals: Week 1:  PT Short Term Goal 1 (Week 1): pt will perfrom sit to stand with LRAD and mod A PT Short Term Goal 2 (Week 1): pt will perform bed to chair transfer with LRAD and mod A PT Short Term Goal 3 (Week 1): Pt will perform bed mobility with LRAD and mod A PT Short Term Goal 4 (Week 1): pt will ambulate 50 feet with LRAD and +2 A  Skilled Therapeutic Interventions/Progress Updates:        Pt asleep in room upon arrival. Pt awoken and agreeable to therapy. Pt denies any pain.   Pt incontinent of bladder. Pt performed rolling B with max A, verbal and tactile cues and HOH assist provided to facilitate use of L UE/L LE. Donned brief and performed pericare with total A. Pt feeded pants through legs with total A, however encouraged pt to lean forward to donn pants while in long sittting positoin (with HOB elevated). Pt performed supine bridge with therapist assisting stabilizing L LE in posiiton to faciliate weight bearing, donned pants over buttocks with total A.  L sidelying to sit with therapist facilitating approximation of L UE and activaton of core, pt performed with max A.   Pt seated EOB, donned shoes with total A, and required sup/min A for seated balance 2/2 posterior LOB, verbal and tactile cues for anterior weight shift and use of L UE for maintenance of seated balance.   Pt demos improved recall of steps for squat pivot transfer today once awake and seated EOB. Therapist utilizing combination max queisoning/instuctional cues for squat pivot trasnfer bed to TIS, TIS to mat table. Pt intially performing with +1 max A, but progressed to min-mod A with practice, with therapist emphasizing L LE positioning and anterior weight shift.    Pt demos improved  attention to and activation of L LE with raising leg from leg rest and positioning L LE, however still needing min A and cues for initiation.   Pt performed mass practice of sit to stand practice with +1 max progressing to min-mod A, with mirror as visual cue, and max questioning/verbal cues for midline orientation, anterior weight shift, and approximation of L UE on L LE, and activation of L LE.   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 Restrictions Weight Bearing Restrictions Per Provider Order: No  Therapy/Group: Individual Therapy  [facility] Ambrose [place], [place], DPT  02/26/2023, 9:40 AM

## 2023-02-26 NOTE — Progress Notes (Signed)
Occupational Therapy Session Note  Patient Details  Name: Edward Rodgers MRN: [id number] Date of Birth: 04/19/1950  Today's Date: 02/26/2023 OT Individual Time: 1301-1415 OT Individual Time Calculation (min): 74 min    Short Term Goals: Week 1:  OT Short Term Goal 1 (Week 1): Pt will attend to L hemibody with Mod levels of cuing. OT Short Term Goal 2 (Week 1): Pt will perform BSC/toilet transfer with Mod A. OT Short Term Goal 3 (Week 1): Pt with threading LB garments with supervision.  Skilled Therapeutic Interventions/Progress Updates:   Pt seen for skilled OT session this pm. Pt's lunch late and not delivered upon OT arrival. OT worked with +2 for OOB from bed level. Facilitated rolling from [facility] elevated and EOB sitting with mod fading to min A. Transfer to TIS mod A to R side for modified squat pivot transfer with cues and support to L UE. Pt now with emerging functional grasp to L hand. Wife arrived. OT educated in PNF techniques as well as self feeding integration with L UE. Foam grasp cube exercises instructed. Use of L UE during meal with napkin and finger foods with 40% integration. Friend arrived and OT left pt with family bedside with chir alarm set, needs and nurse call button in reach.   Pain: denied pain  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 Restrictions Weight Bearing Restrictions Per Provider Order: No   Therapy/Group: Individual Therapy  Vicenta Dunning 02/26/2023, 7:58 AM

## 2023-02-26 NOTE — Progress Notes (Signed)
Speech Language Pathology Daily Session Note  Patient Details  Name: Edward Rodgers MRN: [id number] Date of Birth: 05-10-1950  Today's Date: 02/26/2023 SLP Individual Time: 4098-1191 SLP Individual Time Calculation (min): 53 min  Short Term Goals: Week 1: SLP Short Term Goal 1 (Week 1): Patient will solve basic environmental and functional problems with 80% accuracy given max assist. SLP Short Term Goal 2 (Week 1): Patient will utilize external and internal memory aids to recall daily information with 75% accuracy given max assist. SLP Short Term Goal 3 (Week 1): Patient will demonstrate awareness of deficits by stating 2 cognitive and 2 physical deficits when provided with max verbal cues.  Skilled Therapeutic Interventions: SLP conducted skilled therapy session targeting cognitive retraining goals. Patient participated in various Valentine's themed activities requiring executive functioning, scanning, working memory, and problem solving. Patient sustained attention to task for 45 minutes given supervision to min assist and solved basic to mildly complex problems with min assist overall. He demonstrates improving intellectual awareness of physical deficits, though continues to require max assist to identify cognitive deficits. At end of session, requested assist to commode and demonstrated reduced spatial and situational awareness, stating "is the bathroom outside?" And pointing to the window. SLP oriented patient to bathroom location and assisted patient alongside NT via Stedy +2 to the commode. SLP exited and alerted another member of nursing staff to assist NT with transfer back to bed. SLP will continue to target goals per plan of care.       Pain Pain Assessment Pain Scale: 0-10 Pain Score: 0-No pain  Therapy/Group: Individual Therapy  [name], M.A., CCC-SLP  [name] 02/26/2023, 2:30 PM

## 2023-02-27 DIAGNOSIS — F54 Psychological and behavioral factors associated with disorders or diseases classified elsewhere: Secondary | ICD-10-CM

## 2023-02-27 DIAGNOSIS — N179 Acute kidney failure, unspecified: Secondary | ICD-10-CM | POA: Diagnosis not present

## 2023-02-27 DIAGNOSIS — Z8679 Personal history of other diseases of the circulatory system: Secondary | ICD-10-CM | POA: Diagnosis not present

## 2023-02-27 DIAGNOSIS — G4700 Insomnia, unspecified: Secondary | ICD-10-CM | POA: Diagnosis not present

## 2023-02-27 DIAGNOSIS — I63531 Cerebral infarction due to unspecified occlusion or stenosis of right posterior cerebral artery: Secondary | ICD-10-CM | POA: Diagnosis not present

## 2023-02-27 NOTE — Plan of Care (Signed)
  Problem: Consults Goal: RH STROKE PATIENT EDUCATION Description: See Patient Education module for education specifics  Outcome: Progressing   Problem: RH BOWEL ELIMINATION Goal: RH STG MANAGE BOWEL WITH ASSISTANCE Description: STG Manage Bowel with min Assistance. Outcome: Progressing Goal: RH STG MANAGE BOWEL W/MEDICATION W/ASSISTANCE Description: STG Manage Bowel with Medication with mod I  Assistance. Outcome: Progressing   Problem: RH BLADDER ELIMINATION Goal: RH STG MANAGE BLADDER WITH ASSISTANCE Description: STG Manage Bladder With min Assistance Outcome: Progressing Goal: RH STG MANAGE BLADDER WITH MEDICATION WITH ASSISTANCE Description: STG Manage Bladder With Medication With mod I Assistance. Outcome: Progressing   Problem: RH SKIN INTEGRITY Goal: RH STG SKIN FREE OF INFECTION/BREAKDOWN Description: Manage skin w min assist Outcome: Progressing Goal: RH STG MAINTAIN SKIN INTEGRITY WITH ASSISTANCE Description: STG Maintain Skin Integrity With min Assistance. Outcome: Progressing   Problem: RH SAFETY Goal: RH STG ADHERE TO SAFETY PRECAUTIONS W/ASSISTANCE/DEVICE Description: STG Adhere to Safety Precautions With cues  Assistance/Device. Outcome: Progressing   Problem: RH COGNITION-NURSING Goal: RH STG USES MEMORY AIDS/STRATEGIES W/ASSIST TO PROBLEM SOLVE Description: STG Uses Memory Aids/Strategies With cues Assistance to Problem Solve. Outcome: Progressing   Problem: RH KNOWLEDGE DEFICIT Goal: RH STG INCREASE KNOWLEDGE OF DIABETES Description: Patient and wife will be able to manage prediabetes using educational resources for medications and dietary modification independently Outcome: Progressing Goal: RH STG INCREASE KNOWLEDGE OF HYPERTENSION Description: Patient and wife will be able to manage HTN using educational resources for medications and dietary modification independently Outcome: Progressing Goal: RH STG INCREASE KNOWLEGDE OF  HYPERLIPIDEMIA Description: Patient and wife will be able to manage HLD using educational resources for medications and dietary modification independently Outcome: Progressing Goal: 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

## 2023-02-27 NOTE — Progress Notes (Signed)
 Physical Therapy Session Note  Patient Details  Name: Edward Rodgers MRN: [id number] Date of Birth: 03/01/1950  Today's Date: 02/27/2023 PT Individual Time: 1401-1447 PT Individual Time Calculation (min): 46 min   Short Term Goals: Week 1:  PT Short Term Goal 1 (Week 1): pt will perfrom sit to stand with LRAD and mod A PT Short Term Goal 2 (Week 1): pt will perform bed to chair transfer with LRAD and mod A PT Short Term Goal 3 (Week 1): Pt will perform bed mobility with LRAD and mod A PT Short Term Goal 4 (Week 1): pt will ambulate 50 feet with LRAD and +2 A   Skilled Therapeutic Interventions/Progress Updates:  Patient seated upright in TIS w/c on entrance to room. Wife and pt's pastor are present. Patient alert and agreeable to PT session.   Patient with no pain complaint at start of session.  Therapeutic Activity/ NMR: Transfers: Pt guided in squat pivot transfer from w/c to mat table to pt's R side. Requires vc for body positioning and then is able to follow instructions and vc/ tc for performance with MinA. Sitting balance fair/ good.   Initiated sit<>stand transfers with focus on midline orientation and standing balance to no AD. Provided pt with verbal instruction and visual demonstration provided prior to performance with pt demonstrating understanding.  NDT tc provided at ribcage for increased forward lean in hip hinge rather than trunk flexion and is able to rise to stand with good activation noted in LLE. Stands with slight bias to L side and LLE slowly melting into mild flexion. Is able to correct with vc for upright posture.   Cued for R lateral pelvic shift to touch tech's hip and then cued to shift head/ shoulders to R in order to improve hold in midline. Provided with mirror to improve midline orientation and hold in position.   Pt also demos posterior bias and cued/ guided into bringing head/ shoulders forward to bring weight into toes rather than over heels. Pt is able to  attain upright balance and stands with no AD and no physical support for very brief periods. Improves from ModA to CGA during blocked practice. Stands for >29min in first bout.   NMR performed for improvements in motor control and coordination, balance, sequencing, judgement, and self confidence/ efficacy in performing all aspects of mobility at highest level of independence.  Gait Training:  Pt ambulated 32 ft using R hallway HR with ModA initially and improving to Min/ ModA overall. Required vc throughout for sequence, L foot placement, increased lateral pelvic shift, hip/ knee flexion and L DF for improved foot clearance and heel to toe progression. Provided vc/ tc throughout for above corrections.   Pt very happy and emotional on return to room. Also very happy to see friend in room.   Patient seated upright in TIS w/c at end of session with brakes locked, belt alarm set, and all needs within reach.   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 Restrictions Weight Bearing Restrictions Per Provider Order: No  Pain:  No pain related this session.  Therapy/Group: Individual Therapy  Loel Dubonnet PT, DPT, CSRS 02/27/2023, 6:19 PM

## 2023-02-27 NOTE — Progress Notes (Signed)
Speech Language Pathology Daily Session Note  Patient Details  Name: Edward Rodgers MRN: [id number] Date of Birth: 01-23-1950  Today's Date: 02/27/2023 SLP Individual Time: 1007-1101 and 1304-1400 SLP Individual Time Calculation (min): 54 min and 56 min   Short Term Goals: Week 1: SLP Short Term Goal 1 (Week 1): Patient will solve basic environmental and functional problems with 80% accuracy given max assist. SLP Short Term Goal 2 (Week 1): Patient will utilize external and internal memory aids to recall daily information with 75% accuracy given max assist. SLP Short Term Goal 3 (Week 1): Patient will demonstrate awareness of deficits by stating 2 cognitive and 2 physical deficits when provided with max verbal cues.  Skilled Therapeutic Interventions:  Session 1: SLP conducted skilled therapy session targeting cognitive retraining goals. Patient and patient's brother engaged throughout session. SLP facilitated various problem solving and executive functioning tasks of basic to mild complexity including money math, 4-step sequencing, and calendar/schedule interpretation. Patient benefited from mod to max assist during money math for working memory, min assist for attention to the left during calendar/schedule task, and supervision to min A for sequencing. This session, patient stated 2 cognitive and 2 physical deficits with supervision to min assist, demonstrating improvements to intellectual awareness. Patient was left in lowered bed with call bell in reach and bed alarm set. SLP will continue to target goals per plan of care.    Session 2: SLP conducted skilled therapy session targeting cognitive retraining goals. During previous session, SLP challenged patient to recall information re: name of stuffed animal received for Valentine's day. Upon SLP return 2 hours later, patient recalled information given mod verbal cues. SLP then facilitated calendar organization and schedule management task.  Patient benefited from prompt repetition but otherwise completed the task with supervision.  At end of session, patient received visitors from his church. Visitors provided opportunity to target sustained attention, with patient attending to information provided for 15 minutes given supervision. Patient was left in lowered bed with call bell in reach and bed alarm set. SLP will continue to target goals per plan of care.       Pain  Session 1: Pain Assessment Pain Scale: 0-10 Pain Score: 0-No pain  Session 2: None reported   Therapy/Group: Individual Therapy  [name], M.A., CCC-SLP  [name] 02/27/2023, 11:57 AM

## 2023-02-27 NOTE — Progress Notes (Signed)
PROGRESS NOTE   Subjective/Complaints:  Pt eating breakfast this AM in WC, no new concerns today. Will DC IVF today. Reports he slept better last night.   LBM 2/12  ROS: as per HPI. Denies fever, Chills, CP, shortness of breath abd pain, N/V/D/C, or any other complaints at this time.      Objective:   No results found. Recent Labs    02/24/23 1020  WBC 5.3  HGB 13.0  HCT 40.2  PLT 189   Recent Labs    02/25/23 0514 02/26/23 1139  NA 133* 137  K 3.8 3.9  CL 101 104  CO2 18* 24  GLUCOSE 89 96  BUN 36* 22  CREATININE 1.64* 1.20  CALCIUM 9.1 10.1        Intake/Output Summary (Last 24 hours) at 02/27/2023 0841 Last data filed at 02/26/2023 1800 Gross per 24 hour  Intake 354 ml  Output 300 ml  Net 54 ml         Physical Exam: Vital Signs Blood pressure (!) 147/77, pulse 73, temperature 98 F (36.7 C), temperature source Oral, resp. rate 16, height 5\' 9"  (1.753 m), weight 91.8 kg, SpO2 93%.  Constitutional: NAD, sitting in chair eating breakfast HENT:     Head: Normocephalic and atraumatic.     Nose: Nose normal.     Mouth/Throat:     Mouth: Mucous membranes are moist.  Eyes:     Extraocular Movements: Extraocular movements intact.     Conjunctiva/sclera: Conjunctivae normal.     Pupils: Pupils are equal, round, and reactive to light.  Cardiovascular:     Rate and Rhythm: Normal rate and regular rhythm.     Heart sounds: Murmur heard.  Pulmonary:     Effort: Pulmonary effort is normal. No respiratory distress.     Breath sounds: No stridor. No wheezing or rhonchi.  Abdominal:     General: Bowel sounds are normal. There is no distension.     Palpations: Abdomen is soft.     Tenderness: There is no abdominal tenderness.  Musculoskeletal:        General: Swelling (trace LUE) present.     Cervical back: Normal range of motion.      No pedal edema appreciated Skin:    General: Skin is warm and  dry.     Findings: No bruising or erythema.  Psychiatric:     Comments: Pt pleasant and cooperative   Neurological:     Mental Status: He is alert.     Comments: Alert and oriented x 4.  Slightly dysarthric speech.  Fair insight and awareness.  Language overall appears to be intact.  Left central 7.  Decreased sensation to light touch in left upper extremity and left lower extremity.  Moving right upper extremity and right lower extremity to gravity. Mildly increased tone R hand.    Assessment/Plan: 1. Functional deficits which require 3+ hours per day of interdisciplinary therapy in a comprehensive inpatient rehab setting. Physiatrist is providing close team supervision and 24 hour management of active medical problems listed below. Physiatrist and rehab team continue to assess barriers to discharge/monitor patient progress toward functional and medical goals  Care  Tool:  Bathing    Body parts bathed by patient: Left arm, Chest, Abdomen, Front perineal area, Right upper leg, Left upper leg, Right lower leg, Face   Body parts bathed by helper: Right arm, Buttocks, Left lower leg     Bathing assist Assist Level: Moderate Assistance - Patient 50 - 74%     Upper Body Dressing/Undressing Upper body dressing        Upper body assist Assist Level: Moderate Assistance - Patient 50 - 74%    Lower Body Dressing/Undressing Lower body dressing            Lower body assist Assist for lower body dressing: Maximal Assistance - Patient 25 - 49%     Toileting Toileting    Toileting assist Assist for toileting: Maximal Assistance - Patient 25 - 49%     Transfers Chair/bed transfer  Transfers assist     Chair/bed transfer assist level: 2 Helpers     Locomotion Ambulation   Ambulation assist   Ambulation activity did not occur: Safety/medical concerns  Assist level: 2 helpers Assistive device:  (handrail) Max distance: 15 feet   Walk 10 feet activity   Assist   Walk 10 feet activity did not occur: Safety/medical concerns  Assist level: 2 helpers Assistive device:  (handrail)   Walk 50 feet activity   Assist Walk 50 feet with 2 turns activity did not occur: Safety/medical concerns         Walk 150 feet activity   Assist Walk 150 feet activity did not occur: Safety/medical concerns         Walk 10 feet on uneven surface  activity   Assist Walk 10 feet on uneven surfaces activity did not occur: Safety/medical concerns         Wheelchair     Assist Is the patient using a wheelchair?: Yes Type of Wheelchair: Manual    Wheelchair assist level: Minimal Assistance - Patient > 75% Max wheelchair distance: 50    Wheelchair 50 feet with 2 turns activity    Assist        Assist Level: Minimal Assistance - Patient > 75%   Wheelchair 150 feet activity     Assist      Assist Level: Maximal Assistance - Patient 25 - 49%   Blood pressure (!) 147/77, pulse 73, temperature 98 F (36.7 C), temperature source Oral, resp. rate 16, height 5\' 9"  (1.753 m), weight 91.8 kg, SpO2 93%.  Medical Problem List and Plan: 1. 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  Await plan for loop recorder             -patient may shower -ELOS/Goals: 21-25 days, min assist with PT, OT and supervision with SLP -Continue CIR -Expected discharge 03/14/2023- reviewed date with patient  2.  Antithrombotics: -DVT/anticoagulation:  Pharmaceutical: Heparin 5000U q8h -antiplatelet therapy: Aspirin 81 mg daily and Plavix 75 mg day x 3 weeks then Plavix alone 3. Pain Management: Lidoderm patch as directed, Tylenol as needed 4. Mood/Sleep/sundowning: Effexor 150 mg every evening, provide emotional support             -antipsychotic agents: Seroquel 50 mg nighty at 2030, have back up dose             -keep sleep chart 5. Neuropsych/cognition: This patient is capable of  making decisions on his own behalf. 6. Skin/Wound Care: Routine skin checks 7. Fluids/Electrolytes/Nutrition: Routine in and  outs with follow-up chemistries, continue vitamins/supplements 8.  Hypothyroidism.  Synthroid 59mcg/day 9.  Hypertension. Norvasc 10 mg daily, Toprol-Xl 100 mg daily, Aldactone 25 mg daily.             -bp trending up. Likely will need additions to regimen  -02/22/23 BPs 140s/90s, not terrible, monitor trend with increase mobility  2/10 BP controlled, decrease spironolactone due to elevated CR/BUN  2/14 BP BP stable, continue to monitor   Vitals:   02/23/23 1428 02/23/23 2023 02/24/23 0450 02/24/23 1322  BP: 119/84 (!) 140/87 107/83 119/76   02/24/23 1824 02/25/23 0442 02/25/23 1401 02/25/23 1941  BP: 133/73 123/77 (!) 149/76 (!) 150/83   02/26/23 0514 02/26/23 1601 02/26/23 1949 02/27/23 0431  BP: (!) 156/95 103/78 133/83 (!) 147/77    10.H/O prostate cancer. Myrbetriq 50 mg daily. 11.Hyperlipidemia. Lipitor 80mg  daily 12.Constipation.             -miralax daily, SenokotS 1 tab daily -02/22/23 LBM 2days ago, monitor today, if no BM tomorrow then may want to increase regimen -2/11 LBM yesterday, patient reports constipation is improved 2/14 LBM yesterday, continue to monitor  13.H/O gout. Zyloprim 300mg  daily 14.  AKI versus CKD  -Encourage oral fluid intake, decrease spironolactone to 12.5 mg  -2/11 recheck BMP today  Addendum- increased Cr, stop spironolactone, start IVF NS 62ml/HR  -2/12 Cr slightly improved, continue IVF, encourage oral fluids  -2/13 recheck BMP- CR down to 1.2  -2/14 DC IVF, discussed oral hydration 15.  History of CHF.  Last EF 55%, mild diastolic dysfunction  -Continue metoprolol, decrease spironolactone to 12.5 mg as above  -2/14 Wts stable, has not appear to show signs of fluid overload, continue to monitor  Filed Weights   02/25/23 0500 02/26/23 0514 02/27/23 0432  Weight: 94 kg 91.8 kg 91.8 kg    16.  Severe cervical  stenosis  -Consider neurosurgery outpatient follow-up 17. Insomnia  -Patient has Seroquel 50 mg scheduled and 25 mg as needed.  Will also add in 3 mg melatonin at bedtime  -2/11 increase Seroquel to 75 mg nightly  -2/13 asked nursing to complete sleep-wake chart  -2/14 reports sleeping better, continue current regimen for now and monitor  18.  Bleeding from his penis  -UA neg,HGB was stable on 2/11  -Denies additional occurrences.  During team meeting was noted this happened intermittently during acute hospital stay  19. Nasal congestion  -Flonase  LOS: 6 days A FACE TO FACE EVALUATION WAS PERFORMED  Fanny Dance 02/27/2023, 8:41 AM

## 2023-02-27 NOTE — Progress Notes (Signed)
Occupational Therapy Session Note  Patient Details  Name: Edward Rodgers MRN: [id number] Date of Birth: 24-Feb-1950  Today's Date: 02/27/2023 OT Individual Time: 2956-2130 OT Individual Time Calculation (min): 43 min    Short Term Goals: Week 1:  OT Short Term Goal 1 (Week 1): Pt will attend to L hemibody with Mod levels of cuing. OT Short Term Goal 2 (Week 1): Pt will perform BSC/toilet transfer with Mod A. OT Short Term Goal 3 (Week 1): Pt with threading LB garments with supervision.  Skilled Therapeutic Interventions/Progress Updates:   Pt asleep bed level with full breakfast tray in front of him. Pt able to awaken easily and open to all therapy. + 2 TT present for support and both TT and OT donned lower body garments and tennis shoes in supine for time management. Pt was able to bridge with B knees up for over hips with min cues. Pt moved to sidelying with min A and to sit with mod A. EOB with max A to use urinal for 500 cc void. Transfer to TIS to R side with 2 increments with mod fading to min a. Taken sink side for UB bathing and dressing with mod A and cues for hemi techniques. Set up with breakfast tray and left with needs, chair alarm and call button in reach.   Pain: denies pain   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 Restrictions Weight Bearing Restrictions Per Provider Order: No   Therapy/Group: Individual Therapy  Vicenta Dunning 02/27/2023, 7:56 AM

## 2023-02-27 NOTE — Consult Note (Signed)
Neuropsychological Consultation Comprehensive Inpatient Rehab   Patient:   KOLT MCWHIRTER   DOB:   [birth year]  MR Number:  [id number]  Location:  [facility] [address] [place] [address] Dept: [phone] Loc: [phone]           Date of Service:   02/27/2023  Start Time:   9 AM End Time:   A.m.  Provider/Observer:  Arley Phenix, Psy.D.       Clinical Neuropsychologist       Billing Code/Service: [phone]  Reason for Service:    AKIF WELDY is a 73 year old right-handed male referred for neuropsychological consultation during his ongoing admission to the comprehensive inpatient rehabilitation unit.  Patient has been admitted to the unit due to decreased functional mobility and left-sided weakness.  Patient has a past medical history including diastolic congestive heart failure, gout, hypothyroidism, prior cerebellar infarct with residual diplopia, prostate cancer and tobacco use.  Patient presented on 02/02/2023 with left-sided weakness, numbness and disconjugate gaze.  MRI/CT identified right PCA distribution hyperintensity consistent with stroke impacting medial temporal lobe and thalamus.  There is also chronic small vessel ischemic disease noted and left anterior parafalcine meningioma.  There was occlusion of the right P2 segment presumed to be acute.  Patient did receive TNK.  Follow-up MRI revealed progressive acute right PCA territory infarct with extension to right parietal white matter and occipital lobe.  Suspected subcentimeter hemorrhage centrally in the right thalamic region also noted.  During today's clinical visit, the patient described changes in visual field as well as noting that he had been having difficulties with even before this most recent event.  Previous stroke in the cerebellar region has been documented and I suspect cerebellar infarct previously Cardicor is gait and having  balance changes noted by patient.  Patient notes new difficulty with vision with difficulty in synchronizing eye movement.  Patient was very motivated to continue to work on therapeutic interventions and denied significant depression or anxiety type symptoms.  Patient aware of his stroke events.  HPI for the current admission:    HPI: JASKIRAT SCHWIEGER is a 73 year old right-handed male with history of diastolic congestive heart failure, gout, hypothyroidism, prior cerebellar infarct with residual diplopia maintained on full-strength aspirin, prostate cancer, tobacco use. Per chart review patient lives with spouse. Two-level home bed and bath on main level one-step to entry. Modified independent prior to admission. Presented to 02/02/2023 with left-sided weakness numbness and disconjugate gaze. CT/MRI showed diffusion hyperintensity in the right PCA distribution affecting the medial temporal lobe and thalamus. 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 CTA right P2 occlusion presumably acute in the setting. Extensive intracranial atherosclerosis. CT perfusion of the brain showed increased ischemic range perfusion on the right PCA distribution measuring 36 cc as compared to 25 cc on prior imaging. Admission chemistries unremarkable except glucose 115 BUN 24, hemoglobin A1c 6.4. Echocardiogram with ejection fraction of 55% no wall motion abnormalities grade 1 diastolic dysfunction. Patient did receive TNK. MRI follow-up revealed progressive acute right PCA territory infarct with extension most notable in the right periatrial white matter and occipital lobe. There was suspected subcentimeter hemorrhage centrally in the right thalamic infarct. New punctate infarct in the left occipital lobe also seen. Neurology follow-up placed on low-dose aspirin and Plavix for CVA prophylaxis x 3 weeks then Plavix alone. Subcutaneous heparin for DVT prophylaxis. Await plan for loop recorder. Mild AKI  1.20-1.44  placed on gentle IV fluids since discontinued with latest creatinine 1.23. Therapy evaluations completed due to patient decreased functional mobility and left-sided weakness was admitted for a comprehensive rehab program.   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 his dress was Appropriate and he was Well Groomed and his manners were Appropriate to the situation.  his participation was indicative of Appropriate and Redirectable behaviors.  There were physical disabilities noted.  he displayed an appropriate level of cooperation and motivation.    Interactions:    Active Appropriate  Attention:   abnormal and attention span appeared shorter than expected for age  Memory:   abnormal; remote memory intact, recent memory impaired  Visuo-spatial:   not examined  Speech (Volume):  low  Speech:   normal; normal  Thought Process:  Coherent and Circumstantial  Coherent  Though Content:  WNL; not suicidal and not homicidal  Orientation:   person, place, and time/date  Judgment:   Fair  Planning:   Fair  Affect:    Appropriate  Mood:    Euthymic  Insight:   Good  Intelligence:   normal  Psychiatric History:  No prior  psychiatric history  Family Med/Psych History: History reviewed. No pertinent family history.  Impression/DX:   ZOLTAN GENEST is a 73 year old right-handed male referred for neuropsychological consultation during his ongoing admission to the comprehensive inpatient rehabilitation unit.  Patient has been admitted to the unit due to decreased functional mobility and left-sided weakness.  Patient has a past medical history including diastolic congestive heart failure, gout, hypothyroidism, prior cerebellar infarct with residual diplopia, prostate cancer and tobacco use.  Patient presented on 02/02/2023 with left-sided weakness, numbness and disconjugate gaze.  MRI/CT identified right PCA distribution hyperintensity consistent with stroke impacting medial temporal lobe and thalamus.  There is also chronic small vessel ischemic disease noted and left anterior parafalcine meningioma.  There was occlusion of the right P2 segment presumed to be acute.  Patient did receive TNK.  Follow-up MRI revealed progressive acute right PCA territory infarct with extension to right parietal white matter and occipital lobe.  Suspected subcentimeter hemorrhage centrally in the right thalamic region also noted.  During today's clinical visit, the patient described changes in visual field as well as noting that he had been having difficulties with even before this most recent event.  Previous stroke in the cerebellar region has been documented and I suspect cerebellar infarct previously Cardicor is gait and having balance changes noted by patient.  Patient notes new difficulty with vision with difficulty in synchronizing eye movement.  Patient was very motivated to continue to work on therapeutic interventions and denied significant depression or anxiety type symptoms.  Patient aware of his stroke events.  Disposition/Plan:  Today we worked on coping and adjustment issues around that a recent CVA and impacted the paddle and  functioning but also strategies moving forward to work on continued recovery and improvements poststroke.          Electronically Signed   _______________________ Arley Phenix, Psy.D. Clinical Neuropsychologist

## 2023-02-28 DIAGNOSIS — K5901 Slow transit constipation: Secondary | ICD-10-CM | POA: Diagnosis not present

## 2023-02-28 DIAGNOSIS — I10 Essential (primary) hypertension: Secondary | ICD-10-CM | POA: Diagnosis not present

## 2023-02-28 DIAGNOSIS — I63531 Cerebral infarction due to unspecified occlusion or stenosis of right posterior cerebral artery: Secondary | ICD-10-CM | POA: Diagnosis not present

## 2023-02-28 NOTE — Progress Notes (Signed)
 PROGRESS NOTE   Subjective/Complaints:  Pt doing well, slept well, denies pain, LBM last night, urinating ok. Denies any other complaints or concerns.   ROS: as per HPI. Denies fever, Chills, CP, shortness of breath abd pain, N/V/D/C, or any other complaints at this time.      Objective:   No results found. No results for input(s): "WBC", "HGB", "HCT", "PLT" in the last 72 hours.  Recent Labs    02/26/23 1139  NA 137  K 3.9  CL 104  CO2 24  GLUCOSE 96  BUN 22  CREATININE 1.20  CALCIUM 10.1        Intake/Output Summary (Last 24 hours) at 02/28/2023 1033 Last data filed at 02/28/2023 0914 Gross per 24 hour  Intake 118 ml  Output 600 ml  Net -482 ml         Physical Exam: Vital Signs Blood pressure (!) 140/78, pulse 79, temperature 97.7 F (36.5 C), temperature source Oral, resp. rate 18, height 5\' 9"  (1.753 m), weight 95.4 kg, SpO2 96%.  Constitutional: NAD, sitting up in bed HENT:     Head: Normocephalic and atraumatic.     Nose: Nose normal.     Mouth/Throat:     Mouth: Mucous membranes are moist.  Eyes:     Extraocular Movements: Extraocular movements intact.     Conjunctiva/sclera: Conjunctivae normal.     Pupils: Pupils are equal, round, and reactive to light.  Cardiovascular:     Rate and Rhythm: Normal rate and regular rhythm.     Heart sounds: Murmur heard.  Pulmonary:     Effort: Pulmonary effort is normal. No respiratory distress.     Breath sounds: No stridor. No wheezing or rhonchi.  Abdominal:     General: Bowel sounds are normal. There is no distension.     Palpations: Abdomen is soft.     Tenderness: There is no abdominal tenderness.  Musculoskeletal:        General: Swelling (trace LUE) present. Improving    Cervical back: Normal range of motion.      No pedal edema appreciated Skin:    General: Skin is warm and dry.     Findings: No bruising or erythema.  Psychiatric:      Comments: Pt pleasant and cooperative   PRIOR EXAMS: Neurological:     Mental Status: He is alert.     Comments: Alert and oriented x 4.  Slightly dysarthric speech.  Fair insight and awareness.  Language overall appears to be intact.  Left central 7.  Decreased sensation to light touch in left upper extremity and left lower extremity.  Moving right upper extremity and right lower extremity to gravity. Mildly increased tone R hand.    Assessment/Plan: 1. Functional deficits which require 3+ hours per day of interdisciplinary therapy in a comprehensive inpatient rehab setting. Physiatrist is providing close team supervision and 24 hour management of active medical problems listed below. Physiatrist and rehab team continue to assess barriers to discharge/monitor patient progress toward functional and medical goals  Care Tool:  Bathing    Body parts bathed by patient: Left arm, Chest, Abdomen, Front perineal area, Right upper leg, Left  upper leg, Right lower leg, Face   Body parts bathed by helper: Right arm, Buttocks, Left lower leg     Bathing assist Assist Level: Moderate Assistance - Patient 50 - 74%     Upper Body Dressing/Undressing Upper body dressing        Upper body assist Assist Level: Moderate Assistance - Patient 50 - 74%    Lower Body Dressing/Undressing Lower body dressing            Lower body assist Assist for lower body dressing: Maximal Assistance - Patient 25 - 49%     Toileting Toileting    Toileting assist Assist for toileting: Maximal Assistance - Patient 25 - 49%     Transfers Chair/bed transfer  Transfers assist     Chair/bed transfer assist level: 2 Helpers     Locomotion Ambulation   Ambulation assist   Ambulation activity did not occur: Safety/medical concerns  Assist level: 2 helpers Assistive device:  (handrail) Max distance: 15 feet   Walk 10 feet activity   Assist  Walk 10 feet activity did not occur: Safety/medical  concerns  Assist level: 2 helpers Assistive device:  (handrail)   Walk 50 feet activity   Assist Walk 50 feet with 2 turns activity did not occur: Safety/medical concerns         Walk 150 feet activity   Assist Walk 150 feet activity did not occur: Safety/medical concerns         Walk 10 feet on uneven surface  activity   Assist Walk 10 feet on uneven surfaces activity did not occur: Safety/medical concerns         Wheelchair     Assist Is the patient using a wheelchair?: Yes Type of Wheelchair: Manual    Wheelchair assist level: Minimal Assistance - Patient > 75% Max wheelchair distance: 50    Wheelchair 50 feet with 2 turns activity    Assist        Assist Level: Minimal Assistance - Patient > 75%   Wheelchair 150 feet activity     Assist      Assist Level: Maximal Assistance - Patient 25 - 49%   Blood pressure (!) 140/78, pulse 79, temperature 97.7 F (36.5 C), temperature source Oral, resp. rate 18, height 5\' 9"  (1.753 m), weight 95.4 kg, SpO2 96%.  Medical Problem List and Plan: 1. 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  Await plan for loop recorder             -patient may shower -ELOS/Goals: 21-25 days, min assist with PT, OT and supervision with SLP -Continue CIR -Expected discharge 03/14/2023- reviewed date with patient  2.  Antithrombotics: -DVT/anticoagulation:  Pharmaceutical: Heparin 5000U q8h -antiplatelet therapy: Aspirin 81 mg daily and Plavix 75 mg day x 3 weeks then Plavix alone 3. Pain Management: Lidoderm patch as directed, Tylenol as needed 4. Mood/Sleep/sundowning: Effexor 150 mg every evening, provide emotional support             -antipsychotic agents: Seroquel 50 mg nighty at 2030, have back up dose             -keep sleep chart 5. Neuropsych/cognition: This patient is capable of making decisions on his own behalf. 6.  Skin/Wound Care: Routine skin checks 7. Fluids/Electrolytes/Nutrition: Routine in and outs with follow-up chemistries, continue vitamins/supplements 8.  Hypothyroidism.  Synthroid 6mcg/day 9.  Hypertension. Norvasc 10 mg daily, Toprol-Xl 100 mg  daily, Aldactone 25 mg daily.             -bp trending up. Likely will need additions to regimen  -02/22/23 BPs 140s/90s, not terrible, monitor trend with increase mobility  2/10 BP controlled, decrease spironolactone due to elevated CR/BUN -02/28/23 BP stable but creeping up, continue to monitor for now but may need to increase meds back  Vitals:   02/24/23 1322 02/24/23 1824 02/25/23 0442 02/25/23 1401  BP: 119/76 133/73 123/77 (!) 149/76   02/25/23 1941 02/26/23 0514 02/26/23 1601 02/26/23 1949  BP: (!) 150/83 (!) 156/95 103/78 133/83   02/27/23 0431 02/27/23 1626 02/27/23 2010 02/28/23 0516  BP: (!) 147/77 (!) 134/102 (!) 141/88 (!) 140/78    10.H/O prostate cancer. Myrbetriq 50 mg daily. 11.Hyperlipidemia. Lipitor 80mg  daily 12.Constipation.             -miralax daily, SenokotS 1 tab daily -02/22/23 LBM 2days ago, monitor today, if no BM tomorrow then may want to increase regimen -02/28/23 LBM last night 13.H/O gout. Zyloprim 300mg  daily 14.  AKI versus CKD  -Encourage oral fluid intake, decrease spironolactone to 12.5 mg  -2/11 recheck BMP today  Addendum- increased Cr, stop spironolactone, start IVF NS 45ml/HR  -2/12 Cr slightly improved, continue IVF, encourage oral fluids  -2/13 recheck BMP- CR down to 1.2  -2/14 DC IVF, discussed oral hydration 15.  History of CHF.  Last EF 55%, mild diastolic dysfunction  -Continue metoprolol, decrease spironolactone to 12.5 mg as above -2/14 Wts stable, has not appear to show signs of fluid overload, continue to monitor -02/28/23 wt up but doubt accuracy, monitor, no overload on exam  Filed Weights   02/26/23 0514 02/27/23 0432 02/28/23 0801  Weight: 91.8 kg 91.8 kg 95.4 kg    16.  Severe  cervical stenosis: Consider neurosurgery outpatient follow-up 17. Insomnia -Patient has Seroquel 50 mg scheduled and 25 mg as needed.  Will also add in 3 mg melatonin at bedtime  -2/11 increase Seroquel to 75 mg nightly  -2/13 asked nursing to complete sleep-wake chart  -2/14 reports sleeping better, continue current regimen for now and monitor  18.  Bleeding from his penis  -UA neg,HGB was stable on 2/11 -Denies additional occurrences.  During team meeting was noted this happened intermittently during acute hospital stay  19. Nasal congestion: Flonase 1 spray daily  LOS: 7 days A FACE TO FACE EVALUATION WAS PERFORMED  [address] 02/28/2023, 10:33 AM

## 2023-03-01 ENCOUNTER — Inpatient Hospital Stay (HOSPITAL_COMMUNITY): Payer: Medicare Other

## 2023-03-01 DIAGNOSIS — H539 Unspecified visual disturbance: Secondary | ICD-10-CM

## 2023-03-01 DIAGNOSIS — I63531 Cerebral infarction due to unspecified occlusion or stenosis of right posterior cerebral artery: Secondary | ICD-10-CM | POA: Diagnosis not present

## 2023-03-01 DIAGNOSIS — R404 Transient alteration of awareness: Secondary | ICD-10-CM | POA: Diagnosis not present

## 2023-03-01 DIAGNOSIS — I10 Essential (primary) hypertension: Secondary | ICD-10-CM | POA: Diagnosis not present

## 2023-03-01 NOTE — Consult Note (Signed)
 NEUROLOGY CONSULT NOTE   Date of service: March 01, 2023 Patient Name: Edward Rodgers MRN:  [id number] DOB:  12/02/1950 Chief Complaint: "Staring spells" Requesting Provider: Fanny Dance, MD  History of Present Illness  Edward Rodgers is a 73 y.o. male with hx of hypertension, hypothyroidism, gout, cerebellar stroke and recent right PCA stroke in rehabilitation who presents with staring spells which he experienced during Occupational Therapy this morning.  Patient states that he has had these intermittent staring spells for at least a few months.  He reports that sometimes he catches himself just staring at objects but at other times is unaware that they happen.  He does not have any warning before the spells happen.  Patient's family reports that there are no abnormal movements or other signs that appear during the spells.  Patient reports that he has never had a seizure, has no family members with seizures, has never had a serious head injury, did not have febrile seizures as a child, had no problems with his early birth and development has never had meningitis or encephalitis.  He reports some heaviness in his breathing recently but no other recent symptoms.  Patient also states that he has some visual difficulties in his right field, that he is sometimes unable to see print in that area.  He states that this also has been happening for a while.   ROS  Comprehensive ROS performed and pertinent positives documented in HPI   Past History   Past Medical History:  Diagnosis Date   Cancer (HCC) 2002   prostate   CHF (congestive heart failure) (HCC)    Gout    Hypertension    Thyroid disease     Past Surgical History:  Procedure Laterality Date   COLONOSCOPY N/A 07/15/2017   Procedure: COLONOSCOPY;  Surgeon: Malissa Hippo, MD;  Location: [facility];  Service: Endoscopy;  Laterality: N/A;  830   IR CT HEAD LTD  02/14/2023   IR PERCUTANEOUS ART THROMBECTOMY/INFUSION INTRACRANIAL  INC DIAG ANGIO  02/14/2023   LOOP RECORDER INSERTION N/A 02/20/2023   Procedure: LOOP RECORDER INSERTION;  Surgeon: Duke Salvia, MD;  Location: [facility] INVASIVE CV LAB;  Service: Cardiovascular;  Laterality: N/A;   POLYPECTOMY  07/15/2017   Procedure: POLYPECTOMY;  Surgeon: Malissa Hippo, MD;  Location: [facility];  Service: Endoscopy;;  colon   PROSTATE SURGERY     RADIOLOGY WITH ANESTHESIA Left 02/14/2023   Procedure: IR WITH ANESTHESIA;  Surgeon: Radiologist, Medication, MD;  Location: [facility] OR;  Service: Radiology;  Laterality: Left;   RADIOLOGY WITH ANESTHESIA N/A 02/14/2023   Procedure: IR WITH ANESTHESIA;  Surgeon: Radiologist, Medication, MD;  Location: [facility] OR;  Service: Radiology;  Laterality: N/A;    Family History: History reviewed. No pertinent family history.  Social History  reports that he has quit smoking. He has never used smokeless tobacco. He reports that he does not drink alcohol and does not use drugs.  No Known Allergies  Medications   Current Facility-Administered Medications:    acetaminophen (TYLENOL) tablet 650 mg, 650 mg, Oral, Q4H PRN, 650 mg at 02/26/23 2009 **OR** acetaminophen (TYLENOL) 160 MG/5ML solution 650 mg, 650 mg, Per Tube, Q4H PRN **OR** acetaminophen (TYLENOL) suppository 650 mg, 650 mg, Rectal, Q4H PRN, Angiulli, Mcarthur Rossetti, PA-C   allopurinol (ZYLOPRIM) tablet 300 mg, 300 mg, Oral, q morning, Angiulli, Mcarthur Rossetti, PA-C, 300 mg at 03/01/23 0848   amLODipine (NORVASC) tablet 10 mg, 10 mg, Oral, Daily, Angiulli, Reuel Boom  J, PA-C, 10 mg at 03/01/23 0848   aspirin EC tablet 81 mg, 81 mg, Oral, Daily, Angiulli, Mcarthur Rossetti, PA-C, 81 mg at 03/01/23 0848   atorvastatin (LIPITOR) tablet 80 mg, 80 mg, Oral, Daily, Angiulli, Mcarthur Rossetti, PA-C, 80 mg at 03/01/23 0848   calcium-vitamin D (OSCAL WITH D) 500-5 MG-MCG per tablet 1 tablet, 1 tablet, Oral, Daily, Angiulli, Mcarthur Rossetti, PA-C, 1 tablet at 03/01/23 0848   clopidogrel (PLAVIX) tablet 75 mg, 75 mg, Oral, Daily, Angiulli,  Mcarthur Rossetti, PA-C, 75 mg at 03/01/23 0848   feeding supplement (ENSURE ENLIVE / ENSURE PLUS) liquid 237 mL, 237 mL, Oral, BID BM, Angiulli, Daniel J, PA-C, 237 mL at 02/28/23 1601   fluticasone (FLONASE) 50 MCG/ACT nasal spray 1 spray, 1 spray, Each Nare, Daily, Fanny Dance, MD, 1 spray at 03/01/23 0849   heparin injection 5,000 Units, 5,000 Units, Subcutaneous, Q8H, Angiulli, Mcarthur Rossetti, PA-C, 5,000 Units at 03/01/23 0552   latanoprost (XALATAN) 0.005 % ophthalmic solution 1 drop, 1 drop, Right Eye, QHS, Angiulli, Mcarthur Rossetti, PA-C, 1 drop at 02/28/23 2038   levothyroxine (SYNTHROID) tablet 88 mcg, 88 mcg, Oral, QAC breakfast, Angiulli, Mcarthur Rossetti, PA-C, 88 mcg at 03/01/23 0552   lidocaine (LIDODERM) 5 % 1 patch, 1 patch, Transdermal, Q24H, Angiulli, Mcarthur Rossetti, PA-C, 1 patch at 03/01/23 0848   melatonin tablet 3 mg, 3 mg, Oral, QHS, Fanny Dance, MD, 3 mg at 02/28/23 2037   metoprolol succinate (TOPROL-XL) 24 hr tablet 100 mg, 100 mg, Oral, Daily, Angiulli, Mcarthur Rossetti, PA-C, 100 mg at 03/01/23 0848   mirabegron ER (MYRBETRIQ) tablet 50 mg, 50 mg, Oral, Daily, Angiulli, Mcarthur Rossetti, PA-C, 50 mg at 03/01/23 0848   ondansetron (ZOFRAN-ODT) disintegrating tablet 4 mg, 4 mg, Oral, Q8H PRN, Angiulli, Mcarthur Rossetti, PA-C, 4 mg at 02/28/23 1601   polyethylene glycol (MIRALAX / GLYCOLAX) packet 17 g, 17 g, Oral, Daily, Angiulli, Daniel J, PA-C, 17 g at 02/28/23 0944   QUEtiapine (SEROQUEL) tablet 25 mg, 25 mg, Oral, QHS PRN, Ranelle Oyster, MD, 25 mg at 02/27/23 2102   QUEtiapine (SEROQUEL) tablet 75 mg, 75 mg, Oral, QHS, Fanny Dance, MD, 75 mg at 02/28/23 2037   senna-docusate (Senokot-S) tablet 1 tablet, 1 tablet, Oral, QHS, Ranelle Oyster, MD, 1 tablet at 02/28/23 2037   sodium chloride (OCEAN) 0.65 % nasal spray 1 spray, 1 spray, Each Nare, PRN, Angiulli, Mcarthur Rossetti, PA-C   venlafaxine XR (EFFEXOR-XR) 24 hr capsule 150 mg, 150 mg, Oral, QPM, Street, [place], [place], 150 mg at 02/28/23 1601  Vitals    Vitals:   02/28/23 1418 02/28/23 1939 03/01/23 0437 03/01/23 1248  BP: (!) 148/80 (!) 158/97 134/84 133/82  Pulse: 88 98 80 73  Resp: 19 18 16 17   Temp: 98.2 F (36.8 C) 98.6 F (37 C) 98.3 F (36.8 C) 98.8 F (37.1 C)  TempSrc: Oral Oral Oral Oral  SpO2: 96% 92% 93% 93%  Weight:   88.9 kg   Height:        Body mass index is 28.94 kg/m.  Physical Exam   Constitutional: Appears well-developed and well-nourished.  Psych: Affect appropriate to situation.  Eyes: No scleral injection.  HENT: No OP obstruction.  Head: Normocephalic.  Cardiovascular: Normal rate and regular rhythm.  Respiratory: Effort normal, non-labored breathing.  Skin: WDI.   Neurologic Examination    NEURO:  Mental Status: Alert and oriented to person place time and situation, able to give clear and coherent history of present illness  Speech/Language: speech is without dysarthria or aphasia.    Cranial Nerves:  II: PERRL. Visual fields full.  III, IV, VI: EOMI. Eyelids elevate symmetrically.  V: Sensation is intact to light touch and diminished on the left VII: Smile is symmetrical.  VIII: hearing intact to voice. IX, X: Phonation is normal.  HQ:IONGEXBM shrug 5/5. XII: tongue is midline without fasciculations. Motor: 5/5 strength to right upper and lower extremities, 3/5 strength to left upper extremity, 4/5 strength to left lower extremity Tone: is normal and bulk is normal Sensation- Intact to light touch bilaterally.  Coordination: FTN intact on the right, unable to perform on the left Gait- deferred    Labs/Imaging/Neurodiagnostic studies   CBC:  Recent Labs  Lab 02/27/2023 0507 02/24/23 1020  WBC 5.6 5.3  NEUTROABS 2.9 3.1  HGB 12.7* 13.0  HCT 39.7 40.2  MCV 86.9 86.8  PLT 189 189   Basic Metabolic Panel:  Lab Results  Component Value Date   NA 137 02/26/2023   K 3.9 02/26/2023   CO2 24 02/26/2023   GLUCOSE 96 02/26/2023   BUN 22 02/26/2023   CREATININE 1.20 02/26/2023    CALCIUM 10.1 02/26/2023   GFRNONAA >60 02/26/2023   GFRAA >90 03/29/2013   Lipid Panel:  Lab Results  Component Value Date   LDLCALC 30 02/15/2023   HgbA1c:  Lab Results  Component Value Date   HGBA1C 6.4 (H) 02/15/2023   Urine Drug Screen:     Component Value Date/Time   LABOPIA NONE DETECTED 02/14/2023 0615   COCAINSCRNUR NONE DETECTED 02/14/2023 0615   LABBENZ NONE DETECTED 02/14/2023 0615   AMPHETMU NONE DETECTED 02/14/2023 0615   THCU NONE DETECTED 02/14/2023 0615   LABBARB NONE DETECTED 02/14/2023 0615    Alcohol Level     Component Value Date/Time   ETH <10 02/14/2023 0406   INR  Lab Results  Component Value Date   INR 1.0 02/14/2023   APTT  Lab Results  Component Value Date   APTT 23 (L) 02/14/2023   AED levels: No results found for: "PHENYTOIN", "ZONISAMIDE", "LAMOTRIGINE", "LEVETIRACETA"  CT Head without contrast(Personally reviewed): No acute abnormality, left parafalcine meningioma  MRI Brain(Personally reviewed): Right PCA territory stroke, right thalamic infarct, new punctate infarct in left occipital lobe, left anterior parafalcine meningioma, chronic small vessel ischemic disease  Neurodiagnostics Continuous EEG:  Pending  ASSESSMENT   Edward Rodgers is a 73 y.o. male with history of hypertension, hypothyroidism, gout, cerebellar stroke and recent right PCA stroke.  The patient is currently in rehabilitation after his PCA stroke.  He reports some staring spells which he states have been happening for at least the past few months.  Patient states that sometimes he catches himself just staring at objects in the environment and at other times he is unaware that these spells happen.  Patient's family states they have not observed any abnormal movements when the spells happen.  Patient has been placed on long-term EEG, and patient's family was instructed to push button if the staring spell were to occur.  Patient has no seizure risk factors other  than his strokes.  Will monitor EEG to determine the etiology of the spells.  RECOMMENDATIONS  -LTM EEG - AEDs to be discussed if seizure activity appears on LTM ______________________________________________________________________    Signed, Cortney Harland Dingwall, NP [facility]  NEUROHOSPITALIST ADDENDUM Performed a face to face diagnostic evaluation.   I have reviewed the contents of history and physical exam as documented by  PA/ARNP/Resident and agree with above documentation.  I have discussed and formulated the above plan as documented. Edits to the note have been made as needed.  Impression/Key exam findings/Plan: LTM EEG overnight but these episodes have been going on even prior to the current R PCA stroke. He tells me that he sometimes just catches himself starring off. He thinks that he is just thinking during these.  Will leave him on LTM overnight in an attempt to capture his spells.  Erick Blinks, MD [facility] [phone]   If 7pm to 7am, please call on call as listed on AMION.

## 2023-03-01 NOTE — Progress Notes (Signed)
 LTM EEG hooked up and running - no initial skin breakdown - push button tested - [facility] monitoring.

## 2023-03-01 NOTE — Progress Notes (Signed)
 Occupational Therapy Weekly Progress Note  Patient Details  Name: Edward Rodgers MRN: [id number] Date of Birth: 17-Jan-1950  Beginning of progress report period: February 22, 2023 End of progress report period: February 28, 2023  Today's Date: 03/01/2023 OT Individual Time: 2130-8657 OT Individual Time Calculation (min): 52 min    Patient has met 2 of 3 short term goals this reporting period. See below for updated levels of ADL functioning.   Patient continues to demonstrate the following deficits: muscle weakness and muscle joint tightness, decreased cardiorespiratoy endurance, unbalanced muscle activation, decreased coordination, and decreased motor planning, decreased visual acuity and decreased visual perceptual skills, decreased midline orientation and decreased attention to left, decreased initiation, decreased attention, decreased awareness, decreased problem solving, decreased safety awareness, decreased memory, and delayed processing, and decreased sitting balance, decreased standing balance, decreased postural control, and decreased balance strategies and therefore will continue to benefit from skilled OT intervention to enhance overall performance with BADL and Reduce care partner burden.  Patient progressing toward long term goals..  Continue plan of care.  OT Short Term Goals Week 1:  OT Short Term Goal 1 (Week 1): Pt will attend to L hemibody with Mod levels of cuing. OT Short Term Goal 1 - Progress (Week 1): Met OT Short Term Goal 2 (Week 1): Pt will perform BSC/toilet transfer with Mod A. OT Short Term Goal 2 - Progress (Week 1): Met OT Short Term Goal 3 (Week 1): Pt with threading LB garments with supervision. OT Short Term Goal 3 - Progress (Week 1): Progressing toward goal Week 2:  OT Short Term Goal 1 (Week 2): Pt with threading LB garments with supervision, including hemi-body. OT Short Term Goal 2 (Week 2): Pt will integrate LUE in functional tasks with Min cuing. OT  Short Term Goal 3 (Week 2): Pt will maintain midline orientation in static standing with Min A for >2 mins.  Skilled Therapeutic Interventions/Progress Updates:  Pt greeted resting in bed for skilled OT session with focus on BADL retraining.   Pain: Pt with no reports of pain, OT offering intermediate rest breaks and positioning suggestions throughout session to address pain/fatigue and maximize participation/safety in session.   Functional Transfers: Sit<>stand from elevated bed with Mod, fading to Min A + no AD. Stand-pivot from EOB>WC with Mod A.   Self Care Tasks: Pt completes the following self care tasks with levels of assistance noted below, UB: Bathing with Min A for thoroughness of RUE using LUE with intermediate HOH to maintain attention to limb. Dressing with Min A for management of material overhead/down back. LB: Pt bathes B-upper thighs, using LUE for initiation of task. Pt able to thread LLE with Min A, integrating LUE as gross assist/grasping of pant material. Standing hike with Min A, Mod-Max for standing balance. Dependent for donning socks.   Of note, patient with x2 instances of "zoning out" during the above tasks, each instance lasts for ~1 min each, LPN made aware. VSS. Towards the end of session, pt reports increased difficulty with visual acuity. NP then present and updated.   Pt remained sitting in recliner with 4Ps assessed and immediate needs met. Pt continues to be appropriate for skilled OT intervention to promote further functional independence in ADLs/IADLs.   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 Restrictions Weight Bearing Restrictions Per Provider Order: No   Therapy/Group: Individual Therapy  Lou Cal, OTR/L, MSOT  03/01/2023, 6:18 AM

## 2023-03-01 NOTE — Progress Notes (Signed)
 PROGRESS NOTE   Subjective/Complaints:  Pt doing well, but OT working with him this morning stated he "zoned out" for brief periods of time during the session. Hadn't noted that before. He also reports some ?hemianopsia, states the beginning of a sentence is hard to see and he has to move his head one direction then another in order to see it-- but he's had this before, not really acute, just maybe more pronounced today. He describes it like a shadow on the area of vision.  He slept well, denies pain, LBM this morning per pt, urinating ok. Denies any other complaints or concerns.   ROS: as per HPI. Denies fever, Chills, CP, shortness of breath abd pain, N/V/D/C, or any other complaints at this time.      Objective:   No results found. No results for input(s): "WBC", "HGB", "HCT", "PLT" in the last 72 hours.  Recent Labs    02/26/23 1139  NA 137  K 3.9  CL 104  CO2 24  GLUCOSE 96  BUN 22  CREATININE 1.20  CALCIUM 10.1        Intake/Output Summary (Last 24 hours) at 03/01/2023 1034 Last data filed at 03/01/2023 0736 Gross per 24 hour  Intake 240 ml  Output 275 ml  Net -35 ml         Physical Exam: Vital Signs Blood pressure 134/84, pulse 80, temperature 98.3 F (36.8 C), temperature source Oral, resp. rate 16, height 5\' 9"  (1.753 m), weight 88.9 kg, SpO2 93%.  Constitutional: NAD, sitting up in bedside chair HENT:     Head: Normocephalic and atraumatic.     Nose: Nose normal.     Mouth/Throat:     Mouth: Mucous membranes are moist.  Eyes:     Extraocular Movements: Extraocular movements intact. Occasionally staring to TV but very briefly and tracks again with verbal cue to look in another direction    Conjunctiva/sclera: Conjunctivae normal.     Pupils: Pupils are equal, round, and reactive to light.  Cardiovascular:     Rate and Rhythm: Normal rate and regular rhythm.     Heart sounds: Murmur heard.   Pulmonary:     Effort: Pulmonary effort is normal. No respiratory distress.     Breath sounds: No stridor. No wheezing or rhonchi.  Abdominal:     General: Bowel sounds are normal. There is no distension.     Palpations: Abdomen is soft.     Tenderness: There is no abdominal tenderness.  Musculoskeletal:        General: Swelling (trace LUE) present. Improving    Cervical back: Normal range of motion.      No pedal edema appreciated Skin:    General: Skin is warm and dry.     Findings: No bruising or erythema.  Psychiatric:     Comments: Pt pleasant and cooperative  Neuro: alert, slightly dysarthric which is baseline, occasional staring off but no confusion and no postictal period.   PRIOR EXAMS: Neurological:     Mental Status: He is alert.     Comments: Alert and oriented x 4.  Slightly dysarthric speech.  Fair insight and awareness.  Language overall  appears to be intact.  Left central 7.  Decreased sensation to light touch in left upper extremity and left lower extremity.  Moving right upper extremity and right lower extremity to gravity. Mildly increased tone R hand.    Assessment/Plan: 1. Functional deficits which require 3+ hours per day of interdisciplinary therapy in a comprehensive inpatient rehab setting. Physiatrist is providing close team supervision and 24 hour management of active medical problems listed below. Physiatrist and rehab team continue to assess barriers to discharge/monitor patient progress toward functional and medical goals  Care Tool:  Bathing    Body parts bathed by patient: Left arm, Chest, Abdomen, Front perineal area, Right upper leg, Left upper leg, Right lower leg, Face   Body parts bathed by helper: Right arm, Buttocks, Left lower leg     Bathing assist Assist Level: Moderate Assistance - Patient 50 - 74%     Upper Body Dressing/Undressing Upper body dressing        Upper body assist Assist Level: Moderate Assistance - Patient 50 -  74%    Lower Body Dressing/Undressing Lower body dressing            Lower body assist Assist for lower body dressing: Maximal Assistance - Patient 25 - 49%     Toileting Toileting    Toileting assist Assist for toileting: Maximal Assistance - Patient 25 - 49%     Transfers Chair/bed transfer  Transfers assist     Chair/bed transfer assist level: 2 Helpers     Locomotion Ambulation   Ambulation assist   Ambulation activity did not occur: Safety/medical concerns  Assist level: 2 helpers Assistive device:  (handrail) Max distance: 15 feet   Walk 10 feet activity   Assist  Walk 10 feet activity did not occur: Safety/medical concerns  Assist level: 2 helpers Assistive device:  (handrail)   Walk 50 feet activity   Assist Walk 50 feet with 2 turns activity did not occur: Safety/medical concerns         Walk 150 feet activity   Assist Walk 150 feet activity did not occur: Safety/medical concerns         Walk 10 feet on uneven surface  activity   Assist Walk 10 feet on uneven surfaces activity did not occur: Safety/medical concerns         Wheelchair     Assist Is the patient using a wheelchair?: Yes Type of Wheelchair: Manual    Wheelchair assist level: Minimal Assistance - Patient > 75% Max wheelchair distance: 50    Wheelchair 50 feet with 2 turns activity    Assist        Assist Level: Minimal Assistance - Patient > 75%   Wheelchair 150 feet activity     Assist      Assist Level: Maximal Assistance - Patient 25 - 49%   Blood pressure 134/84, pulse 80, temperature 98.3 F (36.8 C), temperature source Oral, resp. rate 16, height 5\' 9"  (1.753 m), weight 88.9 kg, SpO2 93%.  Medical Problem List and Plan: 1. 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  Await plan for loop recorder             -patient may shower -ELOS/Goals: 21-25  days, min assist with PT, OT and supervision with SLP -Continue CIR -Expected discharge 03/14/2023- reviewed date with patient  2.  Antithrombotics: -DVT/anticoagulation:  Pharmaceutical: Heparin 5000U q8h -antiplatelet therapy: Aspirin 81 mg daily  and Plavix 75 mg day x 3 weeks then Plavix alone 3. Pain Management: Lidoderm patch as directed, Tylenol as needed 4. Mood/Sleep/sundowning: Effexor 150 mg every evening, provide emotional support             -antipsychotic agents: Seroquel 50 mg nighty at 2030, have back up dose             -keep sleep chart 5. Neuropsych/cognition: This patient is capable of making decisions on his own behalf. 6. Skin/Wound Care: Routine skin checks 7. Fluids/Electrolytes/Nutrition: Routine in and outs with follow-up chemistries, continue vitamins/supplements 8.  Hypothyroidism.  Synthroid 43mcg/day 9.  Hypertension. Norvasc 10 mg daily, Toprol-Xl 100 mg daily, Aldactone 25 mg daily.             -bp trending up. Likely will need additions to regimen  -02/22/23 BPs 140s/90s, not terrible, monitor trend with increase mobility  2/10 BP controlled, decrease spironolactone due to elevated CR/BUN -02/28/23 BP stable but creeping up, continue to monitor for now but may need to increase meds back -03/01/23 BP a little better this morning, monitor for now  Vitals:   02/25/23 1401 02/25/23 1941 02/26/23 0514 02/26/23 1601  BP: (!) 149/76 (!) 150/83 (!) 156/95 103/78   02/26/23 1949 02/27/23 0431 02/27/23 1626 02/27/23 2010  BP: 133/83 (!) 147/77 (!) 134/102 (!) 141/88   02/28/23 0516 02/28/23 1418 02/28/23 1939 03/01/23 0437  BP: (!) 140/78 (!) 148/80 (!) 158/97 134/84    10.H/O prostate cancer. Myrbetriq 50 mg daily. 11.Hyperlipidemia. Lipitor 80mg  daily 12.Constipation.             -miralax daily, SenokotS 1 tab daily -02/22/23 LBM 2days ago, monitor today, if no BM tomorrow then may want to increase regimen -03/01/23 LBM this morning per pt 13.H/O gout. Zyloprim  300mg  daily 14.  AKI versus CKD  -Encourage oral fluid intake, decrease spironolactone to 12.5 mg  -2/11 recheck BMP today  Addendum- increased Cr, stop spironolactone, start IVF NS 60ml/HR  -2/12 Cr slightly improved, continue IVF, encourage oral fluids  -2/13 recheck BMP- CR down to 1.2  -2/14 DC IVF, discussed oral hydration 15.  History of CHF.  Last EF 55%, mild diastolic dysfunction  -Continue metoprolol, decrease spironolactone to 12.5 mg as above -2/14 Wts stable, has not appear to show signs of fluid overload, continue to monitor -02/28/23 wt up but doubt accuracy, monitor, no overload on exam -03/01/23 wt down but again, doubt accuracy; looks fine.   Filed Weights   02/27/23 0432 02/28/23 0801 03/01/23 0437  Weight: 91.8 kg 95.4 kg 88.9 kg    16.  Severe cervical stenosis: Consider neurosurgery outpatient follow-up 17. Insomnia -Patient has Seroquel 50 mg scheduled and 25 mg as needed.  Will also add in 3 mg melatonin at bedtime  -2/11 increase Seroquel to 75 mg nightly  -2/13 asked nursing to complete sleep-wake chart  -2/14 reports sleeping better, continue current regimen for now and monitor  18.  Bleeding from his penis  -UA neg,HGB was stable on 2/11 -Denies additional occurrences.  During team meeting was noted this happened intermittently during acute hospital stay  19. Nasal congestion: Flonase 1 spray daily  20. Staring episodes/vision changes 03/01/23 -03/01/23 having staring episodes and ?chronic vision changes/hemianopsia? Spoke with Dr. Derry Lory of neuro, will get 24hr EEG and head CT, no code stroke at this point; they will see pt, appreciate their assistance.   I spent >102mins performing patient care related activities, including face to face time, documentation  time, discussion of neuro change with patient and staff and neuro consult, and overall coordination of care.   LOS: 8 days A FACE TO FACE EVALUATION WAS PERFORMED  [address] 03/01/2023,  10:34 AM

## 2023-03-01 NOTE — Plan of Care (Signed)
  Problem: Consults Goal: RH STROKE PATIENT EDUCATION Description: See Patient Education module for education specifics  Outcome: Progressing   Problem: RH BOWEL ELIMINATION Goal: RH STG MANAGE BOWEL WITH ASSISTANCE Description: STG Manage Bowel with min Assistance. Outcome: Progressing Goal: RH STG MANAGE BOWEL W/MEDICATION W/ASSISTANCE Description: STG Manage Bowel with Medication with mod I  Assistance. Outcome: Progressing   Problem: RH BLADDER ELIMINATION Goal: RH STG MANAGE BLADDER WITH ASSISTANCE Description: STG Manage Bladder With min Assistance Outcome: Progressing Goal: RH STG MANAGE BLADDER WITH MEDICATION WITH ASSISTANCE Description: STG Manage Bladder With Medication With mod I Assistance. Outcome: Progressing   Problem: RH SKIN INTEGRITY Goal: RH STG SKIN FREE OF INFECTION/BREAKDOWN Description: Manage skin w min assist Outcome: Progressing Goal: RH STG MAINTAIN SKIN INTEGRITY WITH ASSISTANCE Description: STG Maintain Skin Integrity With min Assistance. Outcome: Progressing   Problem: RH SAFETY Goal: RH STG ADHERE TO SAFETY PRECAUTIONS W/ASSISTANCE/DEVICE Description: STG Adhere to Safety Precautions With cues  Assistance/Device. Outcome: Progressing   Problem: RH COGNITION-NURSING Goal: RH STG USES MEMORY AIDS/STRATEGIES W/ASSIST TO PROBLEM SOLVE Description: STG Uses Memory Aids/Strategies With cues Assistance to Problem Solve. Outcome: Progressing   Problem: RH KNOWLEDGE DEFICIT Goal: RH STG INCREASE KNOWLEDGE OF DIABETES Description: Patient and wife will be able to manage prediabetes using educational resources for medications and dietary modification independently Outcome: Progressing Goal: RH STG INCREASE KNOWLEDGE OF HYPERTENSION Description: Patient and wife will be able to manage HTN using educational resources for medications and dietary modification independently Outcome: Progressing Goal: RH STG INCREASE KNOWLEGDE OF  HYPERLIPIDEMIA Description: Patient and wife will be able to manage HLD using educational resources for medications and dietary modification independently Outcome: Progressing Goal: 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

## 2023-03-02 ENCOUNTER — Inpatient Hospital Stay (HOSPITAL_COMMUNITY): Payer: Medicare Other

## 2023-03-02 ENCOUNTER — Encounter (HOSPITAL_COMMUNITY): Payer: Medicare Other

## 2023-03-02 DIAGNOSIS — R569 Unspecified convulsions: Secondary | ICD-10-CM | POA: Diagnosis not present

## 2023-03-02 DIAGNOSIS — K5900 Constipation, unspecified: Secondary | ICD-10-CM | POA: Diagnosis not present

## 2023-03-02 DIAGNOSIS — N179 Acute kidney failure, unspecified: Secondary | ICD-10-CM | POA: Diagnosis not present

## 2023-03-02 DIAGNOSIS — I63531 Cerebral infarction due to unspecified occlusion or stenosis of right posterior cerebral artery: Secondary | ICD-10-CM | POA: Diagnosis not present

## 2023-03-02 DIAGNOSIS — I10 Essential (primary) hypertension: Secondary | ICD-10-CM | POA: Diagnosis not present

## 2023-03-02 LAB — CBC
HCT: 36.7 % — ABNORMAL LOW (ref 39.0–52.0)
Hemoglobin: 11.7 g/dL — ABNORMAL LOW (ref 13.0–17.0)
MCH: 27.9 pg (ref 26.0–34.0)
MCHC: 31.9 g/dL (ref 30.0–36.0)
MCV: 87.6 fL (ref 80.0–100.0)
Platelets: 194 10*3/uL (ref 150–400)
RBC: 4.19 MIL/uL — ABNORMAL LOW (ref 4.22–5.81)
RDW: 14.4 % (ref 11.5–15.5)
WBC: 6 10*3/uL (ref 4.0–10.5)
nRBC: 0 % (ref 0.0–0.2)

## 2023-03-02 LAB — BASIC METABOLIC PANEL
Anion gap: 12 (ref 5–15)
BUN: 17 mg/dL (ref 8–23)
CO2: 22 mmol/L (ref 22–32)
Calcium: 9.6 mg/dL (ref 8.9–10.3)
Chloride: 103 mmol/L (ref 98–111)
Creatinine, Ser: 1.25 mg/dL — ABNORMAL HIGH (ref 0.61–1.24)
GFR, Estimated: >60 mL/min (ref >60)
Glucose, Bld: 101 mg/dL — ABNORMAL HIGH (ref 70–99)
Potassium: 4.2 mmol/L (ref 3.5–5.1)
Sodium: 137 mmol/L (ref 135–145)

## 2023-03-02 NOTE — Progress Notes (Signed)
 NEUROLOGY CONSULT FOLLOW UP NOTE   Date of service: March 02, 2023 Patient Name: Edward Rodgers MRN:  [id number] DOB:  [birth year]  Interval Hx/subjective   No seizures seen on LTM EEG read.  Neurology exam consistent, no acute events overnight.  Vitals   Vitals:   03/01/23 1248 03/01/23 1929 03/02/23 0446 03/02/23 0607  BP: 133/82 (!) 153/88 (!) 142/92   Pulse: 73 88 78   Resp: 17 17 18    Temp: 98.8 F (37.1 C) 98.3 F (36.8 C) 98.4 F (36.9 C)   TempSrc: Oral Oral Oral   SpO2: 93% 96% 94%   Weight:    88.7 kg  Height:         Body mass index is 28.88 kg/m.  Physical Exam   Constitutional: Appears well-developed and well-nourished.  Psych: Affect appropriate to situation.   Head: Normocephalic.  Cardiovascular: Normal rate and regular rhythm.  Respiratory: Effort normal, non-labored breathing.   Neurologic Examination   NEURO:  Mental Status: Alert and oriented to person place time and situation, able to give clear and coherent history of present illness Speech/Language: speech is without dysarthria or aphasia.     Cranial Nerves:  II: PERRL. Visual fields full.  III, IV, VI: EOMI. Eyelids elevate symmetrically.  V: Sensation is intact to light touch and diminished on the left VII: Smile is symmetrical.  VIII: hearing intact to voice. IX, X: Phonation is normal.  WU:[id number] shrug 5/5. XII: tongue is midline without fasciculations.  Motor:  5/5 strength to right upper and lower extremities 3/5 strength to left upper extremity 4/5 strength to left lower extremity Tone: is normal and bulk is normal Sensation- Intact to light touch bilaterally.  Coordination: FTN intact on the right, unable to perform on the left Gait- deferred  Medications  Current Facility-Administered Medications:    acetaminophen (TYLENOL) tablet 650 mg, 650 mg, Oral, Q4H PRN, 650 mg at 02/26/23 2009 **OR** acetaminophen (TYLENOL) 160 MG/5ML solution 650 mg, 650 mg, Per Tube, Q4H  PRN **OR** acetaminophen (TYLENOL) suppository 650 mg, 650 mg, Rectal, Q4H PRN, Angiulli, Mcarthur Rossetti, PA-C   allopurinol (ZYLOPRIM) tablet 300 mg, 300 mg, Oral, q morning, Angiulli, Mcarthur Rossetti, PA-C, 300 mg at 03/02/23 0912   amLODipine (NORVASC) tablet 10 mg, 10 mg, Oral, Daily, Angiulli, Mcarthur Rossetti, PA-C, 10 mg at 03/02/23 8295   aspirin EC tablet 81 mg, 81 mg, Oral, Daily, Angiulli, Mcarthur Rossetti, PA-C, 81 mg at 03/02/23 0912   atorvastatin (LIPITOR) tablet 80 mg, 80 mg, Oral, Daily, Angiulli, Mcarthur Rossetti, PA-C, 80 mg at 03/02/23 0913   calcium-vitamin D (OSCAL WITH D) 500-5 MG-MCG per tablet 1 tablet, 1 tablet, Oral, Daily, Angiulli, Mcarthur Rossetti, PA-C, 1 tablet at 03/02/23 0913   clopidogrel (PLAVIX) tablet 75 mg, 75 mg, Oral, Daily, Angiulli, Mcarthur Rossetti, PA-C, 75 mg at 03/02/23 0912   feeding supplement (ENSURE ENLIVE / ENSURE PLUS) liquid 237 mL, 237 mL, Oral, BID BM, Angiulli, Daniel J, PA-C, 237 mL at 03/02/23 0913   fluticasone (FLONASE) 50 MCG/ACT nasal spray 1 spray, 1 spray, Each Nare, Daily, Fanny Dance, MD, 1 spray at 03/02/23 0913   heparin injection 5,000 Units, 5,000 Units, Subcutaneous, Q8H, Angiulli, Mcarthur Rossetti, PA-C, 5,000 Units at 03/02/23 0522   latanoprost (XALATAN) 0.005 % ophthalmic solution 1 drop, 1 drop, Right Eye, QHS, Angiulli, Mcarthur Rossetti, PA-C, 1 drop at 03/01/23 2016   levothyroxine (SYNTHROID) tablet 88 mcg, 88 mcg, Oral, QAC breakfast, Charlton Amor, PA-C, 88 mcg at 03/02/23  0522   lidocaine (LIDODERM) 5 % 1 patch, 1 patch, Transdermal, Q24H, Angiulli, Mcarthur Rossetti, PA-C, 1 patch at 03/02/23 0911   melatonin tablet 3 mg, 3 mg, Oral, QHS, Fanny Dance, MD, 3 mg at 03/01/23 2015   metoprolol succinate (TOPROL-XL) 24 hr tablet 100 mg, 100 mg, Oral, Daily, Angiulli, Mcarthur Rossetti, PA-C, 100 mg at 03/02/23 0912   mirabegron ER (MYRBETRIQ) tablet 50 mg, 50 mg, Oral, Daily, Angiulli, Mcarthur Rossetti, PA-C, 50 mg at 03/02/23 0912   ondansetron (ZOFRAN-ODT) disintegrating tablet 4 mg, 4 mg, Oral,  Q8H PRN, Angiulli, Mcarthur Rossetti, PA-C, 4 mg at 02/28/23 1601   polyethylene glycol (MIRALAX / GLYCOLAX) packet 17 g, 17 g, Oral, Daily, Angiulli, Mcarthur Rossetti, PA-C, 17 g at 03/02/23 0913   QUEtiapine (SEROQUEL) tablet 25 mg, 25 mg, Oral, QHS PRN, Ranelle Oyster, MD, 25 mg at 02/27/23 2102   QUEtiapine (SEROQUEL) tablet 75 mg, 75 mg, Oral, QHS, Fanny Dance, MD, 75 mg at 03/01/23 2015   senna-docusate (Senokot-S) tablet 1 tablet, 1 tablet, Oral, QHS, Ranelle Oyster, MD, 1 tablet at 03/01/23 2015   sodium chloride (OCEAN) 0.65 % nasal spray 1 spray, 1 spray, Each Nare, PRN, Angiulli, Mcarthur Rossetti, PA-C   venlafaxine XR (EFFEXOR-XR) 24 hr capsule 150 mg, 150 mg, Oral, QPM, Street, [place], [place], 150 mg at 03/01/23 1808  Labs and Diagnostic Imaging   CBC:  Recent Labs  Lab 02/24/23 1020 03/02/23 0545  WBC 5.3 6.0  NEUTROABS 3.1  --   HGB 13.0 11.7*  HCT 40.2 36.7*  MCV 86.8 87.6  PLT 189 194    Basic Metabolic Panel:  Lab Results  Component Value Date   NA 137 03/02/2023   K 4.2 03/02/2023   CO2 22 03/02/2023   GLUCOSE 101 (H) 03/02/2023   BUN 17 03/02/2023   CREATININE 1.25 (H) 03/02/2023   CALCIUM 9.6 03/02/2023   GFRNONAA >60 03/02/2023   GFRAA >90 03/29/2013   Lipid Panel:  Lab Results  Component Value Date   LDLCALC 30 02/15/2023   HgbA1c:  Lab Results  Component Value Date   HGBA1C 6.4 (H) 02/15/2023   Urine Drug Screen:     Component Value Date/Time   LABOPIA NONE DETECTED 02/14/2023 0615   COCAINSCRNUR NONE DETECTED 02/14/2023 0615   LABBENZ NONE DETECTED 02/14/2023 0615   AMPHETMU NONE DETECTED 02/14/2023 0615   THCU NONE DETECTED 02/14/2023 0615   LABBARB NONE DETECTED 02/14/2023 0615    Alcohol Level     Component Value Date/Time   ETH <10 02/14/2023 0406   INR  Lab Results  Component Value Date   INR 1.0 02/14/2023   APTT  Lab Results  Component Value Date   APTT 23 (L) 02/14/2023   AED levels: No results found for: "PHENYTOIN",  "ZONISAMIDE", "LAMOTRIGINE", "LEVETIRACETA"  CT Head without contrast(Personally reviewed): No acute abnormality, left parafalcine meningioma   MRI Brain(Personally reviewed): Right PCA territory stroke, right thalamic infarct, new punctate infarct in left occipital lobe, left anterior parafalcine meningioma, chronic small vessel ischemic disease  LTM EEG 2/17:  This study is within normal limits. No seizures or epileptiform discharges were seen throughout the recording.   Assessment   MILIND RAETHER is a 73 y.o. male with history of hypertension, hypothyroidism, gout, cerebellar stroke and recent right PCA stroke.   The patient is currently in rehabilitation after his PCA stroke.  He reports some staring spells which he states have been happening for at least the past few  months.  Patient states that sometimes he catches himself just staring at objects in the environment and at other times he is unaware that these spells happen.  Patient's family states they have not observed any abnormal movements when the spells happen.   No spells were captured overnight, no seizures seen on LTM EEG.  Likely this is continuation of episodes patient has stated has been going on since prior to his stroke.  LTM EEG negative.   Recommendations   - Discontinue LTM EEG  Neurology will sign off. Please recall with further concerns.  ______________________________________________________________________    Pt seen by Neuro NP/APP and later by MD. Note/plan to be edited by MD as needed.    Lynnae January, DNP, AGACNP-BC [facility] Please use AMION for contact information & EPIC for messaging.   NEUROHOSPITALIST ADDENDUM Performed a face to face diagnostic evaluation.   I have reviewed the contents of history and physical exam as documented by PA/ARNP/Resident and agree with above documentation.  I have discussed and formulated the above plan as documented. Edits to the note have been  made as needed.  Impression/Key exam findings/Plan: no epileptiform discharges or seizures on LTM. Clinical description of the episodes do not seem concerning for seizures.  We will signoff. Discontinue LTM EEG.  Erick Blinks, MD [facility] [phone]   If 7pm to 7am, please call on call as listed on AMION.

## 2023-03-02 NOTE — Progress Notes (Signed)
 PROGRESS NOTE   Subjective/Complaints:  Overnight EEG did not show seizure activity last night. Pt reports he feels well overall. No new complaints. He reports he wanted to ask me something but forgot what it was.   ROS: as per HPI. Denies fever, HA, CP, shortness of breath abd pain, N/V/D/C, or any other complaints at this time.      Objective:   Overnight EEG with video Result Date: 03/02/2023 Charlsie Quest, MD     03/02/2023  9:22 AM Patient Name: Edward Rodgers MRN: [id number] Epilepsy Attending: Charlsie Quest Referring Physician/Provider: Erick Blinks, MD Duration: 03/01/2023 1149 to 01/30/2023 0915 Patient history: 73yo make with staring spells. EEG to evaluate for seizure Level of alertness: Awake, asleep AEDs during EEG study: None Technical aspects: This EEG study was done with scalp electrodes positioned according to the 10-20 International system of electrode placement. Electrical activity was reviewed with band pass filter of 1-70Hz , sensitivity of 7 uV/mm, display speed of 37mm/sec with a 60Hz  notched filter applied as appropriate. EEG data were recorded continuously and digitally stored.  Video monitoring was available and reviewed as appropriate. Description: The posterior dominant rhythm consists of 8.5 Hz activity of moderate voltage (25-35 uV) seen predominantly in posterior head regions, symmetric and reactive to eye opening and eye closing. Sleep was characterized by vertex waves, sleep spindles (12 to 14 Hz), maximal frontocentral region. Hyperventilation and photic stimulation were not performed.   IMPRESSION: This study is within normal limits. No seizures or epileptiform discharges were seen throughout the recording. A normal interictal EEG does not exclude the diagnosis of epilepsy. Priyanka Annabelle Harman   CT HEAD WO CONTRAST ( ) Result Date: 03/01/2023 CLINICAL DATA:  Initial evaluation for acute confusion,  vision change. EXAM: CT HEAD WITHOUT CONTRAST TECHNIQUE: Contiguous axial images were obtained from the base of the skull through the vertex without intravenous contrast. RADIATION DOSE REDUCTION: This exam was performed according to the departmental dose-optimization program which includes automated exposure control, adjustment of the mA and/or kV according to patient size and/or use of iterative reconstruction technique. COMPARISON:  Comparison made with prior CT from 02/16/2023 and MRI from 02/15/2023 FINDINGS: Brain: Previously identified evolving subacute right PCA distribution infarct again seen, overall relatively stable in size and distribution as compared to previous MRI. No evidence for hemorrhagic transformation. No significant regional mass effect. No other new acute cortically based infarct. No acute intracranial hemorrhage. Left parafalcine calcified meningioma overlying the anterior left frontal convexity again noted, stable. No hydrocephalus or extra-axial fluid collection. Vascular: No abnormal hyperdense vessel. Scattered vascular calcifications noted within the carotid siphons. Skull: Scalp soft tissues demonstrate no acute finding. Calvarium intact. Sinuses/Orbits: Globes and orbital soft tissues demonstrate no acute finding. Visualized paranasal sinuses and mastoid air cells remain largely clear. Other: None. IMPRESSION: 1. Evolving subacute right PCA distribution infarct, overall relatively stable in size and distribution as compared to previous MRI. No evidence for hemorrhagic transformation or significant regional mass effect. 2. No other new acute intracranial abnormality. 3. Stable left parafalcine calcified meningioma overlying the anterior left frontal convexity. Electronically Signed   By: Rise Mu M.D.   On: 03/01/2023 19:08  Recent Labs    03/02/23 0545  WBC 6.0  HGB 11.7*  HCT 36.7*  PLT 194    Recent Labs    03/02/23 0545  NA 137  K 4.2  CL 103  CO2 22   GLUCOSE 101*  BUN 17  CREATININE 1.25*  CALCIUM 9.6        Intake/Output Summary (Last 24 hours) at 03/02/2023 0940 Last data filed at 03/02/2023 0440 Gross per 24 hour  Intake --  Output 150 ml  Net -150 ml         Physical Exam: Vital Signs Blood pressure (!) 142/92, pulse 78, temperature 98.4 F (36.9 C), temperature source Oral, resp. rate 18, height 5\' 9"  (1.753 m), weight 88.7 kg, SpO2 94%.  Constitutional: NAD, Laying in bed HENT:     Head: Normocephalic and atraumatic.     Nose: Nose normal.     Mouth/Throat:     Mouth: Mucous membranes are moist.  EEG leads in place Eyes:     Extraocular Movements: Extraocular movements intact.     Conjunctiva/sclera: Conjunctivae normal.     Pupils: Pupils are equal, round, and reactive to light.  Cardiovascular:     Rate and Rhythm: Normal rate and regular rhythm.     Heart sounds: Murmur heard.  Pulmonary:     Effort: Pulmonary effort is normal. No respiratory distress.     Breath sounds: No stridor. No wheezing or rhonchi.  Abdominal:     General: Bowel sounds are normal. There is no distension.     Palpations: Abdomen is soft.     Tenderness: There is no abdominal tenderness.  Musculoskeletal:        General: Swelling (trace LUE) present. Improving    Cervical back: Normal range of motion.      No pedal edema appreciated Skin:    General: Skin is warm and dry.     Findings: No bruising or erythema.  Psychiatric:     Comments: Pt pleasant and cooperative  Neuro: alert, slightly dysarthric which is baseline, occasional staring off but no confusion and no postictal period.   PRIOR EXAMS: Neurological:     Mental Status: He is alert.     Comments: Alert and oriented x 4.  Slightly dysarthric speech.  Fair insight and awareness.  Language overall appears to be intact.  Left central 7.  Decreased sensation to light touch in left upper extremity and left lower extremity.  Moving right upper extremity and right lower  extremity to gravity. Mildly increased tone R hand.    Assessment/Plan: 1. Functional deficits which require 3+ hours per day of interdisciplinary therapy in a comprehensive inpatient rehab setting. Physiatrist is providing close team supervision and 24 hour management of active medical problems listed below. Physiatrist and rehab team continue to assess barriers to discharge/monitor patient progress toward functional and medical goals  Care Tool:  Bathing    Body parts bathed by patient: Left arm, Chest, Abdomen, Front perineal area, Right upper leg, Left upper leg, Right lower leg, Face   Body parts bathed by helper: Right arm, Buttocks, Left lower leg     Bathing assist Assist Level: Moderate Assistance - Patient 50 - 74%     Upper Body Dressing/Undressing Upper body dressing        Upper body assist Assist Level: Moderate Assistance - Patient 50 - 74%    Lower Body Dressing/Undressing Lower body dressing            Lower  body assist Assist for lower body dressing: Maximal Assistance - Patient 25 - 49%     Toileting Toileting    Toileting assist Assist for toileting: Maximal Assistance - Patient 25 - 49%     Transfers Chair/bed transfer  Transfers assist     Chair/bed transfer assist level: 2 Helpers     Locomotion Ambulation   Ambulation assist   Ambulation activity did not occur: Safety/medical concerns  Assist level: 2 helpers Assistive device:  (handrail) Max distance: 15 feet   Walk 10 feet activity   Assist  Walk 10 feet activity did not occur: Safety/medical concerns  Assist level: 2 helpers Assistive device:  (handrail)   Walk 50 feet activity   Assist Walk 50 feet with 2 turns activity did not occur: Safety/medical concerns         Walk 150 feet activity   Assist Walk 150 feet activity did not occur: Safety/medical concerns         Walk 10 feet on uneven surface  activity   Assist Walk 10 feet on uneven  surfaces activity did not occur: Safety/medical concerns         Wheelchair     Assist Is the patient using a wheelchair?: Yes Type of Wheelchair: Manual    Wheelchair assist level: Minimal Assistance - Patient > 75% Max wheelchair distance: 50    Wheelchair 50 feet with 2 turns activity    Assist        Assist Level: Minimal Assistance - Patient > 75%   Wheelchair 150 feet activity     Assist      Assist Level: Maximal Assistance - Patient 25 - 49%   Blood pressure (!) 142/92, pulse 78, temperature 98.4 F (36.9 C), temperature source Oral, resp. rate 18, height 5\' 9"  (1.753 m), weight 88.7 kg, SpO2 94%.  Medical Problem List and Plan: 1. 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  Await plan for loop recorder             -patient may shower -ELOS/Goals: 21-25 days, min assist with PT, OT and supervision with SLP -Continue CIR -Expected discharge 03/14/2023  2.  Antithrombotics: -DVT/anticoagulation:  Pharmaceutical: Heparin 5000U q8h -antiplatelet therapy: Aspirin 81 mg daily and Plavix 75 mg day x 3 weeks then Plavix alone 3. Pain Management: Lidoderm patch as directed, Tylenol as needed 4. Mood/Sleep/sundowning: Effexor 150 mg every evening, provide emotional support             -antipsychotic agents: Seroquel 50 mg nighty at 2030, have back up dose             -keep sleep chart 5. Neuropsych/cognition: This patient is capable of making decisions on his own behalf. 6. Skin/Wound Care: Routine skin checks 7. Fluids/Electrolytes/Nutrition: Routine in and outs with follow-up chemistries, continue vitamins/supplements 8.  Hypothyroidism.  Synthroid 22mcg/day 9.  Hypertension. Norvasc 10 mg daily, Toprol-Xl 100 mg daily, Aldactone 25 mg daily.             -bp trending up. Likely will need additions to regimen  -02/22/23 BPs 140s/90s, not terrible, monitor trend with increase  mobility  2/10 BP controlled, decrease spironolactone due to elevated CR/BUN -02/28/23 BP stable but creeping up, continue to monitor for now but may need to increase meds back -2/17 BP fair control, continue to monitor  Vitals:   02/26/23 1601 02/26/23 1949 02/27/23 0431 02/27/23 1626  BP: 103/78 133/83 Marland Kitchen)  147/77 (!) 134/102   02/27/23 2010 02/28/23 0516 02/28/23 1418 02/28/23 1939  BP: (!) 141/88 (!) 140/78 (!) 148/80 (!) 158/97   03/01/23 0437 03/01/23 1248 03/01/23 1929 03/02/23 0446  BP: 134/84 133/82 (!) 153/88 (!) 142/92    10.H/O prostate cancer. Myrbetriq 50 mg daily. 11.Hyperlipidemia. Lipitor 80mg  daily 12.Constipation.             -miralax daily, SenokotS 1 tab daily -02/22/23 LBM 2days ago, monitor today, if no BM tomorrow then may want to increase regimen -2/17 LBM yesterday 13.H/O gout. Zyloprim 300mg  daily 14.  AKI versus CKD  -Encourage oral fluid intake, decrease spironolactone to 12.5 mg  -2/11 recheck BMP today  Addendum- increased Cr, stop spironolactone, start IVF NS 88ml/HR  -2/12 Cr slightly improved, continue IVF, encourage oral fluids  -2/13 recheck BMP- CR down to 1.2  -2/14 DC IVF, discussed oral hydration  2/17 BUN/CR stable 17/1.25 15.  History of CHF.  Last EF 55%, mild diastolic dysfunction  -Continue metoprolol, decrease spironolactone to 12.5 mg as above -2/14 Wts stable, has not appear to show signs of fluid overload, continue to monitor -02/28/23 wt up but doubt accuracy, monitor, no overload on exam -03/01/23 wt down but again, doubt accuracy; looks fine.   Filed Weights   02/28/23 0801 03/01/23 0437 03/02/23 0607  Weight: 95.4 kg 88.9 kg 88.7 kg    16.  Severe cervical stenosis: Consider neurosurgery outpatient follow-up 17. Insomnia -Patient has Seroquel 50 mg scheduled and 25 mg as needed.  Will also add in 3 mg melatonin at bedtime  -2/11 increase Seroquel to 75 mg nightly  -2/13 asked nursing to complete sleep-wake chart  -2/14 reports  sleeping better, continue current regimen for now and monitor  18.  Bleeding from his penis  -UA neg,HGB was stable on 2/11 -Denies additional occurrences.  During team meeting was noted this happened intermittently during acute hospital stay  19. Nasal congestion: Flonase 1 spray daily  20. Staring episodes/vision changes 03/01/23 -03/01/23 having staring episodes and ?chronic vision changes/hemianopsia? Spoke with Dr. Derry Lory of neuro, will get 24hr EEG and head CT, no code stroke at this point; they will see pt, appreciate their assistance.  -2/17 overnight EEG neg, CT head without acute changes   LOS: 9 days A FACE TO FACE EVALUATION WAS PERFORMED  Fanny Dance 03/02/2023, 9:40 AM

## 2023-03-02 NOTE — Progress Notes (Signed)
 Physical Therapy Weekly Progress Note  Patient Details  Name: Edward Rodgers MRN: [id number] Date of Birth: May 20, 1950  Beginning of progress report period: February 22, 2023 End of progress report period: March 02, 2023  Today's Date: 03/02/2023 PT Individual Time: 1020-1102, 1300-1411 PT Individual Time Calculation (min): 42 min, 71 min   Patient has met 4 of 4 short term goals.  Pt is completing bed mobility with use of bed rail from flat bed with min A for trunk support. Sit to stand no AD and min A, and with RW and light min A 2/2 posterior bias. Pt is performing squat pivot transfer with min-mod A, and stand pivot transfer with mod A, verbal cues provided for sequencing, Pt is ambulating 50 feet with RW and L UE hand splint on RW with +2 for WC follow, and therapist providing min A for management of RW, pt relies heavily on numbered dots on floor as visual cue for improved gait pattern 2/2 decreased proprioception and L LE scissoring without visual cuing however improving with practice. Discharge scheduled for 3/1. Upgraded sit to stand goal to CGA 2/2 pt progressing quicker than anticipated.   Patient continues to demonstrate the following deficits muscle weakness, decreased cardiorespiratoy endurance, impaired timing and sequencing, decreased coordination, and decreased motor planning, decreased midline orientation, decreased attention to left, left side neglect, and decreased motor planning, decreased initiation, decreased attention, decreased awareness, decreased problem solving, decreased safety awareness, decreased memory, and delayed processing, and decreased sitting balance, decreased standing balance, decreased postural control, hemiplegia, and decreased balance strategies and therefore will continue to benefit from skilled PT intervention to increase functional independence with mobility.  Patient progressing toward long term goals..  Continue plan of care.  PT Short Term  Goals Week 1:  PT Short Term Goal 1 (Week 1): pt will perfrom sit to stand with LRAD and mod A PT Short Term Goal 1 - Progress (Week 1): Met PT Short Term Goal 2 (Week 1): pt will perform bed to chair transfer with LRAD and mod A PT Short Term Goal 2 - Progress (Week 1): Met PT Short Term Goal 3 (Week 1): Pt will perform bed mobility with LRAD and mod A PT Short Term Goal 3 - Progress (Week 1): Met PT Short Term Goal 4 (Week 1): pt will ambulate 50 feet with LRAD and +2 A PT Short Term Goal 4 - Progress (Week 1): Met Week 2:  PT Short Term Goal 1 (Week 2): pt will perform bed mobility with CGA or less PT Short Term Goal 2 (Week 2): pt will ambulate 100 feet with RW and mod A or less PT Short Term Goal 3 (Week 2): pt will perform car transfer with mod A or less with LRAD  Skilled Therapeutic Interventions/Progress Updates:      Treatment Session 1   Pt supine in bed upon arrival. Pt agreeable to therapy. Pt denies any pain.   Pt performed supine to sit with use of bed rail from bed flat with min A for trunk, verbal cues provided for technique, verbal and tactile cues provided for use of L UE, and at rib for core activation.   Pt seated EOB donned maintained dynamic seated balance with sup/min A, 2/2 posteiror bias, verbal cues provided for anterior weight shift. Pt donned pants while seated EOB with total A to feed through L LE, and min A to feed through R LE. Pt donned sock on R foot seated EOB in figure 4 position  with HOH A. Pt performed sit to stand with min A, and stood with min-mod A while donning pants with A, verbal cues provided for correction of posterior bias, and use of L UE to pull pants.   Pt reports need to use bathroom.   Squat pivot transfer with +1 mod A, pt demos improved recall of sequence, verbal and tactile cues provided for anterior weight shift.   Pt performed stand pivot transfer TIS to [facility] over toilet with mod A with use of B UE on grab bar, verbal cues provided  for sequencing and L LE positioning, therpaist guarding for L LE buckling however no evidence. Pt able to self advance L LE however needs assist for placement to reduce risk of injury.   Pt continent of urine. Pt donned/doffed briefs with total A for time conservation. Pt performed stand step transfer with mod A with same set up as described above BSC to TIS.   Pt seated in TIS WC at end of session with all needs within reach and seatbelt alarm on.   Pt brother present throughout session. At end of session therapist positioned recliner next to pt, and education provided for family to sit to L of pt to facilitate attention to L visual field, and cervical rotation to L.   Treatment Session 2   Pt seated in TIS WC upon arrival. Pt agreeable to therapy. Pt reports mild headache, and nausea, premedicated with anti nausea medication. Nurse present to administer tylenol towards end of session.   PT informed by pt family pt had EEG earlier this morning. PT followed up with PA to ensure okay to continue therapy. PA cleared pt continue therapy.   Treatment Session focused on trialing gait with RW and L UE splint on RW.   Pt performed sit to stand with RW and min A progressing to CGA, with max instructional progressing max questioning verbal cues for placement of L UE in splint and utilizing R UE to place strap, and max progressing to mod questioning cues for B LE positioning, R UE positioning and anteiror weight shift.   Pt ambulated multiple trials with RW with +2 for WC follow only!! Pt initially requiring max verbal/tactile/visual cues and min-mod A for L LE positioning 2/2 to L LE scissoring, and step to gait, however with visual cue of colored dots on floor, pt able to advance L LE with verbal cues only, and therapist providing assistance for RW management. Pt demos improved carry over of gait pattern and L LE positioning x~10 feet without colored dots.  Pt ambulated max distance of ~50 feet in one  trial, but totaled ~120 feet with repeated gait trails. Utilized [employer] as visual cue for improved midline orientation, with intermittent verbal cues for standing upright for correction of posterior bias.   Pt seated in TIS WC at end of session with all needs within reach and seatbelt alarm on.              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 Restrictions Weight Bearing Restrictions Per Provider Order: No  Therapy/Group: Individual Therapy  [facility] Ambrose Finland, [place], DPT  03/02/2023, 12:37 PM

## 2023-03-02 NOTE — Progress Notes (Signed)
 Occupational Therapy Session Note  Patient Details  Name: Edward Rodgers MRN: [id number] Date of Birth: Sep 22, 1950  Today's Date: 03/02/2023 OT Individual Time: 0905-1005 OT Individual Time Calculation (min): 60 min  and Today's Date: 03/02/2023 OT Missed Time: 10 Minutes Missed Time Reason: Other (comment) (Neurology consult)  Short Term Goals: Week 2:  OT Short Term Goal 1 (Week 2): Pt with threading LB garments with supervision, including hemi-body. OT Short Term Goal 2 (Week 2): Pt will integrate LUE in functional tasks with Min cuing. OT Short Term Goal 3 (Week 2): Pt will maintain midline orientation in static standing with Min A for >2 mins.  Skilled Therapeutic Interventions/Progress Updates:  Pt greeted resting in bed for skilled OT session. Time dedicated to verifying appropriateness for therapy session due to current connection to EEG, verbal approval from MD [facility] for bed-level activity. Meds provided during session.   Pain: Pt with no reports of pain, OT offering intermediate rest breaks and positioning suggestions throughout session to address potential pain/fatigue and maximize participation/safety in session.   Functional Transfers: Deferred this session due to EEG electrodes.   Therapeutic Activities:  Elbow flexion/extension, cue to "touch nose" Elbow internal/external rotation, cue to reach towards therapists hand at L-side, bring arm back to "give yourself a hug" Protraction/retraction at lap level to reach/retrieve washcloths, activity adjusted to including reach across midline.  Pt completes 2x5 reps, increased effort noticed but progressing to each end range with increased time. Some discomfort present at shoulder, no movements above 90 degrees completed as a result.   Pt remained resting in bed with 4Ps assessed and immediate needs met. Neurology present to assess/remove electrodes, patient missing ~10 mins of skilled intervention. Pt continues to be  appropriate for skilled OT intervention to promote further functional independence in ADLs/IADLs.   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 Restrictions Weight Bearing Restrictions Per Provider Order: No   Therapy/Group: Individual Therapy  Lou Cal, OTR/L, MSOT  03/02/2023, 6:19 AM

## 2023-03-02 NOTE — Procedures (Addendum)
 Patient Name: MYCAH FORMICA  MRN: [id number]  Epilepsy Attending: Charlsie Quest  Referring Physician/Provider: Erick Blinks, MD  Duration: 03/01/2023 1149 to 03/02/2023 1007  Patient history: 72yo make with staring spells. EEG to evaluate for seizure  Level of alertness: Awake, asleep  AEDs during EEG study: None  Technical aspects: This EEG study was done with scalp electrodes positioned according to the 10-20 International system of electrode placement. Electrical activity was reviewed with band pass filter of 1-70Hz , sensitivity of 7 uV/mm, display speed of 59mm/sec with a 60Hz  notched filter applied as appropriate. EEG data were recorded continuously and digitally stored.  Video monitoring was available and reviewed as appropriate.  Description: The posterior dominant rhythm consists of 8.5 Hz activity of moderate voltage (25-35 uV) seen predominantly in posterior head regions, symmetric and reactive to eye opening and eye closing. Sleep was characterized by vertex waves, sleep spindles (12 to 14 Hz), maximal frontocentral region. Hyperventilation and photic stimulation were not performed.     IMPRESSION: This study is within normal limits. No seizures or epileptiform discharges were seen throughout the recording.  A normal interictal EEG does not exclude the diagnosis of epilepsy.  Jaymison Luber Annabelle Harman

## 2023-03-02 NOTE — Plan of Care (Signed)
  Problem: Consults Goal: RH STROKE PATIENT EDUCATION Description: See Patient Education module for education specifics  03/02/2023 0530 by Vella Raring, RN Outcome: Progressing 03/02/2023 0530 by Vella Raring, RN Outcome: Progressing   Problem: RH BOWEL ELIMINATION Goal: RH STG MANAGE BOWEL WITH ASSISTANCE Description: STG Manage Bowel with min Assistance. 03/02/2023 0530 by Vella Raring, RN Outcome: Progressing 03/02/2023 0530 by Vella Raring, RN Outcome: Progressing Goal: RH STG MANAGE BOWEL W/MEDICATION W/ASSISTANCE Description: STG Manage Bowel with Medication with mod I  Assistance. 03/02/2023 0530 by Vella Raring, RN Outcome: Progressing 03/02/2023 0530 by Vella Raring, RN Outcome: Progressing   Problem: RH BLADDER ELIMINATION Goal: RH STG MANAGE BLADDER WITH ASSISTANCE Description: STG Manage Bladder With min Assistance 03/02/2023 0530 by Vella Raring, RN Outcome: Progressing 03/02/2023 0530 by Vella Raring, RN Outcome: Progressing Goal: RH STG MANAGE BLADDER WITH MEDICATION WITH ASSISTANCE Description: STG Manage Bladder With Medication With mod I Assistance. 03/02/2023 0530 by Vella Raring, RN Outcome: Progressing 03/02/2023 0530 by Vella Raring, RN Outcome: Progressing   Problem: RH SKIN INTEGRITY Goal: RH STG SKIN FREE OF INFECTION/BREAKDOWN Description: Manage skin w min assist 03/02/2023 0530 by Vella Raring, RN Outcome: Progressing 03/02/2023 0530 by Vella Raring, RN Outcome: Progressing Goal: RH STG MAINTAIN SKIN INTEGRITY WITH ASSISTANCE Description: STG Maintain Skin Integrity With min Assistance. 03/02/2023 0530 by Vella Raring, RN Outcome: Progressing 03/02/2023 0530 by Vella Raring, RN Outcome: Progressing   Problem: RH SAFETY Goal: RH STG ADHERE TO SAFETY PRECAUTIONS W/ASSISTANCE/DEVICE Description: STG Adhere to Safety Precautions With cues  Assistance/Device. 03/02/2023 0530 by Vella Raring, RN Outcome:  Progressing 03/02/2023 0530 by Vella Raring, RN Outcome: Progressing   Problem: RH COGNITION-NURSING Goal: RH STG USES MEMORY AIDS/STRATEGIES W/ASSIST TO PROBLEM SOLVE Description: STG Uses Memory Aids/Strategies With cues Assistance to Problem Solve. 03/02/2023 0530 by Vella Raring, RN Outcome: Progressing 03/02/2023 0530 by Vella Raring, RN Outcome: Progressing   Problem: RH KNOWLEDGE DEFICIT Goal: RH STG INCREASE KNOWLEDGE OF DIABETES Description: Patient and wife will be able to manage prediabetes using educational resources for medications and dietary modification independently 03/02/2023 0530 by Vella Raring, RN Outcome: Progressing 03/02/2023 0530 by Vella Raring, RN Outcome: Progressing Goal: RH STG INCREASE KNOWLEDGE OF HYPERTENSION Description: Patient and wife will be able to manage HTN using educational resources for medications and dietary modification independently 03/02/2023 0530 by Vella Raring, RN Outcome: Progressing 03/02/2023 0530 by Vella Raring, RN Outcome: Progressing Goal: RH STG INCREASE KNOWLEGDE OF HYPERLIPIDEMIA Description: Patient and wife will be able to manage HLD using educational resources for medications and dietary modification independently Outcome: Progressing Goal: 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

## 2023-03-02 NOTE — Plan of Care (Signed)
  Problem: Sit to Stand Goal: LTG:  Patient will perform sit to stand with assistance level (PT) Description: LTG:  Patient will perform sit to stand with assistance level (PT) Flowsheets (Taken 03/02/2023 1532) LTG: PT will perform sit to stand in preparation for functional mobility with assistance level: (upgraded 2/2 pt progressing quicker than anticipated) Contact Guard/Touching assist

## 2023-03-02 NOTE — Progress Notes (Signed)
 Speech Language Pathology Weekly Progress and Session Note  Patient Details  Name: AARIT KASHUBA MRN: [id number] Date of Birth: 11/01/1950  Beginning of progress report period: February 23, 2023 End of progress report period: March 02, 2023  Today's Date: 03/02/2023 SLP Individual Time: 1105-1200 SLP Individual Time Calculation (min): 55 min  Short Term Goals: Week 1: SLP Short Term Goal 1 (Week 1): Patient will solve basic environmental and functional problems with 80% accuracy given max assist. SLP Short Term Goal 1 - Progress (Week 1): Met SLP Short Term Goal 2 (Week 1): Patient will utilize external and internal memory aids to recall daily information with 75% accuracy given max assist. SLP Short Term Goal 2 - Progress (Week 1): Met SLP Short Term Goal 3 (Week 1): Patient will demonstrate awareness of deficits by stating 2 cognitive and 2 physical deficits when provided with max verbal cues. SLP Short Term Goal 3 - Progress (Week 1): Met  New Short Term Goals: Week 2: SLP Short Term Goal 1 (Week 2): Patient will solve basic environmental and functional problems with 80% accuracy given mod assist. SLP Short Term Goal 2 (Week 2): Patient will utilize external and internal memory aids to recall daily information with 75% accuracy given mod assist. SLP Short Term Goal 3 (Week 2): Patient will demonstrate awareness of deficits by stating 2 cognitive and 2 physical deficits when provided with mod verbal cues.  Weekly Progress Updates: Patient has made excellent progress towards therapy goals meeting 3/3 short term goals set for this reporting period. Patient currently solves basic environmental and function problems, utilizes external and internal memory aids to recall daily information with 75%, and demonstrates improving awareness of cognitive and physical deficits all with max assist. Patient and family education ongoing. Patient will continue to benefit from skilled therapy services  during remainder of CIR stay.    Intensity: Minumum of 1-2 x/day, 30 to 90 minutes Frequency: 3 to 5 out of 7 days Duration/Length of Stay: 17-21 days Treatment/Interventions: Cognitive remediation/compensation;Environmental controls;Cueing hierarchy;Functional tasks;Therapeutic Activities;Internal/external aids;Patient/family education  Daily Session  Skilled Therapeutic Interventions: SLP conducted skilled therapy session targeting cognitive retraining goals. Patient being visited by brother throughout visit and was agreeable to all speech therapy tasks. SLP began by reviewing schedule for the day and providing patient with visual schedule aid. Patient recalled broad events from therapy sessions with min assist but required up to max assist to provide more details. Patient then participated in card game requiring working memory and recollection of game rules, benefiting from mod to max assist to recall rules and execute accurately. SLP provided patient with memory log to keep track of events taking place throughout the day and encouraged consistent use. SLP then facilitated basic money math tasks with patient benefiting from max assist to achieve 100% accuracy. Patient was left in lowered bed with call bell in reach and bed alarm set. SLP will continue to target goals per plan of care.       Pain Pain Assessment Pain Scale: 0-10 Pain Score: 0-No pain  Therapy/Group: Individual Therapy  [name], M.A., CCC-SLP  [name] 03/02/2023, 12:18 PM

## 2023-03-02 NOTE — Progress Notes (Signed)
 LTM EEG disconnected - no skin breakdown at [facility].

## 2023-03-03 ENCOUNTER — Inpatient Hospital Stay (HOSPITAL_COMMUNITY): Payer: Medicare Other

## 2023-03-03 DIAGNOSIS — R071 Chest pain on breathing: Secondary | ICD-10-CM

## 2023-03-03 DIAGNOSIS — K5900 Constipation, unspecified: Secondary | ICD-10-CM | POA: Diagnosis not present

## 2023-03-03 DIAGNOSIS — R0602 Shortness of breath: Secondary | ICD-10-CM

## 2023-03-03 DIAGNOSIS — N179 Acute kidney failure, unspecified: Secondary | ICD-10-CM | POA: Diagnosis not present

## 2023-03-03 DIAGNOSIS — I10 Essential (primary) hypertension: Secondary | ICD-10-CM

## 2023-03-03 DIAGNOSIS — R072 Precordial pain: Secondary | ICD-10-CM

## 2023-03-03 DIAGNOSIS — I63531 Cerebral infarction due to unspecified occlusion or stenosis of right posterior cerebral artery: Secondary | ICD-10-CM | POA: Diagnosis not present

## 2023-03-03 DIAGNOSIS — I5032 Chronic diastolic (congestive) heart failure: Secondary | ICD-10-CM

## 2023-03-03 LAB — BRAIN NATRIURETIC PEPTIDE: B Natriuretic Peptide: 323.5 pg/mL — ABNORMAL HIGH (ref 0.0–100.0)

## 2023-03-03 LAB — D-DIMER, QUANTITATIVE: D-Dimer, Quant: >20 ug{FEU}/mL — ABNORMAL HIGH (ref 0.00–0.50)

## 2023-03-03 LAB — TROPONIN I (HIGH SENSITIVITY)
Troponin I (High Sensitivity): 53 ng/L — ABNORMAL HIGH (ref <18)
Troponin I (High Sensitivity): 41 ng/L — ABNORMAL HIGH (ref <18)

## 2023-03-03 LAB — SEDIMENTATION RATE: Sed Rate: 30 mm/h — ABNORMAL HIGH (ref 0–16)

## 2023-03-03 LAB — C-REACTIVE PROTEIN: CRP: 11.4 mg/dL — ABNORMAL HIGH (ref <1.0)

## 2023-03-03 NOTE — Consult Note (Addendum)
 Cardiology Consultation   Patient ID: EMIT KUENZEL MRN: [id number]; DOB: [birth year]  Admit date: 02/21/2023 Date of Consult: 03/03/2023  PCP:  Benita Stabile, MD   [facility] Providers Cardiologist:  Outpatient cardiology at [facility]   Chief complaint: Inpatient rehab. Reason of consult: Pleuritic chest pain and shortness of breath  Patient Profile:   Edward Rodgers is a 73 y.o. male with a hx of chronic systolic heart failure with improved function, HTN, hypothyroidism, gout, cerebellar stroke, recent right PCA stroke discharged to CIR who is being seen 03/03/2023 for the evaluation of pleuritic chest pain and SOB at the request of Dr. Natale Lay.  History of Present Illness:   Edward Rodgers has a history of cerebellar stroke and recent right PCA stroke now discharged to CIR. Cardiology was consulted 02/20/23 for evaluation for ILR as stroke felt related to cardioembolic source. Echocardiogram this admission negative for LV thrombus, LVEF 55%, grade 1 DD, and no significant valvular disease.   With discussion, he reports a history of systolic heart failure in 1996 - he confirms he was a heavy alcohol drinker at that time.  He has been sober since about 2001.  He reports he was suppose to have a heart catheterization at [facility] but it did not take place for reason unknown. It was recommended about 1 to 2 years.  He denies history of PCI.  He also denies history of MI.    He has been participating in therapy services, but complained of worsening pleuritic chest pain and SOB this morning prompting cardiology consult. CXR with increased bibasilar atelectasis.   HS troponin 53 --> 41 sCr 1.25, K 4.2 EKG was sinus rhythm, T wave inversions inferior leads and lateral leads, LVH  During my interview, he describes sharp brief chest pain when he coughs.  He also describes chest discomfort when he takes a deep breath.  He states that both of these chest discomfort sensations are not new  and he relates similar sensations to when he has gas relieved with Rolaids.    Past Medical History:  Diagnosis Date   Cancer [facility]) 2002   prostate   CHF (congestive heart failure) (HCC)    Gout    Hypertension    Thyroid disease     Past Surgical History:  Procedure Laterality Date   COLONOSCOPY N/A 07/15/2017   Procedure: COLONOSCOPY;  Surgeon: Malissa Hippo, MD;  Location: [facility] ENDO SUITE;  Service: Endoscopy;  Laterality: N/A;  830   IR CT HEAD LTD  02/14/2023   IR PERCUTANEOUS ART THROMBECTOMY/INFUSION INTRACRANIAL INC DIAG ANGIO  02/14/2023   LOOP RECORDER INSERTION N/A 02/20/2023   Procedure: LOOP RECORDER INSERTION;  Surgeon: Duke Salvia, MD;  Location: [facility] INVASIVE CV LAB;  Service: Cardiovascular;  Laterality: N/A;   POLYPECTOMY  07/15/2017   Procedure: POLYPECTOMY;  Surgeon: Malissa Hippo, MD;  Location: [facility] ENDO SUITE;  Service: Endoscopy;;  colon   PROSTATE SURGERY     RADIOLOGY WITH ANESTHESIA Left 02/14/2023   Procedure: IR WITH ANESTHESIA;  Surgeon: Radiologist, Medication, MD;  Location: [facility] OR;  Service: Radiology;  Laterality: Left;   RADIOLOGY WITH ANESTHESIA N/A 02/14/2023   Procedure: IR WITH ANESTHESIA;  Surgeon: Radiologist, Medication, MD;  Location: [facility] OR;  Service: Radiology;  Laterality: N/A;     Home Medications:  Prior to Admission medications   Medication Sig Start Date End Date Taking? Authorizing Provider  alendronate (FOSAMAX) 70 MG tablet Take 70 mg by mouth once  a week. Take with a full glass of water on an empty stomach. mondays    [provider]  allopurinol (ZYLOPRIM) 300 MG tablet Take 300 mg by mouth every morning.    [provider]  amLODipine (NORVASC) 10 MG tablet Take 1 tablet (10 mg total) by mouth daily. 02/22/23   Kara Mead, NP  aspirin EC 81 MG tablet Take 1 tablet (81 mg total) by mouth daily. Swallow whole. 02/22/23   Kara Mead, NP  atorvastatin (LIPITOR) 80 MG tablet Take 80 mg by mouth daily.    [provider]  calcium-vitamin D (OSCAL WITH D) 500-5 MG-MCG tablet Take 1 tablet by mouth daily. 02/22/23   Kara Mead, NP  clopidogrel (PLAVIX) 75 MG tablet Take 1 tablet (75 mg total) by mouth daily. 02/22/23   Kara Mead, NP  empagliflozin (JARDIANCE) 25 MG TABS tablet Take 12.5 mg by mouth daily.    [provider]  feeding supplement (ENSURE ENLIVE / ENSURE PLUS) LIQD Take 237 mLs by mouth 2 (two) times daily between meals. 02/21/23   Kara Mead, NP  latanoprost (XALATAN) 0.005 % ophthalmic solution Place 1 drop into the right eye at bedtime. 02/21/23   Kara Mead, NP  levothyroxine (SYNTHROID) 88 MCG tablet Take 1 tablet (88 mcg total) by mouth daily before breakfast. 02/22/23   Kara Mead, NP  metoprolol succinate (TOPROL-XL) 100 MG 24 hr tablet Take 1 tablet (100 mg total) by mouth daily. Take with or immediately following a meal. 02/22/23   Kara Mead, NP  spironolactone (ALDACTONE) 25 MG tablet Take 1 tablet (25 mg total) by mouth daily. 02/22/23   Kara Mead, NP  venlafaxine XR (EFFEXOR-XR) 150 MG 24 hr capsule Take 150 mg by mouth every evening.    [provider]    Inpatient Medications: Scheduled Meds:  allopurinol  300 mg Oral q morning   amLODipine  10 mg Oral Daily   aspirin EC  81 mg Oral Daily   atorvastatin  80 mg Oral Daily   calcium-vitamin D  1 tablet Oral Daily   clopidogrel  75 mg Oral Daily   feeding supplement  237 mL Oral BID BM   fluticasone  1 spray Each Nare Daily   heparin  5,000 Units Subcutaneous Q8H   latanoprost  1 drop Right Eye QHS   levothyroxine  88 mcg Oral QAC breakfast   lidocaine  1 patch Transdermal Q24H   melatonin  3 mg Oral QHS   metoprolol succinate  100 mg Oral Daily   mirabegron ER  50 mg Oral Daily   polyethylene glycol  17 g Oral Daily   QUEtiapine  75 mg Oral QHS   senna-docusate  1 tablet Oral QHS   venlafaxine XR  150 mg Oral QPM   Continuous Infusions:  PRN  Meds: acetaminophen **OR** acetaminophen (TYLENOL) oral liquid 160 mg/5 mL **OR** acetaminophen, ondansetron, QUEtiapine, sodium chloride  Allergies:   No Known Allergies  Social History:   Social History   Socioeconomic History   Marital status: Married    Spouse name: Not on file   Number of children: Not on file   Years of education: Not on file   Highest education level: Not on file  Occupational History   Not on file  Tobacco Use   Smoking status: Former   Smokeless tobacco: Never  Vaping Use   Vaping status: Never Used  Substance and Sexual Activity  Alcohol use: No   Drug use: No   Sexual activity: Not on file  Other Topics Concern   Not on file  Social History Narrative   Not on file   Social Drivers of Health   Financial Resource Strain: Not on file  Food Insecurity: Patient Declined (02/15/2023)   Hunger Vital Sign    Worried About Running Out of Food in the Last Year: Patient declined    Ran Out of Food in the Last Year: Patient declined  Transportation Needs: Patient Unable To Answer (02/15/2023)   PRAPARE - Administrator, Civil Service (Medical): Patient unable to answer    Lack of Transportation (Non-Medical): Patient unable to answer  Physical Activity: Not on file  Stress: Not on file  Social Connections: Patient Unable To Answer (02/15/2023)   Social Connection and Isolation Panel [NHANES]    Frequency of Communication with Friends and Family: Patient unable to answer    Frequency of Social Gatherings with Friends and Family: Patient unable to answer    Attends Religious Services: Patient unable to answer    Active Member of Clubs or Organizations: Patient unable to answer    Attends Banker Meetings: Patient unable to answer    Marital Status: Patient unable to answer  Intimate Partner Violence: Patient Unable To Answer (02/15/2023)   Humiliation, Afraid, Rape, and Kick questionnaire    Fear of Current or Ex-Partner: Patient  unable to answer    Emotionally Abused: Patient unable to answer    Physically Abused: Patient unable to answer    Sexually Abused: Patient unable to answer    Family History:   History reviewed. No pertinent family history.   ROS:  Please see the history of present illness.   All other ROS reviewed and negative.     Physical Exam/Data:   Vitals:   03/03/23 0825 03/03/23 1200 03/03/23 1254 03/03/23 1300  BP: (!) 129/98 125/75    Pulse: 79 89  99  Resp: 19 17  16   Temp: 98.1 F (36.7 C) 98.2 F (36.8 C)  98.2 F (36.8 C)  TempSrc: Oral Oral  Oral  SpO2: 98% (!) 89% 94%   Weight:      Height:        Intake/Output Summary (Last 24 hours) at 03/03/2023 1853 Last data filed at 03/03/2023 1338 Gross per 24 hour  Intake 50 ml  Output 400 ml  Net -350 ml      03/03/2023    5:00 AM 03/02/2023    6:07 AM 03/01/2023    4:37 AM  Last 3 Weights  Weight (lbs) 195 lb 15.8 oz 195 lb 8.8 oz 195 lb 15.8 oz  Weight (kg) 88.9 kg 88.7 kg 88.9 kg     Body mass index is 28.94 kg/m.  General:  Well nourished, well developed, in no acute distress - left sided weakness HEENT: normal Neck: no JVD Vascular: No carotid bruits; Distal pulses 2+ bilaterally Cardiac:  normal S1, S2; RRR; no murmur  Lungs:  clear to auscultation bilaterally, no wheezing, rhonchi or rales  Abd: soft, nontender, no hepatomegaly  Ext: no edema Musculoskeletal:  left sided weakness Skin: warm and dry  Neuro:  CNs 2-12 intact, no focal abnormalities noted Psych:  Normal affect   EKG:  The EKG was personally reviewed and demonstrates:  sinus rhythm with HR 88, TWI inferior and lateral leads Telemetry:  Telemetry was personally reviewed and demonstrates:  N/A  Relevant CV Studies:  Echo 02/15/23:  1. No evidence of LV thrombus. Definity is administered. Left ventricular  ejection fraction, by estimation, is 55%. The left ventricle has normal  function. The left ventricle has no regional wall motion  abnormalities.  Left ventricular diastolic  parameters are consistent with Grade I diastolic dysfunction (impaired  relaxation).   2. Right ventricular systolic function was not well visualized. The right  ventricular size is not well visualized. Tricuspid regurgitation signal is  inadequate for assessing PA pressure.   3. The mitral valve is normal in structure. No evidence of mitral valve  regurgitation. No evidence of mitral stenosis.   4. The aortic valve was not well visualized. Aortic valve regurgitation  is not visualized. No aortic stenosis is present.   5. Aortic dilatation noted. There is borderline dilatation of the aortic  root, measuring 36 mm. There is mild dilatation of the ascending aorta,  measuring 40 mm.   Laboratory Data:  High Sensitivity Troponin:   Recent Labs  Lab 03/03/23 1029 03/03/23 1200  TROPONINIHS 53* 41*     Chemistry Recent Labs  Lab 02/25/23 0514 02/26/23 1139 03/02/23 0545  NA 133* 137 137  K 3.8 3.9 4.2  CL 101 104 103  CO2 18* 24 22  GLUCOSE 89 96 101*  BUN 36* 22 17  CREATININE 1.64* 1.20 1.25*  CALCIUM 9.1 10.1 9.6  GFRNONAA 44* >60 >60  ANIONGAP 14 9 12     No results for input(s): "PROT", "ALBUMIN", "AST", "ALT", "ALKPHOS", "BILITOT" in the last 168 hours. Lipids No results for input(s): "CHOL", "TRIG", "HDL", "LABVLDL", "LDLCALC", "CHOLHDL" in the last 168 hours.  Hematology Recent Labs  Lab 03/02/23 0545  WBC 6.0  RBC 4.19*  HGB 11.7*  HCT 36.7*  MCV 87.6  MCH 27.9  MCHC 31.9  RDW 14.4  PLT 194   Thyroid No results for input(s): "TSH", "FREET4" in the last 168 hours.  BNPNo results for input(s): "BNP", "PROBNP" in the last 168 hours.  DDimer No results for input(s): "DDIMER" in the last 168 hours.   Radiology/Studies:  DG Chest 2 View Result Date: 03/03/2023 CLINICAL DATA:  Shortness of breath. EXAM: CHEST - 2 VIEW COMPARISON:  Radiographs 06/08/2008 and 01/05/2004. FINDINGS: Lower lung volumes with mildly  increased bibasilar atelectasis. No confluent airspace disease, pleural effusion or pneumothorax. The heart size and mediastinal contours are stable with mild aortic tortuosity. Loop recorder noted in the left anterior chest wall. There are stable degenerative changes in the spine without evidence of acute osseous abnormality. IMPRESSION: Lower lung volumes with mildly increased bibasilar atelectasis. No other evidence of acute cardiopulmonary process. Electronically Signed   By: Carey Bullocks M.D.   On: 03/03/2023 15:01   Overnight EEG with video Result Date: 03/02/2023 Charlsie Quest, MD     03/02/2023 11:48 AM Patient Name: Edward Rodgers MRN: [id number] Epilepsy Attending: Charlsie Quest Referring Physician/Provider: Erick Blinks, MD Duration: 03/01/2023 1149 to 01/30/2023 1007 Patient history: 72yo make with staring spells. EEG to evaluate for seizure Level of alertness: Awake, asleep AEDs during EEG study: None Technical aspects: This EEG study was done with scalp electrodes positioned according to the 10-20 International system of electrode placement. Electrical activity was reviewed with band pass filter of 1-70Hz , sensitivity of 7 uV/mm, display speed of 11mm/sec with a 60Hz  notched filter applied as appropriate. EEG data were recorded continuously and digitally stored.  Video monitoring was available and reviewed as appropriate. Description: The posterior dominant rhythm consists of 8.5 Hz  activity of moderate voltage (25-35 uV) seen predominantly in posterior head regions, symmetric and reactive to eye opening and eye closing. Sleep was characterized by vertex waves, sleep spindles (12 to 14 Hz), maximal frontocentral region. Hyperventilation and photic stimulation were not performed.   IMPRESSION: This study is within normal limits. No seizures or epileptiform discharges were seen throughout the recording. A normal interictal EEG does not exclude the diagnosis of epilepsy. Priyanka Annabelle Harman    CT HEAD WO CONTRAST ( ) Result Date: 03/01/2023 CLINICAL DATA:  Initial evaluation for acute confusion, vision change. EXAM: CT HEAD WITHOUT CONTRAST TECHNIQUE: Contiguous axial images were obtained from the base of the skull through the vertex without intravenous contrast. RADIATION DOSE REDUCTION: This exam was performed according to the departmental dose-optimization program which includes automated exposure control, adjustment of the mA and/or kV according to patient size and/or use of iterative reconstruction technique. COMPARISON:  Comparison made with prior CT from 02/16/2023 and MRI from 02/15/2023 FINDINGS: Brain: Previously identified evolving subacute right PCA distribution infarct again seen, overall relatively stable in size and distribution as compared to previous MRI. No evidence for hemorrhagic transformation. No significant regional mass effect. No other new acute cortically based infarct. No acute intracranial hemorrhage. Left parafalcine calcified meningioma overlying the anterior left frontal convexity again noted, stable. No hydrocephalus or extra-axial fluid collection. Vascular: No abnormal hyperdense vessel. Scattered vascular calcifications noted within the carotid siphons. Skull: Scalp soft tissues demonstrate no acute finding. Calvarium intact. Sinuses/Orbits: Globes and orbital soft tissues demonstrate no acute finding. Visualized paranasal sinuses and mastoid air cells remain largely clear. Other: None. IMPRESSION: 1. Evolving subacute right PCA distribution infarct, overall relatively stable in size and distribution as compared to previous MRI. No evidence for hemorrhagic transformation or significant regional mass effect. 2. No other new acute intracranial abnormality. 3. Stable left parafalcine calcified meningioma overlying the anterior left frontal convexity. Electronically Signed   By: Rise Mu M.D.   On: 03/01/2023 19:08     Assessment and Plan:   Chest  pain - seems pleuritic in nature - sharp pain with cough-he states this is not new -He also reports chest discomfort with deep inspiration when working with therapy this morning - He states that neither of these sensations are new and may be related to gas, CP has been relieved in the past with rolaids and belching - He has not had regular bowel movements, but is passing gas - Reassuring echocardiogram on 02/15/2023 - Given pleuritic nature of his chest pain, consider repeating a limited echo to evaluate LVEF and pericardial effusion -- CE were low and flat at 53 --> 41 - Will add CRP and sed rate -- may need to consider an ischemic evaluation once recovered from his stroke   Chronic systolic heart failure - EF improved, I am unclear on the timeline - He does not appear volume up on exam, CXR not consistent with CHF - Question nonischemic cardiomyopathy in the setting of heavy alcohol drinking, he has been sober since 2001 - nuclear stress test 2007 - Echocardiogram reassuring this admission - Will repeat a limited echo as above   Acute CVA - Resulting in left-sided weakness - Discharge to CIR - Continue aspirin and Plavix, per neurology -- ILR in place  Risk Assessment/Risk Scores:     For questions or updates, please contact [facility] Please consult [email or web address] for contact info under    Signed, Marcelino Duster, [place]  03/03/2023 4:51 PM  ADDENDUM:  Patient seen and examined.  I personally taken a history, examined the patient, reviewed relevant notes,  laboratory data / imaging studies.  I performed a substantive portion of this encounter and formulated the important aspects of the plan.  I agree with the APP's note, impression, and recommendations; however, I have edited the note to reflect changes or salient points.   Patient is accompanied by his brother at bedside and provides verbal consent with regards to discussing his medical information in his  presence. Patient was admitted earlier in February 2025 due to strokelike symptoms and according to the discharge summary was diagnosed with right PCA scattered infarct with thalamic hemorrhagic transformation and punctate left PCA infarct with right P2 occlusion status post TNK and IR interventions with TICI 3 repeat perfusion.  Patient also underwent loop recorder implanted during that hospitalization and was transferred to inpatient rehab.  Cardiology was consulted today pleuritic chest pain and shortness of breath.  Patient states that this morning he was having chest pain which he points to the epigastric region and did not want to participate in rehab activities.  The pain is more pronounced with deep inspiration.  Not brought on by effort related activities, and does not resolve with rest.  Symptoms are self-limited.  He also was noted to be hypoxic and was started on nasal cannula oxygen.  Currently he is chest pain-free.  The discomfort is not positional.  No prior history of DVT or PE.  No family history of hypercoagulable state, per patient.  Review of Systems  HENT:         Hard of hearing  Cardiovascular:  Positive for chest pain. Negative for claudication, irregular heartbeat, leg swelling, near-syncope, orthopnea, palpitations, paroxysmal nocturnal dyspnea and syncope.  Respiratory:  Positive for shortness of breath.   Hematologic/Lymphatic: Negative for bleeding problem.  Neurological:        Left-sided weakness     PHYSICAL EXAM: Today's Vitals   03/03/23 0953 03/03/23 1200 03/03/23 1254 03/03/23 1300  BP:  125/75    Pulse:  89  99  Resp:  17  16  Temp:  98.2 F (36.8 C)  98.2 F (36.8 C)  TempSrc:  Oral  Oral  SpO2:  (!) 89% 94%   Weight:      Height:      PainSc: 6       Body mass index is 28.94 kg/m.   Net IO Since Admission: 1,319.7 mL [03/03/23 1853]  Filed Weights   03/01/23 0437 03/02/23 0607 03/03/23 0500  Weight: 88.9 kg 88.7 kg 88.9 kg    Physical  Exam  Constitutional: No distress. He appears chronically ill.  hemodynamically stable  Neck: No JVD present.  Cardiovascular: Normal rate, regular rhythm, S1 normal and S2 normal. Exam reveals no gallop, no S3 and no S4.  No murmur heard. Pulmonary/Chest: Effort normal and breath sounds normal. No stridor. He has no wheezes. He has no rales. He exhibits no tenderness.  Abdominal: Bowel sounds are normal.  Wears an adult diaper  Musculoskeletal:        General: No edema.     Cervical back: Neck supple.  Neurological:  Left-sided weakness.  Able to lift left upper and lower extremities against gravity  Skin: Skin is warm.    EKG: (personally reviewed by me) 03/03/2023: Sinus rhythm, 88 bpm, LVH per voltage criteria, ST-T changes in inferior lateral leads likely repolarization abnormality but ischemia cannot be ruled out, prolonged QT.  Similar ST-T changes on  prior EKG dating 02/14/2023  Telemetry: (personally reviewed by me) Not on telemetry, currently in inpatient rehab   Impression:  Precordial pain Shortness of breath History of heart failure with improved EF, stage C, NYHA class II History of alcohol abuse Status post right PCA scattered infarct with thalamic hemorrhagic transformation and punctate left PCA infarct with right P2 occlusion status post TNK and IR interventions with TICI 3 repeat perfusion.   Recommendations:  Precordial pain: Predominantly noncardiac based on symptoms. EKG shows sinus rhythm with LVH per voltage criteria and ST-T changes likely secondary repolarization abnormality but ischemia cannot be entirely ruled out High sensitive troponins above normal limits but essentially flat not suggestive of ACS. Repeat limited echocardiogram as discussed above to reevaluate LVEF, regional wall motion, pericardial effusion, and LV thrombus Check inflammatory markers as discussed above Other noncardiac causes could be dyspepsia, prolonged constipation (patient states  he has not had a bowel movement in approximately 2 weeks)-will defer management to primary team. As long as he is not having symptoms / findings to suggest ACS would like to recommend medical therapy as he recently had a stroke earlier this month with findings concerning for hemorrhagic transformation.  He has underwent IR procedure as well as TNK.  However, approximately 4 weeks after his recent stroke ischemic workup should be considered as he has risk factors for coronary artery disease.  I have asked him to follow-up with his outpatient cardiologist at the [facility] to reestablish after this hospitalization and to discuss ischemic workup.  Both patient and brother verbalized understanding Continue antiplatelet therapy-aspirin 81 mg p.o. daily, Plavix 75 mg p.o. daily Continue Lipitor 80 mg p.o. daily  Shortness of breath: Requiring nasal cannula oxygen. This is a new finding. Will check a D-dimer -if above normal limits will ideally need a CT PE study to rule out pulmonary embolism. Workup for noncardiac causes per primary team. Will interrogate his recent implantable loop recorder -to rule out A-fib given his dyspnea. Check BNP  Heart failure with improved EF: Patient states that he had reduced LVEF back in 1996 when he used to consume a significant amount of alcohol. LVEF as per the most recent echocardiogram has improved. Continue Toprol-XL 100 mg p.o. daily. Continue amlodipine 10 mg p.o. daily. SBP ranges between 130-146 mmHg. Will uptitrate GDMT as hemodynamics and laboratory values allow.  Remainder of his workup/management of his chronic and active problem list per primary team.  Further recommendations to follow as the case evolves.   This note was created using a voice recognition software as a result there may be grammatical errors inadvertently enclosed that do not reflect the nature of this encounter. Every attempt is made to correct such errors.   Tessa Lerner, DO, [facility]  [address] [place], [place] [address] Pager: [phone] Office: [phone] 03/03/2023 6:53 PM

## 2023-03-03 NOTE — Progress Notes (Signed)
 Speech Language Pathology Daily Session Note  Patient Details  Name: Edward Rodgers MRN: [id number] Date of Birth: [birth year]  Today's Date: 03/03/2023 SLP Individual Time: 0807-0901 SLP Individual Time Calculation (min): 54 min  Short Term Goals: Week 2: SLP Short Term Goal 1 (Week 2): Patient will solve basic environmental and functional problems with 80% accuracy given mod assist. SLP Short Term Goal 2 (Week 2): Patient will utilize external and internal memory aids to recall daily information with 75% accuracy given mod assist. SLP Short Term Goal 3 (Week 2): Patient will demonstrate awareness of deficits by stating 2 cognitive and 2 physical deficits when provided with mod verbal cues.  Skilled Therapeutic Interventions: SLP conducted skilled therapy session targeting cognitive retraining goals. Upon SLP entry, patient reporting chest pain. Alerted nursing staff and NT took vitals, then nurse entered to assess and alerted provider to concerns. Continued with session. Patient recalled events from previous afternoon given mod assist. SLP facilitated schedule and calendar management task. Patient benefited from min assist to complete basic calendar management problem solving tasks. SLP then facilitated set-up for memory book. Patient oriented to date with mod assist using calendar and then sustained attention to memory log task for 15 minutes given min to mod assist. Patient was left in lowered bed with call bell in reach and bed alarm set. SLP will continue to target goals per plan of care.      Pain Pain Assessment Pain Scale: 0-10 Pain Score: 6  Pain Location: Chest Pain Intervention(s): RN made aware;MD notified (Comment) Therapy/Group: Individual Therapy  [name], M.A., CCC-SLP  [name] 03/03/2023, 9:54 AM

## 2023-03-03 NOTE — Progress Notes (Signed)
 Physical Therapy Session Note  Patient Details  Name: Edward Rodgers MRN: [id number] Date of Birth: 10/12/1950  Today's Date: 03/03/2023 PT Individual Time: 1300-1315 PT Individual Time Calculation (min): 15 min  and Today's Date: 03/03/2023 PT Missed Time: 60 Minutes Missed Time Reason: Patient fatigue;Patient ill (Comment) (SOB, desatting)  Short Term Goals: Week 2:  PT Short Term Goal 1 (Week 2): pt will perform bed mobility with CGA or less PT Short Term Goal 2 (Week 2): pt will ambulate 100 feet with RW and mod A or less PT Short Term Goal 3 (Week 2): pt will perform car transfer with mod A or less with LRAD  Skilled Therapeutic Interventions/Progress Updates:      Pt supine in bed upon arrival. Pt reports "do we have to do therapy today". Pt on 2L O2, and reports increased weakness. Therapist noted PA note for EKG, however results still pending. Therapist consulted PA and nurse in person. PA reports okay to trial therapy.   Vitals: while resting 93 on 2L O2, removed 2L between 92 and 94. 2L O2 pt attemtping to sit EOB with heavy mod-max A, pt very SOB and desatting to 86 on 2L O2. Pt requesting to try again tomorrow.   Notified nurse and PA of increased level of assist needed today, SOB, and desatting.   Pt supine in bed at end of session with all needs within reach and bed alarm on.   Pt missed 60 minutes 2/2 not feeling well.   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 Restrictions Weight Bearing Restrictions Per Provider Order: No  Therapy/Group: Individual Therapy  [facility] Ambrose [place], [place], DPT  03/03/2023, 12:54 PM

## 2023-03-03 NOTE — Progress Notes (Signed)
 PROGRESS NOTE   Subjective/Complaints:  This morning Edward Rodgers reports chest pain that began when he woke up.  Chest pain is worse with inspiration.  ROS: as per HPI. Denies fever, HA, shortness of breath abd pain, N/V/D/C, or any other complaints at this time.   +CP   Objective:   Overnight EEG with video Result Date: 03/02/2023 Edward Quest, MD     03/02/2023 11:48 AM Patient Name: Edward Rodgers MRN: [id number] Epilepsy Attending: Charlsie Rodgers Referring Physician/Provider: Erick Blinks, MD Duration: 03/01/2023 1149 to 01/30/2023 1007 Patient history: 72yo make with staring spells. EEG to evaluate for seizure Level of alertness: Awake, asleep AEDs during EEG study: None Technical aspects: This EEG study was done with scalp electrodes positioned according to the 10-20 International system of electrode placement. Electrical activity was reviewed with band pass filter of 1-70Hz , sensitivity of 7 uV/mm, display speed of 63mm/sec with a 60Hz  notched filter applied as appropriate. EEG data were recorded continuously and digitally stored.  Video monitoring was available and reviewed as appropriate. Description: The posterior dominant rhythm consists of 8.5 Hz activity of moderate voltage (25-35 uV) seen predominantly in posterior head regions, symmetric and reactive to eye opening and eye closing. Sleep was characterized by vertex waves, sleep spindles (12 to 14 Hz), maximal frontocentral region. Hyperventilation and photic stimulation were not performed.   IMPRESSION: This study is within normal limits. No seizures or epileptiform discharges were seen throughout the recording. A normal interictal EEG does not exclude the diagnosis of epilepsy. Priyanka Annabelle Harman   CT HEAD WO CONTRAST ( ) Result Date: 03/01/2023 CLINICAL DATA:  Initial evaluation for acute confusion, vision change. EXAM: CT HEAD WITHOUT CONTRAST TECHNIQUE: Contiguous  axial images were obtained from the base of the skull through the vertex without intravenous contrast. RADIATION DOSE REDUCTION: This exam was performed according to the departmental dose-optimization program which includes automated exposure control, adjustment of the mA and/or kV according to patient size and/or use of iterative reconstruction technique. COMPARISON:  Comparison made with prior CT from 02/16/2023 and MRI from 02/15/2023 FINDINGS: Brain: Previously identified evolving subacute right PCA distribution infarct again seen, overall relatively stable in size and distribution as compared to previous MRI. No evidence for hemorrhagic transformation. No significant regional mass effect. No other new acute cortically based infarct. No acute intracranial hemorrhage. Left parafalcine calcified meningioma overlying the anterior left frontal convexity again noted, stable. No hydrocephalus or extra-axial fluid collection. Vascular: No abnormal hyperdense vessel. Scattered vascular calcifications noted within the carotid siphons. Skull: Scalp soft tissues demonstrate no acute finding. Calvarium intact. Sinuses/Orbits: Globes and orbital soft tissues demonstrate no acute finding. Visualized paranasal sinuses and mastoid air cells remain largely clear. Other: None. IMPRESSION: 1. Evolving subacute right PCA distribution infarct, overall relatively stable in size and distribution as compared to previous MRI. No evidence for hemorrhagic transformation or significant regional mass effect. 2. No other new acute intracranial abnormality. 3. Stable left parafalcine calcified meningioma overlying the anterior left frontal convexity. Electronically Signed   By: Rise Mu M.D.   On: 03/01/2023 19:08   Recent Labs    03/02/23 0545  WBC 6.0  HGB 11.7*  HCT 36.7*  PLT 194    Recent Labs    03/02/23 0545  NA 137  K 4.2  CL 103  CO2 22  GLUCOSE 101*  BUN 17  CREATININE 1.25*  CALCIUM 9.6         Intake/Output Summary (Last 24 hours) at 03/03/2023 1001 Last data filed at 03/03/2023 0908 Gross per 24 hour  Intake 120 ml  Output 200 ml  Net -80 ml         Physical Exam: Vital Signs Blood pressure (!) 129/98, pulse 79, temperature 98.1 F (36.7 C), temperature source Oral, resp. rate 19, height 5\' 9"  (1.753 m), weight 88.9 kg, SpO2 98%.  Constitutional: NAD, Laying in bed HENT:     Head: Normocephalic and atraumatic.     Nose: Nose normal.     Mouth/Throat:     Mouth: Mucous membranes are moist.   Eyes:     Extraocular Movements: Extraocular movements intact.     Conjunctiva/sclera: Conjunctivae normal.     Pupils: Pupils are equal, round, and reactive to light.  Cardiovascular:     Rate and Rhythm: Normal rate and regular rhythm.     Heart sounds: Murmur heard.  Pulmonary:     Effort: Pulmonary effort is normal. No respiratory distress.     Breath sounds: No stridor. No wheezing or rhonchi.  Abdominal:     General: Bowel sounds are normal. There is no distension.     Palpations: Abdomen is soft.     Tenderness: There is no abdominal tenderness.  Musculoskeletal:        General: Swelling (trace LUE) present. Improving    Cervical back: Normal range of motion.      No pedal edema appreciated  No significant chest wall soreness noted Skin:    General: Skin is warm and dry.     Findings: No bruising or erythema.  Psychiatric:     Comments: Pt pleasant and cooperative  Neuro: alert, slightly dysarthric which is baseline, occasional staring off but no confusion and no postictal period.   PRIOR EXAMS: Neurological:     Mental Status: He is alert.     Comments: Alert and oriented x 4.  Slightly dysarthric speech.  Fair insight and awareness.  Language overall appears to be intact.  Left central 7.  Decreased sensation to light touch in left upper extremity and left lower extremity.  Moving right upper extremity and right lower extremity to gravity. Mildly  increased tone R hand.    Assessment/Plan: 1. Functional deficits which require 3+ hours per day of interdisciplinary therapy in a comprehensive inpatient rehab setting. Physiatrist is providing close team supervision and 24 hour management of active medical problems listed below. Physiatrist and rehab team continue to assess barriers to discharge/monitor patient progress toward functional and medical goals  Care Tool:  Bathing    Body parts bathed by patient: Left arm, Chest, Abdomen, Front perineal area, Right upper leg, Left upper leg, Right lower leg, Face   Body parts bathed by helper: Right arm, Buttocks, Left lower leg     Bathing assist Assist Level: Moderate Assistance - Patient 50 - 74%     Upper Body Dressing/Undressing Upper body dressing        Upper body assist Assist Level: Moderate Assistance - Patient 50 - 74%    Lower Body Dressing/Undressing Lower body dressing            Lower body assist Assist for lower body dressing: Maximal Assistance -  Patient 25 - 49%     Toileting Toileting    Toileting assist Assist for toileting: Maximal Assistance - Patient 25 - 49%     Transfers Chair/bed transfer  Transfers assist     Chair/bed transfer assist level: Moderate Assistance - Patient 50 - 74% (squat pivot/stand pivot)     Locomotion Ambulation   Ambulation assist   Ambulation activity did not occur: Safety/medical concerns  Assist level: 2 helpers (+2 for WC follow) Assistive device: Walker-rolling Max distance: 50 feet   Walk 10 feet activity   Assist  Walk 10 feet activity did not occur: Safety/medical concerns  Assist level: 2 helpers Assistive device: Walker-rolling   Walk 50 feet activity   Assist Walk 50 feet with 2 turns activity did not occur: Safety/medical concerns  Assist level: 2 helpers Assistive device: Walker-rolling    Walk 150 feet activity   Assist Walk 150 feet activity did not occur: Safety/medical  concerns         Walk 10 feet on uneven surface  activity   Assist Walk 10 feet on uneven surfaces activity did not occur: Safety/medical concerns         Wheelchair     Assist Is the patient using a wheelchair?: Yes Type of Wheelchair: Manual    Wheelchair assist level: Minimal Assistance - Patient > 75% Max wheelchair distance: 50    Wheelchair 50 feet with 2 turns activity    Assist        Assist Level: Minimal Assistance - Patient > 75%   Wheelchair 150 feet activity     Assist      Assist Level: Maximal Assistance - Patient 25 - 49%   Blood pressure (!) 129/98, pulse 79, temperature 98.1 F (36.7 C), temperature source Oral, resp. rate 19, height 5\' 9"  (1.753 m), weight 88.9 kg, SpO2 98%.  Medical Problem List and Plan: 1. 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  Await plan for loop recorder             -patient may shower -ELOS/Goals: 21-25 days, min assist with PT, OT and supervision with SLP -Continue CIR -Expected discharge 03/14/2023  2.  Antithrombotics: -DVT/anticoagulation:  Pharmaceutical: Heparin 5000U q8h -antiplatelet therapy: Aspirin 81 mg daily and Plavix 75 mg day x 3 weeks then Plavix alone 3. Pain Management: Lidoderm patch as directed, Tylenol as needed 4. Mood/Sleep/sundowning: Effexor 150 mg every evening, provide emotional support             -antipsychotic agents: Seroquel 50 mg nighty at 2030, have back up dose             -keep sleep chart 5. Neuropsych/cognition: This patient is capable of making decisions on his own behalf. 6. Skin/Wound Care: Routine skin checks 7. Fluids/Electrolytes/Nutrition: Routine in and outs with follow-up chemistries, continue vitamins/supplements 8.  Hypothyroidism.  Synthroid 70mcg/day 9.  Hypertension. Norvasc 10 mg daily, Toprol-Xl 100 mg daily, Aldactone 25 mg daily.             -bp trending up. Likely  will need additions to regimen  -02/22/23 BPs 140s/90s, not terrible, monitor trend with increase mobility  2/10 BP controlled, decrease spironolactone due to elevated CR/BUN -02/28/23 BP stable but creeping up, continue to monitor for now but may need to increase meds back -2/18 BP fairly controlled, continue current regimen  Vitals:   02/27/23 1626 02/27/23 2010 02/28/23 0516 02/28/23 1418  BP: (!) 134/102 (!) 141/88 (!) 140/78 (!) 148/80   02/28/23 1939 03/01/23 0437 03/01/23 1248 03/01/23 1929  BP: (!) 158/97 134/84 133/82 (!) 153/88   03/02/23 0446 03/02/23 2004 03/03/23 0352 03/03/23 0825  BP: (!) 142/92 (!) 146/92 130/84 (!) 129/98    10.H/O prostate cancer. Myrbetriq 50 mg daily. 11.Hyperlipidemia. Lipitor 80mg  daily 12.Constipation.             -miralax daily, SenokotS 1 tab daily -02/22/23 LBM 2days ago, monitor today, if no BM tomorrow then may want to increase regimen -2/18 LBM yesterday, continue to monitor 13.H/O gout. Zyloprim 300mg  daily 14.  AKI versus CKD  -Encourage oral fluid intake, decrease spironolactone to 12.5 mg  -2/11 recheck BMP today  Addendum- increased Cr, stop spironolactone, start IVF NS 93ml/HR  -2/12 Cr slightly improved, continue IVF, encourage oral fluids  -2/13 recheck BMP- CR down to 1.2  -2/14 DC IVF, discussed oral hydration  2/17 BUN/CR stable 17/1.25  -2/18 continue to encourage oral hydration 15.  History of CHF.  Last EF 55%, mild diastolic dysfunction  -Continue metoprolol, decrease spironolactone to 12.5 mg as above -2/14 Wts stable, has not appear to show signs of fluid overload, continue to monitor -02/28/23 wt up but doubt accuracy, monitor, no overload on exam -2/18 weight has been stable past few days, does not appear to have signs of fluid overload  Filed Weights   03/01/23 0437 03/02/23 0607 03/03/23 0500  Weight: 88.9 kg 88.7 kg 88.9 kg    16.  Severe cervical stenosis: Consider neurosurgery outpatient follow-up 17.  Insomnia -Patient has Seroquel 50 mg scheduled and 25 mg as needed.  Will also add in 3 mg melatonin at bedtime  -2/11 increase Seroquel to 75 mg nightly  -2/13 asked nursing to complete sleep-wake chart  -2/14 reports sleeping better, continue current regimen for now and monitor  -2/18 sleeping better  18.  Bleeding from his penis  -UA neg,HGB was stable on 2/11 -Denies additional occurrences.  During team meeting was noted this happened intermittently during acute hospital stay  19. Nasal congestion: Flonase 1 spray daily  20. Staring episodes/vision changes 03/01/23 -03/01/23 having staring episodes and ?chronic vision changes/hemianopsia? Spoke with Dr. Derry Lory of neuro, will get 24hr EEG and head CT, no code stroke at this point; they will see pt, appreciate their assistance.  -2/17 overnight EEG neg, CT head without acute changes  21. Chest pain  -EKG, Trop, CXR ordered  LOS: 10 days A FACE TO FACE EVALUATION WAS PERFORMED  Fanny Dance 03/03/2023, 10:01 AM

## 2023-03-03 NOTE — Progress Notes (Signed)
 Occupational Therapy Session Note  Patient Details  Name: Edward Rodgers MRN: [id number] Date of Birth: 1950/12/27  Today's Date: 03/03/2023 OT Individual Time: 7829-5621 OT Individual Time Calculation (min): 70 min   Short Term Goals: Week 2:  OT Short Term Goal 1 (Week 2): Pt with threading LB garments with supervision, including hemi-body. OT Short Term Goal 2 (Week 2): Pt will integrate LUE in functional tasks with Min cuing. OT Short Term Goal 3 (Week 2): Pt will maintain midline orientation in static standing with Min A for >2 mins.  Skilled Therapeutic Interventions/Progress Updates:   Pt greeted resting in bed for skilled OT session with focus on BADL retraining and functional transfers/standing tolerance & balance.   Pain: Pt with un-rated pain in chest, vitals monitored throughout, LPN already aware. Increased SOB noted, OT offering intermediate rest breaks and positioning suggestions throughout session to address pain/fatigue and maximize participation/safety in session.   Functional Transfers: Supine>sit EOB with Mod A, increased levels of assistance due to chest pain, cuing for technique. Sit<>stands from EOB with Min-light Mod A, cuing for forward weight-shift. Stand balance with Mod A, cuing for midline orientation during LB care.   Self Care Tasks: Pt completes the following self care tasks with levels of assistance noted below, UB: Pt able to doff/don long-sleeve shirt with Min A. Bathing with A for thoroughness of RUE cleaning. LB: Undressing/dressing with Mod-Max A, bathing with Min A for buttock in standing.   Emphasis placed on integration of LUE into functional bathing tasks, OT providing support at elbow/shoulder to increased functional reach with limb. Pt able to apply deodorant this session, wash upper L thigh, and wring washcloth with L-hand.   Pt remained in care of NT on BSC/toilte with 4Ps assessed and immediate needs met. Pt continues to be appropriate for  skilled OT intervention to promote further functional independence in ADLs/IADLs.   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 Restrictions Weight Bearing Restrictions Per Provider Order: No   Therapy/Group: Individual Therapy  Lou Cal, OTR/L, MSOT  03/03/2023, 6:12 AM

## 2023-03-03 NOTE — Progress Notes (Signed)
 Occupational Therapy Session Note  Patient Details  Name: Edward Rodgers MRN: [id number] Date of Birth: [birth year]  {CHL IP REHAB OT TIME CALCULATIONS:[id number]}   Short Term Goals: Week 2:  OT Short Term Goal 1 (Week 2): Pt with threading LB garments with supervision, including hemi-body. OT Short Term Goal 2 (Week 2): Pt will integrate LUE in functional tasks with Min cuing. OT Short Term Goal 3 (Week 2): Pt will maintain midline orientation in static standing with Min A for >2 mins.  Skilled Therapeutic Interventions/Progress Updates:  Pt greeted *** for skilled OT session with focus on ***.   Pain: Pt reported ***/10 pain, stating "***" in reference to ***. OT offering intermediate rest breaks and positioning suggestions throughout session to address pain/fatigue and maximize participation/safety in session.   Functional Transfers:  Self Care Tasks: Pt completes the following self care tasks with levels of assistance noted below, UB: LB:   Therapeutic Activities:  Therapeutic Exercise:   Education:  Pt remained *** with 4Ps assessed and immediate needs met. Pt continues to be appropriate for skilled OT intervention to promote further functional independence in ADLs/IADLs.   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 Restrictions Weight Bearing Restrictions Per Provider Order: No   Therapy/Group: Individual Therapy  Lou Cal, OTR/L, MSOT  03/03/2023, 6:45 PM

## 2023-03-03 NOTE — Progress Notes (Addendum)
 Anterior, pleuritic chest pain "some better". Does not want to participate with therapy. He is in bed, alert, in NAD. No dyspnea, diaphoresis, nausea. No ankle edema.  VS: T 98.2 RR 17 pulse 89 BP 125/75 SpO2 90% on RA  EKG: no ST elevation to my review; chest x-ray also without volume overload/infiltrates to my review. Report pending.  High sensitivity troponin: 53; second draw done>41.  Messaged cardiology to review case. He was on aldactone 25 mg daily and Jardiance 12.5 mg daily at home>>not re-started. Last EF 55%, mild diastolic dysfunction. Weight has been stable without signs of fluid overload.   1542 hrs: Added supplemental O2 via [place] for mild hypoxia when OOB. A bit more dyspneic this afternoon. Paging [employer] again. My earlier page did not transmit. They will eval.

## 2023-03-04 ENCOUNTER — Inpatient Hospital Stay (HOSPITAL_COMMUNITY): Payer: Medicare Other

## 2023-03-04 ENCOUNTER — Inpatient Hospital Stay (HOSPITAL_BASED_OUTPATIENT_CLINIC_OR_DEPARTMENT_OTHER): Payer: Medicare Other

## 2023-03-04 ENCOUNTER — Other Ambulatory Visit (HOSPITAL_COMMUNITY): Payer: Self-pay

## 2023-03-04 ENCOUNTER — Telehealth (HOSPITAL_COMMUNITY): Payer: Self-pay | Admitting: Pharmacy Technician

## 2023-03-04 DIAGNOSIS — K5900 Constipation, unspecified: Secondary | ICD-10-CM | POA: Diagnosis not present

## 2023-03-04 DIAGNOSIS — R079 Chest pain, unspecified: Secondary | ICD-10-CM | POA: Diagnosis not present

## 2023-03-04 DIAGNOSIS — R072 Precordial pain: Secondary | ICD-10-CM

## 2023-03-04 DIAGNOSIS — I63531 Cerebral infarction due to unspecified occlusion or stenosis of right posterior cerebral artery: Secondary | ICD-10-CM | POA: Diagnosis not present

## 2023-03-04 DIAGNOSIS — I10 Essential (primary) hypertension: Secondary | ICD-10-CM | POA: Diagnosis not present

## 2023-03-04 DIAGNOSIS — Z86711 Personal history of pulmonary embolism: Secondary | ICD-10-CM | POA: Diagnosis not present

## 2023-03-04 DIAGNOSIS — I5032 Chronic diastolic (congestive) heart failure: Secondary | ICD-10-CM

## 2023-03-04 DIAGNOSIS — N179 Acute kidney failure, unspecified: Secondary | ICD-10-CM | POA: Diagnosis not present

## 2023-03-04 LAB — CBC
HCT: 35.7 % — ABNORMAL LOW (ref 39.0–52.0)
Hemoglobin: 11.4 g/dL — ABNORMAL LOW (ref 13.0–17.0)
MCH: 27.8 pg (ref 26.0–34.0)
MCHC: 31.9 g/dL (ref 30.0–36.0)
MCV: 87.1 fL (ref 80.0–100.0)
Platelets: 179 10*3/uL (ref 150–400)
RBC: 4.1 MIL/uL — ABNORMAL LOW (ref 4.22–5.81)
RDW: 14.2 % (ref 11.5–15.5)
WBC: 3.8 10*3/uL — ABNORMAL LOW (ref 4.0–10.5)
nRBC: 0 % (ref 0.0–0.2)

## 2023-03-04 LAB — BASIC METABOLIC PANEL
Anion gap: 12 (ref 5–15)
BUN: 24 mg/dL — ABNORMAL HIGH (ref 8–23)
CO2: 21 mmol/L — ABNORMAL LOW (ref 22–32)
Calcium: 9.5 mg/dL (ref 8.9–10.3)
Chloride: 102 mmol/L (ref 98–111)
Creatinine, Ser: 1.44 mg/dL — ABNORMAL HIGH (ref 0.61–1.24)
GFR, Estimated: 52 mL/min — ABNORMAL LOW (ref >60)
Glucose, Bld: 105 mg/dL — ABNORMAL HIGH (ref 70–99)
Potassium: 3.8 mmol/L (ref 3.5–5.1)
Sodium: 135 mmol/L (ref 135–145)

## 2023-03-04 LAB — PROTIME-INR
INR: 1.2 (ref 0.8–1.2)
Prothrombin Time: 15 s (ref 11.4–15.2)

## 2023-03-04 LAB — APTT: aPTT: 33 s (ref 24–36)

## 2023-03-04 LAB — ECHOCARDIOGRAM LIMITED
Area-P 1/2: 4.89 cm2
Calc EF: 34.3 %
Height: 69 in
S' Lateral: 3.2 cm
Single Plane A2C EF: 23.5 %
Single Plane A4C EF: 50.2 %
Weight: 3132.3 [oz_av]

## 2023-03-04 LAB — HEPARIN LEVEL (UNFRACTIONATED)
Heparin Unfractionated: <0.1 [IU]/mL — ABNORMAL LOW (ref 0.30–0.70)
Heparin Unfractionated: 0.41 [IU]/mL (ref 0.30–0.70)

## 2023-03-04 LAB — LACTIC ACID, PLASMA: Lactic Acid, Venous: 0.8 mmol/L (ref 0.5–1.9)

## 2023-03-04 LAB — BRAIN NATRIURETIC PEPTIDE: B Natriuretic Peptide: 285.8 pg/mL — ABNORMAL HIGH (ref 0.0–100.0)

## 2023-03-04 LAB — TROPONIN I (HIGH SENSITIVITY): Troponin I (High Sensitivity): 83 ng/L — ABNORMAL HIGH (ref <18)

## 2023-03-04 MED ORDER — HEPARIN (PORCINE) 25000 UT/250ML-% IV SOLN
1200.0000 [IU]/h | INTRAVENOUS | Status: DC
  Administered 2023-03-04 – 2023-03-06 (×3): 1200 [IU]/h via INTRAVENOUS
  Filled 2023-03-04 (×3): qty 250

## 2023-03-04 MED ORDER — PERFLUTREN LIPID MICROSPHERE
1.0000 mL | INTRAVENOUS | Status: AC | PRN
  Administered 2023-03-04: 4 mL via INTRAVENOUS

## 2023-03-04 MED ORDER — SODIUM CHLORIDE 0.9 % IV SOLN: INTRAVENOUS | Status: AC

## 2023-03-04 MED ORDER — IOHEXOL 350 MG/ML SOLN
75.0000 mL | Freq: Once | INTRAVENOUS | Status: AC | PRN
  Administered 2023-03-04: 75 mL via INTRAVENOUS

## 2023-03-04 NOTE — Progress Notes (Signed)
    Latest Ref Rng & Units 03/04/2023   12:11 PM 03/02/2023    5:45 AM 02/26/2023   11:39 AM  BMP  Glucose 70 - 99 mg/dL 962  952  96   BUN 8 - 23 mg/dL 24  17  22    Creatinine 0.61 - 1.24 mg/dL 8.41  3.24  4.01   Sodium 135 - 145 mmol/L 135  137  137   Potassium 3.5 - 5.1 mmol/L 3.8  4.2  3.9   Chloride 98 - 111 mmol/L 102  103  104   CO2 22 - 32 mmol/L 21  22  24    Calcium 8.9 - 10.3 mg/dL 9.5  9.6  02.7     Start IVFs: NS at 75 cc/hr from 1800-0600 hours

## 2023-03-04 NOTE — Progress Notes (Signed)
 Bilateral lower extremity venous duplex has been completed.  Results can be found in chart review under CV Proc.  03/04/2023 5:22 PM  [employer], RVT.

## 2023-03-04 NOTE — Progress Notes (Signed)
 Echocardiogram 2D Echocardiogram has been performed.  Lucendia Herrlich 03/04/2023, 12:21 PM

## 2023-03-04 NOTE — Progress Notes (Signed)
 Elevated D-dimer. Given SOB requiring O2 and pleuritic chest pain, will obtain stat CTA to rule out PE. If positive, will need neurology input prior to anticoagulation given MRI brain imaging with suspected subcentimeter hemorrhage centrally in the right thalamic infarct. Subsequent CT head imaging negative for hemorrhage.

## 2023-03-04 NOTE — Plan of Care (Signed)
 Discussed with Dr. Natale Lay, pt new b/l PE with large clot burden. He had R scattered PCA stroke on MRI 2/2 with small hemorrhagic conversion at right thalamus. Repeat CT on 2/16 showed no evidence of visible hemorrhage. From neuro standpoint, OK for anticoagulation. Agree with heparin IV but recommend per stroke protocol without bolus and with heparin level 0.3-0.5. If able to transition to DOAC later, I feel OK with loading phase if within 7 days. Please call with further questions.  Marvel Plan, MD PhD Stroke Neurology 03/04/2023 11:40 AM

## 2023-03-04 NOTE — Consult Note (Signed)
 NAME:  Edward Rodgers, MRN:  [id number], DOB:  1950/11/25, LOS: 11 ADMISSION DATE:  02/21/2023, CONSULTATION DATE:  2/19 REFERRING MD: Natale Lay, MD  , CHIEF COMPLAINT: PE   History of Present Illness:  73 year old male with past medical history of hypertension, HFpEF, gout, hypothyroid, tobacco use, stroke. Initially presented to ED on 1/20 with new onself left sided weakness and disconjugate gaze. 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 He received TNK. He subsequently had small right thalamic hemorrhage and punctate left occipital infarct. He was placed on ASA, Plavix. He was admitted to inpatient rehab on 2/08.  Found to have AKI on 2/12 and started on gentle IVF. On 2/16, began having what is documented as hemianopsia and zoning out. They ordered EEG and CT head which was negative. On 2/18, patient reported having chest pain, worse with inspiration. EKG non ischemic. Trop and CXR were ordered. Troponin resulted 53>41>83. He had d-dimer which was >20. BNP 323.5. Cardiology was consulted. Additionally, in PM started requiring supplemental O2 for hypoxia. A CT PE study was ordered  which demonstrated large volume PE in right and left pulmonary arteries, lobar branches, segmental branches. RV:LV ratio 1.2. Started on heparin gtt. CCM was consulted for help with management.   Pertinent  Medical History  hypertension, HFpEF, gout, hypothyroid, tobacco use, stroke.  Significant Hospital Events: Including procedures, antibiotic start and stop dates in addition to other pertinent events   2/8: admit to rehab after having stroke s/p TNK  2/18: chest pain, pleuritic symptoms. Low positive troponin, BNP elevated. New O2 requirement.  2/19: CTA PE study + submassive bilateral PE. CCM consulted, heparin gtt started   Interim History / Subjective:  Alert on exam. Appears a bit breathless when talking. He feels his shortness of breath is more noticeable today. He denies having chest pain, presyncope/syncope.  Denies palpitations. He is aware of his PE.   Objective   Blood pressure 119/79, pulse 83, temperature 97.9 F (36.6 C), temperature source Oral, resp. rate 16, height 5\' 9"  (1.753 m), weight 88.8 kg, SpO2 96%.        Intake/Output Summary (Last 24 hours) at 03/04/2023 1413 Last data filed at 03/04/2023 0913 Gross per 24 hour  Intake 75 ml  Output --  Net 75 ml   Filed Weights   03/02/23 0607 03/03/23 0500 03/04/23 0500  Weight: 88.7 kg 88.9 kg 88.8 kg    Examination: General: older male, laying in bed, no acute distress HENT: [place]/AT, anicteric sclera, nasal cannula, mmm Lungs: clear bilaterally, no wheezing, rhonchi, rales, 2LNC Cardiovascular: s1s2, no murmur, rub, gallop  Abdomen: rounded, soft  Extremities: no pitting edema Neuro: alert, oriented, left side weaker GU: defer  Resolved Hospital Problem list     Assessment & Plan:  Acute submassive pulmonary embolism  Patient with chest pain beginning 2/18, pleuritic symptoms. CTA on 2/19 demonstrating large volume pulmonary embolus bilateral pulmonary arteries, lobar and segmental brances. RV to LV 1.2. new O2 requirement with 2LNC. No syncope/presyncope or hemodynamic changes.  - con't heparin gtt, will eventually need transition to DOAC - f/u echocardiogram  - ordered bilateral venous duplex, f/u  - close hemodynamic monitoring  - no indication for TNK or IR thrombectomy at this time  - no indication for transfer to ICU at this time  If any changes including syncope/presyncope, hemodynamic instability occur please consult PCCM for assistance and intervention. Thank you.   Remainder of chronic and acute problems to be managed by rehab team  Best  Practice (right click and "Reselect all SmartList Selections" daily)  Per primary team  Diet/type:  DVT prophylaxis:  Pressure ulcer(s).  GI prophylaxis:  Lines:  Foley:   Code Status:   Last date of multidisciplinary goals of care discussion []   Labs   CBC: Recent  Labs  Lab 03/02/23 0545 03/04/23 1211  WBC 6.0 3.8*  HGB 11.7* 11.4*  HCT 36.7* 35.7*  MCV 87.6 87.1  PLT 194 179    Basic Metabolic Panel: Recent Labs  Lab 02/26/23 1139 03/02/23 0545 03/04/23 1211  NA 137 137 135  K 3.9 4.2 3.8  CL 104 103 102  CO2 24 22 21*  GLUCOSE 96 101* 105*  BUN 22 17 24*  CREATININE 1.20 1.25* 1.44*  CALCIUM 10.1 9.6 9.5   GFR: Estimated Creatinine Clearance: 51.8 mL/min (A) (by C-G formula based on SCr of 1.44 mg/dL (H)). Recent Labs  Lab 03/02/23 0545 03/04/23 1211  WBC 6.0 3.8*  LATICACIDVEN  --  0.8    Liver Function Tests: No results for input(s): "AST", "ALT", "ALKPHOS", "BILITOT", "PROT", "ALBUMIN" in the last 168 hours. No results for input(s): "LIPASE", "AMYLASE" in the last 168 hours. No results for input(s): "AMMONIA" in the last 168 hours.  ABG    Component Value Date/Time   TCO2 26 02/14/2023 0407     Coagulation Profile: Recent Labs  Lab 03/04/23 1211  INR 1.2    Cardiac Enzymes: No results for input(s): "CKTOTAL", "CKMB", "CKMBINDEX", "TROPONINI" in the last 168 hours.  HbA1C: Hgb A1c MFr Bld  Date/Time Value Ref Range Status  02/15/2023 05:01 AM 6.4 (H) 4.8 - 5.6 % Final    Comment:    (NOTE) Pre diabetes:          5.7%-6.4%  Diabetes:              >6.4%  Glycemic control for   <7.0% adults with diabetes   03/29/2013 05:00 AM 5.8 (H) <5.7 % Final    Comment:    (NOTE)                                                                       According to the ADA Clinical Practice Recommendations for 2011, when HbA1c is used as a screening test:  >=6.5%   Diagnostic of Diabetes Mellitus           (if abnormal result is confirmed) 5.7-6.4%   Increased risk of developing Diabetes Mellitus References:Diagnosis and Classification of Diabetes Mellitus,Diabetes Care,2011,34(Suppl 1):S62-S69 and Standards of Medical Care in         Diabetes - 2011,Diabetes Care,2011,34 (Suppl 1):S11-S61.    CBG: No  results for input(s): "GLUCAP" in the last 168 hours.  Review of Systems:   As above  Past Medical History:  He,  has a past medical history of Cancer (HCC) (2002), CHF (congestive heart failure) (HCC), Gout, Hypertension, and Thyroid disease.   Surgical History:   Past Surgical History:  Procedure Laterality Date   COLONOSCOPY N/A 07/15/2017   Procedure: COLONOSCOPY;  Surgeon: Malissa Hippo, MD;  Location: [facility];  Service: Endoscopy;  Laterality: N/A;  830   IR CT HEAD LTD  02/14/2023   IR PERCUTANEOUS ART THROMBECTOMY/INFUSION INTRACRANIAL INC DIAG  ANGIO  02/14/2023   LOOP RECORDER INSERTION N/A 02/20/2023   Procedure: LOOP RECORDER INSERTION;  Surgeon: Duke Salvia, MD;  [facility] INVASIVE CV LAB;  Service: Cardiovascular;  Laterality: N/A;   POLYPECTOMY  07/15/2017   Procedure: POLYPECTOMY;  Surgeon: Malissa Hippo, MD;  Location: [facility];  Service: Endoscopy;;  colon   PROSTATE SURGERY     RADIOLOGY WITH ANESTHESIA Left 02/14/2023   Procedure: IR WITH ANESTHESIA;  Surgeon: Radiologist, Medication, MD;  Location: [facility] OR;  Service: Radiology;  Laterality: Left;   RADIOLOGY WITH ANESTHESIA N/A 02/14/2023   Procedure: IR WITH ANESTHESIA;  Surgeon: Radiologist, Medication, MD;  Location: [facility] OR;  Service: Radiology;  Laterality: N/A;     Social History:   reports that he has quit smoking. He has never used smokeless tobacco. He reports that he does not drink alcohol and does not use drugs.   Family History:  His family history is not on file.   Allergies No Known Allergies   Home Medications  Prior to Admission medications   Medication Sig Start Date End Date Taking? Authorizing Provider  alendronate (FOSAMAX) 70 MG tablet Take 70 mg by mouth once a week. Take with a full glass of water on an empty stomach. mondays    [provider]  allopurinol (ZYLOPRIM) 300 MG tablet Take 300 mg by mouth every morning.    [provider]  amLODipine (NORVASC) 10  MG tablet Take 1 tablet (10 mg total) by mouth daily. 02/22/23   Kara Mead, NP  aspirin EC 81 MG tablet Take 1 tablet (81 mg total) by mouth daily. Swallow whole. 02/22/23   Kara Mead, NP  atorvastatin (LIPITOR) 80 MG tablet Take 80 mg by mouth daily.    [provider]  calcium-vitamin D (OSCAL WITH D) 500-5 MG-MCG tablet Take 1 tablet by mouth daily. 02/22/23   Kara Mead, NP  clopidogrel (PLAVIX) 75 MG tablet Take 1 tablet (75 mg total) by mouth daily. 02/22/23   Kara Mead, NP  empagliflozin (JARDIANCE) 25 MG TABS tablet Take 12.5 mg by mouth daily.    [provider]  feeding supplement (ENSURE ENLIVE / ENSURE PLUS) LIQD Take 237 mLs by mouth 2 (two) times daily between meals. 02/21/23   Kara Mead, NP  latanoprost (XALATAN) 0.005 % ophthalmic solution Place 1 drop into the right eye at bedtime. 02/21/23   Kara Mead, NP  levothyroxine (SYNTHROID) 88 MCG tablet Take 1 tablet (88 mcg total) by mouth daily before breakfast. 02/22/23   Kara Mead, NP  metoprolol succinate (TOPROL-XL) 100 MG 24 hr tablet Take 1 tablet (100 mg total) by mouth daily. Take with or immediately following a meal. 02/22/23   Kara Mead, NP  spironolactone (ALDACTONE) 25 MG tablet Take 1 tablet (25 mg total) by mouth daily. 02/22/23   Kara Mead, NP  venlafaxine XR (EFFEXOR-XR) 150 MG 24 hr capsule Take 150 mg by mouth every evening.    [provider]     Critical care time: 66   Lenard Galloway [facility] 03/04/23 2:13 PM  Please see [email or web address] for pager details.  From 7A-7P if no response, please call [phone] After hours, please call ELink [phone]

## 2023-03-04 NOTE — Telephone Encounter (Signed)
 Patient Product/process development scientist completed.    The patient is insured through [facility]. Patient has Medicare and is not eligible for a copay card, but may be able to apply for patient assistance or Medicare RX Payment Plan (Patient Must reach out to their plan, if eligible for payment plan), if available.    Ran test claim for Eliquis 5 mg and the current 30 day co-pay is $302.00 due to a $255.00 deductible.  Will be $47.00 once deductible is met.  Ran test claim for Xarelto 20 mg and the current 30 day co-pay is $302.00 due to a $255.00 deductible.  Will be $47.00 once deductible is met.  This test claim was processed through [facility]- copay amounts may vary at other pharmacies due to pharmacy/plan contracts, or as the patient moves through the different stages of their insurance plan.     Roland Earl, CPHT Pharmacy Technician III Certified Patient Advocate [facility] Patient Advocate Team Direct Number: [phone]  Fax: [phone]

## 2023-03-04 NOTE — Progress Notes (Addendum)
 Laeticia, RT reported Pt is positive for DVT in LLE. Notified Cardiology and Benjie Karvonen, MD and on call [place], [place].

## 2023-03-04 NOTE — Progress Notes (Signed)
 PHARMACY - ANTICOAGULATION CONSULT NOTE  Pharmacy Consult for  Heparin IV Indication: acute bilateral PE, large clot burden in setting of acute CVA  No Known Allergies  Patient Measurements: Height: 5\' 9"  (175.3 cm) Weight: 88.8 kg (195 lb 12.3 oz) IBW/kg (Calculated) : 70.7 Heparin Dosing Weight: 88.8 kg  Vital Signs: Temp: 98.2 F (36.8 C) (02/19 1113) Temp Source: Oral (02/19 1113) BP: 128/86 (02/19 1113) Pulse Rate: 92 (02/19 1113)  Labs: Recent Labs    03/02/23 0545 03/03/23 1029 03/03/23 1200  HGB 11.7*  --   --   HCT 36.7*  --   --   PLT 194  --   --   CREATININE 1.25*  --   --   TROPONINIHS  --  53* 41*    Estimated Creatinine Clearance: 59.7 mL/min (A) (by C-G formula based on SCr of 1.25 mg/dL (H)).   Medical History: Past Medical History:  Diagnosis Date   Cancer (HCC) 2002   prostate   CHF (congestive heart failure) (HCC)    Gout    Hypertension    Thyroid disease     Medications:  Medications Prior to Admission  Medication Sig Dispense Refill Last Dose/Taking   alendronate (FOSAMAX) 70 MG tablet Take 70 mg by mouth once a week. Take with a full glass of water on an empty stomach. mondays      allopurinol (ZYLOPRIM) 300 MG tablet Take 300 mg by mouth every morning.      amLODipine (NORVASC) 10 MG tablet Take 1 tablet (10 mg total) by mouth daily. 30 tablet 0    aspirin EC 81 MG tablet Take 1 tablet (81 mg total) by mouth daily. Swallow whole. 14 tablet 0    atorvastatin (LIPITOR) 80 MG tablet Take 80 mg by mouth daily.      calcium-vitamin D (OSCAL WITH D) 500-5 MG-MCG tablet Take 1 tablet by mouth daily. 30 tablet 0    clopidogrel (PLAVIX) 75 MG tablet Take 1 tablet (75 mg total) by mouth daily. 30 tablet 1    empagliflozin (JARDIANCE) 25 MG TABS tablet Take 12.5 mg by mouth daily.      feeding supplement (ENSURE ENLIVE / ENSURE PLUS) LIQD Take 237 mLs by mouth 2 (two) times daily between meals. 237 mL 12    latanoprost (XALATAN) 0.005 %  ophthalmic solution Place 1 drop into the right eye at bedtime. 2.5 mL 12    levothyroxine (SYNTHROID) 88 MCG tablet Take 1 tablet (88 mcg total) by mouth daily before breakfast. 30 tablet 0    metoprolol succinate (TOPROL-XL) 100 MG 24 hr tablet Take 1 tablet (100 mg total) by mouth daily. Take with or immediately following a meal. 30 tablet 0    spironolactone (ALDACTONE) 25 MG tablet Take 1 tablet (25 mg total) by mouth daily. 30 tablet 0    venlafaxine XR (EFFEXOR-XR) 150 MG 24 hr capsule Take 150 mg by mouth every evening.      Scheduled:   allopurinol  300 mg Oral q morning   amLODipine  10 mg Oral Daily   aspirin EC  81 mg Oral Daily   atorvastatin  80 mg Oral Daily   calcium-vitamin D  1 tablet Oral Daily   clopidogrel  75 mg Oral Daily   feeding supplement  237 mL Oral BID BM   fluticasone  1 spray Each Nare Daily   latanoprost  1 drop Right Eye QHS   levothyroxine  88 mcg Oral QAC breakfast  lidocaine  1 patch Transdermal Q24H   melatonin  3 mg Oral QHS   metoprolol succinate  100 mg Oral Daily   mirabegron ER  50 mg Oral Daily   polyethylene glycol  17 g Oral Daily   QUEtiapine  75 mg Oral QHS   senna-docusate  1 tablet Oral QHS   venlafaxine XR  150 mg Oral QPM   Infusions:  Anti-infectives (From admission, onward)    None       Assessment:  73 y.o male with  new b/l PE with large clot burden. He had R scattered PCA stroke on MRI 2/2 with small hemorrhagic conversion at right thalamus. Repeat CT on 2/16 showed no evidence of visible hemorrhage. Dr. Lanier Clam reports he discussed plan with Neurologist, Dr. Roda Shutters who is OK for anticoagulation and agrees with heparin IV but recommends per stroke protocol without bolus and with heparin level 0.3-0.5. If able to transition to DOAC later and notes he is OK with loading phase if within 7 days. (See Dr. Warren Danes progress note).  Not on anticoagulation PTA.  Asprin 81 mg and Clopidogrel x 3 weeks, then clopidogrel alone. Begun  02/16/23.  Aspirin to stop on 03/08/23  RN reports no bleeding, but has moderate bruising on stomach.    Significant events:  2/1: S/p TNK 2/1 @ 0545 2/1: S/p thrombectomy 2/8: tx to CIR 2/18: CT angio chest:  bilateral PE , large clot burden and effects all lobes, evidence of right heart strain consistent with at least submassive PE    Goal of Therapy:  Heparin level 0.3-0.5 units/ml Monitor platelets by anticoagulation protocol: Yes   Plan:  Ordered STAT baseline heparin level, aPTT, CBC.  Start IV heparin drip 1200 units/hr (No bolus per MD order) Check 8 hour HL Monitor HL and CBC daily F/u for transition to Eliquis    Thank you for allowing pharmacy to be part of this patients care team. Noah Delaine, RPh Clinical Pharmacist  03/04/2023,12:07 PM

## 2023-03-04 NOTE — Progress Notes (Signed)
 Physical Therapy Session Note  Patient Details  Name: Edward Rodgers MRN: [id number] Date of Birth: 27-Apr-1950  Today's Date: 03/04/2023 PT Missed Time: 60 Minutes Missed Time Reason: MD hold (Comment) (bed rest (PE))  Short Term Goals: Week 2:  PT Short Term Goal 1 (Week 2): pt will perform bed mobility with CGA or less PT Short Term Goal 2 (Week 2): pt will ambulate 100 feet with RW and mod A or less PT Short Term Goal 3 (Week 2): pt will perform car transfer with mod A or less with LRAD  Skilled Therapeutic Interventions/Progress Updates:      Pt missed 60 minutes 2/2 medical hold/best rest.   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 Restrictions Weight Bearing Restrictions Per Provider Order: No  Therapy/Group: Individual Therapy  [facility]rose [place], [place], DPT  03/04/2023, 7:46 AM

## 2023-03-04 NOTE — Progress Notes (Signed)
 Notified by Cardiology yesterday around 1800 to report to nightshift if positive D-Dimer to Call the on call Cardiologist Fellow. Gave requested report to oncoming staff. Notified Fanny Dance, MD and [place], [place] this morning when seeing resulted D-Dimer. Pt is stable and on 2L [place] w/o chest pain and SOB.

## 2023-03-04 NOTE — Plan of Care (Signed)
  Problem: Consults Goal: RH STROKE PATIENT EDUCATION Description: See Patient Education module for education specifics  Outcome: Progressing   Problem: RH BOWEL ELIMINATION Goal: RH STG MANAGE BOWEL WITH ASSISTANCE Description: STG Manage Bowel with min Assistance. Outcome: Progressing Goal: RH STG MANAGE BOWEL W/MEDICATION W/ASSISTANCE Description: STG Manage Bowel with Medication with mod I  Assistance. Outcome: Progressing   Problem: RH BLADDER ELIMINATION Goal: RH STG MANAGE BLADDER WITH ASSISTANCE Description: STG Manage Bladder With min Assistance Outcome: Progressing Goal: RH STG MANAGE BLADDER WITH MEDICATION WITH ASSISTANCE Description: STG Manage Bladder With Medication With mod I Assistance. Outcome: Progressing   Problem: RH SKIN INTEGRITY Goal: RH STG SKIN FREE OF INFECTION/BREAKDOWN Description: Manage skin w min assist Outcome: Progressing Goal: RH STG MAINTAIN SKIN INTEGRITY WITH ASSISTANCE Description: STG Maintain Skin Integrity With min Assistance. Outcome: Progressing   Problem: RH SAFETY Goal: RH STG ADHERE TO SAFETY PRECAUTIONS W/ASSISTANCE/DEVICE Description: STG Adhere to Safety Precautions With cues  Assistance/Device. Outcome: Progressing   Problem: RH COGNITION-NURSING Goal: RH STG USES MEMORY AIDS/STRATEGIES W/ASSIST TO PROBLEM SOLVE Description: STG Uses Memory Aids/Strategies With cues Assistance to Problem Solve. Outcome: Progressing   Problem: RH KNOWLEDGE DEFICIT Goal: RH STG INCREASE KNOWLEDGE OF DIABETES Description: Patient and wife will be able to manage prediabetes using educational resources for medications and dietary modification independently Outcome: Progressing Goal: RH STG INCREASE KNOWLEDGE OF HYPERTENSION Description: Patient and wife will be able to manage HTN using educational resources for medications and dietary modification independently Outcome: Progressing Goal: RH STG INCREASE KNOWLEGDE OF  HYPERLIPIDEMIA Description: Patient and wife will be able to manage HLD using educational resources for medications and dietary modification independently Outcome: Progressing Goal: 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

## 2023-03-04 NOTE — Progress Notes (Signed)
 Speech Language Pathology Daily Session Note  Patient Details  Name: Edward Rodgers MRN: [id number] Date of Birth: [birth year]  Today's Date: 03/04/2023 SLP Individual Time: 9562-1308 SLP Individual Time Calculation (min): 33 min and Today's Date: 03/04/2023 SLP Missed Time:  27 Missed Time Reason: Patient fatigue;Other (Comment) (bedrest/sick)  Short Term Goals: Week 2: SLP Short Term Goal 1 (Week 2): Patient will solve basic environmental and functional problems with 80% accuracy given mod assist. SLP Short Term Goal 2 (Week 2): Patient will utilize external and internal memory aids to recall daily information with 75% accuracy given mod assist. SLP Short Term Goal 3 (Week 2): Patient will demonstrate awareness of deficits by stating 2 cognitive and 2 physical deficits when provided with mod verbal cues.  Skilled Therapeutic Interventions:   Pt greeted at bedside. He was awake and speaking to his PA upon SLP arrival, though fatigue was evident. Pt was able to independently recall the reason for current bedrest and difficulty breathing. Additionally, he was oriented w/o the use of his calendar. During conversational task re current deficits/progress in therapy thus far, he was able to recall 2 physical deficits w/ only s cues. However, he benefited from modA cues to recall 2 cognitive deficits. Throughout verbal tasks, he required frequent repetitions d/t fatigue and noted to fall asleep while speaking. After multiple repetitions, tx tasks were discontinued to allow the pt to rest given medical circumstances today. He was left in his bed with the call light within reach. Recommend cont ST per POC.   Pain  No pain reported   Therapy/Group: Individual Therapy  [name] 03/04/2023, 3:27 PM

## 2023-03-04 NOTE — Progress Notes (Signed)
 PHARMACY - ANTICOAGULATION CONSULT NOTE  Pharmacy Consult for  Heparin IV Indication: acute bilateral PE, large clot burden in setting of acute CVA  No Known Allergies  Patient Measurements: Height: 5\' 9"  (175.3 cm) Weight: 88.8 kg (195 lb 12.3 oz) IBW/kg (Calculated) : 70.7 Heparin Dosing Weight: 88.8 kg  Vital Signs: Temp: 98.2 F (36.8 C) (02/19 2004) Temp Source: Oral (02/19 2004) BP: 136/96 (02/19 2004) Pulse Rate: 94 (02/19 2004)  Labs: Recent Labs    03/02/23 0545 03/03/23 1029 03/03/23 1200 03/04/23 1211 03/04/23 2103  HGB 11.7*  --   --  11.4*  --   HCT 36.7*  --   --  35.7*  --   PLT 194  --   --  179  --   APTT  --   --   --  33  --   LABPROT  --   --   --  15.0  --   INR  --   --   --  1.2  --   HEPARINUNFRC  --   --   --  <0.10* 0.41  CREATININE 1.25*  --   --  1.44*  --   TROPONINIHS  --  53* 41* 83*  --     Estimated Creatinine Clearance: 51.8 mL/min (A) (by C-G formula based on SCr of 1.44 mg/dL (H)).   Medical History: Past Medical History:  Diagnosis Date   Cancer (HCC) 2002   prostate   CHF (congestive heart failure) (HCC)    Gout    Hypertension    Thyroid disease     Medications:  Medications Prior to Admission  Medication Sig Dispense Refill Last Dose/Taking   alendronate (FOSAMAX) 70 MG tablet Take 70 mg by mouth once a week. Take with a full glass of water on an empty stomach. mondays      allopurinol (ZYLOPRIM) 300 MG tablet Take 300 mg by mouth every morning.      amLODipine (NORVASC) 10 MG tablet Take 1 tablet (10 mg total) by mouth daily. 30 tablet 0    aspirin EC 81 MG tablet Take 1 tablet (81 mg total) by mouth daily. Swallow whole. 14 tablet 0    atorvastatin (LIPITOR) 80 MG tablet Take 80 mg by mouth daily.      calcium-vitamin D (OSCAL WITH D) 500-5 MG-MCG tablet Take 1 tablet by mouth daily. 30 tablet 0    clopidogrel (PLAVIX) 75 MG tablet Take 1 tablet (75 mg total) by mouth daily. 30 tablet 1    empagliflozin  (JARDIANCE) 25 MG TABS tablet Take 12.5 mg by mouth daily.      feeding supplement (ENSURE ENLIVE / ENSURE PLUS) LIQD Take 237 mLs by mouth 2 (two) times daily between meals. 237 mL 12    latanoprost (XALATAN) 0.005 % ophthalmic solution Place 1 drop into the right eye at bedtime. 2.5 mL 12    levothyroxine (SYNTHROID) 88 MCG tablet Take 1 tablet (88 mcg total) by mouth daily before breakfast. 30 tablet 0    metoprolol succinate (TOPROL-XL) 100 MG 24 hr tablet Take 1 tablet (100 mg total) by mouth daily. Take with or immediately following a meal. 30 tablet 0    spironolactone (ALDACTONE) 25 MG tablet Take 1 tablet (25 mg total) by mouth daily. 30 tablet 0    venlafaxine XR (EFFEXOR-XR) 150 MG 24 hr capsule Take 150 mg by mouth every evening.      Scheduled:   allopurinol  300 mg Oral q  morning   amLODipine  10 mg Oral Daily   atorvastatin  80 mg Oral Daily   calcium-vitamin D  1 tablet Oral Daily   feeding supplement  237 mL Oral BID BM   fluticasone  1 spray Each Nare Daily   latanoprost  1 drop Right Eye QHS   levothyroxine  88 mcg Oral QAC breakfast   lidocaine  1 patch Transdermal Q24H   melatonin  3 mg Oral QHS   metoprolol succinate  100 mg Oral Daily   mirabegron ER  50 mg Oral Daily   polyethylene glycol  17 g Oral Daily   QUEtiapine  75 mg Oral QHS   senna-docusate  1 tablet Oral QHS   venlafaxine XR  150 mg Oral QPM   Infusions:   sodium chloride 75 mL/hr at 03/04/23 1728   heparin 1,200 Units/hr (03/04/23 1256)   Anti-infectives (From admission, onward)    None       Assessment:  73 y.o male with  new b/l PE with large clot burden. He had R scattered PCA stroke on MRI 2/2 with small hemorrhagic conversion at right thalamus. Repeat CT on 2/16 showed no evidence of visible hemorrhage. Dr. Lanier Clam reports he discussed plan with Neurologist, Dr. Roda Shutters who is OK for anticoagulation and agrees with heparin IV but recommends per stroke protocol without bolus and with heparin  level 0.3-0.5. If able to transition to DOAC later and notes he is OK with loading phase if within 7 days. (See Dr. Warren Danes progress note).  Not on anticoagulation PTA.  Asprin 81 mg and Clopidogrel x 3 weeks, then clopidogrel alone. Begun 02/16/23.  Aspirin to stop on 03/08/23  Heparin therapeutic tonight. We will cont with current rate and check in AM.     Significant events:  2/1: S/p TNK 2/1 @ 0545 2/1: S/p thrombectomy 2/8: tx to CIR 2/18: CT angio chest:  bilateral PE , large clot burden and effects all lobes, evidence of right heart strain consistent with at least submassive PE  Goal of Therapy:  Heparin level 0.3-0.5 units/ml Monitor platelets by anticoagulation protocol: Yes   Plan:  Cont IV heparin drip 1200 units/hr  Monitor HL and CBC daily F/u for transition to Eliquis  Ulyses Southward, PharmD, BCIDP, AAHIVP, CPP Infectious Disease Pharmacist 03/04/2023 9:46 PM

## 2023-03-04 NOTE — Patient Care Conference (Signed)
 Inpatient RehabilitationTeam Conference and Plan of Care Update Date: 03/04/2023   Time: 12:05 PM    Patient Name: Edward Rodgers      Medical Record Number: [id number]  Date of Birth: 22-Mar-1950 Sex: Male         Room/Bed: 4M04C/4M04C-01 Payor Info: Payor: Multimedia programmer / Plan: [employer] MEDICARE / Product Type: *No Product type* /    Admit Date/Time:  02/21/2023  2:28 PM  Primary Diagnosis:  Acute right PCA stroke [facility])  Hospital Problems: Principal Problem:   Acute right PCA stroke (HCC) Active Problems:   Coping style affecting medical condition   Precordial pain   Shortness of breath   Benign hypertension   Heart failure with improved ejection fraction (HFimpEF) [facility])    Expected Discharge Date: Expected Discharge Date: 03/14/23  Team Members Present: Physician leading conference: Dr. Fanny Dance Social Worker Present: Dossie Der, LCSW Nurse Present: Chana Bode, RN PT Present: Ambrose Finland, PT OT Present: Lou Cal, OT SLP Present: [name], SLP PPS Coordinator present : Fae Pippin, SLP     Current Status/Progress Goal Weekly Team Focus  Bowel/Bladder   Patient [place] continent of bowel and bladder, uses urinal and bedpan   Pt will remain continent   Pt will maintain being continent    Swallow/Nutrition/ Hydration               ADL's   Min A for UB care; Mod A for LB care; Max A for toileting. Improved activation of LUE for functional tasks with Mod-Max cuing for attention to limb. Barriers: Recent medical set-backs, decreased attention to hemibody, decreased midline orientation in sitting/standing (improving daily).   Min A overall; will upgrade goals according to functional consistency   LUE/LLE NMR & standing balance/tolerance with ADLs    Mobility   bed mobility: min A for trunk with use of bed rails from flat bed, sit to stand min A, stand pivot transfer/squat pivot transfer mod A, gait x50 with RW and L UE splint, with  colored dots on floor, pt requires max visual cues for improved gait pattern 2/2 L LE scissoring/decreased proprioception   min A  D/C 3/1, follow up HHPT, barriers to discharge: SOB, chest pain, pt desatting to 86 on 2L O2 with attempt to sit EOB 2/18. Pulmonary embolism diagnosis 2/19  DME: TBD, pt will need a WC likely TIS 2/2 anterior slippage. Will plan consult with [facility] C[facility] from [facility].    Communication                Safety/Cognition/ Behavioral Observations  min to mod assist for task attention, mod for memory of daily events/new information, awareness of deficits is improving   min assist   basic problem solving, sustained attention, awareness of deficits, memory strategy use (internal and external aids)    Pain   Denied any pain   Maintain free of pain   Encourage/ educated pt using PAINAD.    Skin   Patient has stage 2 on sacrum   Treat current pressure injury  Turn pt q2 to prevent further skin breakdown.      Discharge Planning:  Wife and pt's brother to assist with his care. His brother has been here during therapies unsure if has participated will need too. Wife informed of need for ramp for steps at home   Team Discussion: Patient post right PCA CVA with new Bilateral PEs. Functional progress limited by cognitive deficits; decreased awareness of deficits, and poor sustained attention.  Patient on target to meet rehab goals: yes, currently needs min assist for bed mobility, min assist for upper body care and mod assist for lower body care and max assist for toileting. Patient needs min assist for sit - stand and mod assist for squat pivot transfers.  Able to ambulate up to 50' wearing a left UE splint with min assist and needs cues for decreased proprioception.    *See Care Plan and progress notes for long and short-term goals.   Revisions to Treatment Plan:  Cardiology consult Pulmonary consult   Teaching Needs: Loop recorder  management/monitoring Safety, medications, dietary modification, transfers, toileting, etc.   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, HTN, Constipation, AKI, B/L Pulmonary embolism Possible Resolutions to [employer] Focus: Critical care consult, heparin started for PE, monitor BP, [place] O2, monitor labs   Continued Need for Acute Rehabilitation Level of Care: The patient requires daily medical management by a physician with specialized training in physical medicine and rehabilitation for the following reasons: Direction of a multidisciplinary physical rehabilitation program to maximize functional independence : Yes Medical management of patient stability for increased activity during participation in an intensive rehabilitation regime.: Yes Analysis of laboratory values and/or radiology reports with any subsequent need for medication adjustment and/or medical intervention. : Yes   I attest that I was present, lead the team conference, and concur with the assessment and plan of the team.   Chana Bode B 03/04/2023, 2:39 PM

## 2023-03-04 NOTE — Progress Notes (Addendum)
 I was not able to round on him during morning rounds as the patient was in radiology getting his CT PE study.   Spoke to RN and attending physician who inform me he did not endorse any CP over night. Still on 2L [place] oxygen.   Will see him later today - evening rounds.   No charge.   Matheus Spiker [place], DO, FACC  10:49 AM   Went to see him on evening rounds but patient was in Vascular lab getting his LE venous duplex per RN.  Per RN, no chest pain and Shortness of breath remains stable.  Appreciate neurology and CCM team assistance given his complex clinical trajectory.  Will see him on rounds in the morning.   Aubriana Ravelo [place], DO, [facility] 5:15pm

## 2023-03-04 NOTE — Progress Notes (Signed)
 Notified by radiology that CTA positive for bilateral PE with large clot burden and evidence of right heart strain.   I have notified Dr. Natale Lay and paged neurology directly to his phone for clearance for heparin gtt.   I ordered a code PE protocol, but did NOT order heparin gtt until neurology clears.   I will defer IR consult to primary team.    Marcelino Duster, PA-C 03/04/2023, 11:29 AM [phone]

## 2023-03-04 NOTE — Progress Notes (Addendum)
 PROGRESS NOTE   Subjective/Complaints:  Patient reports he is not having any chest pain today.  Patient was seen by cardiology, appreciate assistance.  He is on oxygen therapy nasal cannula.  D-dimer elevated, getting CT PE study today to rule out PE.  ROS: as per HPI. Denies fever, HA, shortness of breath abd pain, N/V/D/C, or any other complaints at this time.   +CP-resolved   Objective:   DG Chest 2 View Result Date: 03/03/2023 CLINICAL DATA:  Shortness of breath. EXAM: CHEST - 2 VIEW COMPARISON:  Radiographs 06/08/2008 and 01/05/2004. FINDINGS: Lower lung volumes with mildly increased bibasilar atelectasis. No confluent airspace disease, pleural effusion or pneumothorax. The heart size and mediastinal contours are stable with mild aortic tortuosity. Loop recorder noted in the left anterior chest wall. There are stable degenerative changes in the spine without evidence of acute osseous abnormality. IMPRESSION: Lower lung volumes with mildly increased bibasilar atelectasis. No other evidence of acute cardiopulmonary process. Electronically Signed   By: Carey Bullocks M.D.   On: 03/03/2023 15:01   Recent Labs    03/02/23 0545  WBC 6.0  HGB 11.7*  HCT 36.7*  PLT 194    Recent Labs    03/02/23 0545  NA 137  K 4.2  CL 103  CO2 22  GLUCOSE 101*  BUN 17  CREATININE 1.25*  CALCIUM 9.6        Intake/Output Summary (Last 24 hours) at 03/04/2023 1109 Last data filed at 03/04/2023 0913 Gross per 24 hour  Intake 125 ml  Output 200 ml  Net -75 ml         Physical Exam: Vital Signs Blood pressure (!) 140/81, pulse 96, temperature 98 F (36.7 C), temperature source Oral, resp. rate 17, height 5\' 9"  (1.753 m), weight 88.8 kg, SpO2 94%.  Constitutional: NAD, Laying in bed HENT:     Head: Normocephalic and atraumatic.     Nose: Nose normal.     Mouth/Throat:     Mouth: Mucous membranes are moist.   Eyes:      Extraocular Movements: Extraocular movements intact.     Conjunctiva/sclera: Conjunctivae normal.     Pupils: Pupils are equal, round, and reactive to light.  Cardiovascular:     Rate and Rhythm: Normal rate and regular rhythm.  Pulmonary:     Effort: Pulmonary effort is normal. No respiratory distress. + [place] O2 2L    Breath sounds: No stridor. No wheezing or rhonchi.  Abdominal:     General: Bowel sounds are normal. There is no distension.     Palpations: Abdomen is soft.     Tenderness: There is no abdominal tenderness.  Musculoskeletal:        General: Swelling (trace LUE) present. Improving    Cervical back: Normal range of motion.      No pedal edema appreciated  No significant chest wall soreness noted Skin:    General: Skin is warm and dry.     Findings: No bruising or erythema.  Psychiatric:     Comments: Pt pleasant and cooperative  Neuro: alert, slightly dysarthric which is baseline, occasional staring off but no confusion and no postictal period.  PRIOR EXAMS: Neurological:     Mental Status: He is alert.     Comments: Alert and oriented x 4.  Slightly dysarthric speech.  Fair insight and awareness.  Language overall appears to be intact.  Left central 7.  Decreased sensation to light touch in left upper extremity and left lower extremity.  Moving right upper extremity and right lower extremity to gravity. Mildly increased tone R hand.    Assessment/Plan: 1. Functional deficits which require 3+ hours per day of interdisciplinary therapy in a comprehensive inpatient rehab setting. Physiatrist is providing close team supervision and 24 hour management of active medical problems listed below. Physiatrist and rehab team continue to assess barriers to discharge/monitor patient progress toward functional and medical goals  Care Tool:  Bathing    Body parts bathed by patient: Left arm, Chest, Abdomen, Front perineal area, Right upper leg, Left upper leg, Right lower leg,  Face   Body parts bathed by helper: Right arm, Buttocks, Left lower leg     Bathing assist Assist Level: Moderate Assistance - Patient 50 - 74%     Upper Body Dressing/Undressing Upper body dressing        Upper body assist Assist Level: Moderate Assistance - Patient 50 - 74%    Lower Body Dressing/Undressing Lower body dressing            Lower body assist Assist for lower body dressing: Maximal Assistance - Patient 25 - 49%     Toileting Toileting    Toileting assist Assist for toileting: Maximal Assistance - Patient 25 - 49%     Transfers Chair/bed transfer  Transfers assist     Chair/bed transfer assist level: Moderate Assistance - Patient 50 - 74% (squat pivot/stand pivot)     Locomotion Ambulation   Ambulation assist   Ambulation activity did not occur: Safety/medical concerns  Assist level: 2 helpers (+2 for WC follow) Assistive device: Walker-rolling Max distance: 50 feet   Walk 10 feet activity   Assist  Walk 10 feet activity did not occur: Safety/medical concerns  Assist level: 2 helpers Assistive device: Walker-rolling   Walk 50 feet activity   Assist Walk 50 feet with 2 turns activity did not occur: Safety/medical concerns  Assist level: 2 helpers Assistive device: Walker-rolling    Walk 150 feet activity   Assist Walk 150 feet activity did not occur: Safety/medical concerns         Walk 10 feet on uneven surface  activity   Assist Walk 10 feet on uneven surfaces activity did not occur: Safety/medical concerns         Wheelchair     Assist Is the patient using a wheelchair?: Yes Type of Wheelchair: Manual    Wheelchair assist level: Minimal Assistance - Patient > 75% Max wheelchair distance: 50    Wheelchair 50 feet with 2 turns activity    Assist        Assist Level: Minimal Assistance - Patient > 75%   Wheelchair 150 feet activity     Assist      Assist Level: Maximal Assistance -  Patient 25 - 49%   Blood pressure (!) 140/81, pulse 96, temperature 98 F (36.7 C), temperature source Oral, resp. rate 17, height 5\' 9"  (1.753 m), weight 88.8 kg, SpO2 94%.  Medical Problem List and Plan: 1. 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  Await plan for loop recorder             -  patient may shower -ELOS/Goals: 21-25 days, min assist with PT, OT and supervision with SLP -Continue CIR -Expected discharge 03/14/2023 -Team conference today please see physician documentation under team conference tab, met with team  to discuss problems,progress, and goals. Formulized individual treatment plan based on medical history, underlying problem and comorbidities.   2.  Antithrombotics: -DVT/anticoagulation:  Pharmaceutical: Heparin 5000U q8h -antiplatelet therapy: Aspirin 81 mg daily and Plavix 75 mg day x 3 weeks then Plavix alone 3. Pain Management: Lidoderm patch as directed, Tylenol as needed 4. Mood/Sleep/sundowning: Effexor 150 mg every evening, provide emotional support             -antipsychotic agents: Seroquel 50 mg nighty at 2030, have back up dose             -keep sleep chart 5. Neuropsych/cognition: This patient is capable of making decisions on his own behalf. 6. Skin/Wound Care: Routine skin checks 7. Fluids/Electrolytes/Nutrition: Routine in and outs with follow-up chemistries, continue vitamins/supplements 8.  Hypothyroidism.  Synthroid 53mcg/day 9.  Hypertension. Norvasc 10 mg daily, Toprol-Xl 100 mg daily, Aldactone 25 mg daily.             -bp trending up. Likely will need additions to regimen  -02/22/23 BPs 140s/90s, not terrible, monitor trend with increase mobility  2/10 BP controlled, decrease spironolactone due to elevated CR/BUN -02/28/23 BP stable but creeping up, continue to monitor for now but may need to increase meds back -2/19 BP controlled continue current regimen   Vitals:    03/01/23 0437 03/01/23 1248 03/01/23 1929 03/02/23 0446  BP: 134/84 133/82 (!) 153/88 (!) 142/92   03/02/23 2004 03/03/23 0352 03/03/23 0825 03/03/23 1200  BP: (!) 146/92 130/84 (!) 129/98 125/75   03/03/23 1900 03/03/23 2300 03/04/23 0359 03/04/23 0750  BP: 128/84 121/73 131/86 (!) 140/81    10.H/O prostate cancer. Myrbetriq 50 mg daily. 11.Hyperlipidemia. Lipitor 80mg  daily 12.Constipation.             -miralax daily, SenokotS 1 tab daily -02/22/23 LBM 2days ago, monitor today, if no BM tomorrow then may want to increase regimen -2/19 LBM today, having fairly regular bowel movements 13.H/O gout. Zyloprim 300mg  daily 14.  AKI versus CKD  -Encourage oral fluid intake, decrease spironolactone to 12.5 mg  -2/11 recheck BMP today  Addendum- increased Cr, stop spironolactone, start IVF NS 76ml/HR  -2/12 Cr slightly improved, continue IVF, encourage oral fluids  -2/13 recheck BMP- CR down to 1.2  -2/14 DC IVF, discussed oral hydration  2/17 BUN/CR stable 17/1.25  -2/18 continue to encourage oral hydration  Recheck tomorrow BMP 15.  History of CHF.  Last EF 55%, mild diastolic dysfunction  -Continue metoprolol, decrease spironolactone to 12.5 mg as above -2/14 Wts stable, has not appear to show signs of fluid overload, continue to monitor -02/28/23 wt up but doubt accuracy, monitor, no overload on exam -2/19 weight stable, does not appear to show signs of fluid overload  Filed Weights   03/02/23 0607 03/03/23 0500 03/04/23 0500  Weight: 88.7 kg 88.9 kg 88.8 kg    16.  Severe cervical stenosis: Consider neurosurgery outpatient follow-up 17. Insomnia -Patient has Seroquel 50 mg scheduled and 25 mg as needed.  Will also add in 3 mg melatonin at bedtime  -2/11 increase Seroquel to 75 mg nightly  -2/13 asked nursing to complete sleep-wake chart  -2/14 reports sleeping better, continue current regimen for now and monitor  -2/19 patient reports he slept well last night, continue current  regimen for now, could consider decreasing Seroquel at a later day  18.  Bleeding from his penis  -UA neg,HGB was stable on 2/11 -Denies additional occurrences.  During team meeting was noted this happened intermittently during acute hospital stay  19. Nasal congestion: Flonase 1 spray daily  20. Staring episodes/vision changes 03/01/23 -03/01/23 having staring episodes and ?chronic vision changes/hemianopsia? Spoke with Dr. Derry Lory of neuro, will get 24hr EEG and head CT, no code stroke at this point; they will see pt, appreciate their assistance.  -2/17 overnight EEG neg, CT head without acute changes  21. Chest pain  -EKG, Trop, CXR ordered  -Cardiology following appreciate assistance.  D-dimer positive, getting CT PE study to rule out PE.  If positive will need to discuss with neurology regarding anticoagulation.  He is now on 2 L nasal cannula oxygen  Addendum: PE on CT noted, Discussed with neurology and cardiology. Heparin grip started, wife updated by phone. Critical care medicine consulted  LOS: 11 days A FACE TO FACE EVALUATION WAS PERFORMED  Fanny Dance 03/04/2023, 11:09 AM

## 2023-03-05 DIAGNOSIS — I82432 Acute embolism and thrombosis of left popliteal vein: Secondary | ICD-10-CM

## 2023-03-05 DIAGNOSIS — I2699 Other pulmonary embolism without acute cor pulmonale: Secondary | ICD-10-CM

## 2023-03-05 DIAGNOSIS — I5022 Chronic systolic (congestive) heart failure: Secondary | ICD-10-CM

## 2023-03-05 DIAGNOSIS — N179 Acute kidney failure, unspecified: Secondary | ICD-10-CM

## 2023-03-05 LAB — CBC
HCT: 33 % — ABNORMAL LOW (ref 39.0–52.0)
Hemoglobin: 10.8 g/dL — ABNORMAL LOW (ref 13.0–17.0)
MCH: 28.6 pg (ref 26.0–34.0)
MCHC: 32.7 g/dL (ref 30.0–36.0)
MCV: 87.5 fL (ref 80.0–100.0)
Platelets: 199 10*3/uL (ref 150–400)
RBC: 3.77 MIL/uL — ABNORMAL LOW (ref 4.22–5.81)
RDW: 14.3 % (ref 11.5–15.5)
WBC: 5.8 10*3/uL (ref 4.0–10.5)
nRBC: 0 % (ref 0.0–0.2)

## 2023-03-05 LAB — BASIC METABOLIC PANEL
Anion gap: 9 (ref 5–15)
BUN: 21 mg/dL (ref 8–23)
CO2: 21 mmol/L — ABNORMAL LOW (ref 22–32)
Calcium: 9 mg/dL (ref 8.9–10.3)
Chloride: 106 mmol/L (ref 98–111)
Creatinine, Ser: 1.25 mg/dL — ABNORMAL HIGH (ref 0.61–1.24)
GFR, Estimated: >60 mL/min (ref >60)
Glucose, Bld: 96 mg/dL (ref 70–99)
Potassium: 3.8 mmol/L (ref 3.5–5.1)
Sodium: 136 mmol/L (ref 135–145)

## 2023-03-05 LAB — HEPARIN LEVEL (UNFRACTIONATED): Heparin Unfractionated: 0.37 [IU]/mL (ref 0.30–0.70)

## 2023-03-05 MED ORDER — QUETIAPINE FUMARATE 50 MG PO TABS
50.0000 mg | ORAL_TABLET | Freq: Every day | ORAL | Status: DC
  Administered 2023-03-05: 50 mg via ORAL
  Filled 2023-03-05: qty 1

## 2023-03-05 MED ORDER — LOSARTAN POTASSIUM 25 MG PO TABS
25.0000 mg | ORAL_TABLET | Freq: Every morning | ORAL | Status: DC
Start: 2023-03-06 — End: 2023-03-10
  Administered 2023-03-06 – 2023-03-09 (×4): 25 mg via ORAL
  Filled 2023-03-05 (×4): qty 1

## 2023-03-05 NOTE — Progress Notes (Signed)
 Rounding Note    Patient Name: Edward Rodgers Date of Encounter: 03/05/2023  [facility] Cardiologist: Tessa Lerner, DO  Chief Complaint: Inpatient rehab status post stroke Reason of consult: Pleuritic chest pain and shortness of breath  Subjective   Patient seen and examined at bedside. No family present. Denies anginal chest pain. Shortness of breath improving  Inpatient Medications    Scheduled Meds:  allopurinol  300 mg Oral q morning   atorvastatin  80 mg Oral Daily   calcium-vitamin D  1 tablet Oral Daily   feeding supplement  237 mL Oral BID BM   fluticasone  1 spray Each Nare Daily   latanoprost  1 drop Right Eye QHS   levothyroxine  88 mcg Oral QAC breakfast   lidocaine  1 patch Transdermal Q24H   [START ON 03/06/2023] losartan  25 mg Oral q morning   melatonin  3 mg Oral QHS   metoprolol succinate  100 mg Oral Daily   mirabegron ER  50 mg Oral Daily   polyethylene glycol  17 g Oral Daily   QUEtiapine  75 mg Oral QHS   senna-docusate  1 tablet Oral QHS   venlafaxine XR  150 mg Oral QPM   Continuous Infusions:  heparin 1,200 Units/hr (03/04/23 1256)   PRN Meds: acetaminophen **OR** acetaminophen (TYLENOL) oral liquid 160 mg/5 mL **OR** acetaminophen, ondansetron, QUEtiapine, sodium chloride   Vital Signs    Vitals:   03/05/23 0353 03/05/23 0600 03/05/23 0743 03/05/23 0747  BP: 132/86  119/89 119/89  Pulse: 88  84   Resp:   19   Temp: 98.1 F (36.7 C)  98.1 F (36.7 C)   TempSrc: Oral  Oral   SpO2: 99%  97%   Weight:  89.4 kg    Height:        Intake/Output Summary (Last 24 hours) at 03/05/2023 0957 Last data filed at 03/05/2023 1610 Gross per 24 hour  Intake 1390 ml  Output 850 ml  Net 540 ml      03/05/2023    6:00 AM 03/04/2023    5:00 AM 03/03/2023    5:00 AM  Last 3 Weights  Weight (lbs) 197 lb 1.5 oz 195 lb 12.3 oz 195 lb 15.8 oz  Weight (kg) 89.4 kg 88.8 kg 88.9 kg      Telemetry    Not on telemetry, currently in  inpatient rehab- Personally Reviewed  ECG    No new EKGs- Personally Reviewed  Physical Exam   Physical Exam  Constitutional: No distress. He appears chronically ill.  hemodynamically stable  Neck: No JVD present.  Cardiovascular: Normal rate, regular rhythm, S1 normal and S2 normal. Exam reveals no gallop, no S3 and no S4.  No murmur heard. Pulses:      Dorsalis pedis pulses are 2+ on the right side and 2+ on the left side.  Pulmonary/Chest: Effort normal and breath sounds normal. No stridor. He has no wheezes. He has no rales.  Musculoskeletal:        General: No edema.     Cervical back: Neck supple.  Neurological:  Left-sided weakness.  Able to lift left upper and lower extremities against gravity   Skin: Skin is warm.    Labs    High Sensitivity Troponin:   Recent Labs  Lab 03/03/23 1029 03/03/23 1200 03/04/23 1211  TROPONINIHS 53* 41* 83*     Chemistry Recent Labs  Lab 03/02/23 0545 03/04/23 1211 03/05/23 0612  NA 137  135 136  K 4.2 3.8 3.8  CL 103 102 106  CO2 22 21* 21*  GLUCOSE 101* 105* 96  BUN 17 24* 21  CREATININE 1.25* 1.44* 1.25*  CALCIUM 9.6 9.5 9.0  GFRNONAA >60 52* >60  ANIONGAP 12 12 9     Lipids No results for input(s): "CHOL", "TRIG", "HDL", "LABVLDL", "LDLCALC", "CHOLHDL" in the last 168 hours.  Hematology Recent Labs  Lab 03/02/23 0545 2023-03-15 1211 03/05/23 0612  WBC 6.0 3.8* 5.8  RBC 4.19* 4.10* 3.77*  HGB 11.7* 11.4* 10.8*  HCT 36.7* 35.7* 33.0*  MCV 87.6 87.1 87.5  MCH 27.9 27.8 28.6  MCHC 31.9 31.9 32.7  RDW 14.4 14.2 14.3  PLT 194 179 199   Thyroid No results for input(s): "TSH", "FREET4" in the last 168 hours.  BNP Recent Labs  Lab 03/03/23 2011 03-15-23 1211  BNP 323.5* 285.8*    DDimer  Recent Labs  Lab 03/03/23 1814  DDIMER >20.00*     Radiology    CT PE protocol: 15-Mar-2023 1. Large volume pulmonary embolus in both the right and left pulmonary arteries, lobar branches, and segmental branches.  Clot burden is high and affects all lobes. Right ventricular to left ventricular ratio 1.2, abnormally elevated. Positive for acute PE with CT evidence of right heart strain consistent with at least submassive (intermediate risk) PE. The presence of right heart strain has been associated with an increased risk of morbidity and mortality. Please refer to the "Code PE Focused" order set in EPIC. 2. Small right pleural effusion with associated atelectasis. Mild atelectasis in the posterior basal segment left lower lobe. 3. Patchy ground-glass opacity anteriorly in the right upper lobe and peripherally in the right lower lobe, possibly from mild pulmonary hemorrhage. 4. Mild cardiomegaly. 5. Ascending thoracic aortic aneurysm 4.1 cm in diameter. Recommend annual imaging followup by CTA or MRA. This recommendation follows 2010 ACCF/AHA/AATS/ACR/ASA/SCA/SCAI/SIR/STS/SVM Guidelines for the Diagnosis and Management of Patients with Thoracic Aortic Disease. Circulation. 2010; 121: H086-V784. Aortic aneurysm NOS (ICD10-I71.9) 6. Aortic and coronary atherosclerosis.   Aortic Atherosclerosis (ICD10-I70.0) and Emphysema (ICD10-J43.9).  Lower extremity venous duplex: 2023/03/15 RIGHT:  - There is no evidence of deep vein thrombosis in the lower extremity.    - No cystic structure found in the popliteal fossa.    LEFT:  - Findings consistent with acute deep vein thrombosis involving the left popliteal vein.  Findings consistent with acute intramuscular thrombosis involving the left soleal veins, and left intramuscular calf vein   Cardiac Studies   Limited echocardiogram 15-Mar-2023:  1. Left ventricular ejection fraction, by estimation, is 40 to 45%. The  left ventricle has mildly decreased function. The left ventricle  demonstrates global hypokinesis. There is mild left ventricular  hypertrophy.   2. The aortic valve is calcified. There is mild calcification of the  aortic valve.    Comparison(s): No significant change from prior study. Prior images  reviewed side by side.   Patient Profile     73 y.o. male hx of chronic systolic heart failure with improved function, HTN, hypothyroidism, gout, cerebellar stroke, recent right PCA stroke discharged to CIR who is being seen 03/03/2023 for the evaluation of pleuritic chest pain and Shortness of breath at the request of Dr. Natale Lay.   Assessment & Plan    Precordial chest pain Shortness of breath -Cardiology consulted during his stay in inpatient rehab for chest pain and shortness of breath.  His chest pain was predominately pleuritic in nature and and not anginal. -Given  the pleuritic chest pain, shortness of breath, hypoxia and requiring nasal cannula oxygen checked D-dimer levels which were grossly abnormal.  He underwent CT PE study yesterday 03/04/2023 and was noted to have bilateral pulmonary embolism.  Lower extremity venous duplex also notes acute DVT. -He no longer has chest pain. -No additional cardiovascular testing is warranted at this time for precordial pain.  But given his comorbidities recommend outpatient follow-up with his cardiologist at the [facility] status post discharge. -Reemphasized the importance of secondary prevention with focus on improving her modifiable cardiovascular risk factors such as glycemic control, lipid management, blood pressure control, weight loss.  Acute bilateral pulmonary artery embolism. Acute left-sided DVT -Currently on IV heparin drip. -Transition to oral anticoagulation when appropriate. - Has been seen by critical medicine as well there consult note reviewed.   Chronic heart failure with mildly reduced LVEF Patient states that he has had a history of reduced LVEF dating back to 1996 and with medication changes his LVEF has improved over time.  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 Given the bilateral pulmonary embolism  patient did undergo a code PE which included a limited echocardiogram which notes mildly reduced LVEF at 40 to 45%. Continue Toprol-XL 100 mg p.o. daily. Will discontinue amlodipine. Start losartan 25 mg p.o. daily -will start 03/06/2023 is evaluated he received his amlodipine this morning. Check BMP the following morning. Will titrate his GDMT for now.  Encouraged that he follows up with his cardiology provider at the [facility] postdischarge.  Acute CVA Resulting in left-sided weakness. Status post loop recorder implant. Antiplatelets per inpatient rehab/neurology service.  Continue high intensity statin therapy.   For questions or updates, please contact [facility] Please consult [email or web address] for contact info under      Signed, Tessa Lerner, DO, [facility]  [facility]  [address] [place], [place] [address] Pager: [phone] Office: [phone] 03/05/2023, 9:57 AM

## 2023-03-05 NOTE — Progress Notes (Signed)
 PHARMACY - ANTICOAGULATION CONSULT NOTE  Pharmacy Consult for  Heparin IV Indication: acute bilateral PE, large clot burden in setting of acute CVA  No Known Allergies  Patient Measurements: Height: 5\' 9"  (175.3 cm) Weight: 89.4 kg (197 lb 1.5 oz) IBW/kg (Calculated) : 70.7 Heparin Dosing Weight: 88.8 kg  Vital Signs: Temp: 97.8 F (36.6 C) (02/20 1136) Temp Source: Oral (02/20 1136) BP: 132/85 (02/20 1136) Pulse Rate: 82 (02/20 1136)  Labs: Recent Labs    03/03/23 1029 03/03/23 1200 03/04/23 1211 03/04/23 2103 03/05/23 0612  HGB  --   --  11.4*  --  10.8*  HCT  --   --  35.7*  --  33.0*  PLT  --   --  179  --  199  APTT  --   --  33  --   --   LABPROT  --   --  15.0  --   --   INR  --   --  1.2  --   --   HEPARINUNFRC  --   --  <0.10* 0.41 0.37  CREATININE  --   --  1.44*  --  1.25*  TROPONINIHS 53* 41* 83*  --   --     Estimated Creatinine Clearance: 60 mL/min (A) (by C-G formula based on SCr of 1.25 mg/dL (H)).   Medical History: Past Medical History:  Diagnosis Date   Cancer (HCC) 2002   prostate   CHF (congestive heart failure) (HCC)    Gout    Hypertension    Thyroid disease       Assessment:  73 y.o male with  new b/l PE with large clot burden. He had R scattered PCA stroke on MRI 2/2 with small hemorrhagic conversion at right thalamus. Repeat CT on 2/16 showed no evidence of visible hemorrhage. Dr. Lanier Clam reports he discussed plan with Neurologist, Dr. Roda Shutters who is OK for anticoagulation and agrees with heparin IV but recommends per stroke protocol without bolus and with heparin level 0.3-0.5. If able to transition to DOAC later and notes he is OK with loading phase if within 7 days. (See Dr. Warren Danes progress note).  Not on anticoagulation PTA.   Heparin  level = 0.37 remains therapeutic x 2 on heparin 1200 units/hr.   Hgb 10.8, pltc wnl stable.   No active bleeding reported.    Asprin and plavix were discontinued by CIR as discussed with Neuro and  Cardiology.  Significant events:  2/1: S/p TNK 2/1 @ 0545 2/1: S/p thrombectomy 2/8: tx to CIR 2/18: CT angio chest:  bilateral PE , large clot burden and effects all lobes, evidence of right heart strain consistent with at least submassive PE   Goal of Therapy:  Heparin level 0.3-0.5 units/ml Monitor platelets by anticoagulation protocol: Yes   Plan:  Cont IV heparin drip 1200 units/hr  Monitor HL and CBC daily F/u for transition to Eliquis  Noah Delaine, RPh Clinical Pharmacist 03/05/2023 12:54 PM

## 2023-03-05 NOTE — Progress Notes (Signed)
 Physical Therapy Session Note  Patient Details  Name: Edward Rodgers MRN: [id number] Date of Birth: 09-Jul-1950  Today's Date: 03/05/2023 PT Missed Time: 60 Minutes Missed Time Reason: MD hold (Comment) (bed rest) PT treatment time: 4098-1191 PT treatment time: 41 min   Short Term Goals: Week 1:  PT Short Term Goal 1 (Week 1): pt will perfrom sit to stand with LRAD and mod A PT Short Term Goal 1 - Progress (Week 1): Met PT Short Term Goal 2 (Week 1): pt will perform bed to chair transfer with LRAD and mod A PT Short Term Goal 2 - Progress (Week 1): Met PT Short Term Goal 3 (Week 1): Pt will perform bed mobility with LRAD and mod A PT Short Term Goal 3 - Progress (Week 1): Met PT Short Term Goal 4 (Week 1): pt will ambulate 50 feet with LRAD and +2 A PT Short Term Goal 4 - Progress (Week 1): Met  Skilled Therapeutic Interventions/Progress Updates:      Treatment Session 1  Pt missed 60 minutes 2/2 MD hold/bed rest. Followed up with nurse/MD/and PA. Pt will be cleared to resume bed level only activity at 1300 today.  Treatment Session 2  Pt seen for bed level therapy d/t recent PE. Cleared for bed level therapy as of 1300 after 24 hrs on blood thinners. No complaint of pain.   Pt brother in room. Pt reports plan to walk to bathroom. Education provided to pt on justification for bed level activity at this time 2/2 PE and L LE DVT.   Pt performed rolling B with min A, while pt placed bed pan, however pt does not intitiate BM or urination despite encouragement.    Pt performed the following exercises to promote LE strength/ROM/endurance/contracture prevention  1x10 ankle pumps B 1x10 quad sets B 1x10 L UE bicep curls with resistance from therapist hand with therpaist  1x10 L UE protraction/retraction 1x4 cross body reaching across body to grasp cone     Cues provided throughout for attention to L UE/L LE, and sequencing intermittently.      Pt in bed at end of session with all  needs within reach and bed alarm on.  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 Restrictions Weight Bearing Restrictions Per Provider Order: No   Therapy/Group: Individual Therapy  [facility] [place], [place], DPT  03/05/2023, 7:31 AM

## 2023-03-05 NOTE — Progress Notes (Signed)
 Occupational Therapy Session Note  Patient Details  Name: Edward Rodgers MRN: [id number] Date of Birth: 1950-11-21  Today's Date: 03/05/2023 OT Missed Time: 45 Minutes Missed Time Reason: MD hold (comment)   Short Term Goals: Week 2:  OT Short Term Goal 1 (Week 2): Pt with threading LB garments with supervision, including hemi-body. OT Short Term Goal 2 (Week 2): Pt will integrate LUE in functional tasks with Min cuing. OT Short Term Goal 3 (Week 2): Pt will maintain midline orientation in static standing with Min A for >2 mins.   Skilled Therapeutic Interventions/Progress Updates:    Pt received bed level at time of session with IV in place, on bed pan as well. Extensive discussion with nursing - who states pt is on bed rest and only able to be moved to roll at this time, no therapy this date 2/2 clots. Checked in orders and MD order still present for bed rest at this time. Missed 45 mis of OT.  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 Restrictions Weight Bearing Restrictions Per Provider Order: No    Therapy/Group: Individual Therapy  Erasmo Score 03/05/2023, 11:45 AM

## 2023-03-05 NOTE — Progress Notes (Signed)
 Physical Therapy Session Note  Patient Details  Name: Edward Rodgers MRN: [id number] Date of Birth: Aug 02, 1950  Today's Date: 03/05/2023 PT Individual Time: 1300-1415 PT Individual Time Calculation (min): 75 min   Short Term Goals: Week 2:  PT Short Term Goal 1 (Week 2): pt will perform bed mobility with CGA or less PT Short Term Goal 2 (Week 2): pt will ambulate 100 feet with RW and mod A or less PT Short Term Goal 3 (Week 2): pt will perform car transfer with mod A or less with LRAD  Skilled Therapeutic Interventions/Progress Updates:    Pt seen for bed level therapy d/t recent PE. Cleared as of 1300 after 24 hrs on blood thinners. No complaint of pain.   Pt performed the following exercises to promote LE strength and endurance:  -alternating superset of SLR and supine abduction 4 x 10 BIL -Super set of  heel slides and BKFO against red band (cues required to maintain attention to LLE)  -bicep curl against Red T band ancored at feet, 4 x 6  -tricep extension against red t band on bed rail, 4  x 8   Cues throughout for  attention to LUE/LLE and for sequencing at times with theraband.  Nsg in/out during session to collect UA/bladder scan.   Pt remained in bed at end of session, was left with all needs in reach and alarm active.   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 Restrictions Weight Bearing Restrictions Per Provider Order: No General: PT Amount of Missed Time (min): 60 Minutes PT Missed Treatment Reason: MD hold (Comment) (bed rest)     Therapy/Group: Individual Therapy  [name] 03/05/2023, 1:16 PM

## 2023-03-05 NOTE — Progress Notes (Signed)
 PROGRESS NOTE   Subjective/Complaints:  Patient denies chest pain today.  No new concerns or complaints elicited.  ROS: as per HPI. Denies fever, HA, abd pain, N/V/D/C, or any other complaints at this time.   +CP-resolved + Shortness of breath- he reports he has had mild for past few weeks, improved today  Objective:   VAS [place] LOWER EXTREMITY VENOUS (DVT) Result Date: 03/04/2023  Lower Venous DVT Study Patient Name:  Edward Rodgers  Date of Exam:   03/04/2023 Medical Rec #: [id number]       Accession #:    [id number] Date of Birth: [birth year]       Patient Gender: M Patient Age:  73 years Exam Location:  [facility] Procedure:      VAS [place] LOWER EXTREMITY VENOUS (DVT) Referring Phys: Mertha Baars --------------------------------------------------------------------------------  Indications: Pulmonary embolism, and stroke.  Comparison Study: Previous study on 2.4.2025. Performing Technologist: Fernande Bras  Examination Guidelines: A complete evaluation includes B-mode imaging, spectral Doppler, color Doppler, and power Doppler as needed of all accessible portions of each vessel. Bilateral testing is considered an integral part of a complete examination. Limited examinations for reoccurring indications may be performed as noted. The reflux portion of the exam is performed with the patient in reverse Trendelenburg.  +---------+---------------+---------+-----------+----------+--------------+ RIGHT    CompressibilityPhasicitySpontaneityPropertiesThrombus Aging +---------+---------------+---------+-----------+----------+--------------+ CFV      Full           Yes      Yes                                 +---------+---------------+---------+-----------+----------+--------------+ SFJ      Full           Yes      Yes                                 +---------+---------------+---------+-----------+----------+--------------+  FV Prox  Full                                                        +---------+---------------+---------+-----------+----------+--------------+ FV Mid   Full                                                        +---------+---------------+---------+-----------+----------+--------------+ FV DistalFull                                                        +---------+---------------+---------+-----------+----------+--------------+ PFV      Full                                                        +---------+---------------+---------+-----------+----------+--------------+  POP      Full           Yes      Yes                                 +---------+---------------+---------+-----------+----------+--------------+ PTV      Full                                                        +---------+---------------+---------+-----------+----------+--------------+ PERO     Full                                                        +---------+---------------+---------+-----------+----------+--------------+   +---------+---------------+---------+-----------+----------+--------------+ LEFT     CompressibilityPhasicitySpontaneityPropertiesThrombus Aging +---------+---------------+---------+-----------+----------+--------------+ CFV      Full           Yes      Yes                                 +---------+---------------+---------+-----------+----------+--------------+ SFJ      Full           Yes      Yes                                 +---------+---------------+---------+-----------+----------+--------------+ FV Prox  Full                                                        +---------+---------------+---------+-----------+----------+--------------+ FV Mid   Full                                                        +---------+---------------+---------+-----------+----------+--------------+ FV DistalFull                                                         +---------+---------------+---------+-----------+----------+--------------+ PFV      Full                                                        +---------+---------------+---------+-----------+----------+--------------+ POP      None           No       No                                  +---------+---------------+---------+-----------+----------+--------------+  PTV      Full                                                        +---------+---------------+---------+-----------+----------+--------------+ PERO     Full                                                        +---------+---------------+---------+-----------+----------+--------------+ Soleal   None           No       No                                  +---------+---------------+---------+-----------+----------+--------------+ IM       None           No       No                                  +---------+---------------+---------+-----------+----------+--------------+     Summary: RIGHT: - There is no evidence of deep vein thrombosis in the lower extremity.  - No cystic structure found in the popliteal fossa.  LEFT: - Findings consistent with acute deep vein thrombosis involving the left popliteal vein. Findings consistent with acute intramuscular thrombosis involving the left soleal veins, and left intramuscular calf vein. - No cystic structure found in the popliteal fossa.  *See table(s) above for measurements and observations. Electronically signed by Coral Else MD on 03/04/2023 at 8:14:36 PM.    Final    ECHOCARDIOGRAM LIMITED Result Date: 03/04/2023    ECHOCARDIOGRAM LIMITED REPORT   Patient Name:   Edward Rodgers Date of Exam: 03/04/2023 Medical Rec #:  [id number]      Height:       69.0 in Accession #:    [id number]     Weight:       195.8 lb Date of Birth:  05-08-1950      BSA:          2.047 m Patient Age:    73 years       BP:           140/81 mmHg Patient Gender: M               HR:           88 bpm. Exam Location:  Inpatient Procedure: Limited Echo, Limited Color Doppler and Intracardiac Opacification            Agent (Both Spectral and Color Flow Doppler were utilized during            procedure). Indications:    Chest Pain R07.9  History:        Patient has prior history of Echocardiogram examinations, most                 recent 02/15/2023. CHF, Stroke, Signs/Symptoms:Shortness of                 Breath; Risk Factors:Hypertension.  Sonographer:    Lucendia Herrlich RCS Referring Phys: [id number] ANGELA NICOLE  [place] IMPRESSIONS  1. Left ventricular ejection fraction, by estimation, is 40 to 45%. The left ventricle has mildly decreased function. The left ventricle demonstrates global hypokinesis. There is mild left ventricular hypertrophy.  2. The aortic valve is calcified. There is mild calcification of the aortic valve. Comparison(s): No significant change from prior study. Prior images reviewed side by side. FINDINGS  Left Ventricle: Left ventricular ejection fraction, by estimation, is 40 to 45%. The left ventricle has mildly decreased function. The left ventricle demonstrates global hypokinesis. Definity contrast agent was given IV to delineate the left ventricular  endocardial borders. There is mild left ventricular hypertrophy. Aortic Valve: The aortic valve is calcified. There is mild calcification of the aortic valve. LEFT VENTRICLE PLAX 2D LVIDd:         3.70 cm      Diastology LVIDs:         3.20 cm      LV e' medial:    6.96 cm/s LV PW:         1.20 cm      LV E/e' medial:  5.1 LV IVS:        0.80 cm      LV e' lateral:   8.92 cm/s LVOT diam:     2.10 cm      LV E/e' lateral: 4.0 LVOT Area:     3.46 cm  LV Volumes (MOD) LV vol d, MOD A2C: 132.0 ml LV vol d, MOD A4C: 137.0 ml LV vol s, MOD A2C: 101.0 ml LV vol s, MOD A4C: 68.2 ml LV SV MOD A2C:     31.0 ml LV SV MOD A4C:     137.0 ml LV SV MOD BP:      46.3 ml RIGHT VENTRICLE             IVC RV S prime:     11.00 cm/s  IVC  diam: 1.40 cm TAPSE (M-mode): 1.9 cm LEFT ATRIUM         Index LA diam:    2.80 cm 1.37 cm/m   AORTA Ao Root diam: 3.90 cm Ao Asc diam:  3.90 cm MITRAL VALVE MV Area (PHT): 4.89 cm    SHUNTS MV Decel Time: 155 msec    Systemic Diam: 2.10 cm MV E velocity: 35.80 cm/s MV A velocity: 74.40 cm/s MV E/A ratio:  0.48 Donato Schultz MD Electronically signed by Donato Schultz MD Signature Date/Time: 03/04/2023/2:11:12 PM    Final    CT Angio Chest Pulmonary Embolism (PE) W or WO Contrast Addendum Date: 03/04/2023 ADDENDUM REPORT: 03/04/2023 11:36 ADDENDUM: The original report was by Dr. Gaylyn Rong. The following addendum is by Dr. Gaylyn Rong: Critical Value/emergent results were called by telephone at the time of interpretation on 03/04/2023 at 11:15 am to provider [facility] , who verbally acknowledged these results. Electronically Signed   By: Gaylyn Rong M.D.   On: 03/04/2023 11:36   Result Date: 03/04/2023 CLINICAL DATA:  Acute stroke, hypoxia. EXAM: CT ANGIOGRAPHY CHEST WITH CONTRAST TECHNIQUE: Multidetector CT imaging of the chest was performed using the standard protocol during bolus administration of intravenous contrast. Multiplanar CT image reconstructions and MIPs were obtained to evaluate the vascular anatomy. RADIATION DOSE REDUCTION: This exam was performed according to the departmental dose-optimization program which includes automated exposure control, adjustment of the mA and/or kV according to patient size and/or use of iterative reconstruction technique. CONTRAST:  75mL OMNIPAQUE IOHEXOL 350 MG/ML SOLN COMPARISON:  Chest radiograph 03/03/2023 FINDINGS: Cardiovascular:  Large volume pulmonary embolus in both the right and left pulmonary arteries, lobar branches, and segmental branches. Clot burden is high and affects all lobes. Right ventricular to left ventricular ratio 1.2, abnormally elevated. Coronary, aortic arch, and branch vessel atherosclerotic vascular disease. Ascending  thoracic aortic aneurysm 4.1 cm in diameter on image 64 series 5. Mild cardiomegaly. Mediastinum/Nodes: Unremarkable Lungs/Pleura: Small right pleural effusion with associated atelectasis. Mild atelectasis in the posterior basal segment left lower lobe. Centrilobular emphysema. Patchy ground-glass opacity anteriorly in the right upper lobe and peripherally in the right lower lobe, possibly from mild pulmonary hemorrhage. Upper Abdomen: Unremarkable Musculoskeletal: Thoracic spondylosis. Review of the MIP images confirms the above findings. IMPRESSION: 1. Large volume pulmonary embolus in both the right and left pulmonary arteries, lobar branches, and segmental branches. Clot burden is high and affects all lobes. Right ventricular to left ventricular ratio 1.2, abnormally elevated. Positive for acute PE with CT evidence of right heart strain consistent with at least submassive (intermediate risk) PE. The presence of right heart strain has been associated with an increased risk of morbidity and mortality. Please refer to the "Code PE Focused" order set in EPIC. 2. Small right pleural effusion with associated atelectasis. Mild atelectasis in the posterior basal segment left lower lobe. 3. Patchy ground-glass opacity anteriorly in the right upper lobe and peripherally in the right lower lobe, possibly from mild pulmonary hemorrhage. 4. Mild cardiomegaly. 5. Ascending thoracic aortic aneurysm 4.1 cm in diameter. Recommend annual imaging followup by CTA or MRA. This recommendation follows 2010 ACCF/AHA/AATS/ACR/ASA/SCA/SCAI/SIR/STS/SVM Guidelines for the Diagnosis and Management of Patients with Thoracic Aortic Disease. Circulation. 2010; 121: W098-J191. Aortic aneurysm NOS (ICD10-I71.9) 6. Aortic and coronary atherosclerosis. Aortic Atherosclerosis (ICD10-I70.0) and Emphysema (ICD10-J43.9). Radiology assistant personnel have been notified to put me in telephone contact with the referring physician or the referring  physician's clinical representative in order to discuss these findings. Once this communication is established I will issue an addendum to this report for documentation purposes. Electronically Signed: By: Gaylyn Rong M.D. On: 03/04/2023 11:12   Recent Labs    03/04/23 1211 03/05/23 0612  WBC 3.8* 5.8  HGB 11.4* 10.8*  HCT 35.7* 33.0*  PLT 179 199    Recent Labs    03/04/23 1211 03/05/23 0612  NA 135 136  K 3.8 3.8  CL 102 106  CO2 21* 21*  GLUCOSE 105* 96  BUN 24* 21  CREATININE 1.44* 1.25*  CALCIUM 9.5 9.0        Intake/Output Summary (Last 24 hours) at 03/05/2023 1149 Last data filed at 03/05/2023 0742 Gross per 24 hour  Intake 1390 ml  Output 850 ml  Net 540 ml         Physical Exam: Vital Signs Blood pressure 132/85, pulse 82, temperature 97.8 F (36.6 C), temperature source Oral, resp. rate 18, height 5\' 9"  (1.753 m), weight 89.4 kg, SpO2 95%.  Constitutional: NAD, Laying in bed HENT:     Head: Normocephalic and atraumatic.     Nose: Nose normal.     Mouth/Throat:     Mouth: Mucous membranes are moist.   Eyes:     Extraocular Movements: Extraocular movements intact.     Conjunctiva/sclera: Conjunctivae normal.     Pupils: Pupils are equal, round, and reactive to light.  Cardiovascular:     Rate and Rhythm: Normal rate and regular rhythm.  Pulmonary:     Effort: Pulmonary effort is normal. No respiratory distress. + [place] O2 2L  Breath sounds: No stridor. No wheezing or rhonchi.  Abdominal:     General: Bowel sounds are normal. There is no distension.     Palpations: Abdomen is soft.     Tenderness: There is no abdominal tenderness.  Musculoskeletal:        General: Swelling (trace LUE) present. Improving    Cervical back: Normal range of motion.      No pedal edema appreciated  Skin:    General: Skin is warm and dry.     Findings: No bruising or erythema.  Psychiatric:     Comments: Pt pleasant and cooperative  Neuro: alert,  slightly dysarthric which is baseline, occasional staring off but no confusion and no postictal period.   PRIOR EXAMS: Neurological:     Mental Status: He is alert.     Comments: Alert and oriented x 4.  Slightly dysarthric speech.  Fair insight and awareness.  Language overall appears to be intact.  Left central 7.  Decreased sensation to light touch in left upper extremity and left lower extremity.  Moving all 4 extremities in bed Mildly increased tone R hand.    Assessment/Plan: 1. Functional deficits which require 3+ hours per day of interdisciplinary therapy in a comprehensive inpatient rehab setting. Physiatrist is providing close team supervision and 24 hour management of active medical problems listed below. Physiatrist and rehab team continue to assess barriers to discharge/monitor patient progress toward functional and medical goals  Care Tool:  Bathing    Body parts bathed by patient: Left arm, Chest, Abdomen, Front perineal area, Right upper leg, Left upper leg, Right lower leg, Face   Body parts bathed by helper: Right arm, Buttocks, Left lower leg     Bathing assist Assist Level: Moderate Assistance - Patient 50 - 74%     Upper Body Dressing/Undressing Upper body dressing        Upper body assist Assist Level: Moderate Assistance - Patient 50 - 74%    Lower Body Dressing/Undressing Lower body dressing            Lower body assist Assist for lower body dressing: Maximal Assistance - Patient 25 - 49%     Toileting Toileting    Toileting assist Assist for toileting: Maximal Assistance - Patient 25 - 49%     Transfers Chair/bed transfer  Transfers assist     Chair/bed transfer assist level: Moderate Assistance - Patient 50 - 74% (squat pivot/stand pivot)     Locomotion Ambulation   Ambulation assist   Ambulation activity did not occur: Safety/medical concerns  Assist level: 2 helpers (+2 for WC follow) Assistive device: Walker-rolling Max  distance: 50 feet   Walk 10 feet activity   Assist  Walk 10 feet activity did not occur: Safety/medical concerns  Assist level: 2 helpers Assistive device: Walker-rolling   Walk 50 feet activity   Assist Walk 50 feet with 2 turns activity did not occur: Safety/medical concerns  Assist level: 2 helpers Assistive device: Walker-rolling    Walk 150 feet activity   Assist Walk 150 feet activity did not occur: Safety/medical concerns         Walk 10 feet on uneven surface  activity   Assist Walk 10 feet on uneven surfaces activity did not occur: Safety/medical concerns         Wheelchair     Assist Is the patient using a wheelchair?: Yes Type of Wheelchair: Manual    Wheelchair assist level: Minimal Assistance - Patient > 75% Max  wheelchair distance: 50    Wheelchair 50 feet with 2 turns activity    Assist        Assist Level: Minimal Assistance - Patient > 75%   Wheelchair 150 feet activity     Assist      Assist Level: Maximal Assistance - Patient 25 - 49%   Blood pressure 132/85, pulse 82, temperature 97.8 F (36.6 C), temperature source Oral, resp. rate 18, height 5\' 9"  (1.753 m), weight 89.4 kg, SpO2 95%.  Medical Problem List and Plan: 1. 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  Await plan for loop recorder             -patient may shower -ELOS/Goals: 21-25 days, min assist with PT, OT and supervision with SLP -Continue CIR -Expected discharge 03/14/2023 -Bed level activities today  2.  Antithrombotics: -DVT/anticoagulation:  Pharmaceutical: Heparin 5000U q8h -antiplatelet therapy: Aspirin 81 mg daily and Plavix 75 mg day x 3 weeks then Plavix alone -2/20 DC aspirin and Plavix as on heparin, discussed with neurology and cardiology 3. Pain Management: Lidoderm patch as directed, Tylenol as needed 4. Mood/Sleep/sundowning: Effexor 150 mg every  evening, provide emotional support             -antipsychotic agents: Seroquel 50 mg nighty at 2030, have back up dose             -keep sleep chart 5. Neuropsych/cognition: This patient is capable of making decisions on his own behalf. 6. Skin/Wound Care: Routine skin checks 7. Fluids/Electrolytes/Nutrition: Routine in and outs with follow-up chemistries, continue vitamins/supplements 8.  Hypothyroidism.  Synthroid 76mcg/day 9.  Hypertension. Norvasc 10 mg daily, Toprol-Xl 100 mg daily, Aldactone 25 mg daily.             -bp trending up. Likely will need additions to regimen  -02/22/23 BPs 140s/90s, not terrible, monitor trend with increase mobility  2/10 BP controlled, decrease spironolactone due to elevated CR/BUN -02/28/23 BP stable but creeping up, continue to monitor for now but may need to increase meds back -2/20 BP controlled, continue to monitor   Vitals:   03/03/23 2300 03/04/23 0359 03/04/23 0750 03/04/23 1113  BP: 121/73 131/86 (!) 140/81 128/86   03/04/23 1257 03/04/23 1700 03/04/23 2004 03/05/23 0000  BP: 119/79 (!) 132/99 (!) 136/96 115/89   03/05/23 0353 03/05/23 0743 03/05/23 0747 03/05/23 1136  BP: 132/86 119/89 119/89 132/85    10.H/O prostate cancer. Myrbetriq 50 mg daily. 11.Hyperlipidemia. Lipitor 80mg  daily 12.Constipation.             -miralax daily, SenokotS 1 tab daily -02/22/23 LBM 2days ago, monitor today, if no BM tomorrow then may want to increase regimen -2/19 LBM today, having fairly regular bowel movements 13.H/O gout. Zyloprim 300mg  daily 14.  AKI versus CKD  -Encourage oral fluid intake, decrease spironolactone to 12.5 mg  -2/11 recheck BMP today  Addendum- increased Cr, stop spironolactone, start IVF NS 8ml/HR  -2/12 Cr slightly improved, continue IVF, encourage oral fluids  -2/13 recheck BMP- CR down to 1.2  -2/14 DC IVF, discussed oral hydration  2/17 BUN/CR stable 17/1.25  -2/18 continue to encourage oral hydration  -2/20 BUN and creatinine  improved to 21/1.25, got IV fluids last night 67ml/hr  15.  History of CHF.  Last EF 55%, mild diastolic dysfunction  -Continue metoprolol, decrease spironolactone to 12.5 mg as above -2/14 Wts stable, has not appear to show signs of  fluid overload, continue to monitor -02/28/23 wt up but doubt accuracy, monitor, no overload on exam -2/19-20 weight stable, does not appear to show signs of fluid overload, EF 40 to 45% on echo. -Cardiology has been following, they discontinued amlodipine and started losartan 25 mg daily-beginning tomorrow  -Follow-up with [facility]ology at the [facility] postdischarge  Filed Weights   03/03/23 0500 03/04/23 0500 03/05/23 0600  Weight: 88.9 kg 88.8 kg 89.4 kg    16.  Severe cervical stenosis: Consider neurosurgery outpatient follow-up 17. Insomnia -Patient has Seroquel 50 mg scheduled and 25 mg as needed.  Will also add in 3 mg melatonin at bedtime  -2/11 increase Seroquel to 75 mg nightly  -2/13 asked nursing to complete sleep-wake chart  -2/14 reports sleeping better, continue current regimen for now and monitor  -2/20 will decrease Seroquel back to 50 mg nightly and 25 mg at bedtime  18.  Bleeding from his penis  -UA neg,HGB was stable on 2/11 -Denies additional occurrences.  During team meeting was noted this happened intermittently during acute hospital stay  19. Nasal congestion: Flonase 1 spray daily  20. Staring episodes/vision changes 03/01/23 -03/01/23 having staring episodes and ?chronic vision changes/hemianopsia? Spoke with Dr. Derry Lory of neuro, will get 24hr EEG and head CT, no code stroke at this point; they will see pt, appreciate their assistance.  -2/17 overnight EEG neg, CT head without acute changes  21. Chest pain  -EKG, Trop, CXR ordered  -Cardiology following appreciate assistance.  D-dimer positive, getting CT PE study to rule out PE.  If positive will need to discuss with neurology regarding anticoagulation.  He is now on 2 L nasal cannula  oxygen  -2/20 patient reports his chest pain is resolved  22. Pulmonary embolism / DVT left popliteal vein -2/19 discussed with neurology and cardiology. Heparin grip started, wife updated by phone. Critical care medicine consulted 2/20 seen by critical care, continue heparin eventual transition to DOAC, if hemodynamic changes contact PCCM again  LOS: 12 days A FACE TO FACE EVALUATION WAS PERFORMED  Fanny Dance 03/05/2023, 11:49 AM

## 2023-03-05 NOTE — Progress Notes (Signed)
 Patient placed on CPOX overnight.  Heparin gtt continues.  2L O2 [place]. Marland Kitchen9@75  mL/Hr.  VSS.  Denies CP or SOB.  Restless at times overnight.  Awaiting morning PTT.

## 2023-03-06 ENCOUNTER — Other Ambulatory Visit (HOSPITAL_COMMUNITY): Payer: Self-pay

## 2023-03-06 LAB — URINALYSIS, ROUTINE W REFLEX MICROSCOPIC
Bilirubin Urine: NEGATIVE
Glucose, UA: NEGATIVE mg/dL
Hgb urine dipstick: NEGATIVE
Ketones, ur: 5 mg/dL — AB
Leukocytes,Ua: NEGATIVE
Nitrite: NEGATIVE
Protein, ur: NEGATIVE mg/dL
Specific Gravity, Urine: 1.019 (ref 1.005–1.030)
pH: 5 (ref 5.0–8.0)

## 2023-03-06 LAB — CBC
HCT: 35.2 % — ABNORMAL LOW (ref 39.0–52.0)
Hemoglobin: 11.5 g/dL — ABNORMAL LOW (ref 13.0–17.0)
MCH: 28.5 pg (ref 26.0–34.0)
MCHC: 32.7 g/dL (ref 30.0–36.0)
MCV: 87.1 fL (ref 80.0–100.0)
Platelets: 206 10*3/uL (ref 150–400)
RBC: 4.04 MIL/uL — ABNORMAL LOW (ref 4.22–5.81)
RDW: 14.1 % (ref 11.5–15.5)
WBC: 4.5 10*3/uL (ref 4.0–10.5)
nRBC: 0 % (ref 0.0–0.2)

## 2023-03-06 LAB — HEPARIN LEVEL (UNFRACTIONATED): Heparin Unfractionated: 0.43 [IU]/mL (ref 0.30–0.70)

## 2023-03-06 MED ORDER — QUETIAPINE FUMARATE 25 MG PO TABS
25.0000 mg | ORAL_TABLET | Freq: Every day | ORAL | Status: DC
  Administered 2023-03-06 – 2023-03-09 (×4): 25 mg via ORAL
  Filled 2023-03-06 (×4): qty 1

## 2023-03-06 MED ORDER — APIXABAN 5 MG PO TABS
10.0000 mg | ORAL_TABLET | Freq: Two times a day (BID) | ORAL | Status: AC
  Administered 2023-03-06 – 2023-03-12 (×14): 10 mg via ORAL
  Filled 2023-03-06 (×14): qty 2

## 2023-03-06 MED ORDER — APIXABAN 5 MG PO TABS
5.0000 mg | ORAL_TABLET | Freq: Two times a day (BID) | ORAL | Status: DC
  Administered 2023-03-13 – 2023-03-28 (×31): 5 mg via ORAL
  Filled 2023-03-06 (×31): qty 1

## 2023-03-06 NOTE — Progress Notes (Signed)
 PHARMACY - ANTICOAGULATION CONSULT NOTE  Pharmacy Consult for  Heparin IV Indication: acute bilateral PE, large clot burden in setting of acute CVA  No Known Allergies  Patient Measurements: Height: 5\' 9"  (175.3 cm) Weight: 89.4 kg (197 lb 1.5 oz) IBW/kg (Calculated) : 70.7 Heparin Dosing Weight: 88.8 kg  Vital Signs: Temp: 97.9 F (36.6 C) (02/21 0613) Temp Source: Oral (02/21 0613) BP: 134/87 (02/21 0613) Pulse Rate: 74 (02/21 0613)  Labs: Recent Labs    03/03/23 1029 03/03/23 1200 03/04/23 1211 03/04/23 1211 03/04/23 2103 03/05/23 0612 03/06/23 0550  HGB  --   --  11.4*   < >  --  10.8* 11.5*  HCT  --   --  35.7*  --   --  33.0* 35.2*  PLT  --   --  179  --   --  199 206  APTT  --   --  33  --   --   --   --   LABPROT  --   --  15.0  --   --   --   --   INR  --   --  1.2  --   --   --   --   HEPARINUNFRC  --   --  <0.10*   < > 0.41 0.37 0.43  CREATININE  --   --  1.44*  --   --  1.25*  --   TROPONINIHS 53* 41* 83*  --   --   --   --    < > = values in this interval not displayed.    Estimated Creatinine Clearance: 60 mL/min (A) (by C-G formula based on SCr of 1.25 mg/dL (H)).   Medical History: Past Medical History:  Diagnosis Date   Cancer (HCC) 2002   prostate   CHF (congestive heart failure) (HCC)    Gout    Hypertension    Thyroid disease       Assessment:  73 y.o male with  new b/l PE with large clot burden. He had R scattered PCA stroke on MRI 2/2 with small hemorrhagic conversion at right thalamus. Repeat CT on 2/16 showed no evidence of visible hemorrhage. Dr. Lanier Clam reports he discussed plan with Neurologist, Dr. Roda Shutters who is OK for anticoagulation and agrees with heparin IV but recommends per stroke protocol without bolus and with heparin level 0.3-0.5. If able to transition to DOAC later and notes he is OK with loading phase if within 7 days. (See Dr. Warren Danes progress note).  Not on anticoagulation PTA.   Significant events:  2/1: S/p TNK  2/1 @ 0545 2/1: S/p thrombectomy 2/8: tx to CIR 2/18: CT angio chest:  bilateral PE , large clot burden and effects all lobes, evidence of right heart strain consistent with at least submassive PE  2/21 AM update: HL 0.43 Hgb 11.5- stable PLT 206K- stable No signs of bleeding or issues with the infusion per nursing  Goal of Therapy:  Heparin level 0.3-0.5 units/ml Monitor platelets by anticoagulation protocol: Yes   Plan:  Cont IV heparin drip 1200 units/hr  Monitor HL and CBC daily F/u for transition to Eliquis   Adonna Horsley BS, PharmD, BCPS Clinical Pharmacist 03/06/2023 7:18 AM  Contact: [phone] after 3 PM  "Be curious, not judgmental..." -Debbora Dus

## 2023-03-06 NOTE — Progress Notes (Signed)
 Occupational Therapy Session Note  Patient Details  Name: Edward Rodgers MRN: [id number] Date of Birth: 1950-01-19  Today's Date: 03/06/2023 OT Individual Time: 4098-1191 OT Individual Time Calculation (min): 41 min    Short Term Goals: Week 2:  OT Short Term Goal 1 (Week 2): Pt with threading LB garments with supervision, including hemi-body. OT Short Term Goal 2 (Week 2): Pt will integrate LUE in functional tasks with Min cuing. OT Short Term Goal 3 (Week 2): Pt will maintain midline orientation in static standing with Min A for >2 mins.  Skilled Therapeutic Interventions/Progress Updates:     AM SessionL Pt received reclined in bed lightly sleeping waking upon OT arrival. Pt presenting to be tired, however in good spirits receptive to skilled OT session reporting 0/10 pain- OT offering intermittent rest breaks, repositioning, and therapeutic support to optimize participation in therapy session. Pt noted to still be on Heparin drip therefore continues to be on bed level activity only. Focus this session BADL retraining, edema management, and Pt education. Pt requesting to put on clean pants at beginning of session. Assisted Pt wit transitioning to long sitting position to weave B LEs into pants and to work on trunk control skills for improved sitting balance and midline orientation. Pt required mod A to lift trunk with R UE positioned on grab bar and mod A to maintain long sitting position while using R UE to weave feet into pants. Flattened bed and assisted Pt to supine position with Pt then able to bed B LEs with min A for L LE and bridge hips following verbal cues to bring pants to waist with assistance required to fully adjust pants over L hip. Pt very inquisitive about d/c plans, obtaining DME, and follow-up recommendations. Provided education on POC and therapy schedule within the hospital. Also educated on follow-up recommendation for OP vs HH therapies to continue to advance his  independence and decrease overall bourdon of care. Educated on plans for family to come in for education during hospital stay to ensure safety during d/c home, plan for education to be provided on DME recommendations and how to complete transfers using DME, and need for 24/7 supervision at d/c to ensure safety with Pt receptive to all education provided. Pt L UE noted have mod edema in digits, hand, and forearm. Educated Pt on positioning in bed and wc to support edema management and technique for completing edema massage on self with Pt receptive to education. OT completed light progressive edema massage on L UE and applied k-tape to dorsal fingers, hand, forearm for edema management with education provided on purpose and Pt receptive to k-tape.Pt extremely tired at end of session and falling asleep following education.  Pt was left resting in bed with call bell in reach, bed alarm on, and all needs met.    PM Session: Pt received reclined in bed presenting to be in good spirits receptive to skilled OT session reporting 0/10 pain- OT offering intermittent rest breaks, repositioning, and therapeutic support to optimize participation in therapy session. Pt cleared for OOB therapies prior to OT session. Pt fatigued, however motivated to participate in session. Focused this session on L hemibody NMRE, sitting balance, and midline orientation. Pt transitioned to EOB with HOB elevated with light mod A +step by stpe verbal and tactile cues required to bring B LEs off EOB and to utilize bed rail to lift trunk. Pt with posterior and L lena in sitting, however able to follow verbal cues to correct  midline orientation maintaining static sitting balance with CGA. Engaged Pt in series of functional reaching activities to challenge midline with Pt tasked with lateral, anterior, and inferior reaching with crossing midline and L side visual scanning incorporated into task with Pt able to complete with min A and consistent verbal  and tactile cues required for midline orientation and trunk positioning. Incorporated B UEs into "tug of war" activity seated EOB to work on reactionary balance with max verbal cues required for L UE attention and integration into task and min A for sitting balance. Pt then completed bicep flexion/extension and scapular protraction/retraction with OT providing gentle resistance for increase proprioceptive feedback with Pt requiring max verbal/tactile cues required to avoid compensatory shoulder elevation during task. Worked on anterior weight shifting and pushing though B UEs to offload buttocks as a preparatory standing task with Pt able to complete with min A overall and max verbal/tactile cues. Graded up task to have Pt complete lateral scoots to [facility] with Pt able to complete with light mod A. EOB > supine mod A +max verbal cues for technique. Pt was left resting in bed with call bell in reach, bed alarm on, and all needs met.    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 Restrictions Weight Bearing Restrictions Per Provider Order: No   Therapy/Group: Individual Therapy  Clide Deutscher 03/06/2023, 8:01 AM

## 2023-03-06 NOTE — Progress Notes (Signed)
 PHARMACY - ANTICOAGULATION CONSULT NOTE  Pharmacy Consult for  transition from Heparin to apixaban Indication: acute bilateral PE, large clot burden in setting of acute CVA  No Known Allergies  Patient Measurements: Height: 5\' 9"  (175.3 cm) Weight: 89.9 kg (198 lb 3.1 oz) IBW/kg (Calculated) : 70.7 Heparin Dosing Weight: 88.8 kg  Vital Signs: Temp: 97.9 F (36.6 C) (02/21 0613) Temp Source: Oral (02/21 0613) BP: 134/87 (02/21 0823) Pulse Rate: 74 (02/21 0823)  Labs: Recent Labs    03/04/23 1211 03/04/23 2103 03/05/23 0612 03/06/23 0550  HGB 11.4*  --  10.8* 11.5*  HCT 35.7*  --  33.0* 35.2*  PLT 179  --  199 206  APTT 33  --   --   --   LABPROT 15.0  --   --   --   INR 1.2  --   --   --   HEPARINUNFRC <0.10* 0.41 0.37 0.43  CREATININE 1.44*  --  1.25*  --   TROPONINIHS 83*  --   --   --     Estimated Creatinine Clearance: 60.1 mL/min (A) (by C-G formula based on SCr of 1.25 mg/dL (H)).   Medical History: Past Medical History:  Diagnosis Date   Cancer (HCC) 2002   prostate   CHF (congestive heart failure) (HCC)    Gout    Hypertension    Thyroid disease       Assessment:  73 y.o male with  new b/l PE with large clot burden. He had R scattered PCA stroke on MRI 2/2 with small hemorrhagic conversion at right thalamus. Repeat CT on 2/16 showed no evidence of visible hemorrhage. Dr. Lanier Clam reports he discussed plan with Neurologist, Dr. Roda Shutters who is OK for anticoagulation and agrees with heparin IV but recommends per stroke protocol without bolus and with heparin level 0.3-0.5.  Order received to start apixaban,. Dr. Roda Shutters is OK with starting with load phase if within the initial 7 days of heparin gtt  Not on anticoagulation PTA.   Significant events:  2/1: S/p TNK 2/1 @ 0545 2/1: S/p thrombectomy 2/8: tx to CIR 2/18: CT angio chest:  bilateral PE , large clot burden and effects all lobes, evidence of right heart strain consistent with at least submassive  PE   Goal of Therapy:  Monitor platelets by anticoagulation protocol: Yes   Plan:  DC heparin gtt Start apixaban 10 mg po bid x7 days f/b 5 mg po bid (start 5 mg dose on 03/13/2023) Monitor for signs and symptoms of bleeding Pharmacy will sign off consult but continue monitoring making recs prn  Thank you    Greta Doom BS, PharmD, BCPS Clinical Pharmacist 03/06/2023 12:30 PM  Contact: [phone] after 3 PM  "Be curious, not judgmental..." -Debbora Dus

## 2023-03-06 NOTE — Progress Notes (Signed)
 PROGRESS NOTE   Subjective/Complaints:  No concerns or complaints this AM.   Working with therapy in bed.  Reports he slept well last night, feels tired today. He remembers DC date.   ROS: as per HPI. Denies fever, HA, abd pain, N/V/D/C, or any other complaints at this time.   +CP-resolved + Shortness of breath-  improved + fatigue   Objective:   VAS [place] LOWER EXTREMITY VENOUS (DVT) Result Date: 03/04/2023  Lower Venous DVT Study Patient Name:  Edward Rodgers  Date of Exam:   03/04/2023 Medical Rec #: [id number]       Accession #:    [id number] Date of Birth: [birth year]       Patient Gender: M Patient Age:   73 years Exam Location:  [facility] Procedure:      VAS [place] LOWER EXTREMITY VENOUS (DVT) Referring Phys: Mertha Baars --------------------------------------------------------------------------------  Indications: Pulmonary embolism, and stroke.  Comparison Study: Previous study on 2.4.2025. Performing Technologist: Fernande Bras  Examination Guidelines: A complete evaluation includes B-mode imaging, spectral Doppler, color Doppler, and power Doppler as needed of all accessible portions of each vessel. Bilateral testing is considered an integral part of a complete examination. Limited examinations for reoccurring indications may be performed as noted. The reflux portion of the exam is performed with the patient in reverse Trendelenburg.  +---------+---------------+---------+-----------+----------+--------------+ RIGHT    CompressibilityPhasicitySpontaneityPropertiesThrombus Aging +---------+---------------+---------+-----------+----------+--------------+ CFV      Full           Yes      Yes                                 +---------+---------------+---------+-----------+----------+--------------+ SFJ      Full           Yes      Yes                                  +---------+---------------+---------+-----------+----------+--------------+ FV Prox  Full                                                        +---------+---------------+---------+-----------+----------+--------------+ FV Mid   Full                                                        +---------+---------------+---------+-----------+----------+--------------+ FV DistalFull                                                        +---------+---------------+---------+-----------+----------+--------------+ PFV      Full                                                        +---------+---------------+---------+-----------+----------+--------------+  POP      Full           Yes      Yes                                 +---------+---------------+---------+-----------+----------+--------------+ PTV      Full                                                        +---------+---------------+---------+-----------+----------+--------------+ PERO     Full                                                        +---------+---------------+---------+-----------+----------+--------------+   +---------+---------------+---------+-----------+----------+--------------+ LEFT     CompressibilityPhasicitySpontaneityPropertiesThrombus Aging +---------+---------------+---------+-----------+----------+--------------+ CFV      Full           Yes      Yes                                 +---------+---------------+---------+-----------+----------+--------------+ SFJ      Full           Yes      Yes                                 +---------+---------------+---------+-----------+----------+--------------+ FV Prox  Full                                                        +---------+---------------+---------+-----------+----------+--------------+ FV Mid   Full                                                         +---------+---------------+---------+-----------+----------+--------------+ FV DistalFull                                                        +---------+---------------+---------+-----------+----------+--------------+ PFV      Full                                                        +---------+---------------+---------+-----------+----------+--------------+ POP      None           No       No                                  +---------+---------------+---------+-----------+----------+--------------+  PTV      Full                                                        +---------+---------------+---------+-----------+----------+--------------+ PERO     Full                                                        +---------+---------------+---------+-----------+----------+--------------+ Soleal   None           No       No                                  +---------+---------------+---------+-----------+----------+--------------+ IM       None           No       No                                  +---------+---------------+---------+-----------+----------+--------------+     Summary: RIGHT: - There is no evidence of deep vein thrombosis in the lower extremity.  - No cystic structure found in the popliteal fossa.  LEFT: - Findings consistent with acute deep vein thrombosis involving the left popliteal vein. Findings consistent with acute intramuscular thrombosis involving the left soleal veins, and left intramuscular calf vein. - No cystic structure found in the popliteal fossa.  *See table(s) above for measurements and observations. Electronically signed by Coral Else MD on 03/04/2023 at 8:14:36 PM.    Final    ECHOCARDIOGRAM LIMITED Result Date: 03/04/2023    ECHOCARDIOGRAM LIMITED REPORT   Patient Name:   Edward Rodgers Date of Exam: 03/04/2023 Medical Rec #:  [id number]      Height:       69.0 in Accession #:    [id number]     Weight:       195.8 lb Date of  Birth:  Jan 07, 1951      BSA:          2.047 m Patient Age:    73 years       BP:           140/81 mmHg Patient Gender: M              HR:           88 bpm. Exam Location:  Inpatient Procedure: Limited Echo, Limited Color Doppler and Intracardiac Opacification            Agent (Both Spectral and Color Flow Doppler were utilized during            procedure). Indications:    Chest Pain R07.9  History:        Patient has prior history of Echocardiogram examinations, most                 recent 02/15/2023. CHF, Stroke, Signs/Symptoms:Shortness of                 Breath; Risk Factors:Hypertension.  Sonographer:    Lucendia Herrlich RCS Referring Phys: [id number] ANGELA NICOLE  [place] IMPRESSIONS  1. Left ventricular ejection fraction, by estimation, is 40 to 45%. The left ventricle has mildly decreased function. The left ventricle demonstrates global hypokinesis. There is mild left ventricular hypertrophy.  2. The aortic valve is calcified. There is mild calcification of the aortic valve. Comparison(s): No significant change from prior study. Prior images reviewed side by side. FINDINGS  Left Ventricle: Left ventricular ejection fraction, by estimation, is 40 to 45%. The left ventricle has mildly decreased function. The left ventricle demonstrates global hypokinesis. Definity contrast agent was given IV to delineate the left ventricular  endocardial borders. There is mild left ventricular hypertrophy. Aortic Valve: The aortic valve is calcified. There is mild calcification of the aortic valve. LEFT VENTRICLE PLAX 2D LVIDd:         3.70 cm      Diastology LVIDs:         3.20 cm      LV e' medial:    6.96 cm/s LV PW:         1.20 cm      LV E/e' medial:  5.1 LV IVS:        0.80 cm      LV e' lateral:   8.92 cm/s LVOT diam:     2.10 cm      LV E/e' lateral: 4.0 LVOT Area:     3.46 cm  LV Volumes (MOD) LV vol d, MOD A2C: 132.0 ml LV vol d, MOD A4C: 137.0 ml LV vol s, MOD A2C: 101.0 ml LV vol s, MOD A4C: 68.2 ml LV SV MOD A2C:      31.0 ml LV SV MOD A4C:     137.0 ml LV SV MOD BP:      46.3 ml RIGHT VENTRICLE             IVC RV S prime:     11.00 cm/s  IVC diam: 1.40 cm TAPSE (M-mode): 1.9 cm LEFT ATRIUM         Index LA diam:    2.80 cm 1.37 cm/m   AORTA Ao Root diam: 3.90 cm Ao Asc diam:  3.90 cm MITRAL VALVE MV Area (PHT): 4.89 cm    SHUNTS MV Decel Time: 155 msec    Systemic Diam: 2.10 cm MV E velocity: 35.80 cm/s MV A velocity: 74.40 cm/s MV E/A ratio:  0.48 Donato Schultz MD Electronically signed by Donato Schultz MD Signature Date/Time: 03/04/2023/2:11:12 PM    Final    CT Angio Chest Pulmonary Embolism (PE) W or WO Contrast Addendum Date: 03/04/2023 ADDENDUM REPORT: 03/04/2023 11:36 ADDENDUM: The original report was by Dr. Gaylyn Rong. The following addendum is by Dr. Gaylyn Rong: Critical Value/emergent results were called by telephone at the time of interpretation on 03/04/2023 at 11:15 am to provider [facility] , who verbally acknowledged these results. Electronically Signed   By: Gaylyn Rong M.D.   On: 03/04/2023 11:36   Result Date: 03/04/2023 CLINICAL DATA:  Acute stroke, hypoxia. EXAM: CT ANGIOGRAPHY CHEST WITH CONTRAST TECHNIQUE: Multidetector CT imaging of the chest was performed using the standard protocol during bolus administration of intravenous contrast. Multiplanar CT image reconstructions and MIPs were obtained to evaluate the vascular anatomy. RADIATION DOSE REDUCTION: This exam was performed according to the departmental dose-optimization program which includes automated exposure control, adjustment of the mA and/or kV according to patient size and/or use of iterative reconstruction technique. CONTRAST:  75mL OMNIPAQUE IOHEXOL 350 MG/ML SOLN COMPARISON:  Chest radiograph 03/03/2023 FINDINGS: Cardiovascular:  Large volume pulmonary embolus in both the right and left pulmonary arteries, lobar branches, and segmental branches. Clot burden is high and affects all lobes. Right ventricular to left  ventricular ratio 1.2, abnormally elevated. Coronary, aortic arch, and branch vessel atherosclerotic vascular disease. Ascending thoracic aortic aneurysm 4.1 cm in diameter on image 64 series 5. Mild cardiomegaly. Mediastinum/Nodes: Unremarkable Lungs/Pleura: Small right pleural effusion with associated atelectasis. Mild atelectasis in the posterior basal segment left lower lobe. Centrilobular emphysema. Patchy ground-glass opacity anteriorly in the right upper lobe and peripherally in the right lower lobe, possibly from mild pulmonary hemorrhage. Upper Abdomen: Unremarkable Musculoskeletal: Thoracic spondylosis. Review of the MIP images confirms the above findings. IMPRESSION: 1. Large volume pulmonary embolus in both the right and left pulmonary arteries, lobar branches, and segmental branches. Clot burden is high and affects all lobes. Right ventricular to left ventricular ratio 1.2, abnormally elevated. Positive for acute PE with CT evidence of right heart strain consistent with at least submassive (intermediate risk) PE. The presence of right heart strain has been associated with an increased risk of morbidity and mortality. Please refer to the "Code PE Focused" order set in EPIC. 2. Small right pleural effusion with associated atelectasis. Mild atelectasis in the posterior basal segment left lower lobe. 3. Patchy ground-glass opacity anteriorly in the right upper lobe and peripherally in the right lower lobe, possibly from mild pulmonary hemorrhage. 4. Mild cardiomegaly. 5. Ascending thoracic aortic aneurysm 4.1 cm in diameter. Recommend annual imaging followup by CTA or MRA. This recommendation follows 2010 ACCF/AHA/AATS/ACR/ASA/SCA/SCAI/SIR/STS/SVM Guidelines for the Diagnosis and Management of Patients with Thoracic Aortic Disease. Circulation. 2010; 121: Z610-R604. Aortic aneurysm NOS (ICD10-I71.9) 6. Aortic and coronary atherosclerosis. Aortic Atherosclerosis (ICD10-I70.0) and Emphysema (ICD10-J43.9).  Radiology assistant personnel have been notified to put me in telephone contact with the referring physician or the referring physician's clinical representative in order to discuss these findings. Once this communication is established I will issue an addendum to this report for documentation purposes. Electronically Signed: By: Gaylyn Rong M.D. On: 03/04/2023 11:12   Recent Labs    03/05/23 0612 03/06/23 0550  WBC 5.8 4.5  HGB 10.8* 11.5*  HCT 33.0* 35.2*  PLT 199 206    Recent Labs    03/04/23 1211 03/05/23 0612  NA 135 136  K 3.8 3.8  CL 102 106  CO2 21* 21*  GLUCOSE 105* 96  BUN 24* 21  CREATININE 1.44* 1.25*  CALCIUM 9.5 9.0        Intake/Output Summary (Last 24 hours) at 03/06/2023 0902 Last data filed at 03/06/2023 0800 Gross per 24 hour  Intake 630 ml  Output 600 ml  Net 30 ml         Physical Exam: Vital Signs Blood pressure 134/87, pulse 74, temperature 97.9 F (36.6 C), temperature source Oral, resp. rate 18, height 5\' 9"  (1.753 m), weight 89.4 kg, SpO2 98%.  Constitutional: NAD, Laying in bed HENT:     Head: Normocephalic and atraumatic.     Nose: Nose normal.     Mouth/Throat:     Mouth: Mucous membranes are moist.   Eyes:     Extraocular Movements: Extraocular movements intact.     Conjunctiva/sclera: Conjunctivae normal.     Pupils: Pupils are equal, round, and reactive to light.  Cardiovascular:     Rate and Rhythm: Normal rate and regular rhythm.  Pulmonary:     Effort: Pulmonary effort is normal. No respiratory distress. + [place] O2 2L  Breath sounds: No stridor. No wheezing or rhonchi.  Abdominal:     General: Bowel sounds are normal. There is no distension.     Palpations: Abdomen is soft.     Tenderness: There is no abdominal tenderness.  Musculoskeletal:        General: Swelling (trace LUE) present. Improving    Cervical back: Normal range of motion.      No pedal edema appreciated  Skin:    General: Skin is warm and  dry.     Findings: No bruising or erythema.  Psychiatric:     Comments: Pt pleasant and cooperative  Neuro: alert, slightly dysarthric which is baseline, occasional staring off but no confusion and no postictal period.   PRIOR EXAMS: Neurological:     Mental Status: He is awake working with therapy, appears a little speepy    Comments: Alert and oriented x 4.  Slightly dysarthric speech.  Fair insight and awareness.  Language overall appears to be intact.  Left central 7.  Decreased sensation to light touch in left upper extremity and left lower extremity.  Moving all 4 extremities to gravity and resistance Mildly increased tone R hand.    Assessment/Plan: 1. Functional deficits which require 3+ hours per day of interdisciplinary therapy in a comprehensive inpatient rehab setting. Physiatrist is providing close team supervision and 24 hour management of active medical problems listed below. Physiatrist and rehab team continue to assess barriers to discharge/monitor patient progress toward functional and medical goals  Care Tool:  Bathing    Body parts bathed by patient: Left arm, Chest, Abdomen, Front perineal area, Right upper leg, Left upper leg, Right lower leg, Face   Body parts bathed by helper: Right arm, Buttocks, Left lower leg     Bathing assist Assist Level: Moderate Assistance - Patient 50 - 74%     Upper Body Dressing/Undressing Upper body dressing        Upper body assist Assist Level: Moderate Assistance - Patient 50 - 74%    Lower Body Dressing/Undressing Lower body dressing            Lower body assist Assist for lower body dressing: Maximal Assistance - Patient 25 - 49%     Toileting Toileting    Toileting assist Assist for toileting: Maximal Assistance - Patient 25 - 49%     Transfers Chair/bed transfer  Transfers assist     Chair/bed transfer assist level: Moderate Assistance - Patient 50 - 74% (squat pivot/stand pivot)      Locomotion Ambulation   Ambulation assist   Ambulation activity did not occur: Safety/medical concerns  Assist level: 2 helpers (+2 for WC follow) Assistive device: Walker-rolling Max distance: 50 feet   Walk 10 feet activity   Assist  Walk 10 feet activity did not occur: Safety/medical concerns  Assist level: 2 helpers Assistive device: Walker-rolling   Walk 50 feet activity   Assist Walk 50 feet with 2 turns activity did not occur: Safety/medical concerns  Assist level: 2 helpers Assistive device: Walker-rolling    Walk 150 feet activity   Assist Walk 150 feet activity did not occur: Safety/medical concerns         Walk 10 feet on uneven surface  activity   Assist Walk 10 feet on uneven surfaces activity did not occur: Safety/medical concerns         Wheelchair     Assist Is the patient using a wheelchair?: Yes Type of Wheelchair: Manual    Wheelchair assist  level: Minimal Assistance - Patient > 75% Max wheelchair distance: 50    Wheelchair 50 feet with 2 turns activity    Assist        Assist Level: Minimal Assistance - Patient > 75%   Wheelchair 150 feet activity     Assist      Assist Level: Maximal Assistance - Patient 25 - 49%   Blood pressure 134/87, pulse 74, temperature 97.9 F (36.6 C), temperature source Oral, resp. rate 18, height 5\' 9"  (1.753 m), weight 89.4 kg, SpO2 98%.  Medical Problem List and Plan: 1. 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  Await plan for loop recorder             -patient may shower -ELOS/Goals: 21-25 days, min assist with PT, OT and supervision with SLP -Continue CIR -Expected discharge 03/14/2023 -Discontinue Bed level activities today  2.  Antithrombotics: -DVT/anticoagulation:  Pharmaceutical: Heparin 5000U q8h -antiplatelet therapy: Aspirin 81 mg daily and Plavix 75 mg day x 3 weeks then Plavix  alone -2/20 DC aspirin and Plavix as on heparin, discussed with neurology and cardiology 3. Pain Management: Lidoderm patch as directed, Tylenol as needed 4. Mood/Sleep/sundowning: Effexor 150 mg every evening, provide emotional support             -antipsychotic agents: Seroquel 50 mg nighty at 2030, have back up dose             -keep sleep chart 5. Neuropsych/cognition: This patient is capable of making decisions on his own behalf. 6. Skin/Wound Care: Routine skin checks 7. Fluids/Electrolytes/Nutrition: Routine in and outs with follow-up chemistries, continue vitamins/supplements 8.  Hypothyroidism.  Synthroid 47mcg/day 9.  Hypertension. Norvasc 10 mg daily, Toprol-Xl 100 mg daily, Aldactone 25 mg daily.             -bp trending up. Likely will need additions to regimen  -02/22/23 BPs 140s/90s, not terrible, monitor trend with increase mobility  2/10 BP controlled, decrease spironolactone due to elevated CR/BUN -02/28/23 BP stable but creeping up, continue to monitor for now but may need to increase meds back -2/21 BP stable continue to monitor    Vitals:   03/04/23 1700 03/04/23 2004 03/05/23 0000 03/05/23 0353  BP: (!) 132/99 (!) 136/96 115/89 132/86   03/05/23 0743 03/05/23 0747 03/05/23 1136 03/05/23 1540  BP: 119/89 119/89 132/85 137/87   03/05/23 1943 03/06/23 0100 03/06/23 0613 03/06/23 0823  BP: (!) 156/97 128/85 134/87 134/87    10.H/O prostate cancer. Myrbetriq 50 mg daily. 11.Hyperlipidemia. Lipitor 80mg  daily 12.Constipation.             -miralax daily, SenokotS 1 tab daily -02/22/23 LBM 2days ago, monitor today, if no BM tomorrow then may want to increase regimen -2/21 LMB today 13.H/O gout. Zyloprim 300mg  daily 14.  AKI versus CKD  -Encourage oral fluid intake, decrease spironolactone to 12.5 mg  -2/11 recheck BMP today  Addendum- increased Cr, stop spironolactone, start IVF NS 44ml/HR  -2/12 Cr slightly improved, continue IVF, encourage oral fluids  -2/13 recheck  BMP- CR down to 1.2  -2/14 DC IVF, discussed oral hydration  2/17 BUN/CR stable 17/1.25  -2/18 continue to encourage oral hydration  -2/20 BUN and creatinine improved to 21/1.25, got IV fluids last night 46ml/hr   2/21 Encourage PO fluid intake  15.  History of CHF.  Last EF 55%, mild diastolic dysfunction  -Continue metoprolol, decrease spironolactone to 12.5 mg  as above -2/14 Wts stable, has not appear to show signs of fluid overload, continue to monitor -02/28/23 wt up but doubt accuracy, monitor, no overload on exam -2/19-20 weight stable, does not appear to show signs of fluid overload, EF 40 to 45% on echo. -Cardiology has been following, they discontinued amlodipine and started losartan 25 mg daily-beginning tomorrow  -[facility]  Filed Weights   03/03/23 0500 03/04/23 0500 03/05/23 0600  Weight: 88.9 kg 88.8 kg 89.4 kg    16.  Severe cervical stenosis: Consider neurosurgery outpatient follow-up 17. Insomnia -Patient has Seroquel 50 mg scheduled and 25 mg as needed.  Will also add in 3 mg melatonin at bedtime  -2/11 increase Seroquel to 75 mg nightly  -2/13 asked nursing to complete sleep-wake chart  -2/14 reports sleeping better, continue current regimen for now and monitor  -2/20 will decrease Seroquel back to 50 mg nightly and 25 mg at bedtime  2/21 Decrease seroquel to 25mg  nightly and 25mg  PRN  18.  Bleeding from his penis  -UA neg,HGB was stable on 2/11 -Denies additional occurrences.  During team meeting was noted this happened intermittently during acute hospital stay  19. Nasal congestion: Flonase 1 spray daily  20. Staring episodes/vision changes 03/01/23 -03/01/23 having staring episodes and ?chronic vision changes/hemianopsia? Spoke with Dr. Derry Lory of neuro, will get 24hr EEG and head CT, no code stroke at this point; they will see pt, appreciate their assistance.  -2/17 overnight EEG neg, CT head without acute  changes  21. Chest pain  -EKG, Trop, CXR ordered  -Cardiology following appreciate assistance.  D-dimer positive, getting CT PE study to rule out PE.  If positive will need to discuss with neurology regarding anticoagulation.  He is now on 2 L nasal cannula oxygen  -2/20 patient reports his chest pain is resolved  22. Pulmonary embolism / DVT left popliteal vein -2/19 discussed with neurology and cardiology. Heparin grip started, wife updated by phone. Critical care medicine consulted 2/20 seen by critical care, continue heparin eventual transition to DOAC, if hemodynamic changes contact PCCM again. Advised 24h bedrest with [facility] 2/21 plan to transition to DOAC later today, discussed with pharmacy by phone   LOS: 13 days A FACE TO FACE EVALUATION WAS PERFORMED  Fanny Dance 03/06/2023, 9:02 AM

## 2023-03-06 NOTE — Progress Notes (Signed)
 Speech Language Pathology Daily Session Note  Patient Details  Name: Edward Rodgers MRN: [id number] Date of Birth: 03-Jul-1950  Today's Date: 03/06/2023 SLP Individual Time: 0831-0905 SLP Individual Time Calculation (min): 34 min and Today's Date: 03/06/2023 SLP Missed Time: 26 Minutes Missed Time Reason: Patient fatigue  Short Term Goals: Week 2: SLP Short Term Goal 1 (Week 2): Patient will solve basic environmental and functional problems with 80% accuracy given mod assist. SLP Short Term Goal 2 (Week 2): Patient will utilize external and internal memory aids to recall daily information with 75% accuracy given mod assist. SLP Short Term Goal 3 (Week 2): Patient will demonstrate awareness of deficits by stating 2 cognitive and 2 physical deficits when provided with mod verbal cues.  Skilled Therapeutic Interventions:   Pt seen for skilled SLP session to address cognitive goals. Pt lethargic throughout session, though woke to voice multiple times. He required max stimulation throughout session to maintain alertness and attention to tasks. Despite verbal and tactile cues, pt eventually gave completely in to sleep and session was ended. Pt completed visual safety awareness tasks x2 with <50% accuracy; suspect lethargy impacted his accuracy. Attempted visual memory exercises, which pt partly completed with ~75% accuracy independently. Pt left in bed with bed alarm activated and call bell in reach. Continue SLP PoC.   Pain Pain Assessment Pain Scale: 0-10 Pain Score: 8  Pain Type: Chronic pain Pain Location: Knee Pain Orientation: Left Pain Frequency: Constant Pain Onset: On-going Patients Stated Pain Goal: 3 Pain Intervention(s): RN made aware  Therapy/Group: Individual Therapy  Ellery Plunk 03/06/2023, 9:12 AM

## 2023-03-06 NOTE — Progress Notes (Signed)
 Physical Therapy Session Note  Patient Details  Name: Edward Rodgers MRN: [id number] Date of Birth: 10-28-1950  Today's Date: 03/06/2023 PT Individual Time: [phone] and [phone] PT Individual Time Calculation (min): 25 min and 30 min  Short Term Goals: Week 2:  PT Short Term Goal 1 (Week 2): pt will perform bed mobility with CGA or less PT Short Term Goal 2 (Week 2): pt will ambulate 100 feet with RW and mod A or less PT Short Term Goal 3 (Week 2): pt will perform car transfer with mod A or less with LRAD  Skilled Therapeutic Interventions/Progress Updates: Pt presented in bed agreeable to therapy. Pt c/o pain in L knee, nsg arrived during session and provided pain meds. Pt noted to still be on Heparin drip therefore continues to be on bed level activity only. Session focused on supine LE therex with therapist explaining to pt will continue activities in later session. Pt asking why he is on bed level activities only, reoriented pt that currently with PE and DVT and currently on Heparin drip. Pt participated in the following activities to fatigue with PTA cueing pt frequently for increased range/activation/ attention to LLE: ankle pumps, heel slides, SAQ. Pt noted to periodically close eyes during session with pt stating eyes feel "heavy".  Pt left in bed at end of session with call bell within reach and needs met.   Tx2: Pt presented in bed agreeable to therapy. Pt continues to be on Heparin drip therefore continues to only be able to participate in bed level activities. Pt requesting to go to bathroom. Reoriented pt that unable to be OOB therefore necessary to use bedpan. Pt expressed frustration but verbalized understanding. Pt therefore completed roll to R with modA as PTA providing AA to reach for bed rail and cues to use LLE to allow for bedpan placement. Pt then performed partial roll to L with minA to adjust bed pan. Pt with continent BM in bed pan but required increased time. Upon PTA  return to performed rolling in same manner for bed pan removal and total A for peri-care and donning new brief. While pt in sidelying pt was able to abduct LLE to ensure cleanliness. With bed placed in trendelenburg pt was able to use BLE and reached for headboard with BUE to pull self up to [facility] with increased time and cues. Pt was repositioned to comfort and left in bed at end of session with bed alarm on, call bell within reach and needs met.      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 Restrictions Weight Bearing Restrictions Per Provider Order: No General: PT Amount of Missed Time (min): 15 Minutes PT Missed Treatment Reason: Toileting Vital Signs: Therapy Vitals Temp: 97.9 F (36.6 C) Pulse Rate: 77 Resp: 18 BP: (!) 143/93 Patient Position (if appropriate): Lying Oxygen Therapy SpO2: 99 % O2 Device: Room Air     Therapy/Group: Individual Therapy  Sakeenah Valcarcel 03/06/2023, 4:18 PM

## 2023-03-06 NOTE — Progress Notes (Signed)
 Chart reviewed.   Vital signs remain stable.  Temp:  [97.7 F (36.5 C)-98.2 F (36.8 C)] 97.9 F (36.6 C) (02/21 0613) Pulse Rate:  [74-86] 74 (02/21 0823) Resp:  [17-20] 18 (02/21 0613) BP: (128-156)/(85-97) 134/87 (02/21 0823) SpO2:  [95 %-98 %] 98 % (02/21 0613)  LABORATORY DATA:    Latest Ref Rng & Units 03/06/2023    5:50 AM 03/05/2023    6:12 AM 03/04/2023   12:11 PM  CBC  WBC 4.0 - 10.5 K/uL 4.5  5.8  3.8   Hemoglobin 13.0 - 17.0 g/dL 04.5  40.9  81.1   Hematocrit 39.0 - 52.0 % 35.2  33.0  35.7   Platelets 150 - 400 K/uL 206  199  179        Latest Ref Rng & Units 03/05/2023    6:12 AM 03/04/2023   12:11 PM 03/02/2023    5:45 AM  CMP  Glucose 70 - 99 mg/dL 96  914  782   BUN 8 - 23 mg/dL 21  24  17    Creatinine 0.61 - 1.24 mg/dL 9.56  2.13  0.86   Sodium 135 - 145 mmol/L 136  135  137   Potassium 3.5 - 5.1 mmol/L 3.8  3.8  4.2   Chloride 98 - 111 mmol/L 106  102  103   CO2 22 - 32 mmol/L 21  21  22    Calcium 8.9 - 10.3 mg/dL 9.0  9.5  9.6     Will be transitioning from Norvasc to Losartan today and BMP tomorrow.   Will see him tomorrow to see if we can continue to increase his GDMT.   No charge.   [place], DO, [facility]  9:32 AM

## 2023-03-07 LAB — CBC
HCT: 35.7 % — ABNORMAL LOW (ref 39.0–52.0)
Hemoglobin: 11.4 g/dL — ABNORMAL LOW (ref 13.0–17.0)
MCH: 27.8 pg (ref 26.0–34.0)
MCHC: 31.9 g/dL (ref 30.0–36.0)
MCV: 87.1 fL (ref 80.0–100.0)
Platelets: 241 10*3/uL (ref 150–400)
RBC: 4.1 MIL/uL — ABNORMAL LOW (ref 4.22–5.81)
RDW: 14.1 % (ref 11.5–15.5)
WBC: 3.9 10*3/uL — ABNORMAL LOW (ref 4.0–10.5)
nRBC: 0 % (ref 0.0–0.2)

## 2023-03-07 LAB — BASIC METABOLIC PANEL
Anion gap: 8 (ref 5–15)
BUN: 18 mg/dL (ref 8–23)
CO2: 22 mmol/L (ref 22–32)
Calcium: 9.2 mg/dL (ref 8.9–10.3)
Chloride: 105 mmol/L (ref 98–111)
Creatinine, Ser: 0.99 mg/dL (ref 0.61–1.24)
GFR, Estimated: >60 mL/min (ref >60)
Glucose, Bld: 143 mg/dL — ABNORMAL HIGH (ref 70–99)
Potassium: 3.7 mmol/L (ref 3.5–5.1)
Sodium: 135 mmol/L (ref 135–145)

## 2023-03-07 MED ORDER — SODIUM CHLORIDE 0.9 % IV SOLN: Freq: Once | INTRAVENOUS | Status: AC

## 2023-03-07 MED ORDER — SODIUM CHLORIDE 0.9 % IV SOLN: Freq: Every evening | INTRAVENOUS | Status: AC | PRN

## 2023-03-07 NOTE — Progress Notes (Signed)
 PROGRESS NOTE   Subjective/Complaints:  Pt doing well, slept ok, denies pain, LBM yesterday then again today, urinating fine, having some nausea intermittently still but hasn't asked for anything for it. Denies any other complaints or concerns during eval-- but later, nurse stating he was dizzy. Hasn't been drinking much fluids per flowsheet.  Cardiology saw pt, signing off.   ROS: as per HPI. Denies fever, HA, abd pain, V/D/C, or any other complaints at this time.   +CP-resolved + Shortness of breath-  resolved + fatigue   Objective:   No results found.  Recent Labs    03/06/23 0550 03/07/23 0940  WBC 4.5 3.9*  HGB 11.5* 11.4*  HCT 35.2* 35.7*  PLT 206 241    Recent Labs    03/05/23 0612 03/07/23 0940  NA 136 135  K 3.8 3.7  CL 106 105  CO2 21* 22  GLUCOSE 96 143*  BUN 21 18  CREATININE 1.25* 0.99  CALCIUM 9.0 9.2        Intake/Output Summary (Last 24 hours) at 03/07/2023 1133 Last data filed at 03/07/2023 0836 Gross per 24 hour  Intake 598 ml  Output 300 ml  Net 298 ml         Physical Exam: Vital Signs Blood pressure 118/84, pulse 82, temperature 97.8 F (36.6 C), temperature source Oral, resp. rate 20, height 5\' 9"  (1.753 m), weight 88.8 kg, SpO2 96%.  Constitutional: NAD, Laying in bed HENT:     Head: Normocephalic and atraumatic.     Nose: Nose normal.     Mouth/Throat:     Mouth: Mucous membranes are moist.   Eyes:     Extraocular Movements: Extraocular movements intact.     Conjunctiva/sclera: Conjunctivae normal.     Pupils: Pupils are equal, round, and reactive to light.  Cardiovascular:     Rate and Rhythm: Normal rate and regular rhythm. No m/r/g appreciated Pulmonary:     Effort: Pulmonary effort is normal. No respiratory distress.     Breath sounds: No stridor. No wheezing or rhonchi.  Abdominal:     General: Bowel sounds are normal. There is no distension.      Palpations: Abdomen is soft.     Tenderness: There is no abdominal tenderness.  Musculoskeletal:        General: Swelling (trace LUE) present. Improving    Cervical back: Normal range of motion.      No pedal edema appreciated  Skin:    General: Skin is warm and dry.     Findings: No bruising or erythema.  Psychiatric:     Comments: Pt pleasant and cooperative  Neuro: alert, slightly dysarthric which is baseline, no staring episodes today   PRIOR EXAMS: Neurological:     Mental Status: He is awake working with therapy, appears a little speepy    Comments: Alert and oriented x 4.  Slightly dysarthric speech.  Fair insight and awareness.  Language overall appears to be intact.  Left central 7.  Decreased sensation to light touch in left upper extremity and left lower extremity.  Moving all 4 extremities to gravity and resistance Mildly increased tone R hand.    Assessment/Plan:  1. Functional deficits which require 3+ hours per day of interdisciplinary therapy in a comprehensive inpatient rehab setting. Physiatrist is providing close team supervision and 24 hour management of active medical problems listed below. Physiatrist and rehab team continue to assess barriers to discharge/monitor patient progress toward functional and medical goals  Care Tool:  Bathing    Body parts bathed by patient: Left arm, Chest, Abdomen, Front perineal area, Right upper leg, Left upper leg, Right lower leg, Face   Body parts bathed by helper: Right arm, Buttocks, Left lower leg     Bathing assist Assist Level: Moderate Assistance - Patient 50 - 74%     Upper Body Dressing/Undressing Upper body dressing        Upper body assist Assist Level: Moderate Assistance - Patient 50 - 74%    Lower Body Dressing/Undressing Lower body dressing            Lower body assist Assist for lower body dressing: Maximal Assistance - Patient 25 - 49%     Toileting Toileting    Toileting assist Assist for  toileting: Maximal Assistance - Patient 25 - 49%     Transfers Chair/bed transfer  Transfers assist     Chair/bed transfer assist level: Moderate Assistance - Patient 50 - 74% (squat pivot/stand pivot)     Locomotion Ambulation   Ambulation assist   Ambulation activity did not occur: Safety/medical concerns  Assist level: 2 helpers (+2 for WC follow) Assistive device: Walker-rolling Max distance: 50 feet   Walk 10 feet activity   Assist  Walk 10 feet activity did not occur: Safety/medical concerns  Assist level: 2 helpers Assistive device: Walker-rolling   Walk 50 feet activity   Assist Walk 50 feet with 2 turns activity did not occur: Safety/medical concerns  Assist level: 2 helpers Assistive device: Walker-rolling    Walk 150 feet activity   Assist Walk 150 feet activity did not occur: Safety/medical concerns         Walk 10 feet on uneven surface  activity   Assist Walk 10 feet on uneven surfaces activity did not occur: Safety/medical concerns         Wheelchair     Assist Is the patient using a wheelchair?: Yes Type of Wheelchair: Manual    Wheelchair assist level: Minimal Assistance - Patient > 75% Max wheelchair distance: 50    Wheelchair 50 feet with 2 turns activity    Assist        Assist Level: Minimal Assistance - Patient > 75%   Wheelchair 150 feet activity     Assist      Assist Level: Maximal Assistance - Patient 25 - 49%   Blood pressure 118/84, pulse 82, temperature 97.8 F (36.6 C), temperature source Oral, resp. rate 20, height 5\' 9"  (1.753 m), weight 88.8 kg, SpO2 96%.  Medical Problem List and Plan: 1. 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  Await plan for loop recorder             -patient may shower -ELOS/Goals: 21-25 days, min assist with PT, OT and supervision with SLP -Continue CIR -Expected discharge  03/14/2023 -Discontinue Bed level activities today  2.  Antithrombotics: -DVT/anticoagulation:  Pharmaceutical: eliquis per pharmacy -03/07/23 now on eliquis per pharmacy -antiplatelet therapy: Aspirin 81 mg daily and Plavix 75 mg day x 3 weeks then Plavix alone -2/20 DC aspirin and Plavix as on heparin, discussed with neurology  and cardiology-- 2/22 now on eliquis  3. Pain Management: Lidoderm patch as directed, Tylenol as needed 4. Mood/Sleep/sundowning: Effexor 150 mg every evening, provide emotional support             -antipsychotic agents: Seroquel 50 mg nighty at 2030, have back up dose             -keep sleep chart 5. Neuropsych/cognition: This patient is capable of making decisions on his own behalf. 6. Skin/Wound Care: Routine skin checks 7. Fluids/Electrolytes/Nutrition: Routine in and outs with follow-up chemistries, continue vitamins/supplements 8.  Hypothyroidism.  Synthroid 10mcg/day 9.  Hypertension. Norvasc 10 mg daily, Toprol-Xl 100 mg daily, Aldactone 25 mg daily.             -bp trending up. Likely will need additions to regimen  -02/22/23 BPs 140s/90s, not terrible, monitor trend with increase mobility  2/10 BP controlled, decrease spironolactone due to elevated CR/BUN -02/28/23 BP stable but creeping up, continue to monitor for now but may need to increase meds back -2/21-22 BP stable continue to monitor    Vitals:   03/05/23 0743 03/05/23 0747 03/05/23 1136 03/05/23 1540  BP: 119/89 119/89 132/85 137/87   03/05/23 1943 03/06/23 0100 03/06/23 0613 03/06/23 0823  BP: (!) 156/97 128/85 134/87 134/87   03/06/23 1314 03/07/23 0416 03/07/23 1026 03/07/23 1040  BP: (!) 143/93 (!) 165/92 (!) 125/90 118/84    10.H/O prostate cancer. Myrbetriq 50 mg daily. 11.Hyperlipidemia. Lipitor 80mg  daily 12.Constipation.             -miralax daily, SenokotS 1 tab daily -02/22/23 LBM 2days ago, monitor today, if no BM tomorrow then may want to increase regimen -03/07/23 LBM today after  eval, cont regimen 13.H/O gout. Zyloprim 300mg  daily 14.  AKI versus CKD  -Encourage oral fluid intake, decrease spironolactone to 12.5 mg  -2/11 recheck BMP today  Addendum- increased Cr, stop spironolactone, start IVF NS 27ml/HR  -2/12 Cr slightly improved, continue IVF, encourage oral fluids  -2/13 recheck BMP- CR down to 1.2  -2/14 DC IVF, discussed oral hydration  2/17 BUN/CR stable 17/1.25  -2/18 continue to encourage oral hydration -2/20 BUN and creatinine improved to 21/1.25, got IV fluids last night 50ml/hr   2/21 Encourage PO fluid intake  -03/07/23 Cr 0.99 today, poor PO intake, a little dizzy this morning, will give IVFs @50ml /hr x12hrs now, and have standing PRN order for 32ml/hr x12hrs at bedtime if PO intake <1217ml/day  15.  History of CHF.  Last EF 55%, mild diastolic dysfunction  -Continue metoprolol, decrease spironolactone to 12.5 mg as above -2/14 Wts stable, has not appear to show signs of fluid overload, continue to monitor -02/28/23 wt up but doubt accuracy, monitor, no overload on exam -2/19-20 weight stable, does not appear to show signs of fluid overload, EF 40 to 45% on echo. -Cardiology has been following, they discontinued amlodipine and started losartan 25 mg daily-beginning tomorrow-- signing off 2/22 (see recs below)  -Follow-up with cardiology at the [facility] postdischarge  Filed Weights   03/05/23 0600 03/06/23 1100 03/07/23 0419  Weight: 89.4 kg 89.9 kg 88.8 kg    16.  Severe cervical stenosis: Consider neurosurgery outpatient follow-up 17. Insomnia -Patient has Seroquel 50 mg scheduled and 25 mg as needed.  Will also add in 3 mg melatonin at bedtime  -2/11 increase Seroquel to 75 mg nightly  -2/13 asked nursing to complete sleep-wake chart  -2/14 reports sleeping better, continue current regimen for now and monitor  -2/20  will decrease Seroquel back to 50 mg nightly and 25 mg at bedtime  2/21 Decrease seroquel to 25mg  nightly and 25mg  PRN  18.   Bleeding from his penis  -UA neg,HGB was stable on 2/11 -Denies additional occurrences.  During team meeting was noted this happened intermittently during acute hospital stay  19. Nasal congestion: Flonase 1 spray daily  20. Staring episodes/vision changes 03/01/23 -03/01/23 having staring episodes and ?chronic vision changes/hemianopsia? Spoke with Dr. Derry Lory of neuro, will get 24hr EEG and head CT, no code stroke at this point; they will see pt, appreciate their assistance.  -2/17 overnight EEG neg, CT head without acute changes  21. Chest pain  -EKG, Trop, CXR ordered  -Cardiology following appreciate assistance.  D-dimer positive, getting CT PE study to rule out PE.  If positive will need to discuss with neurology regarding anticoagulation.  He is now on 2 L nasal cannula oxygen  -2/20 patient reports his chest pain is resolved  22. Pulmonary embolism / DVT left popliteal vein -2/19 discussed with neurology and cardiology. Heparin grip started, wife updated by phone. Critical care medicine consulted 2/20 seen by critical care, continue heparin eventual transition to DOAC, if hemodynamic changes contact PCCM again. Advised 24h bedrest with [facility] 2/21 plan to transition to DOAC later today, discussed with pharmacy by phone  -03/07/23 cardiology saw pt, recommend eliquis per protolocol, continue toprol/low dose losartan, and they signed off-- f/up in 3-4wks with Dr. Odis Hollingshead  LOS: 14 days A FACE TO [facility] EVALUATION WAS PERFORMED  [address] 03/07/2023, 11:33 AM

## 2023-03-07 NOTE — Progress Notes (Signed)
 Patient from 2L to room air overnight. Monitored on CPOX monitor and  no desats noted.  Pt has remained on room air for remainder of shift.

## 2023-03-07 NOTE — Progress Notes (Signed)
 Speech Language Pathology Daily Session Note  Patient Details  Name: Edward Rodgers MRN: [id number] Date of Birth: [birth year]  Today's Date: 03/07/2023 SLP Individual Time: 4098-1191 SLP Individual Time Calculation (min): 47 min  Short Term Goals: Week 2: SLP Short Term Goal 1 (Week 2): Patient will solve basic environmental and functional problems with 80% accuracy given mod assist. SLP Short Term Goal 2 (Week 2): Patient will utilize external and internal memory aids to recall daily information with 75% accuracy given mod assist. SLP Short Term Goal 3 (Week 2): Patient will demonstrate awareness of deficits by stating 2 cognitive and 2 physical deficits when provided with mod verbal cues.  Skilled Therapeutic Interventions:  Pt lying in bed with family present upon SLP arrival. Denied pain. Reported he didn't eat lunch today and was awaiting soup to be delivered from the kitchen. SLP facilitated cognitive-linguistic therapy targeting compensatory memory strategies and problem solving. Pt oriented to month and year, used an external memory aid (calendar) to orient to current day with min A. He reports he is not using his memory notebook. SLP provided encouragement to use it and re-educated on rationale for its use. During problem solving tasks for medication management, pt identified med errors with 100% acc independently and was able to provide solutions with 100% acc independently. He required min verbal cues to check for errors in a more systematic way to reduce chance of mistakes but otherwise did well with the task. Discussed options for med management given his memory deficits and strategies. Pt with question about if he is allowed to get up by himself; SLP provided education to always call his nurse. Pt verbalized understanding. Also with questions about what criteria he needs to meet for d/c home. SLP provided set up A for pt's soup and pt was left in bed with alarm set and call bell in reach.  Spoke with nurse re: pt questions. Continue ST POC.        Pain Pain Assessment Pain Scale: 0-10 Pain Score: 0-No pain  Therapy/Group: Individual Therapy  [name] 03/07/2023, 4:37 PM

## 2023-03-07 NOTE — Progress Notes (Signed)
 Progress Note  Patient Name: Edward Rodgers Date of Encounter: 03/07/2023  Primary Cardiologist: Tessa Lerner, DO   Subjective   No chest pain or sob.   Inpatient Medications    Scheduled Meds:  allopurinol  300 mg Oral q morning   apixaban  10 mg Oral BID   Followed by   Melene Muller ON 03/13/2023] apixaban  5 mg Oral BID   atorvastatin  80 mg Oral Daily   calcium-vitamin D  1 tablet Oral Daily   feeding supplement  237 mL Oral BID BM   fluticasone  1 spray Each Nare Daily   latanoprost  1 drop Right Eye QHS   levothyroxine  88 mcg Oral QAC breakfast   lidocaine  1 patch Transdermal Q24H   losartan  25 mg Oral q morning   melatonin  3 mg Oral QHS   metoprolol succinate  100 mg Oral Daily   mirabegron ER  50 mg Oral Daily   polyethylene glycol  17 g Oral Daily   QUEtiapine  25 mg Oral QHS   senna-docusate  1 tablet Oral QHS   venlafaxine XR  150 mg Oral QPM   Continuous Infusions:  PRN Meds: acetaminophen **OR** acetaminophen (TYLENOL) oral liquid 160 mg/5 mL **OR** acetaminophen, ondansetron, QUEtiapine, sodium chloride   Vital Signs    Vitals:   03/06/23 1100 03/06/23 1314 03/07/23 0416 03/07/23 0419  BP:  (!) 143/93 (!) 165/92   Pulse:  77 74   Resp:  18 17   Temp:  97.9 F (36.6 C) 98 F (36.7 C)   TempSrc:      SpO2:  99% 95%   Weight: 89.9 kg   88.8 kg  Height:        Intake/Output Summary (Last 24 hours) at 03/07/2023 1005 Last data filed at 03/07/2023 0836 Gross per 24 hour  Intake 598 ml  Output 300 ml  Net 298 ml   Filed Weights   03/05/23 0600 03/06/23 1100 03/07/23 0419  Weight: 89.4 kg 89.9 kg 88.8 kg    Telemetry    none - Personally Reviewed  ECG    none - Personally Reviewed  Physical Exam   GEN: No acute distress.   Neck: No JVD Cardiac: RRR, no murmurs, rubs, or gallops.  Respiratory: Clear to auscultation bilaterally. GI: Soft, nontender, non-distended  MS: No edema; No deformity. Neuro:  Nonfocal  Psych: Normal affect    Labs    Chemistry Recent Labs  Lab 03/02/23 0545 03/04/23 1211 03/05/23 0612  NA 137 135 136  K 4.2 3.8 3.8  CL 103 102 106  CO2 22 21* 21*  GLUCOSE 101* 105* 96  BUN 17 24* 21  CREATININE 1.25* 1.44* 1.25*  CALCIUM 9.6 9.5 9.0  GFRNONAA >60 52* >60  ANIONGAP 12 12 9      Hematology Recent Labs  Lab 03/04/23 1211 03/05/23 0612 03/06/23 0550  WBC 3.8* 5.8 4.5  RBC 4.10* 3.77* 4.04*  HGB 11.4* 10.8* 11.5*  HCT 35.7* 33.0* 35.2*  MCV 87.1 87.5 87.1  MCH 27.8 28.6 28.5  MCHC 31.9 32.7 32.7  RDW 14.2 14.3 14.1  PLT 179 199 206    Cardiac EnzymesNo results for input(s): "TROPONINI" in the last 168 hours. No results for input(s): "TROPIPOC" in the last 168 hours.   BNP Recent Labs  Lab 03/03/23 2011 03/04/23 1211  BNP 323.5* 285.8*     DDimer  Recent Labs  Lab 03/03/23 1814  DDIMER >20.00*  Radiology    No results found.  Cardiac Studies   none  Patient Profile     73 y.o. male in rehab, consulted for mild LV dysfunction and bilateral PE's.  Assessment & Plan    Acute bilateral PE's - would continue eliquis as per protocol. Chronic CHF, EF 45% - continue toprol and low dose losartan. Additional meds can be added as an outpatient.  Stroke - continue rehab and eliquis.  [facility] will sign off.   Medication Recommendations:  see abive Other recommendations (labs, testing, etc):  none Follow up as an outpatient:  Dr. Odis Hollingshead in 3-4 weeks.   For questions or updates, please contact [facility] Please consult [email or web address] for contact info under Cardiology/STEMI.      Signed, Lewayne Bunting, MD  03/07/2023, 10:05 AM

## 2023-03-08 LAB — CBC
HCT: 37.1 % — ABNORMAL LOW (ref 39.0–52.0)
Hemoglobin: 11.7 g/dL — ABNORMAL LOW (ref 13.0–17.0)
MCH: 27.6 pg (ref 26.0–34.0)
MCHC: 31.5 g/dL (ref 30.0–36.0)
MCV: 87.5 fL (ref 80.0–100.0)
Platelets: 248 10*3/uL (ref 150–400)
RBC: 4.24 MIL/uL (ref 4.22–5.81)
RDW: 14.2 % (ref 11.5–15.5)
WBC: 3.8 10*3/uL — ABNORMAL LOW (ref 4.0–10.5)
nRBC: 0 % (ref 0.0–0.2)

## 2023-03-08 MED ORDER — SODIUM CHLORIDE 0.9 % IV SOLN: Freq: Every evening | INTRAVENOUS | Status: DC | PRN

## 2023-03-08 MED ORDER — SODIUM CHLORIDE 0.9 % IV SOLN: Freq: Once | INTRAVENOUS | Status: DC

## 2023-03-08 MED ORDER — ALUM & MAG HYDROXIDE-SIMETH 200-200-20 MG/5ML PO SUSP
15.0000 mL | Freq: Four times a day (QID) | ORAL | Status: DC | PRN
  Administered 2023-03-16: 15 mL via ORAL
  Filled 2023-03-08 (×2): qty 30

## 2023-03-08 NOTE — Progress Notes (Signed)
 Physical Therapy Session Note  Patient Details  Name: Edward Rodgers MRN: [id number] Date of Birth: 1950/07/21  Today's Date: 03/08/2023 PT Individual Time: 1004-1100 PT Individual Time Calculation (min): 56 min   Short Term Goals: Week 2:  PT Short Term Goal 1 (Week 2): pt will perform bed mobility with CGA or less PT Short Term Goal 2 (Week 2): pt will ambulate 100 feet with RW and mod A or less PT Short Term Goal 3 (Week 2): pt will perform car transfer with mod A or less with LRAD  Skilled Therapeutic Interventions/Progress Updates:     Pt supine in bed upon arrival. Pt agreeable to therpa. Pt denies any pain.   Vitals: RA O2 99, HR 76, BP 144/96.    Supine to sit with mod A for trunk with bed flat and use of bed rails, verbal/tactile cues provided for UE positioning and at rib cage to facilitate core activation.   Stand pivot transfer bed to [facility] with mod A with no AD, with verbal and tactile cues provided for anterior weight shift and sequencing and therapist guarding for L LE buckling.   Did not work on gait training for safety 2/2 no +2 available during session.   Pt performed sit to stand x7 with RW and L UE splint on RW with mod A progressing to min A with repeated practice, with max instructional/questioning cues provided for UE/LE posiitoning and sequencing/anterior weight shift.   Pt reports need to use bathroom. Pt performed stand pivot transfer with R UE on grab bar and min A. Pt continent of bladder. Therapist changed brief 2/2 mild bowel incontinence. Pt performed sit to stand from toilet with min A-mod A. Pt stood with RW and min-mod A 2/2 L LE buckling, while therapist donned/doffed pants and performed pericare with total A, verbal and tactile cues proivded for upirhgt posture, hip/knee extension.   Pt seated in TIS WC at end of session with all needs within reach and bed alarm on.   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

## 2023-03-08 NOTE — Progress Notes (Signed)
 Speech Language Pathology Daily Session Note  Patient Details  Name: Edward Rodgers MRN: [id number] Date of Birth: [birth year]  Today's Date: 03/08/2023 SLP Individual Time: 0830-0913 SLP Individual Time Calculation (min): 43 min  Short Term Goals: Week 2: SLP Short Term Goal 1 (Week 2): Patient will solve basic environmental and functional problems with 80% accuracy given mod assist. SLP Short Term Goal 2 (Week 2): Patient will utilize external and internal memory aids to recall daily information with 75% accuracy given mod assist. SLP Short Term Goal 3 (Week 2): Patient will demonstrate awareness of deficits by stating 2 cognitive and 2 physical deficits when provided with mod verbal cues.  Skilled Therapeutic Interventions:   Pt seen for skilled SLP intervention to address cognitive goals. PA present briefly during session and reminding pt he needs to drink plenty of fluids to reduce need for supplemental IV fluids. SLP assisted pt with setting multiple phone alarms to go off throughout the day and remind pt to drink water. Pt participated in novel card game targeting sequencing, simple problem solving, and working memory. Pt completed game and followed rules with min verbal cues throughout. He demonstrated improved carryover of card game rules from round to round. Pt left sitting upright in bed with call bell in reach and bed alarm set. Continue SLP PoC.   Pain Pain Assessment Pain Scale: 0-10 Pain Score: 0-No pain  Therapy/Group: Individual Therapy  Ellery Plunk 03/08/2023, 9:16 AM

## 2023-03-08 NOTE — Progress Notes (Signed)
 PROGRESS NOTE   Subjective/Complaints:  Pt doing much better today, feels good, slept well, denies pain.  LBM yesterday, urinating fine, nausea has resolved as of today. Denies any other complaints or concerns.   ROS: as per HPI. Denies fever, HA, abd pain, N/V/D/C, or any other complaints at this time.   +CP-resolved + Shortness of breath-  resolved + fatigue   Objective:   No results found.  Recent Labs    03/06/23 0550 03/07/23 0940  WBC 4.5 3.9*  HGB 11.5* 11.4*  HCT 35.2* 35.7*  PLT 206 241    Recent Labs    03/07/23 0940  NA 135  K 3.7  CL 105  CO2 22  GLUCOSE 143*  BUN 18  CREATININE 0.99  CALCIUM 9.2        Intake/Output Summary (Last 24 hours) at 03/08/2023 4098 Last data filed at 03/08/2023 0600 Gross per 24 hour  Intake 298 ml  Output 300 ml  Net -2 ml         Physical Exam: Vital Signs Blood pressure (!) 153/95, pulse 77, temperature 98.3 F (36.8 C), resp. rate 17, height 5\' 9"  (1.753 m), weight 89 kg, SpO2 93%.  Constitutional: NAD, sitting up in bed, looks great HENT:     Head: Normocephalic and atraumatic.     Nose: Nose normal.     Mouth/Throat:     Mouth: Mucous membranes are moist.   Eyes:     Extraocular Movements: Extraocular movements intact.     Conjunctiva/sclera: Conjunctivae normal.     Pupils: Pupils are equal, round, and reactive to light.  Cardiovascular:     Rate and Rhythm: Normal rate and regular rhythm. No m/r/g appreciated Pulmonary:     Effort: Pulmonary effort is normal. No respiratory distress.     Breath sounds: No stridor. No wheezing or rhonchi.  Abdominal:     General: Bowel sounds are normal. There is no distension.     Palpations: Abdomen is soft.     Tenderness: There is no abdominal tenderness.  Musculoskeletal:        General: Swelling (trace LUE) present. Improving    Cervical back: Normal range of motion.      No pedal edema  appreciated  Skin:    General: Skin is warm and dry.     Findings: No bruising or erythema.  Psychiatric:     Comments: Pt pleasant and cooperative  Neuro: alert, slightly dysarthric which is baseline, no staring episodes today   PRIOR EXAMS: Neurological:     Mental Status: He is awake working with therapy, appears a little speepy    Comments: Alert and oriented x 4.  Slightly dysarthric speech.  Fair insight and awareness.  Language overall appears to be intact.  Left central 7.  Decreased sensation to light touch in left upper extremity and left lower extremity.  Moving all 4 extremities to gravity and resistance Mildly increased tone R hand.    Assessment/Plan: 1. Functional deficits which require 3+ hours per day of interdisciplinary therapy in a comprehensive inpatient rehab setting. Physiatrist is providing close team supervision and 24 hour management of active medical problems listed below. Physiatrist  and rehab team continue to assess barriers to discharge/monitor patient progress toward functional and medical goals  Care Tool:  Bathing    Body parts bathed by patient: Left arm, Chest, Abdomen, Front perineal area, Right upper leg, Left upper leg, Right lower leg, Face   Body parts bathed by helper: Right arm, Buttocks, Left lower leg     Bathing assist Assist Level: Moderate Assistance - Patient 50 - 74%     Upper Body Dressing/Undressing Upper body dressing        Upper body assist Assist Level: Moderate Assistance - Patient 50 - 74%    Lower Body Dressing/Undressing Lower body dressing            Lower body assist Assist for lower body dressing: Maximal Assistance - Patient 25 - 49%     Toileting Toileting    Toileting assist Assist for toileting: Maximal Assistance - Patient 25 - 49%     Transfers Chair/bed transfer  Transfers assist     Chair/bed transfer assist level: Moderate Assistance - Patient 50 - 74% (squat pivot/stand pivot)      Locomotion Ambulation   Ambulation assist   Ambulation activity did not occur: Safety/medical concerns  Assist level: 2 helpers (+2 for WC follow) Assistive device: Walker-rolling Max distance: 50 feet   Walk 10 feet activity   Assist  Walk 10 feet activity did not occur: Safety/medical concerns  Assist level: 2 helpers Assistive device: Walker-rolling   Walk 50 feet activity   Assist Walk 50 feet with 2 turns activity did not occur: Safety/medical concerns  Assist level: 2 helpers Assistive device: Walker-rolling    Walk 150 feet activity   Assist Walk 150 feet activity did not occur: Safety/medical concerns         Walk 10 feet on uneven surface  activity   Assist Walk 10 feet on uneven surfaces activity did not occur: Safety/medical concerns         Wheelchair     Assist Is the patient using a wheelchair?: Yes Type of Wheelchair: Manual    Wheelchair assist level: Minimal Assistance - Patient > 75% Max wheelchair distance: 50    Wheelchair 50 feet with 2 turns activity    Assist        Assist Level: Minimal Assistance - Patient > 75%   Wheelchair 150 feet activity     Assist      Assist Level: Maximal Assistance - Patient 25 - 49%   Blood pressure (!) 153/95, pulse 77, temperature 98.3 F (36.8 C), resp. rate 17, height 5\' 9"  (1.753 m), weight 89 kg, SpO2 93%.  Medical Problem List and Plan: 1. 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  Await plan for loop recorder             -patient may shower -ELOS/Goals: 21-25 days, min assist with PT, OT and supervision with SLP -Continue CIR -Expected discharge 03/14/2023 -Discontinue Bed level activities today  2.  Antithrombotics: -DVT/anticoagulation:  Pharmaceutical: eliquis per pharmacy -03/07/23 now on eliquis per pharmacy -antiplatelet therapy: Aspirin 81 mg daily and Plavix 75 mg day x 3  weeks then Plavix alone -2/20 DC aspirin and Plavix as on heparin, discussed with neurology and cardiology-- 2/22 now on eliquis  3. Pain Management: Lidoderm patch as directed, Tylenol as needed 4. Mood/Sleep/sundowning: Effexor 150 mg every evening, provide emotional support             -  antipsychotic agents: Seroquel 50 mg nighty at 2030, have back up dose             -keep sleep chart 5. Neuropsych/cognition: This patient is capable of making decisions on his own behalf. 6. Skin/Wound Care: Routine skin checks 7. Fluids/Electrolytes/Nutrition: Routine in and outs with follow-up chemistries, continue vitamins/supplements 8.  Hypothyroidism.  Synthroid 28mcg/day 9.  Hypertension. Norvasc 10 mg daily, Toprol-Xl 100 mg daily, Aldactone 25 mg daily.             -bp trending up. Likely will need additions to regimen  -02/22/23 BPs 140s/90s, not terrible, monitor trend with increase mobility  2/10 BP controlled, decrease spironolactone due to elevated CR/BUN -02/28/23 BP stable but creeping up, continue to monitor for now but may need to increase meds back -2/21-23 BP stable continue to monitor    Vitals:   03/05/23 1540 03/05/23 1943 03/06/23 0100 03/06/23 0613  BP: 137/87 (!) 156/97 128/85 134/87   03/06/23 0823 03/06/23 1314 03/07/23 0416 03/07/23 1026  BP: 134/87 (!) 143/93 (!) 165/92 (!) 125/90   03/07/23 1040 03/07/23 1556 03/07/23 1940 03/08/23 0416  BP: 118/84 135/88 139/88 (!) 153/95    10.H/O prostate cancer. Myrbetriq 50 mg daily. 11.Hyperlipidemia. Lipitor 80mg  daily 12.Constipation.             -miralax daily, SenokotS 1 tab daily -02/22/23 LBM 2days ago, monitor today, if no BM tomorrow then may want to increase regimen -03/08/23 LBM yesterday, cont regimen 13.H/O gout. Zyloprim 300mg  daily 14.  AKI versus CKD  -Encourage oral fluid intake, decrease spironolactone to 12.5 mg  -2/11 recheck BMP today  Addendum- increased Cr, stop spironolactone, start IVF NS 35ml/HR  -2/12  Cr slightly improved, continue IVF, encourage oral fluids  -2/13 recheck BMP- CR down to 1.2  -2/14 DC IVF, discussed oral hydration  2/17 BUN/CR stable 17/1.25  -2/18 continue to encourage oral hydration -2/20 BUN and creatinine improved to 21/1.25, got IV fluids last night 46ml/hr   2/21 Encourage PO fluid intake  -03/07/23 Cr 0.99 today, poor PO intake, a little dizzy this morning, will give IVFs @50ml /hr x12hrs now, and have standing PRN order for 52ml/hr x12hrs at bedtime if PO intake <1246ml/day -03/08/23 doing much better today after IVFs; leave standing order on for now, but encouraged pt to drink water throughout the day  15.  History of CHF.  Last EF 55%, mild diastolic dysfunction  -Continue metoprolol, decrease spironolactone to 12.5 mg as above -2/14 Wts stable, has not appear to show signs of fluid overload, continue to monitor -02/28/23 wt up but doubt accuracy, monitor, no overload on exam -2/19-20 weight stable, does not appear to show signs of fluid overload, EF 40 to 45% on echo. -Cardiology has been following, they discontinued amlodipine and started losartan 25 mg daily-beginning tomorrow-- signing off 2/22 (see recs below)  -Follow-up with cardiology at the [facility] postdischarge  Filed Weights   03/06/23 1100 03/07/23 0419 03/08/23 0418  Weight: 89.9 kg 88.8 kg 89 kg    16.  Severe cervical stenosis: Consider neurosurgery outpatient follow-up 17. Insomnia -Patient has Seroquel 50 mg scheduled and 25 mg as needed.  Will also add in 3 mg melatonin at bedtime  -2/11 increase Seroquel to 75 mg nightly  -2/13 asked nursing to complete sleep-wake chart  -2/14 reports sleeping better, continue current regimen for now and monitor  -2/20 will decrease Seroquel back to 50 mg nightly and 25 mg at bedtime  2/21 Decrease seroquel to  25mg  nightly and 25mg  PRN  18.  Bleeding from his penis  -UA neg,HGB was stable on 2/11 -Denies additional occurrences.  During team meeting was noted  this happened intermittently during acute hospital stay  19. Nasal congestion: Flonase 1 spray daily  20. Staring episodes/vision changes 03/01/23 -03/01/23 having staring episodes and ?chronic vision changes/hemianopsia? Spoke with Dr. Derry Lory of neuro, will get 24hr EEG and head CT, no code stroke at this point; they will see pt, appreciate their assistance.  -2/17 overnight EEG neg, CT head without acute changes  21. Chest pain  -EKG, Trop, CXR ordered  -Cardiology following appreciate assistance.  D-dimer positive, getting CT PE study to rule out PE.  If positive will need to discuss with neurology regarding anticoagulation.  He is now on 2 L nasal cannula oxygen  -2/20 patient reports his chest pain is resolved  22. Pulmonary embolism / DVT left popliteal vein -2/19 discussed with neurology and cardiology. Heparin grip started, wife updated by phone. Critical care medicine consulted 2/20 seen by critical care, continue heparin eventual transition to DOAC, if hemodynamic changes contact PCCM again. Advised 24h bedrest with [facility] 2/21 plan to transition to DOAC later today, discussed with pharmacy by phone  -03/07/23 cardiology saw pt, recommend eliquis per protolocol, continue toprol/low dose losartan, and they signed off-- f/up in 3-4wks with Dr. Odis Hollingshead  LOS: 15 days A FACE TO [facility] EVALUATION WAS PERFORMED  [address] 03/08/2023, 8:22 AM

## 2023-03-08 NOTE — Progress Notes (Signed)
 Occupational Therapy Weekly Progress Note  Patient Details  Name: Edward Rodgers MRN: [id number] Date of Birth: January 10, 1951  Beginning of progress report period: February 28, 2023 End of progress report period: March 09, 2023  {CHL IP REHAB OT TIME CALCULATIONS:304400400}   Patient has met {number 1-5:22450} of {number 1-5:20334} short term goals.  ***  Patient continues to demonstrate the following deficits: {impairments:3041632} and therefore will continue to benefit from skilled OT intervention to enhance overall performance with {ADL/iADL:3041649}.  Patient {LTG progression:3041653}.  {plan of VHQI:6962952}  OT Short Term Goals {OT WUX:3244010}  Skilled Therapeutic Interventions/Progress Updates:  Pt greeted *** for skilled OT session with focus on ***.   Pain: Pt reported ***/10 pain, stating "***" in reference to ***. OT offering intermediate rest breaks and positioning suggestions throughout session to address pain/fatigue and maximize participation/safety in session.   Functional Transfers:  Self Care Tasks: Pt completes the following self care tasks with levels of assistance noted below, UB: LB:   Therapeutic Activities:  Therapeutic Exercise:   Education:  Pt remained *** with 4Ps assessed and immediate needs met. Pt continues to be appropriate for skilled OT intervention to promote further functional independence in ADLs/IADLs.    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 Restrictions Weight Bearing Restrictions Per Provider Order: No   Therapy/Group: Individual Therapy  Lou Cal, OTR/L, MSOT  03/08/2023, 9:26 PM

## 2023-03-09 ENCOUNTER — Inpatient Hospital Stay (HOSPITAL_COMMUNITY): Payer: Medicare Other

## 2023-03-09 DIAGNOSIS — D72823 Leukemoid reaction: Secondary | ICD-10-CM

## 2023-03-09 LAB — URINALYSIS, W/ REFLEX TO CULTURE (INFECTION SUSPECTED)
Bilirubin Urine: NEGATIVE
Glucose, UA: NEGATIVE mg/dL
Ketones, ur: NEGATIVE mg/dL
Leukocytes,Ua: NEGATIVE
Nitrite: NEGATIVE
Protein, ur: 30 mg/dL — AB
Specific Gravity, Urine: 1.021 (ref 1.005–1.030)
pH: 5 (ref 5.0–8.0)

## 2023-03-09 LAB — CBC
HCT: 36 % — ABNORMAL LOW (ref 39.0–52.0)
Hemoglobin: 11.6 g/dL — ABNORMAL LOW (ref 13.0–17.0)
MCH: 28.2 pg (ref 26.0–34.0)
MCHC: 32.2 g/dL (ref 30.0–36.0)
MCV: 87.4 fL (ref 80.0–100.0)
Platelets: 217 10*3/uL (ref 150–400)
RBC: 4.12 MIL/uL — ABNORMAL LOW (ref 4.22–5.81)
RDW: 14.2 % (ref 11.5–15.5)
WBC: 13.2 10*3/uL — ABNORMAL HIGH (ref 4.0–10.5)
nRBC: 0 % (ref 0.0–0.2)

## 2023-03-09 LAB — BASIC METABOLIC PANEL
Anion gap: 9 (ref 5–15)
BUN: 18 mg/dL (ref 8–23)
CO2: 22 mmol/L (ref 22–32)
Calcium: 9.5 mg/dL (ref 8.9–10.3)
Chloride: 103 mmol/L (ref 98–111)
Creatinine, Ser: 1.25 mg/dL — ABNORMAL HIGH (ref 0.61–1.24)
GFR, Estimated: >60 mL/min (ref >60)
Glucose, Bld: 106 mg/dL — ABNORMAL HIGH (ref 70–99)
Potassium: 3.5 mmol/L (ref 3.5–5.1)
Sodium: 134 mmol/L — ABNORMAL LOW (ref 135–145)

## 2023-03-09 LAB — LACTIC ACID, PLASMA
Lactic Acid, Venous: 1.2 mmol/L (ref 0.5–1.9)
Lactic Acid, Venous: 1.2 mmol/L (ref 0.5–1.9)

## 2023-03-09 LAB — PROCALCITONIN: Procalcitonin: 57.49 ng/mL

## 2023-03-09 MED ORDER — SODIUM CHLORIDE 0.9 % IV SOLN: Freq: Every evening | INTRAVENOUS | Status: AC | PRN

## 2023-03-09 NOTE — Progress Notes (Signed)
 Speech Language Pathology Weekly Progress and Session Note  Patient Details  Name: Edward Rodgers MRN: [id number] Date of Birth: [birth year]  Beginning of progress report period: March 02, 2023 End of progress report period: March 09, 2023  Today's Date: 03/09/2023 SLP Individual Time: 0807-0900 and 1450-1516 SLP Individual Time Calculation (min): 53 min And 26 min  Short Term Goals: Week 2: SLP Short Term Goal 1 (Week 2): Patient will solve basic environmental and functional problems with 80% accuracy given mod assist. SLP Short Term Goal 1 - Progress (Week 2): Met SLP Short Term Goal 2 (Week 2): Patient will utilize external and internal memory aids to recall daily information with 75% accuracy given mod assist. SLP Short Term Goal 2 - Progress (Week 2): Met SLP Short Term Goal 3 (Week 2): Patient will demonstrate awareness of deficits by stating 2 cognitive and 2 physical deficits when provided with mod verbal cues. SLP Short Term Goal 3 - Progress (Week 2): Not met  New Short Term Goals: Week 3: SLP Short Term Goal 1 (Week 3): STGs=LTGs d/t ELOS  Weekly Progress Updates: Patient continues to make steady progress towards therapy goals, meeting 2/3 short term goals set this reporting period. Patient currently benefits from mod assist to solve basic environmental and functional problems and mod assist to utilize external and internal memory aids to recall daily information with 75% accuracy. Overall, patient is approaching goal of increasing intellectual awareness of deficits, however at most recent session required max assist to state cognitive and physical goals and sustain attention to conversation. Patient and family education ongoing. Patient will continue to benefit from skilled therapy services during remainder of CIR stay.    Intensity: Minumum of 1-2 x/day, 30 to 90 minutes Frequency: 3 to 5 out of 7 days Duration/Length of Stay: 3/1 Treatment/Interventions: Cognitive  remediation/compensation;Environmental controls;Cueing hierarchy;Functional tasks;Therapeutic Activities;Internal/external aids;Patient/family education  Daily Session  Skilled Therapeutic Interventions:   Session 1: SLP conducted skilled therapy session targeting cognitive retraining goals. Upon SLP entry, patient perseverative about getting out of bed to make a pot of coffee, though did not physically attempt. SLP and nursing team obtained coffee for patient, however even after getting coffee, patient continuing to perseverate on making coffee. Patient is oriented to place but disoriented to daily situation and date, requiring mod cues to utilize calendar to orient. With SLP guidance, patient recalled specific events from the weekend with overall mod assist and sustained attention to conversation with mod verbal cues for 10 minutes. Patient reports that he does not utilize memory book regularly. Updated memory book and encouraged ongoing use. This date, patient exhibits much more difficult time sustaining attention to therapy tasks, requiring max assist as session progresses and required max assist to state physical and cognitive deficits. Patient appeared more internally distracted than previous sessions, however unable to determine root of distractions. Patient was left in lowered bed with call bell in reach and bed alarm set. SLP will continue to target goals per plan of care.       Session 2: SLP conducted skilled therapy session targeting cognitive retraining goals. Upon entry to room, patient visited by brother who reports "patient is not doing well today." SLP noted reduced capacity for sustained attention at this morning's session compared to previous, however patient's presentation at the afternoon session was significantly impaired compared to the morning. Patient required total assist for sustained attention for brief periods of time. Difficulty following 1-step directions and just repeating "I  feel so sick." Throughout  session, patient required total assist for recall of therapy events from the morning. Due to change in status and inability to fully engage in therapy tasks, session truncated by 16 minutes. Nursing staff entered to take vitals. MD alerted to changes in status observed between morning and afternoon sessions.    Pain Session 1: Pain Assessment Pain Scale: Faces Faces Pain Scale: Hurts a little bit Pain Location: Head Session 2: None reported   Therapy/Group: Individual Therapy  [name], M.A., CCC-SLP  [name] 03/09/2023, 9:43 AM

## 2023-03-09 NOTE — Progress Notes (Signed)
 PROGRESS NOTE   Subjective/Complaints:  Pt working with SLP on orientation, calendar. He was holding a calendar of February with days crossed out up until today. He denies any problems overnight but wonders why it's hard for him to process sometimes. SLP feels he's a little slower today.   ROS: Patient denies fever, rash, sore throat, blurred vision, dizziness, nausea, vomiting, diarrhea, cough, shortness of breath or chest pain, joint or back/neck pain, headache, or mood change.   Objective:   No results found.  Recent Labs    03/08/23 0902 03/09/23 0611  WBC 3.8* 13.2*  HGB 11.7* 11.6*  HCT 37.1* 36.0*  PLT 248 217    Recent Labs    03/07/23 0940 03/09/23 0611  NA 135 134*  K 3.7 3.5  CL 105 103  CO2 22 22  GLUCOSE 143* 106*  BUN 18 18  CREATININE 0.99 1.25*  CALCIUM 9.2 9.5        Intake/Output Summary (Last 24 hours) at 03/09/2023 0949 Last data filed at 03/09/2023 0737 Gross per 24 hour  Intake 75 ml  Output 300 ml  Net -225 ml         Physical Exam: Vital Signs Blood pressure (!) 143/88, pulse 75, temperature 97.9 F (36.6 C), resp. rate 18, height 5\' 9"  (1.753 m), weight 89.3 kg, SpO2 93%.  Constitutional: No distress . Vital signs reviewed. HEENT: NCAT, EOMI, oral membranes moist Neck: supple Cardiovascular: RRR without murmur. No JVD    Respiratory/Chest: CTA Bilaterally without wheezes or rales. Normal effort    GI/Abdomen: BS +, non-tender, non-distended Ext: no clubbing, cyanosis, or edema Psych: pleasant and cooperative, a little flat Skin:clean and dry Neuro:     He is awake working with therapy. Oriented to month and year (holding calendar) could not determine day until cued. Knew he was at hospital for stroke. Slow to process.. language normal.  Slightly dysarthric speech.  Fair insight and awareness.  Language overall appears to be intact.  Left central 7.  Decreased sensation to  light touch in left upper extremity and left lower extremity.  Moving all 4 extremities to gravity and resistance Mildly increased tone R hand present    Assessment/Plan: 1. Functional deficits which require 3+ hours per day of interdisciplinary therapy in a comprehensive inpatient rehab setting. Physiatrist is providing close team supervision and 24 hour management of active medical problems listed below. Physiatrist and rehab team continue to assess barriers to discharge/monitor patient progress toward functional and medical goals  Care Tool:  Bathing    Body parts bathed by patient: Left arm, Chest, Abdomen, Front perineal area, Right upper leg, Left upper leg, Right lower leg, Face   Body parts bathed by helper: Right arm, Buttocks, Left lower leg     Bathing assist Assist Level: Moderate Assistance - Patient 50 - 74%     Upper Body Dressing/Undressing Upper body dressing        Upper body assist Assist Level: Moderate Assistance - Patient 50 - 74%    Lower Body Dressing/Undressing Lower body dressing            Lower body assist Assist for lower body dressing: Maximal  Assistance - Patient 25 - 49%     Toileting Toileting    Toileting assist Assist for toileting: Maximal Assistance - Patient 25 - 49%     Transfers Chair/bed transfer  Transfers assist     Chair/bed transfer assist level: Moderate Assistance - Patient 50 - 74% (squat pivot/stand pivot)     Locomotion Ambulation   Ambulation assist   Ambulation activity did not occur: Safety/medical concerns  Assist level: 2 helpers (+2 for WC follow) Assistive device: Walker-rolling Max distance: 50 feet   Walk 10 feet activity   Assist  Walk 10 feet activity did not occur: Safety/medical concerns  Assist level: 2 helpers Assistive device: Walker-rolling   Walk 50 feet activity   Assist Walk 50 feet with 2 turns activity did not occur: Safety/medical concerns  Assist level: 2  helpers Assistive device: Walker-rolling    Walk 150 feet activity   Assist Walk 150 feet activity did not occur: Safety/medical concerns         Walk 10 feet on uneven surface  activity   Assist Walk 10 feet on uneven surfaces activity did not occur: Safety/medical concerns         Wheelchair     Assist Is the patient using a wheelchair?: Yes Type of Wheelchair: Manual    Wheelchair assist level: Minimal Assistance - Patient > 75% Max wheelchair distance: 50    Wheelchair 50 feet with 2 turns activity    Assist        Assist Level: Minimal Assistance - Patient > 75%   Wheelchair 150 feet activity     Assist      Assist Level: Maximal Assistance - Patient 25 - 49%   Blood pressure (!) 143/88, pulse 75, temperature 97.9 F (36.6 C), resp. rate 18, height 5\' 9"  (1.753 m), weight 89.3 kg, SpO2 93%.  Medical Problem List and Plan: 1. 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  Await plan for loop recorder             -patient may shower -ELOS/Goals: 21-25 days, min assist with PT, OT and supervision with SLP -Expected discharge 03/14/2023 -Continue CIR therapies including PT, OT, and SLP   2.  Antithrombotics: -DVT/anticoagulation:  Pharmaceutical: eliquis per pharmacy -03/07/23 now on eliquis per pharmacy -antiplatelet therapy: Aspirin 81 mg daily and Plavix 75 mg day x 3 weeks then Plavix alone -2/20 DC aspirin and Plavix as on heparin, discussed with neurology and cardiology-- 2/22 now on eliquis  3. Pain Management: Lidoderm patch as directed, Tylenol as needed 4. Mood/Sleep/sundowning: Effexor 150 mg every evening, provide emotional support             -antipsychotic agents: Seroquel 50 mg nighty at 2030, have back up dose             -keep sleep chart 5. Neuropsych/cognition: This patient is capable of making decisions on his own behalf. 6. Skin/Wound Care:  Routine skin checks 7. Fluids/Electrolytes/Nutrition: Routine in and outs with follow-up chemistries, continue vitamins/supplements 8.  Hypothyroidism.  Synthroid 1mcg/day 9.  Hypertension. Norvasc 10 mg daily, Toprol-Xl 100 mg daily, Aldactone 25 mg daily.             -bp trending up. Likely will need additions to regimen  -02/22/23 BPs 140s/90s, not terrible, monitor trend with increase mobility  2/10 BP controlled, decrease spironolactone due to elevated CR/BUN -02/28/23 BP stable but creeping up, continue to  monitor for now but may need to increase meds back -2/24 bp borderline to stable. Consider adjusting regimen  Vitals:   03/06/23 0100 03/06/23 0613 03/06/23 0823 03/06/23 1314  BP: 128/85 134/87 134/87 (!) 143/93   03/07/23 0416 03/07/23 1026 03/07/23 1040 03/07/23 1556  BP: (!) 165/92 (!) 125/90 118/84 135/88   03/07/23 1940 03/08/23 0416 03/08/23 1505 03/09/23 0449  BP: 139/88 (!) 153/95 (!) 150/97 (!) 143/88    10.H/O prostate cancer. Myrbetriq 50 mg daily. 11.Hyperlipidemia. Lipitor 80mg  daily 12.Constipation.             -miralax daily, SenokotS 1 tab daily -02/22/23 LBM 2days ago, monitor today, if no BM tomorrow then may want to increase regimen -03/08/23 LBM-- cont regimen 13.H/O gout. Zyloprim 300mg  daily 14.  AKI versus CKD  -Encourage oral fluid intake, decrease spironolactone to 12.5 mg  -2/11 recheck BMP today  Addendum- increased Cr, stop spironolactone, start IVF NS 24ml/HR  -2/12 Cr slightly improved, continue IVF, encourage oral fluids  -2/13 recheck BMP- CR down to 1.2  -2/14 DC IVF, discussed oral hydration  2/17 BUN/CR stable 17/1.25  -2/18 continue to encourage oral hydration -2/20 BUN and creatinine improved to 21/1.25, got IV fluids last night 62ml/hr   2/21 Encourage PO fluid intake  -03/07/23 Cr 0.99 today, poor PO intake, a little dizzy this morning, will give IVFs @50ml /hr x12hrs now, and have standing PRN order for 51ml/hr x12hrs at bedtime if PO  intake <129ml/day -03/08/23 doing much better today after IVFs; leave standing order on for now, but encouraged pt to drink water throughout the day  2/24---give additional IVF today as Cr sl up 15.  History of CHF.  Last EF 55%, mild diastolic dysfunction  -Continue metoprolol, decrease spironolactone to 12.5 mg as above -2/14 Wts stable, has not appear to show signs of fluid overload, continue to monitor -02/28/23 wt up but doubt accuracy, monitor, no overload on exam -2/19-20 weight stable, does not appear to show signs of fluid overload, EF 40 to 45% on echo. -Cardiology has been following, they discontinued amlodipine and started losartan 25 mg daily-beginning tomorrow-- signing off 2/22 (see recs below)  -[facility]  -2/24 weights stable Filed Weights   03/07/23 0419 03/08/23 0418 03/09/23 0500  Weight: 88.8 kg 89 kg 89.3 kg    16.  Severe cervical stenosis: Consider neurosurgery outpatient follow-up 17. Insomnia -Patient has Seroquel 50 mg scheduled and 25 mg as needed.  Will also add in 3 mg melatonin at bedtime  -2/11 increase Seroquel to 75 mg nightly  -2/13 asked nursing to complete sleep-wake chart  -2/14 reports sleeping better, continue current regimen for now and monitor  -2/20 will decrease Seroquel back to 50 mg nightly and 25 mg at bedtime  2/21 Decrease seroquel to 25mg  nightly and 25mg  PRN  18.  Bleeding from his penis  -UA neg,HGB was stable on 2/11 -Denies additional occurrences.  During team meeting was noted this happened intermittently during acute hospital stay  19. Nasal congestion: Flonase 1 spray daily  20. Staring episodes/vision changes 03/01/23 -03/01/23 having staring episodes and ?chronic vision changes/hemianopsia? Spoke with Dr. Derry Lory of neuro, will get 24hr EEG and head CT, no code stroke at this point; they will see pt, appreciate their assistance.  -2/17 overnight EEG neg, CT head without acute  changes  21. Chest pain  -EKG, Trop, CXR ordered  -Cardiology following appreciate assistance.  D-dimer positive, getting CT PE study to rule  out PE.  If positive will need to discuss with neurology regarding anticoagulation.  He is now on 2 L nasal cannula oxygen  -2/20 patient reports his chest pain is resolved  22. Pulmonary embolism / DVT left popliteal vein -2/19 discussed with neurology and cardiology. Heparin grip started, wife updated by phone. Critical care medicine consulted 2/20 seen by critical care, continue heparin eventual transition to DOAC, if hemodynamic changes contact PCCM again. Advised 24h bedrest with [facility] 2/21 plan to transition to DOAC later today, discussed with pharmacy by phone  -03/07/23 cardiology saw pt, recommend eliquis per protolocol, continue toprol/low dose losartan, and they signed off-- f/up in 3-4wks with Dr. Odis Hollingshead  23. Leukocytosis: 13k today  -SLP felt that he was having more difficulties from a cognitive standpoint today  -he is afebrile. No cough or respiratory sx  -will check UA, UCX  -recheck CBC in AM  -consider CXR if he spikes a temp   LOS: 16 days A FACE TO FACE EVALUATION WAS PERFORMED  Ranelle Oyster 03/09/2023, 9:49 AM

## 2023-03-09 NOTE — Progress Notes (Signed)
 Physical Therapy Weekly Progress Note  Patient Details  Name: Edward Rodgers MRN: [id number] Date of Birth: 05-28-1950  Beginning of progress report period: March 02, 2023 End of progress report period: March 09, 2023  Today's Date: 03/09/2023 PT Individual Time: 7829-5621 PT Individual Time Calculation (min): 30 min  and Today's Date: 03/09/2023 PT Missed Time: 15 Minutes Missed Time Reason: Patient fatigue (pt asleep/drowsiness)  Patient has met 0 of 3 short term goals.  Pt has had limited progress towards goals this week 2/2 to DVT in L LE and PE resulting in bed rest/and bed level activity only for majority of the week.  Pt level of assist fluctuates between +1 min A and +2 total A 2/2 L hemi, L inattention, impaired attention/drowsiness, L LE buckling. WC consults scheduled with [facility] for Tuesday 2/25 at 2:00 for TIS WC. Discharge currently scheduled for 3/1. Pt would benefit from extended LOS for extensive family training. Anticipating pt to need 24/7 assistance upon discharge.     Patient continues to demonstrate the following deficits muscle weakness, decreased cardiorespiratoy endurance, impaired timing and sequencing, unbalanced muscle activation, decreased coordination, and decreased motor planning, decreased midline orientation, decreased attention to left, left side neglect, and decreased motor planning, decreased initiation, decreased attention, decreased awareness, decreased problem solving, decreased safety awareness, decreased memory, and delayed processing, and decreased sitting balance, decreased standing balance, decreased postural control, and hemiplegia and therefore will continue to benefit from skilled PT intervention to increase functional independence with mobility.  Patient not progressing toward long term goals.  See goal revision..  Plan of care revisions: 03/09/23.  PT Short Term Goals Week 2:  PT Short Term Goal 1 (Week 2): pt will perform bed mobility  with CGA or less PT Short Term Goal 1 - Progress (Week 2): Progressing toward goal PT Short Term Goal 2 (Week 2): pt will ambulate 100 feet with RW and mod A or less PT Short Term Goal 2 - Progress (Week 2): Partly met PT Short Term Goal 3 (Week 2): pt will perform car transfer with mod A or less with LRAD PT Short Term Goal 3 - Progress (Week 2): Not progressing Week 3:  PT Short Term Goal 1 (Week 3): Pt will perform WC consult for TIS WC PT Short Term Goal 2 (Week 3): therapist will initiate hands on family training PT Short Term Goal 3 (Week 3): Pt will perform bed to chair transfer with mod A or less consistently PT Short Term Goal 4 (Week 3): pt will perform sit to stand with mod A or less consistently  Skilled Therapeutic Interventions/Progress Updates:      Pt asleep in TIS WC upon arrival. Pt awoken and agreeable to therapy. Pt denies any pain. Pt very drowsy throughout session, pt awoken however therapist having difficulty sustaining pt attention.   Therapist tilted chair forward and brought pt to car simulator to practice car transfer and hopefully peak pt interest. Pt able to tell me make and model of car, then quickly falling asleep again. Therapist not assisting with raising either leg to donn/doff leg rests/shoes.   Therapist opted to transfer pt back to bed for safety 2/2 drowsiness/difficulty sustaining attention and overall decreased participation.   Pt performed squat pivot transfer with +2 total A. Sit to supine with total A, with max verbal/tactile cues provided for initiation and participation.   Nurse and nurse coordination in room. Pt A&O to self only. Pt vitals: 126/91 HR 91 RA O2 96.  Notified IDT via secure chat about overall change in status between today and yesterday.   Pt supine asleep in bed with all needs within reach and bed alarm on.     Therapy Documentation Precautions:  Precautions Precautions: Fall Precaution/Restrictions Comments: L-hemi,  L-inattention, pusher syndrome, Loop recorder placed 2/7 Restrictions Weight Bearing Restrictions Per Provider Order: No  Therapy/Group: Individual Therapy  [facility] [place], [place], DPT  03/09/2023, 7:42 AM

## 2023-03-09 NOTE — Plan of Care (Signed)
 Discontinued home ambulation, and stair navigation goal as not anticipating pt to be functional ambulator upon discharge. Discontinued WC goal, as pt will need TIS WC upon discharge for safety. Downgraded sit to stand and controlled ambulation goal as pt progressing slower than anticipated and pt CLOF inconsistent.  Problem: Sit to Stand Goal: LTG:  Patient will perform sit to stand with assistance level (PT) Description: LTG:  Patient will perform sit to stand with assistance level (PT) Flowsheets (Taken 03/09/2023 1256) LTG: PT will perform sit to stand in preparation for functional mobility with assistance level: Minimal Assistance - Patient > 75%   Problem: RH Ambulation Goal: LTG Patient will ambulate in controlled environment (PT) Description: LTG: Patient will ambulate in a controlled environment, # of feet with assistance (PT). Flowsheets (Taken 03/09/2023 1256) LTG: Pt will ambulate in controlled environ  assist needed:: (downgraded pt ambulation goal 2/2 pt progressing slower than anticipated) Moderate Assistance - Patient 50 - 74% LTG: Ambulation distance in controlled environment: 50 feet with LRAD   Problem: RH Ambulation Goal: LTG Patient will ambulate in home environment (PT) Description: LTG: Patient will ambulate in home environment, # of feet with assistance (PT). Outcome: Not Applicable Flowsheets (Taken 03/09/2023 1256) LTG: Pt will ambulate in home environ  assist needed:: (discontinued home ambulation goal, as not anticipating pt to be functional ambulator upon discharge) --   Problem: RH Wheelchair Mobility Goal: LTG Patient will propel w/c in controlled environment (PT) Description: LTG: Patient will propel wheelchair in controlled environment, # of feet with assist (PT) Outcome: Not Applicable Flowsheets (Taken 03/09/2023 1256) LTG: Pt will propel w/c in controlled environ  assist needed:: (discontinued goal as therapist anticipating pt to need TIS WC upon discharge)  -- Goal: LTG Patient will propel w/c in home environment (PT) Description: LTG: Patient will propel wheelchair in home environment, # of feet with assistance (PT). Outcome: Not Applicable Flowsheets (Taken 03/09/2023 1256) LTG: Pt will propel w/c in home environ  assist needed:: (discontinued goal as therapist anticipating pt to need TIS WC upon discharge) --   Problem: RH Stairs Goal: LTG Patient will ambulate up and down stairs w/assist (PT) Description: LTG: Patient will ambulate up and down # of stairs with assistance (PT) Outcome: Not Applicable Flowsheets (Taken 03/09/2023 1256) LTG: Pt will ambulate up/down stairs assist needed:: (discontinued goal for safety as therapist not anticipating pt to be functional ambulator upon discharge) --

## 2023-03-10 LAB — CBC
HCT: 33.2 % — ABNORMAL LOW (ref 39.0–52.0)
Hemoglobin: 10.9 g/dL — ABNORMAL LOW (ref 13.0–17.0)
MCH: 28.2 pg (ref 26.0–34.0)
MCHC: 32.8 g/dL (ref 30.0–36.0)
MCV: 85.8 fL (ref 80.0–100.0)
Platelets: 180 10*3/uL (ref 150–400)
RBC: 3.87 MIL/uL — ABNORMAL LOW (ref 4.22–5.81)
RDW: 14.3 % (ref 11.5–15.5)
WBC: 7.5 10*3/uL (ref 4.0–10.5)
nRBC: 0 % (ref 0.0–0.2)

## 2023-03-10 LAB — BASIC METABOLIC PANEL
Anion gap: 5 (ref 5–15)
BUN: 23 mg/dL (ref 8–23)
CO2: 21 mmol/L — ABNORMAL LOW (ref 22–32)
Calcium: 8.8 mg/dL — ABNORMAL LOW (ref 8.9–10.3)
Chloride: 105 mmol/L (ref 98–111)
Creatinine, Ser: 1.54 mg/dL — ABNORMAL HIGH (ref 0.61–1.24)
GFR, Estimated: 48 mL/min — ABNORMAL LOW (ref >60)
Glucose, Bld: 102 mg/dL — ABNORMAL HIGH (ref 70–99)
Potassium: 3.6 mmol/L (ref 3.5–5.1)
Sodium: 131 mmol/L — ABNORMAL LOW (ref 135–145)

## 2023-03-10 MED ORDER — QUETIAPINE FUMARATE 50 MG PO TABS
50.0000 mg | ORAL_TABLET | Freq: Every day | ORAL | Status: DC
  Administered 2023-03-10 – 2023-03-27 (×18): 50 mg via ORAL
  Filled 2023-03-10 (×18): qty 1

## 2023-03-10 NOTE — Progress Notes (Signed)
 PROGRESS NOTE   Subjective/Complaints:  Patient had workup with UA, chest x-ray yesterday, labs after he was more lethargic yesterday.  Patient awake and alert this morning.  His wife reports she was with him and he is not sleeping well at night.  Patient denies any pain this morning.  ROS: Patient denies fever, new vision changes, dizziness, nausea, vomiting, diarrhea,  shortness of breath or chest pain, headache, or mood change.   Objective:   DG Chest Port 1 View Result Date: 03/09/2023 CLINICAL DATA:  Delirium, leukocytosis, history of pulmonary emboli EXAM: PORTABLE CHEST 1 VIEW COMPARISON:  03/03/2023 FINDINGS: Single frontal view of the chest was obtained with the patient rotated toward the right. Loop recorder is again noted. The cardiac silhouette is stable, with persistent ectasia and atherosclerosis of the thoracic aorta. Increased pulmonary vascular congestion. No acute airspace disease, effusion, or pneumothorax. IMPRESSION: 1. Pulmonary vascular congestion.  No acute airspace disease. Electronically Signed   By: Sharlet Salina M.D.   On: 03/09/2023 16:36    Recent Labs    03/09/23 0611 03/10/23 0513  WBC 13.2* 7.5  HGB 11.6* 10.9*  HCT 36.0* 33.2*  PLT 217 180    Recent Labs    03/09/23 0611 03/10/23 0513  NA 134* 131*  K 3.5 3.6  CL 103 105  CO2 22 21*  GLUCOSE 106* 102*  BUN 18 23  CREATININE 1.25* 1.54*  CALCIUM 9.5 8.8*        Intake/Output Summary (Last 24 hours) at 03/10/2023 1043 Last data filed at 03/10/2023 1012 Gross per 24 hour  Intake 786 ml  Output 20 ml  Net 766 ml         Physical Exam: Vital Signs Blood pressure 122/70, pulse 86, temperature 97.8 F (36.6 C), resp. rate 17, height 5\' 9"  (1.753 m), weight 87.7 kg, SpO2 96%.  Constitutional: No distress . Vital signs reviewed. HEENT: NCAT, EOMI, oral membranes moist Neck: supple Cardiovascular: RRR without murmur. No JVD     Respiratory/Chest: CTA Bilaterally without wheezes or rales. Normal effort    GI/Abdomen: BS +, non-tender, non-distended Ext: no clubbing, cyanosis, or edema Psych: pleasant and cooperative, a little flat Skin:clean and dry Neuro:     He is awake and alert working with therapy.  Oriented to month, day, hospital, self.  Mildly slow to process.  Language normal.  Slightly dysarthric speech.  Fair insight and awareness.  Language overall appears to be intact.  Left central 7.  Decreased sensation to light touch in left upper extremity and left lower extremity.  Moving all 4 extremities to gravity and resistance Mildly increased tone R hand present  -Overall alertness and cognition appears stable from last week   Assessment/Plan: 1. Functional deficits which require 3+ hours per day of interdisciplinary therapy in a comprehensive inpatient rehab setting. Physiatrist is providing close team supervision and 24 hour management of active medical problems listed below. Physiatrist and rehab team continue to assess barriers to discharge/monitor patient progress toward functional and medical goals  Care Tool:  Bathing    Body parts bathed by patient: Left arm, Chest, Abdomen, Front perineal area, Right upper leg, Left upper leg, Right  lower leg, Face   Body parts bathed by helper: Right arm, Buttocks, Left lower leg     Bathing assist Assist Level: Moderate Assistance - Patient 50 - 74%     Upper Body Dressing/Undressing Upper body dressing        Upper body assist Assist Level: Moderate Assistance - Patient 50 - 74%    Lower Body Dressing/Undressing Lower body dressing            Lower body assist Assist for lower body dressing: Maximal Assistance - Patient 25 - 49%     Toileting Toileting    Toileting assist Assist for toileting: Maximal Assistance - Patient 25 - 49%     Transfers Chair/bed transfer  Transfers assist     Chair/bed transfer assist level: Moderate  Assistance - Patient 50 - 74% (squat pivot/stand pivot)     Locomotion Ambulation   Ambulation assist   Ambulation activity did not occur: Safety/medical concerns  Assist level: 2 helpers (+2 for WC follow) Assistive device: Walker-rolling Max distance: 50 feet   Walk 10 feet activity   Assist  Walk 10 feet activity did not occur: Safety/medical concerns  Assist level: 2 helpers Assistive device: Walker-rolling   Walk 50 feet activity   Assist Walk 50 feet with 2 turns activity did not occur: Safety/medical concerns  Assist level: 2 helpers Assistive device: Walker-rolling    Walk 150 feet activity   Assist Walk 150 feet activity did not occur: Safety/medical concerns         Walk 10 feet on uneven surface  activity   Assist Walk 10 feet on uneven surfaces activity did not occur: Safety/medical concerns         Wheelchair     Assist Is the patient using a wheelchair?: Yes Type of Wheelchair: Manual    Wheelchair assist level: Minimal Assistance - Patient > 75% Max wheelchair distance: 50    Wheelchair 50 feet with 2 turns activity    Assist        Assist Level: Minimal Assistance - Patient > 75%   Wheelchair 150 feet activity     Assist      Assist Level: Maximal Assistance - Patient 25 - 49%   Blood pressure 122/70, pulse 86, temperature 97.8 F (36.6 C), resp. rate 17, height 5\' 9"  (1.753 m), weight 87.7 kg, SpO2 96%.  Medical Problem List and Plan: 1. 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  Await plan for loop recorder             -patient may shower -ELOS/Goals: 21-25 days, min assist with PT, OT and supervision with SLP -Expected discharge 03/14/2023 -Continue CIR therapies including PT, OT, and SLP   2.  Antithrombotics: -DVT/anticoagulation:  Pharmaceutical: eliquis per pharmacy -03/07/23 now on eliquis per pharmacy -antiplatelet  therapy: Aspirin 81 mg daily and Plavix 75 mg day x 3 weeks then Plavix alone -2/20 DC aspirin and Plavix as on heparin, discussed with neurology and cardiology-- 2/22 now on eliquis  3. Pain Management: Lidoderm patch as directed, Tylenol as needed 4. Mood/Sleep/sundowning: Effexor 150 mg every evening, provide emotional support             -antipsychotic agents: Seroquel 50 mg nighty at 2030, have back up dose             -keep sleep chart 5. Neuropsych/cognition: This patient is capable of making decisions on his own behalf.  6. Skin/Wound Care: Routine skin checks 7. Fluids/Electrolytes/Nutrition: Routine in and outs with follow-up chemistries, continue vitamins/supplements 8.  Hypothyroidism.  Synthroid 57mcg/day 9.  Hypertension. Norvasc 10 mg daily, Toprol-Xl 100 mg daily, Aldactone 25 mg daily.             -bp trending up. Likely will need additions to regimen  -02/22/23 BPs 140s/90s, not terrible, monitor trend with increase mobility  2/10 BP controlled, decrease spironolactone due to elevated CR/BUN -02/28/23 BP stable but creeping up, continue to monitor for now but may need to increase meds back -2/25 BP stable, hold losartan for now due to renal function  Vitals:   03/07/23 0416 03/07/23 1026 03/07/23 1040 03/07/23 1556  BP: (!) 165/92 (!) 125/90 118/84 135/88   03/07/23 1940 03/08/23 0416 03/08/23 1505 03/09/23 0449  BP: 139/88 (!) 153/95 (!) 150/97 (!) 143/88   03/09/23 1136 03/09/23 1512 03/09/23 1945 03/10/23 0425  BP: (!) 126/91 (!) 142/88 124/83 122/70    10.H/O prostate cancer. Myrbetriq 50 mg daily. 11.Hyperlipidemia. Lipitor 80mg  daily 12.Constipation.             -miralax daily, SenokotS 1 tab daily -02/22/23 LBM 2days ago, monitor today, if no BM tomorrow then may want to increase regimen -2/25 LBM 2/23, consider addtional medication if no bowel movement today 13.H/O gout. Zyloprim 300mg  daily 14.  AKI versus CKD  -Encourage oral fluid intake, decrease  spironolactone to 12.5 mg  -2/11 recheck BMP today  Addendum- increased Cr, stop spironolactone, start IVF NS 3ml/HR  -2/12 Cr slightly improved, continue IVF, encourage oral fluids  -2/13 recheck BMP- CR down to 1.2  -2/14 DC IVF, discussed oral hydration  2/17 BUN/CR stable 17/1.25  -2/18 continue to encourage oral hydration -2/20 BUN and creatinine improved to 21/1.25, got IV fluids last night 55ml/hr   2/21 Encourage PO fluid intake  -03/07/23 Cr 0.99 today, poor PO intake, a little dizzy this morning, will give IVFs @50ml /hr x12hrs now, and have standing PRN order for 50ml/hr x12hrs at bedtime if PO intake <1219ml/day -03/08/23 doing much better today after IVFs; leave standing order on for now, but encouraged pt to drink water throughout the day  2/24---give additional IVF today as Cr sl up  2/25 hold losartan, will hold off on IVF for now and encourage oral hydration.  Caution with IVF as x-ray chest with increased pulmonary congestion 15.  History of CHF.  Last EF 55%, mild diastolic dysfunction  -Continue metoprolol, decrease spironolactone to 12.5 mg as above -2/14 Wts stable, has not appear to show signs of fluid overload, continue to monitor -02/28/23 wt up but doubt accuracy, monitor, no overload on exam -2/19-20 weight stable, does not appear to show signs of fluid overload, EF 40 to 45% on echo. -Cardiology has been following, they discontinued amlodipine and started losartan 25 mg daily-beginning tomorrow-- signing off 2/22 (see recs below)  -Follow-up with [facility]ology at the [facility] postdischarge  -2/25 weights appear stable overall.  No clinical signs of fluid overload overall. Filed Weights   03/08/23 0418 03/09/23 0500 03/10/23 0428  Weight: 89 kg 89.3 kg 87.7 kg    16.  Severe cervical stenosis: Consider neurosurgery outpatient follow-up 17. Insomnia -Patient has Seroquel 50 mg scheduled and 25 mg as needed.  Will also add in 3 mg melatonin at bedtime  -2/11 increase  Seroquel to 75 mg nightly  -2/13 asked nursing to complete sleep-wake chart  -2/14 reports sleeping better, continue current regimen for now and monitor  -  2/20 will decrease Seroquel back to 50 mg nightly and 25 mg at bedtime  2/21 Decrease seroquel to 25mg  nightly and 25mg  PRN  2/25 increase Seroquel scheduled back to 25 nightly and 25 as needed  18.  Bleeding from his penis  -UA neg,HGB was stable on 2/11 -Denies additional occurrences.  During team meeting was noted this happened intermittently during acute hospital stay  19. Nasal congestion: Flonase 1 spray daily  20. Staring episodes/vision changes 03/01/23 -03/01/23 having staring episodes and ?chronic vision changes/hemianopsia? Spoke with Dr. Derry Lory of neuro, will get 24hr EEG and head CT, no code stroke at this point; they will see pt, appreciate their assistance.  -2/17 overnight EEG neg, CT head without acute changes  21. Chest pain  -EKG, Trop, CXR ordered  -Cardiology following appreciate assistance.  D-dimer positive, getting CT PE study to rule out PE.  If positive will need to discuss with neurology regarding anticoagulation.  He is now on 2 L nasal cannula oxygen  -2/20 patient reports his chest pain is resolved  22. Pulmonary embolism / DVT left popliteal vein -2/19 discussed with neurology and cardiology. Heparin grip started, wife updated by phone. Critical care medicine consulted 2/20 seen by critical care, continue heparin eventual transition to DOAC, if hemodynamic changes contact PCCM again. Advised 24h bedrest with [facility] 2/21 plan to transition to DOAC later today, discussed with pharmacy by phone  -03/07/23 cardiology saw pt, recommend eliquis per protolocol, continue toprol/low dose losartan, and they signed off-- f/up in 3-4wks with Dr. Odis Hollingshead  23. Leukocytosis: 13k today  -SLP felt that he was having more difficulties from a cognitive standpoint today  -he is afebrile. No cough or respiratory sx  -will check  UA, UCX- UA does not indicate infection  -recheck CBC in AM  -CXR yesterday with pulmonary vascular congestion  -WBC down to 7.5 2/25   LOS: 17 days A FACE TO FACE EVALUATION WAS PERFORMED  Fanny Dance 03/10/2023, 10:43 AM

## 2023-03-10 NOTE — Progress Notes (Signed)
 Patient ID: Edward Rodgers, male   DOB: 1950/06/06, 73 y.o.   MRN: [id number]  Met with wife who was here earlier to discuss set back due to medical issues and need for extension along with multiple days of family training prior to discharge home.

## 2023-03-10 NOTE — Progress Notes (Signed)
 Physical Therapy Session Note  Patient Details  Name: Edward Rodgers MRN: [id number] Date of Birth: 01-Jan-1951  Today's Date: 03/10/2023 PT Individual Time: [id number], [id number] PT Individual Time Calculation (min): 42 min, 58 min   Short Term Goals: Week 3:  PT Short Term Goal 1 (Week 3): Pt will perform WC consult for TIS WC PT Short Term Goal 2 (Week 3): therapist will initiate hands on family training PT Short Term Goal 3 (Week 3): Pt will perform bed to chair transfer with mod A or less consistently PT Short Term Goal 4 (Week 3): pt will perform sit to stand with mod A or less consistently  Skilled Therapeutic Interventions/Progress Updates:      Treatment Session 1   Pt supine in bed upon arrival. Pt agreeable to therapy. Pt denies any pain.  Pt demos improved alartness today in comparison to yesterday.   Pt wife present, during MD rounds. Pt wife reports pt restless and moaning and groaning throughout the night.   Pt had extensive conversation with pt wife regarding pt diagnosis, impairments, CLOF, and anticipated level of function upon discharge. Therapist recommended discharge to SNF 2/2 therapist concerns with pt CLOF and pt wife ability to provide 24/7 assistance, as pt wife injured her leg and is limping throughout the room.  Pt wife currently not open to pt discharging to SNF. Pt wife reports pt brother and herself will be providing assistance and she may need to hire additional help.   Therapist provided education that the current plan for discharge is 3/1 however the IDT is meeting on 2/26, and I anticipate the length of stay to be extended to allow pt family to get comfortable/safe with pt handling, and to get pt a TIS WC, and progress independence/consistency with transfers. Education provided TIS [facility] consult is scheduled for 3/4 at 1:00.   Therapist emphasized the importance of pt wife and pt brother attending therapy sessions to practice transfers/ADLs with pt. Education  provided for knock-off version of steady transfer to reduce overall caregiver burden with transfers and ADLs. Pt wife reports they are unable to afford it.   Recommended hospital bed for improved pt independence and to reduce overall burden of care. Pt wife agreeable to hospital bed.   Pt wife assisted donning pt pants with total A while pt rolled to L with CGA, and to R with mod A, verbal cues provided to pt for use of L UE on bed rail and bending L knee for improved ease for rolling.   Supine to sit with mod A for trunk and LE with HOB elevated and use of bed rails, verbal cues provided for technique and sequencing. Pt required min-mod A for static seated balance.   Pt reports need to use bathroom. Pt performed squat pivot transfer WC to BSC with max A, verbal cues provided for anterior weight shift, UE positioning.   Pt performed sit to stand with mod A, with no AD with pt wife acting as +2  for donning/doffing pants with total A.   Pt wife overall very fatigued, and reports she didn't get any sleep last night as she was up with him. Pt appears to have a better understanding of pt overall CLOF. Education provided we will continue to practice each session, and provide further education for safest method to assist pt with mobility at home.   Pt seated on BSC at end of session with tech and wife in room.   Treatment Session 2   Pt  supine in bed upon arrival with pt brother in room. Pt agreeable to therapy. Pt denies any pain.   PT had extensive conversation with pt brother regarding pt diagnosis, impairments, CLOF, and anticipated level of function upon discharge.  Education provided pt level of assist fluctuates with fatigue, attention, etc.   Pt brother reports he is retired and doesn't live with his brother but will be there intermittently to provide assistance. Pt brother concerned about helping 2/2 his back pain. Pt brother asking about steady. Education provided regarding knock off  version of steady and costs. Will provide handout for pt and pt brother next session.   Education provided regarding management of the WC, how to lock/unlock, tilt backwards for pressure relief and to prevent pt from sliding out of chair.  Discussed recommendation of squat pivot transfer from getting to bed to [facility]. Pt performed mass practice of squat pivot transfer bed to chair with therapist x4 each direction, with therapist emphasizing handling technique, pt performed with max A progressing to light mod A. Pt performed squat pivot transfer with pt brother with therapist providing assist for safety x2 in each direction, verbal cues provided for proper positioning and technique.   Pt performed sit to stand x 5 with RW and L UE on splint with mod progressing min A, verbal/tactile cues provided for UE/LE positioning, anterior weight shift and L LE activation.   Pt seated in WC at end of session with all needs within reach and bed alarm on.     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 Restrictions Weight Bearing Restrictions Per Provider Order: No   Therapy/Group: Individual Therapy  [facility] Ambrose Finland, [place], DPT  03/10/2023, 7:49 AM

## 2023-03-10 NOTE — Progress Notes (Signed)
 Occupational Therapy Session Note  Patient Details  Name: Edward Rodgers MRN: [id number] Date of Birth: 07/11/1950  Today's Date: 03/10/2023 OT Individual Time: 1120-1200 OT Individual Time Calculation (min): 40 min    Short Term Goals: Week 3:  OT Short Term Goal 1 (Week 3): STGs=LTGs due to patient's estimated length of stay.  Skilled Therapeutic Interventions/Progress Updates:    Pt greeted semi-reclined in bed, dozing off, but agreeable to OT treatment session. OT offered pt to get OOB to wc, but pt declined due to fatigue. OT placed kinesiotape to L hand for edema management and L attention. L UE NMR with focus on wrist flexion/extension. Pronation/supination, and shouler and elbow flex/ext. OT educated and demonstrated self ROM technique with pt demonstrating understanding. Pt assisted with scooting up in bed and left semmi-reclined with bed alarm on and family present.   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 Restrictions Weight Bearing Restrictions Per Provider Order: No Pain:  Denies pain   Therapy/Group: Individual Therapy  Mal Amabile 03/10/2023, 12:29 PM

## 2023-03-10 NOTE — Plan of Care (Signed)
  Problem: Consults Goal: RH STROKE PATIENT EDUCATION Description: See Patient Education module for education specifics  Outcome: Progressing   Problem: RH BOWEL ELIMINATION Goal: RH STG MANAGE BOWEL WITH ASSISTANCE Description: STG Manage Bowel with min Assistance. Outcome: Progressing Goal: RH STG MANAGE BOWEL W/MEDICATION W/ASSISTANCE Description: STG Manage Bowel with Medication with mod I  Assistance. Outcome: Progressing   Problem: RH BLADDER ELIMINATION Goal: RH STG MANAGE BLADDER WITH ASSISTANCE Description: STG Manage Bladder With min Assistance Outcome: Progressing Goal: RH STG MANAGE BLADDER WITH MEDICATION WITH ASSISTANCE Description: STG Manage Bladder With Medication With mod I Assistance. Outcome: Progressing   Problem: RH SKIN INTEGRITY Goal: RH STG SKIN FREE OF INFECTION/BREAKDOWN Description: Manage skin w min assist Outcome: Progressing Goal: RH STG MAINTAIN SKIN INTEGRITY WITH ASSISTANCE Description: STG Maintain Skin Integrity With min Assistance. Outcome: Progressing   Problem: RH SAFETY Goal: RH STG ADHERE TO SAFETY PRECAUTIONS W/ASSISTANCE/DEVICE Description: STG Adhere to Safety Precautions With cues  Assistance/Device. Outcome: Progressing   Problem: RH COGNITION-NURSING Goal: RH STG USES MEMORY AIDS/STRATEGIES W/ASSIST TO PROBLEM SOLVE Description: STG Uses Memory Aids/Strategies With cues Assistance to Problem Solve. Outcome: Progressing   Problem: RH KNOWLEDGE DEFICIT Goal: RH STG INCREASE KNOWLEDGE OF DIABETES Description: Patient and wife will be able to manage prediabetes using educational resources for medications and dietary modification independently Outcome: Progressing Goal: RH STG INCREASE KNOWLEDGE OF HYPERTENSION Description: Patient and wife will be able to manage HTN using educational resources for medications and dietary modification independently Outcome: Progressing Goal: RH STG INCREASE KNOWLEGDE OF  HYPERLIPIDEMIA Description: Patient and wife will be able to manage HLD using educational resources for medications and dietary modification independently Outcome: Progressing Goal: 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

## 2023-03-10 NOTE — Progress Notes (Signed)
 Speech Language Pathology Daily Session Note  Patient Details  Name: Edward Rodgers MRN: [id number] Date of Birth: Aug 29, 1950  Today's Date: 03/10/2023 SLP Individual Time: 0802-0900 SLP Individual Time Calculation (min): 58 min  Short Term Goals: Week 3: SLP Short Term Goal 1 (Week 3): STGs=LTGs d/t ELOS  Skilled Therapeutic Interventions:   Pt was seen in am to address cognitive re- training. Pt with eyes closed upon SLP arrival. Pt's wife present and attempting to get patient dressed. Pt alerted to verbal stimulus temporarily before returning back to sleep. SLP assisting pt's wife in donning clothing with limited return assistance from pt and his eyes remaining closed. SLP providing continuous verbal stimulation with pt able to achieve full alertness with more than reasonable amount of time. Pt oriented to month, year, location and situation indep however requiring mod verbal cues for orientation to upcoming discharge and day of week. SLP challenged pt in interpretation and subsequent recall of therapy schedule with pt warranting min cues for use of schedule and mod cues to recall subsequent therapies. SLP challenging pt in use of external aids throughout room for recall of staff names with pt requiring min A to identify therapist, MD, and nursing staff names. Problem solving was addressed through challenging pt to identify barriers upon transition home. Pt warranting mod assist to identify physical and cognitive limitations impeding him in return to his normal daily task. Direct handoff to PT and MD at conclusion of session. SLP to continue POC.  Pain  No/ denies pain  Therapy/Group: Individual Therapy  Renaee Munda 03/10/2023, 8:57 AM

## 2023-03-11 DIAGNOSIS — E871 Hypo-osmolality and hyponatremia: Secondary | ICD-10-CM

## 2023-03-11 LAB — BASIC METABOLIC PANEL
Anion gap: 6 (ref 5–15)
BUN: 25 mg/dL — ABNORMAL HIGH (ref 8–23)
CO2: 22 mmol/L (ref 22–32)
Calcium: 9 mg/dL (ref 8.9–10.3)
Chloride: 103 mmol/L (ref 98–111)
Creatinine, Ser: 1.48 mg/dL — ABNORMAL HIGH (ref 0.61–1.24)
GFR, Estimated: 50 mL/min — ABNORMAL LOW (ref >60)
Glucose, Bld: 105 mg/dL — ABNORMAL HIGH (ref 70–99)
Potassium: 3.7 mmol/L (ref 3.5–5.1)
Sodium: 131 mmol/L — ABNORMAL LOW (ref 135–145)

## 2023-03-11 LAB — CBC
HCT: 33.8 % — ABNORMAL LOW (ref 39.0–52.0)
Hemoglobin: 11 g/dL — ABNORMAL LOW (ref 13.0–17.0)
MCH: 28.4 pg (ref 26.0–34.0)
MCHC: 32.5 g/dL (ref 30.0–36.0)
MCV: 87.1 fL (ref 80.0–100.0)
Platelets: 188 10*3/uL (ref 150–400)
RBC: 3.88 MIL/uL — ABNORMAL LOW (ref 4.22–5.81)
RDW: 14.2 % (ref 11.5–15.5)
WBC: 5.3 10*3/uL (ref 4.0–10.5)
nRBC: 0 % (ref 0.0–0.2)

## 2023-03-11 MED ORDER — SODIUM CHLORIDE 0.9 % IV SOLN
INTRAVENOUS | Status: DC
  Administered 2023-03-13: 50 mL/h via INTRAVENOUS

## 2023-03-11 NOTE — Progress Notes (Addendum)
 PROGRESS NOTE   Subjective/Complaints:  Working with therapy this AM. Brother in the room.  Continues to have limited PO intake.   ROS: Patient denies fever, chills, dizziness, nausea, vomiting, diarrhea,  shortness of breath or chest pain, headache, or mood change. +poor appetite   Objective:   DG Chest Port 1 View Result Date: 03/09/2023 CLINICAL DATA:  Delirium, leukocytosis, history of pulmonary emboli EXAM: PORTABLE CHEST 1 VIEW COMPARISON:  03/03/2023 FINDINGS: Single frontal view of the chest was obtained with the patient rotated toward the right. Loop recorder is again noted. The cardiac silhouette is stable, with persistent ectasia and atherosclerosis of the thoracic aorta. Increased pulmonary vascular congestion. No acute airspace disease, effusion, or pneumothorax. IMPRESSION: 1. Pulmonary vascular congestion.  No acute airspace disease. Electronically Signed   By: Sharlet Salina M.D.   On: 03/09/2023 16:36    Recent Labs    03/10/23 0513 03/11/23 0528  WBC 7.5 5.3  HGB 10.9* 11.0*  HCT 33.2* 33.8*  PLT 180 188    Recent Labs    03/10/23 0513 03/11/23 0528  NA 131* 131*  K 3.6 3.7  CL 105 103  CO2 21* 22  GLUCOSE 102* 105*  BUN 23 25*  CREATININE 1.54* 1.48*  CALCIUM 8.8* 9.0        Intake/Output Summary (Last 24 hours) at 03/11/2023 1207 Last data filed at 03/11/2023 0858 Gross per 24 hour  Intake 180 ml  Output --  Net 180 ml         Physical Exam: Vital Signs Blood pressure (!) 141/85, pulse 82, temperature 97.7 F (36.5 C), resp. rate 17, height 5\' 9"  (1.753 m), weight 87.7 kg, SpO2 94%.  Constitutional: No distress . Vital signs reviewed. HEENT: NCAT, EOMI, oral membranes dry Neck: supple Cardiovascular: RRR without murmur. No JVD    Respiratory/Chest: CTA Bilaterally without wheezes or rales. Normal effort    GI/Abdomen: BS +, non-tender, non-distended Ext: no clubbing, cyanosis,  or edema Psych: pleasant and cooperative, a little flat Skin:clean and dry Neuro:     He is awake and alert working with therapy.  Oriented to month, day, hospital, self.  Mildly slow to process. + decreased memory.  +mild confusion,  Slightly dysarthric speech.  Decreased insight and awareness.  Language overall appears to be intact.  Left central 7.  Decreased sensation to light touch in left upper extremity and left lower extremity.  Moving all 4 extremities to gravity and resistance Mildly increased tone R hand present  -overall stable 2/26   Assessment/Plan: 1. Functional deficits which require 3+ hours per day of interdisciplinary therapy in a comprehensive inpatient rehab setting. Physiatrist is providing close team supervision and 24 hour management of active medical problems listed below. Physiatrist and rehab team continue to assess barriers to discharge/monitor patient progress toward functional and medical goals  Care Tool:  Bathing    Body parts bathed by patient: Left arm, Chest, Abdomen, Front perineal area, Right upper leg, Left upper leg, Right lower leg, Face   Body parts bathed by helper: Right arm, Buttocks, Left lower leg     Bathing assist Assist Level: Moderate Assistance - Patient 50 - 74%  Upper Body Dressing/Undressing Upper body dressing        Upper body assist Assist Level: Moderate Assistance - Patient 50 - 74%    Lower Body Dressing/Undressing Lower body dressing            Lower body assist Assist for lower body dressing: Maximal Assistance - Patient 25 - 49%     Toileting Toileting    Toileting assist Assist for toileting: Maximal Assistance - Patient 25 - 49%     Transfers Chair/bed transfer  Transfers assist     Chair/bed transfer assist level: Moderate Assistance - Patient 50 - 74% (squat pivot/stand pivot)     Locomotion Ambulation   Ambulation assist   Ambulation activity did not occur: Safety/medical  concerns  Assist level: 2 helpers (+2 for WC follow) Assistive device: Walker-rolling Max distance: 50 feet   Walk 10 feet activity   Assist  Walk 10 feet activity did not occur: Safety/medical concerns  Assist level: 2 helpers Assistive device: Walker-rolling   Walk 50 feet activity   Assist Walk 50 feet with 2 turns activity did not occur: Safety/medical concerns  Assist level: 2 helpers Assistive device: Walker-rolling    Walk 150 feet activity   Assist Walk 150 feet activity did not occur: Safety/medical concerns         Walk 10 feet on uneven surface  activity   Assist Walk 10 feet on uneven surfaces activity did not occur: Safety/medical concerns         Wheelchair     Assist Is the patient using a wheelchair?: Yes Type of Wheelchair: Manual    Wheelchair assist level: Minimal Assistance - Patient > 75% Max wheelchair distance: 50    Wheelchair 50 feet with 2 turns activity    Assist        Assist Level: Minimal Assistance - Patient > 75%   Wheelchair 150 feet activity     Assist      Assist Level: Maximal Assistance - Patient 25 - 49%   Blood pressure (!) 141/85, pulse 82, temperature 97.7 F (36.5 C), resp. rate 17, height 5\' 9"  (1.753 m), weight 87.7 kg, SpO2 94%.  Medical Problem List and Plan: 1. 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  Await plan for loop recorder             -patient may shower -ELOS/Goals: 21-25 days, min assist with PT, OT and supervision with SLP -Expected discharge 03/14/2023 -Continue CIR therapies including PT, OT, and SLP  -Team conference today please see physician documentation under team conference tab, met with team  to discuss problems,progress, and goals. Formulized individual treatment plan based on medical history, underlying problem and comorbidities.    2.  Antithrombotics: -DVT/anticoagulation:   Pharmaceutical: eliquis per pharmacy -03/07/23 now on eliquis per pharmacy -antiplatelet therapy: Aspirin 81 mg daily and Plavix 75 mg day x 3 weeks then Plavix alone -2/20 DC aspirin and Plavix as on heparin, discussed with neurology and cardiology-- 2/22 now on eliquis  3. Pain Management: Lidoderm patch as directed, Tylenol as needed 4. Mood/Sleep/sundowning: Effexor 150 mg every evening, provide emotional support             -antipsychotic agents: Seroquel 50 mg nighty at 2030, have back up dose             -keep sleep chart 5. Neuropsych/cognition: This patient is capable of making decisions on his own behalf.  6. Skin/Wound Care: Routine skin checks 7. Fluids/Electrolytes/Nutrition: Routine in and outs with follow-up chemistries, continue vitamins/supplements 8.  Hypothyroidism.  Synthroid 18mcg/day 9.  Hypertension. Norvasc 10 mg daily, Toprol-Xl 100 mg daily, Aldactone 25 mg daily.             -bp trending up. Likely will need additions to regimen  -02/22/23 BPs 140s/90s, not terrible, monitor trend with increase mobility  2/10 BP controlled, decrease spironolactone due to elevated CR/BUN -02/28/23 BP stable but creeping up, continue to monitor for now but may need to increase meds back -2/25 BP stable, hold losartan for now due to renal function -2/26 BP stable continue to monitor   Vitals:   03/07/23 1556 03/07/23 1940 03/08/23 0416 03/08/23 1505  BP: 135/88 139/88 (!) 153/95 (!) 150/97   03/09/23 0449 03/09/23 1136 03/09/23 1512 03/09/23 1945  BP: (!) 143/88 (!) 126/91 (!) 142/88 124/83   03/10/23 0425 03/10/23 1558 03/10/23 1955 03/11/23 0440  BP: 122/70 127/84 118/86 (!) 141/85    10.H/O prostate cancer. Myrbetriq 50 mg daily. 11.Hyperlipidemia. Lipitor 80mg  daily 12.Constipation.             -miralax daily, SenokotS 1 tab daily -02/22/23 LBM 2days ago, monitor today, if no BM tomorrow then may want to increase regimen -2/25 LBM 2/23, consider addtional medication if no bowel  movement today -2/26 LBM today, monitor 13.H/O gout. Zyloprim 300mg  daily 14.  AKI versus CKD  -Encourage oral fluid intake, decrease spironolactone to 12.5 mg  -2/11 recheck BMP today  Addendum- increased Cr, stop spironolactone, start IVF NS 57ml/HR  -2/12 Cr slightly improved, continue IVF, encourage oral fluids  -2/13 recheck BMP- CR down to 1.2  -2/14 DC IVF, discussed oral hydration  2/17 BUN/CR stable 17/1.25  -2/18 continue to encourage oral hydration -2/20 BUN and creatinine improved to 21/1.25, got IV fluids last night 56ml/hr   2/21 Encourage PO fluid intake  -03/07/23 Cr 0.99 today, poor PO intake, a little dizzy this morning, will give IVFs @50ml /hr x12hrs now, and have standing PRN order for 58ml/hr x12hrs at bedtime if PO intake <1274ml/day -03/08/23 doing much better today after IVFs; leave standing order on for now, but encouraged pt to drink water throughout the day  2/24---give additional IVF today as Cr sl up  2/25 hold losartan, will hold off on IVF for now and encourage oral hydration.  Caution with IVF as x-ray chest with increased pulmonary congestion  2/26 consider gentle IVF tonight 15.  History of CHF.  Last EF 55%, mild diastolic dysfunction  -Continue metoprolol, decrease spironolactone to 12.5 mg as above -2/14 Wts stable, has not appear to show signs of fluid overload, continue to monitor -02/28/23 wt up but doubt accuracy, monitor, no overload on exam -2/19-20 weight stable, does not appear to show signs of fluid overload, EF 40 to 45% on echo. -Cardiology has been following, they discontinued amlodipine and started losartan 25 mg daily-beginning tomorrow-- signing off 2/22 (see recs below)  -Follow-up [facility]ology at the [facility]charge  -2/26 weight a little lower, continue to monitor  Filed Weights   03/08/23 0418 03/09/23 0500 03/10/23 0428  Weight: 89 kg 89.3 kg 87.7 kg    16.  Severe cervical stenosis: Consider neurosurgery outpatient  follow-up 17. Insomnia -Patient has Seroquel 50 mg scheduled and 25 mg as needed.  Will also add in 3 mg melatonin at bedtime  -2/11 increase Seroquel to 75 mg nightly  -2/13 asked nursing to complete sleep-wake chart  -  2/14 reports sleeping better, continue current regimen for now and monitor  -2/20 will decrease Seroquel back to 50 mg nightly and 25 mg at bedtime  2/21 Decrease seroquel to 25mg  nightly and 25mg  PRN  2/25 increase Seroquel scheduled back to 25 nightly and 25 as needed  2/26 sleeping better, continue to monitor  18.  Bleeding from his penis  -UA neg,HGB was stable on 2/11 -Denies additional occurrences.  During team meeting was noted this happened intermittently during acute hospital stay  19. Nasal congestion: Flonase 1 spray daily  20. Staring episodes/vision changes 03/01/23 -03/01/23 having staring episodes and ?chronic vision changes/hemianopsia? Spoke with Dr. Derry Lory of neuro, will get 24hr EEG and head CT, no code stroke at this point; they will see pt, appreciate their assistance.  -2/17 overnight EEG neg, CT head without acute changes  21. Chest pain  -EKG, Trop, CXR ordered  -Cardiology following appreciate assistance.  D-dimer positive, getting CT PE study to rule out PE.  If positive will need to discuss with neurology regarding anticoagulation.  He is now on 2 L nasal cannula oxygen  -2/20 patient reports his chest pain is resolved  22. Pulmonary embolism / DVT left popliteal vein -2/19 discussed with neurology and cardiology. Heparin grip started, wife updated by phone. Critical care medicine consulted 2/20 seen by critical care, continue heparin eventual transition to DOAC, if hemodynamic changes contact PCCM again. Advised 24h bedrest with [facility] 2/21 plan to transition to DOAC later today, discussed with pharmacy by phone  -03/07/23 cardiology saw pt, recommend eliquis per protolocol, continue toprol/low dose losartan, and they signed off-- f/up in 3-4wks  with Dr. Odis Hollingshead  23. Leukocytosis: 13k today  -SLP felt that he was having more difficulties from a cognitive standpoint today  -he is afebrile. No cough or respiratory sx  -will check UA, UCX- UA does not indicate infection  -recheck CBC in AM  -CXR yesterday with pulmonary vascular congestion  -WBC down to 7.5 2/25 24. Mild hyponatremia, mild continue to monitor    LOS: 18 days A FACE TO FACE EVALUATION WAS PERFORMED  Fanny Dance 03/11/2023, 12:07 PM

## 2023-03-11 NOTE — Plan of Care (Signed)
  Problem: Consults Goal: RH STROKE PATIENT EDUCATION Description: See Patient Education module for education specifics  Outcome: Progressing   Problem: RH BOWEL ELIMINATION Goal: RH STG MANAGE BOWEL WITH ASSISTANCE Description: STG Manage Bowel with min Assistance. Outcome: Progressing Goal: RH STG MANAGE BOWEL W/MEDICATION W/ASSISTANCE Description: STG Manage Bowel with Medication with mod I  Assistance. Outcome: Progressing   Problem: RH BLADDER ELIMINATION Goal: RH STG MANAGE BLADDER WITH ASSISTANCE Description: STG Manage Bladder With min Assistance Outcome: Progressing Goal: RH STG MANAGE BLADDER WITH MEDICATION WITH ASSISTANCE Description: STG Manage Bladder With Medication With mod I Assistance. Outcome: Progressing   Problem: RH SKIN INTEGRITY Goal: RH STG SKIN FREE OF INFECTION/BREAKDOWN Description: Manage skin w min assist Outcome: Progressing Goal: RH STG MAINTAIN SKIN INTEGRITY WITH ASSISTANCE Description: STG Maintain Skin Integrity With min Assistance. Outcome: Progressing   Problem: RH SAFETY Goal: RH STG ADHERE TO SAFETY PRECAUTIONS W/ASSISTANCE/DEVICE Description: STG Adhere to Safety Precautions With cues  Assistance/Device. Outcome: Progressing   Problem: RH COGNITION-NURSING Goal: RH STG USES MEMORY AIDS/STRATEGIES W/ASSIST TO PROBLEM SOLVE Description: STG Uses Memory Aids/Strategies With cues Assistance to Problem Solve. Outcome: Progressing   Problem: RH KNOWLEDGE DEFICIT Goal: RH STG INCREASE KNOWLEDGE OF DIABETES Description: Patient and wife will be able to manage prediabetes using educational resources for medications and dietary modification independently Outcome: Progressing Goal: RH STG INCREASE KNOWLEDGE OF HYPERTENSION Description: Patient and wife will be able to manage HTN using educational resources for medications and dietary modification independently Outcome: Progressing Goal: RH STG INCREASE KNOWLEGDE OF  HYPERLIPIDEMIA Description: Patient and wife will be able to manage HLD using educational resources for medications and dietary modification independently Outcome: Progressing Goal: 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

## 2023-03-11 NOTE — Progress Notes (Signed)
 Occupational Therapy Session Note  Patient Details  Name: Edward Rodgers MRN: [id number] Date of Birth: Jul 18, 1950  Today's Date: 03/11/2023 OT Individual Time: 1002-1029 OT Individual Time Calculation (min): 27 min   Short Term Goals: Week 3:  OT Short Term Goal 1 (Week 3): STGs=LTGs due to patient's estimated length of stay.  Skilled Therapeutic Interventions/Progress Updates:  Pt greeted sitting in recliner for skilled OT session with focus on caregiver education, emphasizing current level of functioning (physically & cognitively) and potential for LOS extension. Brother present and receptive throughout conversation. Handout provided for more affordable (~$350) sara stedy, but brother advised to hold off purchasing until LOS is verified.   Pt remained sitting in recliner with 4Ps assessed and immediate needs met. Pt continues to be appropriate for skilled OT intervention to promote further functional independence in ADLs/IADLs.   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 Restrictions Weight Bearing Restrictions Per Provider Order: No   Therapy/Group: Individual Therapy  Lou Cal, OTR/L, MSOT  03/11/2023, 5:12 AM

## 2023-03-11 NOTE — Progress Notes (Signed)
 Speech Language Pathology Daily Session Note  Patient Details  Name: Edward Rodgers MRN: [id number] Date of Birth: [birth year]  Today's Date: 03/11/2023 SLP Individual Time: 0907-1001 and 1502-1530 SLP Individual Time Calculation (min): 54 min and 28 min  Short Term Goals: Week 3: SLP Short Term Goal 1 (Week 3): STGs=LTGs d/t ELOS  Skilled Therapeutic Interventions:   Session 1: SLP conducted skilled therapy session targeting cognitive retraining goals. Patient required mod to max assist throughout session to sustain attention to conversation and to task. Reviewed calendar (oriented to date with supervision using external aid), daily schedule (required min assist for interpretation and max assist for basic problem solving using therapy times), and clock drawing tasks. During clock drawing task, patient required mod to max assist for spatial orientation and min assist to place accurate time on clocks once numbered. Patient endorsed frustration with current skill level. Provided counseling as it directly pertains to cognitive communication deficits. Patient was left in chair with call bell in reach and chair alarm set. SLP will continue to target goals per plan of care.    Session 2: SLP conducted skilled therapy session targeting cognitive retraining goals. SLP facilitated memory book task and updated book with events from the day's therapy sessions. Patient required max assist to recall events. Patient's wife and sister-in-law present throughout session and engaged in introductory stages of family education including deficits, goals, supports currently needed, and anticipated outcomes. Patient was left in lowered bed with call bell in reach and bed alarm set. SLP will continue to target goals per plan of care.       Pain Pain Assessment Pain Scale: 0-10 Pain Score: 0-No pain  Therapy/Group: Individual Therapy  [name], M.A., CCC-SLP  [name] 03/11/2023, 12:25 PM

## 2023-03-11 NOTE — Progress Notes (Signed)
 Physical Therapy Session Note  Patient Details  Name: Edward Rodgers MRN: [id number] Date of Birth: 1950-12-11  Today's Date: 03/11/2023 PT Individual Time: 1030-1100 PT Individual Time Calculation (min): 30 min   Short Term Goals: Week 3:  PT Short Term Goal 1 (Week 3): Pt will perform WC consult for TIS WC PT Short Term Goal 2 (Week 3): therapist will initiate hands on family training PT Short Term Goal 3 (Week 3): Pt will perform bed to chair transfer with mod A or less consistently PT Short Term Goal 4 (Week 3): pt will perform sit to stand with mod A or less consistently  Skilled Therapeutic Interventions/Progress Updates: Pt presents sitting in recliner and agreeable to therapy, brother present throughout session.  Pt required total A for doffing slipper socks and donning socks and shoes, although does attempt doffing socks, but in interest of time, PT completed.  Pt cued for use of LUE w/ hand placement on arm rest.  Pt required hand over hand to maintain w/ transfer sit to stand and mod A.  Pt stood in RW w/ CGA for short periods of time, cues for L knee extension.  Pt aware of falling to L and forward but requires A to regain.  Pt performed marches w/ mod A and cueing for L foot placement and knee extension w/ WB.  Facilitation at L elbow for UE use.  Pt required step by step cues for L hand placement on arm rest for stand to sit transfer.  Pt remained in recliner w/ chair alarm on and all needs in reach, brother present.     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 Restrictions Weight Bearing Restrictions Per Provider Order: No General:   Vital Signs:   Pain:0/10 Pain Assessment Pain Scale: 0-10 Pain Score: 0-No pain    Therapy/Group: Individual Therapy  [name] 03/11/2023, 11:06 AM

## 2023-03-11 NOTE — Progress Notes (Signed)
 Patient ID: Edward Rodgers, male   DOB: [birth year], 73 y.o.   MRN: [id number]  Met with pt, brother and pt's wife were present in his room to discuss team conference goals of min assist level and new target discharge date of 3/15. All felt he had declined with medical issues and missed therapies. All aware he will need 24/7 physical care at discharge. Brother was asking if the [place] could provide any services or equipment. Will email Cathy-SW and see how service connected he is and if eligible for any other services. Encouraged them to continue to be here and participate when can. Will continue to work on discharge needs. All plan for him to go home and not to a SNF.

## 2023-03-11 NOTE — Progress Notes (Signed)
 Occupational Therapy Session Note  Patient Details  Name: Edward Rodgers MRN: [id number] Date of Birth: 1950/10/23  Today's Date: 03/11/2023 OT Individual Time: 1345-1400 OT Individual Time Calculation (min): 15 min  and Today's Date: 03/11/2023 OT Missed Time:   Missed Time Reason: Patient fatigue   Short Term Goals: Week 3:  OT Short Term Goal 1 (Week 3): STGs=LTGs due to patient's estimated length of stay.  Skilled Therapeutic Interventions/Progress Updates:    Pt resting in bed upon arrival. Initial focus on LUE NMR and AAROM/PROM to increase pt's ability to actively incorporate LUE in functional tasks and ADLs. OTA left room to gather [employer] to reapply on LUE for edema mgmt. Pt sleeping upon return. Pt aroused but with difficulty keeping eyes open. Pt unable to actively patriciapte. Pt missed 30 mins skilled OT services. Will attempt to see again as schedule allows.   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 Restrictions Weight Bearing Restrictions Per Provider Order: No Pain: Pt denies pain this afternoon   Therapy/Group: Individual Therapy  Rich Brave 03/11/2023, 2:28 PM

## 2023-03-11 NOTE — Progress Notes (Signed)
 Physical Therapy Session Note  Patient Details  Name: Edward Rodgers MRN: [id number] Date of Birth: 1950-10-26  Today's Date: 03/11/2023 PT Individual Time: 1117-1203 PT Individual Time Calculation (min): 46 min   Short Term Goals: Week 3:  PT Short Term Goal 1 (Week 3): Pt will perform WC consult for TIS WC PT Short Term Goal 2 (Week 3): therapist will initiate hands on family training PT Short Term Goal 3 (Week 3): Pt will perform bed to chair transfer with mod A or less consistently PT Short Term Goal 4 (Week 3): pt will perform sit to stand with mod A or less consistently  Skilled Therapeutic Interventions/Progress Updates:      Pt seated in recliner upon arrival. Pt agreeable to therapy. Pt denies any pain. Pt brother gary present throughout session.   Pt brother has questions regarding pt ability to walk in house. Education provided regarding not anticipating pt to be functional ambulator upon discharge 2/2 pt requiring significant skilled assist for ambulation at this time. Education provided that pt will continue to work on gait training with therapy for neuromuscular rededucation and to progress independence with standing, transfers, and ADLs. Jillyn Hidden verbalized understanding and agreeable.   Pt performed squat pivot transfer recliner to WC with +1 max A, max verbal and tactile cues provided for technique/sequencing with emphasis on B LE/UE positioning, and anterior weight shifting.   Pt performed sit to stand with RW and L UE splint on RW with mod progressing to min A max instructional verbal cues provided for technique/sequencing/UE/LE positioning, with visual cues of mirror for midline orientation and standing upright.   Pt ambulated 1x~20 feet, 1x35 feet with RW and mod A with +2 for WC follow, with visual cues of mirror for midline orientation, upright posture/ correction of lateral trunk lean  and visual cues of colored dots for LE positioning to facilitate reciprocal gait and  correction of L LE scissoring and external rotation compensation.   Pt encouraged fluids throughout session. Pt timer on his phone went off during session to remind pt to drink. This helped to be a good reminder to pt. Pt and pt brother very appreciative of timer reminder.   Pt seated in TIS WC at end of session with all needs within reach and seatbelt alarm on.       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 Restrictions Weight Bearing Restrictions Per Provider Order: No  Therapy/Group: Individual Therapy  [facility] [place], [place], DPT  03/11/2023, 1:15 PM

## 2023-03-11 NOTE — Patient Care Conference (Signed)
 Inpatient RehabilitationTeam Conference and Plan of Care Update Date: 03/11/2023   Time: 12:04 PM    Patient Name: Edward Rodgers      Medical Record Number: [id number]  Date of Birth: 1950-10-07 Sex: Male         Room/Bed: 4M04C/4M04C-01 Payor Info: Payor: Multimedia programmer / Plan: [employer] MEDICARE / Product Type: *No Product type* /    Admit Date/Time:  02/21/2023  2:28 PM  Primary Diagnosis:  Acute right PCA stroke [facility])  Hospital Problems: Principal Problem:   Acute right PCA stroke (HCC) Active Problems:   Coping style affecting medical condition   Precordial pain   Shortness of breath   Benign hypertension   Heart failure with improved ejection fraction (HFimpEF) (HCC)   Pulmonary embolus (HCC)   Acute deep vein thrombosis (DVT) of popliteal vein of left lower extremity (HCC)   Chronic heart failure with mildly reduced ejection fraction (HFmrEF, 41-49%) (HCC)   AKI (acute kidney injury) [facility])    Expected Discharge Date: Expected Discharge Date: 03/28/23  Team Members Present: Physician leading conference: Dr. Fanny Dance Social Worker Present: Dossie Der, LCSW Nurse Present: Chana Bode, RN PT Present: Ambrose Finland, PT OT Present: Lou Cal, OT SLP Present: [name], SLP PPS Coordinator present : Fae Pippin, SLP     Current Status/Progress Goal Weekly Team Focus  Bowel/Bladder   Patient can be both continent and incontinent of bladder. Continent of bowel. Last BM 2/25.   Increased continence.   Timed toileting to promote continence.    Swallow/Nutrition/ Hydration               ADL's   Recent decline in functioning due to medical set-backs, overall Mod A for UB care and Max-Total A for LB care & toileting, requiring increased cuing for attention, alertness, and sequecing.   Min A overall; plan to DC shower goal dependent upon consistency of functioning   Caregiver education    Mobility               Communication                 Safety/Cognition/ Behavioral Observations  min to mod assist for attention task, min to mod for memory of daily events/ new information, awareness improving   min assist   basic problem solving, sustained attention, awareness, memory strategies    Pain   Patient occasionally will report pain.   Decreased pain.   Treat pain when needed.    Skin   Patient has stage 2 on sacrum.   Treat pressure injury.  Prevent further skin breakdown.      Discharge Planning:  Wife and brother are here daily. Pt has declined in funciton due to medical issues and will need extension in his stay. Getting ramp for stairs into home. Wants to take home and not to a SNF   Team Discussion: Patient post right PCA CVA with lethargy , wax/waning of cognitive status and poor appetite. Progress limited by bil PEs and DVT with episodes of increased drowsiness and poor activity tolerance.  Patient on target to meet rehab goals: After set backs, patient currently needs mod assist for upper body care and max - total assist for lower body care and toileting. Needs min assist for sit - stand.   Able to ambulate up to 8'  with mod assist and assistance to advance affected leg. Do not anticipate patient will be a functional ambulator at discharge. Needs max assist for cognition,  attention to tasks and increased cues and sequencing instructions for tasks.   *See Care Plan and progress notes for long and short-term goals.   Revisions to Treatment Plan:  CXR, UA, medications adjusted per MD   Teaching Needs: Safety, medications, skin care, transfers, toileting, dietary modifications, etc.   Current Barriers to Discharge: Decreased caregiver support and Home enviroment access/layout  Possible Resolutions to Barriers: Family education 03/10/23 DME: drop arm BSC, T[employer] in Space W/C, stedy     Medical Summary Current Status: CVA, HTN, constipation, PE, Insomnia, AKI/CKD, CHF  Barriers to Discharge:  Cardiac Complications;Incontinence;Medical stability;Renal Insufficiency/Failure;Self-care education  Barriers to Discharge Comments: CVA, HTN, constipation, PE, Insomnia, AKI/CKD, CHF Possible Resolutions to [employer] Focus: seroquel to help with sleep, encourage oral fluids, monitor weight, anticoagulation   Continued Need for Acute Rehabilitation Level of Care: The patient requires daily medical management by a physician with specialized training in physical medicine and rehabilitation for the following reasons: Direction of a multidisciplinary physical rehabilitation program to maximize functional independence : Yes Medical management of patient stability for increased activity during participation in an intensive rehabilitation regime.: Yes Analysis of laboratory values and/or radiology reports with any subsequent need for medication adjustment and/or medical intervention. : Yes   I attest that I was present, lead the team conference, and concur with the assessment and plan of the team.   Chana Bode B 03/11/2023, 2:11 PM

## 2023-03-12 LAB — CBC
HCT: 32.5 % — ABNORMAL LOW (ref 39.0–52.0)
Hemoglobin: 10.5 g/dL — ABNORMAL LOW (ref 13.0–17.0)
MCH: 28 pg (ref 26.0–34.0)
MCHC: 32.3 g/dL (ref 30.0–36.0)
MCV: 86.7 fL (ref 80.0–100.0)
Platelets: 172 10*3/uL (ref 150–400)
RBC: 3.75 MIL/uL — ABNORMAL LOW (ref 4.22–5.81)
RDW: 14.3 % (ref 11.5–15.5)
WBC: 7.7 10*3/uL (ref 4.0–10.5)
nRBC: 0 % (ref 0.0–0.2)

## 2023-03-12 LAB — BASIC METABOLIC PANEL
Anion gap: 10 (ref 5–15)
BUN: 25 mg/dL — ABNORMAL HIGH (ref 8–23)
CO2: 22 mmol/L (ref 22–32)
Calcium: 8.9 mg/dL (ref 8.9–10.3)
Chloride: 101 mmol/L (ref 98–111)
Creatinine, Ser: 1.44 mg/dL — ABNORMAL HIGH (ref 0.61–1.24)
GFR, Estimated: 52 mL/min — ABNORMAL LOW (ref >60)
Glucose, Bld: 95 mg/dL (ref 70–99)
Potassium: 3.9 mmol/L (ref 3.5–5.1)
Sodium: 133 mmol/L — ABNORMAL LOW (ref 135–145)

## 2023-03-12 NOTE — Progress Notes (Signed)
 Occupational Therapy Session Note  Patient Details  Name: Edward Rodgers MRN: [id number] Date of Birth: 1950-06-10  Today's Date: 03/12/2023 OT Individual Time: 4401-0272 OT Individual Time Calculation (min): 71 min    Short Term Goals: Week 3:  OT Short Term Goal 1 (Week 3): STGs=LTGs due to patient's estimated length of stay.  Skilled Therapeutic Interventions/Progress Updates:  Pt greeted seated in TIS, pt agreeable to OT intervention.      Transfers/bed mobility/functional mobility: pt completed squat pivot transfers to R and L side with MODA +1, MAX verbal/visual cues for set- up and technique. Pt completed sit>stands from EOM with HHA and MODA. Blocked LLE and provided support for LUE.    NMR: pt completed various activities focused on NMR to LUE:  - pt instructed on lateral leans to LUE from EOM with an emphasis on weightbearing into LUE from NMR and facilitating improved midline orientation with pt instructed to return to midline after each lean.pt completed task with CGA for balance, MIN verbal cues for sequencing and set-up  -placed large therapy ball under LUE from EOM with pt instructed to roll ball backwards<>forwards with an emphasis on scapular protraction/retraction  - pt completed various exercises with 3 lb weighted ball to provide NMR to LUE, exercises focused on active assist ROM  -pt completed modified push ups by leaning anteriorly onto bench with an emphasis on weightbearing into LUE for NMR  -pt completed reaching task from forward lean on bench as well with an emphasis on crossing midline while LUE provided weightbearing positioning   -pt completed blocked practice of sit>stands from EOM with MODA to promote NMR to LLE. Graded task up and had pt reach dynamically with RUE while LUE provided gross assist.   Cognition: pt confused about time of day and asking "who dropped me off here." Filled out memory book at end of session to increase carryover, pt able to state 3  tasks completed during session.                 Ended session with pt seated in w/c with all needs within reach and safety alarm activated.                    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 Restrictions Weight Bearing Restrictions Per Provider Order: No  Pain: No pain    Therapy/Group: Individual Therapy  Pollyann Glen [facility] 03/12/2023, 12:15 PM

## 2023-03-12 NOTE — Progress Notes (Signed)
 Speech Language Pathology Daily Session Note  Patient Details  Name: Edward Rodgers MRN: [id number] Date of Birth: 05/03/1950  Today's Date: 03/12/2023 SLP Individual Time: 1191-4782 SLP Individual Time Calculation (min): 39 min  Short Term Goals: Week 3: SLP Short Term Goal 1 (Week 3): STGs=LTGs d/t ELOS  Skilled Therapeutic Interventions: SLP conducted skilled therapy session targeting cognitive retraining goals. Upon SLP entry, patient sitting upright in bed and eating breakfast. During meal, patient benefited from min to mod assist to attend to meal items in mildly distracting environment. SLP assisted patient by limiting environmental distractions and placing meal items closer and in line of direct sight. Patient benefited from mod cues to recall events from the previous day after last speech therapy session and was oriented to date using external aids with supervision. Patient oriented to discharge date and requesting March calendar when asked to provide number of days between now and discharge. Patient exhibiting significant anticipatory awareness deficits, stating that he has a doctor's appointment today and asking if he would be able to leave and come back. Discussed potential challenges navigating car transfers and mobility within the doctor's office with patient requiring max assist to indicate awareness of these potential difficulties. Patient was left in lowered bed with call bell in reach and bed alarm set. SLP will continue to target goals per plan of care.      Pain Pain Assessment Pain Scale: 0-10 Pain Score: 0-No pain  Therapy/Group: Individual Therapy  [name], M.A., CCC-SLP  [name] 03/12/2023, 8:57 AM

## 2023-03-12 NOTE — Progress Notes (Addendum)
 Physical Therapy Session Note  Patient Details  Name: Edward Rodgers MRN: [id number] Date of Birth: [birth year]  Today's Date: 03/12/2023 PT Individual Time: 1110-1205, 2130-8657 PT Individual Time Calculation (min): 55 min, 27 min   Short Term Goals: Week 3:  PT Short Term Goal 1 (Week 3): Pt will perform WC consult for TIS WC PT Short Term Goal 2 (Week 3): therapist will initiate hands on family training PT Short Term Goal 3 (Week 3): Pt will perform bed to chair transfer with mod A or less consistently PT Short Term Goal 4 (Week 3): pt will perform sit to stand with mod A or less consistently  Skilled Therapeutic Interventions/Progress Updates:      Treatment Session 1   Pt seated in TIS WC upon arrival. Pt agreeable to therapy. Pt denies any pain.   Treatment session focused on transfer training and neuromuscular reeducation.   Pt performed sit to stand x5 with no AD and mod A with max verbal cues provided for scooting forward, B UE/LE positioning, with therapist facilitating anterior weight shift for power up and in stance 2/2 posterior bias, with therapist positioned to L of pt and providing approximation to L UE on L knee, and R UE pushing up from WC arm rest.   Pt performed squat pivot transfer x1 each direction WC<>mat table with mod-max A, max verbal cues provided for technique.   Pt ambulated 2x15 feet with RW and +2 for WC follow, and 1x20 feet with B HHA and +3 for WC follow this session without colored dots, therapist utilized own foot for visual cue for correction of L LE scissoring pattern. Verbal and tactile cues provided for anterior/lateral weight shift for L LE advancement, and tactile cues for quad activation with therapist guarding for intermittent L LE knee buckling. With fatigue, pt requires min A for L LE advancement with verbal and tactile cues for hip and knee flexion. Pt would benefit from trialing DF assist ace wrap/toe cap next session for improved L LE  advancement.   Pt seated in TIS WC at end of session with all needs within reach and bed alarm on.   Treatment Session 2   Pt seated in TIS WC upon arrival. Pt very alert and attentive this session and looking at computer and conversing with his friend (the [employer]), pt has been awake and in [facility] since previous PT session. Pt able to recall names of therapists/techs Frankey Poot, Leane Platt) , the need to drink water frequently.   Pt demos improved recall of sequencing and cues for sit to stand. Pt performed mass practice of sit to stand from TIS WC with RW and L UE splint on RW, pt performed with min A progressing to CGA, with HOH assist for placement of L UE in splint, and min A for L LE positioning with cue "back, back, back it up -- for L LE positoining under pt--pt began singing the song during the session", pt counting 1,2,3 while performing anterior trunk lean, verbal cues provided for anterior weight shift during power up.   While standing, pt able perform cervical head rotation to L, to identify therapists, as well as number of fingers pt holding up, and reading commercials on the TV, with 100% accuracy, verbal cues provided for L cervical rotation.   Pt seated in TIS WC at end of session with all needs within reach and seatbelt alarm on.   Therapy Documentation Precautions:  Precautions Precautions: Fall Precaution/Restrictions Comments: L-hemi, L-inattention, pusher syndrome,  Loop recorder placed 2/7 Restrictions Weight Bearing Restrictions Per Provider Order: No   Therapy/Group: Individual Therapy  [facility] Ambrose Finland, [place], DPT  03/12/2023, 7:51 AM

## 2023-03-12 NOTE — Progress Notes (Signed)
 PROGRESS NOTE   Subjective/Complaints:  Patient working with therapy this morning.  Continues to have decreased appetite.  No new complaints or concerns.  Reports he slept well.  ROS: Patient denies fever, chills, dizziness, nausea, vomiting, diarrhea,  shortness of breath or chest pain, headache, or mood change. +poor appetite Insomnia- improved   Objective:   No results found.   Recent Labs    03/11/23 0528 03/12/23 0537  WBC 5.3 7.7  HGB 11.0* 10.5*  HCT 33.8* 32.5*  PLT 188 172    Recent Labs    03/11/23 0528 03/12/23 0537  NA 131* 133*  K 3.7 3.9  CL 103 101  CO2 22 22  GLUCOSE 105* 95  BUN 25* 25*  CREATININE 1.48* 1.44*  CALCIUM 9.0 8.9        Intake/Output Summary (Last 24 hours) at 03/12/2023 1031 Last data filed at 03/12/2023 0801 Gross per 24 hour  Intake 1141.84 ml  Output 200 ml  Net 941.84 ml         Physical Exam: Vital Signs Blood pressure (!) 143/64, pulse 73, temperature (!) 97.4 F (36.3 C), resp. rate 16, height 5\' 9"  (1.753 m), weight 88.8 kg, SpO2 96%.  Constitutional: No distress . Vital signs reviewed.  Working with therapy in the bed HEENT: NCAT, EOMI, oral membranes moist Neck: supple Cardiovascular: RRR without murmur. No JVD    Respiratory/Chest: CTA Bilaterally without wheezes or rales. Normal effort    GI/Abdomen: BS +, non-tender, non-distended Ext: no clubbing, cyanosis, or edema Psych: pleasant and cooperative, a little flat Skin:clean and dry Neuro:     He is awake and alert working with therapy.  Oriented to month, day, hospital, self.  Mildly slow to process. + decreased memory.  +mild confusion,  Slightly dysarthric speech.  Decreased insight and awareness.  Language overall appears to be intact.  Left central 7.  Decreased sensation to light touch in left upper extremity and left lower extremity.  Moving all 4 extremities to gravity and resistance Mildly  increased tone R hand present  -overall stable 2/27   Assessment/Plan: 1. Functional deficits which require 3+ hours per day of interdisciplinary therapy in a comprehensive inpatient rehab setting. Physiatrist is providing close team supervision and 24 hour management of active medical problems listed below. Physiatrist and rehab team continue to assess barriers to discharge/monitor patient progress toward functional and medical goals  Care Tool:  Bathing    Body parts bathed by patient: Left arm, Chest, Abdomen, Front perineal area, Right upper leg, Left upper leg, Right lower leg, Face   Body parts bathed by helper: Right arm, Buttocks, Left lower leg     Bathing assist Assist Level: Moderate Assistance - Patient 50 - 74%     Upper Body Dressing/Undressing Upper body dressing        Upper body assist Assist Level: Moderate Assistance - Patient 50 - 74%    Lower Body Dressing/Undressing Lower body dressing            Lower body assist Assist for lower body dressing: Maximal Assistance - Patient 25 - 49%     Toileting Toileting    Toileting assist Assist  for toileting: Maximal Assistance - Patient 25 - 49%     Transfers Chair/bed transfer  Transfers assist     Chair/bed transfer assist level: Moderate Assistance - Patient 50 - 74% (squat pivot/stand pivot)     Locomotion Ambulation   Ambulation assist   Ambulation activity did not occur: Safety/medical concerns  Assist level: 2 helpers (+2 for WC follow) Assistive device: Walker-rolling Max distance: 50 feet   Walk 10 feet activity   Assist  Walk 10 feet activity did not occur: Safety/medical concerns  Assist level: 2 helpers Assistive device: Walker-rolling   Walk 50 feet activity   Assist Walk 50 feet with 2 turns activity did not occur: Safety/medical concerns  Assist level: 2 helpers Assistive device: Walker-rolling    Walk 150 feet activity   Assist Walk 150 feet activity did  not occur: Safety/medical concerns         Walk 10 feet on uneven surface  activity   Assist Walk 10 feet on uneven surfaces activity did not occur: Safety/medical concerns         Wheelchair     Assist Is the patient using a wheelchair?: Yes Type of Wheelchair: Manual    Wheelchair assist level: Minimal Assistance - Patient > 75% Max wheelchair distance: 50    Wheelchair 50 feet with 2 turns activity    Assist        Assist Level: Minimal Assistance - Patient > 75%   Wheelchair 150 feet activity     Assist      Assist Level: Maximal Assistance - Patient 25 - 49%   Blood pressure (!) 143/64, pulse 73, temperature (!) 97.4 F (36.3 C), resp. rate 16, height 5\' 9"  (1.753 m), weight 88.8 kg, SpO2 96%.  Medical Problem List and Plan: 1. 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  Await plan for loop recorder             -patient may shower -ELOS/Goals: 21-25 days, min assist with PT, OT and supervision with SLP -Continue CIR therapies including PT, OT, and SLP  -Discharge date adjusted to 3/15   2.  Antithrombotics: -DVT/anticoagulation:  Pharmaceutical: eliquis per pharmacy -03/07/23 now on eliquis per pharmacy -antiplatelet therapy: Aspirin 81 mg daily and Plavix 75 mg day x 3 weeks then Plavix alone -2/20 DC aspirin and Plavix as on heparin, discussed with neurology and cardiology-- 2/22 now on eliquis  3. Pain Management: Lidoderm patch as directed, Tylenol as needed 4. Mood/Sleep/sundowning: Effexor 150 mg every evening, provide emotional support             -antipsychotic agents: Seroquel 50 mg nighty at 2030, have back up dose             -keep sleep chart 5. Neuropsych/cognition: This patient is capable of making decisions on his own behalf. 6. Skin/Wound Care: Routine skin checks 7. Fluids/Electrolytes/Nutrition: Routine in and outs with follow-up chemistries,  continue vitamins/supplements 8.  Hypothyroidism.  Synthroid 61mcg/day 9.  Hypertension. Norvasc 10 mg daily, Toprol-Xl 100 mg daily, Aldactone 25 mg daily.             -bp trending up. Likely will need additions to regimen  -02/22/23 BPs 140s/90s, not terrible, monitor trend with increase mobility  2/10 BP controlled, decrease spironolactone due to elevated CR/BUN -02/28/23 BP stable but creeping up, continue to monitor for now but may need to increase meds back -2/25 BP stable, hold  losartan for now due to renal function -2/27 BP overall stable, continue current regimen  Vitals:   03/09/23 0449 03/09/23 1136 03/09/23 1512 03/09/23 1945  BP: (!) 143/88 (!) 126/91 (!) 142/88 124/83   03/10/23 0425 03/10/23 1558 03/10/23 1955 03/11/23 0440  BP: 122/70 127/84 118/86 (!) 141/85   03/11/23 1319 03/11/23 1926 03/12/23 0310 03/12/23 0326  BP: 118/82 (!) 163/92 106/74 (!) 143/64    10.H/O prostate cancer. Myrbetriq 50 mg daily. 11.Hyperlipidemia. Lipitor 80mg  daily 12.Constipation.             -miralax daily, SenokotS 1 tab daily -02/22/23 LBM 2days ago, monitor today, if no BM tomorrow then may want to increase regimen -2/25 LBM 2/23, consider addtional medication if no bowel movement today -2/27 LBM today, continue to follow 13.H/O gout. Zyloprim 300mg  daily 14.  AKI versus CKD  -Encourage oral fluid intake, decrease spironolactone to 12.5 mg  -2/11 recheck BMP today  Addendum- increased Cr, stop spironolactone, start IVF NS 24ml/HR  -2/12 Cr slightly improved, continue IVF, encourage oral fluids  -2/13 recheck BMP- CR down to 1.2  -2/14 DC IVF, discussed oral hydration  2/17 BUN/CR stable 17/1.25  -2/18 continue to encourage oral hydration -2/20 BUN and creatinine improved to 21/1.25, got IV fluids last night 4ml/hr   2/21 Encourage PO fluid intake  -03/07/23 Cr 0.99 today, poor PO intake, a little dizzy this morning, will give IVFs @50ml /hr x12hrs now, and have standing PRN order for  63ml/hr x12hrs at bedtime if PO intake <1272ml/day -03/08/23 doing much better today after IVFs; leave standing order on for now, but encouraged pt to drink water throughout the day  2/24---give additional IVF today as Cr sl up  2/25 hold losartan, will hold off on IVF for now and encourage oral hydration.  Caution with IVF as x-ray chest with increased pulmonary congestion  2/27 50 mL/h IVF, gentle rate due to CHF history, started yesterday, continue today, creatinine slightly improved 15.  History of CHF.  Last EF 55%, mild diastolic dysfunction  -Continue metoprolol, decrease spironolactone to 12.5 mg as above -2/14 Wts stable, has not appear to show signs of fluid overload, continue to monitor -02/28/23 wt up but doubt accuracy, monitor, no overload on exam -2/19-20 weight stable, does not appear to show signs of fluid overload, EF 40 to 45% on echo. -Cardiology has been following, they discontinued amlodipine and started losartan 25 mg daily-beginning tomorrow-- signing off 2/22 (see recs below)  -Follow-up with cardiology at the [facility] postdischarge  -2/27 weight is overall stable, continue to monitor Filed Weights   03/10/23 0428 03/11/23 1314 03/12/23 0319  Weight: 87.7 kg 91.1 kg 88.8 kg    16.  Severe cervical stenosis: Consider neurosurgery outpatient follow-up 17. Insomnia -Patient has Seroquel 50 mg scheduled and 25 mg as needed.  Will also add in 3 mg melatonin at bedtime  -2/11 increase Seroquel to 75 mg nightly  -2/13 asked nursing to complete sleep-wake chart  -2/14 reports sleeping better, continue current regimen for now and monitor  -2/20 will decrease Seroquel back to 50 mg nightly and 25 mg at bedtime  2/21 Decrease seroquel to 25mg  nightly and 25mg  PRN  2/25 increase Seroquel scheduled back to 25 nightly and 25 as needed  2/27 sleep continues to be improved, continue current regimen  18.  Bleeding from his penis  -UA neg,HGB was stable on 2/11 -Denies additional  occurrences.  During team meeting was noted this happened intermittently during acute hospital stay  19. Nasal congestion: Flonase 1 spray daily  20. Staring episodes/vision changes 03/01/23 -03/01/23 having staring episodes and ?chronic vision changes/hemianopsia? Spoke with Dr. Derry Lory of neuro, will get 24hr EEG and head CT, no code stroke at this point; they will see pt, appreciate their assistance.  -2/17 overnight EEG neg, CT head without acute changes  21. Chest pain  -EKG, Trop, CXR ordered  -Cardiology following appreciate assistance.  D-dimer positive, getting CT PE study to rule out PE.  If positive will need to discuss with neurology regarding anticoagulation.  He is now on 2 L nasal cannula oxygen  -2/20 patient reports his chest pain is resolved  22. Pulmonary embolism / DVT left popliteal vein -2/19 discussed with neurology and cardiology. Heparin grip started, wife updated by phone. Critical care medicine consulted 2/20 seen by critical care, continue heparin eventual transition to DOAC, if hemodynamic changes contact PCCM again. Advised 24h bedrest with [facility] 2/21 plan to transition to DOAC later today, discussed with pharmacy by phone  -03/07/23 cardiology saw pt, recommend eliquis per protolocol, continue toprol/low dose losartan, and they signed off-- f/up in 3-4wks with Dr. Odis Hollingshead  23. Leukocytosis: 13k today  -SLP felt that he was having more difficulties from a cognitive standpoint today  -he is afebrile. No cough or respiratory sx  -will check UA, UCX- UA does not indicate infection  -recheck CBC in AM  -CXR yesterday with pulmonary vascular congestion  -WBC down to 7.5 2/25  24. Mild hyponatremia, mild continue to monitor   -2/27 sodium up to 133, recheck tomorrow   LOS: 19 days A FACE TO FACE EVALUATION WAS PERFORMED  Fanny Dance 03/12/2023, 10:31 AM

## 2023-03-12 NOTE — Progress Notes (Signed)
 Occupational Therapy Session Note  Patient Details  Name: Edward Rodgers MRN: [id number] Date of Birth: 06-04-1950  {CHL IP REHAB OT TIME CALCULATIONS:[id number]}   Short Term Goals: Week 3:  OT Short Term Goal 1 (Week 3): STGs=LTGs due to patient's estimated length of stay.  Skilled Therapeutic Interventions/Progress Updates:    Patient agreeable to participate in OT session. Reports *** pain level.   Patient participated in skilled OT session focusing on ***. 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

## 2023-03-13 DIAGNOSIS — E871 Hypo-osmolality and hyponatremia: Secondary | ICD-10-CM

## 2023-03-13 LAB — BASIC METABOLIC PANEL
Anion gap: 7 (ref 5–15)
BUN: 19 mg/dL (ref 8–23)
CO2: 22 mmol/L (ref 22–32)
Calcium: 8.9 mg/dL (ref 8.9–10.3)
Chloride: 106 mmol/L (ref 98–111)
Creatinine, Ser: 1.21 mg/dL (ref 0.61–1.24)
GFR, Estimated: >60 mL/min (ref >60)
Glucose, Bld: 91 mg/dL (ref 70–99)
Potassium: 3.5 mmol/L (ref 3.5–5.1)
Sodium: 135 mmol/L (ref 135–145)

## 2023-03-13 MED ORDER — HYALURONIDASE HUMAN 150 UNIT/ML IJ SOLN
150.0000 [IU] | Freq: Once | INTRAMUSCULAR | Status: AC
  Administered 2023-03-13: 150 [IU] via SUBCUTANEOUS
  Filled 2023-03-13: qty 1

## 2023-03-13 MED ORDER — POTASSIUM CHLORIDE IN NACL 20-0.9 MEQ/L-% IV SOLN: INTRAVENOUS | Status: DC

## 2023-03-13 MED ORDER — LOSARTAN POTASSIUM 25 MG PO TABS
25.0000 mg | ORAL_TABLET | Freq: Every day | ORAL | Status: DC
  Administered 2023-03-13 – 2023-03-16 (×4): 25 mg via ORAL
  Filled 2023-03-13 (×4): qty 1

## 2023-03-13 MED ORDER — POTASSIUM CHLORIDE IN NACL 20-0.9 MEQ/L-% IV SOLN
INTRAVENOUS | Status: AC
  Filled 2023-03-13: qty 1000

## 2023-03-13 NOTE — Progress Notes (Signed)
 Physical Therapy Session Note  Patient Details  Name: Edward Rodgers MRN: [id number] Date of Birth: [birth year]  Today's Date: 03/13/2023 PT Individual Time: 1035-1108 PT Individual Time Calculation (min): 33 min   Short Term Goals: Week 2:  PT Short Term Goal 1 (Week 2): pt will perform bed mobility with CGA or less PT Short Term Goal 1 - Progress (Week 2): Progressing toward goal PT Short Term Goal 2 (Week 2): pt will ambulate 100 feet with RW and mod A or less PT Short Term Goal 2 - Progress (Week 2): Partly met PT Short Term Goal 3 (Week 2): pt will perform car transfer with mod A or less with LRAD PT Short Term Goal 3 - Progress (Week 2): Not progressing Week 3:  PT Short Term Goal 1 (Week 3): Pt will perform WC consult for TIS WC PT Short Term Goal 2 (Week 3): therapist will initiate hands on family training PT Short Term Goal 3 (Week 3): Pt will perform bed to chair transfer with mod A or less consistently PT Short Term Goal 4 (Week 3): pt will perform sit to stand with mod A or less consistently  Skilled Therapeutic Interventions/Progress Updates:  Patient seated upright in TIS w/c on entrance to room. Patient alert and agreeable to PT session. Pt's brother present.   Patient with no pain complaint at start of session.  Therapeutic Activity: Transfers: Pt performed sit<>stand transfers throughout session with MinA and assisted L hand with placement and push from w/c armrest prior to placement on L hand saddle splint. Provided vc/ tc for setup of BLE with more posterior foot placement for improved weight shift during hip hinge for forward lean. Cueing provided at ribcage for facilitation of upright posturing.  Gait Training:  Pt ambulated 67ft using RW with L hand saddle splint with up to ModA to maintain balance d/t L lean. Pt's brother managing w/c follow as well as IV pole mgmt. Demonstrated slow initiation with BLE advancement. Provided vc/ tc throughout for sequencing of  direction of lean with contralateral unweighting for LE advancement, for increased motor control of L knee extension during stance phase, for increased knee height to improve L foot clearance during swing phase, and for increased control of widening BOS with more lateral foot placement with LLE initial contact - cued to step toward L front wheel of RW. Required assist for RW mgmt intermittently. Decreased pace and step initiation as compared to handrail support for RUE.   Patient seated upright in w/c  at end of session with brakes locked, belt alarm set, and all needs within reach. Education provided to pt and brother re: potential of pt not being functional ambulator at home but continued focus on ALL mobility in order to improve LOA required in transfers for decreased BOC.    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   Therapy/Group: Individual Therapy  Loel Dubonnet PT, DPT, CSRS 03/13/2023, 6:25 PM

## 2023-03-13 NOTE — Plan of Care (Signed)
  Problem: Consults Goal: RH STROKE PATIENT EDUCATION Description: See Patient Education module for education specifics  Outcome: Progressing   Problem: RH BOWEL ELIMINATION Goal: RH STG MANAGE BOWEL WITH ASSISTANCE Description: STG Manage Bowel with min Assistance. Outcome: Progressing   Problem: RH BOWEL ELIMINATION Goal: RH STG MANAGE BOWEL W/MEDICATION W/ASSISTANCE Description: STG Manage Bowel with Medication with mod I  Assistance. Outcome: Progressing   Problem: RH BLADDER ELIMINATION Goal: RH STG MANAGE BLADDER WITH ASSISTANCE Description: STG Manage Bladder With min Assistance Outcome: Progressing   Problem: RH BLADDER ELIMINATION Goal: RH STG MANAGE BLADDER WITH MEDICATION WITH ASSISTANCE Description: STG Manage Bladder With Medication With mod I Assistance. Outcome: Progressing   Problem: RH SKIN INTEGRITY Goal: RH STG SKIN FREE OF INFECTION/BREAKDOWN Description: Manage skin w min assist Outcome: Progressing   Problem: RH KNOWLEDGE DEFICIT Goal: RH STG INCREASE KNOWLEGDE OF HYPERLIPIDEMIA Description: Patient and wife will be able to manage HLD using educational resources for medications and dietary modification independently Outcome: Progressing   Problem: RH KNOWLEDGE DEFICIT Goal: RH STG INCREASE KNOWLEDGE OF DIABETES Description: Patient and wife will be able to manage prediabetes using educational resources for medications and dietary modification independently Outcome: Progressing

## 2023-03-13 NOTE — Plan of Care (Signed)
  Problem: Consults Goal: RH STROKE PATIENT EDUCATION Description: See Patient Education module for education specifics  Outcome: Progressing   Problem: RH BOWEL ELIMINATION Goal: RH STG MANAGE BOWEL WITH ASSISTANCE Description: STG Manage Bowel with min Assistance. Outcome: Progressing Goal: RH STG MANAGE BOWEL W/MEDICATION W/ASSISTANCE Description: STG Manage Bowel with Medication with mod I  Assistance. Outcome: Progressing   Problem: RH BLADDER ELIMINATION Goal: RH STG MANAGE BLADDER WITH ASSISTANCE Description: STG Manage Bladder With min Assistance Outcome: Progressing Goal: RH STG MANAGE BLADDER WITH MEDICATION WITH ASSISTANCE Description: STG Manage Bladder With Medication With mod I Assistance. Outcome: Progressing   Problem: RH SKIN INTEGRITY Goal: RH STG SKIN FREE OF INFECTION/BREAKDOWN Description: Manage skin w min assist Outcome: Progressing Goal: RH STG MAINTAIN SKIN INTEGRITY WITH ASSISTANCE Description: STG Maintain Skin Integrity With min Assistance. Outcome: Progressing   Problem: RH SAFETY Goal: RH STG ADHERE TO SAFETY PRECAUTIONS W/ASSISTANCE/DEVICE Description: STG Adhere to Safety Precautions With cues  Assistance/Device. Outcome: Progressing   Problem: RH COGNITION-NURSING Goal: RH STG USES MEMORY AIDS/STRATEGIES W/ASSIST TO PROBLEM SOLVE Description: STG Uses Memory Aids/Strategies With cues Assistance to Problem Solve. Outcome: Progressing   Problem: RH KNOWLEDGE DEFICIT Goal: RH STG INCREASE KNOWLEDGE OF DIABETES Description: Patient and wife will be able to manage prediabetes using educational resources for medications and dietary modification independently Outcome: Progressing Goal: RH STG INCREASE KNOWLEDGE OF HYPERTENSION Description: Patient and wife will be able to manage HTN using educational resources for medications and dietary modification independently Outcome: Progressing Goal: RH STG INCREASE KNOWLEGDE OF  HYPERLIPIDEMIA Description: Patient and wife will be able to manage HLD using educational resources for medications and dietary modification independently Outcome: Progressing Goal: 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

## 2023-03-13 NOTE — Progress Notes (Signed)
 Physical Therapy Session Note  Patient Details  Name: Edward Rodgers MRN: [id number] Date of Birth: 09/01/1950  Today's Date: 03/13/2023 PT Individual Time: 0806-0811 PT Individual Time: 8295-6213 PT Individual Time Calculation (min): 43 min  and Today's Date: 03/13/2023 PT Missed Time: 24 Minutes Missed Time Reason: Other (Comment) (Completing breakfast - late meal arrival)  Short Term Goals: Week 2:  PT Short Term Goal 1 (Week 2): pt will perform bed mobility with CGA or less PT Short Term Goal 1 - Progress (Week 2): Progressing toward goal PT Short Term Goal 2 (Week 2): pt will ambulate 100 feet with RW and mod A or less PT Short Term Goal 2 - Progress (Week 2): Partly met PT Short Term Goal 3 (Week 2): pt will perform car transfer with mod A or less with LRAD PT Short Term Goal 3 - Progress (Week 2): Not progressing Week 3:  PT Short Term Goal 1 (Week 3): Pt will perform WC consult for TIS WC PT Short Term Goal 2 (Week 3): therapist will initiate hands on family training PT Short Term Goal 3 (Week 3): Pt will perform bed to chair transfer with mod A or less consistently PT Short Term Goal 4 (Week 3): pt will perform sit to stand with mod A or less consistently  Skilled Therapeutic Interventions/Progress Updates:  Patient supine in bed and eating breakfast on entrance to room. Patient alert and agreeable to PT session. Requests to complete breakfast prior to OOB activities. Allowed to complete. Returned after RN medication provision.   Patient with no pain complaint at start of session.  Therapeutic Activity/ NMR: Bed Mobility: Pt performed supine <> sit with max cues and overall MinA.  Transfers: Pt performed sit<>stand transfers throughout session with MinA with RUE support. Squat pivot transfers completed to either side with vc for setup and MinA. Stand pivot transfers with max cues for setup and Min/ ModA for step sequencing and technique overall.  Blocked practice of sit<>stand  with focus on R sided weight shift during rise and descent. Improves in ability to lean to R or to hold midline in rise but continues to lean to L during descent. Provided with vc with min facilitation initially for all with MinA.   NMR performed for improvements in motor control and coordination, balance, sequencing, judgement, and self confidence/ efficacy in performing all aspects of mobility at highest level of independence.   Gait Training:  Pt ambulated 63ft using RHR with Min/ ModA to maintain balance d/t L lean. +2 for w/c follow and IV pole mgmt. Used RHR in order to promote increased R lean. Demonstrated slow initiation with BLE advancement. Provided vc/ tc throughout for sequencing of direction of lean with contralateral unweighting for LE advancement, for increased motor control of L knee extension during stance phase, for increased knee height to improve L foot clearance during swing phase, and for increased control of widening BOS with more lateral foot placement with LLE initial contact.   Patient seated upright in TIS w/c at end of session with brakes locked, belt alarm set, and all needs within reach.  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   Therapy/Group: Individual Therapy  Loel Dubonnet  PT, DPT, CSRS 03/13/2023, 6:20 PM

## 2023-03-13 NOTE — Progress Notes (Signed)
 PROGRESS NOTE   Subjective/Complaints:  No new complaints of concerns this AM. Cr looking better today.  Reports he slept well last night.   ROS: Patient denies fever,  vision changes,  dizziness, nausea, vomiting, diarrhea,  shortness of breath or chest pain, headache, or mood change. +poor appetite Insomnia- improved   Objective:   No results found.   Recent Labs    03/11/23 0528 03/12/23 0537  WBC 5.3 7.7  HGB 11.0* 10.5*  HCT 33.8* 32.5*  PLT 188 172    Recent Labs    03/12/23 0537 03/13/23 0645  NA 133* 135  K 3.9 3.5  CL 101 106  CO2 22 22  GLUCOSE 95 91  BUN 25* 19  CREATININE 1.44* 1.21  CALCIUM 8.9 8.9        Intake/Output Summary (Last 24 hours) at 03/13/2023 1224 Last data filed at 03/13/2023 0655 Gross per 24 hour  Intake 1755 ml  Output 750 ml  Net 1005 ml         Physical Exam: Vital Signs Blood pressure (!) 152/92, pulse 81, temperature 97.9 F (36.6 C), resp. rate 19, height 5\' 9"  (1.753 m), weight 88.8 kg, SpO2 95%.  Constitutional: No distress . Vital signs reviewed.  Laying in bed, appears comfortable HEENT: NCAT, EOMI, oral membranes moist Neck: supple Cardiovascular: RRR Respiratory/Chest: CTA Bilaterally, on room air   GI/Abdomen: BS +, non-tender, non-distended Ext: no clubbing, cyanosis, or edema Psych: pleasant and cooperative Skin:clean and dry Neuro:     He is awake and alert.   Oriented to month, day, hospital, self.  Mildly slow to process. + decreased memory.  +mild confusion,  Slightly dysarthric speech.  Decreased insight and awareness.  Language overall appears to be intact.  Left central 7.  Decreased sensation to light touch in left upper extremity and left lower extremity.  Moving all 4 extremities to gravity and resistance Mildly increased tone R hand present  -overall stable 2/28   Assessment/Plan: 1. Functional deficits which require 3+ hours per day  of interdisciplinary therapy in a comprehensive inpatient rehab setting. Physiatrist is providing close team supervision and 24 hour management of active medical problems listed below. Physiatrist and rehab team continue to assess barriers to discharge/monitor patient progress toward functional and medical goals  Care Tool:  Bathing    Body parts bathed by patient: Left arm, Chest, Abdomen, Front perineal area, Right upper leg, Left upper leg, Right lower leg, Face   Body parts bathed by helper: Right arm, Buttocks, Left lower leg     Bathing assist Assist Level: Moderate Assistance - Patient 50 - 74%     Upper Body Dressing/Undressing Upper body dressing        Upper body assist Assist Level: Moderate Assistance - Patient 50 - 74%    Lower Body Dressing/Undressing Lower body dressing            Lower body assist Assist for lower body dressing: Maximal Assistance - Patient 25 - 49%     Toileting Toileting    Toileting assist Assist for toileting: Maximal Assistance - Patient 25 - 49%     Transfers Chair/bed transfer  Transfers assist  Chair/bed transfer assist level: Moderate Assistance - Patient 50 - 74% (squat pivot/stand pivot)     Locomotion Ambulation   Ambulation assist   Ambulation activity did not occur: Safety/medical concerns  Assist level: 2 helpers (+2 for WC follow) Assistive device: Walker-rolling Max distance: 50 feet   Walk 10 feet activity   Assist  Walk 10 feet activity did not occur: Safety/medical concerns  Assist level: 2 helpers Assistive device: Walker-rolling   Walk 50 feet activity   Assist Walk 50 feet with 2 turns activity did not occur: Safety/medical concerns  Assist level: 2 helpers Assistive device: Walker-rolling    Walk 150 feet activity   Assist Walk 150 feet activity did not occur: Safety/medical concerns         Walk 10 feet on uneven surface  activity   Assist Walk 10 feet on uneven  surfaces activity did not occur: Safety/medical concerns         Wheelchair     Assist Is the patient using a wheelchair?: Yes Type of Wheelchair: Manual    Wheelchair assist level: Minimal Assistance - Patient > 75% Max wheelchair distance: 50    Wheelchair 50 feet with 2 turns activity    Assist        Assist Level: Minimal Assistance - Patient > 75%   Wheelchair 150 feet activity     Assist      Assist Level: Maximal Assistance - Patient 25 - 49%   Blood pressure (!) 152/92, pulse 81, temperature 97.9 F (36.6 C), resp. rate 19, height 5\' 9"  (1.753 m), weight 88.8 kg, SpO2 95%.  Medical Problem List and Plan: 1. 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  Await plan for loop recorder             -patient may shower -ELOS/Goals: 21-25 days, min assist with PT, OT and supervision with SLP -Continue CIR therapies including PT, OT, and SLP  -Discharge date adjusted to 3/15   2.  Antithrombotics: -DVT/anticoagulation:  Pharmaceutical: eliquis per pharmacy -03/07/23 now on eliquis per pharmacy -antiplatelet therapy: Aspirin 81 mg daily and Plavix 75 mg day x 3 weeks then Plavix alone -2/20 DC aspirin and Plavix as on heparin, discussed with neurology and cardiology-- 2/22 now on eliquis  3. Pain Management: Lidoderm patch as directed, Tylenol as needed 4. Mood/Sleep/sundowning: Effexor 150 mg every evening, provide emotional support             -antipsychotic agents: Seroquel 50 mg nighty at 2030, have back up dose             -keep sleep chart 5. Neuropsych/cognition: This patient is capable of making decisions on his own behalf. 6. Skin/Wound Care: Routine skin checks 7. Fluids/Electrolytes/Nutrition: Routine in and outs with follow-up chemistries, continue vitamins/supplements 8.  Hypothyroidism.  Synthroid 12mcg/day 9.  Hypertension. Norvasc 10 mg daily, Toprol-Xl 100 mg  daily, Aldactone 25 mg daily.             -bp trending up. Likely will need additions to regimen  -02/22/23 BPs 140s/90s, not terrible, monitor trend with increase mobility  2/10 BP controlled, decrease spironolactone due to elevated CR/BUN -02/28/23 BP stable but creeping up, continue to monitor for now but may need to increase meds back -2/25 BP stable, hold losartan for now due to renal function -2/28 BP a little higher, restart low dose losartan 25mg  daily  Vitals:   03/10/23 1558  03/10/23 1955 03/11/23 0440 03/11/23 1319  BP: 127/84 118/86 (!) 141/85 118/82   03/11/23 1926 03/12/23 0310 03/12/23 0326 03/12/23 1322  BP: (!) 163/92 106/74 (!) 143/64 121/87   03/12/23 1915 03/13/23 0424 03/13/23 0839 03/13/23 0840  BP: (!) 142/81 (!) 142/97 (!) 152/92 (!) 152/92    10.H/O prostate cancer. Myrbetriq 50 mg daily. 11.Hyperlipidemia. Lipitor 80mg  daily 12.Constipation.             -miralax daily, SenokotS 1 tab daily -02/22/23 LBM 2days ago, monitor today, if no BM tomorrow then may want to increase regimen -2/25 LBM 2/23, consider addtional medication if no bowel movement today -2/28 LBM today, continue to monitor 13.H/O gout. Zyloprim 300mg  daily 14.  AKI versus CKD  -Encourage oral fluid intake, decrease spironolactone to 12.5 mg  -2/11 recheck BMP today  Addendum- increased Cr, stop spironolactone, start IVF NS 59ml/HR  -2/12 Cr slightly improved, continue IVF, encourage oral fluids  -2/13 recheck BMP- CR down to 1.2  -2/14 DC IVF, discussed oral hydration  2/17 BUN/CR stable 17/1.25  -2/18 continue to encourage oral hydration -2/20 BUN and creatinine improved to 21/1.25, got IV fluids last night 89ml/hr   2/21 Encourage PO fluid intake  -03/07/23 Cr 0.99 today, poor PO intake, a little dizzy this morning, will give IVFs @50ml /hr x12hrs now, and have standing PRN order for 64ml/hr x12hrs at bedtime if PO intake <1253ml/day -03/08/23 doing much better today after IVFs; leave standing  order on for now, but encouraged pt to drink water throughout the day  2/24---give additional IVF today as Cr sl up  2/25 hold losartan, will hold off on IVF for now and encourage oral hydration.  Caution with IVF as x-ray chest with increased pulmonary congestion  2/27 50 mL/h IVF, gentle rate due to CHF history, started yesterday, continue today, creatinine slightly improved  2/28 Change IVF NS with potassium  to at night only, Cr and Bun improved. Can hold if drinking > fluids that day 15.  History of CHF.  Last EF 55%, mild diastolic dysfunction  -Continue metoprolol, decrease spironolactone to 12.5 mg as above -2/14 Wts stable, has not appear to show signs of fluid overload, continue to monitor -02/28/23 wt up but doubt accuracy, monitor, no overload on exam -2/19-20 weight stable, does not appear to show signs of fluid overload, EF 40 to 45% on echo. -Cardiology has been following, they discontinued amlodipine and started losartan 25 mg daily-beginning tomorrow-- signing off 2/22 (see recs below)  -Follow-up with cardiology at the [facility] postdischarge  -2/27 weight is overall stable, continue to monitor  -2/28 restart losartan Filed Weights   03/11/23 1314 03/12/23 0319 03/13/23 0600  Weight: 91.1 kg 88.8 kg 88.8 kg    16.  Severe cervical stenosis: Consider neurosurgery outpatient follow-up 17. Insomnia -Patient has Seroquel 50 mg scheduled and 25 mg as needed.  Will also add in 3 mg melatonin at bedtime  -2/11 increase Seroquel to 75 mg nightly  -2/13 asked nursing to complete sleep-wake chart  -2/14 reports sleeping better, continue current regimen for now and monitor  -2/20 will decrease Seroquel back to 50 mg nightly and 25 mg at bedtime  2/21 Decrease seroquel to 25mg  nightly and 25mg  PRN  2/25 increase Seroquel scheduled back to 25 nightly and 25 as needed  2/28 Pt reports sleeping better, continue current  18.  Bleeding from his penis  -UA neg,HGB was stable on  2/11 -Denies additional occurrences.  During team meeting was  noted this happened intermittently during acute hospital stay  19. Nasal congestion: Flonase 1 spray daily  20. Staring episodes/vision changes 03/01/23 -03/01/23 having staring episodes and ?chronic vision changes/hemianopsia? Spoke with Dr. Derry Lory of neuro, will get 24hr EEG and head CT, no code stroke at this point; they will see pt, appreciate their assistance.  -2/17 overnight EEG neg, CT head without acute changes  21. Chest pain  -EKG, Trop, CXR ordered  -Cardiology following appreciate assistance.  D-dimer positive, getting CT PE study to rule out PE.  If positive will need to discuss with neurology regarding anticoagulation.  He is now on 2 L nasal cannula oxygen  -2/20 patient reports his chest pain is resolved  22. Pulmonary embolism / DVT left popliteal vein -2/19 discussed with neurology and cardiology. Heparin grip started, wife updated by phone. Critical care medicine consulted 2/20 seen by critical care, continue heparin eventual transition to DOAC, if hemodynamic changes contact PCCM again. Advised 24h bedrest with [facility] 2/21 plan to transition to DOAC later today, discussed with pharmacy by phone  -03/07/23 cardiology saw pt, recommend eliquis per protolocol, continue toprol/low dose losartan, and they signed off-- f/up in 3-4wks with Dr. Odis Hollingshead  23. Leukocytosis: 13k today  -SLP felt that he was having more difficulties from a cognitive standpoint today  -he is afebrile. No cough or respiratory sx  -will check UA, UCX- UA does not indicate infection  -recheck CBC in AM  -CXR yesterday with pulmonary vascular congestion  -WBC down to 7.5 2/25  24. Mild hyponatremia, mild continue to monitor   -2/28 improved today to 135   LOS: 20 days A FACE TO FACE EVALUATION WAS PERFORMED  Fanny Dance 03/13/2023, 12:24 PM

## 2023-03-13 NOTE — Progress Notes (Signed)
 Speech Language Pathology Daily Session Note  Patient Details  Name: Edward Rodgers MRN: [id number] Date of Birth: 16-Feb-1950  Today's Date: 03/13/2023 SLP Individual Time: 4098-1191 SLP Individual Time Calculation (min): 39 min  Short Term Goals: Week 3: SLP Short Term Goal 1 (Week 3): STGs=LTGs d/t ELOS  Skilled Therapeutic Interventions: SLP conducted skilled therapy session targeting cognitive retraining goals. Patient exhibits improvements to memory of daily events and sustained attention compared to previous session, benefiting from min assist for event recall and supervision for sustained attention to conversation and questions. During task involving understanding, predicting, and recalling time based on verbally presented problem solving prompts, patient benefited from min to max cues depending on question complexity and increased processing time to achieve 80% accuracy. Patient was left in chair with call bell in reach and chair alarm set. SLP will continue to target goals per plan of care.       Pain Pain Assessment Pain Scale: 0-10 Pain Score: 0-No pain  Therapy/Group: Individual Therapy  [name], M.A., CCC-SLP  [name] 03/13/2023, 3:04 PM

## 2023-03-14 NOTE — Progress Notes (Signed)
 Physical Therapy Session Note  Patient Details  Name: Edward Rodgers MRN: [id number] Date of Birth: 09-Aug-1950  Today's Date: 03/14/2023 PT Individual Time: 4098-1191 PT Individual Time Calculation (min): 48 min   Short Term Goals: Week 1:  PT Short Term Goal 1 (Week 1): pt will perfrom sit to stand with LRAD and mod A PT Short Term Goal 1 - Progress (Week 1): Met PT Short Term Goal 2 (Week 1): pt will perform bed to chair transfer with LRAD and mod A PT Short Term Goal 2 - Progress (Week 1): Met PT Short Term Goal 3 (Week 1): Pt will perform bed mobility with LRAD and mod A PT Short Term Goal 3 - Progress (Week 1): Met PT Short Term Goal 4 (Week 1): pt will ambulate 50 feet with LRAD and +2 A PT Short Term Goal 4 - Progress (Week 1): Met Week 2:  PT Short Term Goal 1 (Week 2): pt will perform bed mobility with CGA or less PT Short Term Goal 1 - Progress (Week 2): Progressing toward goal PT Short Term Goal 2 (Week 2): pt will ambulate 100 feet with RW and mod A or less PT Short Term Goal 2 - Progress (Week 2): Partly met PT Short Term Goal 3 (Week 2): pt will perform car transfer with mod A or less with LRAD PT Short Term Goal 3 - Progress (Week 2): Not progressing  Skilled Therapeutic Interventions/Progress Updates:    Session focused on NMR to facilitate transitional movements at bed level and with sit <> stands and transfers. Pt initiated BLE movement to "warm up" in prep for moving and comes to EOB with  min assist and cues for technique. Once EOB sits with initial steadying assist to facilitate anterior weightshift due to posterior bias. Dons pants and shirt with mod assist overall and cues for technique and assist with management of LUE. Sit > stand with mod assist for facilitation of weightshift and cues to push toes into ground once in standing for more upright posture and continues with posterior bias - then able to maintain balance with min progressing to supervision. Performed  stand step transfer with RW and L hand splint to w/c with min assist and cues for attention to L foot placement, stepping backwards (difficult with LLE) and eccentric control to the w/c. Focused on sit <> stand again with focus on mechanics, anterior weightshift and achieving balance once up in standing again with min assist for balance progressing to close supervision. Left up in w/c with all needs in reach and safety belt donned.   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 Restrictions Weight Bearing Restrictions Per Provider Order: No    Pain: No reports of pain.    Therapy/Group: Individual Therapy  Karolee Stamps Darrol Poke, PT, DPT, CBIS  03/14/2023, 12:31 PM

## 2023-03-14 NOTE — Progress Notes (Signed)
 PROGRESS NOTE   Subjective/Complaints:  Pt doing well, eating breakfast. Slept well. Denies pain. LBM early this morning. Urinating fine. Denies any other complaints or concerns.   ROS: Patient denies fever,  vision changes,  dizziness, nausea, vomiting, diarrhea,  shortness of breath or chest pain, headache, or mood change. +poor appetite--improving Insomnia- improved   Objective:   No results found.   Recent Labs    03/12/23 0537  WBC 7.7  HGB 10.5*  HCT 32.5*  PLT 172    Recent Labs    03/12/23 0537 03/13/23 0645  NA 133* 135  K 3.9 3.5  CL 101 106  CO2 22 22  GLUCOSE 95 91  BUN 25* 19  CREATININE 1.44* 1.21  CALCIUM 8.9 8.9        Intake/Output Summary (Last 24 hours) at 03/14/2023 1036 Last data filed at 03/14/2023 0754 Gross per 24 hour  Intake 1103.8 ml  Output 700 ml  Net 403.8 ml         Physical Exam: Vital Signs Blood pressure (!) 161/88, pulse 68, temperature (!) 97.3 F (36.3 C), resp. rate 17, height 5\' 9"  (1.753 m), weight 89.5 kg, SpO2 95%.  Constitutional: No distress . Vital signs reviewed.  Sitting up in bed, appears comfortable, eating breakfast HEENT: NCAT, EOMI, oral membranes moist Neck: supple Cardiovascular: RRR Respiratory/Chest: CTA Bilaterally, on room air   GI/Abdomen: BS +, non-tender, non-distended Ext: no clubbing or cyanosis, very mild LUE edema but overall improving Psych: pleasant and cooperative Skin:clean and dry  PRIOR EXAMS: Neuro:     He is awake and alert.   Oriented to month, day, hospital, self.  Mildly slow to process. + decreased memory.  +mild confusion,  Slightly dysarthric speech.  Decreased insight and awareness.  Language overall appears to be intact.  Left central 7.  Decreased sensation to light touch in left upper extremity and left lower extremity.  Moving all 4 extremities to gravity and resistance Mildly increased tone R hand present   -overall stable 2/28   Assessment/Plan: 1. Functional deficits which require 3+ hours per day of interdisciplinary therapy in a comprehensive inpatient rehab setting. Physiatrist is providing close team supervision and 24 hour management of active medical problems listed below. Physiatrist and rehab team continue to assess barriers to discharge/monitor patient progress toward functional and medical goals  Care Tool:  Bathing    Body parts bathed by patient: Left arm, Chest, Abdomen, Front perineal area, Right upper leg, Left upper leg, Right lower leg, Face   Body parts bathed by helper: Right arm, Buttocks, Left lower leg     Bathing assist Assist Level: Moderate Assistance - Patient 50 - 74%     Upper Body Dressing/Undressing Upper body dressing        Upper body assist Assist Level: Moderate Assistance - Patient 50 - 74%    Lower Body Dressing/Undressing Lower body dressing            Lower body assist Assist for lower body dressing: Maximal Assistance - Patient 25 - 49%     Toileting Toileting    Toileting assist Assist for toileting: Maximal Assistance - Patient 25 - 49%  Transfers Chair/bed transfer  Transfers assist     Chair/bed transfer assist level: Moderate Assistance - Patient 50 - 74% (squat pivot/stand pivot)     Locomotion Ambulation   Ambulation assist   Ambulation activity did not occur: Safety/medical concerns  Assist level: 2 helpers (+2 for WC follow) Assistive device: Walker-rolling Max distance: 50 feet   Walk 10 feet activity   Assist  Walk 10 feet activity did not occur: Safety/medical concerns  Assist level: 2 helpers Assistive device: Walker-rolling   Walk 50 feet activity   Assist Walk 50 feet with 2 turns activity did not occur: Safety/medical concerns  Assist level: 2 helpers Assistive device: Walker-rolling    Walk 150 feet activity   Assist Walk 150 feet activity did not occur: Safety/medical  concerns         Walk 10 feet on uneven surface  activity   Assist Walk 10 feet on uneven surfaces activity did not occur: Safety/medical concerns         Wheelchair     Assist Is the patient using a wheelchair?: Yes Type of Wheelchair: Manual    Wheelchair assist level: Minimal Assistance - Patient > 75% Max wheelchair distance: 50    Wheelchair 50 feet with 2 turns activity    Assist        Assist Level: Minimal Assistance - Patient > 75%   Wheelchair 150 feet activity     Assist      Assist Level: Maximal Assistance - Patient 25 - 49%   Blood pressure (!) 161/88, pulse 68, temperature (!) 97.3 F (36.3 C), resp. rate 17, height 5\' 9"  (1.753 m), weight 89.5 kg, SpO2 95%.  Medical Problem List and Plan: 1. 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  Await plan for loop recorder             -patient may shower -ELOS/Goals: 21-25 days, min assist with PT, OT and supervision with SLP -Continue CIR therapies including PT, OT, and SLP  -Discharge date adjusted to 3/15   2.  Antithrombotics: -DVT/anticoagulation:  Pharmaceutical: eliquis per pharmacy -03/07/23 now on eliquis per pharmacy -antiplatelet therapy: Aspirin 81 mg daily and Plavix 75 mg day x 3 weeks then Plavix alone -2/20 DC aspirin and Plavix as on heparin, discussed with neurology and cardiology-- 2/22 now on eliquis  3. Pain Management: Lidoderm patch as directed, Tylenol as needed 4. Mood/Sleep/sundowning: Effexor 150 mg every evening, provide emotional support             -antipsychotic agents: Seroquel 50 mg nighty at 2030, have back up dose             -keep sleep chart 5. Neuropsych/cognition: This patient is capable of making decisions on his own behalf. 6. Skin/Wound Care: Routine skin checks 7. Fluids/Electrolytes/Nutrition: Routine in and outs with follow-up chemistries, continue  vitamins/supplements 8.  Hypothyroidism.  Synthroid 71mcg/day 9.  Hypertension. Norvasc 10 mg daily, Toprol-Xl 100 mg daily, Aldactone 25 mg daily.             -bp trending up. Likely will need additions to regimen  -02/22/23 BPs 140s/90s, not terrible, monitor trend with increase mobility  2/10 BP controlled, decrease spironolactone due to elevated CR/BUN -02/28/23 BP stable but creeping up, continue to monitor for now but may need to increase meds back -2/25 BP stable, hold losartan for now due to renal function -2/28 BP a little higher, restart  low dose losartan 25mg  daily -03/14/23 BP ok, monitor for effect from recent med adjustment  Vitals:   03/11/23 1926 03/12/23 0310 03/12/23 0326 03/12/23 1322  BP: (!) 163/92 106/74 (!) 143/64 121/87   03/12/23 1915 03/13/23 0424 03/13/23 0839 03/13/23 0840  BP: (!) 142/81 (!) 142/97 (!) 152/92 (!) 152/92   03/13/23 1325 03/13/23 1413 03/13/23 2015 03/14/23 0422  BP: (!) 158/96 (!) 147/99 (!) 153/102 (!) 161/88    10.H/O prostate cancer. Myrbetriq 50 mg daily. 11.Hyperlipidemia. Lipitor 80mg  daily 12.Constipation.             -miralax daily, SenokotS 1 tab daily -02/22/23 LBM 2days ago, monitor today, if no BM tomorrow then may want to increase regimen -2/25 LBM 2/23, consider addtional medication if no bowel movement today -03/14/23 LBM today, continue to monitor 13.H/O gout. Zyloprim 300mg  daily 14.  AKI versus CKD  -Encourage oral fluid intake, decrease spironolactone to 12.5 mg  -2/11 recheck BMP today  Addendum- increased Cr, stop spironolactone, start IVF NS 1ml/HR  -2/12 Cr slightly improved, continue IVF, encourage oral fluids  -2/13 recheck BMP- CR down to 1.2  -2/14 DC IVF, discussed oral hydration  2/17 BUN/CR stable 17/1.25  -2/18 continue to encourage oral hydration -2/20 BUN and creatinine improved to 21/1.25, got IV fluids last night 73ml/hr   2/21 Encourage PO fluid intake  -03/07/23 Cr 0.99 today, poor PO intake, a little dizzy  this morning, will give IVFs @50ml /hr x12hrs now, and have standing PRN order for 70ml/hr x12hrs at bedtime if PO intake <1247ml/day -03/08/23 doing much better today after IVFs; leave standing order on for now, but encouraged pt to drink water throughout the day  2/24---give additional IVF today as Cr sl up 2/25 hold losartan, will hold off on IVF for now and encourage oral hydration.  Caution with IVF as x-ray chest with increased pulmonary congestion 2/27 50 mL/h IVF, gentle rate due to CHF history, started yesterday, continue today, creatinine slightly improved 2/28 Change IVF NS with potassium  to at night only, Cr and Bun improved. Can hold if drinking > fluids that day -03/14/23 doing well, monitor, hopefully can avoid using IVFs tonight 15.  History of CHF.  Last EF 55%, mild diastolic dysfunction  -Continue metoprolol, decrease spironolactone to 12.5 mg as above -2/14 Wts stable, has not appear to show signs of fluid overload, continue to monitor -02/28/23 wt up but doubt accuracy, monitor, no overload on exam -2/19-20 weight stable, does not appear to show signs of fluid overload, EF 40 to 45% on echo. -Cardiology has been following, they discontinued amlodipine and started losartan 25 mg daily-beginning tomorrow-- signing off 2/22 (see recs below)  -Follow-up with cardiology at the [facility] postdischarge  -2/27 weight is overall stable, continue to monitor  -2/28 restart losartan  -03/14/23 wt up a little but clinically not overloaded, monitor Filed Weights   03/12/23 0319 03/13/23 0600 03/14/23 0424  Weight: 88.8 kg 88.8 kg 89.5 kg    16.  Severe cervical stenosis: Consider neurosurgery outpatient follow-up 17. Insomnia -Patient has Seroquel 50 mg scheduled and 25 mg as needed.  Will also add in 3 mg melatonin at bedtime  -2/11 increase Seroquel to 75 mg nightly  -2/13 asked nursing to complete sleep-wake chart  -2/14 reports sleeping better, continue current regimen for now and  monitor  -2/20 will decrease Seroquel back to 50 mg nightly and 25 mg at bedtime  2/21 Decrease seroquel to 25mg  nightly and 25mg  PRN  2/25 increase Seroquel scheduled back to 25 nightly and 25 as needed  -2/28-3/1 Pt reports sleeping better, continue current  18.  Bleeding from his penis  -UA neg,HGB was stable on 2/11 -Denies additional occurrences.  During team meeting was noted this happened intermittently during acute hospital stay  19. Nasal congestion: Flonase 1 spray daily  20. Staring episodes/vision changes 03/01/23 -03/01/23 having staring episodes and ?chronic vision changes/hemianopsia? Spoke with Dr. Derry Lory of neuro, will get 24hr EEG and head CT, no code stroke at this point; they will see pt, appreciate their assistance.  -2/17 overnight EEG neg, CT head without acute changes  21. Chest pain  -EKG, Trop, CXR ordered -Cardiology following appreciate assistance.  D-dimer positive, getting CT PE study to rule out PE.  If positive will need to discuss with neurology regarding anticoagulation.  He is now on 2 L nasal cannula oxygen  -2/20 patient reports his chest pain is resolved  22. Pulmonary embolism / DVT left popliteal vein -2/19 discussed with neurology and cardiology. Heparin grip started, wife updated by phone. Critical care medicine consulted 2/20 seen by critical care, continue heparin eventual transition to DOAC, if hemodynamic changes contact PCCM again. Advised 24h bedrest with [facility] 2/21 plan to transition to DOAC later today, discussed with pharmacy by phone  -03/07/23 cardiology saw pt, recommend eliquis per protolocol, continue toprol/low dose losartan, and they signed off-- f/up in 3-4wks with Dr. Odis Hollingshead  23. Leukocytosis: 13k today -SLP felt that he was having more difficulties from a cognitive standpoint today  -he is afebrile. No cough or respiratory sx  -will check UA, UCX- UA does not indicate infection  -recheck CBC in AM  -CXR yesterday with pulmonary  vascular congestion  -WBC down to 7.5 2/25  24. Mild hyponatremia, mild continue to monitor   -2/28 improved today to 135   LOS: 21 days A FACE TO FACE EVALUATION WAS PERFORMED  [id number] Valaria Kohut 03/14/2023, 10:36 AM

## 2023-03-15 NOTE — Progress Notes (Signed)
 Physical Therapy Session Note  Patient Details  Name: Edward Rodgers MRN: [id number] Date of Birth: 31-Dec-1950  Today's Date: 03/15/2023 PT Individual Time: 1430-1530 PT Individual Time Calculation (min): 60 min   Short Term Goals: Week 3:  PT Short Term Goal 1 (Week 3): Pt will perform WC consult for TIS WC PT Short Term Goal 2 (Week 3): therapist will initiate hands on family training PT Short Term Goal 3 (Week 3): Pt will perform bed to chair transfer with mod A or less consistently PT Short Term Goal 4 (Week 3): pt will perform sit to stand with mod A or less consistently  Skilled Therapeutic Interventions/Progress Updates:      Pt seated in TIS upon arrival. Pt agreeable to therapy. Pt denies any pain.   Pt transported dependnet in TIS to ortho gym. Pt performed stand pivot transfer x3 to car simulator with min A, with max verbal cues provided for technique, pt required min A for management of L LE into car.   Pt performed the following exercises for L LE strength/ROM/independence with mobility:   1x10 L LE LAQ  1x10 L LE heel toe raises  1x10 L LE seated marching  Pt performed stand pivot transfer TIS WC to mat table with min-mod A. Therapist opted to trial standard WC 2/2 improved alertness and attention throughout the day over the last few days. Pt self propelled WC x150 feet with supervision with R UE/LE, therapist doffed L leg rest to assess activation of L LE. Pt required HOH assist and max verbal cues for technqiue for L LE initiation and activation.   Discussed with OT and nurse, plan to allow pt to sit up in chair with seatbelt alarm on, door open and frequent check ins from nurse/tech.   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 Restrictions Weight Bearing Restrictions Per Provider Order: No  Therapy/Group: Individual Therapy  [facility] Ambrose Finland, [place], DPT  03/15/2023, 4:16 PM

## 2023-03-15 NOTE — Progress Notes (Signed)
 Occupational Therapy Weekly Progress Note  Patient Details  Name: SHAWNDALE KILPATRICK MRN: [id number] Date of Birth: 02/06/1950  Beginning of progress report period: {Time; dates multiple:304500300} End of progress report period: {Time; dates multiple:304500300}  {CHL IP REHAB OT TIME CALCULATIONS:304400400}   Patient has met {number 1-5:22450} of {number 1-5:20334} short term goals.  ***  Patient continues to demonstrate the following deficits: {impairments:3041632} and therefore will continue to benefit from skilled OT intervention to enhance overall performance with {ADL/iADL:3041649}.  Patient {LTG progression:3041653}.  {plan of FAOZ:3086578}  OT Short Term Goals {OT ION:6295284}  Skilled Therapeutic Interventions/Progress Updates:  Pt greeted *** for skilled OT session with focus on ***.   Pain: Pt reported ***/10 pain, stating "***" in reference to ***. OT offering intermediate rest breaks and positioning suggestions throughout session to address pain/fatigue and maximize participation/safety in session.   Functional Transfers:  Self Care Tasks: Pt completes the following self care tasks with levels of assistance noted below, UB: LB:   Therapeutic Activities:  Therapeutic Exercise:   Education:  Pt remained *** with 4Ps assessed and immediate needs met. Pt continues to be appropriate for skilled OT intervention to promote further functional independence in ADLs/IADLs.    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 Restrictions Weight Bearing Restrictions Per Provider Order: No  Therapy/Group: Individual Therapy  Lou Cal, OTR/L, MSOT  03/15/2023, 7:59 AM

## 2023-03-15 NOTE — Plan of Care (Signed)
  Problem: Consults Goal: RH STROKE PATIENT EDUCATION Description: See Patient Education module for education specifics  Outcome: Progressing   Problem: RH BOWEL ELIMINATION Goal: RH STG MANAGE BOWEL WITH ASSISTANCE Description: STG Manage Bowel with min Assistance. Outcome: Progressing Goal: RH STG MANAGE BOWEL W/MEDICATION W/ASSISTANCE Description: STG Manage Bowel with Medication with mod I  Assistance. Outcome: Progressing   Problem: RH BLADDER ELIMINATION Goal: RH STG MANAGE BLADDER WITH ASSISTANCE Description: STG Manage Bladder With min Assistance Outcome: Progressing Goal: RH STG MANAGE BLADDER WITH MEDICATION WITH ASSISTANCE Description: STG Manage Bladder With Medication With mod I Assistance. Outcome: Progressing   Problem: RH SKIN INTEGRITY Goal: RH STG SKIN FREE OF INFECTION/BREAKDOWN Description: Manage skin w min assist Outcome: Progressing Goal: RH STG MAINTAIN SKIN INTEGRITY WITH ASSISTANCE Description: STG Maintain Skin Integrity With min Assistance. Outcome: Progressing   Problem: RH KNOWLEDGE DEFICIT Goal: RH STG INCREASE KNOWLEDGE OF DIABETES Description: Patient and wife will be able to manage prediabetes using educational resources for medications and dietary modification independently Outcome: Progressing Goal: RH STG INCREASE KNOWLEDGE OF HYPERTENSION Description: Patient and wife will be able to manage HTN using educational resources for medications and dietary modification independently Outcome: Progressing Goal: RH STG INCREASE KNOWLEGDE OF HYPERLIPIDEMIA Description: Patient and wife will be able to manage HLD using educational resources for medications and dietary modification independently Outcome: Progressing Goal: 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

## 2023-03-15 NOTE — Progress Notes (Signed)
 PROGRESS NOTE   Subjective/Complaints:  Pt doing well again, finishing breakfast. Slept well. Denies pain. LBM just now. Urinating fine. Denies any other complaints or concerns.   ROS: Patient denies fever,  vision changes,  dizziness, nausea, vomiting, diarrhea,  shortness of breath or chest pain, headache, or mood change. +poor appetite--improving Insomnia- improved   Objective:   No results found.   No results for input(s): "WBC", "HGB", "HCT", "PLT" in the last 72 hours.   Recent Labs    03/13/23 0645  NA 135  K 3.5  CL 106  CO2 22  GLUCOSE 91  BUN 19  CREATININE 1.21  CALCIUM 8.9        Intake/Output Summary (Last 24 hours) at 03/15/2023 1024 Last data filed at 03/15/2023 0125 Gross per 24 hour  Intake 318 ml  Output 500 ml  Net -182 ml         Physical Exam: Vital Signs Blood pressure (!) 156/99, pulse 73, temperature 98.3 F (36.8 C), temperature source Oral, resp. rate 17, height 5\' 9"  (1.753 m), weight 88.9 kg, SpO2 95%.  Constitutional: No distress . Vital signs reviewed.  Sitting up in bed, appears comfortable, finishing breakfast HEENT: NCAT, EOMI, oral membranes moist Neck: supple Cardiovascular: RRR Respiratory/Chest: CTA Bilaterally, on room air   GI/Abdomen: BS +, non-tender, non-distended Ext: no clubbing or cyanosis, very mild LUE edema but overall improving Psych: pleasant and cooperative Skin:clean and dry  PRIOR EXAMS: Neuro:     He is awake and alert.   Oriented to month, day, hospital, self.  Mildly slow to process. + decreased memory.  +mild confusion,  Slightly dysarthric speech.  Decreased insight and awareness.  Language overall appears to be intact.  Left central 7.  Decreased sensation to light touch in left upper extremity and left lower extremity.  Moving all 4 extremities to gravity and resistance Mildly increased tone R hand present  -overall stable  2/28   Assessment/Plan: 1. Functional deficits which require 3+ hours per day of interdisciplinary therapy in a comprehensive inpatient rehab setting. Physiatrist is providing close team supervision and 24 hour management of active medical problems listed below. Physiatrist and rehab team continue to assess barriers to discharge/monitor patient progress toward functional and medical goals  Care Tool:  Bathing    Body parts bathed by patient: Left arm, Chest, Abdomen, Front perineal area, Right upper leg, Left upper leg, Right lower leg, Face   Body parts bathed by helper: Right arm, Buttocks, Left lower leg     Bathing assist Assist Level: Moderate Assistance - Patient 50 - 74%     Upper Body Dressing/Undressing Upper body dressing        Upper body assist Assist Level: Moderate Assistance - Patient 50 - 74%    Lower Body Dressing/Undressing Lower body dressing            Lower body assist Assist for lower body dressing: Maximal Assistance - Patient 25 - 49%     Toileting Toileting    Toileting assist Assist for toileting: Maximal Assistance - Patient 25 - 49%     Transfers Chair/bed transfer  Transfers assist     Chair/bed  transfer assist level: Minimal Assistance - Patient > 75%     Locomotion Ambulation   Ambulation assist   Ambulation activity did not occur: Safety/medical concerns  Assist level: 2 helpers (+2 for WC follow) Assistive device: Walker-rolling Max distance: 50 feet   Walk 10 feet activity   Assist  Walk 10 feet activity did not occur: Safety/medical concerns  Assist level: 2 helpers Assistive device: Walker-rolling   Walk 50 feet activity   Assist Walk 50 feet with 2 turns activity did not occur: Safety/medical concerns  Assist level: 2 helpers Assistive device: Walker-rolling    Walk 150 feet activity   Assist Walk 150 feet activity did not occur: Safety/medical concerns         Walk 10 feet on uneven surface   activity   Assist Walk 10 feet on uneven surfaces activity did not occur: Safety/medical concerns         Wheelchair     Assist Is the patient using a wheelchair?: Yes Type of Wheelchair: Manual    Wheelchair assist level: Minimal Assistance - Patient > 75% Max wheelchair distance: 50    Wheelchair 50 feet with 2 turns activity    Assist        Assist Level: Minimal Assistance - Patient > 75%   Wheelchair 150 feet activity     Assist      Assist Level: Maximal Assistance - Patient 25 - 49%   Blood pressure (!) 156/99, pulse 73, temperature 98.3 F (36.8 C), temperature source Oral, resp. rate 17, height 5\' 9"  (1.753 m), weight 88.9 kg, SpO2 95%.  Medical Problem List and Plan: 1. 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  Await plan for loop recorder             -patient may shower -ELOS/Goals: 21-25 days, min assist with PT, OT and supervision with SLP -Continue CIR therapies including PT, OT, and SLP  -Discharge date adjusted to 3/15   2.  Antithrombotics: -DVT/anticoagulation:  Pharmaceutical: eliquis per pharmacy -03/07/23 now on eliquis per pharmacy -antiplatelet therapy: Aspirin 81 mg daily and Plavix 75 mg day x 3 weeks then Plavix alone -2/20 DC aspirin and Plavix as on heparin, discussed with neurology and cardiology-- 2/22 now on eliquis  3. Pain Management: Lidoderm patch as directed, Tylenol as needed 4. Mood/Sleep/sundowning: Effexor 150 mg every evening, provide emotional support             -antipsychotic agents: Seroquel 50 mg nighty at 2030, have back up dose             -keep sleep chart 5. Neuropsych/cognition: This patient is capable of making decisions on his own behalf. 6. Skin/Wound Care: Routine skin checks 7. Fluids/Electrolytes/Nutrition: Routine in and outs with follow-up chemistries, continue vitamins/supplements 8.  Hypothyroidism.   Synthroid 10mcg/day 9.  Hypertension. Norvasc 10 mg daily, Toprol-Xl 100 mg daily, Aldactone 25 mg daily.             -bp trending up. Likely will need additions to regimen  -02/22/23 BPs 140s/90s, not terrible, monitor trend with increase mobility  2/10 BP controlled, decrease spironolactone due to elevated CR/BUN -02/28/23 BP stable but creeping up, continue to monitor for now but may need to increase meds back -2/25 BP stable, hold losartan for now due to renal function -2/28 BP a little higher, restart low dose losartan 25mg  daily -3/1-2/25 BP ok, monitor for effect from recent  med adjustment  Vitals:   03/12/23 1915 03/13/23 0424 03/13/23 0839 03/13/23 0840  BP: (!) 142/81 (!) 142/97 (!) 152/92 (!) 152/92   03/13/23 1325 03/13/23 1413 03/13/23 2015 03/14/23 0422  BP: (!) 158/96 (!) 147/99 (!) 153/102 (!) 161/88   03/14/23 1327 03/14/23 2100 03/15/23 0455 03/15/23 0800  BP: (!) 145/95 138/79 (!) 156/98 (!) 156/99    10.H/O prostate cancer. Myrbetriq 50 mg daily. 11.Hyperlipidemia. Lipitor 80mg  daily 12.Constipation.             -miralax daily, SenokotS 1 tab daily -02/22/23 LBM 2days ago, monitor today, if no BM tomorrow then may want to increase regimen -2/25 LBM 2/23, consider addtional medication if no bowel movement today -03/15/23 LBM today, continue to monitor 13.H/O gout. Zyloprim 300mg  daily 14.  AKI versus CKD  -Encourage oral fluid intake, decrease spironolactone to 12.5 mg  -2/11 recheck BMP today  Addendum- increased Cr, stop spironolactone, start IVF NS 59ml/HR  -2/12 Cr slightly improved, continue IVF, encourage oral fluids  -2/13 recheck BMP- CR down to 1.2  -2/14 DC IVF, discussed oral hydration  2/17 BUN/CR stable 17/1.25  -2/18 continue to encourage oral hydration -2/20 BUN and creatinine improved to 21/1.25, got IV fluids last night 28ml/hr   2/21 Encourage PO fluid intake  -03/07/23 Cr 0.99 today, poor PO intake, a little dizzy this morning, will give IVFs  @50ml /hr x12hrs now, and have standing PRN order for 35ml/hr x12hrs at bedtime if PO intake <1222ml/day -03/08/23 doing much better today after IVFs; leave standing order on for now, but encouraged pt to drink water throughout the day  2/24---give additional IVF today as Cr sl up 2/25 hold losartan, will hold off on IVF for now and encourage oral hydration.  Caution with IVF as x-ray chest with increased pulmonary congestion 2/27 50 mL/h IVF, gentle rate due to CHF history, started yesterday, continue today, creatinine slightly improved 2/28 Change IVF NS with potassium  to at night only, Cr and Bun improved. Can hold if drinking > fluids that day -03/14/23 doing well, monitor, hopefully can avoid using IVFs tonight  15.  History of CHF.  Last EF 55%, mild diastolic dysfunction  -Continue metoprolol, decrease spironolactone to 12.5 mg as above -2/14 Wts stable, has not appear to show signs of fluid overload, continue to monitor -02/28/23 wt up but doubt accuracy, monitor, no overload on exam -2/19-20 weight stable, does not appear to show signs of fluid overload, EF 40 to 45% on echo. -Cardiology has been following, they discontinued amlodipine and started losartan 25 mg daily-beginning tomorrow-- signing off 2/22 (see recs below)  -Follow-up with cardiology at [facility] postdischarge  -2/27 weight is overall stable, continue to monitor  -2/28 restart losartan  -03/15/23 wt stable Filed Weights   03/13/23 0600 03/14/23 0424 03/15/23 0509  Weight: 88.8 kg 89.5 kg 88.9 kg    16.  Severe cervical stenosis: Consider neurosurgery outpatient follow-up 17. Insomnia -Patient has Seroquel 50 mg scheduled and 25 mg as needed.  Will also add in 3 mg melatonin at bedtime  -2/11 increase Seroquel to 75 mg nightly  -2/13 asked nursing to complete sleep-wake chart  -2/14 reports sleeping better, continue current regimen for now and monitor  -2/20 will decrease Seroquel back to 50 mg nightly and 25 mg at  bedtime  2/21 Decrease seroquel to 25mg  nightly and 25mg  PRN  2/25 increase Seroquel scheduled back to 25 nightly and 25 as needed  -2/28-3/2 Pt reports sleeping better,  continue current  18.  Bleeding from his penis  -UA neg,HGB was stable on 2/11 -Denies additional occurrences.  During team meeting was noted this happened intermittently during acute hospital stay  19. Nasal congestion: Flonase 1 spray daily  20. Staring episodes/vision changes 03/01/23 -03/01/23 having staring episodes and ?chronic vision changes/hemianopsia? Spoke with Dr. Derry Lory of neuro, will get 24hr EEG and head CT, no code stroke at this point; they will see pt, appreciate their assistance.  -2/17 overnight EEG neg, CT head without acute changes  21. Chest pain  -EKG, Trop, CXR ordered -Cardiology following appreciate assistance.  D-dimer positive, getting CT PE study to rule out PE.  If positive will need to discuss with neurology regarding anticoagulation.  He is now on 2 L nasal cannula oxygen  -2/20 patient reports his chest pain is resolved  22. Pulmonary embolism / DVT left popliteal vein -2/19 discussed with neurology and cardiology. Heparin grip started, wife updated by phone. Critical care medicine consulted 2/20 seen by critical care, continue heparin eventual transition to DOAC, if hemodynamic changes contact PCCM again. Advised 24h bedrest with [facility] 2/21 plan to transition to DOAC later today, discussed with pharmacy by phone  -03/07/23 cardiology saw pt, recommend eliquis per protolocol, continue toprol/low dose losartan, and they signed off-- f/up in 3-4wks with Dr. Odis Hollingshead  23. Leukocytosis: 13k today -SLP felt that he was having more difficulties from a cognitive standpoint today  -he is afebrile. No cough or respiratory sx  -will check UA, UCX- UA does not indicate infection  -recheck CBC in AM  -CXR yesterday with pulmonary vascular congestion  -WBC down to 7.5 2/25  24. Mild hyponatremia,  mild continue to monitor   -2/28 improved today to 135   LOS: 22 days A FACE TO [facility] EVALUATION WAS PERFORMED  [address] 03/15/2023, 10:24 AM

## 2023-03-16 LAB — BASIC METABOLIC PANEL
Anion gap: 8 (ref 5–15)
BUN: 8 mg/dL (ref 8–23)
CO2: 22 mmol/L (ref 22–32)
Calcium: 9.2 mg/dL (ref 8.9–10.3)
Chloride: 106 mmol/L (ref 98–111)
Creatinine, Ser: 1.08 mg/dL (ref 0.61–1.24)
GFR, Estimated: >60 mL/min (ref >60)
Glucose, Bld: 102 mg/dL — ABNORMAL HIGH (ref 70–99)
Potassium: 3.5 mmol/L (ref 3.5–5.1)
Sodium: 136 mmol/L (ref 135–145)

## 2023-03-16 LAB — CBC
HCT: 35.8 % — ABNORMAL LOW (ref 39.0–52.0)
Hemoglobin: 11.5 g/dL — ABNORMAL LOW (ref 13.0–17.0)
MCH: 27.9 pg (ref 26.0–34.0)
MCHC: 32.1 g/dL (ref 30.0–36.0)
MCV: 86.9 fL (ref 80.0–100.0)
Platelets: 255 10*3/uL (ref 150–400)
RBC: 4.12 MIL/uL — ABNORMAL LOW (ref 4.22–5.81)
RDW: 14.7 % (ref 11.5–15.5)
WBC: 6.2 10*3/uL (ref 4.0–10.5)
nRBC: 0 % (ref 0.0–0.2)

## 2023-03-16 MED ORDER — LOSARTAN POTASSIUM 50 MG PO TABS
50.0000 mg | ORAL_TABLET | Freq: Every day | ORAL | Status: DC
  Administered 2023-03-17 – 2023-03-19 (×3): 50 mg via ORAL
  Filled 2023-03-16 (×3): qty 1

## 2023-03-16 NOTE — Progress Notes (Signed)
 Speech Language Pathology Weekly Progress and Session Note  Patient Details  Name: Edward Rodgers MRN: [id number] Date of Birth: 1950-07-19  Beginning of progress report period: March 10, 2023 End of progress report period: March 16, 2023  Today's Date: 03/16/2023 SLP Individual Time: 1100-1200 SLP Individual Time Calculation (min): 60 min  Short Term Goals: Week 3: SLP Short Term Goal 1 (Week 3): STGs=LTGs d/t ELOS SLP Short Term Goal 1 - Progress (Week 3): Progressing toward goal     New Short Term Goals: Week 4: SLP Short Term Goal 1 (Week 4): Pt will complete functional problem solving tasks @ 90% accuracy given modA SLP Short Term Goal 2 (Week 4): Pt will utilize external memory aids as needed to recall recent/relevant information w/ 80% accuracy given modA visual/verbal cues SLP Short Term Goal 3 (Week 4): When prompted, pt will demonstrate awareness of errors in 3/5 attempts with modA  Weekly Progress Updates: Pt demonstrates good progress towards LTGs at this time, as evidenced by improving functional problem solving, attention, and STM. Slight improvements noted with overall awareness of deficits, though pt continues to require prompts to identify errors. Pt/family education ongoing at this time. He would benefit from continued ST to target remaining moderate cognitive deficits, facilitate reduced caregiver burden, and maximize pt independence.    Intensity: Minumum of 1-2 x/day, 30 to 90 minutes Frequency: 3 to 5 out of 7 days Duration/Length of Stay: 3/15 Treatment/Interventions: Cognitive remediation/compensation;Environmental controls;Cueing hierarchy;Functional tasks;Therapeutic Activities;Internal/external aids;Patient/family education   Daily Session  Skilled Therapeutic Interventions: Pt and his brother were greeted in his room. He was awake/alert upon SLP arrival and sitting up in his wheelchair. He was very pleasant and cooperative throughout tx tasks targeting  cognition. He was able to independently recall vague details re daily events w/ 50% accuracy, increasing to 75% accuracy with modA verbal cues. SLP facilitated time calculation task. He initially required maxA cues for working memory, calculations, and information processing. However, once provided w/ visual of clock to assist with processing/memory, he required only modA cues for calculations themselves. Once prompted to identify errors, he was able to demonstrate adequate intellectual awareness of processing deficits w/ modA cues. He also completed a calendar task targeting visual organization and processing with only minA visual/verbal cues for attention to detail. Noted to require additional processing time throughout tasks. At the end of tx tasks, he was left in his chair with the alarm set and call light within reach. Additionally, pt's brother and cousin present upon SLP departure. Recommend cont ST per POC.       Pain Pain Assessment Pain Scale: 0-10 Pain Score: 0-No pain  Therapy/Group: Individual Therapy  [name] 03/16/2023, 11:36 AM

## 2023-03-16 NOTE — Progress Notes (Signed)
 Physical Therapy Weekly Progress Note  Patient Details  Name: Edward Rodgers MRN: [id number] Date of Birth: 1950/03/18  Beginning of progress report period: March 09, 2023 End of progress report period: March 16, 2023  Today's Date: 03/16/2023 PT Individual Time: 1402-1500 PT Individual Time Calculation (min): 58 min   Patient has met 3 of 4 short term goals.  Pt LOS extended from original D/C date of 3/1 to accommodate medical set backs. Pt has made great progress towards goals this week as his overall alertness and attention has improved, however his level of assist continues to fluctuate with fatigue. Pt performing bed mobility with sup/min A, sit to stand with min-mod A , stand/squat pivot transfer with min-mod A, and max A intermittent with fatigue, WC mobility with hemi technique and supervision and increased time , gait for neuromuscular reeducation with mod-max A. WC consult scheduled for 3/4 with Nu motion for custom manual WC.  Plan to continue caregiver education throughout remainder of stay.   Patient continues to demonstrate the following deficits muscle weakness, decreased cardiorespiratoy endurance, decreased coordination and decreased motor planning, decreased midline orientation, decreased attention to left, left side neglect, and decreased motor planning, decreased attention, decreased awareness, decreased problem solving, decreased safety awareness, decreased memory, and delayed processing, and decreased sitting balance, decreased standing balance, hemiplegia, and decreased balance strategies and therefore will continue to benefit from skilled PT intervention to increase functional independence with mobility.  Patient progressing toward long term goals..  Continue plan of care.  PT Short Term Goals Week 3:  PT Short Term Goal 1 (Week 3): Pt will perform WC consult for TIS WC PT Short Term Goal 1 - Progress (Week 3): Met PT Short Term Goal 2 (Week 3): therapist will initiate  hands on family training PT Short Term Goal 2 - Progress (Week 3): Met PT Short Term Goal 3 (Week 3): Pt will perform bed to chair transfer with mod A or less consistently PT Short Term Goal 3 - Progress (Week 3): Progressing toward goal PT Short Term Goal 4 (Week 3): pt will perform sit to stand with mod A or less consistently PT Short Term Goal 4 - Progress (Week 3): Met Week 4:  PT Short Term Goal 1 (Week 4): pt will perform bed mobility with CGA or less consistently PT Short Term Goal 2 (Week 4): pt will perform stand pivot transfer consistently with mod A or less PT Short Term Goal 3 (Week 4): pt will ambulate with LRAD for neuromuscular reeducation  Skilled Therapeutic Interventions/Progress Updates:      Pt seated in TIS WC upon arrival. Pt agreeable to therapy. Pt denies any pain.   Pt reports being sleepy. Pt appears more fatigued today in comparison to previous sessions.   Pt self propelled WC x150 feet with R UE/R LE, and supervision, and increased time, verbal cues provided for techique/energy conservation, and obstacle negotiation.   Pt performing squat pivot transfers WC to mat table with mod-max A today 2/2 fatigue, with max instructional cues provided for technique.   Pt performing sit to stand with RW and L UE splint on RW with mod A, with max questioning cues for sequencing, and heavy tactile cues for anterior weight shift.   Pt performed lateral scooting EOM x~10 feet in each direction with sup/min A, with mod verbal cues provided for technqiue. Pt demos improved dynamic seated balance with no evidence of posterior LOB during activity.   Supine to sit with supervision, verbal cues  provided for sequencing, rolling to L with use of bed rail and supervision, rolling to R with min A, verbal cues provided for attention to L LE.   Pt performed the following therex for improved L LE strength/ROM/attention to L LE:   1x10 supine glute bridge, with therpaist assisting pt into  full L LE knee flexion and stabilizing foot in this position to facilitate imrpoved L LE activation  1x10 supine knee fall outs, with same set up as described above  1x10 SLR, verbal cues provided for tecnique-pt able to obtain full ROM with increased concentration   1x10 supine hip abduction-with verbal cues provided for technique  1x10 ankle pumps-verbal cues provided for completion within available range  1x10 quad sets-verbal/tactile cues provided for completion within availabel range.   Pt supine in bed at end of session with all needs within reach and ebd alarm on.     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 Restrictions Weight Bearing Restrictions Per Provider Order: No  Therapy/Group: Individual Therapy  [facility] [place], [place], DPT  03/16/2023, 7:47 AM

## 2023-03-16 NOTE — Progress Notes (Signed)
 PROGRESS NOTE   Subjective/Complaints:  Patient reports he is doing well today.  Reports he slept well last night.  LBM today.  Ate most of his breakfast.   ROS: Patient denies fever,  vision changes,  dizziness, nausea, vomiting, diarrhea,  shortness of breath or chest pain, headache, or mood change. +poor appetite--improved Insomnia- improved   Objective:   No results found.   Recent Labs    03/16/23 0617  WBC 6.2  HGB 11.5*  HCT 35.8*  PLT 255     Recent Labs    03/16/23 0617  NA 136  K 3.5  CL 106  CO2 22  GLUCOSE 102*  BUN 8  CREATININE 1.08  CALCIUM 9.2        Intake/Output Summary (Last 24 hours) at 03/16/2023 1201 Last data filed at 03/16/2023 1610 Gross per 24 hour  Intake 598 ml  Output 200 ml  Net 398 ml         Physical Exam: Vital Signs Blood pressure (!) 170/94, pulse 79, temperature 97.6 F (36.4 C), temperature source Oral, resp. rate 18, height 5\' 9"  (1.753 m), weight 88.8 kg, SpO2 98%.  Constitutional: No distress . Vital signs reviewed.  Sitting in bedside chair, appears comfortable HEENT: NCAT, EOMI, oral membranes moist Neck: supple Cardiovascular: RRR Respiratory/Chest: CTA Bilaterally, on room air   GI/Abdomen: BS +, non-tender, non-distended Ext: no clubbing or cyanosis, very mild LUE edema but overall improving Psych: pleasant and cooperative Skin:clean and dry  Neuro:     He is awake and alert.   Oriented to month, day, hospital, self.  Mildly slow to process. + decreased memory.  +mild confusion,  Slightly dysarthric speech.  Decreased insight and awareness.  Language overall appears to be intact.  Left central 7.  Decreased sensation to light touch in left upper extremity and left lower extremity.  Moving all 4 extremities to gravity and resistance Mildly increased tone R hand present  -overall stable 3/3   Assessment/Plan: 1. Functional deficits which require 3+  hours per day of interdisciplinary therapy in a comprehensive inpatient rehab setting. Physiatrist is providing close team supervision and 24 hour management of active medical problems listed below. Physiatrist and rehab team continue to assess barriers to discharge/monitor patient progress toward functional and medical goals  Care Tool:  Bathing    Body parts bathed by patient: Left arm, Chest, Abdomen, Front perineal area, Right upper leg, Left upper leg, Right lower leg, Face   Body parts bathed by helper: Right arm, Buttocks, Left lower leg     Bathing assist Assist Level: Moderate Assistance - Patient 50 - 74%     Upper Body Dressing/Undressing Upper body dressing        Upper body assist Assist Level: Moderate Assistance - Patient 50 - 74%    Lower Body Dressing/Undressing Lower body dressing            Lower body assist Assist for lower body dressing: Maximal Assistance - Patient 25 - 49%     Toileting Toileting    Toileting assist Assist for toileting: Maximal Assistance - Patient 25 - 49%     Transfers Chair/bed transfer  Transfers  assist     Chair/bed transfer assist level: Minimal Assistance - Patient > 75%     Locomotion Ambulation   Ambulation assist   Ambulation activity did not occur: Safety/medical concerns  Assist level: 2 helpers (+2 for WC follow) Assistive device: Walker-rolling Max distance: 50 feet   Walk 10 feet activity   Assist  Walk 10 feet activity did not occur: Safety/medical concerns  Assist level: 2 helpers Assistive device: Walker-rolling   Walk 50 feet activity   Assist Walk 50 feet with 2 turns activity did not occur: Safety/medical concerns  Assist level: 2 helpers Assistive device: Walker-rolling    Walk 150 feet activity   Assist Walk 150 feet activity did not occur: Safety/medical concerns         Walk 10 feet on uneven surface  activity   Assist Walk 10 feet on uneven surfaces activity did  not occur: Safety/medical concerns         Wheelchair     Assist Is the patient using a wheelchair?: Yes Type of Wheelchair: Manual    Wheelchair assist level: Minimal Assistance - Patient > 75% Max wheelchair distance: 50    Wheelchair 50 feet with 2 turns activity    Assist        Assist Level: Minimal Assistance - Patient > 75%   Wheelchair 150 feet activity     Assist      Assist Level: Maximal Assistance - Patient 25 - 49%   Blood pressure (!) 170/94, pulse 79, temperature 97.6 F (36.4 C), temperature source Oral, resp. rate 18, height 5\' 9"  (1.753 m), weight 88.8 kg, SpO2 98%.  Medical Problem List and Plan: 1. 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  Await plan for loop recorder             -patient may shower -ELOS/Goals: 21-25 days, min assist with PT, OT and supervision with SLP -Continue CIR therapies including PT, OT, and SLP  -Discharge date adjusted to 3/15   2.  Antithrombotics: -DVT/anticoagulation:  Pharmaceutical: eliquis per pharmacy -03/07/23 now on eliquis per pharmacy -antiplatelet therapy: Aspirin 81 mg daily and Plavix 75 mg day x 3 weeks then Plavix alone -2/20 DC aspirin and Plavix as on heparin, discussed with neurology and cardiology-- 2/22 now on eliquis  3. Pain Management: Lidoderm patch as directed, Tylenol as needed 4. Mood/Sleep/sundowning: Effexor 150 mg every evening, provide emotional support             -antipsychotic agents: Seroquel 50 mg nighty at 2030, have back up dose             -keep sleep chart 5. Neuropsych/cognition: This patient is capable of making decisions on his own behalf. 6. Skin/Wound Care: Routine skin checks 7. Fluids/Electrolytes/Nutrition: Routine in and outs with follow-up chemistries, continue vitamins/supplements 8.  Hypothyroidism.  Synthroid 65mcg/day 9.  Hypertension. Norvasc 10 mg daily, Toprol-Xl 100 mg  daily, Aldactone 25 mg daily.             -bp trending up. Likely will need additions to regimen  -02/22/23 BPs 140s/90s, not terrible, monitor trend with increase mobility  2/10 BP controlled, decrease spironolactone due to elevated CR/BUN -02/28/23 BP stable but creeping up, continue to monitor for now but may need to increase meds back -2/25 BP stable, hold losartan for now due to renal function -2/28 BP a little higher, restart low dose losartan 25mg  daily -3/3 increase  losartan to 50 mg daily  Vitals:   03/13/23 0840 03/13/23 1325 03/13/23 1413 03/13/23 2015  BP: (!) 152/92 (!) 158/96 (!) 147/99 (!) 153/102   03/14/23 0422 03/14/23 1327 03/14/23 2100 03/15/23 0455  BP: (!) 161/88 (!) 145/95 138/79 (!) 156/98   03/15/23 0800 03/15/23 1300 03/15/23 2104 03/16/23 0441  BP: (!) 156/99 (!) 143/99 (!) 172/105 (!) 170/94    10.H/O prostate cancer. Myrbetriq 50 mg daily. 11.Hyperlipidemia. Lipitor 80mg  daily 12.Constipation.             -miralax daily, SenokotS 1 tab daily -02/22/23 LBM 2days ago, monitor today, if no BM tomorrow then may want to increase regimen -2/25 LBM 2/23, consider addtional medication if no bowel movement today -3/3 LBM today, continue to monitor 13.H/O gout. Zyloprim 300mg  daily 14.  AKI versus CKD  -Encourage oral fluid intake, decrease spironolactone to 12.5 mg  -2/11 recheck BMP today  Addendum- increased Cr, stop spironolactone, start IVF NS 90ml/HR  -2/12 Cr slightly improved, continue IVF, encourage oral fluids  -2/13 recheck BMP- CR down to 1.2  -2/14 DC IVF, discussed oral hydration  2/17 BUN/CR stable 17/1.25  -2/18 continue to encourage oral hydration -2/20 BUN and creatinine improved to 21/1.25, got IV fluids last night 88ml/hr   2/21 Encourage PO fluid intake  -03/07/23 Cr 0.99 today, poor PO intake, a little dizzy this morning, will give IVFs @50ml /hr x12hrs now, and have standing PRN order for 38ml/hr x12hrs at bedtime if PO intake  <1247ml/day -03/08/23 doing much better today after IVFs; leave standing order on for now, but encouraged pt to drink water throughout the day  2/24---give additional IVF today as Cr sl up 2/25 hold losartan, will hold off on IVF for now and encourage oral hydration.  Caution with IVF as x-ray chest with increased pulmonary congestion 2/27 50 mL/h IVF, gentle rate due to CHF history, started yesterday, continue today, creatinine slightly improved 2/28 Change IVF NS with potassium  to at night only, Cr and Bun improved. Can hold if drinking > fluids that day -03/14/23 doing well, monitor, hopefully can avoid using IVFs tonight 3/3 improved Cr/BUN continue to monitor   15.  History of CHF.  Last EF 55%, mild diastolic dysfunction  -Continue metoprolol, decrease spironolactone to 12.5 mg as above -2/14 Wts stable, has not appear to show signs of fluid overload, continue to monitor -02/28/23 wt up but doubt accuracy, monitor, no overload on exam -2/19-20 weight stable, does not appear to show signs of fluid overload, EF 40 to 45% on echo. -Cardiology has been following, they discontinued amlodipine and started losartan 25 mg daily-beginning tomorrow-- signing off 2/22 (see recs below)  -Follow-up with [facility]ology at [facility] postdischarge  -2/27 weight is overall stable, continue to monitor  -2/28 restart losartan  -3/3 Wt stable, continue to follow Filed Weights   03/14/23 0424 03/15/23 0509 03/16/23 0546  Weight: 89.5 kg 88.9 kg 88.8 kg    16.  Severe cervical stenosis: Consider neurosurgery outpatient follow-up 17. Insomnia -Patient has Seroquel 50 mg scheduled and 25 mg as needed.  Will also add in 3 mg melatonin at bedtime  -2/11 increase Seroquel to 75 mg nightly  -2/13 asked nursing to complete sleep-wake chart  -2/14 reports sleeping better, continue current regimen for now and monitor  -2/20 will decrease Seroquel back to 50 mg nightly and 25 mg at bedtime  2/21 Decrease seroquel  to 25mg  nightly and 25mg  PRN  2/25 increase Seroquel scheduled back  to 25 nightly and 25 as needed  -2/28-3/3 Pt reports sleeping better, continue current  18.  Bleeding from his penis  -UA neg,HGB was stable on 2/11 -Denies additional occurrences.  During team meeting was noted this happened intermittently during acute hospital stay  19. Nasal congestion: Flonase 1 spray daily  20. Staring episodes/vision changes 03/01/23 -03/01/23 having staring episodes and ?chronic vision changes/hemianopsia? Spoke with Dr. Derry Lory of neuro, will get 24hr EEG and head CT, no code stroke at this point; they will see pt, appreciate their assistance.  -2/17 overnight EEG neg, CT head without acute changes  21. Chest pain  -EKG, Trop, CXR ordered -Cardiology following appreciate assistance.  D-dimer positive, getting CT PE study to rule out PE.  If positive will need to discuss with neurology regarding anticoagulation.  He is now on 2 L nasal cannula oxygen  -2/20 patient reports his chest pain is resolved  22. Pulmonary embolism / DVT left popliteal vein -2/19 discussed with neurology and cardiology. Heparin grip started, wife updated by phone. Critical care medicine consulted 2/20 seen by critical care, continue heparin eventual transition to DOAC, if hemodynamic changes contact PCCM again. Advised 24h bedrest with [facility] 2/21 plan to transition to DOAC later today, discussed with pharmacy by phone  -03/07/23 cardiology saw pt, recommend eliquis per protolocol, continue toprol/low dose losartan, and they signed off-- f/up in 3-4wks with Dr. Odis Hollingshead  23. Leukocytosis: 13k today -SLP felt that he was having more difficulties from a cognitive standpoint today  -he is afebrile. No cough or respiratory sx  -will check UA, UCX- UA does not indicate infection  -recheck CBC in AM  -CXR yesterday with pulmonary vascular congestion  -WBC WNL 3/3  24. Mild hyponatremia, mild continue to monitor   -3/3 stable  136   LOS: 23 days A FACE TO FACE EVALUATION WAS PERFORMED  Fanny Dance 03/16/2023, 12:01 PM

## 2023-03-16 NOTE — Plan of Care (Signed)
 Reactivated WC mobility goals, as pt demos improved safety awareness for self propelling standard WC. No longer anticipating pt to need TIS WC upon discharge.    Problem: RH Wheelchair Mobility Goal: LTG Patient will propel w/c in controlled environment (PT) Description: LTG: Patient will propel wheelchair in controlled environment, # of feet with assist (PT) 03/16/2023 0958 by Ambrose Finland, PT Flowsheets Taken 03/16/2023 0958 LTG: Pt will propel w/c in controlled environ  assist needed:: Independent with assistive device Taken 02/22/2023 1602 LTG: Propel w/c distance in controlled environment: 150 03/16/2023 0958 by Ambrose Finland, PT Reactivated Goal: LTG Patient will propel w/c in home environment (PT) Description: LTG: Patient will propel wheelchair in home environment, # of feet with assistance (PT). 03/16/2023 0958 by Ambrose Finland, PT Flowsheets Taken 03/16/2023 0958 LTG: Pt will propel w/c in home environ  assist needed:: Supervision/Verbal cueing Taken 02/22/2023 1602 LTG: Propel w/c distance in home environment: 75 feet with LRAD 03/16/2023 0958 by Ambrose Finland, PT Reactivated

## 2023-03-16 NOTE — Progress Notes (Signed)
 Call from nursing regarding elevated BP to 189/104. No headache, CP or AMS. Requested staff to obtain repeat BP measurement with manual cuff, each arm. Awaiting results. BP has trending upward and Losartan dose increased to 50 mg daily starting tomorrow morning per MD note this morning.  Recheck with manual cuff: 150/90. Continue to monitor and consider prn medication with parameters.

## 2023-03-17 DIAGNOSIS — R4189 Other symptoms and signs involving cognitive functions and awareness: Secondary | ICD-10-CM

## 2023-03-17 MED ORDER — AMLODIPINE BESYLATE 2.5 MG PO TABS: 2.5000 mg | ORAL_TABLET | Freq: Every day | ORAL | Status: DC

## 2023-03-17 MED ORDER — AMLODIPINE BESYLATE 2.5 MG PO TABS
2.5000 mg | ORAL_TABLET | Freq: Every day | ORAL | Status: DC
Start: 2023-03-18 — End: 2023-03-18
  Administered 2023-03-18: 2.5 mg via ORAL
  Filled 2023-03-17: qty 1

## 2023-03-17 NOTE — Progress Notes (Signed)
 Physical Therapy Session Note  Patient Details  Name: Edward Rodgers MRN: [id number] Date of Birth: August 28, 1950  Today's Date: 03/17/2023 PT Individual Time: 1130-1202, 1330-1400 PT Individual Time Calculation (min): 32 min, 30 min   PT assisting with WC eval 1300-1330     Short Term Goals: Week 4:  PT Short Term Goal 1 (Week 4): pt will perform bed mobility with CGA or less consistently PT Short Term Goal 2 (Week 4): pt will perform stand pivot transfer consistently with mod A or less PT Short Term Goal 3 (Week 4): pt will ambulate with LRAD for neuromuscular reeducation  Skilled Therapeutic Interventions/Progress Updates:      Treatment session 1   Pt on BSC on toilet with tech upon arrival. Pt agreeable to therapy. Pt denies any pain. Pt brother present during session. PT encouraged brother to participate in pt handling during session-however pt brother observed handling during session, but overall  deferring personal care at home to pt wife. Pt brother asking questions throughout session regarding anticipated level of function at discharge -specifically if pt will be able to wipe himself. Education provided at this time that pt has lateral LOB to L when raising R UE off of RW. Education provided that pt has min A goal for toileting at home (donning/doffing pants, and wiping) for overall pt safety/to reduce fall risk. Therapist encouraged brother to encourage wife to attend sessions to get hands on practice.   Pt brother asking about steady, and reports they are reconsidering investing in the off brand version, but are still debating. Therapist recommended it as he would reduce overall burden of care at home. Pt and pt brother verbalized understanding.   Pt continent of bowel. Pt performed sit to stand with RW from [facility] over toilet with mod A, with max verbal/tactile cues for anterior weight shift. Pt stood with RW and min A for RW management while performing pericare with total A and pt  donning pants with mod A 2/2 L lateral trunk lean.   Pt perfrmed sit to stand with RW and mod progressing to min A with pt verbalizing the steps--scooting forward, UE/LE positioning, and forward lean, with mod instructional cues for anteiror weight shift during power up.   Pt seated in WC at end of session with all needs within reach and seatbelt alarm on.    Treatment Session 2   Session focused on custom [facility] evaluation with [employer] vendor. Collaborative approach taken for appropriate fitting to allow for independence with functional transfers, WC mobility and ADLs participation to highest degree at current patient functioning.   Pt remained with PT after scheduled WC evaluation.   Pt performed sit to stand with no AD x5 with therpaist providing approximation to L UE on L LE. Pt demos improved recall of LE positioning and need to scoot forward, max verbal/tactile cues provided for anterior weight shift.   Pt stood with min-mod A, and performed 2x10 cross body reaching with R UE to tap therapist hand (positioned to L of pt), to facilitate improved weight shift to L LE, verbal cues provided for quad activation and upirhgt posture for correction of L mild lateral trunk lean.   Pt stood with no AD and mod A while stacking cones with R UE x3, L UE x3, and unstacking cones with B UE x3; pt required intemittent min A for L UE, verbal cues provided for attention to L UE.   Pt self propelled WC with B LE x ~75 feet  with supervision and increased time with max verbal cues for attention to L UE and intiaition of L UE.   Pt wife in room at end of session. Notified wife of [facility] consult and informed her the loaner WC will be delievered to the room tomorrow at 2:00. Therapist followed up with ramp installation, pt wife reports they have not purchased one yet, and are considering building one. Therpaist provided additional handout for how to build a ramp, as well as education/information for purchasing ramp  from [employer]. Therapist also provided additional handout for home measurement sheet. Therapist strongly encouraged wife to attend therapy sessions to have better understanding of pt CLOF, and get hands on training/education. Pt wife endorses difficulty sleeping at night as barrier for pt attending family training's.   Pt seated in [facility] with all needs within reach and seatbelt alarm on.    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 Restrictions Weight Bearing Restrictions Per Provider Order: No  Therapy/Group: Individual Therapy  [facility] [place], [place], DPT  03/17/2023, 7:50 AM

## 2023-03-17 NOTE — Progress Notes (Addendum)
 Occupational Therapy Session Note  Patient Details  Name: Edward Rodgers MRN: [id number] Date of Birth: 10-26-1950  Today's Date: 03/17/2023 OT Individual Time: 7253-6644 OT Individual Time Calculation (min): 75 min   Today's Date: 03/17/2023 OT Individual Time: 1258-1330 OT Individual Time Calculation (min): 32 min   Short Term Goals: Week 4:  OT Short Term Goal 1 (Week 4): Pt will perform BSC transfer with consitent Min A + LRAD. OT Short Term Goal 2 (Week 4): Pt will perform LB dressing with consistent Mod A + LRAD. OT Short Term Goal 3 (Week 4): Pt will perform 2/3 toileting activities with Min A + LRAD.  Skilled Therapeutic Interventions/Progress Updates:  Session 1: Pt greeted resting in bed, VSS at start of session, skilled OT session with focus on BADL retraining.   Pain: Pt with un-rated buttock discomfort resting in bed, OT offering intermediate rest breaks and positioning suggestions throughout session to address pain/fatigue and maximize participation/safety in session.   Functional Transfers: Stedy used for functional transfers to manage time constraints/energy conservation.    Self Care Tasks: Pt completes the following self care tasks with levels of assistance noted below, UB: Pt doffs long-sleeve top with supervision, using overhead technique. Donning of long-sleeve garment with re-education on hemi-dressing technique and light Min A for entanglement of material. Pt able to bathe UB with supervision, however Mod A (HOH) provided for integration of LUE.  LB: Sitting on padded 3-in-1, pt able to bathe periarea (Min A for thoroughness), overall Min A for thoroughness of LLE. Pt requires Min A for threading of BLE, standing hike with Min-Mod A for balance with use of RUE to manage garments. Max A for sock management.    Pt remained sitting in WC with 4Ps assessed and immediate needs met. Pt continues to be appropriate for skilled OT intervention to promote further functional  independence in ADLs/IADLs.   Session 2: Session focused on custom [facility] evaluation with [employer] vendor. Collaborative approach taken for appropriate fitting to allow for independence with functional transfers and ADLs participation to highest degree at current patient functioning. Pt remained with PT at end scheduled OT time. Pt continues to be appropriate for skilled OT intervention to promote further functional independence.  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 Restrictions Weight Bearing Restrictions Per Provider Order: No   Therapy/Group: Individual Therapy  Lou Cal, OTR/L, MSOT  03/17/2023, 6:09 AM

## 2023-03-17 NOTE — Progress Notes (Signed)
 Speech Language Pathology Daily Session Note  Patient Details  Name: Edward Rodgers MRN: [id number] Date of Birth: [birth year]  Today's Date: 03/17/2023 SLP Individual Time: 1914-7829 SLP Individual Time Calculation (min): 38 min  Short Term Goals: Week 4: SLP Short Term Goal 1 (Week 4): Pt will complete functional problem solving tasks @ 90% accuracy given modA SLP Short Term Goal 2 (Week 4): Pt will utilize external memory aids as needed to recall recent/relevant information w/ 80% accuracy given modA visual/verbal cues SLP Short Term Goal 3 (Week 4): When prompted, pt will demonstrate awareness of errors in 3/5 attempts with modA  Skilled Therapeutic Interventions: SLP conducted skilled therapy session targeting cognitive retraining goals. Patient greeted upright in chair and agreeable to participate. Patient required mod assist to recall daily events and mod assist to set up memory book for the day's schedule. Patient interpreted daily schedule accurately with min assist. During basic deductive reasoning task, patient benefited from min assist to utilize deductive reasoning to solve basic functional problems. Patient was left in chair with call bell in reach and chair alarm set. SLP will continue to target goals per plan of care.       Pain Pain Assessment Pain Scale: 0-10 Pain Score: 0-No pain  Therapy/Group: Individual Therapy  [name], M.A., CCC-SLP  [name] 03/17/2023, 11:55 AM

## 2023-03-17 NOTE — Progress Notes (Addendum)
 PROGRESS NOTE   Subjective/Complaints:  Blood pressure has been more elevated last few days.  Patient reports he feels well overall.  Denies pain.  Reports he is still sleeping well.   ROS: Patient denies fever,  vision changes,  dizziness, nausea, vomiting, diarrhea,  shortness of breath or chest pain, headache, or mood change. +poor appetite--improved + Reports abdominal discomfort a few days ago-resolved today Insomnia- improved   Objective:   No results found.   Recent Labs    03/16/23 0617  WBC 6.2  HGB 11.5*  HCT 35.8*  PLT 255     Recent Labs    03/16/23 0617  NA 136  K 3.5  CL 106  CO2 22  GLUCOSE 102*  BUN 8  CREATININE 1.08  CALCIUM 9.2        Intake/Output Summary (Last 24 hours) at 03/17/2023 1037 Last data filed at 03/16/2023 1823 Gross per 24 hour  Intake 477 ml  Output --  Net 477 ml         Physical Exam: Vital Signs Blood pressure (!) 173/86, pulse 63, temperature 97.7 F (36.5 C), resp. rate 18, height 5\' 9"  (1.753 m), weight 89.5 kg, SpO2 100%.  Constitutional: No distress . Vital signs reviewed.  Sitting in bedside chair HEENT: NCAT, EOMI, oral membranes moist Neck: supple Cardiovascular: RRR Respiratory/Chest: CTA Bilaterally, non-labored  GI/Abdomen: BS +, non-tender, non-distended Ext: no clubbing or cyanosis, very mild LUE edema but overall improving Psych: pleasant and cooperative Skin: No breakdown noted on visible portion  Neuro:     He is awake and alert.   Oriented to month, day, hospital, self.  Mildly slow to process. + decreased memory.  +mild confusion-gradually improving,  Slightly dysarthric speech.   Language overall appears to be intact.  Left central 7.  Decreased sensation to light touch in left upper extremity and left lower extremity.  Moving all 4 extremities to gravity and resistance Mildly increased tone R hand present  -overall stable  3/4   Assessment/Plan: 1. Functional deficits which require 3+ hours per day of interdisciplinary therapy in a comprehensive inpatient rehab setting. Physiatrist is providing close team supervision and 24 hour management of active medical problems listed below. Physiatrist and rehab team continue to assess barriers to discharge/monitor patient progress toward functional and medical goals  Care Tool:  Bathing    Body parts bathed by patient: Left arm, Chest, Abdomen, Front perineal area, Right upper leg, Left upper leg, Right lower leg, Face   Body parts bathed by helper: Right arm, Buttocks, Left lower leg     Bathing assist Assist Level: Moderate Assistance - Patient 50 - 74%     Upper Body Dressing/Undressing Upper body dressing        Upper body assist Assist Level: Moderate Assistance - Patient 50 - 74%    Lower Body Dressing/Undressing Lower body dressing            Lower body assist Assist for lower body dressing: Maximal Assistance - Patient 25 - 49%     Toileting Toileting    Toileting assist Assist for toileting: Maximal Assistance - Patient 25 - 49%     Transfers  Chair/bed transfer  Transfers assist     Chair/bed transfer assist level: Minimal Assistance - Patient > 75%     Locomotion Ambulation   Ambulation assist   Ambulation activity did not occur: Safety/medical concerns  Assist level: 2 helpers (+2 for WC follow) Assistive device: Walker-rolling Max distance: 50 feet   Walk 10 feet activity   Assist  Walk 10 feet activity did not occur: Safety/medical concerns  Assist level: 2 helpers Assistive device: Walker-rolling   Walk 50 feet activity   Assist Walk 50 feet with 2 turns activity did not occur: Safety/medical concerns  Assist level: 2 helpers Assistive device: Walker-rolling    Walk 150 feet activity   Assist Walk 150 feet activity did not occur: Safety/medical concerns         Walk 10 feet on uneven surface   activity   Assist Walk 10 feet on uneven surfaces activity did not occur: Safety/medical concerns         Wheelchair     Assist Is the patient using a wheelchair?: Yes Type of Wheelchair: Manual    Wheelchair assist level: Minimal Assistance - Patient > 75% Max wheelchair distance: 50    Wheelchair 50 feet with 2 turns activity    Assist        Assist Level: Minimal Assistance - Patient > 75%   Wheelchair 150 feet activity     Assist      Assist Level: Maximal Assistance - Patient 25 - 49%   Blood pressure (!) 173/86, pulse 63, temperature 97.7 F (36.5 C), resp. rate 18, height 5\' 9"  (1.753 m), weight 89.5 kg, SpO2 100%.  Medical Problem List and Plan: 1. 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  Await plan for loop recorder             -patient may shower -ELOS/Goals: 21-25 days, min assist with PT, OT and supervision with SLP -Continue CIR therapies including PT, OT, and SLP  -Discharge date adjusted to 3/15 -grounds pass   2.  Antithrombotics: -DVT/anticoagulation:  Pharmaceutical: eliquis per pharmacy -03/07/23 now on eliquis per pharmacy -antiplatelet therapy: Aspirin 81 mg daily and Plavix 75 mg day x 3 weeks then Plavix alone -2/20 DC aspirin and Plavix as on heparin, discussed with neurology and cardiology-- 2/22 now on eliquis  3. Pain Management: Lidoderm patch as directed, Tylenol as needed 4. Mood/Sleep/sundowning: Effexor 150 mg every evening, provide emotional support             -antipsychotic agents: Seroquel 50 mg nighty at 2030, have back up dose             -keep sleep chart 5. Neuropsych/cognition: This patient is capable of making decisions on his own behalf. 6. Skin/Wound Care: Routine skin checks 7. Fluids/Electrolytes/Nutrition: Routine in and outs with follow-up chemistries, continue vitamins/supplements 8.  Hypothyroidism.  Synthroid  34mcg/day 9.  Hypertension. Norvasc 10 mg daily, Toprol-Xl 100 mg daily, Aldactone 25 mg daily.             -bp trending up. Likely will need additions to regimen  -02/22/23 BPs 140s/90s, not terrible, monitor trend with increase mobility  2/10 BP controlled, decrease spironolactone due to elevated CR/BUN -02/28/23 BP stable but creeping up, continue to monitor for now but may need to increase meds back -2/25 BP stable, hold losartan for now due to renal function -2/28 BP a little higher, restart low dose losartan 25mg   daily -3/3 increase losartan to 50 mg daily -3/4 Restart norvasc at lower dose 2.5mg    Vitals:   03/14/23 0422 03/14/23 1327 03/14/23 2100 03/15/23 0455  BP: (!) 161/88 (!) 145/95 138/79 (!) 156/98   03/15/23 0800 03/15/23 1300 03/15/23 2104 03/16/23 0441  BP: (!) 156/99 (!) 143/99 (!) 172/105 (!) 170/94   03/16/23 1343 03/16/23 1910 03/16/23 2100 03/17/23 0516  BP: (!) 154/89 (!) 189/104 (!) 150/90 (!) 173/86    10.H/O prostate cancer. Myrbetriq 50 mg daily. 11.Hyperlipidemia. Lipitor 80mg  daily 12.Constipation.             -miralax daily, SenokotS 1 tab daily -02/22/23 LBM 2days ago, monitor today, if no BM tomorrow then may want to increase regimen -2/25 LBM 2/23, consider addtional medication if no bowel movement today -3/4 LBM today 3/4 , continue to monitor 13.H/O gout. Zyloprim 300mg  daily 14.  AKI versus CKD  -Encourage oral fluid intake, decrease spironolactone to 12.5 mg  -2/11 recheck BMP today  Addendum- increased Cr, stop spironolactone, start IVF NS 46ml/HR  -2/12 Cr slightly improved, continue IVF, encourage oral fluids  -2/13 recheck BMP- CR down to 1.2  -2/14 DC IVF, discussed oral hydration  2/17 BUN/CR stable 17/1.25  -2/18 continue to encourage oral hydration -2/20 BUN and creatinine improved to 21/1.25, got IV fluids last night 56ml/hr   2/21 Encourage PO fluid intake  -03/07/23 Cr 0.99 today, poor PO intake, a little dizzy this morning, will give  IVFs @50ml /hr x12hrs now, and have standing PRN order for 55ml/hr x12hrs at bedtime if PO intake <1274ml/day -03/08/23 doing much better today after IVFs; leave standing order on for now, but encouraged pt to drink water throughout the day  2/24---give additional IVF today as Cr sl up 2/25 hold losartan, will hold off on IVF for now and encourage oral hydration.  Caution with IVF as x-ray chest with increased pulmonary congestion 2/27 50 mL/h IVF, gentle rate due to CHF history, started yesterday, continue today, creatinine slightly improved 2/28 Change IVF NS with potassium  to at night only, Cr and Bun improved. Can hold if drinking > fluids that day -03/14/23 doing well, monitor, hopefully can avoid using IVFs tonight 3/3 improved Cr/BUN continue to monitor  Will recheck BMP tomorrow make sure he is doing okay off IVF, encourage oral fluids  15.  History of CHF.  Last EF 55%, mild diastolic dysfunction  -Continue metoprolol, decrease spironolactone to 12.5 mg as above -2/14 Wts stable, has not appear to show signs of fluid overload, continue to monitor -02/28/23 wt up but doubt accuracy, monitor, no overload on exam -2/19-20 weight stable, does not appear to show signs of fluid overload, EF 40 to 45% on echo. -Cardiology has been following, they discontinued amlodipine and started losartan 25 mg daily-beginning tomorrow-- signing off 2/22 (see recs below)  -Follow-up [facility]ology at [facility] postdischarge  -2/27 weight is overall stable, continue to monitor  -2/28 restart losartan  -3/4 weight stable, discussed with patient and family today Filed Weights   03/15/23 0509 03/16/23 0546 03/17/23 0516  Weight: 88.9 kg 88.8 kg 89.5 kg    16.  Severe cervical stenosis: Consider neurosurgery outpatient follow-up 17. Insomnia -Patient has Seroquel 50 mg scheduled and 25 mg as needed.  Will also add in 3 mg melatonin at bedtime  -2/11 increase Seroquel to 75 mg nightly  -2/13 asked nursing  to complete sleep-wake chart  -2/14 reports sleeping better, continue current regimen for now and monitor  -  2/20 will decrease Seroquel back to 50 mg nightly and 25 mg at bedtime  2/21 Decrease seroquel to 25mg  nightly and 25mg  PRN  2/25 increase Seroquel scheduled back to 50 nightly and 25 as needed  -2/28-3/4 reports he continues to sleep well, continue current regimen  18.  Bleeding from his penis  -UA neg,HGB was stable on 2/11 -Denies additional occurrences.  During team meeting was noted this happened intermittently during acute hospital stay  19. Nasal congestion: Flonase 1 spray daily  20. Staring episodes/vision changes 03/01/23 -03/01/23 having staring episodes and ?chronic vision changes/hemianopsia? Spoke with Dr. Derry Lory of neuro, will get 24hr EEG and head CT, no code stroke at this point; they will see pt, appreciate their assistance.  -2/17 overnight EEG neg, CT head without acute changes  21. Chest pain  -EKG, Trop, CXR ordered -Cardiology following appreciate assistance.  D-dimer positive, getting CT PE study to rule out PE.  If positive will need to discuss with neurology regarding anticoagulation.  He is now on 2 L nasal cannula oxygen  -2/20 patient reports his chest pain is resolved  22. Pulmonary embolism / DVT left popliteal vein -2/19 discussed with neurology and cardiology. Heparin grip started, wife updated by phone. Critical care medicine consulted 2/20 seen by critical care, continue heparin eventual transition to DOAC, if hemodynamic changes contact PCCM again. Advised 24h bedrest with [facility] 2/21 plan to transition to DOAC later today, discussed with pharmacy by phone  -03/07/23 cardiology saw pt, recommend eliquis per protolocol, continue toprol/low dose losartan, and they signed off-- f/up in 3-4wks with Dr. Odis Hollingshead  23. Leukocytosis: 13k today -SLP felt that he was having more difficulties from a cognitive standpoint today  -he is afebrile. No cough or  respiratory sx  -will check UA, UCX- UA does not indicate infection  -recheck CBC in AM  -CXR yesterday with pulmonary vascular congestion  -WBC WNL 3/3  24. Mild hyponatremia, mild continue to monitor   -3/3 stable 136  Recheck tomorrow   LOS: 24 days A FACE TO FACE EVALUATION WAS PERFORMED  Fanny Dance 03/17/2023, 10:37 AM

## 2023-03-17 NOTE — Consult Note (Signed)
 Neuropsychological Consultation Comprehensive Inpatient Rehab   Patient:   Edward Rodgers   DOB:   [birth year]  MR Number:  [id number]  Location:  [facility] [address] [place] [address] Dept: [phone] Loc: [phone]           Date of Service:   03/17/2023  Start Time:   2 PM End Time:   3 PM  Provider/Observer:  Arley Phenix, Psy.D.       Clinical Neuropsychologist       Billing Code/Service: [phone]  Reason for Service:    Edward Rodgers is a 73 year old male referred for neuropsychological consultation during his ongoing admission to the comprehensive attention battery after recent CVA.  Patient has a past medical history including diastolic congestive heart failure, gout, hypothyroidism, prior cerebellar infarct with residual diplopia, prostate cancer and tobacco use.  Patient presented on 02/02/2023 with left-sided weakness and numbness and disconjugate gaze.  Neuroimaging showed diffuse hyperintensity in the right PCA distribution specifically in the medial temporal lobe and thalamus.  Chronic small vessel ischemic disease was noted and left anterior parafalcine meningioma noted.  CTA identified right P2 occlusion presumably acute and the assessment.  Patient did receive TNK.  MRI follow-up revealed progressive acute right PCA territory infarct with extension most notably into the right parietal white matter and occipital lobe.  There was concern for subcentimeter hemorrhage clinically in the right thalamic region.  Small infarct in the left occipital lobe also noted.  Patient was admitted to CIR due to decrease in functional ability along with left-sided weakness and cognitive change.  During today's clinical visit the patient and his wife are both present.  Patient is noted to have ongoing memory changes as well as sensorimotor symptoms consistent with thalamic involvement with his recent stroke.   Patient was oriented for the most part but slowed information processing speed and some difficulties with executive functioning and cognitive processing noted.  Patient's wife notes steady improvement in his overall symptoms.  Patient displayed some confusion and difficulty with some of the safety equipment present and continued to set off his warning alarms.  Patient had difficulty problem-solving how to keep from setting them off.  HPI for the current admission:    HPI: Edward Rodgers is a 73 year old right-handed male with history of diastolic congestive heart failure, gout, hypothyroidism, prior cerebellar infarct with residual diplopia maintained on full-strength aspirin, prostate cancer, tobacco use. Per chart review patient lives with spouse. Two-level home bed and bath on main level one-step to entry. Modified independent prior to admission. Presented to 02/02/2023 with left-sided weakness numbness and disconjugate gaze. CT/MRI showed diffusion hyperintensity in the right PCA distribution affecting the medial temporal lobe and thalamus. 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 CTA right P2 occlusion presumably acute in the setting. Extensive intracranial atherosclerosis. CT perfusion of the brain showed increased ischemic range perfusion on the right PCA distribution measuring 36 cc as compared to 25 cc on prior imaging. Admission chemistries unremarkable except glucose 115 BUN 24, hemoglobin A1c 6.4. Echocardiogram with ejection fraction of 55% no wall motion abnormalities grade 1 diastolic dysfunction. Patient did receive TNK. MRI follow-up revealed progressive acute right PCA territory infarct with extension most notable in the right periatrial white matter and occipital lobe. There was suspected subcentimeter hemorrhage centrally in the right thalamic infarct. New punctate infarct in the left occipital lobe also seen. Neurology follow-up placed on low-dose  aspirin and  Plavix for CVA prophylaxis x 3 weeks then Plavix alone. Subcutaneous heparin for DVT prophylaxis. Await plan for loop recorder. Mild AKI 1.20-1.44 placed on gentle IV fluids since discontinued with latest creatinine 1.23. Therapy evaluations completed due to patient decreased functional mobility and left-sided weakness was admitted for a comprehensive rehab program.   Medical History:   Past Medical History:  Diagnosis Date   Cancer (HCC) 2002   prostate   CHF (congestive heart failure) (HCC)    Gout    Hypertension    Thyroid disease          Patient Active Problem List   Diagnosis Date Noted   Cognitive change 03/17/2023   Hyponatremia 03/13/2023   Pulmonary embolus (HCC) 03/05/2023   Acute deep vein thrombosis (DVT) of popliteal vein of left lower extremity (HCC) 03/05/2023   Chronic heart failure with mildly reduced ejection fraction (HFmrEF, 41-49%) (HCC) 03/05/2023   AKI (acute kidney injury) (HCC) 03/05/2023   Precordial pain 03/03/2023   Shortness of breath 03/03/2023   Benign hypertension 03/03/2023   Heart failure with improved ejection fraction (HFimpEF) (HCC) 03/03/2023   Coping style affecting medical condition 02/27/2023   Acute right PCA stroke (HCC) 02/21/2023   Acute ischemic right PCA stroke (HCC) 02/14/2023   Posterior cerebral artery embolism, right 02/14/2023   Special screening for malignant neoplasms, colon 04/09/2017   Dizziness 03/29/2013   Cerebellar stroke, acute (HCC) 03/29/2013   CHF (congestive heart failure) (HCC) 03/28/2013   Vertigo 03/28/2013    Behavioral Observation/Mental Status:   Edward Rodgers  presents as a 73 y.o.-year-old Right handed African American Male who appeared his stated age. his dress was Appropriate and he was Well Groomed and his manners were Appropriate to the situation.  his participation was indicative of Appropriate and Inattentive behaviors.  There were physical disabilities noted.  he displayed an appropriate level of  cooperation and motivation.    Interactions:    Active Inattentive  Attention:   abnormal and attention span appeared shorter than expected for age  Memory:   abnormal; remote memory intact, recent memory impaired  Visuo-spatial:   not examined  Speech (Volume):  low  Speech:   normal; normal  Thought Process:  Coherent and Circumstantial  Coherent  Though Content:  WNL; not suicidal and not homicidal  Orientation:   person, place, and situation  Judgment:   Fair  Planning:   Poor  Affect:    Blunted  Mood:    Dysphoric  Insight:   Fair  Intelligence:   normal  Psychiatric History:  No prior psychiatric history  The patient has been prescribed Seroquel at night to aid with sleep and agitation/stress response at night along with continuation of home medication Effexor.  Family Med/Psych History: History reviewed. No pertinent family history.  Impression/DX:   DEMETREUS LOTHAMER is a 73 year old male referred for neuropsychological consultation during his ongoing admission to the comprehensive attention battery after recent CVA.  Patient has a past medical history including diastolic congestive heart failure, gout, hypothyroidism, prior cerebellar infarct with residual diplopia, prostate cancer and tobacco use.  Patient presented on 02/02/2023 with left-sided weakness and numbness and disconjugate gaze.  Neuroimaging showed diffuse hyperintensity in the right PCA distribution specifically in the medial temporal lobe and thalamus.  Chronic small vessel ischemic disease was noted and left anterior parafalcine meningioma noted.  CTA identified right P2 occlusion presumably acute and the assessment.  Patient did receive TNK.  MRI follow-up revealed  progressive acute right PCA territory infarct with extension most notably into the right parietal white matter and occipital lobe.  There was concern for subcentimeter hemorrhage clinically in the right thalamic region.  Small infarct in the  left occipital lobe also noted.  Patient was admitted to CIR due to decrease in functional ability along with left-sided weakness and cognitive change.  During today's clinical visit the patient and his wife are both present.  Patient is noted to have ongoing memory changes as well as sensorimotor symptoms consistent with thalamic involvement with his recent stroke.  Patient was oriented for the most part but slowed information processing speed and some difficulties with executive functioning and cognitive processing noted.  Patient's wife notes steady improvement in his overall symptoms.  Patient displayed some confusion and difficulty with some of the safety equipment present and continued to set off his warning alarms.  Patient had difficulty problem-solving how to keep from setting them off.         Electronically Signed   _______________________ Arley Phenix, Psy.D. Clinical Neuropsychologist

## 2023-03-18 DIAGNOSIS — Z8546 Personal history of malignant neoplasm of prostate: Secondary | ICD-10-CM

## 2023-03-18 LAB — BASIC METABOLIC PANEL
Anion gap: 10 (ref 5–15)
BUN: 11 mg/dL (ref 8–23)
CO2: 25 mmol/L (ref 22–32)
Calcium: 9.5 mg/dL (ref 8.9–10.3)
Chloride: 104 mmol/L (ref 98–111)
Creatinine, Ser: 1.27 mg/dL — ABNORMAL HIGH (ref 0.61–1.24)
GFR, Estimated: >60 mL/min (ref >60)
Glucose, Bld: 102 mg/dL — ABNORMAL HIGH (ref 70–99)
Potassium: 3.7 mmol/L (ref 3.5–5.1)
Sodium: 139 mmol/L (ref 135–145)

## 2023-03-18 MED ORDER — AMLODIPINE BESYLATE 5 MG PO TABS
5.0000 mg | ORAL_TABLET | Freq: Every day | ORAL | Status: DC
  Administered 2023-03-19: 5 mg via ORAL
  Filled 2023-03-18: qty 1

## 2023-03-18 NOTE — Progress Notes (Signed)
 PROGRESS NOTE   Subjective/Complaints:  Patient working with speech therapy today, doing very well overall.  No new complaints or concerns elicited.  Reports he still sleeping well.  Denies issues with pain.   ROS: Patient denies fever, chills,  dizziness, nausea, vomiting, diarrhea, constipation shortness of breath or chest pain, headache, or mood change. Insomnia- improved   Objective:   No results found.   Recent Labs    03/16/23 0617  WBC 6.2  HGB 11.5*  HCT 35.8*  PLT 255     Recent Labs    03/16/23 0617 03/18/23 0612  NA 136 139  K 3.5 3.7  CL 106 104  CO2 22 25  GLUCOSE 102* 102*  BUN 8 11  CREATININE 1.08 1.27*  CALCIUM 9.2 9.5        Intake/Output Summary (Last 24 hours) at 03/18/2023 1206 Last data filed at 03/18/2023 0900 Gross per 24 hour  Intake 600 ml  Output 200 ml  Net 400 ml         Physical Exam: Vital Signs Blood pressure (!) 172/98, pulse 74, temperature 97.8 F (36.6 C), resp. rate 18, height 5\' 9"  (1.753 m), weight 90.3 kg, SpO2 100%.  Constitutional: No distress . Vital signs reviewed.  Sitting in bedside chair working with therapy HEENT: NCAT, EOMI, oral membranes moist Neck: supple Cardiovascular: RRR Respiratory/Chest: CTA Bilaterally, non-labored  GI/Abdomen: BS +, non-tender, non-distended Ext: no clubbing or cyanosis, very mild LUE edema but overall improving Psych: pleasant and cooperative Skin: No breakdown noted on visible portion  Neuro:     He is awake and alert.   Oriented to month, day, hospital, self.  Mildly slow to process. + decreased memory.  +mild confusion-gradually improving,  Slightly dysarthric speech.   Language overall appears to be intact.  Left central 7.  Decreased sensation to light touch in left upper extremity and left lower extremity.  Moving all 4 extremities to gravity and resistance Increased tone L hand -overall stable  3/5   Assessment/Plan: 1. Functional deficits which require 3+ hours per day of interdisciplinary therapy in a comprehensive inpatient rehab setting. Physiatrist is providing close team supervision and 24 hour management of active medical problems listed below. Physiatrist and rehab team continue to assess barriers to discharge/monitor patient progress toward functional and medical goals  Care Tool:  Bathing    Body parts bathed by patient: Left arm, Chest, Abdomen, Front perineal area, Right upper leg, Left upper leg, Right lower leg, Face   Body parts bathed by helper: Right arm, Buttocks, Left lower leg     Bathing assist Assist Level: Moderate Assistance - Patient 50 - 74%     Upper Body Dressing/Undressing Upper body dressing        Upper body assist Assist Level: Moderate Assistance - Patient 50 - 74%    Lower Body Dressing/Undressing Lower body dressing            Lower body assist Assist for lower body dressing: Maximal Assistance - Patient 25 - 49%     Toileting Toileting    Toileting assist Assist for toileting: Maximal Assistance - Patient 25 - 49%     Transfers  Chair/bed transfer  Transfers assist     Chair/bed transfer assist level: Minimal Assistance - Patient > 75%     Locomotion Ambulation   Ambulation assist   Ambulation activity did not occur: Safety/medical concerns  Assist level: 2 helpers (+2 for WC follow) Assistive device: Walker-rolling Max distance: 50 feet   Walk 10 feet activity   Assist  Walk 10 feet activity did not occur: Safety/medical concerns  Assist level: 2 helpers Assistive device: Walker-rolling   Walk 50 feet activity   Assist Walk 50 feet with 2 turns activity did not occur: Safety/medical concerns  Assist level: 2 helpers Assistive device: Walker-rolling    Walk 150 feet activity   Assist Walk 150 feet activity did not occur: Safety/medical concerns         Walk 10 feet on uneven surface   activity   Assist Walk 10 feet on uneven surfaces activity did not occur: Safety/medical concerns         Wheelchair     Assist Is the patient using a wheelchair?: Yes Type of Wheelchair: Manual    Wheelchair assist level: Minimal Assistance - Patient > 75% Max wheelchair distance: 50    Wheelchair 50 feet with 2 turns activity    Assist        Assist Level: Minimal Assistance - Patient > 75%   Wheelchair 150 feet activity     Assist      Assist Level: Maximal Assistance - Patient 25 - 49%   Blood pressure (!) 172/98, pulse 74, temperature 97.8 F (36.6 C), resp. rate 18, height 5\' 9"  (1.753 m), weight 90.3 kg, SpO2 100%.  Medical Problem List and Plan: 1. 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  Await plan for loop recorder             -patient may shower -ELOS/Goals: 21-25 days, min assist with PT, OT and supervision with SLP -Continue CIR therapies including PT, OT, and SLP  -Discharge date adjusted to 3/15 -grounds pass -Left WHO ordered -Team conference today please see physician documentation under team conference tab, met with team  to discuss problems,progress, and goals. Formulized individual treatment plan based on medical history, underlying problem and comorbidities.     2.  Antithrombotics: -DVT/anticoagulation:  Pharmaceutical: eliquis per pharmacy -03/07/23 now on eliquis per pharmacy -antiplatelet therapy: Aspirin 81 mg daily and Plavix 75 mg day x 3 weeks then Plavix alone -2/20 DC aspirin and Plavix as on heparin, discussed with neurology and cardiology-- 2/22 now on eliquis  3. Pain Management: Lidoderm patch as directed, Tylenol as needed 4. Mood/Sleep/sundowning: Effexor 150 mg every evening, provide emotional support             -antipsychotic agents: Seroquel 50 mg nighty at 2030, have back up dose             -keep sleep chart 5.  Neuropsych/cognition: This patient is capable of making decisions on his own behalf. 6. Skin/Wound Care: Routine skin checks 7. Fluids/Electrolytes/Nutrition: Routine in and outs with follow-up chemistries, continue vitamins/supplements 8.  Hypothyroidism.  Synthroid 44mcg/day 9.  Hypertension. Norvasc 10 mg daily, Toprol-Xl 100 mg daily, Aldactone 25 mg daily.             -bp trending up. Likely will need additions to regimen  -02/22/23 BPs 140s/90s, not terrible, monitor trend with increase mobility  2/10 BP controlled, decrease spironolactone due to elevated CR/BUN -02/28/23 BP  stable but creeping up, continue to monitor for now but may need to increase meds back -2/25 BP stable, hold losartan for now due to renal function -2/28 BP a little higher, restart low dose losartan 25mg  daily -3/3 increase losartan to 50 mg daily -3/4 Restart norvasc at lower dose 2.5mg  -3/5 increase norvasc to 5mg  daily starting tomorrow    Vitals:   03/15/23 2104 03/16/23 0441 03/16/23 1343 03/16/23 1910  BP: (!) 172/105 (!) 170/94 (!) 154/89 (!) 189/104   03/16/23 2100 03/17/23 0516 03/17/23 1122 03/17/23 1300  BP: (!) 150/90 (!) 173/86 (!) 139/99 130/72   03/17/23 1935 03/17/23 1953 03/18/23 0453 03/18/23 1334  BP: (!) 150/91 (!) 157/93 (!) 172/98 (!) 152/89    10.H/O prostate cancer. Myrbetriq 50 mg daily.  -3/5 occasional incontinence but improving 11.Hyperlipidemia. Lipitor 80mg  daily 12.Constipation.             -miralax daily, SenokotS 1 tab daily -02/22/23 LBM 2days ago, monitor today, if no BM tomorrow then may want to increase regimen -2/25 LBM 2/23, consider addtional medication if no bowel movement today -3/5 LBM today, having fairly regular bowel movements 13.H/O gout. Zyloprim 300mg  daily 14.  AKI versus CKD  -Encourage oral fluid intake, decrease spironolactone to 12.5 mg  -2/11 recheck BMP today  Addendum- increased Cr, stop spironolactone, start IVF NS 47ml/HR  -2/12 Cr slightly  improved, continue IVF, encourage oral fluids  -2/13 recheck BMP- CR down to 1.2  -2/14 DC IVF, discussed oral hydration  2/17 BUN/CR stable 17/1.25  -2/18 continue to encourage oral hydration -2/20 BUN and creatinine improved to 21/1.25, got IV fluids last night 54ml/hr   2/21 Encourage PO fluid intake  -03/07/23 Cr 0.99 today, poor PO intake, a little dizzy this morning, will give IVFs @50ml /hr x12hrs now, and have standing PRN order for 74ml/hr x12hrs at bedtime if PO intake <1266ml/day -03/08/23 doing much better today after IVFs; leave standing order on for now, but encouraged pt to drink water throughout the day  2/24---give additional IVF today as Cr sl up 2/25 hold losartan, will hold off on IVF for now and encourage oral hydration.  Caution with IVF as x-ray chest with increased pulmonary congestion 2/27 50 mL/h IVF, gentle rate due to CHF history, started yesterday, continue today, creatinine slightly improved 2/28 Change IVF NS with potassium  to at night only, Cr and Bun improved. Can hold if drinking > fluids that day -03/14/23 doing well, monitor, hopefully can avoid using IVFs tonight 3/3 improved Cr/BUN continue to monitor  Will recheck BMP tomorrow make sure he is doing okay off IVF, encourage oral fluids 3/5 BUN/Cr a little higher, continue to encourage oral fluid intake.  Patient has set alarm to drink fluids 15.  History of CHF.  Last EF 55%, mild diastolic dysfunction  -Continue metoprolol, decrease spironolactone to 12.5 mg as above -2/14 Wts stable, has not appear to show signs of fluid overload, continue to monitor -02/28/23 wt up but doubt accuracy, monitor, no overload on exam -2/19-20 weight stable, does not appear to show signs of fluid overload, EF 40 to 45% on echo. -Cardiology has been following, they discontinued amlodipine and started losartan 25 mg daily-beginning tomorrow-- signing off 2/22 (see recs below)  -Follow-up with cardiology at the [facility]  postdischarge  -2/27 weight is overall stable, continue to monitor  -2/28 restart losartan  -3/4-5 weight trending a little up, no signs of fluid overload at this time [facility] Weights   03/16/23 0546  03/17/23 0516 03/18/23 0500  Weight: 88.8 kg 89.5 kg 90.3 kg    16.  Severe cervical stenosis: Consider neurosurgery outpatient follow-up 17. Insomnia -Patient has Seroquel 50 mg scheduled and 25 mg as needed.  Will also add in 3 mg melatonin at bedtime  -2/11 increase Seroquel to 75 mg nightly  -2/13 asked nursing to complete sleep-wake chart  -2/14 reports sleeping better, continue current regimen for now and monitor  -2/20 will decrease Seroquel back to 50 mg nightly and 25 mg at bedtime  2/21 Decrease seroquel to 25mg  nightly and 25mg  PRN  2/25 increase Seroquel scheduled back to 50 nightly and 25 as needed  -2/28-3/5 reports he continues to sleep well, continue current regimen  18.  Bleeding from his penis  -UA neg,HGB was stable on 2/11 -Denies additional occurrences.  During team meeting was noted this happened intermittently during acute hospital stay  19. Nasal congestion: Flonase 1 spray daily  20. Staring episodes/vision changes 03/01/23 -03/01/23 having staring episodes and ?chronic vision changes/hemianopsia? Spoke with Dr. Derry Lory of neuro, will get 24hr EEG and head CT, no code stroke at this point; they will see pt, appreciate their assistance.  -2/17 overnight EEG neg, CT head without acute changes  21. Chest pain  -EKG, Trop, CXR ordered -Cardiology following appreciate assistance.  D-dimer positive, getting CT PE study to rule out PE.  If positive will need to discuss with neurology regarding anticoagulation.  He is now on 2 L nasal cannula oxygen  -2/20 patient reports his chest pain is resolved  22. Pulmonary embolism / DVT left popliteal vein -2/19 discussed with neurology and cardiology. Heparin grip started, wife updated by phone. Critical care medicine  consulted 2/20 seen by critical care, continue heparin eventual transition to DOAC, if hemodynamic changes contact PCCM again. Advised 24h bedrest with [facility] 2/21 plan to transition to DOAC later today, discussed with pharmacy by phone  -03/07/23 cardiology saw pt, recommend eliquis per protolocol, continue toprol/low dose losartan, and they signed off-- f/up in 3-4wks with Dr. Odis Hollingshead  23. Leukocytosis: 13k today -SLP felt that he was having more difficulties from a cognitive standpoint today  -he is afebrile. No cough or respiratory sx  -will check UA, UCX- UA does not indicate infection  -recheck CBC in AM  -CXR yesterday with pulmonary vascular congestion  -WBC WNL 3/3  24. Mild hyponatremia, mild continue to monitor   -3/5 stable at 139  LOS: 25 days A FACE TO FACE EVALUATION WAS PERFORMED  Fanny Dance 03/18/2023, 12:06 PM

## 2023-03-18 NOTE — Progress Notes (Signed)
 Nurse took weight in room at this time.

## 2023-03-18 NOTE — Progress Notes (Signed)
 Orthopedic Tech Progress Note Patient Details:  Edward Rodgers 17-Jan-1950 [id number]  Called in order to [employer] for a RESTING HAND SPLINT   Patient ID: Edward Rodgers, male   DOB: December 18, 1950, 73 y.o.   MRN: [id number]  [name] 03/18/2023, 5:31 PM

## 2023-03-18 NOTE — Progress Notes (Signed)
 Patient ID: Edward Rodgers, male   DOB: 1950/02/12, 73 y.o.   MRN: [id number]  Met with pt and spoke with wife via telephone to discuss team conference progress this week and will still require 24/7 physical care at discharge and the ned for hands on education with both she and pt;s brother. Have scheduled her for 3/11 from 1;00-4:00 and she will check with brother. Will do this early enough to have come back for additional sessions. Discussed again the ned for a ramp she reports there is only one step in but will ned to have a ramp there when discharged home. Work on discharge needs.

## 2023-03-18 NOTE — Progress Notes (Signed)
 Occupational Therapy Session Note  Patient Details  Name: Edward Rodgers MRN: [id number] Date of Birth: 1950-01-27  Today's Date: 03/18/2023 OT Individual Time: 4098-1191 OT Individual Time Calculation (min): 77 min    Short Term Goals: Week 4:  OT Short Term Goal 1 (Week 4): Pt will perform BSC transfer with consitent Min A + LRAD. OT Short Term Goal 2 (Week 4): Pt will perform LB dressing with consistent Mod A + LRAD. OT Short Term Goal 3 (Week 4): Pt will perform 2/3 toileting activities with Min A + LRAD.  Skilled Therapeutic Interventions/Progress Updates:  Pt greeted resting in bed for skilled OT session with focus on BADL retraining.   Pain: Pt with no reports of pain, OT offering intermediate rest breaks and positioning suggestions throughout session to address potential pain/fatigue and maximize participation/safety in session.   Functional Transfers: Supine>sit EOB with supervision/cuing with use of bed features and increased time. Toilet transfer with use of stedy for urgency/time management, pt at supervision level for STS onto equipment. Sit<>stand from [facility] for LB clothing management at Min-Mod A, using RW for standing balance at Min-Mod A. CGA-Min A for sitting balance due to posterior bias at EOB.   Self Care Tasks: Pt completes the following self care tasks with levels of assistance noted below, UB: Bathing/dressing at Min A level for management/integration of LUE (including application of deodorant), as patient able to initiate functional reach towards RUE/underarm but is unable to maintain activation for thorough cleansing. Re-education required for hemi-dressing techniques, Min A for management/entanglement of material over IV site on RUE. LB: Threading of BLE into sweat-pants with Min A, cuing provided for use of LUE grip at diminished level. Pt able to hike sweat-pants over bottom with Min A for L-hip. Max A for footwear due to time constraints. Toileting at [employer] A level  using stedy for standing balance, pt requiring assistance for hike of brief over L-hip, completing pericare in standing with CGA and setup of wash-cloth Sink-side oral care with setup A (for time)   Pt remained sitting in standard WC with 4Ps assessed and immediate needs met. Pt continues to be appropriate for skilled OT intervention to promote further functional independence in ADLs/IADLs.   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 Restrictions Weight Bearing Restrictions Per Provider Order: No   Therapy/Group: Individual Therapy  Lou Cal, OTR/L, MSOT  03/18/2023, 4:45 AM

## 2023-03-18 NOTE — Progress Notes (Signed)
 Physical Therapy Session Note  Patient Details  Name: Edward Rodgers MRN: [id number] Date of Birth: 11/22/1950  Today's Date: 03/18/2023 PT Individual Time: 4098-1191 PT Individual Time Calculation (min): 59 min   Short Term Goals: Week 4:  PT Short Term Goal 1 (Week 4): pt will perform bed mobility with CGA or less consistently PT Short Term Goal 2 (Week 4): pt will perform stand pivot transfer consistently with mod A or less PT Short Term Goal 3 (Week 4): pt will ambulate with LRAD for neuromuscular reeducation  Skilled Therapeutic Interventions/Progress Updates:      Pt supine in bed upon arrival. Pt agreeable to therapy. Pt denies any pain.   Pt performed supine to sit with bed flat and use of bedrails with CGA, supervision, vebral cues provided for technique and attention/use of L UE.   Pt seated EOB donned R shoe with set up assist and L shoe with max A.   Pt performed stand pivot transfer bed to [facility]; hospital WC to loaner WC with min-mod A, pt demos improved carry over of need to scoot bottom closer to surface for improved head hip ratio. Pt able to recall that this is his loaner chair and he will get his custom WC post discharge.   Adjusted leg rests, arms rest to appropriate fit for pt.   Pt self propelled WC with supervision with R UE/R LE, with intermittent verbal cues for obstacle negotiation on L LE, verbal cues provided for horizontal head turns to L for obstacle negotiation.   Education provided how to donn/doff leg rests and donn/doff brakes. Pt demos improved recall of need to lock brakes prior to standing/scooting back in WC. Pt able to donn/doff brakes with supervision with R UE, verbal cues provided how to manage arm tray.   Pt requesting to use bathroom. Therapist utilized steady 2/2 urgency. Pt performed sit to stand, stand to sit with supervision, pt continent of urine. Pt stood with supervision, and donned pants with sup/min A intermittently, verbal cues provided  for correction of L lateral trunk lean, and L LE quad activation.   Pt seated in WC at end of session with all needs within reach and seatbelt alarm on.   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 Restrictions Weight Bearing Restrictions Per Provider Order: No   Therapy/Group: Individual Therapy  [facility] [place], [place], DPT  03/18/2023, 7:50 AM

## 2023-03-18 NOTE — Progress Notes (Signed)
 Speech Language Pathology Daily Session Note  Patient Details  Name: Edward Rodgers MRN: [id number] Date of Birth: 1951/01/03  Today's Date: 03/18/2023 SLP Individual Time: 0906-1000 SLP Individual Time Calculation (min): 54 min  Short Term Goals: Week 4: SLP Short Term Goal 1 (Week 4): Pt will complete functional problem solving tasks @ 90% accuracy given modA SLP Short Term Goal 2 (Week 4): Pt will utilize external memory aids as needed to recall recent/relevant information w/ 80% accuracy given modA visual/verbal cues SLP Short Term Goal 3 (Week 4): When prompted, pt will demonstrate awareness of errors in 3/5 attempts with modA  Skilled Therapeutic Interventions: SLP conducted skilled therapy session targeting cognitive retraining goals. Patient much improved this session compared to previous sessions. Recalled events from the morning and from previous day with 90% accuracy with supervision to min assist. During updating of memory book from the morning's OT session, patient set up and spaced entries accurately with supervision-min assist. SLP facilitated basic to mildly complex visuospatial problem solving task with patient benefiting from supervision to min assist. In remaining minutes of session, patient competed in novel card game and benefited from supervision for sequencing, min to mod assist for working memory and mod assist for processing of complex information. Patient was left in chair with call bell in reach and chair alarm set. SLP will continue to target goals per plan of care.       Pain Pain Assessment Pain Scale: 0-10 Pain Score: 0-No pain  Therapy/Group: Individual Therapy  [name], M.A., CCC-SLP  [name] 03/18/2023, 10:01 AM

## 2023-03-18 NOTE — Plan of Care (Signed)
  Problem: Consults Goal: RH STROKE PATIENT EDUCATION Description: See Patient Education module for education specifics  Outcome: Progressing   Problem: RH BOWEL ELIMINATION Goal: RH STG MANAGE BOWEL WITH ASSISTANCE Description: STG Manage Bowel with min Assistance. Outcome: Progressing   Problem: RH BOWEL ELIMINATION Goal: RH STG MANAGE BOWEL W/MEDICATION W/ASSISTANCE Description: STG Manage Bowel with Medication with mod I  Assistance. Outcome: Progressing   Problem: RH BLADDER ELIMINATION Goal: RH STG MANAGE BLADDER WITH MEDICATION WITH ASSISTANCE Description: STG Manage Bladder With Medication With mod I Assistance. 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

## 2023-03-18 NOTE — Patient Care Conference (Signed)
 Inpatient RehabilitationTeam Conference and Plan of Care Update Date: 03/18/2023   Time: 12:03 PM    Patient Name: Edward Rodgers      Medical Record Number: [id number]  Date of Birth: 02/27/1950 Sex: Male         Room/Bed: 4M04C/4M04C-01 Payor Info: Payor: [employer] / Plan: [employer] MEDICARE / Product Type: *No Product type* /    Admit Date/Time:  02/21/2023  2:28 PM  Primary Diagnosis:  Acute right PCA stroke [facility])  Hospital Problems: Principal Problem:   Acute right PCA stroke (HCC) Active Problems:   Coping style affecting medical condition   Precordial pain   Shortness of breath   Benign hypertension   Heart failure with improved ejection fraction (HFimpEF) (HCC)   Pulmonary embolus (HCC)   Acute deep vein thrombosis (DVT) of popliteal vein of left lower extremity (HCC)   Chronic heart failure with mildly reduced ejection fraction (HFmrEF, 41-49%) (HCC)   AKI (acute kidney injury) (HCC)   Hyponatremia   Cognitive change    Expected Discharge Date: Expected Discharge Date: 03/28/23  Team Members Present: Physician leading conference: Dr. Fanny Dance Social Worker Present: Dossie Der, LCSW Nurse Present: Chana Bode, RN PT Present: Ambrose Finland, PT OT Present: Lou Cal, OT SLP Present: [name], SLP PPS Coordinator present : Fae Pippin, SLP     Current Status/Progress Goal Weekly Team Focus  Bowel/Bladder      Continent of bladder with bowel;     Maintain continence with  toileting    Toileting  Swallow/Nutrition/ Hydration                ADL's   Min A for UB ADLs, overall Mod A for LB ADLs & toileting, static balance during ADLs at CGA-Min, closer to Mod A with unilateral support and increased fatigue. Barriers: Decreased attention to LUE (despite strength to be at gross assist), fluctuating levels of activity tolerance, and caregiver support.   Min A overall   Functional transfers, LUE NMR, caregiver education.     Mobility   Pt performing bed mobility with sup/min A, sit to stand with min-mod A , stand/squat pivot transfer with min-mod A, and max A intermittent with fatigue, WC mobility with hemi technique and supervision and increased time , gait for neuromuscular reeducation with mod-max A. WC consult completed 3/4 with Nu motion for custom manual WC.  Plan to continue caregiver education throughout remainder of stay.  sup/min A  D/C 3/15, follow up HHPT, loaner WC should be delivered at 2:00 3/5  barriers to discharge: pt wife has been encouraged by therapy to attend hands on training however has not made an effort to attend sessions. pt brother attends family training intermittently however is deferring personal care to patient wife. Home measurement sheet and information for ramp has been given twice, (1x to pt brother at start of stay, and 1x given to pt wife on 3/4), therapy strongly encouraging ramp installation however it appears little effort has been [employer].    Communication                Safety/Cognition/ Behavioral Observations  supervision to min assist for attention to task, mod assist to recall of daily events, awareness improving   min assist   basic problem solving, sustained attention, awareness, memory strategies and consistency to utilize external memory aids    Pain      N/a           Skin  N/a           Discharge Planning:  Wife and brother will need to begin hands on education in prepartion for DC. Pt tends to wax and wane hopeuflly can be more consistent in his levels. Family aware will need a ramp for home. Have reached out to the [place] it will take time to get an aide and any equipment needed   Team Discussion: Patient post right PCA CVA with lethargy , wax/waning of cognitive status and poor appetite. Progress limited by bilateral PEs and DVT with episodes of increased drowsiness and poor activity tolerance. Continues to have left inattention, and  poor activity tolerance.   Patient on target to meet rehab goals: Currently needs mod assist overall for ADLs, min assist for upper body care and mod assist for toileting. Needs min - mod assist for sit -stand and squat pivot transfers. Needs supervision for sustained attention to tasks and min assist for recall, awareness, problem solving.    *See Care Plan and progress notes for long and short-term goals.   Revisions to Treatment Plan:  Home management sheet for home layout Memory book Alarm for fluid intake reminder   Teaching Needs: Safety, medications, transfers, toileting, skin care, etc.   Current Barriers to Discharge: Decreased caregiver support and Home enviroment access/layout  Possible Resolutions to Barriers: Family education Ramp for entry to home recommended Transportation to the home DME: hospital bed, [facility] follow up services     Medical Summary Current Status: CVA, PE, AKI, incontinence, HTN, hyponatremia, insomnia, CHF  Barriers to Discharge: Cardiac Complications;Electrolyte abnormality;Self-care education;Incontinence;Medical stability;Uncontrolled Hypertension;Renal Insufficiency/Failure  Barriers to Discharge Comments: CVA, PE, AKI, incontinence, HTN, hyponatremia, insomnia, CHF Possible Resolutions to [employer] Focus: Ramp placement, Encourage fluids, adjust BP medications, monitor labs and weight   Continued Need for Acute Rehabilitation Level of Care: The patient requires daily medical management by a physician with specialized training in physical medicine and rehabilitation for the following reasons: Direction of a multidisciplinary physical rehabilitation program to maximize functional independence : Yes Medical management of patient stability for increased activity during participation in an intensive rehabilitation regime.: Yes Analysis of laboratory values and/or radiology reports with any subsequent need for medication adjustment  and/or medical intervention. : Yes   I attest that I was present, lead the team conference, and concur with the assessment and plan of the team.   Pamelia Hoit 03/18/2023, 3:24 PM

## 2023-03-19 LAB — BASIC METABOLIC PANEL
Anion gap: 8 (ref 5–15)
BUN: 16 mg/dL (ref 8–23)
CO2: 23 mmol/L (ref 22–32)
Calcium: 9.1 mg/dL (ref 8.9–10.3)
Chloride: 108 mmol/L (ref 98–111)
Creatinine, Ser: 1.4 mg/dL — ABNORMAL HIGH (ref 0.61–1.24)
GFR, Estimated: 54 mL/min — ABNORMAL LOW (ref >60)
Glucose, Bld: 98 mg/dL (ref 70–99)
Potassium: 3.9 mmol/L (ref 3.5–5.1)
Sodium: 139 mmol/L (ref 135–145)

## 2023-03-19 LAB — CBC
HCT: 33.8 % — ABNORMAL LOW (ref 39.0–52.0)
Hemoglobin: 11 g/dL — ABNORMAL LOW (ref 13.0–17.0)
MCH: 28.6 pg (ref 26.0–34.0)
MCHC: 32.5 g/dL (ref 30.0–36.0)
MCV: 87.8 fL (ref 80.0–100.0)
Platelets: 281 10*3/uL (ref 150–400)
RBC: 3.85 MIL/uL — ABNORMAL LOW (ref 4.22–5.81)
RDW: 15.2 % (ref 11.5–15.5)
WBC: 5.2 10*3/uL (ref 4.0–10.5)
nRBC: 0 % (ref 0.0–0.2)

## 2023-03-19 MED ORDER — SODIUM CHLORIDE 0.9 % IV SOLN: INTRAVENOUS | Status: AC

## 2023-03-19 MED ORDER — LOSARTAN POTASSIUM 25 MG PO TABS
25.0000 mg | ORAL_TABLET | Freq: Every day | ORAL | Status: DC
  Administered 2023-03-20 – 2023-03-26 (×7): 25 mg via ORAL
  Filled 2023-03-19 (×7): qty 1

## 2023-03-19 MED ORDER — AMLODIPINE BESYLATE 5 MG PO TABS
7.5000 mg | ORAL_TABLET | Freq: Every day | ORAL | Status: DC
  Administered 2023-03-20 – 2023-03-23 (×4): 7.5 mg via ORAL
  Filled 2023-03-19 (×4): qty 1

## 2023-03-19 NOTE — Progress Notes (Signed)
 Physical Therapy Session Note  Patient Details  Name: Edward Rodgers MRN: [id number] Date of Birth: 26-Dec-1950  Today's Date: 03/19/2023 PT Individual Time: 0805-0900 and 1435-1535 PT Individual Time Calculation (min): 55 min and 60 min  Short Term Goals: Week 4:  PT Short Term Goal 1 (Week 4): pt will perform bed mobility with CGA or less consistently PT Short Term Goal 2 (Week 4): pt will perform stand pivot transfer consistently with mod A or less PT Short Term Goal 3 (Week 4): pt will ambulate with LRAD for neuromuscular reeducation  Skilled Therapeutic Interventions/Progress Updates: Pt presented in bed agreeable to therapy. Pt denies pain at rest. Session focused on bed mobility, transfers, and gait. Pt completed lateral bridges towards EOB and was able to roll to L with CGA and required minA for sit to supine with use of bed features and primarily truncal support. PTA donned socks, threaded pants, and donned shoes total A for time management. Pt required modA to doff shirt and don clean shirt. Pt then completed Sit to stand from lowered mat with modA and facilitation for increased anterior weight shifting. PTA pulled pants up with maxA and completed step transfer to w/c (to L) with modA. Pt then transported to main gym and set up for gait training. Pt ambulated 63ft and 67ft x 2 with modA and heavier modA for turns. Pt required intermittent cues for increasing abduction when stepping with LLE as well as achieving TKE in space phase of LLE. Noted increased L lateral lean with fatigue as well as difficulty initiating hip flfexion to advance LLE. Pt did require extended seated rest breaks between bouts. At end of sesison pt transported back to room and remained in w/c with belt alarm on, call bell within reach and needs met.   Tx2: Pt presented in bed with wife and brother present agreeable to therapy. Pt denies pain during session. Session focused on gait and NMR via forced use. Pt completed  supine to sit via log roll with minA. PTA donned shoes total A and pt completed Sit to stand with modA and ambulatory transfer to w/c with minA and verbal cues for sequencing. Pt transported to hallway and set up in Lite Gait. Pt then ambulated ~26ft in Lite Gait with modA. PTA providing facilitation at hips for increased weight shift to R and intermittently adjusting LLE to increase BOS as well as decreasing L lateral lean. With fatigue pt noted to have increased difficulty advancing LLE but noted initiation at hip. During seated rest PTA obtained shoe cover and pt performed an additional bout of ambulation in Lite Gait. Pt ambulated 29ft in same manner as prior however noted to consistently be able to advance LLE. During ambulation PTA providing cues for decreasing step length with RLE to allow increased efficacy of advancing LLE. PTA also providing significant facilitation on second bout of ambulation to weight shift to R. Pt noted at time to be able to perform TKE while in stance phase without verbal cues! Pt transported back to room at end of session and requesting to remain in w/c. Pt left in w/c with half lap tray in place, belt alarm on, call bell within reach and current needs met.      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 Restrictions Weight Bearing Restrictions Per Provider Order: No General:   Vital Signs:   Pain: Pain Assessment Pain Scale: 0-10 Pain Score: 0-No pain  Therapy/Group: Individual Therapy  [name] 03/19/2023, 12:56 PM

## 2023-03-19 NOTE — Plan of Care (Signed)
  Problem: Consults Goal: RH STROKE PATIENT EDUCATION Description: See Patient Education module for education specifics  Outcome: Progressing   Problem: RH BOWEL ELIMINATION Goal: RH STG MANAGE BOWEL WITH ASSISTANCE Description: STG Manage Bowel with min Assistance. Outcome: Progressing Goal: RH STG MANAGE BOWEL W/MEDICATION W/ASSISTANCE Description: STG Manage Bowel with Medication with mod I  Assistance. Outcome: Progressing   Problem: RH BLADDER ELIMINATION Goal: RH STG MANAGE BLADDER WITH ASSISTANCE Description: STG Manage Bladder With min Assistance Outcome: Progressing Goal: RH STG MANAGE BLADDER WITH MEDICATION WITH ASSISTANCE Description: STG Manage Bladder With Medication With mod I Assistance. Outcome: Progressing   Problem: RH SKIN INTEGRITY Goal: RH STG SKIN FREE OF INFECTION/BREAKDOWN Description: Manage skin w min assist Outcome: Progressing Goal: RH STG MAINTAIN SKIN INTEGRITY WITH ASSISTANCE Description: STG Maintain Skin Integrity With min Assistance. Outcome: Progressing   Problem: RH SAFETY Goal: RH STG ADHERE TO SAFETY PRECAUTIONS W/ASSISTANCE/DEVICE Description: STG Adhere to Safety Precautions With cues  Assistance/Device. Outcome: Progressing   Problem: RH COGNITION-NURSING Goal: RH STG USES MEMORY AIDS/STRATEGIES W/ASSIST TO PROBLEM SOLVE Description: STG Uses Memory Aids/Strategies With cues Assistance to Problem Solve. Outcome: Progressing   Problem: RH KNOWLEDGE DEFICIT Goal: RH STG INCREASE KNOWLEDGE OF DIABETES Description: Patient and wife will be able to manage prediabetes using educational resources for medications and dietary modification independently Outcome: Progressing Goal: RH STG INCREASE KNOWLEDGE OF HYPERTENSION Description: Patient and wife will be able to manage HTN using educational resources for medications and dietary modification independently Outcome: Progressing Goal: RH STG INCREASE KNOWLEGDE OF  HYPERLIPIDEMIA Description: Patient and wife will be able to manage HLD using educational resources for medications and dietary modification independently Outcome: Progressing Goal: 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

## 2023-03-19 NOTE — Progress Notes (Signed)
 Speech Language Pathology Daily Session Note  Patient Details  Name: Edward Rodgers MRN: [id number] Date of Birth: [birth year]  Today's Date: 03/19/2023 SLP Individual Time: 4098-1191 SLP Individual Time Calculation (min): 54 min  Short Term Goals: Week 4: SLP Short Term Goal 1 (Week 4): Pt will complete functional problem solving tasks @ 90% accuracy given modA SLP Short Term Goal 2 (Week 4): Pt will utilize external memory aids as needed to recall recent/relevant information w/ 80% accuracy given modA visual/verbal cues SLP Short Term Goal 3 (Week 4): When prompted, pt will demonstrate awareness of errors in 3/5 attempts with modA  Skilled Therapeutic Interventions: SLP conducted skilled therapy session targeting cognitive retraining goals. SLP facilitated discussion with patient's brother re: preparedness for transition home with brother indicating that they still needed to get started on items such as entry ramp and making the house wheelchair accessible. SLP encouraged quickening of this process as patient's discharge date approaches. Patient discussed anticipated challenges upon return home including getting into the house and navigating the home in the wheelchair. Patient recalled 50% of events from the morning with supervision and required mod to max assist for remaining 50%. SLP facilitated functional problem solving task via novel game with patient benefiting from mod to light max assist throughout for accurate decision making. Patient was left in chair with call bell in reach and chair alarm set. SLP will continue to target goals per plan of care.       Pain Pain Assessment Pain Scale: 0-10 Pain Score: 0-No pain  Therapy/Group: Individual Therapy  [name], M.A., CCC-SLP  [name] 03/19/2023, 12:20 PM

## 2023-03-19 NOTE — Progress Notes (Signed)
 Occupational Therapy Session Note  Patient Details  Name: Edward Rodgers MRN: [id number] Date of Birth: 01/23/1950  Today's Date: 03/19/2023 OT Individual Time: 1000-1045 OT Individual Time Calculation (min): 45 min     Skilled Therapeutic Interventions/Progress Updates: Patient received sitting up in his w/c visiting with his brother. Patient wanting to go to the bathroom before starting therapy. Using the stedy patient transferred to the toilet, Patient able to manage pants down and sit safely. Patient needed help with peri care, but able to pull pants up.  Set up for grooming at the sink. Patient rolled to therapy room for NMRE of the LUE. Transferred to the mat with Mod assist stand pivot. Able to maintain static sitting balance and kept balance through [facility] mobilization and AAROM. Continued treatment with Assisted reach using distal support cane. Patient needed cues at triceps and assist with distal control for forward reach, followed with mod assist IR/ER to keep movement controlled and through full ROM. Patient  concluded treatment by returning to w/c on the left performing a sqat pivot with moderate vc's/instruction, but only minimal physical assist. Continue with skilled OT POC to improve BADL independence and LUE neuro return.      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 Restrictions Weight Bearing Restrictions Per Provider Order: No    Pain:No report of pain   ADL: ADL Eating: Minimal assistance Where Assessed-Eating: Bed level Grooming: Minimal assistance Where Assessed-Grooming: Sitting at sink Upper Body Bathing: Moderate assistance, Minimal assistance Where Assessed-Upper Body Bathing: Sitting at sink Lower Body Bathing: Minimal assistance, Moderate assistance (Use of stedy) Where Assessed-Lower Body Bathing: Sitting at sink, Standing at sink Upper  Body Dressing: Moderate assistance Where Assessed-Upper Body Dressing: Sitting at sink Lower Body Dressing: Maximal assistance Where Assessed-Lower Body Dressing: Standing at sink (Use stedy) Toileting: Maximal assistance Where Assessed-Toileting: Toilet, Psychiatrist Transfer: Moderate assistance Toilet Transfer Method: Other (comment) [name]) Toilet Transfer Equipment: Bedside commode, Grab bars Tub/Shower Transfer: Unable to assess [employer] Transfer: Unable to assess     Therapy/Group: Individual Therapy  Warnell Forester 03/19/2023, 12:19 PM

## 2023-03-19 NOTE — Progress Notes (Signed)
 PROGRESS NOTE   Subjective/Complaints:  Working with therapy in the gym this morning.  No new concerns or complaints elicited today.  Continues to have poor p.o. intake, creatinine going up.   ROS: Patient denies CP, abdominal pain, HA, SOB, diarrhea, Joint pain Insomnia- improved    Objective:   No results found.   Recent Labs    03/19/23 0558  WBC 5.2  HGB 11.0*  HCT 33.8*  PLT 281     Recent Labs    03/18/23 0612 03/19/23 0558  NA 139 139  K 3.7 3.9  CL 104 108  CO2 25 23  GLUCOSE 102* 98  BUN 11 16  CREATININE 1.27* 1.40*  CALCIUM 9.5 9.1        Intake/Output Summary (Last 24 hours) at 03/19/2023 1214 Last data filed at 03/19/2023 0700 Gross per 24 hour  Intake 474 ml  Output --  Net 474 ml         Physical Exam: Vital Signs Blood pressure (!) 146/95, pulse 84, temperature 97.9 F (36.6 C), resp. rate 18, height 5\' 9"  (1.753 m), weight 90.3 kg, SpO2 96%.  Constitutional: No distress . Vital signs reviewed.  Working with therapy in the gym HEENT: NCAT, EOMI, oral membranes moist Neck: supple Cardiovascular: RRR Respiratory/Chest: CTA Bilaterally, non-labored  GI/Abdomen: BS +, non-tender, non-distended Ext: no clubbing or cyanosis, very mild LUE edema but overall improving Psych: pleasant and cooperative Skin: No breakdown noted on visible portion  Neuro:     He is awake and alert.   Oriented to month, day, hospital, self.  Mildly slow to process,   Language overall appears to be intact.  Left central 7-minimal,  Decreased sensation to light touch in left upper extremity and left lower extremity.  RUE and RLE 4+/5 LLE 4/5 LUE shoulder abduction 2-3/5, distally 3 to 4-/5 Mildly Increased tone L hand -overall stable 3/6   Assessment/Plan: 1. Functional deficits which require 3+ hours per day of interdisciplinary therapy in a comprehensive inpatient rehab setting. Physiatrist is  providing close team supervision and 24 hour management of active medical problems listed below. Physiatrist and rehab team continue to assess barriers to discharge/monitor patient progress toward functional and medical goals  Care Tool:  Bathing    Body parts bathed by patient: Left arm, Chest, Abdomen, Front perineal area, Right upper leg, Left upper leg, Right lower leg, Face   Body parts bathed by helper: Right arm, Buttocks, Left lower leg     Bathing assist Assist Level: Moderate Assistance - Patient 50 - 74%     Upper Body Dressing/Undressing Upper body dressing        Upper body assist Assist Level: Moderate Assistance - Patient 50 - 74%    Lower Body Dressing/Undressing Lower body dressing            Lower body assist Assist for lower body dressing: Maximal Assistance - Patient 25 - 49%     Toileting Toileting    Toileting assist Assist for toileting: Maximal Assistance - Patient 25 - 49%     Transfers Chair/bed transfer  Transfers assist     Chair/bed transfer assist level: Minimal Assistance - Patient >  75%     Locomotion Ambulation   Ambulation assist   Ambulation activity did not occur: Safety/medical concerns  Assist level: 2 helpers (+2 for WC follow) Assistive device: Walker-rolling Max distance: 50 feet   Walk 10 feet activity   Assist  Walk 10 feet activity did not occur: Safety/medical concerns  Assist level: 2 helpers Assistive device: Walker-rolling   Walk 50 feet activity   Assist Walk 50 feet with 2 turns activity did not occur: Safety/medical concerns  Assist level: 2 helpers Assistive device: Walker-rolling    Walk 150 feet activity   Assist Walk 150 feet activity did not occur: Safety/medical concerns         Walk 10 feet on uneven surface  activity   Assist Walk 10 feet on uneven surfaces activity did not occur: Safety/medical concerns         Wheelchair     Assist Is the patient using a  wheelchair?: Yes Type of Wheelchair: Manual    Wheelchair assist level: Minimal Assistance - Patient > 75% Max wheelchair distance: 50    Wheelchair 50 feet with 2 turns activity    Assist        Assist Level: Minimal Assistance - Patient > 75%   Wheelchair 150 feet activity     Assist      Assist Level: Maximal Assistance - Patient 25 - 49%   Blood pressure (!) 146/95, pulse 84, temperature 97.9 F (36.6 C), resp. rate 18, height 5\' 9"  (1.753 m), weight 90.3 kg, SpO2 96%.  Medical Problem List and Plan: 1. 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  Await plan for loop recorder             -patient may shower -ELOS/Goals: 21-25 days, min assist with PT, OT and supervision with SLP -Continue CIR therapies including PT, OT, and SLP  -Discharge date adjusted to 3/15 -grounds pass -Left WHO ordered -Continue plan for DC 3/15    2.  Antithrombotics: -DVT/anticoagulation:  Pharmaceutical: eliquis per pharmacy -03/07/23 now on eliquis per pharmacy -antiplatelet therapy: Aspirin 81 mg daily and Plavix 75 mg day x 3 weeks then Plavix alone -2/20 DC aspirin and Plavix as on heparin, discussed with neurology and cardiology-- 2/22 now on eliquis  3. Pain Management: Lidoderm patch as directed, Tylenol as needed 4. Mood/Sleep/sundowning: Effexor 150 mg every evening, provide emotional support             -antipsychotic agents: Seroquel 50 mg nighty at 2030, have back up dose             -keep sleep chart 5. Neuropsych/cognition: This patient is capable of making decisions on his own behalf. 6. Skin/Wound Care: Routine skin checks 7. Fluids/Electrolytes/Nutrition: Routine in and outs with follow-up chemistries, continue vitamins/supplements 8.  Hypothyroidism.  Synthroid 73mcg/day 9.  Hypertension. Norvasc 10 mg daily, Toprol-Xl 100 mg daily, Aldactone 25 mg daily.             -bp trending up.  Likely will need additions to regimen  -02/22/23 BPs 140s/90s, not terrible, monitor trend with increase mobility  2/10 BP controlled, decrease spironolactone due to elevated CR/BUN -02/28/23 BP stable but creeping up, continue to monitor for now but may need to increase meds back -2/25 BP stable, hold losartan for now due to renal function -2/28 BP a little higher, restart low dose losartan 25mg  daily -3/3 increase losartan to 50 mg daily -3/4  Restart norvasc at lower dose 2.5mg  -3/5 increase norvasc to 5mg  daily starting tomorrow  -3/6 Decrease losartan to 25mg /increase norvasc to 7.5mg  for tomorrow   Vitals:   03/16/23 0441 03/16/23 1343 03/16/23 1910 03/16/23 2100  BP: (!) 170/94 (!) 154/89 (!) 189/104 (!) 150/90   03/17/23 0516 03/17/23 1122 03/17/23 1300 03/17/23 1935  BP: (!) 173/86 (!) 139/99 130/72 (!) 150/91   03/17/23 1953 03/18/23 0453 03/18/23 1334 03/18/23 1917  BP: (!) 157/93 (!) 172/98 (!) 152/89 (!) 146/95    10.H/O prostate cancer. Myrbetriq 50 mg daily.  -3/5 occasional incontinence but improving 11.Hyperlipidemia. Lipitor 80mg  daily 12.Constipation.             -miralax daily, SenokotS 1 tab daily -02/22/23 LBM 2days ago, monitor today, if no BM tomorrow then may want to increase regimen -2/25 LBM 2/23, consider addtional medication if no bowel movement today -3/6 LBM yesterday, improved 13.H/O gout. Zyloprim 300mg  daily 14.  AKI versus CKD  -Encourage oral fluid intake, decrease spironolactone to 12.5 mg  -2/11 recheck BMP today  Addendum- increased Cr, stop spironolactone, start IVF NS 31ml/HR  -2/12 Cr slightly improved, continue IVF, encourage oral fluids  -2/13 recheck BMP- CR down to 1.2  -2/14 DC IVF, discussed oral hydration  2/17 BUN/CR stable 17/1.25  -2/18 continue to encourage oral hydration -2/20 BUN and creatinine improved to 21/1.25, got IV fluids last night 22ml/hr   2/21 Encourage PO fluid intake  -03/07/23 Cr 0.99 today, poor PO intake, a little  dizzy this morning, will give IVFs @50ml /hr x12hrs now, and have standing PRN order for 93ml/hr x12hrs at bedtime if PO intake <1287ml/day -03/08/23 doing much better today after IVFs; leave standing order on for now, but encouraged pt to drink water throughout the day  2/24---give additional IVF today as Cr sl up 2/25 hold losartan, will hold off on IVF for now and encourage oral hydration.  Caution with IVF as x-ray chest with increased pulmonary congestion 2/27 50 mL/h IVF, gentle rate due to CHF history, started yesterday, continue today, creatinine slightly improved 2/28 Change IVF NS with potassium  to at night only, Cr and Bun improved. Can hold if drinking > fluids that day -03/14/23 doing well, monitor, hopefully can avoid using IVFs tonight 3/3 improved Cr/BUN continue to monitor  Will recheck BMP tomorrow make sure he is doing okay off IVF, encourage oral fluids 3/5 BUN/Cr a little higher, continue to encourage oral fluid intake.  Patient has set alarm to drink fluids 3/6 Cr/Bun continues to trend up, now at 1.4/16, restart nighttime IVF 33ml/hr 15.  History of CHF.  Last EF 55%, mild diastolic dysfunction  -Continue metoprolol, decrease spironolactone to 12.5 mg as above -2/14 Wts stable, has not appear to show signs of fluid overload, continue to monitor -02/28/23 wt up but doubt accuracy, monitor, no overload on exam -2/19-20 weight stable, does not appear to show signs of fluid overload, EF 40 to 45% on echo. -Cardiology has been following, they discontinued amlodipine and started losartan 25 mg daily-beginning tomorrow-- signing off 2/22 (see recs below)  -Follow-up with cardiology at the [facility] post[facility]charge  -2/27 weight is overall stable, continue to monitor  -2/28 restart losartan  -3/4-5 weight trending a little up, no signs of fluid overload at this time  3/6 no signs of fluid overload Filed Weights   03/16/23 0546 03/17/23 0516 03/18/23 0500  Weight: 88.8 kg 89.5 kg 90.3  kg    16.  Severe cervical stenosis:  Consider neurosurgery outpatient follow-up 17. Insomnia -Patient has Seroquel 50 mg scheduled and 25 mg as needed.  Will also add in 3 mg melatonin at bedtime  -2/11 increase Seroquel to 75 mg nightly  -2/13 asked nursing to complete sleep-wake chart  -2/14 reports sleeping better, continue current regimen for now and monitor  -2/20 will decrease Seroquel back to 50 mg nightly and 25 mg at bedtime  2/21 Decrease seroquel to 25mg  nightly and 25mg  PRN  2/25 increase Seroquel scheduled back to 50 nightly and 25 as needed  -2/28-3/6 reports he continues to sleep well, continue current regimen  18.  Bleeding from his penis  -UA neg,HGB was stable on 2/11 -Denies additional occurrences.  During team meeting was noted this happened intermittently during acute hospital stay  19. Nasal congestion: Flonase 1 spray daily  20. Staring episodes/vision changes 03/01/23 -03/01/23 having staring episodes and ?chronic vision changes/hemianopsia? Spoke with Dr. Derry Lory of neuro, will get 24hr EEG and head CT, no code stroke at this point; they will see pt, appreciate their assistance.  -2/17 overnight EEG neg, CT head without acute changes  21. Chest pain  -EKG, Trop, CXR ordered -Cardiology following appreciate assistance.  D-dimer positive, getting CT PE study to rule out PE.  If positive will need to discuss with neurology regarding anticoagulation.  He is now on 2 L nasal cannula oxygen  -2/20 patient reports his chest pain is resolved  22. Pulmonary embolism / DVT left popliteal vein -2/19 discussed with neurology and cardiology. Heparin grip started, wife updated by phone. Critical care medicine consulted 2/20 seen by critical care, continue heparin eventual transition to DOAC, if hemodynamic changes contact PCCM again. Advised 24h bedrest with [facility] 2/21 plan to transition to DOAC later today, discussed with pharmacy by phone  -03/07/23 cardiology saw pt,  recommend eliquis per protolocol, continue toprol/low dose losartan, and they signed off-- f/up in 3-4wks with Dr. Odis Hollingshead  23. Leukocytosis: 13k today -SLP felt that he was having more difficulties from a cognitive standpoint today  -he is afebrile. No cough or respiratory sx  -will check UA, UCX- UA does not indicate infection  -recheck CBC in AM  -CXR yesterday with pulmonary vascular congestion  -WBC WNL 3/3  24. Mild hyponatremia, mild continue to monitor   -3/6 stable at 136  LOS: 26 days A FACE TO FACE EVALUATION WAS PERFORMED  Fanny Dance 03/19/2023, 12:14 PM

## 2023-03-20 NOTE — Progress Notes (Addendum)
 PROGRESS NOTE   Subjective/Complaints:  Working with therapy this morning.  No new complaints or concerns.  Will continue to encourage p.o. intake.   ROS: Patient denies fever, chills, CP, abdominal pain, HA, SOB, diarrhea, dizziness Insomnia- improved    Objective:   No results found.   Recent Labs    03/19/23 0558  WBC 5.2  HGB 11.0*  HCT 33.8*  PLT 281     Recent Labs    03/18/23 0612 03/19/23 0558  NA 139 139  K 3.7 3.9  CL 104 108  CO2 25 23  GLUCOSE 102* 98  BUN 11 16  CREATININE 1.27* 1.40*  CALCIUM 9.5 9.1        Intake/Output Summary (Last 24 hours) at 03/20/2023 1154 Last data filed at 03/19/2023 1800 Gross per 24 hour  Intake 1318 ml  Output --  Net 1318 ml         Physical Exam: Vital Signs Blood pressure (!) 152/98, pulse 73, temperature 98.1 F (36.7 C), temperature source Oral, resp. rate 18, height 5\' 9"  (1.753 m), weight 88.9 kg, SpO2 98%.  Constitutional: No distress . Vital signs reviewed.  Working with therapy in the gym HEENT: NCAT, EOMI, oral membranes moist Neck: supple Cardiovascular: RRR Respiratory/Chest: CTA Bilaterally, non-labored  GI/Abdomen: BS +, non-tender, non-distended Ext: no clubbing or cyanosis, very mild LUE edema but overall improving Psych: pleasant and cooperative Skin: No breakdown noted on visible portion  Neuro:     He is awake and alert.   Oriented to month, day, hospital, self.  Mildly slow to process,   Language overall appears to be intact.  Decreased sensation to light touch in left upper extremity and left lower extremity.  RUE and RLE 4+/5 LLE 4/5 LUE shoulder abduction 3/5, distally 3 to 4-/5 Mildly Increased tone L hand -overall stable 3/7   Assessment/Plan: 1. Functional deficits which require 3+ hours per day of interdisciplinary therapy in a comprehensive inpatient rehab setting. Physiatrist is providing close team supervision and  24 hour management of active medical problems listed below. Physiatrist and rehab team continue to assess barriers to discharge/monitor patient progress toward functional and medical goals  Care Tool:  Bathing    Body parts bathed by patient: Left arm, Chest, Abdomen, Front perineal area, Right upper leg, Left upper leg, Right lower leg, Face   Body parts bathed by helper: Right arm, Buttocks, Left lower leg     Bathing assist Assist Level: Moderate Assistance - Patient 50 - 74%     Upper Body Dressing/Undressing Upper body dressing        Upper body assist Assist Level: Moderate Assistance - Patient 50 - 74%    Lower Body Dressing/Undressing Lower body dressing            Lower body assist Assist for lower body dressing: Maximal Assistance - Patient 25 - 49%     Toileting Toileting    Toileting assist Assist for toileting: Moderate Assistance - Patient 50 - 74%     Transfers Chair/bed transfer  Transfers assist     Chair/bed transfer assist level: Minimal Assistance - Patient > 75%     Locomotion Ambulation  Ambulation assist   Ambulation activity did not occur: Safety/medical concerns  Assist level: 2 helpers (+2 for WC follow) Assistive device: Walker-rolling Max distance: 50 feet   Walk 10 feet activity   Assist  Walk 10 feet activity did not occur: Safety/medical concerns  Assist level: 2 helpers Assistive device: Walker-rolling   Walk 50 feet activity   Assist Walk 50 feet with 2 turns activity did not occur: Safety/medical concerns  Assist level: 2 helpers Assistive device: Walker-rolling    Walk 150 feet activity   Assist Walk 150 feet activity did not occur: Safety/medical concerns         Walk 10 feet on uneven surface  activity   Assist Walk 10 feet on uneven surfaces activity did not occur: Safety/medical concerns         Wheelchair     Assist Is the patient using a wheelchair?: Yes Type of Wheelchair:  Manual    Wheelchair assist level: Minimal Assistance - Patient > 75% Max wheelchair distance: 50    Wheelchair 50 feet with 2 turns activity    Assist        Assist Level: Minimal Assistance - Patient > 75%   Wheelchair 150 feet activity     Assist      Assist Level: Maximal Assistance - Patient 25 - 49%   Blood pressure (!) 152/98, pulse 73, temperature 98.1 F (36.7 C), temperature source Oral, resp. rate 18, height 5\' 9"  (1.753 m), weight 88.9 kg, SpO2 98%.  Medical Problem List and Plan: 1. 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  Await plan for loop recorder             -patient may shower -ELOS/Goals: 21-25 days, min assist with PT, OT and supervision with SLP -Continue CIR therapies including PT, OT, and SLP  -Discharge date adjusted to 3/15 -grounds pass -Left WHO ordered -Continue plan for DC 3/15    2.  Antithrombotics: -DVT/anticoagulation:  Pharmaceutical: eliquis per pharmacy -03/07/23 now on eliquis per pharmacy -antiplatelet therapy: Aspirin 81 mg daily and Plavix 75 mg day x 3 weeks then Plavix alone -2/20 DC aspirin and Plavix as on heparin, discussed with neurology and cardiology-- 2/22 now on eliquis  3. Pain Management: Lidoderm patch as directed, Tylenol as needed 4. Mood/Sleep/sundowning: Effexor 150 mg every evening, provide emotional support             -antipsychotic agents: Seroquel 50 mg nighty at 2030, have back up dose             -keep sleep chart 5. Neuropsych/cognition: This patient is capable of making decisions on his own behalf. 6. Skin/Wound Care: Routine skin checks 7. Fluids/Electrolytes/Nutrition: Routine in and outs with follow-up chemistries, continue vitamins/supplements 8.  Hypothyroidism.  Synthroid 34mcg/day 9.  Hypertension. Norvasc 10 mg daily, Toprol-Xl 100 mg daily, Aldactone 25 mg daily.             -bp trending up. Likely will  need additions to regimen  -02/22/23 BPs 140s/90s, not terrible, monitor trend with increase mobility  2/10 BP controlled, decrease spironolactone due to elevated CR/BUN -02/28/23 BP stable but creeping up, continue to monitor for now but may need to increase meds back -2/25 BP stable, hold losartan for now due to renal function -2/28 BP a little higher, restart low dose losartan 25mg  daily -3/3 increase losartan to 50 mg daily -3/4 Restart norvasc at lower dose 2.5mg  -  3/5 increase norvasc to 5mg  daily starting tomorrow  -3/6 Decrease losartan to 25mg /increase norvasc to 7.5mg  for tomorrow -3/7 BP improved, continue current regimen   Vitals:   03/16/23 2100 03/17/23 0516 03/17/23 1122 03/17/23 1300  BP: (!) 150/90 (!) 173/86 (!) 139/99 130/72   03/17/23 1935 03/17/23 1953 03/18/23 0453 03/18/23 1334  BP: (!) 150/91 (!) 157/93 (!) 172/98 (!) 152/89   03/18/23 1917 03/19/23 1400 03/19/23 1938 03/20/23 0520  BP: (!) 146/95 120/75 (!) 144/93 (!) 152/98    10.H/O prostate cancer. Myrbetriq 50 mg daily.  -3/5 occasional incontinence but improving 11.Hyperlipidemia. Lipitor 80mg  daily 12.Constipation.             -miralax daily, SenokotS 1 tab daily -02/22/23 LBM 2days ago, monitor today, if no BM tomorrow then may want to increase regimen -2/25 LBM 2/23, consider addtional medication if no bowel movement today -3/7 LBM today, improved 13.H/O gout. Zyloprim 300mg  daily 14.  AKI versus CKD  -Encourage oral fluid intake, decrease spironolactone to 12.5 mg  -2/11 recheck BMP today  Addendum- increased Cr, stop spironolactone, start IVF NS 51ml/HR  -2/12 Cr slightly improved, continue IVF, encourage oral fluids  -2/13 recheck BMP- CR down to 1.2  -2/14 DC IVF, discussed oral hydration  2/17 BUN/CR stable 17/1.25  -2/18 continue to encourage oral hydration -2/20 BUN and creatinine improved to 21/1.25, got IV fluids last night 36ml/hr   2/21 Encourage PO fluid intake  -03/07/23 Cr 0.99 today,  poor PO intake, a little dizzy this morning, will give IVFs @50ml /hr x12hrs now, and have standing PRN order for 30ml/hr x12hrs at bedtime if PO intake <1274ml/day -03/08/23 doing much better today after IVFs; leave standing order on for now, but encouraged pt to drink water throughout the day  2/24---give additional IVF today as Cr sl up 2/25 hold losartan, will hold off on IVF for now and encourage oral hydration.  Caution with IVF as x-ray chest with increased pulmonary congestion 2/27 50 mL/h IVF, gentle rate due to CHF history, started yesterday, continue today, creatinine slightly improved 2/28 Change IVF NS with potassium  to at night only, Cr and Bun improved. Can hold if drinking > fluids that day -03/14/23 doing well, monitor, hopefully can avoid using IVFs tonight 3/3 improved Cr/BUN continue to monitor  Will recheck BMP tomorrow make sure he is doing okay off IVF, encourage oral fluids 3/5 BUN/Cr a little higher, continue to encourage oral fluid intake.  Patient has set alarm to drink fluids 3/6 Cr/Bun continues to trend up, now at 1.4/16, give IVF tonight 3/7 Continue to encourage PO intake, recheck labs tomorrow- Could consider restart nighttime IVF if not trending down 15.  History of CHF.  Last EF 55%, mild diastolic dysfunction  -Continue metoprolol, decrease spironolactone to 12.5 mg as above -2/14 Wts stable, has not appear to show signs of fluid overload, continue to monitor -02/28/23 wt up but doubt accuracy, monitor, no overload on exam -2/19-20 weight stable, does not appear to show signs of fluid overload, EF 40 to 45% on echo. -Cardiology has been following, they discontinued amlodipine and started losartan 25 mg daily-beginning tomorrow-- signing off 2/22 (see recs below)  -Follow-up with [facility]ology at the [facility] post[facility]charge  -2/27 weight is overall stable, continue to monitor  -2/28 restart losartan  -3/4-5 weight trending a little up, no signs of fluid overload at  this time  3/6 no signs of fluid overload Filed Weights   03/18/23 0500 03/19/23 1216 03/20/23 0500  Weight: 90.3 kg 89.4 kg 88.9 kg    16.  Severe cervical stenosis: Consider neurosurgery outpatient follow-up 17. Insomnia -Patient has Seroquel 50 mg scheduled and 25 mg as needed.  Will also add in 3 mg melatonin at bedtime  -2/11 increase Seroquel to 75 mg nightly  -2/13 asked nursing to complete sleep-wake chart  -2/14 reports sleeping better, continue current regimen for now and monitor  -2/20 will decrease Seroquel back to 50 mg nightly and 25 mg at bedtime  2/21 Decrease seroquel to 25mg  nightly and 25mg  PRN  2/25 increase Seroquel scheduled back to 50 nightly and 25 as needed  -2/28-3/7 reports he continues to sleep well, continue current regimen  18.  Bleeding from his penis  -UA neg,HGB was stable on 2/11 -Denies additional occurrences.  During team meeting was noted this happened intermittently during acute hospital stay  19. Nasal congestion: Flonase 1 spray daily  20. Staring episodes/vision changes 03/01/23 -03/01/23 having staring episodes and ?chronic vision changes/hemianopsia? Spoke with Dr. Derry Lory of neuro, will get 24hr EEG and head CT, no code stroke at this point; they will see pt, appreciate their assistance.  -2/17 overnight EEG neg, CT head without acute changes  21. Chest pain  -EKG, Trop, CXR ordered -Cardiology following appreciate assistance.  D-dimer positive, getting CT PE study to rule out PE.  If positive will need to discuss with neurology regarding anticoagulation.  He is now on 2 L nasal cannula oxygen  -2/20 patient reports his chest pain is resolved  22. Pulmonary embolism / DVT left popliteal vein -2/19 discussed with neurology and cardiology. Heparin grip started, wife updated by phone. Critical care medicine consulted 2/20 seen by critical care, continue heparin eventual transition to DOAC, if hemodynamic changes contact PCCM again. Advised  24h bedrest with [facility] 2/21 plan to transition to DOAC later today, discussed with pharmacy by phone  -03/07/23 cardiology saw pt, recommend eliquis per protolocol, continue toprol/low dose losartan, and they signed off-- f/up in 3-4wks with Dr. Odis Hollingshead  23. Leukocytosis: 13k today -SLP felt that he was having more difficulties from a cognitive standpoint today  -he is afebrile. No cough or respiratory sx  -will check UA, UCX- UA does not indicate infection  -recheck CBC in AM  -CXR yesterday with pulmonary vascular congestion  -WBC WNL 3/3  24. Mild hyponatremia, mild continue to monitor   -3/6 stable at 136  LOS: 27 days A FACE TO FACE EVALUATION WAS PERFORMED  Fanny Dance 03/20/2023, 11:54 AM

## 2023-03-20 NOTE — Progress Notes (Signed)
 Occupational Therapy Session Note  Patient Details  Name: Edward Rodgers MRN: [id number] Date of Birth: [birth year]  Today's Date: 03/20/2023 OT Individual Time: 9528-4132 OT Individual Time Calculation (min): 78 min    Short Term Goals: Week 3:  OT Short Term Goal 1 (Week 3): STGs=LTGs due to patient's estimated length of stay. OT Short Term Goal 1 - Progress (Week 3): Progressing toward goal  Skilled Therapeutic Interventions/Progress Updates:  Pt greeted supine in bed, pt attempting to use urinal unsuccessfully,  pt agreeable to OT intervention.      Transfers/bed mobility/functional mobility: pt completed supine>sit with MODA needing assist to manage LLE and elevate trunk into sitting. Pt completed squat pivot to w/c to L side with MIN A +2 for safety.   ADLs:  LB dressing: pt donned pants/brief from EOB with MAX A with pt needing assist to thread pants/brief and pull to waist line, MODA to stand from EOB with LLE blocked and LUE supported  Footwear: donned socks with total A for time mgmt  Toileting: set- up assist  to manage urinal from EOB  NMR: pt completed various tasks focused on LUE coordination and weightbearing tasks to facilitate improved motor planning and strength in LUE: -Pt completed seated Functional reaching with pegs with a focus on composite digit flexion/extension and bimanual tasks with RUE stabilizing peg board. Pt completed task with supervision but MIN cues for technique -Grasp and release with wash cloths to challenge digit flexion/extension and provide NMR to LUE.  - pt instructed on beading task with pt instructed to hold string in RUE and grasp beads with LUE to then be placed on string, pt required step by step cues to motor plan grasping task, pt presents with gross grasp with LUE  -pt attempted grasping clothespins with LUE however pt needed hand over hand assist with L elbow supported on table   Handed off pt to NT as needed to toilet                 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 Restrictions Weight Bearing Restrictions Per Provider Order: No  Pain: No pain reported during session    Therapy/Group: Individual Therapy  Pollyann Glen [facility] 03/20/2023, 12:21 PM

## 2023-03-20 NOTE — Progress Notes (Signed)
 Physical Therapy Session Note  Patient Details  Name: Edward Rodgers MRN: [id number] Date of Birth: 06/25/1950  Today's Date: 03/20/2023 PT Individual Time: 1100-1153 PT Individual Time Calculation (min): 53 min   Short Term Goals: Week 4:  PT Short Term Goal 1 (Week 4): pt will perform bed mobility with CGA or less consistently PT Short Term Goal 2 (Week 4): pt will perform stand pivot transfer consistently with mod A or less PT Short Term Goal 3 (Week 4): pt will ambulate with LRAD for neuromuscular reeducation  Skilled Therapeutic Interventions/Progress Updates:     Pt received seated in [facility] and agrees to therapy. No complaint of pain. PT assists to don shoes prior to mobility. WC transport to gym for time management. Pt stands with minA/modA and no AD, with cues for anterior weight shifting and initiation. Pt performs x10 minisquats in standing for NMR and loading through LLE. Seated rest break. Pt performs sit to stand with minA and same cues, then ambulates x75' with modA +2  with 3 musketeer technique, with cueing for reciprocal gait pattern, lateral weight shifting, tactile cueing and manual facilitation of Lt hip abductor engagement during stance phase, and safe sequencing during turns. Pt initially requires assistance progressing LLE during swing phase, but improves to completing with verbal cues for "big kick" and no external assistance required. Following rest break, pt completes x10 foot taps on 6" step with RLE, with modA and facilitation of trunk stability and Lt lateral weight shifting. Following rest break, pt completes additional bout with similar assistance and cues, and also complete x5 foot taps with LLE to promote hip flexor activation and coordination training. Pt verbalizes need to use toilet. WC transport back to room. Stand step transfer to toilet with modA and cues for positioning. Pt left seated on toilet with NT aware and call bell in reach.   Therapy  Documentation Precautions:  Precautions Precautions: Fall Precaution/Restrictions Comments: L-hemi, L-inattention, pusher syndrome, Loop recorder placed 2/7 Restrictions Weight Bearing Restrictions Per Provider Order: No   Therapy/Group: Individual Therapy  Beau Fanny, PT, DPT 03/20/2023, 4:04 PM

## 2023-03-20 NOTE — Progress Notes (Signed)
 Speech Language Pathology Daily Session Note  Patient Details  Name: Edward Rodgers MRN: [id number] Date of Birth: [birth year]  Today's Date: 03/20/2023 SLP Individual Time: 1345-1445 SLP Individual Time Calculation (min): 60 min  Short Term Goals: Week 4: SLP Short Term Goal 1 (Week 4): Pt will complete functional problem solving tasks @ 90% accuracy given modA SLP Short Term Goal 2 (Week 4): Pt will utilize external memory aids as needed to recall recent/relevant information w/ 80% accuracy given modA visual/verbal cues SLP Short Term Goal 3 (Week 4): When prompted, pt will demonstrate awareness of errors in 3/5 attempts with modA  Skilled Therapeutic Interventions:   Pt greeted in his room. He was awake/alert in his wheelchair upon SLP arrival. He was very pleasant throughout tx tasks targeting cognition. He benefited from s visual/verbal cues for problem solving to navigate his phone and stop the video he was watching. Additionally, he benefited from minA visual/verbal cues and additional time to use his computer and find a project he was previously working on. SLP also facilitated written organization task with 4 categories. He required additional time for processing, but was able to complete the task [facility]. He also benefited from additional time during time management task w/ clocks provided, however, minA cues were required for calculations and information processing. Pt noted to acknowledge the challenge of cognitive tasks provided today, stating "this was very informative for me, thank you." He was left in his chair with the alarm set and call light within reach. Recommend cont ST per POC.   Pain  No pain reported.   Therapy/Group: Individual Therapy  [name] 03/20/2023, 7:06 AM

## 2023-03-21 LAB — BASIC METABOLIC PANEL
Anion gap: 7 (ref 5–15)
BUN: 14 mg/dL (ref 8–23)
CO2: 25 mmol/L (ref 22–32)
Calcium: 9.2 mg/dL (ref 8.9–10.3)
Chloride: 107 mmol/L (ref 98–111)
Creatinine, Ser: 1.19 mg/dL (ref 0.61–1.24)
GFR, Estimated: >60 mL/min (ref >60)
Glucose, Bld: 95 mg/dL (ref 70–99)
Potassium: 3.6 mmol/L (ref 3.5–5.1)
Sodium: 139 mmol/L (ref 135–145)

## 2023-03-21 NOTE — Progress Notes (Signed)
 PROGRESS NOTE   Subjective/Complaints:  Doing well today, slept well, denies pain, LBM yesterday and again this morning, urinating well, denies any other complaints or concerns today.    ROS: Patient denies fever, chills, CP, abdominal pain, HA, SOB, diarrhea, dizziness Insomnia- improved    Objective:   No results found.   Recent Labs    03/19/23 0558  WBC 5.2  HGB 11.0*  HCT 33.8*  PLT 281     Recent Labs    03/19/23 0558 03/21/23 0856  NA 139 139  K 3.9 3.6  CL 108 107  CO2 23 25  GLUCOSE 98 95  BUN 16 14  CREATININE 1.40* 1.19  CALCIUM 9.1 9.2        Intake/Output Summary (Last 24 hours) at 03/21/2023 1131 Last data filed at 03/21/2023 0533 Gross per 24 hour  Intake 240 ml  Output 125 ml  Net 115 ml         Physical Exam: Vital Signs Blood pressure (!) 144/90, pulse 75, temperature 98.3 F (36.8 C), temperature source Oral, resp. rate 18, height 5\' 9"  (1.753 m), weight 88.7 kg, SpO2 95%.  Constitutional: No distress . Vital signs reviewed.  Working with therapy in the gym HEENT: NCAT, EOMI, oral membranes moist Neck: supple Cardiovascular: RRR, no m/r/g appreciated Respiratory/Chest: CTA Bilaterally, non-labored  GI/Abdomen: BS +, non-tender, non-distended, soft Ext: no clubbing or cyanosis, very mild LUE edema but overall improving Psych: pleasant and cooperative Skin: No breakdown noted on visible portion  PRIOR EXAMS: Neuro:     He is awake and alert.   Oriented to month, day, hospital, self.  Mildly slow to process,   Language overall appears to be intact.  Decreased sensation to light touch in left upper extremity and left lower extremity.  RUE and RLE 4+/5 LLE 4/5 LUE shoulder abduction 3/5, distally 3 to 4-/5 Mildly Increased tone L hand -overall stable 3/7   Assessment/Plan: 1. Functional deficits which require 3+ hours per day of interdisciplinary therapy in a comprehensive  inpatient rehab setting. Physiatrist is providing close team supervision and 24 hour management of active medical problems listed below. Physiatrist and rehab team continue to assess barriers to discharge/monitor patient progress toward functional and medical goals  Care Tool:  Bathing    Body parts bathed by patient: Left arm, Chest, Abdomen, Front perineal area, Right upper leg, Left upper leg, Right lower leg, Face   Body parts bathed by helper: Right arm, Buttocks, Left lower leg     Bathing assist Assist Level: Moderate Assistance - Patient 50 - 74%     Upper Body Dressing/Undressing Upper body dressing        Upper body assist Assist Level: Moderate Assistance - Patient 50 - 74%    Lower Body Dressing/Undressing Lower body dressing            Lower body assist Assist for lower body dressing: Maximal Assistance - Patient 25 - 49%     Toileting Toileting    Toileting assist Assist for toileting: Moderate Assistance - Patient 50 - 74%     Transfers Chair/bed transfer  Transfers assist     Chair/bed transfer assist level:  Minimal Assistance - Patient > 75%     Locomotion Ambulation   Ambulation assist   Ambulation activity did not occur: Safety/medical concerns  Assist level: 2 helpers (+2 for WC follow) Assistive device: Walker-rolling Max distance: 50 feet   Walk 10 feet activity   Assist  Walk 10 feet activity did not occur: Safety/medical concerns  Assist level: 2 helpers Assistive device: Walker-rolling   Walk 50 feet activity   Assist Walk 50 feet with 2 turns activity did not occur: Safety/medical concerns  Assist level: 2 helpers Assistive device: Walker-rolling    Walk 150 feet activity   Assist Walk 150 feet activity did not occur: Safety/medical concerns         Walk 10 feet on uneven surface  activity   Assist Walk 10 feet on uneven surfaces activity did not occur: Safety/medical concerns          Wheelchair     Assist Is the patient using a wheelchair?: Yes Type of Wheelchair: Manual    Wheelchair assist level: Minimal Assistance - Patient > 75% Max wheelchair distance: 50    Wheelchair 50 feet with 2 turns activity    Assist        Assist Level: Minimal Assistance - Patient > 75%   Wheelchair 150 feet activity     Assist      Assist Level: Maximal Assistance - Patient 25 - 49%   Blood pressure (!) 144/90, pulse 75, temperature 98.3 F (36.8 C), temperature source Oral, resp. rate 18, height 5\' 9"  (1.753 m), weight 88.7 kg, SpO2 95%.  Medical Problem List and Plan: 1. 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  Await plan for loop recorder             -patient may shower -ELOS/Goals: 21-25 days, min assist with PT, OT and supervision with SLP -Continue CIR therapies including PT, OT, and SLP  -Discharge date adjusted to 3/15 -grounds pass -Left WHO ordered -Continue plan for DC 3/15    2.  Antithrombotics: -DVT/anticoagulation:  Pharmaceutical: eliquis per pharmacy -03/07/23 now on eliquis per pharmacy -antiplatelet therapy: Aspirin 81 mg daily and Plavix 75 mg day x 3 weeks then Plavix alone -2/20 DC aspirin and Plavix as on heparin, discussed with neurology and cardiology-- 2/22 now on eliquis  3. Pain Management: Lidoderm patch as directed, Tylenol as needed 4. Mood/Sleep/sundowning: Effexor 150 mg every evening, provide emotional support             -antipsychotic agents: Seroquel 50 mg nighty at 2030, have back up dose             -keep sleep chart 5. Neuropsych/cognition: This patient is capable of making decisions on his own behalf. 6. Skin/Wound Care: Routine skin checks 7. Fluids/Electrolytes/Nutrition: Routine in and outs with follow-up chemistries, continue vitamins/supplements 8.  Hypothyroidism.  Synthroid 47mcg/day 9.  Hypertension. Norvasc 10 mg  daily, Toprol-Xl 100 mg daily, Aldactone 25 mg daily.             -bp trending up. Likely will need additions to regimen  -02/22/23 BPs 140s/90s, not terrible, monitor trend with increase mobility  2/10 BP controlled, decrease spironolactone due to elevated CR/BUN -02/28/23 BP stable but creeping up, continue to monitor for now but may need to increase meds back -2/25 BP stable, hold losartan for now due to renal function -2/28 BP a little higher, restart low dose losartan 25mg  daily -  3/3 increase losartan to 50 mg daily -3/4 Restart norvasc at lower dose 2.5mg  -3/5 increase norvasc to 5mg  daily starting tomorrow  -3/6 Decrease losartan to 25mg /increase norvasc to 7.5mg  for tomorrow -3/7-8 BP improved, continue current regimen   Vitals:   03/17/23 1300 03/17/23 1935 03/17/23 1953 03/18/23 0453  BP: 130/72 (!) 150/91 (!) 157/93 (!) 172/98   03/18/23 1334 03/18/23 1917 03/19/23 1400 03/19/23 1938  BP: (!) 152/89 (!) 146/95 120/75 (!) 144/93   03/20/23 0520 03/20/23 1335 03/20/23 1934 03/21/23 0523  BP: (!) 152/98 (!) 148/95 (!) 142/99 (!) 144/90    10.H/O prostate cancer. Myrbetriq 50 mg daily.  -3/5 occasional incontinence but improving 11.Hyperlipidemia. Lipitor 80mg  daily 12.Constipation.             -miralax daily, SenokotS 1 tab daily -02/22/23 LBM 2days ago, monitor today, if no BM tomorrow then may want to increase regimen -2/25 LBM 2/23, consider addtional medication if no bowel movement today -3/8 LBM today, improved 13.H/O gout. Zyloprim 300mg  daily 14.  AKI versus CKD  -Encourage oral fluid intake, decrease spironolactone to 12.5 mg  -2/11 recheck BMP today  Addendum- increased Cr, stop spironolactone, start IVF NS 4ml/HR  -2/12 Cr slightly improved, continue IVF, encourage oral fluids  -2/13 recheck BMP- CR down to 1.2  -2/14 DC IVF, discussed oral hydration  2/17 BUN/CR stable 17/1.25  -2/18 continue to encourage oral hydration -2/20 BUN and creatinine improved to  21/1.25, got IV fluids last night 73ml/hr   2/21 Encourage PO fluid intake  -03/07/23 Cr 0.99 today, poor PO intake, a little dizzy this morning, will give IVFs @50ml /hr x12hrs now, and have standing PRN order for 26ml/hr x12hrs at bedtime if PO intake <1269ml/day -03/08/23 doing much better today after IVFs; leave standing order on for now, but encouraged pt to drink water throughout the day  2/24---give additional IVF today as Cr sl up 2/25 hold losartan, will hold off on IVF for now and encourage oral hydration.  Caution with IVF as x-ray chest with increased pulmonary congestion 2/27 50 mL/h IVF, gentle rate due to CHF history, started yesterday, continue today, creatinine slightly improved 2/28 Change IVF NS with potassium  to at night only, Cr and Bun improved. Can hold if drinking > fluids that day -03/14/23 doing well, monitor, hopefully can avoid using IVFs tonight 3/3 improved Cr/BUN continue to monitor  Will recheck BMP tomorrow make sure he is doing okay off IVF, encourage oral fluids 3/5 BUN/Cr a little higher, continue to encourage oral fluid intake.  Patient has set alarm to drink fluids 3/6 Cr/Bun continues to trend up, now at 1.4/16, give IVF tonight 3/7 Continue to encourage PO intake, recheck labs tomorrow- Could consider restart nighttime IVF if not trending down -03/21/23 Cr down to 1.19, continued encouraging PO intake, no IVFs 15.  History of CHF.  Last EF 55%, mild diastolic dysfunction  -Continue metoprolol, decrease spironolactone to 12.5 mg as above -2/14 Wts stable, has not appear to show signs of fluid overload, continue to monitor -02/28/23 wt up but doubt accuracy, monitor, no overload on exam -2/19-20 weight stable, does not appear to show signs of fluid overload, EF 40 to 45% on echo. -Cardiology has been following, they discontinued amlodipine and started losartan 25 mg daily-beginning tomorrow-- signing off 2/22 (see recs below)  -Follow-up with cardiology at  the [facility] postdischarge  -2/27 weight is overall stable, continue to monitor  -2/28 restart losartan  -3/4-5 weight trending a little up,  no signs of fluid overload at this time  -03/21/23 no signs of fluid overload Filed Weights   03/19/23 1216 03/20/23 0500 03/21/23 0523  Weight: 89.4 kg 88.9 kg 88.7 kg    16.  Severe cervical stenosis: Consider neurosurgery outpatient follow-up 17. Insomnia -Patient has Seroquel 50 mg scheduled and 25 mg as needed.  Will also add in 3 mg melatonin at bedtime  -2/11 increase Seroquel to 75 mg nightly  -2/13 asked nursing to complete sleep-wake chart  -2/14 reports sleeping better, continue current regimen for now and monitor  -2/20 will decrease Seroquel back to 50 mg nightly and 25 mg at bedtime  2/21 Decrease seroquel to 25mg  nightly and 25mg  PRN  2/25 increase Seroquel scheduled back to 50 nightly and 25 as needed  -2/28-3/8 reports he continues to sleep well, continue current regimen  18.  Bleeding from his penis  -UA neg,HGB was stable on 2/11 -Denies additional occurrences.  During team meeting was noted this happened intermittently during acute hospital stay  19. Nasal congestion: Flonase 1 spray daily  20. Staring episodes/vision changes 03/01/23 -03/01/23 having staring episodes and ?chronic vision changes/hemianopsia? Spoke with Dr. Derry Lory of neuro, will get 24hr EEG and head CT, no code stroke at this point; they will see pt, appreciate their assistance.  -2/17 overnight EEG neg, CT head without acute changes  21. Chest pain  -EKG, Trop, CXR ordered -Cardiology following appreciate assistance.  D-dimer positive, getting CT PE study to rule out PE.  If positive will need to discuss with neurology regarding anticoagulation.  He is now on 2 L nasal cannula oxygen  -2/20 patient reports his chest pain is resolved  22. Pulmonary embolism / DVT left popliteal vein -2/19 discussed with neurology and cardiology. Heparin grip started, wife  updated by phone. Critical care medicine consulted 2/20 seen by critical care, continue heparin eventual transition to DOAC, if hemodynamic changes contact PCCM again. Advised 24h bedrest with [facility] 2/21 plan to transition to DOAC later today, discussed with pharmacy by phone  -03/07/23 cardiology saw pt, recommend eliquis per protolocol, continue toprol/low dose losartan, and they signed off-- f/up in 3-4wks with Dr. Odis Hollingshead  23. Leukocytosis: 13k today -SLP felt that he was having more difficulties from a cognitive standpoint today  -he is afebrile. No cough or respiratory sx  -will check UA, UCX- UA does not indicate infection  -recheck CBC in AM  -CXR yesterday with pulmonary vascular congestion  -WBC WNL 3/3  24. Mild hyponatremia, mild continue to monitor   -3/6 stable at 136  -03/21/23 improved at 139  LOS: 28 days A FACE TO [facility] EVALUATION WAS PERFORMED  [address] 03/21/2023, 11:31 AM

## 2023-03-21 NOTE — Progress Notes (Signed)
 Occupational Therapy Session Note  Patient Details  Name: Edward Rodgers MRN: [id number] Date of Birth: [birth year]  Today's Date: 03/21/2023 OT Individual Time: 1430-1540 OT Individual Time Calculation (min): 70 min   Short Term Goals: Week 4:  OT Short Term Goal 1 (Week 4): Pt will perform BSC transfer with consitent Min A + LRAD. OT Short Term Goal 2 (Week 4): Pt will perform LB dressing with consistent Mod A + LRAD. OT Short Term Goal 3 (Week 4): Pt will perform 2/3 toileting activities with Min A + LRAD.  Skilled Therapeutic Interventions/Progress Updates:  Pt greeted sitting in [facility] for skilled OT session with focus on functional transfers and caregiver education.   Pain: Pt with no reports of pain, but does state his L-hand "starts spasm-ing when I use it to push the wheelchair." None present during session. OT offering intermediate rest breaks and positioning suggestions throughout session to address pain/fatigue and maximize participation/safety in session.  Functional Transfers/Education: Pt self propels WC from room>main therapy gym with supervision, using hemi-propulsion technique. Sit<>stands throughout session at [facility] A--therapist providing proprioceptive input at LUE to assist with initial lift-off. Standing balance with BUE on RW with CGA-light Min A (due to fatigue). Stand-pivot with use of RW attempted this session, overall Max A (+2 for safety) and OT assisting with safe landing onto chair. Stand-pivots without RW at [employer] A going towards R-side. Spouse present towards end of session, educated on sit<>stands (carryover into ADL participation), hands on completed this session. Spouse still indecisive about purchasing stedy lift for decreased caregiver burden, unable to truly state reasoning.   Pt remained sitting in WC with 4Ps assessed and immediate needs met. Pt continues to be appropriate for skilled OT intervention to promote further functional independence in ADLs/IADLs.    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 Restrictions Weight Bearing Restrictions Per Provider Order: No   Therapy/Group: Individual Therapy  Lou Cal, OTR/L, MSOT  03/21/2023, 5:53 AM

## 2023-03-22 NOTE — Progress Notes (Signed)
 PROGRESS NOTE   Subjective/Complaints:  Doing well again today, slept well, denies pain, LBM yesterday, urinating well, denies any other complaints or concerns today.    ROS: Patient denies fever, chills, CP, abdominal pain, HA, SOB, diarrhea, dizziness Insomnia- improved    Objective:   No results found.   No results for input(s): "WBC", "HGB", "HCT", "PLT" in the last 72 hours.    Recent Labs    03/21/23 0856  NA 139  K 3.6  CL 107  CO2 25  GLUCOSE 95  BUN 14  CREATININE 1.19  CALCIUM 9.2        Intake/Output Summary (Last 24 hours) at 03/22/2023 1610 Last data filed at 03/22/2023 0700 Gross per 24 hour  Intake 720 ml  Output 400 ml  Net 320 ml         Physical Exam: Vital Signs Blood pressure (!) 148/89, pulse (!) 109, temperature 97.8 F (36.6 C), temperature source Oral, resp. rate 18, height 5\' 9"  (1.753 m), weight 90.4 kg, SpO2 95%.  Constitutional: No distress . Vital signs reviewed.  Sitting in bedside chair HEENT: NCAT, EOMI, oral membranes moist Neck: supple Cardiovascular: RRR, no m/r/g appreciated Respiratory/Chest: CTA Bilaterally, non-labored  GI/Abdomen: BS +, non-tender, non-distended, soft Ext: no clubbing or cyanosis, very mild LUE edema but overall improving Psych: pleasant and cooperative Skin: No breakdown noted on visible portion  PRIOR EXAMS: Neuro:     He is awake and alert.   Oriented to month, day, hospital, self.  Mildly slow to process,   Language overall appears to be intact.  Decreased sensation to light touch in left upper extremity and left lower extremity.  RUE and RLE 4+/5 LLE 4/5 LUE shoulder abduction 3/5, distally 3 to 4-/5 Mildly Increased tone L hand -overall stable 3/7   Assessment/Plan: 1. Functional deficits which require 3+ hours per day of interdisciplinary therapy in a comprehensive inpatient rehab setting. Physiatrist is providing close team  supervision and 24 hour management of active medical problems listed below. Physiatrist and rehab team continue to assess barriers to discharge/monitor patient progress toward functional and medical goals  Care Tool:  Bathing    Body parts bathed by patient: Left arm, Chest, Abdomen, Front perineal area, Right upper leg, Left upper leg, Right lower leg, Face   Body parts bathed by helper: Right arm, Buttocks, Left lower leg     Bathing assist Assist Level: Moderate Assistance - Patient 50 - 74%     Upper Body Dressing/Undressing Upper body dressing        Upper body assist Assist Level: Moderate Assistance - Patient 50 - 74%    Lower Body Dressing/Undressing Lower body dressing            Lower body assist Assist for lower body dressing: Maximal Assistance - Patient 25 - 49%     Toileting Toileting    Toileting assist Assist for toileting: Moderate Assistance - Patient 50 - 74%     Transfers Chair/bed transfer  Transfers assist     Chair/bed transfer assist level: Minimal Assistance - Patient > 75%     Locomotion Ambulation   Ambulation assist   Ambulation activity did  not occur: Safety/medical concerns  Assist level: 2 helpers (+2 for WC follow) Assistive device: Walker-rolling Max distance: 50 feet   Walk 10 feet activity   Assist  Walk 10 feet activity did not occur: Safety/medical concerns  Assist level: 2 helpers Assistive device: Walker-rolling   Walk 50 feet activity   Assist Walk 50 feet with 2 turns activity did not occur: Safety/medical concerns  Assist level: 2 helpers Assistive device: Walker-rolling    Walk 150 feet activity   Assist Walk 150 feet activity did not occur: Safety/medical concerns         Walk 10 feet on uneven surface  activity   Assist Walk 10 feet on uneven surfaces activity did not occur: Safety/medical concerns         Wheelchair     Assist Is the patient using a wheelchair?: Yes Type  of Wheelchair: Manual    Wheelchair assist level: Minimal Assistance - Patient > 75% Max wheelchair distance: 50    Wheelchair 50 feet with 2 turns activity    Assist        Assist Level: Minimal Assistance - Patient > 75%   Wheelchair 150 feet activity     Assist      Assist Level: Maximal Assistance - Patient 25 - 49%   Blood pressure (!) 148/89, pulse (!) 109, temperature 97.8 F (36.6 C), temperature source Oral, resp. rate 18, height 5\' 9"  (1.753 m), weight 90.4 kg, SpO2 95%.  Medical Problem List and Plan: 1. 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  Await plan for loop recorder             -patient may shower -ELOS/Goals: 21-25 days, min assist with PT, OT and supervision with SLP -Continue CIR therapies including PT, OT, and SLP  -Discharge date adjusted to 3/15 -grounds pass -Left WHO ordered -Continue plan for DC 3/15    2.  Antithrombotics: -DVT/anticoagulation:  Pharmaceutical: eliquis per pharmacy -03/07/23 now on eliquis per pharmacy -antiplatelet therapy: Aspirin 81 mg daily and Plavix 75 mg day x 3 weeks then Plavix alone -2/20 DC aspirin and Plavix as on heparin, discussed with neurology and cardiology-- 2/22 now on eliquis  3. Pain Management: Lidoderm patch as directed, Tylenol as needed 4. Mood/Sleep/sundowning: Effexor 150 mg every evening, provide emotional support             -antipsychotic agents: Seroquel 50 mg nighty at 2030, have back up dose             -keep sleep chart 5. Neuropsych/cognition: This patient is capable of making decisions on his own behalf. 6. Skin/Wound Care: Routine skin checks 7. Fluids/Electrolytes/Nutrition: Routine in and outs with follow-up chemistries, continue vitamins/supplements 8.  Hypothyroidism.  Synthroid 58mcg/day 9.  Hypertension. Norvasc 10 mg daily, Toprol-Xl 100 mg daily, Aldactone 25 mg daily.             -bp  trending up. Likely will need additions to regimen  -02/22/23 BPs 140s/90s, not terrible, monitor trend with increase mobility  2/10 BP controlled, decrease spironolactone due to elevated CR/BUN -02/28/23 BP stable but creeping up, continue to monitor for now but may need to increase meds back -2/25 BP stable, hold losartan for now due to renal function -2/28 BP a little higher, restart low dose losartan 25mg  daily -3/3 increase losartan to 50 mg daily -3/4 Restart norvasc at lower dose 2.5mg  -3/5 increase norvasc to 5mg  daily  starting tomorrow  -3/6 Decrease losartan to 25mg /increase norvasc to 7.5mg  for tomorrow -3/7-9 BP improved, continue current regimen   Vitals:   03/18/23 0453 03/18/23 1334 03/18/23 1917 03/19/23 1400  BP: (!) 172/98 (!) 152/89 (!) 146/95 120/75   03/19/23 1938 03/20/23 0520 03/20/23 1335 03/20/23 1934  BP: (!) 144/93 (!) 152/98 (!) 148/95 (!) 142/99   03/21/23 0523 03/21/23 1330 03/21/23 1939 03/22/23 0407  BP: (!) 144/90 131/83 (!) 144/89 (!) 148/89    10.H/O prostate cancer. Myrbetriq 50 mg daily.  -3/5 occasional incontinence but improving 11.Hyperlipidemia. Lipitor 80mg  daily 12.Constipation.             -miralax daily, SenokotS 1 tab daily -02/22/23 LBM 2days ago, monitor today, if no BM tomorrow then may want to increase regimen -2/25 LBM 2/23, consider addtional medication if no bowel movement today -03/22/23 LBM yesterday, improved 13.H/O gout. Zyloprim 300mg  daily 14.  AKI versus CKD  -Encourage oral fluid intake, decrease spironolactone to 12.5 mg  -2/11 recheck BMP today  Addendum- increased Cr, stop spironolactone, start IVF NS 7ml/HR  -2/12 Cr slightly improved, continue IVF, encourage oral fluids  -2/13 recheck BMP- CR down to 1.2  -2/14 DC IVF, discussed oral hydration  2/17 BUN/CR stable 17/1.25  -2/18 continue to encourage oral hydration -2/20 BUN and creatinine improved to 21/1.25, got IV fluids last night 74ml/hr   2/21 Encourage PO fluid  intake  -03/07/23 Cr 0.99 today, poor PO intake, a little dizzy this morning, will give IVFs @50ml /hr x12hrs now, and have standing PRN order for 51ml/hr x12hrs at bedtime if PO intake <1237ml/day -03/08/23 doing much better today after IVFs; leave standing order on for now, but encouraged pt to drink water throughout the day  2/24---give additional IVF today as Cr sl up 2/25 hold losartan, will hold off on IVF for now and encourage oral hydration.  Caution with IVF as x-ray chest with increased pulmonary congestion 2/27 50 mL/h IVF, gentle rate due to CHF history, started yesterday, continue today, creatinine slightly improved 2/28 Change IVF NS with potassium  to at night only, Cr and Bun improved. Can hold if drinking > fluids that day -03/14/23 doing well, monitor, hopefully can avoid using IVFs tonight 3/3 improved Cr/BUN continue to monitor  Will recheck BMP tomorrow make sure he is doing okay off IVF, encourage oral fluids 3/5 BUN/Cr a little higher, continue to encourage oral fluid intake.  Patient has set alarm to drink fluids 3/6 Cr/Bun continues to trend up, now at 1.4/16, give IVF tonight 3/7 Continue to encourage PO intake, recheck labs tomorrow- Could consider restart nighttime IVF if not trending down -03/21/23 Cr down to 1.19, continued encouraging PO intake, no IVFs 15.  History of CHF.  Last EF 55%, mild diastolic dysfunction  -Continue metoprolol, decrease spironolactone to 12.5 mg as above -2/14 Wts stable, has not appear to show signs of fluid overload, continue to monitor -02/28/23 wt up but doubt accuracy, monitor, no overload on exam -2/19-20 weight stable, does not appear to show signs of fluid overload, EF 40 to 45% on echo. -Cardiology has been following, they discontinued amlodipine and started losartan 25 mg daily-beginning tomorrow-- signing off 2/22 (see recs below)  -Follow-up with cardiology at the [facility] postdischarge  -2/27 weight is overall stable, continue to  monitor  -2/28 restart losartan  -3/4-5 weight trending a little up, no signs of fluid overload at this time  -03/22/23 no signs of fluid overload although wt a tiny bit  up [facility] Weights   03/20/23 0500 03/21/23 0523 03/22/23 0500  Weight: 88.9 kg 88.7 kg 90.4 kg    16.  Severe cervical stenosis: Consider neurosurgery outpatient follow-up 17. Insomnia -Patient has Seroquel 50 mg scheduled and 25 mg as needed.  Will also add in 3 mg melatonin at bedtime  -2/11 increase Seroquel to 75 mg nightly  -2/13 asked nursing to complete sleep-wake chart  -2/14 reports sleeping better, continue current regimen for now and monitor  -2/20 will decrease Seroquel back to 50 mg nightly and 25 mg at bedtime  2/21 Decrease seroquel to 25mg  nightly and 25mg  PRN  2/25 increase Seroquel scheduled back to 50 nightly and 25 as needed  -2/28-3/9 reports he continues to sleep well, continue current regimen  18.  Bleeding from his penis  -UA neg,HGB was stable on 2/11 -Denies additional occurrences.  During team meeting was noted this happened intermittently during acute hospital stay  19. Nasal congestion: Flonase 1 spray daily  20. Staring episodes/vision changes 03/01/23 -03/01/23 having staring episodes and ?chronic vision changes/hemianopsia? Spoke with Dr. Derry Lory of neuro, will get 24hr EEG and head CT, no code stroke at this point; they will see pt, appreciate their assistance.  -2/17 overnight EEG neg, CT head without acute changes  21. Chest pain  -EKG, Trop, CXR ordered -Cardiology following appreciate assistance.  D-dimer positive, getting CT PE study to rule out PE.  If positive will need to discuss with neurology regarding anticoagulation.  He is now on 2 L nasal cannula oxygen  -2/20 patient reports his chest pain is resolved  22. Pulmonary embolism / DVT left popliteal vein -2/19 discussed with neurology and cardiology. Heparin grip started, wife updated by phone. Critical care medicine  consulted 2/20 seen by critical care, continue heparin eventual transition to DOAC, if hemodynamic changes contact PCCM again. Advised 24h bedrest with [facility] 2/21 plan to transition to DOAC later today, discussed with pharmacy by phone  -03/07/23 cardiology saw pt, recommend eliquis per protolocol, continue toprol/low dose losartan, and they signed off-- f/up in 3-4wks with Dr. Odis Hollingshead  23. Leukocytosis: 13k today -SLP felt that he was having more difficulties from a cognitive standpoint today  -he is afebrile. No cough or respiratory sx  -will check UA, UCX- UA does not indicate infection  -recheck CBC in AM  -CXR yesterday with pulmonary vascular congestion  -WBC WNL 3/3  24. Mild hyponatremia, mild continue to monitor   -3/6 stable at 136  -03/21/23 improved at 139  LOS: 29 days A FACE TO [facility] EVALUATION WAS PERFORMED  [id number] Martine 03/22/2023, 8:12 AM

## 2023-03-22 NOTE — Progress Notes (Signed)
 Speech Language Pathology Daily Session Note  Patient Details  Name: Edward Rodgers MRN: [id number] Date of Birth: 1950-02-02  Today's Date: 03/22/2023 SLP Individual Time: 1007-1100 SLP Individual Time Calculation (min): 53 min  Short Term Goals: Week 4: SLP Short Term Goal 1 (Week 4): Pt will complete functional problem solving tasks @ 90% accuracy given modA SLP Short Term Goal 2 (Week 4): Pt will utilize external memory aids as needed to recall recent/relevant information w/ 80% accuracy given modA visual/verbal cues SLP Short Term Goal 3 (Week 4): When prompted, pt will demonstrate awareness of errors in 3/5 attempts with modA  Skilled Therapeutic Interventions:  Pt initially using the restroom upon SLP arrival, NT provided asst to get him in his chair. Denied pain. When SLP mentioned the time change, pt displayed poor awareness of physical deficits by stating that earlier he had intended to change his room clock. When oriented to this deficit, he verbalized understanding. 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 Pt struggled to name potential challenges but when SLP went through scenarios, he was able to determine solutions when provided min-mod verbal cueing. SLP then transitioned to problem solving and memory tasks using pt's phone. He declined to practice using his phone for a mock sermon but was able to search for a preferred video independently, required min A to locate and use start/pause buttons. He expressed frustration with many notifications from text threads and SLP showed him 3 step process x2 as model. Pt able to silence notifications with 100% acc when provided min visual cues up to 10 minute delay. Pt left in chair with alarm set and call bell in reach. Continue POC.   Pain Pain Assessment Pain Scale: 0-10 Pain Score: 0-No pain Faces Pain Scale: No hurt  Therapy/Group: Individual Therapy  [name] 03/22/2023, 12:01 PM

## 2023-03-22 NOTE — Progress Notes (Signed)
 Physical Therapy Session Note  Patient Details  Name: Edward Rodgers MRN: [id number] Date of Birth: 03-Jun-1950  Today's Date: 03/22/2023 PT Individual Time: 1450-1600 PT Individual Time Calculation (min): 70 min   Short Term Goals: Week 4:  PT Short Term Goal 1 (Week 4): pt will perform bed mobility with CGA or less consistently PT Short Term Goal 2 (Week 4): pt will perform stand pivot transfer consistently with mod A or less PT Short Term Goal 3 (Week 4): pt will ambulate with LRAD for neuromuscular reeducation  Skilled Therapeutic Interventions/Progress Updates:    Chart reviewed and pt agreeable to therapy. Pt received seated in WC with no c/o pain. Session focused on amb practice and WC propulsion to promote home mobility. Pt initiated session with maxA to donn socks and shoes. Pt then taken to therapy gym for time. In gym, pt completed multiple rounds of amb ranging 72ft-45ft using modA + RW. Pt noted to have LLE drift across midline and pt overall amb towards R side. Pt given VC to mitigate. Pt then completed standing marching with modA + RW and emphasis on LLE control during placement on ground. Pt again able to mitigate LLE crossing midline with VC. Pt then completed 176ft WC propulsion towards room using CGA to support direction of L side.At end of session, pt was left seated in [facility] with alarm engaged, nurse call bell and all needs in reach.     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 Restrictions Weight Bearing Restrictions Per Provider Order: No General:      Therapy/Group: Individual Therapy  Dionne Milo, PT, DPT 03/22/2023, 4:01 PM

## 2023-03-23 ENCOUNTER — Inpatient Hospital Stay (HOSPITAL_COMMUNITY)

## 2023-03-23 LAB — BASIC METABOLIC PANEL
Anion gap: 7 (ref 5–15)
BUN: 16 mg/dL (ref 8–23)
CO2: 28 mmol/L (ref 22–32)
Calcium: 9.5 mg/dL (ref 8.9–10.3)
Chloride: 104 mmol/L (ref 98–111)
Creatinine, Ser: 1.03 mg/dL (ref 0.61–1.24)
GFR, Estimated: >60 mL/min (ref >60)
Glucose, Bld: 92 mg/dL (ref 70–99)
Potassium: 3.7 mmol/L (ref 3.5–5.1)
Sodium: 139 mmol/L (ref 135–145)

## 2023-03-23 LAB — CBC
HCT: 36.7 % — ABNORMAL LOW (ref 39.0–52.0)
Hemoglobin: 11.4 g/dL — ABNORMAL LOW (ref 13.0–17.0)
MCH: 27.9 pg (ref 26.0–34.0)
MCHC: 31.1 g/dL (ref 30.0–36.0)
MCV: 89.7 fL (ref 80.0–100.0)
Platelets: 259 10*3/uL (ref 150–400)
RBC: 4.09 MIL/uL — ABNORMAL LOW (ref 4.22–5.81)
RDW: 15.3 % (ref 11.5–15.5)
WBC: 4.4 10*3/uL (ref 4.0–10.5)
nRBC: 0 % (ref 0.0–0.2)

## 2023-03-23 MED ORDER — AMLODIPINE BESYLATE 10 MG PO TABS
10.0000 mg | ORAL_TABLET | Freq: Every day | ORAL | Status: DC
  Administered 2023-03-24 – 2023-03-28 (×5): 10 mg via ORAL
  Filled 2023-03-23 (×5): qty 1

## 2023-03-23 NOTE — Progress Notes (Signed)
 Received a call from Jore RN at 22:00 PM. She states Mr. Skolnick was found on his stomach at 20:09, she called this provider at 22:00 regarding the fall.  Jore Acupuncturist found him on the floor, asked about what happen Jore RN states Mr. Deren was sitting in his wheelchair reaching for something on the floor and fell forward. He denies hitting his head on the floor. Since the fall was  un-witness, CT without contrast was ordered, Dr Riley Kill note was reviewed.  Spoke with charge Nurse regarding the above. She verbalizes understanding.

## 2023-03-23 NOTE — Progress Notes (Signed)
 Speech Language Pathology Weekly Progress and Session Note  Patient Details  Name: Edward Rodgers MRN: [id number] Date of Birth: [birth year]  Beginning of progress report period: March 16, 2023 End of progress report period: March 23, 2023  Today's Date: 03/23/2023 SLP Individual Time: 7829-5621 SLP Individual Time Calculation (min): 40 min  Short Term Goals: Week 4: SLP Short Term Goal 1 (Week 4): Pt will complete functional problem solving tasks @ 90% accuracy given modA SLP Short Term Goal 1 - Progress (Week 4): Met SLP Short Term Goal 2 (Week 4): Pt will utilize external memory aids as needed to recall recent/relevant information w/ 80% accuracy given modA visual/verbal cues SLP Short Term Goal 2 - Progress (Week 4): Met SLP Short Term Goal 3 (Week 4): When prompted, pt will demonstrate awareness of errors in 3/5 attempts with modA SLP Short Term Goal 3 - Progress (Week 4): Met  New Short Term Goals: Week 5: SLP Short Term Goal 1 (Week 5): STGs=LTGs d/t ELOS  Weekly Progress Updates: Patient has continued to make steady progress towards therapy goals, meeting 3/3 short term goals set this admission. Patient currently requires mod assist for all basic cognitive functions including memory of daily events, awareness of deficits and anticipatory awareness of challenges stemming from deficits, and functional problem solving in his environment. Patient and family education ongoing. Patient will continue to benefit from skilled therapy services during remainder of CIR stay.    Intensity: Minumum of 1-2 x/day, 30 to 90 minutes Frequency: 3 to 5 out of 7 days Duration/Length of Stay: 3/15 Treatment/Interventions: Cognitive remediation/compensation;Environmental controls;Cueing hierarchy;Functional tasks;Therapeutic Activities;Internal/external aids;Patient/family education   Daily Session  Skilled Therapeutic Interventions: SLP conducted skilled therapy session targeting cognitive retraining  goals. Patient greeted asleep in bed but easily rousable, repositioned into upright position with min assist, and agreeable to all therapy tasks. Patient completed basic computer-based skills such as logging in and navigating Youtube to find preferred videos with overall min assist. Patient then requested to use commode, transferred via [place]. During transfer patient benefited from mod cues for safety sequencing and supervision for following directions. Continent of bladder. Transferred back to chair, SLP facilitated schedule problem solving task. Patient required mod assist for basic time management problem solving. Patient was left in lowered bed with call bell in reach and bed alarm set. SLP will continue to target goals per plan of care.       Pain Pain Assessment Pain Scale: 0-10 Pain Score: 0-No pain  Therapy/Group: Individual Therapy  [name], M.A., CCC-SLP  [name] 03/23/2023, 8:51 AM

## 2023-03-23 NOTE — Progress Notes (Signed)
 PROGRESS NOTE   Subjective/Complaints:  Pt without complaints. Up on his chair looking at his laptop. Denies pain.   ROS: Patient denies fever, rash, sore throat, blurred vision, dizziness, nausea, vomiting, diarrhea, cough, shortness of breath or chest pain, joint or back/neck pain, headache, or mood change.    Objective:   No results found.   Recent Labs    03/23/23 0626  WBC 4.4  HGB 11.4*  HCT 36.7*  PLT 259      Recent Labs    03/21/23 0856 03/23/23 0626  NA 139 139  K 3.6 3.7  CL 107 104  CO2 25 28  GLUCOSE 95 92  BUN 14 16  CREATININE 1.19 1.03  CALCIUM 9.2 9.5        Intake/Output Summary (Last 24 hours) at 03/23/2023 1247 Last data filed at 03/23/2023 0703 Gross per 24 hour  Intake 960 ml  Output 500 ml  Net 460 ml         Physical Exam: Vital Signs Blood pressure (!) 152/93, pulse 72, temperature 97.8 F (36.6 C), temperature source Oral, resp. rate 18, height 5\' 9"  (1.753 m), weight 91.2 kg, SpO2 96%.  Constitutional: No distress . Vital signs reviewed. HEENT: NCAT, EOMI, oral membranes moist Neck: supple Cardiovascular: RRR without murmur. No JVD    Respiratory/Chest: CTA Bilaterally without wheezes or rales. Normal effort    GI/Abdomen: BS +, non-tender, non-distended Ext: no clubbing, cyanosis, or edema Psych: pleasant and cooperative  Skin: No breakdown noted on visible portion    Neuro:     He is awake and alert.   Oriented to month, day, hospital, self. Sl slow processing.   Language overall appears to be intact.    RUE and RLE 4+/5 LLE 4/5 LUE 3 to 3+/4 prox to distall. Mildly Increased tone L hand Mild sensory deficits left arm and leg.    Assessment/Plan: 1. Functional deficits which require 3+ hours per day of interdisciplinary therapy in a comprehensive inpatient rehab setting. Physiatrist is providing close team supervision and 24 hour management of active  medical problems listed below. Physiatrist and rehab team continue to assess barriers to discharge/monitor patient progress toward functional and medical goals  Care Tool:  Bathing    Body parts bathed by patient: Left arm, Chest, Abdomen, Front perineal area, Right upper leg, Left upper leg, Right lower leg, Face   Body parts bathed by helper: Right arm, Buttocks, Left lower leg     Bathing assist Assist Level: Moderate Assistance - Patient 50 - 74%     Upper Body Dressing/Undressing Upper body dressing        Upper body assist Assist Level: Moderate Assistance - Patient 50 - 74%    Lower Body Dressing/Undressing Lower body dressing            Lower body assist Assist for lower body dressing: Maximal Assistance - Patient 25 - 49%     Toileting Toileting    Toileting assist Assist for toileting: Moderate Assistance - Patient 50 - 74%     Transfers Chair/bed transfer  Transfers assist     Chair/bed transfer assist level: Minimal Assistance - Patient > 75%  Locomotion Ambulation   Ambulation assist   Ambulation activity did not occur: Safety/medical concerns  Assist level: 2 helpers (+2 for WC follow) Assistive device: Walker-rolling Max distance: 50 feet   Walk 10 feet activity   Assist  Walk 10 feet activity did not occur: Safety/medical concerns  Assist level: 2 helpers Assistive device: Walker-rolling   Walk 50 feet activity   Assist Walk 50 feet with 2 turns activity did not occur: Safety/medical concerns  Assist level: 2 helpers Assistive device: Walker-rolling    Walk 150 feet activity   Assist Walk 150 feet activity did not occur: Safety/medical concerns         Walk 10 feet on uneven surface  activity   Assist Walk 10 feet on uneven surfaces activity did not occur: Safety/medical concerns         Wheelchair     Assist Is the patient using a wheelchair?: Yes Type of Wheelchair: Manual    Wheelchair assist  level: Minimal Assistance - Patient > 75% Max wheelchair distance: 50    Wheelchair 50 feet with 2 turns activity    Assist        Assist Level: Minimal Assistance - Patient > 75%   Wheelchair 150 feet activity     Assist      Assist Level: Maximal Assistance - Patient 25 - 49%   Blood pressure (!) 152/93, pulse 72, temperature 97.8 F (36.6 C), temperature source Oral, resp. rate 18, height 5\' 9"  (1.753 m), weight 91.2 kg, SpO2 96%.  Medical Problem List and Plan: 1. 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  Await plan for loop recorder             -patient may shower -ELOS/Goals: 3/15 d  min assist with PT, OT and supervision with SLP   -grounds pass -Left WHO ordered -Continue CIR therapies including PT, OT, and SLP     2.  Antithrombotics: -DVT/anticoagulation:  Pharmaceutical: eliquis per pharmacy -03/07/23 now on eliquis per pharmacy -antiplatelet therapy: Aspirin 81 mg daily and Plavix 75 mg day x 3 weeks then Plavix alone -2/20 DC aspirin and Plavix as on heparin, discussed with neurology and cardiology-- 2/22 now on eliquis  3. Pain Management: Lidoderm patch as directed, Tylenol as needed 4. Mood/Sleep/sundowning: Effexor 150 mg every evening, provide emotional support             -antipsychotic agents: Seroquel 50 mg nighty at 2030, have back up dose             -keep sleep chart 5. Neuropsych/cognition: This patient is capable of making decisions on his own behalf. 6. Skin/Wound Care: Routine skin checks 7. Fluids/Electrolytes/Nutrition: Routine in and outs with follow-up chemistries, continue vitamins/supplements 8.  Hypothyroidism.  Synthroid 67mcg/day 9.  Hypertension. Norvasc 10 mg daily, Toprol-Xl 100 mg daily, Aldactone 25 mg daily.             -bp trending up. Likely will need additions to regimen  -02/22/23 BPs 140s/90s, not terrible, monitor trend with increase  mobility  2/10 BP controlled, decrease spironolactone due to elevated CR/BUN -02/28/23 BP stable but creeping up, continue to monitor for now but may need to increase meds back -2/25 BP stable, hold losartan for now due to renal function -2/28 BP a little higher, restart low dose losartan 25mg  daily -3/3 increase losartan to 50 mg daily -3/4 Restart norvasc at lower dose 2.5mg  -3/5 increase norvasc  to 5mg  daily starting tomorrow  -3/6 Decrease losartan to 25mg /increase norvasc to 7.5mg  for tomorrow -3/10- bp still running high--increase norvasc to 10mg  tomorrow   Vitals:   03/19/23 1938 03/20/23 0520 03/20/23 1335 03/20/23 1934  BP: (!) 144/93 (!) 152/98 (!) 148/95 (!) 142/99   03/21/23 0523 03/21/23 1330 03/21/23 1939 03/22/23 0407  BP: (!) 144/90 131/83 (!) 144/89 (!) 148/89   03/22/23 1129 03/22/23 1958 03/22/23 2136 03/23/23 0409  BP: (!) 151/86 123/84 123/84 (!) 152/93    10.H/O prostate cancer. Myrbetriq 50 mg daily.  -3/5 occasional incontinence but improving 11.Hyperlipidemia. Lipitor 80mg  daily 12.Constipation.             -miralax daily, SenokotS 1 tab daily -02/22/23 LBM 2days ago, monitor today, if no BM tomorrow then may want to increase regimen -2/25 LBM 2/23, consider addtional medication if no bowel movement today -  LBM 3/8 13.H/O gout. Zyloprim 300mg  daily 14.  AKI versus CKD  -Encourage oral fluid intake, decrease spironolactone to 12.5 mg   -3/10- Bn/Cr 16/1---continue to encourage fluids15.  History of CHF.  Last EF 55%, mild diastolic dysfunction  -Continue metoprolol, decrease spironolactone to 12.5 mg as above -2/14 Wts stable, has not appear to show signs of fluid overload, continue to monitor -02/28/23 wt up but doubt accuracy, monitor, no overload on exam -2/19-20 weight stable, does not appear to show signs of fluid overload, EF 40 to 45% on echo. -Cardiology has been following, they discontinued amlodipine and started losartan 25 mg daily-beginning  tomorrow-- signing off 2/22 (see recs below)  -Follow-up with cardiology at the [facility] postdischarge  -2/27 weight is overall stable, continue to monitor  -2/28 restart losartan  -3/4-5 weight trending a little up, no signs of fluid overload at this time  -3/10--weight trending up, 7L up for admit--may need to resume aldactone if weight goes up any further Filed Weights   03/21/23 0523 03/22/23 0500 03/23/23 0500  Weight: 88.7 kg 90.4 kg 91.2 kg    16.  Severe cervical stenosis: Consider neurosurgery outpatient follow-up 17. Insomnia -Patient has Seroquel 50 mg scheduled and 25 mg as needed.  Will also add in 3 mg melatonin at bedtime  -2/11 increase Seroquel to 75 mg nightly  -2/13 asked nursing to complete sleep-wake chart  -2/14 reports sleeping better, continue current regimen for now and monitor  -2/20 will decrease Seroquel back to 50 mg nightly and 25 mg at bedtime  2/21 Decrease seroquel to 25mg  nightly and 25mg  PRN  2/25 increase Seroquel scheduled back to 50 nightly and 25 as needed  -2/28-3/10 reports he continues to sleep well, continue current regimen  18.  Bleeding from his penis  -UA neg,HGB was stable on 2/11 -Denies additional occurrences.  During team meeting was noted this happened intermittently during acute hospital stay  19. Nasal congestion: Flonase 1 spray daily  20. Staring episodes/vision changes 03/01/23 -03/01/23 having staring episodes and ?chronic vision changes/hemianopsia? Spoke with Dr. Derry Lory of neuro, will get 24hr EEG and head CT, no code stroke at this point; they will see pt, appreciate their assistance.  -2/17 overnight EEG neg, CT head without acute changes  21. Chest pain  -EKG, Trop, CXR ordered -Cardiology following appreciate assistance.  D-dimer positive, getting CT PE study to rule out PE.  If positive will need to discuss with neurology regarding anticoagulation.  He is now on 2 L nasal cannula oxygen  -2/20 patient reports his chest  pain is resolved  22. Pulmonary embolism /  DVT left popliteal vein -2/19 discussed with neurology and cardiology. Heparin grip started, wife updated by phone. Critical care medicine consulted 2/20 seen by critical care, continue heparin eventual transition to DOAC, if hemodynamic changes contact PCCM again. Advised 24h bedrest with [facility] 2/21 plan to transition to DOAC later today, discussed with pharmacy by phone  -03/07/23 cardiology saw pt, recommend eliquis per protolocol, continue toprol/low dose losartan, and they signed off-- f/up in 3-4wks with Dr. Odis Hollingshead  23. Leukocytosis: 13k today -SLP felt that he was having more difficulties from a cognitive standpoint today  -he is afebrile. No cough or respiratory sx  -will check UA, UCX- UA does not indicate infection  -recheck CBC in AM  -CXR yesterday with pulmonary vascular congestion  -WBC WNL   24. Mild hyponatremia, mild continue to monitor   -3/6 stable at 136  -03/23/23 stable at 139  LOS: 30 days A FACE TO FACE EVALUATION WAS PERFORMED  Ranelle Oyster 03/23/2023, 12:47 PM

## 2023-03-23 NOTE — Plan of Care (Signed)
  Problem: RH Tub/Shower Transfers Goal: LTG Patient will perform tub/shower transfers w/assist (OT) Description: LTG: Patient will perform tub/shower transfers with assist, with/without cues using equipment (OT) Outcome: Not Applicable Note: Goal discontinued this date due to inaccessible home environment and caregiver's decreased availability for thorough education.

## 2023-03-23 NOTE — Progress Notes (Signed)
 Occupational Therapy Weekly Progress Note  Patient Details  Name: Edward Rodgers MRN: [id number] Date of Birth: 1951/01/10  Beginning of progress report period: March 16, 2023 End of progress report period: March 23, 2023  Patient currently requires min A for UB ADLs, overall mod A for LB ADLs & toileting, static balance during ADLs at CGA-Min A, closer to Mod A with unilateral support and increased fatigue. Tub/shower goal discharged this date due to inaccessible home environment and decreased caregiver availability for thorough education.  Patient continues to demonstrate the following deficits: muscle weakness, decreased cardiorespiratoy endurance, impaired timing and sequencing, unbalanced muscle activation, decreased coordination, and decreased motor planning, decreased visual acuity and decreased visual perceptual skills, decreased midline orientation and decreased attention to left, decreased attention, decreased awareness, decreased problem solving, decreased safety awareness, decreased memory, and delayed processing, and decreased sitting balance, decreased standing balance, decreased postural control, and decreased balance strategies and therefore will continue to benefit from skilled OT intervention to enhance overall performance with BADL and Reduce care partner burden.  Patient not progressing toward long term goals.  See goal revision..  Plan of care revisions: 3/10.  OT Short Term Goals Week 4:  OT Short Term Goal 1 (Week 4): Pt will perform BSC transfer with consitent Min A + LRAD. OT Short Term Goal 1 - Progress (Week 4): Progressing toward goal OT Short Term Goal 2 (Week 4): Pt will perform LB dressing with consistent Mod A + LRAD. OT Short Term Goal 2 - Progress (Week 4): Progressing toward goal OT Short Term Goal 3 (Week 4): Pt will perform 2/3 toileting activities with Min A + LRAD. OT Short Term Goal 3 - Progress (Week 4): Progressing toward goal Week 5:  OT Short Term Goal  1 (Week 5): STGs=LTGs due to patient's estimated length of stay.  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 Restrictions Weight Bearing Restrictions Per Provider Order: No   Therapy/Group: Individual Therapy  Lou Cal, OTR/L, MSOT  03/23/2023, 6:27 AM

## 2023-03-23 NOTE — Progress Notes (Signed)
 Occupational Therapy Session Note  Patient Details  Name: Edward Rodgers MRN: [id number] Date of Birth: Aug 10, 1950  Today's Date: 03/23/2023 OT Individual Time: 4098-1191 OT Individual Time Calculation (min): 70 min   Today's Date: 03/23/2023 OT Individual Time: 1305-1330 OT Individual Time Calculation (min): 25 min   Short Term Goals: Week 4:  OT Short Term Goal 1 (Week 4): Pt will perform BSC transfer with consitent Min A + LRAD. OT Short Term Goal 2 (Week 4): Pt will perform LB dressing with consistent Mod A + LRAD. OT Short Term Goal 3 (Week 4): Pt will perform 2/3 toileting activities with Min A + LRAD.  Skilled Therapeutic Interventions/Progress Updates:   Session 1: Pt greeted sitting in WC, direct handoff from PT, skilled OT session with focus on functional transfers and discharge planning.   Pain: Pt with no reports of pain, OT offering intermediate rest breaks and positioning suggestions throughout session to address pain/fatigue and maximize participation/safety in session.   Functional Transfers: Pt self propels WC to all therapy locations with supervision/cuing (for turning) utilizing hemi-propulsion technique. In tub room, pt and OT discuss bathroom setup at length, unsure of home measurements/layout (despite max efforts from therapy team to complete home measurement sheet), pt then performs stand-pivot x2 from WC<>toilet, Min A from WC>BSC/toilet moving towards R, Mod A from BSC>WC moving towards L. L WC armrest equipped with dycen to improve integration of LUE into functional transfers.  Therapeutic Activities: Pt tolerates standing at side of EOM with overall Min A + no UE, for ~4 mins.   Education: Pt educated on plan for sponge-bathing at discharge due to complexity of shower transfer and decreased caregiver support. Re-iterated that patient is not to attempt ambulation upon discharge   Pt remained sitting in WC, in direct care of NT for toileting needs. 4Ps assessed  and immediate needs met. Pt continues to be appropriate for skilled OT intervention to promote further functional independence in ADLs/IADLs.   Session 2: Pt greeted sitting in WC, attempting to propel self with all 4 limbs, no posey belt donned, and evidence of beginning to slide forward in WC. Pt re-educated on safety concern, patient adjusts self in WC, and WC seat belt donned. Pt propels WC to main therapy gym for general conditioning with distant supervision. In main therapy gym, OT completes assessment of current LUE functioning for proper documentation in custom WC evaluation. Pt demos improvements in LUE elbow flexion/extension and digital activation. Pt returned to room, posey belt activated and B foot rests donned. NT made aware of patient location. Pt continues to be appropriate for skilled OT intervention to promote further functional independence in ADLs/IADLs.    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 Restrictions Weight Bearing Restrictions Per Provider Order: No   Therapy/Group: Individual Therapy  Lou Cal, OTR/L, MSOT  03/23/2023, 6:26 AM

## 2023-03-23 NOTE — Progress Notes (Signed)
 Physical Therapy Session Note  Patient Details  Name: Edward Rodgers MRN: [id number] Date of Birth: 04-15-1950  Today's Date: 03/23/2023 PT Individual Time: 4098-1191 PT Individual Time Calculation (min): 61 min   Short Term Goals: Week 4:  PT Short Term Goal 1 (Week 4): pt will perform bed mobility with CGA or less consistently PT Short Term Goal 2 (Week 4): pt will perform stand pivot transfer consistently with mod A or less PT Short Term Goal 3 (Week 4): pt will ambulate with LRAD for neuromuscular reeducation  Skilled Therapeutic Interventions/Progress Updates:      Pt seated in WC upon arrival. Pt agreeable to therapy. Pt wife present throughout session.  Pt requesting to use the bathroom, pt requesting to ambulate to the bathroom.   Pt ambulated ~20 feet with RW and L UE splint on RW with heavy mod A-light max with +2 for WC follow --- pt required assist to manage RW, with max verbal cues provided for sequencing for navigating tight spaces. Upon entry to bathroom, opted to sit in [facility], as pt L UE sliding out of L UE splint.   Pt performed stand pivot transfer WC to [facility] with use of grab bars and min A, verbal cues provided for sequencing. Pt doffed pants with mod A, perfomred pericare total A while standing with RW and CGA/min A, verbal cues provided for correction of L lateral lean. Pt performed stand pivot transfer BSC to WC with use of RW and mod A, verbal cues provided for sequencing.   Therapist followed up with pt and family to determine if they are planning to purchase steady. Pt reports they will be purchasing it, pt wife eventually agreeable after observing toileting session without the steady. Pt brother reports they have the handout for it at home and will purchase it.   Pt ambulated 1x50 feet (with pt navigating R turn), with RW and mod A, with pt managing the RW for safety, intermittent verbal cues provided for correction of L Lateral trunk lean, and correction of L LE  crossing midline. Pt ambulated 1x81 feet with use of line on floor as visual cue to pt with min A for navigating straight away, and mod A for navigating turns/straight away (with fatigue). Pt demos improved gait pattern with use of line as visual cue, intermittent verbal cues provided for correction of lateral trunk lean, and safety with RW.   Pt seated in WC at end of session with all needs within reach and seatbelt alarm on.    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 Restrictions Weight Bearing Restrictions Per Provider Order: No   Therapy/Group: Individual Therapy  [facility] [place], [place], DPT  03/23/2023, 3:54 PM

## 2023-03-23 NOTE — Progress Notes (Signed)
 Physical Therapy Session Note  Patient Details  Name: Edward Rodgers MRN: [id number] Date of Birth: [birth year]  Today's Date: 03/23/2023 PT Individual Time: 0935-1000 PT Individual Time Calculation (min): 25 min   Short Term Goals: Week 4:  PT Short Term Goal 1 (Week 4): pt will perform bed mobility with CGA or less consistently PT Short Term Goal 2 (Week 4): pt will perform stand pivot transfer consistently with mod A or less PT Short Term Goal 3 (Week 4): pt will ambulate with LRAD for neuromuscular reeducation  Skilled Therapeutic Interventions/Progress Updates:    Pt presents in room seated in WC, agreeable to PT. Pt does not report pain during session. Session focused on therapeutic activities for participating with self care tasks as well as therapeutic exercise to promote BLE strengthening and dynamic standing balance.  Pt completes dressing in sitting, with therapist providing assist for doffing hospital gown, requires mod assist for donning shirt in sitting for LUE management, cues providing to facilitate participation with pt able to utilize RUE for managing L sleeve and pulling shirt overhead. Pt then requires mod assist for donning pants, therapist provides cues to maintain LLE LAQ throughout task to promote muscle fiber recruitment and muscular endurance with pt able to maintain while threading pants, pt stands to RW with min assist and able to manage pants over hips with min assist for managing pants over hips as well as postural stability with LUE support only. Pt returns to sitting and therapist dons socks and shoes total assist however pt able to maintain LAQ for task with LLE.  Pt then completes standing therex with BUE support on RW for BLE strengthening and dynamic standing balance, min assist for postural stability and min cues for R weightshift to correct for excessive L lateral lean in standing with following exercises: - mini squats x10 - alternating BLE marches x20 -  seated heel raise x10 (tactile/verbal cues for heel raise on LLE with pt demonstrating minimal clearance however improves with repetition)  Pt returns to sitting in [facility], handoff to OT in room at end of session.  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 Restrictions Weight Bearing Restrictions Per Provider Order: No   Therapy/Group: Individual Therapy  [name] PT, DPT 03/23/2023, 10:06 AM

## 2023-03-24 MED ORDER — SPIRONOLACTONE 12.5 MG HALF TABLET
12.5000 mg | ORAL_TABLET | Freq: Every day | ORAL | Status: DC
  Administered 2023-03-24 – 2023-03-28 (×5): 12.5 mg via ORAL
  Filled 2023-03-24 (×5): qty 1

## 2023-03-24 NOTE — Progress Notes (Signed)
 Physical Therapy Session Note  Patient Details  Name: Edward Rodgers MRN: [id number] Date of Birth: 1950/12/14  Today's Date: 03/24/2023 PT Individual Time: 3086-5784, 6962-9528 PT Individual Time Calculation (min): 30 min, 48 min   Short Term Goals: Week 5:  PT Short Term Goal 1 (Week 5): STG=LTG 2/2 ELOS  Skilled Therapeutic Interventions/Progress Updates:      Pt seated in WC upon arrival. Pt agreeable to therapy. Pt denies any pain this morning.  Pt reports he had a fall last night after pt family left, while seated in WC. Pt reports he was reaching forward to pick up piece of paper  off of the floor, and fell forward. Pt reports he was on the phone with his wife prior to fall and she recommended he not try to pick it up, pt reports "I should have listened."   Education provided on importance of using reacher to pick up items off of floor. Pt reports he forgot about the reacher otherwise he would have used it. Pt placed reached directly next to pt on bed (same location as pt phone, and call bell.   Discussed pt seatbelt on WC. Pt reports he had his seatbelt alarm on, but doesn't remember if he still had the black WC seatbelt on. Education provided on importance of wearing black belt at all times as well as seatbelt alarm. Will emphasize during family training as well this afternoon.   Assessed bed mobility -pt performed bed mobility with supervision with use of bed rails, vebral cues provided for L UE inattention, and sequencing/positioning--of note: pt utilizing UE to lift L LE into bed today (pt typically able to lift leg without assist from UE.) Will assess further next session.   Pt performed stand pivot transfer WC to bed, bed to [facility] with heavy min A, verbal cues provided for technqiue and correction of pt attempting to sit prematurely.   Pt seated in WC with all needs within reach and WC seatbelt and seatbelt alarm on.   Treatment Session 2   Pt received on toilet with tech  upon arrival. Pt agreeable to therapy. Pt denies any pain. Pt wife and brother present for family training.   Therapist encouraging pt family to participate in hands on care throughout session. Yesterday when discussing with pt and pt family, all family agreeable to purchasing knock off steady on [place]. Throughout session today, pt and pt brother still advocating for it however pt wife resistant 2/2 financial concerns and reports she is going to get him home first and will try without it, but will purchase once home if need be. Pt wife expressed concerns that if he is using the steady, he will not want to walk.   Therapist provided education that PT is not recommending pt walk with family at home 2/2 pt requiring skilled mod A, and concerns for pt/caregiver safety especially in uncontrolled environment. Recommended pt continue gait training with follow up HHPT. Pt and pt brother agreeable. Pt family has questions regarding frequency of HHPT, therapist informed family HHPT will do eval, and determine frequency and duration from there.   Therapist provided extensive education on justification for therapy recommending use of steady, including increasing pt overall independence, reduce caregiver burden of care, increasing pt/caregiver overall safety, and  reducing pt and caregiver fall risk.   Initiated toilet training with use of steady (in case the family opts to use it--will defer toilet training without steady to OT). Education/demonstration provided for [employer], navigating steady. Pt  performed sit to stand with supervision, and donned/doffed pants with supervision/CGA with steady. Pt required assist for pericare for thoroughness. Pt wife and brother returned demonstration of transferring pt pt via steady BSC to WC.   Extensive education provided regarding impairments post stroke with emphasis on L UE/LE neglect, L visual field neglect, and L UE/LE hemiparesis. Education provided pt  requires 24/7 supervision for overall pt safety, with significant (min-mod A) for transfers/ADLs without the steady.   Extensive education provided regarding WC management including donning/doffing seat belt locking/unlocking brakes, donning/doffing leg rests, and donning and doffing arm rest. Emphasized importance of ensuring WC is locked prior to initiating any transfer. Emphasized improtance of utilizing WC seatbelt to reduce pt fall risk. Education provided on need for reacher to pick up items off of floor to reduce pt leaning forward and falling out of chair. Pt and pt family verbalized understanding and returned demonstration.   Initiated stand pivot transfer WC to car simulator, pt performed with therapist with min A, with max cues for positioning--emphasized improtance of L UE, B LE positioning. Pt performed stand pivot transfer with pt wife car simulator to [facility] with assist from therapist for safety 2/2 significant posterior bias. Pt wife reports feeling comfortable with transfer however therapist concerned for pt/caregiver safety. Encouraged family to attend additional family training--preferably Wednesday, Thursday and Friday to get more comfortable with hands on training and appropriate cuing. Pt wife reports she may be able to attend on Friday but otherwise "has things to do".   At end of session-pt family reports they are going downstairs to eat lunch. Therapist highly encouraged pt to stay for family training for SLP, OT, and nursing--beginning at 2:00. Pt wife reports lack of concern for attending SLP family training as pt wife under impression it is just related to ability to speak. Education provided on role of SLP in addressing cognition deficits, medication management, etc. Pt family reports they will return by 2.   Pt seated in WC with all needs within reach, WC seatbelt on and seatbelt alarm on.     Therapy Documentation Precautions:  Precautions Precautions:  Fall Precaution/Restrictions Comments: L-hemi, L-inattention, pusher syndrome, Loop recorder placed 2/7 Restrictions Weight Bearing Restrictions Per Provider Order: No  Therapy/Group: Individual Therapy  [facility] Ambrose Finland, [place], DPT  03/24/2023, 7:43 AM

## 2023-03-24 NOTE — Progress Notes (Signed)
 Physical Therapy Weekly Progress Note  Patient Details  Name: [name] MRN: [id number] Date of Birth: 07-29-1950  Beginning of progress report period: March 16, 2023 End of progress report period: March 24, 2023   Patient has met 3 of 3 short term goals.  Pt is completing bed mobility with supervision, sit to stand, and stand pivot transfer with min A with mod-max questioning cue provided for positioning/sequencing, and gait x 81 feet with RW and mod A with +2 for WC follow for safety.  Family training scheduled for 3/11 with pt wife and pt brother. Discharge scheduled for 3/15.  Patient continues to demonstrate the following deficits muscle weakness and muscle joint tightness, decreased cardiorespiratoy endurance, impaired timing and sequencing, abnormal tone, unbalanced muscle activation, ataxia, decreased coordination, and decreased motor planning, decreased midline orientation, decreased attention to left, left side neglect, and decreased motor planning, decreased attention, decreased awareness, decreased problem solving, decreased safety awareness, decreased memory, and delayed processing, and decreased sitting balance, decreased standing balance, decreased postural control, hemiplegia, and decreased balance strategies and therefore will continue to benefit from skilled PT intervention to increase functional independence with mobility.  Patient progressing toward long term goals..  Continue plan of care.  PT Short Term Goals Week 4:  PT Short Term Goal 1 (Week 4): pt will perform bed mobility with CGA or less consistently PT Short Term Goal 1 - Progress (Week 4): Met PT Short Term Goal 2 (Week 4): pt will perform stand pivot transfer consistently with mod A or less PT Short Term Goal 2 - Progress (Week 4): Met PT Short Term Goal 3 (Week 4): pt will ambulate with LRAD for neuromuscular reeducation PT Short Term Goal 3 - Progress (Week 4): Met Week 5:  PT Short Term Goal 1 (Week 5):  STG=LTG 2/2 ELOS  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 Restrictions Weight Bearing Restrictions Per Provider Order: No  Therapy/Group: Individual Therapy  [facility] Ambrose Finland, [place], DPT  03/24/2023, 7:36 AM

## 2023-03-24 NOTE — Progress Notes (Signed)
 Was the fall witnessed: unwitnessed  Patient condition before and after the fall: VSS  Patient's reaction to the fall: Calm  Name of the doctor that was notified including date and time: Riley Lam, 03/23/23, 2200  Any interventions and vital signs: vital signs and CT scan. Neuro checks

## 2023-03-24 NOTE — Progress Notes (Addendum)
 Patient ID: BRYDEN DARDEN, male   DOB: 24-May-1950, 73 y.o.   MRN: [id number]  Came to see how family training ws going and was told wife and his brother had gone to the cafeteria during the speech session. Hopefully they will be back for the OT session at 3:00. Will wait to talk to them. Will stress the importance of learning his care so prepared for discharge and it is a success once home. Pt is much care and not sure both wife and brother understand this  2;51 PM Have met with pt, brother and wife to stress the importance of being present for family training all sessions. Both feel they have been here and then reminded them they have not done hands on care until today. Are present for OT session and will come back Friday for PT session only from 1:00-2:00. Discussed equipment wife wants to wait on a hospital bed and will get bedside commode and brother will try to talk wife into getting a stedy. Wife is concerned about cost. She will let worker know if changes her mind once home regarding hospital bed. Will see again on Friday. Aware could only find home health PT & OT to service [place] no speech. Continue to work on discharge needs

## 2023-03-24 NOTE — Progress Notes (Signed)
 PROGRESS NOTE   Subjective/Complaints:  Pt with fall last night. Pt found prone on floor at 2000. Said he was reaching for something on floor despite having a reacher on his table. ("I forgot about it") Apparently no sequelae. CTH ordered. I asked pt how is night was, and he didn't even mention the incident.   ROS: Patient denies fever, rash, sore throat, blurred vision, dizziness, nausea, vomiting, diarrhea, cough, shortness of breath or chest pain, joint or back/neck pain, headache, or mood change.    Objective:   CT HEAD WO CONTRAST ( ) Result Date: 03/24/2023 CLINICAL DATA:  Fall EXAM: CT HEAD WITHOUT CONTRAST TECHNIQUE: Contiguous axial images were obtained from the base of the skull through the vertex without intravenous contrast. RADIATION DOSE REDUCTION: This exam was performed according to the departmental dose-optimization program which includes automated exposure control, adjustment of the mA and/or kV according to patient size and/or use of iterative reconstruction technique. COMPARISON:  03/01/2023 FINDINGS: Brain: Calcified anterior left parafalcine meningioma, 2.2 cm, unchanged. Hypodensity at the site of recent right periventricular white matter and right occipital cortical infarcts. No acute hemorrhage. Vascular: Atherosclerotic calcification of the internal carotid arteries at the skull base. No abnormal hyperdensity of the major intracranial arteries or dural venous sinuses. Skull: The visualized skull base, calvarium and extracranial soft tissues are normal. Sinuses/Orbits: No fluid levels or advanced mucosal thickening of the visualized paranasal sinuses. No mastoid or middle ear effusion. Normal orbits. Other: None. IMPRESSION: 1. No acute intracranial abnormality. 2. Hypodensity at the site of recent right periventricular white matter and right occipital cortical infarcts. 3. Unchanged calcified anterior left parafalcine  meningioma. Electronically Signed   By: Deatra Robinson M.D.   On: 03/24/2023 00:20     Recent Labs    03/23/23 0626  WBC 4.4  HGB 11.4*  HCT 36.7*  PLT 259      Recent Labs    03/21/23 0856 03/23/23 0626  NA 139 139  K 3.6 3.7  CL 107 104  CO2 25 28  GLUCOSE 95 92  BUN 14 16  CREATININE 1.19 1.03  CALCIUM 9.2 9.5        Intake/Output Summary (Last 24 hours) at 03/24/2023 0855 Last data filed at 03/23/2023 1300 Gross per 24 hour  Intake 480 ml  Output --  Net 480 ml         Physical Exam: Vital Signs Blood pressure 137/87, pulse 72, temperature 97.9 F (36.6 C), temperature source Oral, resp. rate 18, height 5\' 9"  (1.753 m), weight 91.5 kg, SpO2 98%.  Constitutional: No distress . Vital signs reviewed. HEENT: NCAT, EOMI, oral membranes moist Neck: supple Cardiovascular: RRR without murmur. No JVD    Respiratory/Chest: CTA Bilaterally without wheezes or rales. Normal effort    GI/Abdomen: BS +, non-tender, non-distended Ext: no clubbing, cyanosis, or edema Psych: pleasant and cooperative   Skin: No breakdown noted on visible portion    Neuro:     He is awake and alert.   Oriented to month, day, hospital, self. Sl slow processing.   Language overall appears to be intact.    RUE and RLE 4+/5 LLE 4/5 LUE 3 to 3+/4 prox  to distall. Mildly Increased tone L hand Mild sensory deficits left arm and leg.    Assessment/Plan: 1. Functional deficits which require 3+ hours per day of interdisciplinary therapy in a comprehensive inpatient rehab setting. Physiatrist is providing close team supervision and 24 hour management of active medical problems listed below. Physiatrist and rehab team continue to assess barriers to discharge/monitor patient progress toward functional and medical goals  Care Tool:  Bathing    Body parts bathed by patient: Left arm, Chest, Abdomen, Front perineal area, Right upper leg, Left upper leg, Right lower leg, Face   Body parts  bathed by helper: Right arm, Buttocks, Left lower leg     Bathing assist Assist Level: Moderate Assistance - Patient 50 - 74%     Upper Body Dressing/Undressing Upper body dressing        Upper body assist Assist Level: Moderate Assistance - Patient 50 - 74%    Lower Body Dressing/Undressing Lower body dressing            Lower body assist Assist for lower body dressing: Maximal Assistance - Patient 25 - 49%     Toileting Toileting    Toileting assist Assist for toileting: Moderate Assistance - Patient 50 - 74%     Transfers Chair/bed transfer  Transfers assist     Chair/bed transfer assist level: Minimal Assistance - Patient > 75%     Locomotion Ambulation   Ambulation assist   Ambulation activity did not occur: Safety/medical concerns  Assist level: 2 helpers (+2 for WC follow) Assistive device: Walker-rolling Max distance: 50 feet   Walk 10 feet activity   Assist  Walk 10 feet activity did not occur: Safety/medical concerns  Assist level: 2 helpers Assistive device: Walker-rolling   Walk 50 feet activity   Assist Walk 50 feet with 2 turns activity did not occur: Safety/medical concerns  Assist level: 2 helpers Assistive device: Walker-rolling    Walk 150 feet activity   Assist Walk 150 feet activity did not occur: Safety/medical concerns         Walk 10 feet on uneven surface  activity   Assist Walk 10 feet on uneven surfaces activity did not occur: Safety/medical concerns         Wheelchair     Assist Is the patient using a wheelchair?: Yes Type of Wheelchair: Manual    Wheelchair assist level: Minimal Assistance - Patient > 75% Max wheelchair distance: 50    Wheelchair 50 feet with 2 turns activity    Assist        Assist Level: Minimal Assistance - Patient > 75%   Wheelchair 150 feet activity     Assist      Assist Level: Maximal Assistance - Patient 25 - 49%   Blood pressure 137/87, pulse  72, temperature 97.9 F (36.6 C), temperature source Oral, resp. rate 18, height 5\' 9"  (1.753 m), weight 91.5 kg, SpO2 98%.  Medical Problem List and Plan: 1. 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  Await plan for loop recorder             -patient may shower -ELOS/Goals: 3/15 d  min assist with PT, OT and supervision with SLP   -grounds pass -Left WHO ordered -Continue CIR therapies including PT, OT, and SLP  -had fall last night. It doesn't appear that he suffered any injury , fortunately. We discussed safety awareness, using reacher he has on  his table tray. Probably lacks a little insight unfortunately, especially at night   2.  Antithrombotics: -DVT/anticoagulation:  Pharmaceutical: eliquis per pharmacy -03/07/23 now on eliquis per pharmacy -antiplatelet therapy: Aspirin 81 mg daily and Plavix 75 mg day x 3 weeks then Plavix alone -2/20 DC aspirin and Plavix as on heparin, discussed with neurology and cardiology-- 2/22 now on eliquis  3. Pain Management: Lidoderm patch as directed, Tylenol as needed 4. Mood/Sleep/sundowning: Effexor 150 mg every evening, provide emotional support             -antipsychotic agents: Seroquel 50 mg nighty at 2030, have back up dose             -keep sleep chart 5. Neuropsych/cognition: This patient is capable of making decisions on his own behalf. 6. Skin/Wound Care: Routine skin checks 7. Fluids/Electrolytes/Nutrition: Routine in and outs with follow-up chemistries, continue vitamins/supplements 8.  Hypothyroidism.  Synthroid 18mcg/day 9.  Hypertension.   -currently on toprol xl 100mg , losartan 25mg  in addition to norvasc below  -3/10- bp still running high--increase norvasc to 10mg  tomorrow -3/11 observe bp's today with increase in norvasc. May need further adjustment of losartan  Vitals:   03/23/23 2009 03/23/23 2030 03/23/23 2100 03/23/23 2130  BP: (!) 150/86  (!) 148/92 (!) 157/87 (!) 150/78   03/23/23 2204 03/23/23 2300 03/23/23 2349 03/24/23 0100  BP: (!) 132/92 137/87 128/87 (!) 144/87   03/24/23 0120 03/24/23 0322 03/24/23 0432 03/24/23 0600  BP: (!) 144/91 (!) 134/102 (!) 142/89 137/87    10.H/O prostate cancer. Myrbetriq 50 mg daily.  -3/5 occasional incontinence but improving 11.Hyperlipidemia. Lipitor 80mg  daily 12.Constipation.             -miralax daily, SenokotS 1 tab daily -02/22/23 LBM 2days ago, monitor today, if no BM tomorrow then may want to increase regimen -2/25 LBM 2/23, consider addtional medication if no bowel movement today -  LBM 3/11---large type 4 13.H/O gout. Zyloprim 300mg  daily 14.  AKI versus CKD  -Encourage oral fluid intake, decrease spironolactone to 12.5 mg   -3/10- Bn/Cr 16/1---continue to encourage fluids15.  History of CHF.  Last EF 55%, mild diastolic dysfunction  -Continue metoprolol, decrease spironolactone to 12.5 mg as above -2/14 Wts stable, has not appear to show signs of fluid overload, continue to monitor -02/28/23 wt up but doubt accuracy, monitor, no overload on exam -2/19-20 weight stable, does not appear to show signs of fluid overload, EF 40 to 45% on echo. -Cardiology has been following, they discontinued amlodipine and started losartan 25 mg daily-beginning tomorrow-- signing off 2/22 (see recs below)  -Follow-up with [facility]ology at the [facility] postdischarge  -2/27 weight is overall stable, continue to monitor  -2/28 restart losartan  -3/4-5 weight trending a little up, no signs of fluid overload at this time  -3/10--weight trending up, 7L up for admit--may need to resume aldactone if weight goes up any further  3/11-weight up further today. Resume dose of spironolactone 12.5mg  every day  Filed Weights   03/22/23 0500 03/23/23 0500 03/24/23 0500  Weight: 90.4 kg 91.2 kg 91.5 kg    16.  Severe cervical stenosis: Consider neurosurgery outpatient follow-up 17. Insomnia -Patient has Seroquel 50  mg scheduled and 25 mg as needed.  Will also add in 3 mg melatonin at bedtime  -2/11 increase Seroquel to 75 mg nightly  -2/13 asked nursing to complete sleep-wake chart  -2/14 reports sleeping better, continue current regimen for now and monitor  -2/20 will decrease Seroquel back  to 50 mg nightly and 25 mg at bedtime  2/21 Decrease seroquel to 25mg  nightly and 25mg  PRN  2/25 increase Seroquel scheduled back to 50 nightly and 25 as needed  -2/28-3/11 reports he continues to sleep well, continue current regimen  18.  Bleeding from his penis  -UA neg,HGB was stable on 2/11 -Denies additional occurrences.  During team meeting was noted this happened intermittently during acute hospital stay  19. Nasal congestion: Flonase 1 spray daily  20. Staring episodes/vision changes 03/01/23 -03/01/23 having staring episodes and ?chronic vision changes/hemianopsia? Spoke with Dr. Derry Lory of neuro, will get 24hr EEG and head CT, no code stroke at this point; they will see pt, appreciate their assistance.  -2/17 overnight EEG neg, CT head without acute changes  21. Chest pain  -EKG, Trop, CXR ordered -Cardiology following appreciate assistance.  D-dimer positive, getting CT PE study to rule out PE.  If positive will need to discuss with neurology regarding anticoagulation.  He is now on 2 L nasal cannula oxygen  -2/20 patient reports his chest pain is resolved  22. Pulmonary embolism / DVT left popliteal vein -2/19 discussed with neurology and cardiology. Heparin grip started, wife updated by phone. Critical care medicine consulted 2/20 seen by critical care, continue heparin eventual transition to DOAC, if hemodynamic changes contact PCCM again. Advised 24h bedrest with [facility] 2/21 plan to transition to DOAC later today, discussed with pharmacy by phone  -03/07/23 cardiology saw pt, recommend eliquis per protolocol, continue toprol/low dose losartan, and they signed off-- f/up in 3-4wks with Dr. Odis Hollingshead  23.  Leukocytosis: 13k today -SLP felt that he was having more difficulties from a cognitive standpoint today  -he is afebrile. No cough or respiratory sx  -will check UA, UCX- UA does not indicate infection  -recheck CBC in AM  -CXR yesterday with pulmonary vascular congestion  -WBC WNL   24. Mild hyponatremia, mild continue to monitor   -3/6 stable at 136  -03/23/23 stable at 139  LOS: 31 days A FACE TO FACE EVALUATION WAS PERFORMED  Ranelle Oyster 03/24/2023, 8:55 AM

## 2023-03-24 NOTE — Progress Notes (Addendum)
 A report from the tech was received that the pt fell. I was on another patient's room doing patient care and needs to wrap up. Upon arrival, vital signs taken, pt assessed. VSS and unremarkable assessment found.

## 2023-03-24 NOTE — Progress Notes (Signed)
   03/23/23 2009  What Happened  Was fall witnessed? No  Was patient injured? No  Patient found on floor  Found by Staff-comment (cherish)  Stated prior activity to/from bed, chair, or stretcher  Provider Notification  Provider Name/Title Riley Lam  Date Provider Notified 03/23/23  Time Provider Notified 2200  Method of Notification Call  Notification Reason Fall  Provider response See new orders (ct scan)  Date of Provider Response 03/23/23  Time of Provider Response 2215  Follow Up  Family notified Yes - comment  Time family notified 2200  Additional tests Yes-comment (ct scan)  Progress note created (see row info) Yes  Blank note created Yes  Adult Fall Risk Assessment  Risk Factor Category (scoring not indicated) History of more than one fall within 6 months before admission (document High fall risk)  Age 73  Fall History: Fall within 6 months prior to admission 5  Elimination; Bowel and/or Urine Incontinence 0  Elimination; Bowel and/or Urine Urgency/Frequency 0  Medications: includes PCA/Opiates, Anti-convulsants, Anti-hypertensives, Diuretics, Hypnotics, Laxatives, Sedatives, and Psychotropics 5  Patient Care Equipment 1  Mobility-Assistance 2  Mobility-Gait 2  Mobility-Sensory Deficit 2  Altered awareness of immediate physical environment 1  Impulsiveness 0  Lack of understanding of one's physical/cognitive limitations 4  Total Score 24  Adult Fall Risk Interventions  Required Bundle Interventions *See Row Information* High fall risk - low, moderate, and high requirements implemented  Additional Interventions Use of appropriate toileting equipment (bedpan, BSC, etc.)  Fall intervention(s) refused/Patient educated regarding refusal Bed alarm;Nonskid socks;Open door if unsupervised;Yellow bracelet;Supervision while toileting/edge of bed sitting  Screening for Fall Injury Risk (To be completed on HIGH fall risk patients) - Assessing Need for Floor Mats  Risk For Fall  Injury- Criteria for Floor Mats Previous fall this admission  Will Implement Floor Mats Yes

## 2023-03-24 NOTE — Progress Notes (Signed)
 Occupational Therapy Session Note  Patient Details  Name: Edward Rodgers MRN: [id number] Date of Birth: Sep 23, 1950  Today's Date: 03/24/2023 OT Individual Time: 9528-4132 OT Individual Time Calculation (min): 56 min   Today's Date: 03/24/2023 OT Individual Time: 1440-1547 OT Individual Time Calculation (min): 67 min   Short Term Goals: Week 5:  OT Short Term Goal 1 (Week 5): STGs=LTGs due to patient's estimated length of stay.  Skilled Therapeutic Interventions/Progress Updates:   Session 1: Pt received resting in bed, increased drowsiness this AM session, but no reports of pain. Pt transitions to EOB with HOB elevated and overall heavy Mod A for LLE management and trunk elevation. Stedy used this session for time management for bathing at shower level. Pt performs sit<>stands using stedy with CGA-light Min A (with fatigue), mod cuing for integration of LUE into movements. Pt completes full-body bathing with A for B lower legs (due to fear of falling) would benefit from long-handled sponge and thoroughness of RUE bathing.HOH/proximal support provided for sustained activation of LUE to integrate into bathing tasks. Pt dons LB garments with Mod A overall for assistance with threading (due to material) and hiking over L-side of bottom, in standing with use of stedy and close supervision. Mod A provided for long-sleeve management due to time constraints (does recall need to thread LUE first), doffing with supervision. Max A for footwear management. Pt remained sitting in WC, WC seat-belt donned, posey belt activated, door open and NT made aware of patient location.   Session 2: Pt received sitting in [facility] with spouse and brother present for caregiver education. Focus placed on OT role, OT POC, and current patient functioning. Stand-pivot transfers from EOB<>WC & EOB<>BSC demos and spouse provides teach-back with intermediate min A from OT and mod-max verbal cuing for technique/safety with transfer.  Toileting reviewed in absence of AD/stedy (per request of spouse), activities simulated at Max A level due to balance deficits. Spouse then more receptive to idea of stedy, therefore toilet transfer and 3/3 toileting activities reviewed with patient performing at CGA-light Min A utilizing stedy for balance deficits. Spouse receiving phone calls intermediately throughout session creating increased distraction. OT needing to encourage active participation in education session (total of 60 mins) as wife observed wanting to sit intermediately, asking brother to assist with transfers instead, and looking at clock, anticipate most due to fatigue, OT providing encouragement by relating session to "a boot-camp" for patient's care.  Educated that patient will not be able to access tub/shower at this time and will need to sponge-bathe, most safe at bed-level, but reviewed process at EOB. Spouse with no concerns as she has been assisting him intermediately throughout stay. Remainder of ADL routine verbally reviewed and OT simulating patient's current functioning level (due to time and patient fatigue). Encouraged allowing increased time for the above for optimal patient independence.   At end of session, all parties in agreement to purchase stedy for increased independence and decreased caregiver burden. Educated on need for patient to have WC seat belt and B leg rests due to high fall risk.   Plan for continued caregiver education at end of week before Sat discharge.   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 Restrictions Weight Bearing Restrictions Per Provider Order: No   Therapy/Group: Individual Therapy  Lou Cal, OTR/L, MSOT  03/24/2023, 6:16 AM

## 2023-03-24 NOTE — Progress Notes (Signed)
 Speech Language Pathology Daily Session Note  Patient Details  Name: Edward Rodgers MRN: [id number] Date of Birth: 1950-07-30  Today's Date: 03/24/2023 SLP Individual Time: 1400-1430 SLP Individual Time Calculation (min): 30 min  Short Term Goals: Week 5: SLP Short Term Goal 1 (Week 5): STGs=LTGs d/t ELOS  Skilled Therapeutic Interventions: SLP conducted skilled therapy session targeting cognitive retraining goals and family education. Family not present for first half of session but entered for final 15 minutes. Patient is increasingly aware of deficits and demonstrates improving safety awareness, requiring supervision to min assist to state deficits and anticipated challenges upon discharge. When family entered, SLP stressed the importance of obtaining PT recommended items such as ramp and Stedy for transfers. Family continues to be hesitant, and SLP highlighted likely safety hazards without these items including falls, decreased patient access to necessary spaces, and caregiver injury. During discussion of medication management, family required extensive eduction re: importance of carrying over medication recommendations from this admission upon discharge and had questions re: if they should follow these medication instructions or go back to all old medications immediately and/or stop medications until patient sees his primary care doctor. SLP recommended family follow discharge instructions as presented on discharge day. Patient was left in chair with call bell in reach and chair alarm set. SLP will continue to target goals per plan of care.       Pain Pain Assessment Pain Scale: 0-10 Pain Score: 0-No pain  Therapy/Group: Individual Therapy  [name], M.A., CCC-SLP  [name] 03/24/2023, 4:00 PM

## 2023-03-25 NOTE — Progress Notes (Signed)
 Physical Therapy Session Note  Patient Details  Name: Edward Rodgers MRN: [id number] Date of Birth: 05-09-1950  Today's Date: 03/25/2023 PT Individual Time: 0900-1014 PT Individual Time Calculation (min): 74 min   Short Term Goals: Week 5:  PT Short Term Goal 1 (Week 5): STG=LTG 2/2 ELOS  Skilled Therapeutic Interventions/Progress Updates:      Pt supine in bed asleep upon arrival. Pt awoken and agreeable to therapy. Pt denies any pain, but reports being sleepy today.   Pt requesting to use bathroom. Utilized steady 2/2 urgency. Pt performed sit to stand with steady and supervision, verbal cues provided for L UE/L LE positioning.   Pt continent of bladder with mild urinary incontinence with urgency. Changed brief while seated with total A for time/conservation.   Prior to leaving room, questioned pt what all pt needs to have regarding WC with emphasis on seatbelt, leg rest, arm rests. Pt able to provide teach back of this throughout session after performing any transfer to [facility].   Pt performed stand pivot transfer WC to apartment bed with min A. Pt performed bed mobility with supervision. Pt wife provided home measurement sheet yesterday however did not provide information regarding pt bed. Pt reports his bed at home is a king but is high and required step stool to get in. Notified Child psychotherapist of concerns as pt not safe at this time to use step stool. Social worker and pt confirm pt wife says there is an additional queen size bed that is similar height to rehab apartment. Discussed with pt and social worker-may be a good idea to purchase bed rails for safety at night. Pt verbalized understanding. Social worker will notify family.   Pt transported dependent in [facility] to gift shop for time/energy conservation.   Pt self propelled WC with hemi technique throughout gift shop, navigating obstacles to simulate home enviorment. Pt performed with supervision with mod instructional cues for scanning to  R and L for locating items and negotiating obstacles on L side. Without cues, pt runs into obstacles on L side. Education provided to reduce as much clutter as possible. Pt overall articulates improved awareness of why it is unsafe for him to drive a car.   Pt self propelled WC up/down ramp with hemi propulsion technique with mod A to get over initial threshold, and min A otherwise.    Pt seated in WC with all needs within reach, seat belt, and seatbelt alarm on.   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 Restrictions Weight Bearing Restrictions Per Provider Order: No  Therapy/Group: Individual Therapy  [facility] Ambrose Finland, [place], DPT  03/25/2023, 7:48 AM

## 2023-03-25 NOTE — Progress Notes (Signed)
 PROGRESS NOTE   Subjective/Complaints:  Pt denies any issues overnight. No pain. Does report occasional cramping in left arm and hand. Moved bowels yesterday  ROS: Patient denies fever, rash, sore throat, blurred vision, dizziness, nausea, vomiting, diarrhea, cough, shortness of breath or chest pain, joint or back/neck pain, headache, or mood change.    Objective:   CT HEAD WO CONTRAST ( ) Result Date: 03/24/2023 CLINICAL DATA:  Fall EXAM: CT HEAD WITHOUT CONTRAST TECHNIQUE: Contiguous axial images were obtained from the base of the skull through the vertex without intravenous contrast. RADIATION DOSE REDUCTION: This exam was performed according to the departmental dose-optimization program which includes automated exposure control, adjustment of the mA and/or kV according to patient size and/or use of iterative reconstruction technique. COMPARISON:  03/01/2023 FINDINGS: Brain: Calcified anterior left parafalcine meningioma, 2.2 cm, unchanged. Hypodensity at the site of recent right periventricular white matter and right occipital cortical infarcts. No acute hemorrhage. Vascular: Atherosclerotic calcification of the internal carotid arteries at the skull base. No abnormal hyperdensity of the major intracranial arteries or dural venous sinuses. Skull: The visualized skull base, calvarium and extracranial soft tissues are normal. Sinuses/Orbits: No fluid levels or advanced mucosal thickening of the visualized paranasal sinuses. No mastoid or middle ear effusion. Normal orbits. Other: None. IMPRESSION: 1. No acute intracranial abnormality. 2. Hypodensity at the site of recent right periventricular white matter and right occipital cortical infarcts. 3. Unchanged calcified anterior left parafalcine meningioma. Electronically Signed   By: Deatra Robinson M.D.   On: 03/24/2023 00:20     Recent Labs    03/23/23 0626  WBC 4.4  HGB 11.4*  HCT 36.7*   PLT 259      Recent Labs    03/23/23 0626  NA 139  K 3.7  CL 104  CO2 28  GLUCOSE 92  BUN 16  CREATININE 1.03  CALCIUM 9.5        Intake/Output Summary (Last 24 hours) at 03/25/2023 0801 Last data filed at 03/25/2023 0516 Gross per 24 hour  Intake 952 ml  Output 250 ml  Net 702 ml         Physical Exam: Vital Signs Blood pressure (!) 148/91, pulse 77, temperature 97.7 F (36.5 C), temperature source Oral, resp. rate 18, height 5\' 9"  (1.753 m), weight 88.9 kg, SpO2 96%.  Constitutional: No distress . Vital signs reviewed. HEENT: NCAT, EOMI, oral membranes moist Neck: supple Cardiovascular: RRR without murmur. No JVD    Respiratory/Chest: CTA Bilaterally without wheezes or rales. Normal effort    GI/Abdomen: BS +, non-tender, non-distended Ext: no clubbing, cyanosis, tr edema LUE Psych: pleasant and cooperative  Skin: No breakdown noted on visible portion    Neuro:     He is awake and alert.   Oriented to month, day, hospital, self. Sl slow processing.   Language overall appears to be intact.    RUE and RLE 4+/5 LLE 4/5 LUE 3 to 3+/4 prox to distall. Minimal tone L hand Mild sensory deficits left arm and leg.    Assessment/Plan: 1. Functional deficits which require 3+ hours per day of interdisciplinary therapy in a comprehensive inpatient rehab setting. Physiatrist is providing close team  supervision and 24 hour management of active medical problems listed below. Physiatrist and rehab team continue to assess barriers to discharge/monitor patient progress toward functional and medical goals  Care Tool:  Bathing    Body parts bathed by patient: Left arm, Chest, Abdomen, Front perineal area, Right upper leg, Left upper leg, Right lower leg, Face   Body parts bathed by helper: Right arm, Buttocks, Left lower leg     Bathing assist Assist Level: Moderate Assistance - Patient 50 - 74%     Upper Body Dressing/Undressing Upper body dressing        Upper  body assist Assist Level: Moderate Assistance - Patient 50 - 74%    Lower Body Dressing/Undressing Lower body dressing            Lower body assist Assist for lower body dressing: Maximal Assistance - Patient 25 - 49%     Toileting Toileting    Toileting assist Assist for toileting: Moderate Assistance - Patient 50 - 74%     Transfers Chair/bed transfer  Transfers assist     Chair/bed transfer assist level: Minimal Assistance - Patient > 75%     Locomotion Ambulation   Ambulation assist   Ambulation activity did not occur: Safety/medical concerns  Assist level: 2 helpers (+2 for WC follow) Assistive device: Walker-rolling Max distance: 50 feet   Walk 10 feet activity   Assist  Walk 10 feet activity did not occur: Safety/medical concerns  Assist level: 2 helpers Assistive device: Walker-rolling   Walk 50 feet activity   Assist Walk 50 feet with 2 turns activity did not occur: Safety/medical concerns  Assist level: 2 helpers Assistive device: Walker-rolling    Walk 150 feet activity   Assist Walk 150 feet activity did not occur: Safety/medical concerns         Walk 10 feet on uneven surface  activity   Assist Walk 10 feet on uneven surfaces activity did not occur: Safety/medical concerns         Wheelchair     Assist Is the patient using a wheelchair?: Yes Type of Wheelchair: Manual    Wheelchair assist level: Minimal Assistance - Patient > 75% Max wheelchair distance: 50    Wheelchair 50 feet with 2 turns activity    Assist        Assist Level: Minimal Assistance - Patient > 75%   Wheelchair 150 feet activity     Assist      Assist Level: Maximal Assistance - Patient 25 - 49%   Blood pressure (!) 148/91, pulse 77, temperature 97.7 F (36.5 C), temperature source Oral, resp. rate 18, height 5\' 9"  (1.753 m), weight 88.9 kg, SpO2 96%.  Medical Problem List and Plan: 1. 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  Await plan for loop recorder             -patient may shower -ELOS/Goals: 3/15 d  min assist with PT, OT and supervision with SLP   -grounds pass -Left WHO ordered -Continue CIR therapies including PT, OT, and SLP    2.  Antithrombotics: -DVT/anticoagulation:  Pharmaceutical: eliquis per pharmacy -03/07/23 now on eliquis per pharmacy -antiplatelet therapy: Aspirin 81 mg daily and Plavix 75 mg day x 3 weeks then Plavix alone -2/20 DC aspirin and Plavix as on heparin, discussed with neurology and cardiology-- 2/22 now on eliquis  3. Pain Management: Lidoderm patch as directed, Tylenol as needed 4.  Mood/Sleep/sundowning: Effexor 150 mg every evening, provide emotional support             -antipsychotic agents: Seroquel 50 mg nighty at 2030, have back up dose             -keep sleep chart 5. Neuropsych/cognition: This patient is capable of making decisions on his own behalf. 6. Skin/Wound Care: Routine skin checks 7. Fluids/Electrolytes/Nutrition: Routine in and outs with follow-up chemistries, continue vitamins/supplements 8.  Hypothyroidism.  Synthroid 53mcg/day 9.  Hypertension.   -currently on toprol xl 100mg , losartan 25mg  in addition to norvasc below  -3/12 bp's a little better with norvasc 10mg . Will hold on losartan increase for now  Vitals:   03/23/23 2130 03/23/23 2204 03/23/23 2300 03/23/23 2349  BP: (!) 150/78 (!) 132/92 137/87 128/87   03/24/23 0100 03/24/23 0120 03/24/23 0322 03/24/23 0432  BP: (!) 144/87 (!) 144/91 (!) 134/102 (!) 142/89   03/24/23 0600 03/24/23 1235 03/24/23 2037 03/25/23 0507  BP: 137/87 (!) 138/91 (!) 147/89 (!) 148/91    10.H/O prostate cancer. Myrbetriq 50 mg daily.  -3/5 occasional incontinence but improving 11.Hyperlipidemia. Lipitor 80mg  daily 12.Constipation.             -miralax daily, SenokotS 1 tab daily -02/22/23 LBM 2days ago, monitor today, if no BM  tomorrow then may want to increase regimen -2/25 LBM 2/23, consider addtional medication if no bowel movement today -  LBM 3/11---large type 4 13.H/O gout. Zyloprim 300mg  daily 14.  AKI versus CKD  -Encourage oral fluid intake, decrease spironolactone to 12.5 mg   -3/10- Bn/Cr 16/1---continue to encourage fluids15.  History of CHF.  Last EF 55%, mild diastolic dysfunction  -Continue metoprolol, decrease spironolactone to 12.5 mg as above -2/14 Wts stable, has not appear to show signs of fluid overload, continue to monitor -02/28/23 wt up but doubt accuracy, monitor, no overload on exam -2/19-20 weight stable, does not appear to show signs of fluid overload, EF 40 to 45% on echo. -Cardiology has been following, they discontinued amlodipine and started losartan 25 mg daily-beginning tomorrow-- signing off 2/22 (see recs below)  -Follow-up with cardiology at the [facility] post[facility]charge  -2/27 weight is overall stable, continue to monitor  -2/28 restart losartan  -3/4-5 weight trending a little up, no signs of fluid overload at this time  -3/10--weight trending up, 7L up for admit--may need to resume aldactone if weight goes up any further  3/12 weight back down today to 88.9 -continue spironolactone 12.5mg  every day  Filed Weights   03/23/23 0500 03/24/23 0500 03/25/23 0500  Weight: 91.2 kg 91.5 kg 88.9 kg    16.  Severe cervical stenosis: Consider neurosurgery outpatient follow-up 17. Insomnia -Patient has Seroquel 50 mg scheduled and 25 mg as needed.  Will also add in 3 mg melatonin at bedtime  -2/11 increase Seroquel to 75 mg nightly  -2/13 asked nursing to complete sleep-wake chart  -2/14 reports sleeping better, continue current regimen for now and monitor  -2/20 will decrease Seroquel back to 50 mg nightly and 25 mg at bedtime  2/21 Decrease seroquel to 25mg  nightly and 25mg  PRN  2/25 increase Seroquel scheduled back to 50 nightly and 25 as needed  - continues to sleep well, continue  current regimen  18.  Bleeding from his penis  -UA neg,HGB was stable on 2/11 -Denies additional occurrences.  During team meeting was noted this happened intermittently during acute hospital stay  19. Nasal congestion: Flonase 1 spray daily  20. Staring  episodes/vision changes 03/01/23 -03/01/23 having staring episodes and ?chronic vision changes/hemianopsia? Spoke with Dr. Derry Lory of neuro, will get 24hr EEG and head CT, no code stroke at this point; they will see pt, appreciate their assistance.  -2/17 overnight EEG neg, CT head without acute changes  21. Chest pain  -EKG, Trop, CXR ordered -Cardiology following appreciate assistance.  D-dimer positive, getting CT PE study to rule out PE.  If positive will need to discuss with neurology regarding anticoagulation.  He is now on 2 L nasal cannula oxygen  -2/20 patient reports his chest pain is resolved  22. Pulmonary embolism / DVT left popliteal vein -2/19 discussed with neurology and cardiology. Heparin grip started, wife updated by phone. Critical care medicine consulted 2/20 seen by critical care, continue heparin eventual transition to DOAC, if hemodynamic changes contact PCCM again. Advised 24h bedrest with [facility] 2/21 plan to transition to DOAC later today, discussed with pharmacy by phone  -03/07/23 cardiology saw pt, recommend eliquis per protolocol, continue toprol/low dose losartan, and they signed off-- f/up in 3-4wks with Dr. Odis Hollingshead  23. Leukocytosis: resolved 24. Mild hyponatremia, mild continue to monitor   -3/6 stable at 136  -03/23/23 stable at 139  LOS: 32 days A FACE TO FACE EVALUATION WAS PERFORMED  Ranelle Oyster 03/25/2023, 8:01 AM

## 2023-03-25 NOTE — Progress Notes (Signed)
 Speech Language Pathology Daily Session Note  Patient Details  Name: Edward Rodgers MRN: [id number] Date of Birth: 1950-09-20  Today's Date: 03/25/2023 SLP Individual Time: 4098-1191 SLP Individual Time Calculation (min): 56 min  Short Term Goals: Week 5: SLP Short Term Goal 1 (Week 5): STGs=LTGs d/t ELOS  Skilled Therapeutic Interventions: SLP conducted skilled therapy session targeting cognitive retraining goals. Patient agreeable to therapy tasks and transported to speech therapy office via wheelchair. Facilitated medication management tasks, as SLP suspects patient will be assisting with medication management at home based on discussions had with wife during family education session. SLP encouraged family to assist and complete medication management tasks alongside/for patient given cognitive deficits, however unsure what will transpire given conversation. SLP reviewed all current medications with patient. Patient recalled one medication from baseline and its purpose with supervision, but required max verbal cues to identify remainder of medications and their purposes. SLP then facilitated hands-on medication organization trial with TID pill box and basic to mildly complex medication instructions. Patient required min to mod assist and full set up throughout to accurately organize all medications and identify/correct errors. Patient was left in chair with call bell in reach and chair alarm set. SLP will continue to target goals per plan of care.       Pain Pain Assessment Pain Scale: 0-10 Pain Score: 0-No pain  Therapy/Group: Individual Therapy  [name], M.A., CCC-SLP  [name] 03/25/2023, 3:35 PM

## 2023-03-25 NOTE — Progress Notes (Signed)
 Occupational Therapy Session Note  Patient Details  Name: TREI SCHOCH MRN: [id number] Date of Birth: Jan 23, 1950  {CHL IP REHAB OT TIME CALCULATIONS:[id number]}   Short Term Goals: Week 4:  OT Short Term Goal 1 (Week 4): Pt will perform BSC transfer with consitent Min A + LRAD. OT Short Term Goal 1 - Progress (Week 4): Progressing toward goal OT Short Term Goal 2 (Week 4): Pt will perform LB dressing with consistent Mod A + LRAD. OT Short Term Goal 2 - Progress (Week 4): Progressing toward goal OT Short Term Goal 3 (Week 4): Pt will perform 2/3 toileting activities with Min A + LRAD. OT Short Term Goal 3 - Progress (Week 4): Progressing toward goal  Skilled Therapeutic Interventions/Progress Updates:    Patient agreeable to participate in OT session. Reports *** pain level.   Patient participated in skilled OT session focusing on ***. Therapist facilitated/assessed/developed/educated/integrated/elicited *** in order to improve/facilitate/promote    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 Restrictions Weight Bearing Restrictions Per Provider Order: No  Therapy/Group: Individual Therapy  Limmie Patricia, OTR/L,CBIS  Supplemental OT - [facility] and [facility] Secure Chat Preferred   03/25/2023, 10:10 PM

## 2023-03-25 NOTE — Progress Notes (Signed)
 Occupational Therapy Session Note  Patient Details  Name: Edward Rodgers MRN: [id number] Date of Birth: 12-23-1950  Today's Date: 03/25/2023 OT Individual Time: 1105-1200 OT Individual Time Calculation (min): 55 min   Short Term Goals: Week 5:  OT Short Term Goal 1 (Week 5): STGs=LTGs due to patient's estimated length of stay.  Skilled Therapeutic Interventions/Progress Updates:  Pt greeted sitting in [facility] for skilled OT session with focus on LUE NMR.   Pain: Pt with no reports of pain, OT offering intermediate rest breaks and positioning suggestions throughout session to address potential pain/fatigue and maximize participation/safety in session.   Pt dependently transported to/from main therapy gym for time management.  With use of hand skate and washcloth to decrease friction, pt instructed in series of table top glides targeting:  Shoulder and Elbow Flexion/Extension Shoulder Abduction/Adduction Shoulder Internal/External Rotation Circumduction  Visual targets provided for improved motor planning as well as support at elbow/shoulder to promote improved mechanics. 1x6min of each. Intermediate assistance provided for sustained activation to reach end range.   Pt also performs elbow flexion/extension (to touch his nose) 1x8, and shoulder abduction squeezing sponge 1x5.   Pt remained seated in WC with seat belt donned, posey belt activated, all needs within reach.   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 Restrictions Weight Bearing Restrictions Per Provider Order: No   Therapy/Group: Individual Therapy  Lou Cal, OTR/L, MSOT  03/25/2023, 5:39 AM

## 2023-03-26 ENCOUNTER — Other Ambulatory Visit (HOSPITAL_COMMUNITY): Payer: Self-pay

## 2023-03-26 LAB — BASIC METABOLIC PANEL
Anion gap: 7 (ref 5–15)
BUN: 18 mg/dL (ref 8–23)
CO2: 28 mmol/L (ref 22–32)
Calcium: 9.3 mg/dL (ref 8.9–10.3)
Chloride: 101 mmol/L (ref 98–111)
Creatinine, Ser: 1 mg/dL (ref 0.61–1.24)
GFR, Estimated: >60 mL/min (ref >60)
Glucose, Bld: 93 mg/dL (ref 70–99)
Potassium: 4 mmol/L (ref 3.5–5.1)
Sodium: 136 mmol/L (ref 135–145)

## 2023-03-26 MED ORDER — AMLODIPINE BESYLATE 10 MG PO TABS
10.0000 mg | ORAL_TABLET | Freq: Every day | ORAL | 0 refills | Status: AC
  Filled 2023-03-26: qty 30, 30d supply, fill #0

## 2023-03-26 MED ORDER — SPIRONOLACTONE 25 MG PO TABS
12.5000 mg | ORAL_TABLET | Freq: Every day | ORAL | 0 refills | Status: AC
  Filled 2023-03-26: qty 15, 30d supply, fill #0

## 2023-03-26 MED ORDER — LOSARTAN POTASSIUM 50 MG PO TABS
50.0000 mg | ORAL_TABLET | Freq: Every day | ORAL | 0 refills | Status: DC
  Filled 2023-03-26: qty 30, 30d supply, fill #0

## 2023-03-26 MED ORDER — MELATONIN 3 MG PO TABS
3.0000 mg | ORAL_TABLET | Freq: Every day | ORAL | 0 refills | Status: DC
  Filled 2023-03-26: qty 30, 30d supply, fill #0

## 2023-03-26 MED ORDER — APIXABAN 5 MG PO TABS
5.0000 mg | ORAL_TABLET | Freq: Two times a day (BID) | ORAL | 0 refills | Status: DC
  Filled 2023-03-26: qty 60, 30d supply, fill #0

## 2023-03-26 MED ORDER — LOSARTAN POTASSIUM 50 MG PO TABS
50.0000 mg | ORAL_TABLET | Freq: Every day | ORAL | Status: DC
  Administered 2023-03-27 – 2023-03-28 (×2): 50 mg via ORAL
  Filled 2023-03-26 (×2): qty 1

## 2023-03-26 MED ORDER — LEVOTHYROXINE SODIUM 88 MCG PO TABS
88.0000 ug | ORAL_TABLET | Freq: Every day | ORAL | 0 refills | Status: AC
  Filled 2023-03-26: qty 30, 30d supply, fill #0

## 2023-03-26 MED ORDER — ATORVASTATIN CALCIUM 80 MG PO TABS
80.0000 mg | ORAL_TABLET | Freq: Every day | ORAL | 0 refills | Status: AC
  Filled 2023-03-26: qty 30, 30d supply, fill #0

## 2023-03-26 MED ORDER — MIRABEGRON ER 50 MG PO TB24
50.0000 mg | ORAL_TABLET | Freq: Every day | ORAL | 0 refills | Status: DC
  Filled 2023-03-26 (×2): qty 30, 30d supply, fill #0

## 2023-03-26 MED ORDER — QUETIAPINE FUMARATE 50 MG PO TABS
50.0000 mg | ORAL_TABLET | Freq: Every day | ORAL | 0 refills | Status: DC
  Filled 2023-03-26: qty 30, 30d supply, fill #0

## 2023-03-26 MED ORDER — FLUTICASONE PROPIONATE 50 MCG/ACT NA SUSP: 1.0000 | Freq: Every day | NASAL | Status: AC

## 2023-03-26 MED ORDER — METOPROLOL SUCCINATE ER 100 MG PO TB24
100.0000 mg | ORAL_TABLET | Freq: Every day | ORAL | 0 refills | Status: AC
  Filled 2023-03-26: qty 30, 30d supply, fill #0

## 2023-03-26 MED ORDER — LIDOCAINE 5 % EX PTCH
1.0000 | MEDICATED_PATCH | CUTANEOUS | 1 refills | Status: DC
  Filled 2023-03-26 (×2): qty 14, 14d supply, fill #0

## 2023-03-26 MED ORDER — VENLAFAXINE HCL ER 150 MG PO CP24
150.0000 mg | ORAL_CAPSULE | Freq: Every evening | ORAL | 0 refills | Status: AC
  Filled 2023-03-26: qty 30, 30d supply, fill #0

## 2023-03-26 NOTE — Plan of Care (Signed)
  Problem: RH Balance Goal: LTG Patient will maintain dynamic sitting balance (PT) Description: LTG:  Patient will maintain dynamic sitting balance with assistance during mobility activities (PT) Flowsheets (Taken 03/26/2023 0826) LTG: Pt will maintain dynamic sitting balance during mobility activities with:: (downgraded for safety) Contact Guard/Touching assist

## 2023-03-26 NOTE — Progress Notes (Signed)
 Occupational Therapy Session Note  Patient Details  Name: TEL HEVIA MRN: [id number] Date of Birth: [birth year]  {CHL IP REHAB OT TIME CALCULATIONS:[id number]}   Short Term Goals: Week 5:  OT Short Term Goal 1 (Week 5): STGs=LTGs due to patient's estimated length of stay.  Skilled Therapeutic Interventions/Progress Updates:    Patient agreeable to participate in OT session. Reports *** pain level.   Patient participated in skilled OT session focusing on ***. Therapist facilitated/assessed/developed/educated/integrated/elicited *** in order to improve/facilitate/promote    Therapy Documentation Precautions:  Precautions Precautions: Fall Precaution/Restrictions Comments: L-hemi, L-inattention, decreased midline orientation Restrictions Weight Bearing Restrictions Per Provider Order: No  Therapy/Group: Individual Therapy  Limmie Patricia, OTR/L,CBIS  Supplemental OT - [facility] Secure Chat Preferred   03/26/2023, 11:49 PM

## 2023-03-26 NOTE — Progress Notes (Signed)
 Occupational Therapy Discharge Summary  Patient Details  Name: Edward Rodgers MRN: [id number] Date of Birth: 24-Dec-1950  Date of Discharge from OT service:March 27, 2023   Patient has met 11 of 11 long term goals due to improved activity tolerance, improved balance, postural control, ability to compensate for deficits, functional use of  LEFT upper and LEFT lower extremity, improved attention, and improved awareness. Patient to discharge at [facility] Assist level, sponge-bathing (no tub/shower bathing) and utilizing Corene Cornea imitation for all functional transfers and LB dressing for increased safety and decrease of caregiver burnout.    Patient's care partner is independent to provide the necessary physical and cognitive assistance at discharge, and was equipped with ample opportunities for education with therapy services to increase safety upon discharge due to the nature of patient's complex care. Unfortunately, spouse/care partner opted to attend the bare minimum amount of time, ~60 mins to review transfers and ADL functioning, see OT note from 3/11 to access education reviewed.  Reasons goals not met: NA  Recommendation:  Patient will benefit from ongoing skilled OT services in home health setting to continue to advance functional skills in the area of BADL and Reduce care partner burden.  Equipment: Custom WC, BSC, and Corene Cornea imitation (purchased out of pocket)  Reasons for discharge: treatment goals met and discharge from hospital  Patient/family agrees with progress made and goals achieved: Yes  OT Discharge Precautions/Restrictions  Precautions Precautions: Fall Precaution/Restrictions Comments: L-hemi, L-inattention, decreased midline orientation Restrictions Weight Bearing Restrictions Per Provider Order: No ADL ADL Equipment Provided: Long-handled sponge Eating: Set up Where Assessed-Eating: Wheelchair Grooming: Modified independent Where Assessed-Grooming:  Sitting at sink Upper Body Bathing: Minimal assistance Where Assessed-Upper Body Bathing: Shower, Edge of bed Lower Body Bathing: Minimal assistance Where Assessed-Lower Body Bathing: Edge of bed, Shower Upper Body Dressing: Minimal assistance Where Assessed-Upper Body Dressing: Edge of bed, Wheelchair Lower Body Dressing: Minimal assistance (For threading of garments) Where Assessed-Lower Body Dressing: Other (Comment), Edge of bed, Wheelchair (Stedy) Toileting: Minimal assistance Where Assessed-Toileting: Toilet, Bedside Commode Toilet Transfer: Contact guard, Close supervision Toilet Transfer Method: Other (comment) Toilet Transfer Equipment: Bedside commode, Grab bars, Other (comment) [name]) Tub/Shower Transfer: Other (comment) (Not appropriate at this time.) Film/video editor: Close supervision, Administrator, arts Method: Other (comment) [name]) Walk-In Scientist, research (physical sciences): Other (comment) (Padded 3-in-1 with cutout) ADL Comments: Pt to discharge home with use of stedy for functional transfers and LB dressing to assist with optimal patient independence and decrease caregiver burden. Vision Baseline Vision/History: 1 Wears glasses Patient Visual Report: Diplopia;Other (comment) (Intermediate reports of hemianopsia, needs further assessment.) Vision Assessment?: Yes Eye Alignment: Within Functional Limits Ocular Range of Motion: Within Functional Limits Alignment/Gaze Preference: Head tilt (Towards Left) Tracking/Visual Pursuits: Able to track stimulus in all quads without difficulty Saccades: Additional head turns occurred during testing;Decreased speed of saccadic movement Convergence: Impaired (comment) Diplopia Assessment: Present in far gaze Perception  Perception: Impaired Praxis Praxis: Impaired Praxis Impairment Details: Motor planning Cognition Cognition Overall Cognitive Status: Impaired/Different from baseline Arousal/Alertness:  Awake/alert Orientation Level: Person;Place;Situation Memory: Impaired Memory Impairment: Decreased short term memory;Retrieval deficit;Decreased recall of new information;Storage deficit Awareness: Impaired Awareness Impairment: Intellectual impairment;Emergent impairment;Anticipatory impairment Problem Solving: Impaired Problem Solving Impairment: Functional basic;Functional complex Executive Function: Organizing;Self Monitoring;Self Correcting Organizing: Impaired Self Monitoring: Impaired Self Correcting: Impaired Safety/Judgment: Impaired Comments: Pt has had multiple falls out of WC despite extensive education on safety precautions and alternative ways to reach items on the floor. Sensation Sensation Light  Touch: Impaired Detail Peripheral sensation comments: LUE/LLE numbness, no reports of neuropathic pain. Light Touch Impaired Details: Impaired LUE;Impaired LLE Hot/Cold: Appears Intact Proprioception: Impaired by gross assessment Stereognosis: Impaired by gross assessment Coordination Gross Motor Movements are Fluid and Coordinated: No Fine Motor Movements are Fluid and Coordinated: No Coordination and Movement Description: Deficits due to L-sided hemiplegia/hemiparesis. 9 Hole Peg Test: Unable to complete Motor  Motor Motor: Hemiplegia;Abnormal postural alignment and control Motor - Skilled Clinical Observations: L sided hemiplegia; decreased midline orientation; decreased attention to L-side of body Mobility  Transfers Sit to Stand: Supervision/Verbal cueing;Contact Guard/Touching assist Stand to Sit: Supervision/Verbal cueing;Contact Guard/Touching assist  Trunk/Postural Assessment  Cervical Assessment Cervical Assessment: Exceptions to [facility] (L lateral flexion) Thoracic Assessment Thoracic Assessment: Exceptions to [facility] (Rounded shoulders) Lumbar Assessment Lumbar Assessment: Exceptions to [facility] (Posterior tilt and L lateral lean) Postural Control Postural Control:  Deficits on evaluation Head Control: Rests in L lateral flexion Trunk Control: Decreased with fatigue Righting Reactions: Delayed Protective Responses: Delayed  Balance Balance Balance Assessed: Yes Static Sitting Balance Static Sitting - Balance Support: Feet supported Static Sitting - Level of Assistance: 5: Stand by assistance (SUP) Dynamic Sitting Balance Dynamic Sitting - Balance Support: During functional activity Dynamic Sitting - Level of Assistance: 5: Stand by assistance;4: Min assist (CGA-Min A (fatigue)) Dynamic Sitting - Balance Activities: Lateral lean/weight shifting;Forward lean/weight shifting Static Standing Balance Static Standing - Balance Support: Bilateral upper extremity supported Static Standing - Level of Assistance: 5: Stand by assistance;Other (comment) (SUP-CGA utilizing stedy) Dynamic Standing Balance Dynamic Standing - Balance Support: During functional activity;Left upper extremity supported Dynamic Standing - Level of Assistance: 5: Stand by assistance;4: Min assist (CGA-Min A utilizing stedy) Dynamic Standing - Balance Activities: Lateral lean/weight shifting;Reaching for objects Extremity/Trunk Assessment RUE Assessment RUE Assessment: Within Functional Limits LUE Assessment LUE Assessment: Exceptions to [facility] Passive Range of Motion (PROM) Comments: WFL Active Range of Motion (AROM) Comments: ~10-20 degrees of shoulder flexion/abduction, ~120 degrees of elbow flexion, WFL for digital flexion/extension but weak grasp, all LUE functioning impacted by L-inattention General Strength Comments: Generalized 2-3/5 MMT LUE Body System: Neuro Brunstrum levels for arm and hand: Arm;Hand Brunstrum level for arm: Stage IV Movement is deviating from synergy Brunstrum level for hand: Stage VI Isolated joint movements   Lou Cal, OTR/L, MSOT  03/26/2023, 6:55 AM  Limmie Patricia, OTR/L,CBIS  Supplemental OT - [facility] Secure Chat Preferred

## 2023-03-26 NOTE — Patient Care Conference (Signed)
 Inpatient RehabilitationTeam Conference and Plan of Care Update Date: 03/25/2023   Time: 12:02 PM    Patient Name: Edward Rodgers      Medical Record Number: [id number]  Date of Birth: 1950/08/19 Sex: Male         Room/Bed: 4M04C/4M04C-01 Payor Info: Payor: Multimedia programmer / Plan: [employer] MEDICARE / Product Type: *No Product type* /    Admit Date/Time:  02/21/2023  2:28 PM  Primary Diagnosis:  Acute right PCA stroke [facility])  Hospital Problems: Principal Problem:   Acute right PCA stroke (HCC) Active Problems:   Coping style affecting medical condition   Precordial pain   Shortness of breath   Benign hypertension   Heart failure with improved ejection fraction (HFimpEF) (HCC)   Pulmonary embolus (HCC)   Acute deep vein thrombosis (DVT) of popliteal vein of left lower extremity (HCC)   Chronic heart failure with mildly reduced ejection fraction (HFmrEF, 41-49%) (HCC)   AKI (acute kidney injury) (HCC)   Hyponatremia   Cognitive change    Expected Discharge Date: Expected Discharge Date: 03/28/23  Team Members Present: Physician leading conference: Dr. Sula Soda Social Worker Present: Dossie Der, LCSW Nurse Present: Chana Bode, RN PT Present: Ambrose Finland, PT OT Present: Lou Cal, OT SLP Present: [name], SLP PPS Coordinator present : Fae Pippin, SLP     Current Status/Progress Goal Weekly Team Focus  Bowel/Bladder   Patient s continent and incontinent of bladder, and continent of bowel, LBM 03/24/2023   Restore bladder continence   Assist withtime toileting patients per standard protocol and educate families    Swallow/Nutrition/ Hydration               ADL's   Min A for UB ADLs, Min-light Mod A for LB ADLs & toileting (using stedy for balance deficits). Caregiver education completed, highly encourage more time due to complexity of care and safety risk for both patient and spouse.   Min A overall; disharge his tub/shower goal; BSC    LUE NMR, caregiver education, LB AE as appropriate    Mobility   pt at goal level---however needs further caregiver education for pt/caregiver safety   sup/min A  D/C 3/15 follow up HHPT, DME: pt has loaner WC, needs hospital bed, BSC, recommending pt purhcase RW OOP for continued HHPT. focus caregiver education    Communication                Safety/Cognition/ Behavioral Observations  supervision to min assist for attention to task, min to mod for recall of daily events, awareness improving   min assist   basic problem solving, sustained attention, awareness, memory strategies and consistency to utilize external memory aids    Pain   Left shoulder pain; lidocaine patch    2   Assess pain QS/PRN ,with f/u    Skin      N/a   Assess skin q shift No addition skin breakdown      Discharge Planning:  Wife and brother were here yesterday for fmaily training and decided in the middle to go eat in the cafeteria, do not seem to understand the amount of care pt will require at home. Wil come back Friday only for PT all worker could get to agree too. wants to wait on hospital bed and tub bench. Will let worker know if changes mind once home. Pt will be extremely high risk to be re-admitted to a hospital due to fall risk   Team Discussion: Patient  post right PCA CVA with visual deficits.   Patient on target to meet rehab goals: yes, currently needs min assist for upper body care and mi - mod assist for lower body care; will sponge bathe at home.    *See Care Plan and progress notes for long and short-term goals.   Revisions to Treatment Plan:  Car transfers using a Stedy   Teaching Needs: Safety, medication management, transfers, toileting, etc.   Current Barriers to Discharge: Decreased caregiver support, Home enviroment access/layout, and Behavior Wife is resistant to recommendations and limits family education  Possible Resolutions to Barriers: Family education HH  follow up services DME: hospital bed, [name], RW and [facility]     Medical Summary Current Status: still working on bp. still some safety awareness issues. leukocytosis improved. sleep-wake cycle better. weight trending up  Barriers to Discharge: Medical stability   Possible Resolutions to Barriers/Weekly Focus: adjusting bp meds to desired range. added spironolactone to manage volume, daily assessment of labs/data   Continued Need for Acute Rehabilitation Level of Care: The patient requires daily medical management by a physician with specialized training in physical medicine and rehabilitation for the following reasons: Direction of a multidisciplinary physical rehabilitation program to maximize functional independence : Yes Medical management of patient stability for increased activity during participation in an intensive rehabilitation regime.: Yes Analysis of laboratory values and/or radiology reports with any subsequent need for medication adjustment and/or medical intervention. : Yes   I attest that I was present, lead the team conference, and concur with the assessment and plan of the team.   Chana Bode B 03/26/2023, 10:47 AM

## 2023-03-26 NOTE — Progress Notes (Signed)
 Nurse walked into room, bed alarm going off. Pt sitting on side of bed asking to use the urinal. Nurse gave patient urinal, pt stated, " I can't use it with ya'll in here." Nurse asked if he would like assistance with pants, he said , "no." Nurse watched patient look around and state, "well I'm going to stand." Nurse re orientated patent to safety precautions. Nurse and tech assisted patient with taking off pants and setting up urinal while in bed. Patient refused stedy. Call light within reach. Bed alarm on.

## 2023-03-26 NOTE — Plan of Care (Signed)
 Goals adjusted 3/13 to accurately reflect impacts for LUE inattention.  Problem: RH Balance Goal: LTG: Patient will maintain dynamic sitting balance (OT) Description: LTG:  Patient will maintain dynamic sitting balance with assistance during activities of daily living (OT) Flowsheets (Taken 03/26/2023 0700) LTG: Pt will maintain dynamic sitting balance during ADLs with: Contact Guard/Touching assist   Problem: RH Eating Goal: LTG Patient will perform eating w/assist, cues/equip (OT) Description: LTG: Patient will perform eating with assist, with/without cues using equipment (OT) Flowsheets (Taken 03/26/2023 0700) LTG: Pt will perform eating with assistance level of: Set up assist    Problem: RH Dressing Goal: LTG Patient will perform upper body dressing (OT) Description: LTG Patient will perform upper body dressing with assist, with/without cues (OT). Flowsheets (Taken 03/26/2023 0700) LTG: Pt will perform upper body dressing with assistance level of: Minimal Assistance - Patient > 75%   Problem: RH Functional Use of Upper Extremity Goal: LTG Patient will use RT/LT upper extremity as a (OT) Description: LTG: Patient will use right/left upper extremity as a stabilizer/gross assist/diminished/nondominant/dominant level with assist, with/without cues during functional activity (OT) Flowsheets (Taken 03/26/2023 0700) LTG: Pt will use upper extremity in functional activity with assistance level of: Supervision/Verbal cueing

## 2023-03-26 NOTE — Progress Notes (Signed)
 PROGRESS NOTE   Subjective/Complaints:  Pt didn't sleep the greatest last night. Has been a little slow this morning. Denies an problems over night  ROS: Patient denies fever, rash, sore throat, blurred vision, dizziness, nausea, vomiting, diarrhea, cough, shortness of breath or chest pain, joint or back/neck pain, headache, or mood change. .    Objective:   No results found.    No results for input(s): "WBC", "HGB", "HCT", "PLT" in the last 72 hours.     Recent Labs    03/26/23 0548  NA 136  K 4.0  CL 101  CO2 28  GLUCOSE 93  BUN 18  CREATININE 1.00  CALCIUM 9.3        Intake/Output Summary (Last 24 hours) at 03/26/2023 0949 Last data filed at 03/26/2023 0400 Gross per 24 hour  Intake 840 ml  Output 425 ml  Net 415 ml         Physical Exam: Vital Signs Blood pressure (!) 154/92, pulse 68, temperature 97.6 F (36.4 C), resp. rate 19, height 5\' 9"  (1.753 m), weight 86.9 kg, SpO2 98%.  Constitutional: No distress . Vital signs reviewed. HEENT: NCAT, EOMI, oral membranes moist Neck: supple Cardiovascular: RRR without murmur. No JVD    Respiratory/Chest: CTA Bilaterally without wheezes or rales. Normal effort    GI/Abdomen: BS +, non-tender, non-distended Ext: no clubbing, cyanosis, tr edema LUE Psych: pleasant and cooperative  Skin: No breakdown noted on visible portion    Neuro:     He is awake and alert.   Oriented to month, day, hospital, self. Sl slow processing.   Language overall appears to be intact.    RUE and RLE 4+/5 LLE 4/5 LUE 3 to 3+/4 prox to distall. Minimal tone L hand Mild sensory deficits left arm and leg--persistent.    Assessment/Plan: 1. Functional deficits which require 3+ hours per day of interdisciplinary therapy in a comprehensive inpatient rehab setting. Physiatrist is providing close team supervision and 24 hour management of active medical problems listed  below. Physiatrist and rehab team continue to assess barriers to discharge/monitor patient progress toward functional and medical goals  Care Tool:  Bathing    Body parts bathed by patient: Left arm, Chest, Abdomen, Front perineal area, Right upper leg, Left upper leg, Right lower leg, Face, Buttocks   Body parts bathed by helper: Right arm, Left lower leg     Bathing assist Assist Level: Minimal Assistance - Patient > 75%     Upper Body Dressing/Undressing Upper body dressing        Upper body assist Assist Level: Minimal Assistance - Patient > 75%    Lower Body Dressing/Undressing Lower body dressing      What is the patient wearing?: Underwear/pull up, Pants     Lower body assist Assist for lower body dressing: Minimal Assistance - Patient > 75% (Using stedy to stand)     Toileting Toileting    Toileting assist Assist for toileting: Minimal Assistance - Patient > 75% (Using stedy for standing balance)     Transfers Chair/bed transfer  Transfers assist     Chair/bed transfer assist level: Minimal Assistance - Patient > 75%  Locomotion Ambulation   Ambulation assist   Ambulation activity did not occur: Safety/medical concerns  Assist level: 2 helpers (+2 for WC follow) Assistive device: Walker-rolling Max distance: 50 feet   Walk 10 feet activity   Assist  Walk 10 feet activity did not occur: Safety/medical concerns  Assist level: 2 helpers Assistive device: Walker-rolling   Walk 50 feet activity   Assist Walk 50 feet with 2 turns activity did not occur: Safety/medical concerns  Assist level: 2 helpers Assistive device: Walker-rolling    Walk 150 feet activity   Assist Walk 150 feet activity did not occur: Safety/medical concerns         Walk 10 feet on uneven surface  activity   Assist Walk 10 feet on uneven surfaces activity did not occur: Safety/medical concerns         Wheelchair     Assist Is the patient  using a wheelchair?: Yes Type of Wheelchair: Manual    Wheelchair assist level: Minimal Assistance - Patient > 75% Max wheelchair distance: 50    Wheelchair 50 feet with 2 turns activity    Assist        Assist Level: Minimal Assistance - Patient > 75%   Wheelchair 150 feet activity     Assist      Assist Level: Maximal Assistance - Patient 25 - 49%   Blood pressure (!) 154/92, pulse 68, temperature 97.6 F (36.4 C), resp. rate 19, height 5\' 9"  (1.753 m), weight 86.9 kg, SpO2 98%.  Medical Problem List and Plan: 1. 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  Await plan for loop recorder             -patient may shower -ELOS/Goals: 3/15   min assist with PT, OT and supervision with SLP   -grounds pass -Left WHO ordered -Continue CIR therapies including PT, OT     2.  Antithrombotics: -DVT/anticoagulation:   -03/07/23 now on eliquis per pharmacy -antiplatelet therapy:   now only on eliquis  3. Pain Management: Lidoderm patch as directed, Tylenol as needed 4. Mood/Sleep/sundowning: Effexor 150 mg every evening, provide emotional support  -continue melatonin for sleep             -antipsychotic agents: Seroquel 50 mg nighty at 2030, have back up dose             -dc sleep chart  -observe tonight. No changes for now 5. Neuropsych/cognition: This patient is capable of making decisions on his own behalf. 6. Skin/Wound Care: Routine skin checks 7. Fluids/Electrolytes/Nutrition: Routine in and outs with follow-up chemistries, continue vitamins/supplements 8.  Hypothyroidism.  Synthroid 43mcg/day 9.  Hypertension.   -currently on toprol xl 100mg , losartan 25mg  in addition to norvasc below  -3/12 bp's a little better with norvasc 10mg . Will hold on losartan increase for now  3/13 increase losartan to 50mg  daily as DBP remains elevated Vitals:   03/23/23 2349 03/24/23 0100 03/24/23 0120  03/24/23 0322  BP: 128/87 (!) 144/87 (!) 144/91 (!) 134/102   03/24/23 0432 03/24/23 0600 03/24/23 1235 03/24/23 2037  BP: (!) 142/89 137/87 (!) 138/91 (!) 147/89   03/25/23 0507 03/25/23 1455 03/25/23 2001 03/26/23 0427  BP: (!) 148/91 137/81 (!) 149/95 (!) 154/92    10.H/O prostate cancer. Myrbetriq 50 mg daily.  -3/5 occasional incontinence but improving 11.Hyperlipidemia. Lipitor 80mg  daily 12.Constipation.             -miralax  daily, SenokotS 1 tab daily -02/22/23 LBM 2days ago, monitor today, if no BM tomorrow then may want to increase regimen -2/25 LBM 2/23, consider addtional medication if no bowel movement today -  LBM 3/13 13.H/O gout. Zyloprim 300mg  daily 14.  AKI versus CKD  -Encourage oral fluid intake, decrease spironolactone to 12.5 mg   -3/10- Bn/Cr 16/1---continue to encourage fluids15.  History of CHF.  Last EF 55%, mild diastolic dysfunction  -Continue metoprolol, decrease spironolactone to 12.5 mg as above -2/14 Wts stable, has not appear to show signs of fluid overload, continue to monitor -02/28/23 wt up but doubt accuracy, monitor, no overload on exam -2/19-20 weight stable, does not appear to show signs of fluid overload, EF 40 to 45% on echo. -Cardiology has been following, they discontinued amlodipine and started losartan 25 mg daily-beginning tomorrow-- signing off 2/22 (see recs below)  -Follow[facility]up [facility] postdischarge  -2/27 weight is overall stable, continue to monitor  -2/28 restart losartan  -3/4-5 weight trending a little up, no signs of fluid overload at this time  -3/10--weight trending up, 7L up for admit--may need to resume aldactone if weight goes up any further  3/13 weight down further to 86.9 -continue spironolactone 12.5mg  every day.   Filed Weights   03/24/23 0500 03/25/23 0500 03/26/23 0427  Weight: 91.5 kg 88.9 kg 86.9 kg    16.  Severe cervical stenosis: Consider neurosurgery outpatient follow-up    18.  Bleeding from  his penis  -UA neg,HGB was stable on 2/11 -Denies additional occurrences.  During team meeting was noted this happened intermittently during acute hospital stay  19. Nasal congestion: Flonase 1 spray daily  20. Staring episodes/vision changes 03/01/23 -03/01/23 having staring episodes and ?chronic vision changes/hemianopsia? Spoke with Dr. Derry Lory of neuro, will get 24hr EEG and head CT, no code stroke at this point; they will see pt, appreciate their assistance.  -2/17 overnight EEG neg, CT head without acute changes  21. Chest pain  -EKG, Trop, CXR ordered -Cardiology following appreciate assistance.  D-dimer positive, getting CT PE study to rule out PE.  If positive will need to discuss with neurology regarding anticoagulation.  He is now on 2 L nasal cannula oxygen  -2/20 patient reports his chest pain is resolved  22. Pulmonary embolism / DVT left popliteal vein -2/19 discussed with neurology and cardiology. Heparin grip started, wife updated by phone. Critical care medicine consulted 2/20 seen by critical care, continue heparin eventual transition to DOAC, if hemodynamic changes contact PCCM again. Advised 24h bedrest with [facility] 2/21 plan to transition to DOAC later today, discussed with pharmacy by phone  -03/07/23 cardiology saw pt, recommend eliquis per protolocol, continue toprol/low dose losartan, and they signed off-- f/up in 3-4wks with Dr. Odis Hollingshead  23. Leukocytosis: resolved 24. Mild hyponatremia, mild continue to monitor   -3/6 stable at 136  -03/23/23 stable at 139  LOS: 33 days A FACE TO FACE EVALUATION WAS PERFORMED  Ranelle Oyster 03/26/2023, 9:49 AM

## 2023-03-26 NOTE — Progress Notes (Signed)
 Physical Therapy Discharge Summary  Patient Details  Name: Edward Rodgers MRN: [id number] Date of Birth: February 20, 1950  Date of Discharge from PT service:March 28, 2023  Patient has met {NUMBERS 0-12:18577} of {NUMBERS 0-12:18577} long term goals due to improved activity tolerance, improved balance, improved postural control, increased strength, increased range of motion, ability to compensate for deficits, improved attention, improved awareness, and improved coordination.  Patient to discharge at a wheelchair level Supervision with use of stedy for transfers.  Patient's care partner is independent to provide the necessary physical assistance at discharge.  Reasons goals not met: ***  Recommendation:  Patient will benefit from ongoing skilled PT services in home health setting to continue to advance safe functional mobility, address ongoing impairments in ***, and minimize fall risk.  Equipment: Ramp, custom manual WC, BSC, recommend pt purchase RW for pt to progress gait with follow up PT, provided pt with L UE splint on RW.   Reasons for discharge: treatment goals met and discharge from hospital  Patient/family agrees with progress made and goals achieved: Yes  PT Discharge Precautions/Restrictions Precautions Precautions: Fall Precaution/Restrictions Comments: L-hemi, L-inattention, decreased midline orientation Restrictions Weight Bearing Restrictions Per Provider Order: No  Pain Interference   Vision/Perception  Vision - History Ability to See in Adequate Light: 1 Impaired Vision - Assessment Eye Alignment: Within Functional Limits Ocular Range of Motion: Within Functional Limits Alignment/Gaze Preference: Head tilt;Head turned (R gaze preference) Tracking/Visual Pursuits: Able to track stimulus in all quads without difficulty Saccades: Additional head turns occurred during testing;Decreased speed of saccadic movement Convergence: Impaired (comment) Diplopia Assessment:  Present in far gaze Perception Perception: Impaired Preception Impairment Details: Inattention/Neglect Praxis Praxis: Impaired Praxis Impairment Details: Motor planning  Cognition Overall Cognitive Status: Impaired/Different from baseline Arousal/Alertness: Awake/alert Orientation Level: Oriented X4 Memory: Impaired Memory Impairment: Decreased short term memory;Retrieval deficit;Decreased recall of new information;Storage deficit Awareness: Impaired Awareness Impairment: Intellectual impairment;Emergent impairment;Anticipatory impairment Problem Solving: Impaired Problem Solving Impairment: Functional basic;Functional complex Executive Function: Organizing;Self Monitoring;Self Correcting Organizing: Impaired Self Monitoring: Impaired Self Correcting: Impaired Safety/Judgment: Impaired Comments: Pt has had multiple falls out of WC despite extensive education on safety precautions and alternative ways to reach items on the floor. Sensation Sensation Light Touch: Impaired Detail Peripheral sensation comments: LUE/LLE numbness, no reports of neuropathic pain. Light Touch Impaired Details: Impaired LUE;Impaired LLE Hot/Cold: Appears Intact Proprioception: Impaired by gross assessment Stereognosis: Impaired by gross assessment Coordination Gross Motor Movements are Fluid and Coordinated: No Fine Motor Movements are Fluid and Coordinated: No Coordination and Movement Description: Deficits due to L-sided hemiplegia/hemiparesis. 9 Hole Peg Test: Unable to complete Motor  Motor Motor: Hemiplegia;Abnormal postural alignment and control Motor - Skilled Clinical Observations: L sided hemiplegia; decreased midline orientation; decreased attention to L-side of body  Mobility Bed Mobility Bed Mobility: Rolling Right;Rolling Left;Supine to Sit;Sit to Supine Rolling Right: Supervision/verbal cueing Rolling Left: Supervision/Verbal cueing Supine to Sit: Supervision/Verbal cueing Sit to  Supine: Supervision/Verbal cueing Transfers Transfers: Sit to Stand;Stand to Sit;Stand Pivot Transfers Sit to Stand: Supervision/Verbal cueing;Minimal Assistance - Patient > 75%;Contact Guard/Touching assist (min A with no AD, supervision/CGA with use of steady) Stand to Sit: Supervision/Verbal cueing;Contact Guard/Touching assist;Minimal Assistance - Patient > 75% (sup/CGA with use of steady, min A with no AD) Stand Pivot Transfers: Minimal Assistance - Patient > 75% (with no AD) Stand Pivot Transfer Details: Verbal cues for sequencing;Verbal cues for technique;Verbal cues for precautions/safety Transfer (Assistive device): None Transfer via Lift Equipment: Youth worker Ambulation: Yes Gait Assistance: Moderate  Assistance - Patient 50-74% Gait Distance (Feet): 81 Feet Assistive device: Rollator (with LUE splint on RW) Gait Assistance Details: Verbal cues for precautions/safety;Verbal cues for technique;Verbal cues for sequencing;Verbal cues for gait pattern;Manual facilitation for weight shifting;Verbal cues for safe use of DME/AE Gait Gait: Yes Gait Pattern: Impaired Gait Pattern: Step-through pattern;Decreased stance time - left;Decreased step length - left;Decreased stride length;Decreased hip/knee flexion - left;Decreased dorsiflexion - left;Decreased weight shift to left;Lateral trunk lean to left;Narrow base of support;Poor foot clearance - left;Poor foot clearance - right Stairs / Additional Locomotion Stairs: No Corporate treasurer: Yes Wheelchair Assistance: Doctor, general practice: Right upper extremity;Right lower extremity Wheelchair Parts Management: Needs assistance Distance: 150  Trunk/Postural Assessment  Cervical Assessment Cervical Assessment: Exceptions to [facility] (L lateral flexion, R cervical rotation) Thoracic Assessment Thoracic Assessment: Exceptions to [facility] (rounded shoulders) Lumbar Assessment Lumbar  Assessment: Exceptions to [facility] (posterior pelvic tilt and L lateral trunk lean) Postural Control Postural Control: Deficits on evaluation Head Control: Rests in L lateral flexion, and R cervical rotation Trunk Control: Decreased with fatigue Righting Reactions: Delayed Protective Responses: Delayed  Balance Balance Balance Assessed: Yes Static Sitting Balance Static Sitting - Balance Support: Feet supported Static Sitting - Level of Assistance: 5: Stand by assistance (supervision) Dynamic Sitting Balance Dynamic Sitting - Balance Support: During functional activity Dynamic Sitting - Level of Assistance: 5: Stand by assistance;4: Min assist (CGA/min A) Dynamic Sitting - Balance Activities: Lateral lean/weight shifting;Forward lean/weight shifting Static Standing Balance Static Standing - Balance Support: Bilateral upper extremity supported Static Standing - Level of Assistance: 5: Stand by assistance;Other (comment) (CGA/sup using steady, CGA/min A using RW) Dynamic Standing Balance Dynamic Standing - Balance Support: During functional activity;Left upper extremity supported Dynamic Standing - Level of Assistance: 5: Stand by assistance;4: Min assist;3: Mod assist (min-mod A with no AD or use of RW, CGA/min A with use of steady) Dynamic Standing - Balance Activities: Lateral lean/weight shifting;Reaching for objects Extremity Assessment  RUE Assessment RUE Assessment: Within Functional Limits LUE Assessment LUE Assessment: Exceptions to [facility] Passive Range of Motion (PROM) Comments: WFL Active Range of Motion (AROM) Comments: ~10-20 degrees of shoulder flexion/abduction, ~120 degrees of elbow flexion, WFL for digital flexion/extension but weak grasp, all LUE functioning impacted by L-inattention General Strength Comments: Generalized 2-3/5 MMT LUE Body System: Neuro Brunstrum levels for arm and hand: Arm;Hand Brunstrum level for arm: Stage IV Movement is deviating from  synergy Brunstrum level for hand: Stage VI Isolated joint movements RLE Assessment RLE Assessment: Within Functional Limits General Strength Comments: assessed in sitting LLE Assessment LLE Assessment: Exceptions to [facility] General Strength Comments: assessed in sitting, grossly 3/5 with exception of DF 2+/5   Ambrose Finland 03/26/2023, 7:59 AM

## 2023-03-26 NOTE — Patient Instructions (Addendum)
 Perform each exercise ____10____ reps. 1-2x a day.   1) Protraction   Start by holding a wand or cane at chest height.  Next, slowly push the wand outwards in front of your body so that your elbows become fully straightened. Then, return to the original position.     2) Shoulder FLEXION   In the standing position, hold a wand/cane with both arms, palms down on both sides. Raise up the wand/cane allowing your unaffected arm to perform most of the effort. Your affected arm should be partially relaxed.  (GOAL: shoulder level only)    3) Internal/External ROTATION   In the standing position, hold a wand/cane with both hands keeping your elbows bent. Move your arms and wand/cane to one side.  Your affected arm should be partially relaxed while your unaffected arm performs most of the effort.      Theraputty Home Exercise Program  Complete 1-2 times a day.  putty squeeze  Pt. should squeeze putty in hand trying to keep it round by rotating putty after each squeeze. push fingers through putty to palm each time. Complete 10 times.   PUTTY KEY GRIP  Hold the putty at the top of your hand. Squeeze the putty between your thumb and the side of your 2nd finger as shown. Complete 10  times.    PUTTY 3 JAW CHUCK  Roll up some putty into a ball then flatten it. Then, firmly squeeze it with your first 3 fingers as shown. Complete 10 times.

## 2023-03-26 NOTE — Progress Notes (Signed)
 Speech Language Pathology Daily Session Note  Patient Details  Name: Edward Rodgers MRN: [id number] Date of Birth: 16-May-1950  Today's Date: 03/26/2023 SLP Individual Time: 0907-1001 SLP Individual Time Calculation (min): 54 min  Short Term Goals: Week 5: SLP Short Term Goal 1 (Week 5): STGs=LTGs d/t ELOS  Skilled Therapeutic Interventions: SLP conducted skilled therapy session targeting cognitive retraining goals. Patient upright in wheelchair upon SLP arrival and attempting to connect to hospital WiFi. SLP provided assistance with WiFi connection and configured settings to allow patient to automatically connect to promote improved internet access and independence. 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 Patient continues to demonstrate impaired awareness of impairments, thus provided cues and prompts throughout to gently highlight challenges during this higher level cognitive task. Patient discusses long term memories of process to secure his position at the church with supervision, but requires up to min assist to sequence and organize plan to create sermon. Given mod assist for execution of sequencing and organization, patient selected topic and located scriptures appropriate for provided topic.  Patient was left in chair with call bell in reach and chair alarm set. SLP will continue to target goals per plan of care.       Pain Pain Assessment Pain Scale: 0-10 Pain Score: 0-No pain  Therapy/Group: Individual Therapy  [name], M.A., CCC-SLP  [name] 03/26/2023, 12:15 PM

## 2023-03-26 NOTE — Progress Notes (Signed)
 Physical Therapy Session Note  Patient Details  Name: Edward Rodgers MRN: [id number] Date of Birth: [birth year]  Today's Date: 03/26/2023 PT Individual Time: 0802-0830, 2130-8657 PT Individual Time Calculation (min): 28 min, 56 min   Short Term Goals: Week 5:  PT Short Term Goal 1 (Week 5): STG=LTG 2/2 ELOS  Skilled Therapeutic Interventions/Progress Updates:     Pt supine asleep in bed upon arrival. Pt awoken and agreeable to therapy. Pt denies any pain. Pt overall demos  increased sleepyiness/grogginess, pt reports he stayed up watching a priest last night. Sleep log also endorses inconsistent sleep last night.   Supine to sit with min A 2/2 pt still waking up. Pt incontinent of urine and requesting to use bathroom.   Pt performed steady transfer bed to [facility] (over toilet) with supervision, pt requiring increased verbal cues for attention to L UE/L LE positioning this morning likely 2/2 to fatiuge/sleepiness.   Pt continent of urine. Pt donned briefs and pants with min A on L side.   Pt seated in [facility] with all needs within reach and seatbelt and seatbelt alarm on.   Treatment Session 2   Pt seated in recliner upon arrival. Pt agreeable to therapy. Pt denies any pain. Pt wife and friend present for family education.   Discussed DME: pt wife reports ramp will be installed Saturday morning, stedy and bed rails for pt queen size bed will be delivered tomorrow. Pt wife reports she has a Sports administrator at home. Pt BSC is in the room. Therapist provided pt wife with L UE splint for RW, and recommended pt purchase RW (resource provided) to continue gait training with follow up therapy.   Discussed family training for tomorrow: Planning to practice car transfer to pt car at 1:00 at [facility] with use of steady. Will provide education on how to fold WC for transport. Recommended pt brother also attend family training tomorrow, as therapist is recommending he be the only person to perform stand pivot  transfer without the steady at this time for pt and caregiver safety.   Pt wife utilized steady to transfer pt WC to rehab apartment queen size bed with supervision form therapist, verbal cues provided for safety with emphasis on locking WC brakes and steady brakes. Pt demos improved carryover with transfer bed to WC.   Education provided anytime pt is sitting in [facility] he should be wearing his seatbelt. Education provided to pt not to unbuckle seatbelt when resting as it is meant to serve as a reminder to scoot back. Pt and wife verbalized understnading and agreeable.   Pt performed bed mobility in rehab aparmtment with supervision, verbal cues provided for inattention to L UE/LE. Pt wife returned demonstration.   Education provided for how to install the bed rails. Pt wife verbalized understanding.   Pt wife returned demonstration of locking/unlocking brakes, donning/doffing leg rests, donning/doffing arm rests.   Pt wife provides teach back of recommendation for 24/7 supervision for safety 2/2 cognitive deficits, and poor safety awareness.   Pt seated in WC with all needs within reach, seatbelt and seatbelt alarm on.     Therapy Documentation Precautions:  Precautions Precautions: Fall Precaution/Restrictions Comments: L-hemi, L-inattention, decreased midline orientation Restrictions Weight Bearing Restrictions Per Provider Order: No   Therapy/Group: Individual Therapy  [facility] Ambrose Finland, [place], DPT  03/26/2023, 7:44 AM

## 2023-03-26 NOTE — Plan of Care (Signed)
  Problem: Consults Goal: RH STROKE PATIENT EDUCATION Description: See Patient Education module for education specifics  Outcome: Progressing   Problem: RH BOWEL ELIMINATION Goal: RH STG MANAGE BOWEL W/MEDICATION W/ASSISTANCE Description: STG Manage Bowel with Medication with mod I  Assistance. Outcome: Progressing   Problem: RH BLADDER ELIMINATION Goal: RH STG MANAGE BLADDER WITH ASSISTANCE Description: STG Manage Bladder With min Assistance Outcome: Progressing   Problem: RH BLADDER ELIMINATION Goal: RH STG MANAGE BLADDER WITH MEDICATION WITH ASSISTANCE Description: STG Manage Bladder With Medication With mod I Assistance. Outcome: Progressing   Problem: RH SKIN INTEGRITY Goal: RH STG SKIN FREE OF INFECTION/BREAKDOWN Description: Manage skin w min assist Outcome: Progressing   Problem: RH KNOWLEDGE DEFICIT Goal: RH STG INCREASE KNOWLEDGE OF HYPERTENSION Description: Patient and wife will be able to manage HTN using educational resources for medications and dietary modification independently Outcome: Progressing

## 2023-03-27 ENCOUNTER — Telehealth (HOSPITAL_COMMUNITY): Payer: Self-pay | Admitting: Pharmacy Technician

## 2023-03-27 ENCOUNTER — Other Ambulatory Visit (HOSPITAL_COMMUNITY): Payer: Self-pay

## 2023-03-27 MED ORDER — OXYBUTYNIN CHLORIDE ER 10 MG PO TB24: 10.0000 mg | ORAL_TABLET | Freq: Every day | ORAL | Status: DC

## 2023-03-27 MED ORDER — OXYBUTYNIN CHLORIDE ER 10 MG PO TB24
10.0000 mg | ORAL_TABLET | Freq: Every day | ORAL | 0 refills | Status: DC
  Filled 2023-03-27: qty 30, 30d supply, fill #0

## 2023-03-27 NOTE — Plan of Care (Signed)
  Problem: RH Problem Solving Goal: LTG Patient will demonstrate problem solving for (SLP) Description: LTG:  Patient will demonstrate problem solving for basic/complex daily situations with cues  (SLP) Outcome: Completed/Met   Problem: RH Memory Goal: LTG Patient will demonstrate ability for day to day (SLP) Description: LTG:   Patient will demonstrate ability for day to day recall/carryover during cognitive/linguistic activities with assist  (SLP) Outcome: Completed/Met   Problem: RH Awareness Goal: LTG: Patient will demonstrate awareness during functional activites type of (SLP) Description: LTG: Patient will demonstrate awareness during functional activites type of (SLP) Outcome: Completed/Met   

## 2023-03-27 NOTE — Progress Notes (Signed)
 PROGRESS NOTE   Subjective/Complaints:  Had a better night. No issues this morning. Excited about getting home tomorrow!  ROS: Patient denies fever, rash, sore throat, blurred vision, dizziness, nausea, vomiting, diarrhea, cough, shortness of breath or chest pain, joint or back/neck pain, headache, or mood change.    Objective:   No results found.    No results for input(s): "WBC", "HGB", "HCT", "PLT" in the last 72 hours.     Recent Labs    03/26/23 0548  NA 136  K 4.0  CL 101  CO2 28  GLUCOSE 93  BUN 18  CREATININE 1.00  CALCIUM 9.3        Intake/Output Summary (Last 24 hours) at 03/27/2023 1023 Last data filed at 03/27/2023 1610 Gross per 24 hour  Intake 716 ml  Output 460 ml  Net 256 ml         Physical Exam: Vital Signs Blood pressure (!) 146/84, pulse 70, temperature 98.3 F (36.8 C), temperature source Oral, resp. rate 18, height 5\' 9"  (1.753 m), weight 89.3 kg, SpO2 96%.  Constitutional: No distress . Vital signs reviewed. HEENT: NCAT, EOMI, oral membranes moist Neck: supple Cardiovascular: RRR without murmur. No JVD    Respiratory/Chest: CTA Bilaterally without wheezes or rales. Normal effort    GI/Abdomen: BS +, non-tender, non-distended Ext: no clubbing, cyanosis, or edema Psych: pleasant and cooperative  Skin: No breakdown noted on visible portion    Neuro:     He is awake and alert.   Oriented to month, day, hospital, self. Sl slow processing.   Language overall appears to be intact.    RUE and RLE 4+/5 LLE 4/5 LUE 2 to 2+ left deltoid, 3+ biceps, triceps, 3 to 3+ left hand and wrist Mild sensory deficits left arm and leg--persistent    Assessment/Plan: 1. Functional deficits which require 3+ hours per day of interdisciplinary therapy in a comprehensive inpatient rehab setting. Physiatrist is providing close team supervision and 24 hour management of active medical problems  listed below. Physiatrist and rehab team continue to assess barriers to discharge/monitor patient progress toward functional and medical goals  Care Tool:  Bathing    Body parts bathed by patient: Left arm, Chest, Abdomen, Front perineal area, Right upper leg, Left upper leg, Right lower leg, Face, Buttocks   Body parts bathed by helper: Right arm, Left lower leg     Bathing assist Assist Level: Minimal Assistance - Patient > 75%     Upper Body Dressing/Undressing Upper body dressing   What is the patient wearing?: Pull over shirt    Upper body assist Assist Level: Minimal Assistance - Patient > 75%    Lower Body Dressing/Undressing Lower body dressing      What is the patient wearing?: Underwear/pull up, Pants     Lower body assist Assist for lower body dressing: Minimal Assistance - Patient > 75% (using stedy to stand)     Toileting Toileting    Toileting assist Assist for toileting: Minimal Assistance - Patient > 75%     Transfers Chair/bed transfer  Transfers assist     Chair/bed transfer assist level: Minimal Assistance - Patient > 75% (using stedy  to stand)     Locomotion Ambulation   Ambulation assist   Ambulation activity did not occur: Safety/medical concerns  Assist level: Moderate Assistance - Patient 50 - 74% Assistive device: Walker-rolling (with L UE splint on RW) Max distance: 81 feet   Walk 10 feet activity   Assist  Walk 10 feet activity did not occur: Safety/medical concerns  Assist level: Moderate Assistance - Patient - 50 - 74% Assistive device: Walker-rolling   Walk 50 feet activity   Assist Walk 50 feet with 2 turns activity did not occur: Safety/medical concerns  Assist level: Moderate Assistance - Patient - 50 - 74% Assistive device: Walker-rolling    Walk 150 feet activity   Assist Walk 150 feet activity did not occur: Safety/medical concerns         Walk 10 feet on uneven surface  activity   Assist Walk  10 feet on uneven surfaces activity did not occur: Safety/medical concerns         Wheelchair     Assist Is the patient using a wheelchair?: Yes Type of Wheelchair: Manual    Wheelchair assist level: Supervision/Verbal cueing (hemi technique) Max wheelchair distance: 150    Wheelchair 50 feet with 2 turns activity    Assist        Assist Level: Supervision/Verbal cueing   Wheelchair 150 feet activity     Assist      Assist Level: Supervision/Verbal cueing   Blood pressure (!) 146/84, pulse 70, temperature 98.3 F (36.8 C), temperature source Oral, resp. rate 18, height 5\' 9"  (1.753 m), weight 89.3 kg, SpO2 96%.  Medical Problem List and Plan: 1. 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  Await plan for loop recorder             -patient may shower -ELOS/Goals: 3/15   min assist with PT, OT and supervision with SLP   -grounds pass -Left WHO ordered -Continue CIR therapies including PT, OT, and SLP     2.  Antithrombotics: -DVT/anticoagulation:   -03/07/23 now on eliquis per pharmacy -antiplatelet therapy:   now only on eliquis  3. Pain Management: Lidoderm patch as directed, Tylenol as needed 4. Mood/Sleep/sundowning: Effexor 150 mg every evening, provide emotional support  -continue melatonin for sleep             -antipsychotic agents: Seroquel 50 mg nighty at 2030, have back up dose             -dc sleep chart  -observe tonight. No changes for now 5. Neuropsych/cognition: This patient is capable of making decisions on his own behalf. 6. Skin/Wound Care: Routine skin checks 7. Fluids/Electrolytes/Nutrition: Routine in and outs with follow-up chemistries, continue vitamins/supplements 8.  Hypothyroidism.  Synthroid 54mcg/day 9.  Hypertension.   -currently on toprol xl 100mg , losartan 25mg  in addition to norvasc below  -3/12 bp's a little better with norvasc 10mg . Will  hold on losartan increase for now  3/14-improved bp's after increase of losartan to 50mg  daily    -will need follow up as outpt Vitals:   03/24/23 0432 03/24/23 0600 03/24/23 1235 03/24/23 2037  BP: (!) 142/89 137/87 (!) 138/91 (!) 147/89   03/25/23 0507 03/25/23 1455 03/25/23 2001 03/26/23 0427  BP: (!) 148/91 137/81 (!) 149/95 (!) 154/92   03/26/23 1427 03/26/23 2014 03/26/23 2107 03/27/23 0400  BP: 115/84 (!) 151/102 130/89 (!) 146/84    10.H/O prostate cancer. Myrbetriq 50  mg daily.  -3/5 occasional incontinence but improving 11.Hyperlipidemia. Lipitor 80mg  daily 12.Constipation.             -miralax daily, SenokotS 1 tab daily -02/22/23 LBM 2days ago, monitor today, if no BM tomorrow then may want to increase regimen -2/25 LBM 2/23, consider addtional medication if no bowel movement today -  LBM 3/13 13.H/O gout. Zyloprim 300mg  daily 14.  AKI versus CKD  -Encourage oral fluid intake, decrease spironolactone to 12.5 mg   -3/10- Bn/Cr 16/1---continue to encourage fluids15.  History of CHF.  Last EF 55%, mild diastolic dysfunction  -Continue metoprolol, decrease spironolactone to 12.5 mg as above -2/14 Wts stable, has not appear to show signs of fluid overload, continue to monitor -02/28/23 wt up but doubt accuracy, monitor, no overload on exam -2/19-20 weight stable, does not appear to show signs of fluid overload, EF 40 to 45% on echo. -Cardiology has been following, they discontinued amlodipine and started losartan 25 mg daily-beginning tomorrow-- signing off 2/22 (see recs below)  -Follow-up with cardiology at the [facility] postdischarge  -2/27 weight is overall stable, continue to monitor  -2/28 restart losartan  -3/4-5 weight trending a little up, no signs of fluid overload at this time  -3/10--weight trending up, 7L up for admit--may need to resume aldactone if weight goes up any further  3/14 weight down/stable with spironolactone 12.5mg  every day.     -f/u as outpt Filed Weights    03/25/23 0500 03/26/23 0427 03/27/23 0500  Weight: 88.9 kg 86.9 kg 89.3 kg    16.  Severe cervical stenosis: Consider neurosurgery outpatient follow-up    18.  Bleeding from his penis  -UA neg,HGB was stable on 2/11 -Denies additional occurrences.  During team meeting was noted this happened intermittently during acute hospital stay  19. Nasal congestion: Flonase 1 spray daily  20. Staring episodes/vision changes 03/01/23 -03/01/23 having staring episodes and ?chronic vision changes/hemianopsia? Spoke with Dr. Derry Lory of neuro, will get 24hr EEG and head CT, no code stroke at this point; they will see pt, appreciate their assistance.  -2/17 overnight EEG neg, CT head without acute changes  21. Chest pain  -EKG, Trop, CXR ordered -Cardiology following appreciate assistance.  D-dimer positive, getting CT PE study to rule out PE.  If positive will need to discuss with neurology regarding anticoagulation.  He is now on 2 L nasal cannula oxygen  -2/20 patient reports his chest pain is resolved  22. Pulmonary embolism / DVT left popliteal vein -2/19 discussed with neurology and cardiology. Heparin grip started, wife updated by phone. Critical care medicine consulted 2/20 seen by critical care, continue heparin eventual transition to DOAC, if hemodynamic changes contact PCCM again. Advised 24h bedrest with [facility] 2/21 plan to transition to DOAC later today, discussed with pharmacy by phone  -03/07/23 cardiology saw pt, recommend eliquis per protolocol, continue toprol/low dose losartan, and they signed off-- f/up in 3-4wks with Dr. Odis Hollingshead  23. Leukocytosis: resolved 24. Mild hyponatremia, mild continue to monitor   -3/6 stable at 136  -03/23/23 stable at 136  LOS: 34 days A FACE TO FACE EVALUATION WAS PERFORMED  Ranelle Oyster 03/27/2023, 10:23 AM

## 2023-03-27 NOTE — Progress Notes (Signed)
 Inpatient Rehabilitation Care Coordinator Discharge Note DC 3/15  Patient Details  Name: Edward Rodgers MRN: [id number] Date of Birth: December 18, 1950   Discharge location: HOME WITH WIFE AND BROTHER TO PROVIDE 24/7 PHYISCAL CARE  Length of Stay: 35 DAYS  Discharge activity level: MIN LEVEL OF ASSIST  Home/community participation: ACTIVE  Patient response FA:OZHYQM Literacy - How often do you need to have someone help you when you read instructions, pamphlets, or other written material from your doctor or pharmacy?: Never  Patient response VH:QIONGE Isolation - How often do you feel lonely or isolated from those around you?: Never  Services provided included: MD, RD, PT, OT, SLP, RN, CM, TR, Pharmacy, Neuropsych, SW  Financial Services:  Field seismologist Utilized: Private Insurance [employer]  Choices offered to/list presented to: PT AND WIFE  Follow-up services arranged:  Home Health, DME, Patient/Family has no preference for HH/DME agencies Home Health Agency: [facility]  PT  OT  RN COULD NOT FIND ANY HOME HEALTH IN [place] TO PROVIDE SPEECH-OPTION WAS OP AND WIFE WANTED HOME HEALTH    DME : [facility]WHEELCHA[facility] AND ADAPT 3 IN 1 WANTED TO WAIT ON HOSPITAL BED UNTIL GOT HOME  BROTHER PURCHASED A STEDY FOR TRANSFERS    Patient response to transportation need: Is the patient able to respond to transportation needs?: Yes In the past 12 months, has lack of transportation kept you from medical appointments or from getting medications?: No In the past 12 months, has lack of transportation kept you from meetings, work, or from getting things needed for daily living?: No   Patient/Family verbalized understanding of follow-up arrangements:  Yes  Individual responsible for coordination of the follow-up plan: CHRISTINE WIFE  [phone]  Confirmed correct DME delivered: [name] 03/27/2023    Comments (or additional information):SEVERAL DAYS OF EDUCATION  EVEN SO NOT SURE IF WIFE AND BROTHER TRULY REALIZE THE AMOUNT OF CARE PT REQUIRES. AT ONE SESSION THEY MISSED TO GO TO THE [facility] TO EAT AND MISSED SP AND OT. FEEL PT IS HIGH RISK FOR RE-ADMISSION DUE TO THE LACK OF UNDERSTANDING OF CARE AND WAS HERE DAILY AND DID MULTIPLE SESSIONS WITH HANDS ON OR TRIED TO AND WIFE WATCHED. BROTHER DID PURCHASE A STEDY FOR TRANSFERS. IT SEEMS HE WILL BE THE CAREGIVER BUT WILL NOT BE HOME WITH THEM 24/7. IF RETURNS HE WILL NEED TO BE PLACED IN A SNF  Summary of Stay    Date/Time Discharge Planning CSW  03/25/23 0935 Wife and brother were here yesterday for fmaily training and decided in the middle to go eat in the cafeteria, do not seem to understand the amount of care pt will require at home. Wil come back Friday only for PT all worker could get to agree too. wants to wait on hospital bed and tub bench. Will let worker know if changes mind once home. Pt will be extremely high risk to be re-admitted to a hospital due to fall risk RGD  03/18/23 0940 Wife and brother will need to begin hands on education in prepartion for DC. Pt tends to wax and wane hopeuflly can be more consistent in his levels. Family aware will need a ramp for home. Have reached out to the [place] it will take time to get an aide and any equipment needed RGD  03/11/23 0928 Wife and brother are here daily. Pt has declined in funciton due to medical issues and will need extension in his stay. Getting ramp for stairs into home.  Wants to take home and not to a SNF RGD  03/03/23 1348 Wife and pt's brother to assist with his care. His brother has been here during therapies unsure if has participated will need too. Wife informed of need for ramp for steps at home RGD  02/25/23 0841 Plan to go home with wife who is a CNA and still works but she will take FMLA to provide assist RGD       Nikkia Devoss, Lemar Livings

## 2023-03-27 NOTE — Progress Notes (Signed)
 Speech Language Pathology Discharge Summary  Patient Details  Name: Edward Rodgers MRN: [id number] Date of Birth: 15-Oct-1950  Date of Discharge from SLP service:March 27, 2023  Today's Date: 03/27/2023 SLP Individual Time: 1005-1059 SLP Individual Time Calculation (min): 54 min  Skilled Therapeutic Interventions:  SLP conducted skilled therapy session targeting cognitive retraining goals. SLP facilitated various basic to mildly complex time-based problem solving questions. Originally, presented all information verbally, however with more complex questions, patient benefited from written presentation of key elements. Patient benefited from extra time to solve and overall min assist. Then moved to math-based questions. Patient required min assist to solve addition, multiplication, division, and subtraction questions of basic to mild complexity. Patient was left in chair with call bell in reach and chair alarm set. Patient is at goal level and is appropriate for discharge. See full summary below.   Patient has met 3 of 3 long term goals.  Patient to discharge at [facility] level.  Reasons goals not met: n/a   Clinical Impression/Discharge Summary: Patient has made steady progress throughout CIR admission, meeting 3/3 long term goals set for duration. Patient currently requires min assist across basic cognitive tasks including memory of daily events, basic problem solving, safety awareness, awareness of deficits, and sustained attention. Patient would benefit from continued ST services at next venue of care to target lingering cognitive deficits, improve independence and safety, and return patient to preferred activities. Patient requires 24 hour supervision for safety. Patient and family education complete. SLP will sign off.    Care Partner:  Caregiver Able to Provide Assistance: Yes  Type of Caregiver Assistance: Cognitive  Recommendation:  Home Health SLP;24 hour supervision/assistance   Rationale for SLP Follow Up: Maximize cognitive function and independence   Equipment: n/a   Reasons for discharge: Discharged from hospital   Patient/Family Agrees with Progress Made and Goals Achieved: Yes   [name], M.A., CCC-SLP  [name] 03/27/2023, 11:01 AM

## 2023-03-27 NOTE — Telephone Encounter (Signed)
 Pharmacy Patient Advocate Encounter  Received notification from [facility] that Prior Authorization for Lidocaine 5% patches  has been APPROVED from 03/27/2023 to 01/13/2024   PA #/Case ID/Reference #: [id number]

## 2023-03-27 NOTE — Progress Notes (Signed)
 Physical Therapy Session Note  Patient Details  Name: Edward Rodgers MRN: [id number] Date of Birth: 13-Sep-1950  Today's Date: 03/27/2023 PT Individual Time: 4098-1191 PT Individual Time Calculation (min): 56 min   Short Term Goals: Week 5:  PT Short Term Goal 1 (Week 5): STG=LTG 2/2 ELOS  Skilled Therapeutic Interventions/Progress Updates:    Pt presents in room in [facility] with pt wife present for family education. Pt denies pain. Majority of session focused on transfer training for car transfer via Stedy with pt wife and brother to pt personal vehicle, as well as WC mobility training for navigating obstacles and door ways as well as positioning self for transfer. Pt wife states that ramp has been installed as of yesterday at home for safe entry into house at DC. Pt wife propels pt dependently to first floor entrance to practice transfer training with therapist bringing [name] to practice. Therapist educates pt wife on positioning of pt, WC safety positioning and maneuvering Stedy, and positioning Stedy into front passenger car door. Pt completes stand to stedy with supervision, pt wife able to manage Stedy breaks, widening Stedy legs, and managing posterior supports once pt in standing with cues. Therapist and pt wife position stedy in car door with increased time for managing posterior supports according to pt person vehicle car door width. Pt completes sit to car seat with cues for positioning, min assist. Pt brings BLEs into car with min assist for LLE into/out of car. Pt able to position in car seat with cues. Once pt seated in car, therapist educates pt wife and brother on managing WC for transport including leg rests, folding up seat, and LUE support. Pt brother able to demonstrating managing WC into/out of trunk. Pt then completes transfer out of car with min assist for LLE however good positioning on edge of car seat, pt wife able to manage positioning stedy in car door. Pt stands to stedy with CGA  and therapist able to manage posterior supports. Pt transferred back to [facility] with stedy CGA with pt brother able to manage stedy parts. Pt transported dependently back to room via WC. Pt then completes WC mobility on unit with R hemi technique, supervision for negotiating doorways with L inattention, pt able to manage WC brakes with BUEs with cues and increased time with LUE. Pt returns to room with supervision and positions self next to bed with all needs within reach, call light in place, chair alarm donned and activated at end of session.  Therapy Documentation Precautions:  Precautions Precautions: Fall Precaution/Restrictions Comments: L-hemi, L-inattention, decreased midline orientation Restrictions Weight Bearing Restrictions Per Provider Order: No   Therapy/Group: Individual Therapy  [name] 03/27/2023, 4:18 PM

## 2023-03-27 NOTE — Plan of Care (Signed)
  Problem: RH Wheelchair Mobility Goal: LTG Patient will propel w/c in controlled environment (PT) Description: LTG: Patient will propel wheelchair in controlled environment, # of feet with assist (PT) Outcome: Not Met (add Reason) Note: Pt continues to require supervision for navigating obstacles and attention to L side during WC mobility   Problem: RH Balance Goal: LTG Patient will maintain dynamic sitting balance (PT) Description: LTG:  Patient will maintain dynamic sitting balance with assistance during mobility activities (PT) Outcome: Completed/Met Goal: LTG Patient will maintain dynamic standing balance (PT) Description: LTG:  Patient will maintain dynamic standing balance with assistance during mobility activities (PT) Outcome: Completed/Met   Problem: Sit to Stand Goal: LTG:  Patient will perform sit to stand with assistance level (PT) Description: LTG:  Patient will perform sit to stand with assistance level (PT) Outcome: Completed/Met   Problem: RH Bed Mobility Goal: LTG Patient will perform bed mobility with assist (PT) Description: LTG: Patient will perform bed mobility with assistance, with/without cues (PT). Outcome: Completed/Met   Problem: RH Bed to Chair Transfers Goal: LTG Patient will perform bed/chair transfers w/assist (PT) Description: LTG: Patient will perform bed to chair transfers with assistance (PT). Outcome: Completed/Met   Problem: RH Car Transfers Goal: LTG Patient will perform car transfers with assist (PT) Description: LTG: Patient will perform car transfers with assistance (PT). Outcome: Completed/Met   Problem: RH Ambulation Goal: LTG Patient will ambulate in controlled environment (PT) Description: LTG: Patient will ambulate in a controlled environment, # of feet with assistance (PT). Outcome: Completed/Met   Problem: RH Wheelchair Mobility Goal: LTG Patient will propel w/c in home environment (PT) Description: LTG: Patient will propel  wheelchair in home environment, # of feet with assistance (PT). Outcome: Completed/Met

## 2023-03-27 NOTE — Telephone Encounter (Signed)
 Pharmacy Patient Advocate Encounter   Received notification that prior authorization for Lidocaine 5% patchesis required/requested.   Insurance verification completed.   The patient is insured through [employer] .   Per test claim: PA required; PA submitted to above mentioned insurance via CoverMyMeds Key/confirmation #/EOC [id number] Status is pending

## 2023-03-28 NOTE — Progress Notes (Signed)
 PROGRESS NOTE   Subjective/Complaints:  Pt doing great, ready to go home. Slept well, denies pain, LBM yesterday, urinating fine, denies any other complaints or concerns. Meds reviewed with him and his wife.   ROS: as per HPI. Denies CP, SOB, abd pain, N/V/D/C, or any other complaints at this time.     Objective:   No results found.    No results for input(s): "WBC", "HGB", "HCT", "PLT" in the last 72 hours.     Recent Labs    03/26/23 0548  NA 136  K 4.0  CL 101  CO2 28  GLUCOSE 93  BUN 18  CREATININE 1.00  CALCIUM 9.3        Intake/Output Summary (Last 24 hours) at 03/28/2023 1039 Last data filed at 03/27/2023 2029 Gross per 24 hour  Intake 360 ml  Output 200 ml  Net 160 ml         Physical Exam: Vital Signs Blood pressure (!) 166/103, pulse 75, temperature 98.4 F (36.9 C), temperature source Oral, resp. rate 18, height 5\' 9"  (1.753 m), weight 88.7 kg, SpO2 94%.  Constitutional: No distress . Vital signs reviewed. Resting comfortably in bed.  HEENT: NCAT, EOMI, oral membranes moist Neck: supple Cardiovascular: RRR without murmur appreciated. No JVD    Respiratory/Chest: CTA Bilaterally without wheezes or rales. Normal effort    GI/Abdomen: BS +, non-tender, non-distended Ext: no clubbing, cyanosis, or edema Psych: pleasant and cooperative  Skin: No breakdown noted on visible portion   PRIOR EXAMS: Neuro:     He is awake and alert.   Oriented to month, day, hospital, self. Sl slow processing.   Language overall appears to be intact.    RUE and RLE 4+/5 LLE 4/5 LUE 2 to 2+ left deltoid, 3+ biceps, triceps, 3 to 3+ left hand and wrist Mild sensory deficits left arm and leg--persistent    Assessment/Plan: 1. Functional deficits which require 3+ hours per day of interdisciplinary therapy in a comprehensive inpatient rehab setting. Physiatrist is providing close team supervision and 24 hour  management of active medical problems listed below. Physiatrist and rehab team continue to assess barriers to discharge/monitor patient progress toward functional and medical goals  Care Tool:  Bathing    Body parts bathed by patient: Left arm, Chest, Abdomen, Front perineal area, Right upper leg, Left upper leg, Right lower leg, Face, Buttocks   Body parts bathed by helper: Right arm, Left lower leg     Bathing assist Assist Level: Minimal Assistance - Patient > 75%     Upper Body Dressing/Undressing Upper body dressing   What is the patient wearing?: Pull over shirt    Upper body assist Assist Level: Minimal Assistance - Patient > 75%    Lower Body Dressing/Undressing Lower body dressing      What is the patient wearing?: Underwear/pull up, Pants     Lower body assist Assist for lower body dressing: Minimal Assistance - Patient > 75% (using stedy to stand)     Toileting Toileting    Toileting assist Assist for toileting: Minimal Assistance - Patient > 75%     Transfers Chair/bed transfer  Transfers assist  Chair/bed transfer assist level: Minimal Assistance - Patient > 75% (using stedy to stand)     Locomotion Ambulation   Ambulation assist   Ambulation activity did not occur: Safety/medical concerns  Assist level: Moderate Assistance - Patient 50 - 74% Assistive device: Walker-rolling (with L UE splint on RW) Max distance: 81 feet   Walk 10 feet activity   Assist  Walk 10 feet activity did not occur: Safety/medical concerns  Assist level: Moderate Assistance - Patient - 50 - 74% Assistive device: Walker-rolling   Walk 50 feet activity   Assist Walk 50 feet with 2 turns activity did not occur: Safety/medical concerns  Assist level: Moderate Assistance - Patient - 50 - 74% Assistive device: Walker-rolling    Walk 150 feet activity   Assist Walk 150 feet activity did not occur: Safety/medical concerns         Walk 10 feet on  uneven surface  activity   Assist Walk 10 feet on uneven surfaces activity did not occur: Safety/medical concerns         Wheelchair     Assist Is the patient using a wheelchair?: Yes Type of Wheelchair: Manual    Wheelchair assist level: Supervision/Verbal cueing (hemi technique) Max wheelchair distance: 150    Wheelchair 50 feet with 2 turns activity    Assist        Assist Level: Supervision/Verbal cueing   Wheelchair 150 feet activity     Assist      Assist Level: Supervision/Verbal cueing   Blood pressure (!) 166/103, pulse 75, temperature 98.4 F (36.9 C), temperature source Oral, resp. rate 18, height 5\' 9"  (1.753 m), weight 88.7 kg, SpO2 94%.  Medical Problem List and Plan: 1. 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  Await plan for loop recorder             -patient may shower -ELOS/Goals: 3/15   min assist with PT, OT and supervision with SLP   -grounds pass -Left WHO ordered -Continue CIR therapies including PT, OT, and SLP   -03/28/23 d/c today, meds reviewed, questions answered  2.  Antithrombotics: -DVT/anticoagulation:   -03/07/23 now on eliquis per pharmacy -antiplatelet therapy:   now only on eliquis  3. Pain Management: Lidoderm patch as directed, Tylenol as needed 4. Mood/Sleep/sundowning: Effexor 150 mg every evening, provide emotional support  -continue melatonin for sleep             -antipsychotic agents: Seroquel 50 mg nighty at 2030, have back up dose             -dc sleep chart  -observe tonight. No changes for now 5. Neuropsych/cognition: This patient is capable of making decisions on his own behalf. 6. Skin/Wound Care: Routine skin checks 7. Fluids/Electrolytes/Nutrition: Routine in and outs with follow-up chemistries, continue vitamins/supplements 8.  Hypothyroidism.  Synthroid 22mcg/day 9.  Hypertension.   -currently on toprol xl 100mg ,  losartan 25mg  in addition to norvasc below  -3/12 bp's a little better with norvasc 10mg . Will hold on losartan increase for now  3/14-improved bp's after increase of losartan to 50mg  daily    -will need follow up as outpt  -03/28/23 BPs variable but just had meds changed, f/up outpatient Vitals:   03/25/23 0507 03/25/23 1455 03/25/23 2001 03/26/23 0427  BP: (!) 148/91 137/81 (!) 149/95 (!) 154/92   03/26/23 1427 03/26/23 2014 03/26/23 2107 03/27/23 0400  BP: 115/84 (!) 151/102 130/89 Marland Kitchen)  146/84   03/27/23 1446 03/27/23 1959 03/28/23 0500 03/28/23 0629  BP: 127/80 (!) 140/89 (!) 166/103 (!) 166/103    10.H/O prostate cancer. Myrbetriq 50 mg daily.  -3/5 occasional incontinence but improving 11.Hyperlipidemia. Lipitor 80mg  daily 12.Constipation.             -miralax daily, SenokotS 1 tab daily -02/22/23 LBM 2days ago, monitor today, if no BM tomorrow then may want to increase regimen -2/25 LBM 2/23, consider addtional medication if no bowel movement today -  LBM 03/27/23 13.H/O gout. Zyloprim 300mg  daily 14.  AKI versus CKD  -Encourage oral fluid intake, decrease spironolactone to 12.5 mg   -3/10- Bn/Cr 16/1---continue to encourage fluids15.  History of CHF.  Last EF 55%, mild diastolic dysfunction  -Continue metoprolol, decrease spironolactone to 12.5 mg as above -2/14 Wts stable, has not appear to show signs of fluid overload, continue to monitor -02/28/23 wt up but doubt accuracy, monitor, no overload on exam -2/19-20 weight stable, does not appear to show signs of fluid overload, EF 40 to 45% on echo. -Cardiology has been following, they discontinued amlodipine and started losartan 25 mg daily-beginning tomorrow-- signing off 2/22 (see recs below)  -[facility] post[facility]charge  -2/27 weight is overall stable, continue to monitor  -2/28 restart losartan  -3/4-5 weight trending a little up, no signs of fluid overload at this time  -3/10--weight trending up, 7L up  for admit--may need to resume aldactone if weight goes up any further  3/14 weight down/stable with spironolactone 12.5mg  every day.     -f/u as outpt Filed Weights   03/26/23 0427 03/27/23 0500 03/28/23 0500  Weight: 86.9 kg 89.3 kg 88.7 kg    16.  Severe cervical stenosis: Consider neurosurgery outpatient follow-up    18.  Bleeding from his penis  -UA neg,HGB was stable on 2/11 -Denies additional occurrences.  During team meeting was noted this happened intermittently during acute hospital stay  19. Nasal congestion: Flonase 1 spray daily  20. Staring episodes/vision changes 03/01/23 -03/01/23 having staring episodes and ?chronic vision changes/hemianopsia? Spoke with Dr. Derry Lory of neuro, will get 24hr EEG and head CT, no code stroke at this point; they will see pt, appreciate their assistance.  -2/17 overnight EEG neg, CT head without acute changes  21. Chest pain  -EKG, Trop, CXR ordered -Cardiology following appreciate assistance.  D-dimer positive, getting CT PE study to rule out PE.  If positive will need to discuss with neurology regarding anticoagulation.  He is now on 2 L nasal cannula oxygen  -2/20 patient reports his chest pain is resolved  22. Pulmonary embolism / DVT left popliteal vein -2/19 discussed with neurology and cardiology. Heparin grip started, wife updated by phone. Critical care medicine consulted 2/20 seen by critical care, continue heparin eventual transition to DOAC, if hemodynamic changes contact PCCM again. Advised 24h bedrest with [facility] 2/21 plan to transition to DOAC later today, discussed with pharmacy by phone  -03/07/23 cardiology saw pt, recommend eliquis per protolocol, continue toprol/low dose losartan, and they signed off-- f/up in 3-4wks with Dr. Odis Hollingshead  23. Leukocytosis: resolved 24. Mild hyponatremia, mild continue to monitor   -3/6 stable at 136  -03/28/23 stable at 136  LOS: 35 days A FACE TO [facility] EVALUATION WAS PERFORMED  [address] 03/28/2023, 10:39 AM

## 2023-03-28 NOTE — Progress Notes (Signed)
 Patient ID: Edward Rodgers, male   DOB: May 21, 1950, 73 y.o.   MRN: [id number]  INPATIENT REHABILITATION DISCHARGE NOTE   Discharge instructions by: Rhona Raider, PA-C  Verbalized understanding: yes  Skin care/Wound care healing: CDI  Pain: none reported  IV's: discontinued  Tubes/Drains: n/a  O2: n/a  Safety instructions: given   Patient belongings: returned  Discharged to: home  Discharged via: family  Notes:

## 2023-03-28 NOTE — Progress Notes (Signed)
 Inpatient Rehabilitation Discharge Medication Review by a Pharmacist  A complete drug regimen review was completed for this patient to identify any potential clinically significant medication issues.  High Risk Drug Classes Is patient taking? Indication by Medication  Antipsychotic Yes Quetiapine - agitation, mood  Anticoagulant Yes Apixaban - PE, CVA  Antibiotic No   Opioid No   Antiplatelet No   Hypoglycemics/insulin No   Vasoactive Medication Yes Losartan, amlodipine, Metoprolol XL- HTN Spironolactone - fluid  Chemotherapy No   Other Yes Lidocaine - pain Mirabegron - incontinence Alendronate - bones Allopurinol - gout Atorvastatin - HLD Latanoprost - glaucoma Levothyroxine - low thyroid Venlafaxine - mood     Type of Medication Issue Identified Description of Issue Recommendation(s)  Drug Interaction(s) (clinically significant)     Duplicate Therapy     Allergy     No Medication Administration End Date     Incorrect Dose     Additional Drug Therapy Needed     Significant med changes from prior encounter (inform family/care partners about these prior to discharge).    Other       Clinically significant medication issues were identified that warrant physician communication and completion of prescribed/recommended actions by midnight of the next day:  No  Name of provider notified for urgent issues identified:   Provider Method of Notification:     Pharmacist comments: None  Time spent performing this drug regimen review (minutes):  20 minutes  Thank you Okey Regal, PharmD

## 2023-03-31 ENCOUNTER — Ambulatory Visit (INDEPENDENT_AMBULATORY_CARE_PROVIDER_SITE_OTHER): Payer: Medicare Other

## 2023-03-31 DIAGNOSIS — I639 Cerebral infarction, unspecified: Secondary | ICD-10-CM | POA: Diagnosis not present

## 2023-04-01 LAB — CUP PACEART REMOTE DEVICE CHECK
Date Time Interrogation Session: 20250318154514
Implantable Pulse Generator Implant Date: 20250207

## 2023-04-25 ENCOUNTER — Encounter: Payer: Self-pay | Admitting: Internal Medicine

## 2023-04-27 ENCOUNTER — Encounter: Attending: Physical Medicine & Rehabilitation | Admitting: Physical Medicine & Rehabilitation

## 2023-04-27 ENCOUNTER — Encounter: Payer: Self-pay | Admitting: Physical Medicine & Rehabilitation

## 2023-04-27 VITALS — BP 128/79 | HR 68 | Ht 69.0 in | Wt 195.0 lb

## 2023-04-27 DIAGNOSIS — G4700 Insomnia, unspecified: Secondary | ICD-10-CM | POA: Diagnosis not present

## 2023-04-27 DIAGNOSIS — N3281 Overactive bladder: Secondary | ICD-10-CM | POA: Insufficient documentation

## 2023-04-27 DIAGNOSIS — I2699 Other pulmonary embolism without acute cor pulmonale: Secondary | ICD-10-CM | POA: Insufficient documentation

## 2023-04-27 DIAGNOSIS — I5032 Chronic diastolic (congestive) heart failure: Secondary | ICD-10-CM | POA: Diagnosis present

## 2023-04-27 DIAGNOSIS — I63531 Cerebral infarction due to unspecified occlusion or stenosis of right posterior cerebral artery: Secondary | ICD-10-CM | POA: Diagnosis present

## 2023-04-27 MED ORDER — APIXABAN 5 MG PO TABS: 5.0000 mg | ORAL_TABLET | Freq: Two times a day (BID) | ORAL | 0 refills | Status: DC

## 2023-04-27 MED ORDER — LIDOCAINE 5 % EX PTCH: 1.0000 | MEDICATED_PATCH | CUTANEOUS | 1 refills | Status: DC

## 2023-04-27 MED ORDER — LOSARTAN POTASSIUM 50 MG PO TABS: 50.0000 mg | ORAL_TABLET | Freq: Every day | ORAL | 0 refills | Status: AC

## 2023-04-27 MED ORDER — OXYBUTYNIN CHLORIDE ER 10 MG PO TB24: 10.0000 mg | ORAL_TABLET | Freq: Every day | ORAL | 0 refills | Status: DC

## 2023-04-27 MED ORDER — MELATONIN 5 MG PO TABS: 5.0000 mg | ORAL_TABLET | Freq: Every day | ORAL | 0 refills | Status: DC

## 2023-04-27 NOTE — Progress Notes (Signed)
 Subjective:    Patient ID: Edward Rodgers, male    DOB: 05-11-1950, 73 y.o.   MRN: [id number]  HPI  Edward Rodgers is here with his partner here for follow-up after completing treatment at [facility].  Edward Rodgers reports he has been doing overall well.  He still working with physical and Occupational Therapy, continues to require some help from his wife for ADLs.  Pain is overall well-controlled.  He is out of several of his medications and would like a refill.  He has a follow-up scheduled with his PCP next month but will run out of medications before then.  His blood pressure has been well-controlled.  Continues to use oxybutynin to help reduce incontinence.  She has not followed up yet with cardiology or neurology.  Pain Inventory Average Pain 5 Pain Right Now 5 My pain is aching  LOCATION OF PAIN  neck, back  BOWEL Number of stools per week: 10-14 Oral laxative use No  Type of laxative . Enema or suppository use No  History of colostomy No  Incontinent No   BLADDER Pads In and out cath, frequency . Able to self cath  . Bladder incontinence No  Frequent urination Yes  Leakage with coughing No  Difficulty starting stream No  Incomplete bladder emptying No    Mobility walk with assistance use a walker how many minutes can you walk? 2-3 ability to climb steps?  no do you drive?  no  Function retired  Neuro/Psych trouble walking  Prior Studies Hospital f/u  Physicians involved in your care Hospital f/u   No family history on file. Social History   Socioeconomic History   Marital status: Married    Spouse name: Not on file   Number of children: Not on file   Years of education: Not on file   Highest education level: Not on file  Occupational History   Not on file  Tobacco Use   Smoking status: Former   Smokeless tobacco: Never  Vaping Use   Vaping status: Never Used  Substance and Sexual Activity   Alcohol use: No   Drug use: No   Sexual activity: Not on  file  Other Topics Concern   Not on file  Social History Narrative   Not on file   Social Drivers of Health   Financial Resource Strain: Not on file  Food Insecurity: Patient Declined (02/15/2023)   Hunger Vital Sign    Worried About Running Out of Food in the Last Year: Patient declined    Ran Out of Food in the Last Year: Patient declined  Transportation Needs: Patient Unable To Answer (02/15/2023)   PRAPARE - Administrator, Civil Service (Medical): Patient unable to answer    Lack of Transportation (Non-Medical): Patient unable to answer  Physical Activity: Not on file  Stress: Not on file  Social Connections: Patient Unable To Answer (02/15/2023)   Social Connection and Isolation Panel [NHANES]    Frequency of Communication with Friends and Family: Patient unable to answer    Frequency of Social Gatherings with Friends and Family: Patient unable to answer    Attends Religious Services: Patient unable to answer    Active Member of Clubs or Organizations: Patient unable to answer    Attends Banker Meetings: Patient unable to answer    Marital Status: Patient unable to answer   Past Surgical History:  Procedure Laterality Date   COLONOSCOPY N/A 07/15/2017   Procedure: COLONOSCOPY;  Surgeon:  Ruby Corporal, MD;  Location: [facility];  Service: Endoscopy;  Laterality: N/A;  830   IR CT HEAD LTD  02/14/2023   IR PERCUTANEOUS ART THROMBECTOMY/INFUSION INTRACRANIAL INC DIAG ANGIO  02/14/2023   LOOP RECORDER INSERTION N/A 02/20/2023   Procedure: LOOP RECORDER INSERTION;  Surgeon: Verona Goodwill, MD;  Location[facility] INVASIVE CV LAB;  Service: Cardiovascular;  Laterality: N/A;   POLYPECTOMY  07/15/2017   Procedure: POLYPECTOMY;  Surgeon: Ruby Corporal, MD;  Location: [facility];  Service: Endoscopy;;  colon   PROSTATE SURGERY     RADIOLOGY WITH ANESTHESIA Left 02/14/2023   Procedure: IR WITH ANESTHESIA;  Surgeon: Radiologist, Medication, MD;  Location: [facility] OR;   Service: Radiology;  Laterality: Left;   RADIOLOGY WITH ANESTHESIA N/A 02/14/2023   Procedure: IR WITH ANESTHESIA;  Surgeon: Radiologist, Medication, MD;  Location: [facility] OR;  Service: Radiology;  Laterality: N/A;   Past Medical History:  Diagnosis Date   Cancer (HCC) 2002   prostate   CHF (congestive heart failure) (HCC)    Gout    Hypertension    Thyroid disease    BP 128/79   Pulse 68   Ht 5\' 9"  (1.753 m)   Wt 195 lb (88.5 kg)   SpO2 93%   BMI 28.80 kg/m   Opioid Risk Score:   Fall Risk Score:  `1  Depression screen PHQ 2/9      No data to display           Review of Systems  Musculoskeletal:  Positive for back pain and neck pain.  All other systems reviewed and are negative.     Objective:   Physical Exam    04/27/2023   11:24 AM 03/28/2023    6:29 AM 03/28/2023    5:00 AM  Vitals with BMI  Height 5\' 9"     Weight 195 lbs  195 lbs 9 oz  BMI 28.78    Systolic 128 166 161  Diastolic 79 103 103  Pulse 68 75 75     Gen: no distress, normal appearing HEENT: oral mucosa pink and moist, NCAT Chest: normal effort, normal rate of breathing Abd: soft, non-distended Ext: no edema noted Psych: pleasant, normal affect Skin: intact Neuro: Alert and awake, follows commands, cranial nerves II through XII grossly intact, mildly delayed responses RUE: 4+/5 Deltoid, 4+/5 Biceps, 4+/5 Triceps, 4+/5 Wrist Ext, 4+/5 Grip LUE: 3/5 Deltoid, 4-/5 Biceps, 3/5 Triceps, 4-/5 Wrist Ext, 4-/5 Grip RLE: HF 4+/5, KE 4+/5, ADF 4+/5, APF 4+/5 LLE: HF 4/5, KE 4/5, ADF4/5, APF 4/5 Sensation intact light touch in all 4 extremities.  Slight increased tone in left hand Musculoskeletal:  No joint swelling or tenderness noted Patient is sitting in a wheelchair      Assessment & Plan:   1) Right PCA scattered infarct with thalamic HTN punctate left PCA infarct status post TNK   - Provided numbers to schedule follow-up with neurology and cardiology  - Continue PT and OT  - Will  request speech therapy evaluation also as he reported occasionally had issues with altered swallowing 2) Hypertension  - Controlled  - Refill losartan for 30 days, he reports he is taking this currently.  Follow-up with PCP for continued refills 3) History of CHF   - No signs of fluid overload noted today 4) Insomnia  - Refill melatonin increased dose to 5 mg at bedtime as needed.  Patient reports this is helping but would like something little bit stronger  5) Pulmonary embolism / DVT left popliteal vein   - Refill Eliquis, advised follow-up with PCP for continued monitoring 6) H/O prostate cancer/overactive bladder  - Refill oxybutynin, reports Myrbetriq was too expensive  - Advised continued follow-up with PCP-possible he may able to get  Myrbetriq from the [place]

## 2023-04-30 ENCOUNTER — Other Ambulatory Visit (HOSPITAL_COMMUNITY): Payer: Self-pay

## 2023-05-05 ENCOUNTER — Ambulatory Visit: Payer: Medicare Other

## 2023-05-05 DIAGNOSIS — I639 Cerebral infarction, unspecified: Secondary | ICD-10-CM | POA: Diagnosis not present

## 2023-05-06 LAB — CUP PACEART REMOTE DEVICE CHECK
Date Time Interrogation Session: 20250422154513
Implantable Pulse Generator Implant Date: 20250207

## 2023-05-14 ENCOUNTER — Ambulatory Visit: Admitting: Cardiology

## 2023-05-14 ENCOUNTER — Ambulatory Visit: Attending: Cardiology | Admitting: Cardiology

## 2023-05-14 ENCOUNTER — Encounter: Payer: Self-pay | Admitting: Cardiology

## 2023-05-14 VITALS — BP 148/92 | HR 69 | Resp 16 | Ht 69.0 in | Wt 201.0 lb

## 2023-05-14 DIAGNOSIS — I77810 Thoracic aortic ectasia: Secondary | ICD-10-CM

## 2023-05-14 DIAGNOSIS — I2782 Chronic pulmonary embolism: Secondary | ICD-10-CM | POA: Diagnosis not present

## 2023-05-14 DIAGNOSIS — I63531 Cerebral infarction due to unspecified occlusion or stenosis of right posterior cerebral artery: Secondary | ICD-10-CM | POA: Diagnosis not present

## 2023-05-14 DIAGNOSIS — I82532 Chronic embolism and thrombosis of left popliteal vein: Secondary | ICD-10-CM

## 2023-05-14 DIAGNOSIS — I10 Essential (primary) hypertension: Secondary | ICD-10-CM

## 2023-05-14 DIAGNOSIS — I5032 Chronic diastolic (congestive) heart failure: Secondary | ICD-10-CM

## 2023-05-14 DIAGNOSIS — Z95818 Presence of other cardiac implants and grafts: Secondary | ICD-10-CM

## 2023-05-14 NOTE — Patient Instructions (Addendum)
 Medication Instructions:  Your physician recommends that you continue on your current medications as directed. Please refer to the Current Medication list given to you today.  *If you need a refill on your cardiac medications before your next appointment, please call your pharmacy*  Lab Work: None ordered today. If you have labs (blood work) drawn today and your tests are completely normal, you will receive your results only by: MyChart Message (if you have MyChart) OR A paper copy in the mail If you have any lab test that is abnormal or we need to change your treatment, we will call you to review the results.  Testing/Procedures: Your physician has requested that you have a chest CT without contrast in February 2026. Non-Cardiac CT scanning, (CAT scanning), is a noninvasive, special x-ray that produces cross-sectional images of the body using x-rays and a computer. CT scans help physicians diagnose and treat medical conditions. For some CT exams, a contrast material is used to enhance visibility in the area of the body being studied. CT scans provide greater clarity and reveal more details than regular x-ray exams.   Follow-Up: At [facility], you and your health needs are our priority.  As part of our continuing mission to provide you with exceptional heart care, we have created designated Provider Care Teams.  These Care Teams include your primary Cardiologist (physician) and Advanced Practice Providers (APPs -  Physician Assistants and Nurse Practitioners) who all work together to provide you with the care you need, when you need it.  Your next appointment:   March 2026  The format for your next appointment:   In Person  Provider:   Olinda Bertrand, [facility]  Other Instructions Call our office and let us  know the physician you will be following up with after determining with insurance.   We have sent a message to our device clinic to set you up in their clinic regarding the recent loop  recorder placement.

## 2023-05-14 NOTE — Progress Notes (Signed)
 Cardiology Office Note:  .   Date:  05/14/2023  ID:  Edward Rodgers, DOB March 03, 1950, MRN [id number] PCP:  Omie Bickers, MD  Former Cardiology Providers: [facility] Cardiology [facility] Providers Cardiologist:  Olinda Bertrand, DO , [facility] (established care 05/14/23) Electrophysiologist:  None  Click to update primary MD,subspecialty MD or APP then REFRESH:1}    Chief Complaint  Patient presents with   Hospitalization Follow-up    History of Present Illness: .   Edward Rodgers is a 73 y.o. 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  Patient has a history of cerebellar stroke and recently was hospitalized in February 2025 with a new onset of right PCA stroke.  Initially cardiology was consulted prior to transitioning to CIR for loop recorder implant to monitor for atrial fibrillation.  While in [facility] cardiology was reconsulted for pleuritic chest pain and shortness of breath.  Based on symptoms and physical examination findings a D-dimer was checked which was elevated and follow-up CT PE study was also positive for pulmonary embolism.  DVT study was also positive for deep venous thrombosis patient was started on anticoagulation.  During recent hospitalization patient reported history of systolic heart failure in 1996 - he confirms he was a heavy alcohol drinker at that time.  He has been sober since about 2001.  He reports he was suppose to have a heart catheterization at [facility] but it did not take place for reason unknown. It was recommended about 1 to 2 years.  He denies history of PCI.  He also denies history of MI.   Patient presents today for follow-up status post discharge.  Patient is accompanied by his wife at  today's office visit.  Since discharge he denies anginal chest pain or heart failure symptoms.  He not on nasal cannula oxygen and tolerating anticoagulation well given his DVT and PE.  He is currently working for physical therapy and doing well.  Patient states that he has not taken his morning blood pressure medications and that is why the numbers are not well-controlled.  Otherwise home SBP ranges between 120-132 mmHg.  Review of Systems: .   Review of Systems  Cardiovascular:  Negative for chest pain, claudication, irregular heartbeat, leg swelling, near-syncope, orthopnea, palpitations, paroxysmal nocturnal dyspnea and syncope.  Respiratory:  Negative for shortness of breath.   Hematologic/Lymphatic: Negative for bleeding problem.   Studies Reviewed:   EKG: EKG Interpretation Date/Time:  Thursday May 14 2023 10:37:50 EDT Ventricular Rate:  69 PR Interval:  174 QRS Duration:  98 QT Interval:  424 QTC Calculation: 454 R Axis:   25  Text Interpretation: Normal sinus rhythm Moderate voltage criteria for LVH, may be normal variant ( R in aVL , Sokolow-Lyon ) ST & T wave abnormality, consider inferior ischemia When compared with ECG of 03-Mar-2023 09:03, No significant change was found Confirmed by Olinda Bertrand [phone]) on 05/14/2023 10:43:20 AM  Echocardiogram: 02/15/23:  1. No evidence of LV thrombus. Definity  is administered. Left ventricular  ejection fraction, by estimation, is 55%. The left ventricle has normal  function. The left ventricle has no regional wall motion abnormalities.  Left ventricular diastolic  parameters are consistent with Grade I diastolic dysfunction (impaired  relaxation).   2. Right ventricular systolic function was  not well visualized. The right  ventricular size is not well visualized. Tricuspid regurgitation signal is  inadequate for assessing PA pressure.   3. The mitral valve is normal in structure. No evidence of mitral valve  regurgitation. No evidence  of mitral stenosis.   4. The aortic valve was not well visualized. Aortic valve regurgitation  is not visualized. No aortic stenosis is present.   5. Aortic dilatation noted. There is borderline dilatation of the aortic  root, measuring 36 mm. There is mild dilatation of the ascending aorta,  measuring 40 mm.   Stress Testing: None  RADIOLOGY: CT PE Study 03/04/2023 1. Large volume pulmonary embolus in both the right and left pulmonary arteries, lobar branches, and segmental branches. Clot burden is high and affects all lobes. Right ventricular to left ventricular ratio 1.2, abnormally elevated. Positive for acute PE with CT evidence of right heart strain consistent with at least submassive (intermediate risk) PE. The presence of right heart strain has been associated with an increased risk of morbidity and mortality. Please refer to the "Code PE Focused" order set in EPIC. 2. Small right pleural effusion with associated atelectasis. Mild atelectasis in the posterior basal segment left lower lobe. 3. Patchy ground-glass opacity anteriorly in the right upper lobe and peripherally in the right lower lobe, possibly from mild pulmonary hemorrhage. 4. Mild cardiomegaly. 5. Ascending thoracic aortic aneurysm 4.1 cm in diameter. Recommend annual imaging followup by CTA or MRA. This recommendation follows 2010 ACCF/AHA/AATS/ACR/ASA/SCA/SCAI/SIR/STS/SVM Guidelines for the Diagnosis and Management of Patients with Thoracic Aortic Disease. Circulation. 2010; 121: Z610-R604. Aortic aneurysm NOS (ICD10-I71.9) 6. Aortic and coronary atherosclerosis.   Aortic Atherosclerosis (ICD10-I70.0) and Emphysema (ICD10-J43.9).  LE Venous Duplex  03/04/2023 RIGHT:  There is no evidence of deep vein thrombosis in the lower extremity. No cystic structure found in the popliteal fossa.    LEFT:  Findings consistent with acute deep vein thrombosis involving the left popliteal vein. Findings consistent with  acute intramuscular thrombosis involving the left  soleal veins, and left intramuscular calf vein.   Risk Assessment/Calculations:   NA   Labs:       Latest Ref Rng & Units 03/23/2023    6:26 AM 03/19/2023    5:58 AM 03/16/2023    6:17 AM  CBC  WBC 4.0 - 10.5 K/uL 4.4  5.2  6.2   Hemoglobin 13.0 - 17.0 g/dL 54.0  98.1  19.1   Hematocrit 39.0 - 52.0 % 36.7  33.8  35.8   Platelets 150 - 400 K/uL 259  281  255        Latest Ref Rng & Units 03/26/2023    5:48 AM 03/23/2023    6:26 AM 03/21/2023    8:56 AM  BMP  Glucose 70 - 99 mg/dL 93  92  95   BUN 8 - 23 mg/dL 18  16  14    Creatinine 0.61 - 1.24 mg/dL 4.78  2.95  6.21   Sodium 135 - 145 mmol/L 136  139  139   Potassium 3.5 - 5.1 mmol/L 4.0  3.7  3.6   Chloride 98 - 111 mmol/L 101  104  107   CO2 22 - 32 mmol/L 28  28  25    Calcium  8.9 - 10.3 mg/dL 9.3  9.5  9.2       Latest Ref Rng & Units 03/26/2023    5:48 AM 03/23/2023    6:26 AM 03/21/2023    8:56 AM  CMP  Glucose 70 -  99 mg/dL 93  92  95   BUN 8 - 23 mg/dL 18  16  14    Creatinine 0.61 - 1.24 mg/dL 8.29  5.62  1.30   Sodium 135 - 145 mmol/L 136  139  139   Potassium 3.5 - 5.1 mmol/L 4.0  3.7  3.6   Chloride 98 - 111 mmol/L 101  104  107   CO2 22 - 32 mmol/L 28  28  25    Calcium  8.9 - 10.3 mg/dL 9.3  9.5  9.2     Lab Results  Component Value Date   CHOL 96 02/15/2023   HDL 37 (L) 02/15/2023   LDLCALC 30 02/15/2023   TRIG 146 02/15/2023   CHOLHDL 2.6 02/15/2023   No results for input(s): "LIPOA" in the last 8760 hours. No components found for: "NTPROBNP" No results for input(s): "PROBNP" in the last 8760 hours. No results for input(s): "TSH" in the last 8760 hours.  Physical Exam:    Today's Vitals   05/14/23 1033  BP: (!) 148/92  Pulse: 69  Resp: 16  SpO2: 96%  Weight: 201 lb (91.2 kg)  Height: 5\' 9"  (1.753 m)   Body mass index is 29.68 kg/m. Wt Readings from Last 3 Encounters:  05/14/23 201 lb (91.2 kg)  04/27/23 195 lb (88.5 kg)  03/28/23 195 lb  8.8 oz (88.7 kg)    Physical Exam  Constitutional: No distress. He appears chronically ill.  hemodynamically stable, presents in a wheelchair  Neck: No JVD present.  Cardiovascular: Normal rate, regular rhythm, S1 normal and S2 normal. Exam reveals no gallop, no S3 and no S4.  No murmur heard. Pulmonary/Chest: Effort normal and breath sounds normal. No stridor. He has no wheezes. He has no rales.  Musculoskeletal:        General: No edema.     Cervical back: Neck supple.  Skin: Skin is warm.   Impression & Recommendation(s):  Impression:   ICD-10-CM   1. Heart failure with improved ejection fraction (HFimpEF) (HCC)  I50.32 EKG 12-Lead    CANCELED: ECHOCARDIOGRAM COMPLETE    2. Acute ischemic right PCA stroke (HCC)  I63.531     3. Chronic pulmonary embolism  I27.82     4. Chronic deep vein thrombosis (DVT) of popliteal vein of left lower extremity (HCC)  I82.532     5. Ascending aorta dilatation (HCC)  I77.810 CT Chest Wo Contrast    6. Benign hypertension  I10 CANCELED: ECHOCARDIOGRAM COMPLETE    7. Status post placement of implantable loop recorder  Z95.818        Recommendation(s):  Heart failure with improved ejection fraction (HFimpEF) (HCC) Documented history of heart failure likely secondary to excessive alcohol consumption. Most recent echocardiogram notes mildly reduced LVEF at 40-45% with global hypokinesis. Continue GDMT. Continue losartan  50 mg p.o. daily. Continue Toprol -XL 100 mg p.o. daily. Continue spironolactone  12.5 mg p.o. daily. Continue amlodipine  10 mg p.o. daily Depending on who he follows up longitudinally for his cardiovascular care [facility] versus the [facility] his GDMT could be further uptitrated.  Would recommend transitioning amlodipine  to SGLT2 inhibitors if no contraindications.  Transitioning his losartan  to ARNI.  Acute ischemic right PCA stroke (HCC) Status post TNK. Reemphasized importance of secondary prevention. Underwent  loop recorder implant. Follows with PCP and recommended that he continues care with neurology  Chronic pulmonary embolism Chronic deep vein thrombosis (DVT) of popliteal vein of left lower extremity [facility]) Cardiology was consulted during his hospitalization  in CIR due to chest pain and shortness of breath.  Chest pain was predominantly pleuritic and given his shortness of breath underwent workup for DVT and PE.  Noted to have extensive clots bilaterally as well as DVT involving the left lower extremity.  Currently on Eliquis  5 mg p.o. twice daily. Recommend therapy for at least 6 months. He will need to establish care with  hematology and/or pulmonary medicine to determine the duration of anticoagulation given his history.  I have asked him to follow-up with PCP for further guidance.  Ascending aorta dilatation (HCC) Incidental finding of ascending aortic dilatation 41 mm in February 2025. Home blood pressures are well-controlled. Will repeat CT study in February 2026 You should avoid use of Ciprofloxacin and other associated antibiotics (flouroquinolone antibiotics ) It is  best to avoid activities that cause grunting or straining (medically referred to as a "valsalva maneuver"). This happens when a person bears down against a closed throat to increase the strength of arm or abdominal muscles. There's often a tendency to do this when lifting heavy weights, doing sit-ups, push-ups or chin-ups, etc., but it may be harmful.   Benign hypertension Office blood pressures are not at goal as he did not take his morning medications as he was running late for that visit. Otherwise home blood pressures are well-controlled as mentioned above.  Medications as discussed above  Status post placement of implantable loop recorder RN will send a message to device clinic to make sure his ILR is being followed.  As part of today's office visit reviewed consultation note 03/03/2023, CT PE study from February 2025,  lower extremity venous duplex from February 2025, discharge summary, EKG, medication reconciliation and prescription drug management, coordination of care, ordering of additional diagnostic workup for reasons mentioned above.  Orders Placed:  Orders Placed This Encounter  Procedures   CT Chest Wo Contrast    Standing Status:   Future    Expected Date:   02/15/2024    Expiration Date:   05/13/2024    [facility]   EKG 12-Lead     Final Medication List:   No orders of the defined types were placed in this encounter.   Medications Discontinued During This Encounter  Medication Reason   alendronate (FOSAMAX) 70 MG tablet Patient Preference   oxybutynin  (DITROPAN -XL) 10 MG 24 hr tablet Patient Preference   QUEtiapine  (SEROQUEL ) 50 MG tablet Patient Preference     Current Outpatient Medications:    allopurinol  (ZYLOPRIM ) 300 MG tablet, Take 300 mg by mouth every morning., Disp: , Rfl:    amLODipine  (NORVASC ) 10 MG tablet, Take 1 tablet (10 mg total) by mouth daily., Disp: 30 tablet, Rfl: 0   apixaban  (ELIQUIS ) 5 MG TABS tablet, Take 1 tablet (5 mg total) by mouth 2 (two) times daily., Disp: 60 tablet, Rfl: 0   atorvastatin  (LIPITOR ) 80 MG tablet, Take 1 tablet (80 mg total) by mouth daily., Disp: 30 tablet, Rfl: 0   fluticasone  (FLONASE ) 50 MCG/ACT nasal spray, Place 1 spray into both nostrils daily., Disp: , Rfl:    latanoprost  (XALATAN ) 0.005 % ophthalmic solution, Place 1 drop into the right eye at bedtime., Disp: 2.5 mL, Rfl: 12   levothyroxine  (SYNTHROID ) 88 MCG tablet, Take 1 tablet (88 mcg total) by mouth daily before breakfast., Disp: 30 tablet, Rfl: 0   lidocaine  (LIDODERM ) 5 %, Place 1 patch onto the skin daily. Remove & Discard patch within 12 hours or as  directed by MD, Disp: 14 patch, Rfl: 1   losartan  (COZAAR ) 50 MG tablet, Take 1 tablet (50 mg total) by mouth daily., Disp: 30 tablet, Rfl: 0   melatonin 5 MG TABS, Take 1 tablet (5 mg  total) by mouth at bedtime., Disp: 30 tablet, Rfl: 0   metoprolol  succinate (TOPROL -XL) 100 MG 24 hr tablet, Take 1 tablet (100 mg total) by mouth daily. Take with or immediately following a meal., Disp: 30 tablet, Rfl: 0   spironolactone  (ALDACTONE ) 25 MG tablet, Take 0.5 tablets (12.5 mg total) by mouth daily., Disp: 15 tablet, Rfl: 0   venlafaxine  XR (EFFEXOR -XR) 150 MG 24 hr capsule, Take 1 capsule (150 mg total) by mouth every evening., Disp: 30 capsule, Rfl: 0  Consent:   NA  Disposition:   March 2026 or sooner if needed. I advised the patient that he does not need to see 2 cardiologist 1 in the [place] and another one within the community.  They will discuss coverage with their insurance company and make a shared decision.  His questions and concerns were addressed to his satisfaction. He voices understanding of the recommendations provided during this encounter.    Signed, Olinda Bertrand, [place], [facility]  [facility]  05/14/2023 11:16 AM

## 2023-05-17 ENCOUNTER — Encounter: Payer: Self-pay | Admitting: Cardiology

## 2023-05-17 NOTE — Addendum Note (Signed)
 Addended by: Lott Rouleau A on: 05/17/2023 04:53 AM   Modules accepted: Orders

## 2023-05-17 NOTE — Progress Notes (Signed)
 [facility] Summary Report / Loop Recorder

## 2023-05-18 ENCOUNTER — Telehealth: Payer: Self-pay | Admitting: Diagnostic Neuroimaging

## 2023-05-18 NOTE — Telephone Encounter (Signed)
 Pt's wife called to verify appointment

## 2023-05-19 ENCOUNTER — Encounter: Payer: Self-pay | Admitting: Diagnostic Neuroimaging

## 2023-05-19 ENCOUNTER — Ambulatory Visit: Payer: Self-pay | Admitting: Diagnostic Neuroimaging

## 2023-05-19 VITALS — BP 138/84 | HR 64 | Ht 69.0 in | Wt 201.0 lb

## 2023-05-19 DIAGNOSIS — I63433 Cerebral infarction due to embolism of bilateral posterior cerebral arteries: Secondary | ICD-10-CM

## 2023-05-19 DIAGNOSIS — G959 Disease of spinal cord, unspecified: Secondary | ICD-10-CM | POA: Diagnosis not present

## 2023-05-19 NOTE — Progress Notes (Unsigned)
 [facility]  PATIENT: Edward Rodgers DOB: November 24, 1950  REFERRING CLINICIAN: Omie Bickers, MD HISTORY FROM: PATIENT  REASON FOR VISIT: NEW CONSULT   HISTORICAL  CHIEF COMPLAINT:  Chief Complaint  Patient presents with   New Patient (Initial Visit)    Rm 6, Pt w/wife. Pt here for f/u stroke & hospital. Currently doing PT & OT. Waiting on ST eval. Pt currently using walker & wheelchair at home. Typically uses wheelchair when out away from home.    HISTORY OF PRESENT ILLNESS:   73 year old male with congestive heart failure, history of stroke, prostate cancer, tobacco abuse, hyperlipidemia, hypertension, presented to hospital on 02/14/23 with left-sided weakness, left-sided numbness, right gaze preference, found to have right and left posterior cerebral artery ischemic infarctions.  He was treated with TNK and interventional radiology mechanical thrombectomy.  Stroke workup was completed.  He went to inpatient rehabilitation.  Now using walker and wheelchair at home.  Getting PT and OT.  Patient was also found to have chest pain and shortness of breath, elevated D-dimer, found to  have pulmonary embolism.  He was started on anticoagulation.  Prior to admission patient was having progressive gait and balance difficulty.  He was found to have severe cervical spinal stenosis on MRI cervical spine from the [facility] in [place].  Has not been to neurosurgery consult yet.   REVIEW OF SYSTEMS: Full 14 system review of systems performed and negative with exception of: as per HPI.  ALLERGIES: No Known Allergies  HOME MEDICATIONS: Outpatient Medications Prior to Visit  Medication Sig Dispense Refill   alendronate (FOSAMAX) 70 MG tablet Take 70 mg by mouth once a week. Take with a full glass of water  on an empty stomach.     allopurinol  (ZYLOPRIM ) 300 MG tablet Take 300 mg by mouth every morning.     amLODipine  (NORVASC ) 10 MG tablet Take 1 tablet (10 mg total) by  mouth daily. 30 tablet 0   amoxicillin-clavulanate (AUGMENTIN) 875-125 MG tablet Take 1 tablet by mouth 2 (two) times daily.     apixaban  (ELIQUIS ) 5 MG TABS tablet Take 1 tablet (5 mg total) by mouth 2 (two) times daily. 60 tablet 0   atorvastatin  (LIPITOR ) 80 MG tablet Take 1 tablet (80 mg total) by mouth daily. 30 tablet 0   fluticasone  (FLONASE ) 50 MCG/ACT nasal spray Place 1 spray into both nostrils daily.     latanoprost  (XALATAN ) 0.005 % ophthalmic solution Place 1 drop into the right eye at bedtime. 2.5 mL 12   levothyroxine  (SYNTHROID ) 88 MCG tablet Take 1 tablet (88 mcg total) by mouth daily before breakfast. 30 tablet 0   lidocaine  (LIDODERM ) 5 % Place 1 patch onto the skin daily. Remove & Discard patch within 12 hours or as directed by MD 14 patch 1   losartan  (COZAAR ) 50 MG tablet Take 1 tablet (50 mg total) by mouth daily. 30 tablet 0   melatonin 5 MG TABS Take 1 tablet (5 mg total) by mouth at bedtime. 30 tablet 0   metoprolol  succinate (TOPROL -XL) 100 MG 24 hr tablet Take 1 tablet (100 mg total) by mouth daily. Take with or immediately following a meal. 30 tablet 0   mirabegron  ER (MYRBETRIQ ) 50 MG TB24 tablet Take 50 mg by mouth daily.     spironolactone  (ALDACTONE ) 25 MG tablet Take 0.5 tablets (12.5 mg total) by mouth daily. 15 tablet 0   venlafaxine  XR (EFFEXOR -XR) 150 MG 24 hr capsule Take 1 capsule (  150 mg total) by mouth every evening. 30 capsule 0   No facility-administered medications prior to visit.    PAST MEDICAL HISTORY: Past Medical History:  Diagnosis Date   Cancer (HCC) 2002   prostate   CHF (congestive heart failure) (HCC)    Gout    Hypertension    Stroke (HCC)    Thyroid  disease     PAST SURGICAL HISTORY: Past Surgical History:  Procedure Laterality Date   COLONOSCOPY N/A 07/15/2017   Procedure: COLONOSCOPY;  Surgeon: Ruby Corporal, MD;  [facility];  Service: Endoscopy;  Laterality: N/A;  830   IR CT HEAD LTD  02/14/2023   IR  PERCUTANEOUS ART THROMBECTOMY/INFUSION INTRACRANIAL INC DIAG ANGIO  02/14/2023   LOOP RECORDER INSERTION N/A 02/20/2023   Procedure: LOOP RECORDER INSERTION;  Surgeon: Verona Goodwill, MD;  Location: [facility] INVASIVE CV LAB;  Service: Cardiovascular;  Laterality: N/A;   POLYPECTOMY  07/15/2017   Procedure: POLYPECTOMY;  Surgeon: Ruby Corporal, MD;  Location: [facility];  Service: Endoscopy;;  colon   PROSTATE SURGERY     RADIOLOGY WITH ANESTHESIA Left 02/14/2023   Procedure: IR WITH ANESTHESIA;  Surgeon: Radiologist, Medication, MD;  Location: [facility] OR;  Service: Radiology;  Laterality: Left;   RADIOLOGY WITH ANESTHESIA N/A 02/14/2023   Procedure: IR WITH ANESTHESIA;  Surgeon: Radiologist, Medication, MD;  Location: [facility] OR;  Service: Radiology;  Laterality: N/A;    FAMILY HISTORY: History reviewed. No pertinent family history.  SOCIAL HISTORY: Social History   Socioeconomic History   Marital status: Married    Spouse name: Not on file   Number of children: Not on file   Years of education: Not on file   Highest education level: Not on file  Occupational History   Not on file  Tobacco Use   Smoking status: Former   Smokeless tobacco: Never  Vaping Use   Vaping status: Never Used  Substance and Sexual Activity   Alcohol use: No   Drug use: No   Sexual activity: Not on file  Other Topics Concern   Not on file  Social History Narrative   Not on file   Social Drivers of Health   Financial Resource Strain: Not on file  Food Insecurity: Patient Declined (02/15/2023)   Hunger Vital Sign    Worried About Running Out of Food in the Last Year: Patient declined    Ran Out of Food in the Last Year: Patient declined  Transportation Needs: Patient Unable To Answer (02/15/2023)   PRAPARE - Administrator, Civil Service (Medical): Patient unable to answer    Lack of Transportation (Non-Medical): Patient unable to answer  Physical Activity: Not on file  Stress: Not on file  Social  Connections: Patient Unable To Answer (02/15/2023)   Social Connection and Isolation Panel [NHANES]    Frequency of Communication with Friends and Family: Patient unable to answer    Frequency of Social Gatherings with Friends and Family: Patient unable to answer    Attends Religious Services: Patient unable to answer    Active Member of Clubs or Organizations: Patient unable to answer    Attends Banker Meetings: Patient unable to answer    Marital Status: Patient unable to answer  Intimate Partner Violence: Patient Unable To Answer (02/15/2023)   Humiliation, Afraid, Rape, and Kick questionnaire    Fear of Current or Ex-Partner: Patient unable to answer    Emotionally Abused: Patient unable to answer  Physically Abused: Patient unable to answer    Sexually Abused: Patient unable to answer     PHYSICAL EXAM  GENERAL EXAM/CONSTITUTIONAL: Vitals:  Vitals:   05/19/23 1024  BP: 138/84  Pulse: 64  Weight: 201 lb (91.2 kg)  Height: 5\' 9"  (1.753 m)   Body mass index is 29.68 kg/m. Wt Readings from Last 3 Encounters:  05/19/23 201 lb (91.2 kg)  05/14/23 201 lb (91.2 kg)  04/27/23 195 lb (88.5 kg)   Patient is in no distress; well developed, nourished and groomed; neck is supple  CARDIOVASCULAR: Examination of carotid arteries is normal; no carotid bruits Regular rate and rhythm, no murmurs Examination of peripheral vascular system by observation and palpation is normal  EYES: Ophthalmoscopic exam of optic discs and posterior segments is normal; no papilledema or hemorrhages No results found.  MUSCULOSKELETAL: Gait, strength, tone, movements noted in Neurologic exam below  NEUROLOGIC: MENTAL STATUS:      No data to display         awake, alert, oriented to person, place and time recent and remote memory intact normal attention and concentration language fluent, comprehension intact, naming intact fund of knowledge appropriate  CRANIAL NERVE:  2nd -  no papilledema on fundoscopic exam 2nd, 3rd, 4th, 6th - pupils equal and reactive to light, visual fields full to confrontation, extraocular muscles intact, no nystagmus 5th - facial sensation symmetric 7th - facial strength symmetric 8th - hearing intact 9th - palate elevates symmetrically, uvula midline 11th - shoulder shrug symmetric 12th - tongue protrusion midline  MOTOR:  normal bulk and tone, full strength in the BUE, BLE; EXCEPT LEFT UPPER EXT TRICEPS / BICEPS 4, FINGER ABDUCTION 3; LEFT HIP FLEX 4  SENSORY:  normal and symmetric to light touch, temperature, vibration  COORDINATION:  finger-nose-finger, fine finger movements normal  REFLEXES:  deep tendon reflexes 1+ and symmetric  GAIT/STATION:  IN WHEELCHAIR; NEEDS 2 PERSON ASSIST TO STAND; VERY UNSTEADY     DIAGNOSTIC DATA (LABS, IMAGING, TESTING) - I reviewed patient records, labs, notes, testing and imaging myself where available.  Lab Results  Component Value Date   WBC 4.4 03/23/2023   HGB 11.4 (L) 03/23/2023   HCT 36.7 (L) 03/23/2023   MCV 89.7 03/23/2023   PLT 259 03/23/2023      Component Value Date/Time   NA 136 03/26/2023 0548   K 4.0 03/26/2023 0548   CL 101 03/26/2023 0548   CO2 28 03/26/2023 0548   GLUCOSE 93 03/26/2023 0548   BUN 18 03/26/2023 0548   CREATININE 1.00 03/26/2023 0548   CALCIUM  9.3 03/26/2023 0548   PROT 7.1 02/23/2023 0507   ALBUMIN 3.5 02/23/2023 0507   AST 16 02/23/2023 0507   ALT 18 02/23/2023 0507   ALKPHOS 52 02/23/2023 0507   BILITOT 0.6 02/23/2023 0507   GFRNONAA >60 03/26/2023 0548   GFRAA >90 03/29/2013 0711   Lab Results  Component Value Date   CHOL 96 02/15/2023   HDL 37 (L) 02/15/2023   LDLCALC 30 02/15/2023   TRIG 146 02/15/2023   CHOLHDL 2.6 02/15/2023   Lab Results  Component Value Date   HGBA1C 6.4 (H) 02/15/2023   No results found for: "VITAMINB12" No results found for: "TSH"  02/05/23 MRI cervical / thoracic spine [facility], report  only] 1. Severe spinal stenosis at C3-C4 and C4-C5 with questionable  increased cord signal at C4-C5, which raises the possibility of  early spinal cord edema or developing myelomalacia. Recommend  neurosurgery consultation.  2. Severe neuroforaminal narrowing on the left at C3-C4 and C4-C5.  3. Dextroconvex scoliotic curvature of the cervical spine and  levoconvex curvature of the thoracic spine.  4. No significant spinal or neural foraminal narrowing in the  thoracic spine.      ASSESSMENT AND PLAN  73 y.o. year old male here with:   Dx:  1. Cervical myelopathy (HCC)   2. Cerebrovascular accident (CVA) due to bilateral embolism of posterior cerebral arteries (HCC)       PLAN:  Stroke:  Right PCA scattered infarct with thalamic HT and punctate left PCA infarcts with R P2 occlusion s/p TNK and IR are with TICI3 repeat fusion, etiology: Concerning for cardioembolic source Code Stroke CT head - No acute abnormality.  3 cm anterior left parafalcine meningioma, enlarged from 2015. CTA head & neck w/Perfusion Right P2 occlusion. Extensive intracranial atherosclerosis at bilateral ICA bulbs and siphons. CTP 0/25cc  MRI Diffusion hyperintensity in the right PCA distribution affecting the medial temporal lobe and thalamus. FLAIR hyperintensity is not yet detected. left anterior parafalcine meningioma. NIHSS 11->3->7 Repeat CTH Subtle but likely cytotoxic edema now seen at areas of right temporal and thalamic diffusion abnormality Repeat CT Perfusion 0/36 S/p IR with right distal P2 occlusion and TICI3 reperfusion MRI Progressive acute right PCA territory infarct with extension most notable in the right periatrial white matter and occipital lobe. Suspected subcentimeter hemorrhage centrally in the right thalamic infarct. New punctate acute infarcts in the left occipital lobe. 2/3 CT repeat  Involving cytotoxic edema in the right thalamus and right occipital lobe at the site of known  infarcts. No acute hemorrhage. Unchanged large left anterior para falcine calcified meningioma. 2D Echo EF 55%, no LV thrombus LE venous Doppler no DVT Loop recorder placed 02/20/23 LDL 30 HgbA1c 6.4 UDS negative VTE prophylaxis - Lovenox  aspirin  325 mg daily prior to admission, then discharged on ASA 81 and plavix  DAPT for 3 weeks and then plavix  alone --> now on apixaban  for PULMONARY EMBOLISM (planned for 6 months; then would need to go back to plavix  75mg  if anticoagulation is stopped in future)   History of stroke 03/2013 right PICA small infarct, EF 35 to 40%, LDL 112.  Discharged on aspirin  81 and statin.   History of CHF 03/2013 2D echo EF 35 to 40% Home medication including metoprolol , spironolactone  2D echo this admission EF 55% Resumed home metoprolol  and spironolactone  Loop recorder placed 02/20/23   Hypertension Continue metoprolol , spironolactone , amlodipine  10   Hyperlipidemia Home meds: Lipitor  80 LDL 30, goal < 70 Continue Lipitor  80  Severe cervical spinal stenosis / myelopathy (C3-4 stenosis on CTA neck) MRI C-spine exam report at [facility] --> showed severe cervical stenosis with early sign of myelopathy Recommend neurosurgery consult (already ordered via PCP)    Orders Placed This Encounter  Procedures   Ambulatory referral to Neurosurgery   Return for return to PCP, pending if symptoms worsen or fail to improve.    Omega Bible, MD 05/19/2023, 11:01 AM Certified in Neurology, Neurophysiology and Neuroimaging  [facility] [address] [place], [place] [address] [phone]

## 2023-05-19 NOTE — Patient Instructions (Addendum)
 STROKE PREVENTION - now on apixaban  for PULMONARY EMBOLISM (planned for 6 months; then would need to go back to plavix  75mg  if anticoagulation is stopped in future) - continue BP control, lipid control - continue loop recorder monitoring  Severe cervical spinal stenosis / myelopathy (C3-4 stenosis on CTA neck) MRI C-spine exam report at [facility] --> showed severe cervical stenosis with early sign of myelopathy Recommend neurosurgery consult (via PCP)

## 2023-05-20 ENCOUNTER — Encounter: Payer: Self-pay | Admitting: Diagnostic Neuroimaging

## 2023-05-21 ENCOUNTER — Telehealth: Payer: Self-pay | Admitting: Diagnostic Neuroimaging

## 2023-05-21 NOTE — Telephone Encounter (Signed)
 Referral sent to [facility], phone #: [phone] fax#: [phone].

## 2023-05-26 ENCOUNTER — Other Ambulatory Visit (HOSPITAL_COMMUNITY): Payer: Self-pay

## 2023-05-26 MED ORDER — ELIQUIS 5 MG PO TABS
5.0000 mg | ORAL_TABLET | Freq: Two times a day (BID) | ORAL | 3 refills | Status: AC
  Filled 2023-05-26: qty 60, 30d supply, fill #0

## 2023-05-26 MED ORDER — MELATONIN 5 MG PO TABS
5.0000 mg | ORAL_TABLET | Freq: Every day | ORAL | 3 refills | Status: AC
  Filled 2023-05-26: qty 90, 90d supply, fill #0

## 2023-05-27 ENCOUNTER — Other Ambulatory Visit (HOSPITAL_COMMUNITY): Payer: Self-pay

## 2023-05-27 MED ORDER — LIDOCAINE 5 % EX PTCH
1.0000 | MEDICATED_PATCH | Freq: Every day | CUTANEOUS | 1 refills | Status: AC | PRN
  Filled 2023-05-27: qty 30, 30d supply, fill #0
  Filled 2023-11-07: qty 30, 30d supply, fill #1

## 2023-06-02 ENCOUNTER — Telehealth: Payer: Self-pay

## 2023-06-02 ENCOUNTER — Other Ambulatory Visit (HOSPITAL_COMMUNITY): Payer: Self-pay

## 2023-06-02 NOTE — Patient Outreach (Signed)
 First telephone outreach attempt to obtain mRS. No answer. Left message for returned call.  [facility]  Population Health Care Management Assistant  Direct Dial: [phone]  Fax: [phone] Website: [email or web address]

## 2023-06-03 ENCOUNTER — Telehealth: Payer: Self-pay

## 2023-06-03 NOTE — Patient Outreach (Signed)
 Patient returned called, obtain mRS was successfully completed. MRS= 1  [facility] Care Management Assistant  Direct Dial: [phone]  Fax: [phone] Website: [email or web address]

## 2023-06-04 ENCOUNTER — Other Ambulatory Visit (HOSPITAL_COMMUNITY): Payer: Self-pay | Admitting: Neurological Surgery

## 2023-06-04 DIAGNOSIS — M542 Cervicalgia: Secondary | ICD-10-CM

## 2023-06-09 ENCOUNTER — Ambulatory Visit (INDEPENDENT_AMBULATORY_CARE_PROVIDER_SITE_OTHER): Payer: Medicare Other

## 2023-06-09 DIAGNOSIS — I639 Cerebral infarction, unspecified: Secondary | ICD-10-CM | POA: Diagnosis not present

## 2023-06-09 LAB — CUP PACEART REMOTE DEVICE CHECK
Date Time Interrogation Session: 20250526233422
Implantable Pulse Generator Implant Date: 20250207

## 2023-06-10 ENCOUNTER — Ambulatory Visit: Payer: Self-pay | Admitting: Cardiology

## 2023-06-11 ENCOUNTER — Ambulatory Visit (HOSPITAL_COMMUNITY)
Admission: RE | Admit: 2023-06-11 | Discharge: 2023-06-11 | Disposition: A | Discharge status: Home / Self Care | Source: Ambulatory Visit | Attending: Neurological Surgery | Admitting: Neurological Surgery

## 2023-06-11 DIAGNOSIS — M542 Cervicalgia: Secondary | ICD-10-CM | POA: Diagnosis present

## 2023-06-18 NOTE — Progress Notes (Signed)
 [facility] Summary Report / Loop Recorder

## 2023-06-18 NOTE — Addendum Note (Signed)
 Addended by: Lott Rouleau A on: 06/18/2023 01:42 PM   Modules accepted: Orders

## 2023-07-09 ENCOUNTER — Ambulatory Visit (INDEPENDENT_AMBULATORY_CARE_PROVIDER_SITE_OTHER)

## 2023-07-09 DIAGNOSIS — I639 Cerebral infarction, unspecified: Secondary | ICD-10-CM | POA: Diagnosis not present

## 2023-07-09 LAB — CUP PACEART REMOTE DEVICE CHECK
Date Time Interrogation Session: 20250625234518
Implantable Pulse Generator Implant Date: 20250207

## 2023-07-12 ENCOUNTER — Ambulatory Visit: Payer: Self-pay | Admitting: Cardiology

## 2023-07-27 ENCOUNTER — Encounter: Payer: Self-pay | Admitting: Physical Medicine & Rehabilitation

## 2023-07-27 ENCOUNTER — Telehealth: Payer: Self-pay | Admitting: Physical Medicine & Rehabilitation

## 2023-07-27 ENCOUNTER — Encounter: Attending: Physical Medicine & Rehabilitation | Admitting: Physical Medicine & Rehabilitation

## 2023-07-27 VITALS — BP 114/79 | HR 73 | Ht 69.0 in | Wt 187.2 lb

## 2023-07-27 DIAGNOSIS — M792 Neuralgia and neuritis, unspecified: Secondary | ICD-10-CM | POA: Diagnosis present

## 2023-07-27 DIAGNOSIS — R1312 Dysphagia, oropharyngeal phase: Secondary | ICD-10-CM | POA: Insufficient documentation

## 2023-07-27 DIAGNOSIS — I63531 Cerebral infarction due to unspecified occlusion or stenosis of right posterior cerebral artery: Secondary | ICD-10-CM | POA: Diagnosis not present

## 2023-07-27 DIAGNOSIS — N3281 Overactive bladder: Secondary | ICD-10-CM | POA: Insufficient documentation

## 2023-07-27 MED ORDER — GABAPENTIN 100 MG PO CAPS: 100.0000 mg | ORAL_CAPSULE | Freq: Three times a day (TID) | ORAL | 2 refills | Status: DC

## 2023-07-27 MED ORDER — GABAPENTIN 100 MG PO CAPS: 100.0000 mg | ORAL_CAPSULE | Freq: Three times a day (TID) | ORAL | Status: DC

## 2023-07-27 NOTE — Progress Notes (Signed)
 [facility] Summary Report / Loop Recorder

## 2023-07-27 NOTE — Telephone Encounter (Signed)
 Edward Rodgers called and asked if the referral that was sent was supposed to be a modified swallow if so 2 codes need to be listed

## 2023-07-27 NOTE — Progress Notes (Addendum)
 Subjective:    Patient ID: Edward Rodgers, male    DOB: 1950-02-22, 73 y.o.   MRN: [id number]  HPI  Edward Rodgers is here with his partner here for follow-up after completing treatment at [facility].  Edward Rodgers reports he has been doing overall well.  He still working with physical and Occupational Therapy, continues to require some help from his wife for ADLs.  Pain is overall well-controlled.  He is out of several of his medications and would like a refill.  He has a follow-up scheduled with his PCP next month but will run out of medications before then.  His blood pressure has been well-controlled.  Continues to use oxybutynin  to help reduce incontinence.  She has not followed up yet with cardiology or neurology.  Interval history 07/27/23 Patient reports [facility] and had several medications continued.  Reports he would like to follow-up with cardiology regarding cardiac monitor.  I gave him the number to call cardiology.  Patient reports he still working with PT and OT, member of office staff did call his home health agency and appears therapy sessions are completed tomorrow.  He has not had speech therapy yet, reports continued difficulty with swallowing.  Has also been having some burning/tingling pain in his bilateral hands since his prior stroke.  Pain is constant and worsened with activities.   Pain Inventory Average Pain 7 Pain Right Now 5 My pain is sharp, burning Pain is worse morning, daytime, evening and night Sleep is fair due to pain Pain is worth with some activities Pain improves with Lidocaine  Patches Pain relief from Lidocaine  Patches (10)  LOCATION OF PAIN  Left shoulder, bilateral hands  BOWEL Number of stools per week: 10-14 Oral laxative use No  Type of laxative . Enema or suppository use No  History of colostomy No  Incontinent No   BLADDER Pads In and out cath, frequency . Able to self cath . Bladder incontinence No  Frequent  urination Yes  Leakage with coughing No  Difficulty starting stream No  Incomplete bladder emptying No    Mobility walk with assistance use a walker how many minutes can you walk? 2-3 ability to climb steps?  no do you drive?  no  Function retired  Neuro/Psych trouble walking  Prior Studies Hospital f/u  Physicians involved in your care Hospital f/u   No family history on file. Social History   Socioeconomic History   Marital status: Married    Spouse name: Not on file   Number of children: Not on file   Years of education: Not on file   Highest education level: Not on file  Occupational History   Not on file  Tobacco Use   Smoking status: Former   Smokeless tobacco: Never  Vaping Use   Vaping status: Never Used  Substance and Sexual Activity   Alcohol use: No   Drug use: No   Sexual activity: Not on file  Other Topics Concern   Not on file  Social History Narrative   Not on file   Social Drivers of Health   Financial Resource Strain: Not on file  Food Insecurity: Patient Declined (02/15/2023)   Hunger Vital Sign    Worried About Running Out of Food in the Last Year: Patient declined    Ran Out of Food in the Last Year: Patient declined  Transportation Needs: Patient Unable To Answer (02/15/2023)   PRAPARE - Transportation  Lack of Transportation (Medical): Patient unable to answer    Lack of Transportation (Non-Medical): Patient unable to answer  Physical Activity: Not on file  Stress: Not on file  Social Connections: Patient Unable To Answer (02/15/2023)   Social Connection and Isolation Panel    Frequency of Communication with Friends and Family: Patient unable to answer    Frequency of Social Gatherings with Friends and Family: Patient unable to answer    Attends Religious Services: Patient unable to answer    Active Member of Clubs or Organizations: Patient unable to answer    Attends Banker Meetings: Patient unable to answer     Marital Status: Patient unable to answer   Past Surgical History:  Procedure Laterality Date   COLONOSCOPY N/A 07/15/2017   Procedure: COLONOSCOPY;  Surgeon: Golda Claudis PENNER, MD;  [facility];  Service: Endoscopy;  Laterality: N/A;  830   IR CT HEAD LTD  02/14/2023   IR PERCUTANEOUS ART THROMBECTOMY/INFUSION INTRACRANIAL INC DIAG ANGIO  02/14/2023   LOOP RECORDER INSERTION N/A 02/20/2023   Procedure: LOOP RECORDER INSERTION;  Surgeon: Fernande Elspeth BROCKS, MD;  Location: [facility] INVASIVE CV LAB;  Service: Cardiovascular;  Laterality: N/A;   POLYPECTOMY  07/15/2017   Procedure: POLYPECTOMY;  Surgeon: Golda Claudis PENNER, MD;  [facility];  Service: Endoscopy;;  colon   PROSTATE SURGERY     RADIOLOGY WITH ANESTHESIA Left 02/14/2023   Procedure: IR WITH ANESTHESIA;  Surgeon: Radiologist, Medication, MD;  [facility]: [facility];  Service: Radiology;  Laterality: Left;   RADIOLOGY WITH ANESTHESIA N/A 02/14/2023   Procedure: IR WITH ANESTHESIA;  Surgeon: Radiologist, Medication, MD;  Location: [facility];  Service: Radiology;  Laterality: N/A;   Past Medical History:  Diagnosis Date   Cancer (HCC) 2002   prostate   CHF (congestive heart failure) (HCC)    Gout    Hypertension    Stroke (HCC)    Thyroid  disease    BP 114/79 (BP Location: Left Arm, Patient Position: Sitting, Cuff Size: Large)   Pulse 73   Ht 5' 9 (1.753 m)   Wt 187 lb 3.2 oz (84.9 kg)   SpO2 95%   BMI 27.64 kg/m   Opioid Risk Score:   Fall Risk Score:  `1  Depression screen [facility] 2/9     07/27/2023    1:03 PM  Depression screen PHQ 2/9  Decreased Interest 1  Down, Depressed, Hopeless 1  PHQ - 2 Score 2  Altered sleeping 0  Tired, decreased energy 0  Change in appetite 0  Feeling bad or failure about yourself  0  Trouble concentrating 0  Moving slowly or fidgety/restless 0  Suicidal thoughts 0  PHQ-9 Score 2  Difficult doing work/chores Somewhat difficult     Review of Systems  Musculoskeletal:  Positive for back  pain and neck pain.  All other systems reviewed and are negative.      Objective:   Physical Exam    07/27/2023    1:02 PM 05/19/2023   10:24 AM 05/14/2023   10:33 AM  Vitals with BMI  Height 5' 9 5' 9 5' 9  Weight 187 lbs 3 oz 201 lbs 201 lbs  BMI 27.63 29.67 29.67  Systolic 114 138 851  Diastolic 79 84 92  Pulse 73 64 69     Gen: no distress, normal appearing HEENT: oral mucosa pink and moist, NCAT Chest: normal effort, normal rate of breathing Abd: soft, non-distended Ext: no edema noted  Psych: pleasant, normal affect Skin: intact Neuro: Alert and awake, follows commands, cranial nerves II through XII grossly intact, mildly delayed responses, memory deficits noted RUE: 4+/5 Deltoid, 4+/5 Biceps, 4+/5 Triceps, 4+/5 Wrist Ext, 4+/5 Grip LUE: 3/5 Deltoid, 4-/5 Biceps, 4-/5 Triceps, 4-/5 Wrist Ext, 4-/5 Grip RLE: HF 4+/5, KE 4+/5, ADF 4+/5, APF 4+/5 LLE: HF 4/5, KE 4/5, ADF4/5, APF 4/5 Tinel's wrist and elbow and Phalen's test negative bilateral upper extremities Sensation intact light touch in all 4 extremities.  Slight increased tone in left hand Musculoskeletal:  No joint swelling or tenderness noted Using walker     Assessment & Plan:   1) Right PCA scattered infarct with thalamic HTN punctate left PCA infarct status post TNK   - Provided numbers to schedule follow-up cardiology  - Appears he will have completed PT and OT tomorrow.  - SLP consult placed for swallowing  2) Bilateral hand pain, unclear origin at this time  - Patient reports this started after her stroke  - Will start gabapentin  100 mg 3 times daily  - Consider EMG/nerve conduction study if does not improve or worsens  3) Hypertension  - Controlled  - Continue follow-up with PCP 4) History of CHF   - No signs of fluid overload noted today  5) Pulmonary embolism / DVT left popliteal vein   - Continue follow-up with PCP/cardiology  6) H/O prostate cancer/overactive bladder  - Patient has  Myrbetriq  with him today, continues to have frequent urination.  Recommend he continue to follow-up with urology   Had MBS completed by SLP on 09/07/23   Recommendations/Plan: Swallowing Evaluation Recommendations Swallowing Evaluation Recommendations Recommendations: PO diet PO Diet Recommendation: Regular; Thin liquids (Level 0) Liquid Administration via: Cup; Straw Medication Administration: Whole meds with puree Swallowing strategies  : Slow rate; Small bites/sips Postural changes: Position pt fully upright for meals; Stay upright 30-60 min after meals Oral care recommendations: Oral care BID (2x/day)

## 2023-08-10 ENCOUNTER — Ambulatory Visit

## 2023-08-10 DIAGNOSIS — I639 Cerebral infarction, unspecified: Secondary | ICD-10-CM

## 2023-08-10 LAB — CUP PACEART REMOTE DEVICE CHECK
Date Time Interrogation Session: 20250727235806
Implantable Pulse Generator Implant Date: 20250207

## 2023-08-16 ENCOUNTER — Ambulatory Visit: Payer: Self-pay | Admitting: Cardiology

## 2023-08-17 ENCOUNTER — Other Ambulatory Visit (HOSPITAL_COMMUNITY): Payer: Self-pay | Admitting: Occupational Therapy

## 2023-08-17 DIAGNOSIS — R1312 Dysphagia, oropharyngeal phase: Secondary | ICD-10-CM

## 2023-08-17 DIAGNOSIS — I639 Cerebral infarction, unspecified: Secondary | ICD-10-CM

## 2023-09-07 ENCOUNTER — Ambulatory Visit (HOSPITAL_COMMUNITY): Attending: Physical Medicine & Rehabilitation | Admitting: Speech Pathology

## 2023-09-07 ENCOUNTER — Other Ambulatory Visit: Payer: Self-pay

## 2023-09-07 ENCOUNTER — Encounter (HOSPITAL_COMMUNITY): Payer: Self-pay | Admitting: Speech Pathology

## 2023-09-07 ENCOUNTER — Ambulatory Visit (HOSPITAL_COMMUNITY)
Admission: RE | Admit: 2023-09-07 | Discharge: 2023-09-07 | Disposition: A | Discharge status: Home / Self Care | Source: Ambulatory Visit | Attending: Physical Medicine & Rehabilitation | Admitting: Physical Medicine & Rehabilitation

## 2023-09-07 DIAGNOSIS — R1312 Dysphagia, oropharyngeal phase: Secondary | ICD-10-CM | POA: Diagnosis present

## 2023-09-07 DIAGNOSIS — I639 Cerebral infarction, unspecified: Secondary | ICD-10-CM | POA: Insufficient documentation

## 2023-09-07 NOTE — Therapy (Signed)
 Modified Barium Swallow Study  Patient Details  Name: Edward Rodgers MRN: [id number] Date of Birth: 08-12-1950  Today's Date: 09/07/2023  HPI/PMH: HPI: Edward Rodgers is a 73 yo male who was referred for MBSS by Dr. Murray Collier due to Pt reports of difficulty swallowing thin liquids, saliva, and crumbs from solids. He had a stroke in January of 2025 and was hospitalized but did not present with significant dysphagia at that time. Pt was accompanied to the appointment by his wife.   Clinical Impression: Clinical Impression: Pt presents with mild pharyngeal dysphagia characterized by variable delay in swallow trigger, generally at the level of the valleculae, normal hyolaryngeal excursion, tongue base retraction, and epiglottic deflection. Pt with prominent cricopharyngeus and the pill briefly delayed just inferior to this and Pt aspirated a trace amount of thin when attempting to clear the pill (penetrated before the swallow and aspirated after the swallow) which elicited a cough, but not completely cleared. Pt also with penetration with straw sips thin x1. Esophageal sweep was unremarkable. Recommend regular textures and thin liquids via cup sips (avoid straws and take small sips) and PO medication whole in puree or with water , sit upright for all PO and remain upright for at least 30 minutes after. No further SLP services indicated at this time.   Factors that may increase risk of adverse event in presence of aspiration Noe & Lianne 2021): No data recorded  Recommendations/Plan: Swallowing Evaluation Recommendations Swallowing Evaluation Recommendations Recommendations: PO diet PO Diet Recommendation: Regular; Thin liquids (Level 0) Liquid Administration via: Cup; Straw Medication Administration: Whole meds with puree Swallowing strategies  : Slow rate; Small bites/sips Postural changes: Position pt fully upright for meals; Stay upright 30-60 min after meals Oral care recommendations:  Oral care BID (2x/day)    Treatment Plan Treatment Plan Treatment recommendations: No treatment recommended at this time Follow-up recommendations: No SLP follow up     Recommendations Recommendations for follow up therapy are one component of a multi-disciplinary discharge planning process, led by the attending physician.  Recommendations may be updated based on patient status, additional functional criteria and insurance authorization.  Assessment: Orofacial Exam: Orofacial Exam Oral Cavity: Oral Hygiene: WFL Oral Cavity - Dentition: Adequate natural dentition Orofacial Anatomy: WFL Oral Motor/Sensory Function: WFL    Anatomy:  Anatomy: Prominent cricopharyngeus   Boluses Administered: Boluses Administered Boluses Administered: Thin liquids (Level 0); Mildly thick liquids (Level 2, nectar thick); Puree; Solid     Oral Impairment Domain: Oral Impairment Domain Lip Closure: No labial escape Tongue control during bolus hold: Cohesive bolus between tongue to palatal seal Bolus preparation/mastication: Timely and efficient chewing and mashing Bolus transport/lingual motion: Brisk tongue motion Oral residue: Complete oral clearance Location of oral residue : N/A Initiation of pharyngeal swallow : Valleculae; Posterior laryngeal surface of the epiglottis     Pharyngeal Impairment Domain: Pharyngeal Impairment Domain Soft palate elevation: No bolus between soft palate (SP)/pharyngeal wall (PW) Laryngeal elevation: Complete superior movement of thyroid  cartilage with complete approximation of arytenoids to epiglottic petiole Anterior hyoid excursion: Complete anterior movement Epiglottic movement: Complete inversion Laryngeal vestibule closure: Incomplete, narrow column air/contrast in laryngeal vestibule Pharyngeal stripping wave : Present - complete Pharyngeal contraction (A/P view only): Complete Pharyngoesophageal segment opening: Partial distention/partial  duration, partial obstruction of flow Tongue base retraction: No contrast between tongue base and posterior pharyngeal wall (PPW) Pharyngeal residue: Complete pharyngeal clearance Location of pharyngeal residue: N/A    Esophageal Impairment Domain: Esophageal Impairment Domain Esophageal clearance upright position: Complete  clearance, esophageal coating   Pill: Pill Consistency administered: Thin liquids (Level 0) Thin liquids (Level 0): [facility]   Penetration/Aspiration Scale Score: Penetration/Aspiration Scale Score 1.  Material does not enter airway: Thin liquids (Level 0); Mildly thick liquids (Level 2, nectar thick); Puree; Solid; Pill 7.  Material enters airway, passes BELOW cords and not ejected out despite cough attempt by patient: Thin liquids (Level 0)   Compensatory Strategies: Compensatory Strategies Compensatory strategies: Yes (small sips) Straw: Ineffective     General Information: Caregiver present: Yes   Diet Prior to this Study: Regular; Thin liquids (Level 0)    Temperature : Normal    Respiratory Status: WFL    Supplemental O2: None (Room air)    History of Recent Intubation: No   Behavior/Cognition: Alert; Cooperative; Pleasant mood  Self-Feeding Abilities: Able to self-feed  Baseline vocal quality/speech: Normal  Volitional Cough: Able to elicit  Volitional Swallow: Able to elicit  Exam Limitations: No limitations  Pain: Pain Assessment Pain Assessment: No/denies pain    End of Session: Start Time:No data recorded Stop Time: No data recorded Time Calculation:No data recorded Charges: No data recorded SLP visit diagnosis: SLP Visit Diagnosis: Dysphagia, pharyngeal phase (R13.13)    Past Medical History:  Past Medical History:  Diagnosis Date   Cancer (HCC) 2002   prostate   CHF (congestive heart failure) (HCC)    Gout    Hypertension    Stroke (HCC)    Thyroid  disease    Past Surgical History:  Past Surgical History:   Procedure Laterality Date   COLONOSCOPY N/A 07/15/2017   Procedure: COLONOSCOPY;  Surgeon: Golda Claudis PENNER, MD;  Location: [facility];  Service: Endoscopy;  Laterality: N/A;  830   IR CT HEAD LTD  02/14/2023   IR PERCUTANEOUS ART THROMBECTOMY/INFUSION INTRACRANIAL INC DIAG ANGIO  02/14/2023   LOOP RECORDER INSERTION N/A 02/20/2023   Procedure: LOOP RECORDER INSERTION;  Surgeon: Fernande Elspeth BROCKS, MD;  Location: [facility] INVASIVE CV LAB;  Service: Cardiovascular;  Laterality: N/A;   POLYPECTOMY  07/15/2017   Procedure: POLYPECTOMY;  Surgeon: Golda Claudis PENNER, MD;  Location: [facility];  Service: Endoscopy;;  colon   PROSTATE SURGERY     RADIOLOGY WITH ANESTHESIA Left 02/14/2023   Procedure: IR WITH ANESTHESIA;  Surgeon: Radiologist, Medication, MD;  Location: [facility] OR;  Service: Radiology;  Laterality: Left;   RADIOLOGY WITH ANESTHESIA N/A 02/14/2023   Procedure: IR WITH ANESTHESIA;  Surgeon: Radiologist, Medication, MD;  Location: [facility] OR;  Service: Radiology;  Laterality: N/A;   Thank you,  Lamar Candy, CCC-SLP [phone]  Damion Kant 09/07/2023, 6:00 PM

## 2023-09-10 ENCOUNTER — Ambulatory Visit

## 2023-09-10 DIAGNOSIS — I639 Cerebral infarction, unspecified: Secondary | ICD-10-CM | POA: Diagnosis not present

## 2023-09-10 LAB — CUP PACEART REMOTE DEVICE CHECK
Date Time Interrogation Session: 20250827234638
Implantable Pulse Generator Implant Date: 20250207

## 2023-09-13 ENCOUNTER — Ambulatory Visit: Payer: Self-pay | Admitting: Cardiology

## 2023-09-23 NOTE — Progress Notes (Signed)
 [facility] Summary Report / Loop Recorder

## 2023-09-24 NOTE — Progress Notes (Signed)
 Remote Loop Recorder Transmission

## 2023-10-01 ENCOUNTER — Emergency Department (HOSPITAL_COMMUNITY)

## 2023-10-01 ENCOUNTER — Other Ambulatory Visit: Payer: Self-pay

## 2023-10-01 ENCOUNTER — Emergency Department (HOSPITAL_COMMUNITY)
Admission: EM | Admit: 2023-10-01 | Discharge: 2023-10-01 | Disposition: A | Discharge status: Home / Self Care | Attending: Emergency Medicine | Admitting: Emergency Medicine

## 2023-10-01 DIAGNOSIS — Z79899 Other long term (current) drug therapy: Secondary | ICD-10-CM | POA: Insufficient documentation

## 2023-10-01 DIAGNOSIS — I110 Hypertensive heart disease with heart failure: Secondary | ICD-10-CM | POA: Diagnosis not present

## 2023-10-01 DIAGNOSIS — I509 Heart failure, unspecified: Secondary | ICD-10-CM | POA: Insufficient documentation

## 2023-10-01 DIAGNOSIS — Z7901 Long term (current) use of anticoagulants: Secondary | ICD-10-CM | POA: Diagnosis not present

## 2023-10-01 DIAGNOSIS — R42 Dizziness and giddiness: Secondary | ICD-10-CM | POA: Diagnosis present

## 2023-10-01 DIAGNOSIS — Z8673 Personal history of transient ischemic attack (TIA), and cerebral infarction without residual deficits: Secondary | ICD-10-CM | POA: Diagnosis not present

## 2023-10-01 LAB — DIFFERENTIAL
Abs Immature Granulocytes: 0.01 K/uL (ref 0.00–0.07)
Basophils Absolute: 0.1 K/uL (ref 0.0–0.1)
Basophils Relative: 1 %
Eosinophils Absolute: 0.2 K/uL (ref 0.0–0.5)
Eosinophils Relative: 3 %
Immature Granulocytes: 0 %
Lymphocytes Relative: 25 %
Lymphs Abs: 1.2 K/uL (ref 0.7–4.0)
Monocytes Absolute: 0.4 K/uL (ref 0.1–1.0)
Monocytes Relative: 9 %
Neutro Abs: 2.9 K/uL (ref 1.7–7.7)
Neutrophils Relative %: 62 %

## 2023-10-01 LAB — PROTIME-INR
INR: 1 (ref 0.8–1.2)
Prothrombin Time: 14.1 s (ref 11.4–15.2)

## 2023-10-01 LAB — CBC
HCT: 37.2 % — ABNORMAL LOW (ref 39.0–52.0)
Hemoglobin: 11.2 g/dL — ABNORMAL LOW (ref 13.0–17.0)
MCH: 27.1 pg (ref 26.0–34.0)
MCHC: 30.1 g/dL (ref 30.0–36.0)
MCV: 89.9 fL (ref 80.0–100.0)
Platelets: 205 K/uL (ref 150–400)
RBC: 4.14 MIL/uL — ABNORMAL LOW (ref 4.22–5.81)
RDW: 13.8 % (ref 11.5–15.5)
WBC: 4.7 K/uL (ref 4.0–10.5)
nRBC: 0 % (ref 0.0–0.2)

## 2023-10-01 LAB — COMPREHENSIVE METABOLIC PANEL WITH GFR
ALT: 15 U/L (ref 0–44)
AST: 19 U/L (ref 15–41)
Albumin: 3.7 g/dL (ref 3.5–5.0)
Alkaline Phosphatase: 51 U/L (ref 38–126)
Anion gap: 12 (ref 5–15)
BUN: 21 mg/dL (ref 8–23)
CO2: 23 mmol/L (ref 22–32)
Calcium: 9.2 mg/dL (ref 8.9–10.3)
Chloride: 105 mmol/L (ref 98–111)
Creatinine, Ser: 0.99 mg/dL (ref 0.61–1.24)
GFR, Estimated: >60 mL/min
Glucose, Bld: 106 mg/dL — ABNORMAL HIGH (ref 70–99)
Potassium: 3.9 mmol/L (ref 3.5–5.1)
Sodium: 140 mmol/L (ref 135–145)
Total Bilirubin: 0.5 mg/dL (ref 0.0–1.2)
Total Protein: 7.1 g/dL (ref 6.5–8.1)

## 2023-10-01 LAB — URINALYSIS, ROUTINE W REFLEX MICROSCOPIC
Bilirubin Urine: NEGATIVE
Glucose, UA: NEGATIVE mg/dL
Hgb urine dipstick: NEGATIVE
Ketones, ur: NEGATIVE mg/dL
Leukocytes,Ua: NEGATIVE
Nitrite: NEGATIVE
Protein, ur: NEGATIVE mg/dL
Specific Gravity, Urine: 1.005 (ref 1.005–1.030)
pH: 7 (ref 5.0–8.0)

## 2023-10-01 LAB — I-STAT CHEM 8, ED
BUN: 23 mg/dL (ref 8–23)
Calcium, Ion: 1.15 mmol/L (ref 1.15–1.40)
Chloride: 107 mmol/L (ref 98–111)
Creatinine, Ser: 1 mg/dL (ref 0.61–1.24)
Glucose, Bld: 104 mg/dL — ABNORMAL HIGH (ref 70–99)
HCT: 37 % — ABNORMAL LOW (ref 39.0–52.0)
Hemoglobin: 12.6 g/dL — ABNORMAL LOW (ref 13.0–17.0)
Potassium: 3.8 mmol/L (ref 3.5–5.1)
Sodium: 141 mmol/L (ref 135–145)
TCO2: 22 mmol/L (ref 22–32)

## 2023-10-01 LAB — APTT: aPTT: 26 s (ref 24–36)

## 2023-10-01 LAB — CBG MONITORING, ED: Glucose-Capillary: 105 mg/dL — ABNORMAL HIGH (ref 70–99)

## 2023-10-01 MED ORDER — MECLIZINE HCL 25 MG PO TABS
25.0000 mg | ORAL_TABLET | Freq: Once | ORAL | Status: AC
  Administered 2023-10-01: 25 mg via ORAL
  Filled 2023-10-01: qty 1

## 2023-10-01 MED ORDER — ONDANSETRON HCL 4 MG/2ML IJ SOLN
4.0000 mg | Freq: Once | INTRAMUSCULAR | Status: AC
  Administered 2023-10-01: 4 mg via INTRAVENOUS
  Filled 2023-10-01: qty 2

## 2023-10-01 MED ORDER — MECLIZINE HCL 25 MG PO TABS: 25.0000 mg | ORAL_TABLET | Freq: Two times a day (BID) | ORAL | 0 refills | Status: AC

## 2023-10-01 MED ORDER — SODIUM CHLORIDE 0.9% FLUSH
3.0000 mL | Freq: Once | INTRAVENOUS | Status: AC
  Administered 2023-10-01: 3 mL via INTRAVENOUS

## 2023-10-01 NOTE — Discharge Instructions (Signed)
 Your laboratories alts are within normal limits today.  Please take your meclizine  in order to help with the dizziness that you are experiencing.  If experience any headache, worsening symptoms please return to emergency department.

## 2023-10-01 NOTE — ED Provider Notes (Signed)
 [place] EMERGENCY DEPARTMENT AT [facility] Provider Note   CSN: [id number] Arrival date & time: 10/01/23  0840     Patient presents with: Dizziness, Abdominal Pain, Night Sweats, Weakness, and Nausea   Edward Rodgers is a 73 y.o. male.   73 y.o male with a PMH of HTN, CHF, Stroke presents the ED via POV with a chief complaint of dizziness.  Patient reports his symptoms began yesterday.  [facility] as he was having increase in voiding.  He reports when he got home he laid in the bed, felt a spinning sensation that lasted throughout most of the night.  Woke up this morning and felt that the spinning sensation was still there, felt very dizzy.  Reports anytime he turned over in bed he felt the dizziness get worse.  He does report worsening symptoms along with feeling nauseated but did not have any episodes of vomiting.  He does have a prior history of vertigo, and states that this feel like severe vertigo.  He did not take any medications for improvement in his symptoms.  He denies any headache, focal weakness, fevers.  The history is provided by the patient and a relative.  Dizziness Associated symptoms: weakness   Associated symptoms: no shortness of breath   Abdominal Pain Associated symptoms: no chills, no fever and no shortness of breath   Weakness Associated symptoms: abdominal pain and dizziness   Associated symptoms: no fever and no shortness of breath        Prior to Admission medications   Medication Sig Start Date End Date Taking? Authorizing Provider  meclizine  (ANTIVERT ) 25 MG tablet Take 1 tablet (25 mg total) by mouth 2 (two) times daily for 5 days. 10/01/23 10/06/23 Yes Eartha Vonbehren, PA-C  alendronate (FOSAMAX) 70 MG tablet Take 70 mg by mouth once a week. Take with a full glass of water  on an empty stomach.    [provider]  allopurinol  (ZYLOPRIM ) 300 MG tablet Take 300 mg by mouth every  morning.    [provider]  amLODipine  (NORVASC ) 10 MG tablet Take 1 tablet (10 mg total) by mouth daily. 03/26/23   Setzer, Sandra J, PA-C  amoxicillin-clavulanate (AUGMENTIN) 875-125 MG tablet Take 1 tablet by mouth 2 (two) times daily.    [provider]  apixaban  (ELIQUIS ) 5 MG TABS tablet Take 1 tablet (5 mg total) by mouth 2 (two) times daily. 05/26/23     atorvastatin  (LIPITOR ) 80 MG tablet Take 1 tablet (80 mg total) by mouth daily. 03/26/23   Setzer, Sandra J, PA-C  fluticasone  (FLONASE ) 50 MCG/ACT nasal spray Place 1 spray into both nostrils daily. 03/27/23   Setzer, Sandra J, PA-C  gabapentin  (NEURONTIN ) 100 MG capsule Take 1 capsule (100 mg total) by mouth 3 (three) times daily. 07/27/23   [name], MD  latanoprost  (XALATAN ) 0.005 % ophthalmic solution Place 1 drop into the right eye at bedtime. 02/21/23   Toberman, Stevi W, NP  levothyroxine  (SYNTHROID ) 88 MCG tablet Take 1 tablet (88 mcg total) by mouth daily before breakfast. 03/26/23   Setzer, Sandra J, PA-C  lidocaine  (LIDODERM ) 5 % Place 1 patch onto the skin daily as needed. Patch should only stay on for 12 hours, must have 12+ hours off before replacing. 05/27/23     losartan  (COZAAR ) 50 MG tablet Take 1 tablet (50 mg total) by mouth daily. 04/27/23   [name], MD  melatonin 5 MG TABS Take 1 tablet (5 mg total) by mouth at bedtime. 05/26/23     metoprolol  succinate (TOPROL -XL) 100 MG 24 hr tablet Take 1 tablet (100 mg total) by mouth daily. Take with or immediately following a meal. 03/26/23   Setzer, Nena PARAS, PA-C  mirabegron  ER (MYRBETRIQ ) 50 MG TB24 tablet Take 50 mg by mouth daily. 05/06/23   [provider]  spironolactone  (ALDACTONE ) 25 MG tablet Take 0.5 tablets (12.5 mg total) by mouth daily. 03/27/23   Setzer, Nena PARAS, PA-C  venlafaxine  XR (EFFEXOR -XR) 150 MG 24 hr capsule Take 1 capsule (150 mg total) by mouth every evening. 03/26/23   Setzer, Sandra J, PA-C    Allergies: Patient has no  known allergies.    Review of Systems  Constitutional:  Negative for chills and fever.  Respiratory:  Negative for shortness of breath.   Gastrointestinal:  Positive for abdominal pain.  Neurological:  Positive for dizziness and weakness.  All other systems reviewed and are negative.   Updated Vital Signs BP (!) 163/77   Pulse 71   Temp 98.1 F (36.7 C) (Oral)   Resp 14   Ht 5' 9 (1.753 m)   Wt 87.1 kg   SpO2 99%   BMI 28.35 kg/m   Physical Exam Vitals and nursing note reviewed.  Constitutional:      Appearance: He is well-developed.  HENT:     Head: Normocephalic and atraumatic.  Cardiovascular:     Rate and Rhythm: Normal rate.  Pulmonary:     Effort: Pulmonary effort is normal.     Breath sounds: No wheezing.  Abdominal:     General: Abdomen is flat. Bowel sounds are normal.     Palpations: Abdomen is soft.     Tenderness: There is no abdominal tenderness.  Skin:    General: Skin is warm and dry.  Neurological:     Mental Status: He is alert and oriented to person, place, and time.     Comments: No facial asymmetry, no dysarthria. Moves upper extremities with slow movement of the left arm. Lower extremities with good strength, does report dragging of his left leg from prior stroke.     (all labs ordered are listed, but only abnormal results are displayed) Labs Reviewed  CBC - Abnormal; Notable for the following components:      Result Value   RBC 4.14 (*)    Hemoglobin 11.2 (*)    HCT 37.2 (*)    All other components within normal limits  COMPREHENSIVE METABOLIC PANEL WITH GFR - Abnormal; Notable for the following components:   Glucose, Bld 106 (*)    All other components within normal limits  URINALYSIS, ROUTINE W REFLEX MICROSCOPIC - Abnormal; Notable for the following components:   Color, Urine COLORLESS (*)    All other components within normal limits  I-STAT CHEM 8, ED - Abnormal; Notable for the following components:   Glucose, Bld 104 (*)     Hemoglobin 12.6 (*)    HCT 37.0 (*)    All other components within normal limits  CBG MONITORING, ED - Abnormal; Notable for the following components:   Glucose-Capillary 105 (*)    All other components within normal limits  DIFFERENTIAL  PROTIME-INR  APTT  ETHANOL    EKG: EKG Interpretation Date/Time:  Thursday October 01 2023 09:23:52 EDT Ventricular Rate:  66 PR Interval:  144 QRS Duration:  96 QT Interval:  406 QTC Calculation: 425 R Axis:  28  Text Interpretation: Normal sinus rhythm Left ventricular hypertrophy with repolarization abnormality ( Sokolow-Lyon ) Abnormal ECG When compared with ECG of 14-May-2023 10:37, No significant change since last tracing Confirmed by Randol Simmonds [phone]) on 10/01/2023 11:13:48 AM  Radiology: MR BRAIN WO CONTRAST Result Date: 10/01/2023 CLINICAL DATA:  Syncope/presyncope, cerebrovascular cause suspected. EXAM: MRI HEAD WITHOUT CONTRAST TECHNIQUE: Multiplanar, multiecho pulse sequences of the brain and surrounding structures were obtained without intravenous contrast. COMPARISON:  Head CT 10/01/2023 and MRI 02/15/2023 FINDINGS: The study is mildly motion degraded. Brain: There is no evidence of an acute infarct, midline shift, hydrocephalus, or extra-axial fluid collection. Chronic infarcts involving the mesial right temporal lobe, right occipital lobe, right thalamus, and right periatrial white matter have evolved from the prior MRI. A small amount of chronic blood products are noted at the chronic right thalamic infarct. Small chronic right cerebellar infarcts are unchanged. Small T2 hyperintensities elsewhere in the cerebral white matter bilaterally are unchanged and nonspecific but compatible with mild chronic small vessel ischemic disease. There is mild cerebral atrophy. A 2.5 cm anterior left parafalcine meningioma is similar to the prior MRI, with local mass effect and no evidence of edema. Vascular: Major intracranial vascular flow voids are  preserved. Skull and upper cervical spine: No suspicious marrow lesion. Asymmetrically advanced left-sided facet arthrosis in the included upper cervical spine. Sinuses/Orbits: Right cataract extraction. Paranasal sinuses and mastoid air cells are clear. Other: None. IMPRESSION: 1. No acute intracranial abnormality. 2. Chronic ischemia with multiple old infarcts as above. 3. Unchanged left parafalcine meningioma. Electronically Signed   By: Dasie Hamburg M.D.   On: 10/01/2023 13:09   DG Chest Portable 1 View Result Date: 10/01/2023 CLINICAL DATA:  Dizziness, weakness. EXAM: PORTABLE CHEST 1 VIEW COMPARISON:  March 09, 2023. FINDINGS: Stable cardiomediastinal silhouette. Minimal bibasilar subsegmental atelectasis or scarring is noted. Mild S-shaped scoliosis of thoracic spine is noted. IMPRESSION: Minimal bibasilar subsegmental atelectasis or scarring. Electronically Signed   By: Lynwood Edward Raddle M.D.   On: 10/01/2023 12:01   CT HEAD WO CONTRAST Result Date: 10/01/2023 CLINICAL DATA:  73 year old male with onset overnight of weakness, diaphoresis, dizziness. History of prior infarct, anterior para falcine meningioma. EXAM: CT HEAD WITHOUT CONTRAST TECHNIQUE: Contiguous axial images were obtained from the base of the skull through the vertex without intravenous contrast. RADIATION DOSE REDUCTION: This exam was performed according to the departmental dose-optimization program which includes automated exposure control, adjustment of the mA and/or kV according to patient size and/or use of iterative reconstruction technique. COMPARISON:  Brain MRI 02/15/2023, head CT 03/23/2023, and earlier. FINDINGS: Brain: Calcified anterior left para falcine meningioma is round, about 2.5 cm diameter and stable. Mild regional mass effect is stable. No associated cerebral edema by CT. Chronic infarct with patchy encephalomalacia right PCA territory (occipital pole, mesial right temporal lobe, right thalamus). Posterior right  corona radiata ischemia also on the previous MRI with expected evolution there as well. And small chronic right cerebellar infarcts. No midline shift, ventriculomegaly, intracranial hemorrhage or evidence of cortically based acute infarction. Vascular: Calcified atherosclerosis at the skull base. No suspicious intracranial vascular hyperdensity. Skull: Appears stable and intact. No acute osseous abnormality identified. Sinuses/Orbits: Visualized paranasal sinuses and mastoids are clear. Other: Chronic right vertex scalp soft tissue scarring. No acute orbit or scalp soft tissue finding. IMPRESSION: 1. No acute intracranial abnormality. 2. Stable non contrast CT appearance of chronic ischemic disease since March. 3. 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 Electronically Signed  By: VEAR Hurst M.D.   On: 10/01/2023 09:47     Procedures   Medications Ordered in the ED  sodium chloride  flush (NS) 0.9 % injection 3 mL (3 mLs Intravenous Given 10/01/23 1327)  meclizine  (ANTIVERT ) tablet 25 mg (25 mg Oral Given 10/01/23 1213)  ondansetron  (ZOFRAN ) injection 4 mg (4 mg Intravenous Given 10/01/23 1326)                                    Medical Decision Making Amount and/or Complexity of Data Reviewed Labs: ordered. Radiology: ordered.  Risk Prescription drug management.   This patient presents to the ED for concern of dizziness, this involves a number of treatment options, and is a complaint that carries with it a high risk of complications and morbidity.  The differential diagnosis includes CVA, vertigo versus infection.   Co morbidities: Discussed in HPI   Brief History:  See HPI  EMR reviewed including pt PMHx, past surgical history and past visits to ER.   See HPI for more details   Lab Tests:  I ordered and independently interpreted labs.  The pertinent results include:    CBC with no leukocytosis, hemoglobin is at his baseline.  CMP without any Electra  derangement to account for his symptoms.  Creatinine level is within normal limits.  LFTs are within normal limits.  CBG within normal limits.   Imaging Studies:  CT head showed: IMPRESSION:  1. No acute intracranial abnormality.  2. Stable non contrast CT appearance of chronic ischemic disease  since March.  3. 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    Cardiac Monitoring:  The patient was maintained on a cardiac monitor.  I personally viewed and interpreted the cardiac monitored which showed an underlying rhythm of: NSR EKG non-ischemic   Medicines ordered:  I ordered medication including Zofran , meclizine  for symptomatic treatment Reevaluation of the patient after these medicines showed that the patient resolved I have reviewed the patients home medicines and have made adjustments as needed  Reevaluation:  After the interventions noted above I re-evaluated patient and found that they have :improved Social Determinants of Health:  The patient's social determinants of health were a factor in the care of this patient   Problem List / ED Course:  Patient with underlying history of stroke here with onset of dizziness which began last night, worsening symptoms this morning.  Exacerbated with any type of movement.  Does have a prior history of vertigo, did not take any medication for improvement in symptoms.  Feels like he cannot ambulate without getting very dizzy.  Does have a loop recorder in place which will interrogate. Blood work is unremarkable on today's visit, no electrolyte derangement, no leukocytosis, creatinine levels within normal limits.  CT head obtained during triage process, this does not show any acute stroke at this time.  His exam is very reassuring. He does have residual effects such as dragging of the left leg which she reports is normal for him.  He is not describing any dysarthria, or facial asymmetry during these episodes.  He is denying  any headache has not had any fever lower suspicion for infection. X-ray of the chest without any acute infection noted. MRI of the brain was also obtained as patient does have a prior history of stroke, no acute findings on today's visit.  After receiving meclizine  he has been able to ambulate  with a steady gait.  He does report resolution and improvement of his symptoms at this time. [employer] loop recorder has been interrogated, reports shows that there is no acute events noted.  Discussed with patient.  Hemodynamically stable for discharge.  Dispostion:  After consideration of the diagnostic results and the patients response to treatment, I feel that the patent would benefit from continued treatment with meclizine , return for worsening symptoms    Portions of this note were generated with Dragon dictation software. Dictation errors may occur despite best attempts at proofreading.   Final diagnoses:  Dizziness    ED Discharge Orders          Ordered    meclizine  (ANTIVERT ) 25 MG tablet  2 times daily        10/01/23 1458               Virginia Francisco, PA-C 10/01/23 1507    Randol Simmonds, MD 10/02/23 [phone]

## 2023-10-01 NOTE — ED Triage Notes (Signed)
 Pt. Stated, Last night I got to feeling a lot weak, real sweaty, and dizzy and feeling the same this morning

## 2023-10-01 NOTE — ED Notes (Signed)
 No complaints of dizziness reported during ambulating.

## 2023-10-01 NOTE — ED Notes (Signed)
 CCMD called.

## 2023-10-01 NOTE — ED Notes (Signed)
 Patient ambulated in hallway with walker well.

## 2023-10-12 ENCOUNTER — Ambulatory Visit (INDEPENDENT_AMBULATORY_CARE_PROVIDER_SITE_OTHER)

## 2023-10-12 DIAGNOSIS — I639 Cerebral infarction, unspecified: Secondary | ICD-10-CM | POA: Diagnosis not present

## 2023-10-12 LAB — CUP PACEART REMOTE DEVICE CHECK
Date Time Interrogation Session: 20250928234958
Implantable Pulse Generator Implant Date: 20250207

## 2023-10-14 NOTE — Progress Notes (Signed)
 Remote Loop Recorder Transmission

## 2023-10-18 ENCOUNTER — Ambulatory Visit: Payer: Self-pay | Admitting: Cardiology

## 2023-10-29 ENCOUNTER — Encounter: Admitting: Physical Medicine & Rehabilitation

## 2023-11-09 ENCOUNTER — Other Ambulatory Visit (HOSPITAL_COMMUNITY): Payer: Self-pay

## 2023-11-12 ENCOUNTER — Ambulatory Visit (INDEPENDENT_AMBULATORY_CARE_PROVIDER_SITE_OTHER)

## 2023-11-12 DIAGNOSIS — I639 Cerebral infarction, unspecified: Secondary | ICD-10-CM | POA: Diagnosis not present

## 2023-11-12 LAB — CUP PACEART REMOTE DEVICE CHECK
Date Time Interrogation Session: 20251029234945
Implantable Pulse Generator Implant Date: 20250207

## 2023-11-13 ENCOUNTER — Other Ambulatory Visit (HOSPITAL_COMMUNITY): Payer: Self-pay

## 2023-11-15 ENCOUNTER — Ambulatory Visit: Payer: Self-pay | Admitting: Cardiology

## 2023-11-18 NOTE — Progress Notes (Signed)
 Remote Loop Recorder Transmission

## 2023-11-23 ENCOUNTER — Encounter: Payer: Self-pay | Admitting: Physical Medicine & Rehabilitation

## 2023-11-23 ENCOUNTER — Encounter: Attending: Physical Medicine & Rehabilitation | Admitting: Physical Medicine & Rehabilitation

## 2023-11-23 VITALS — BP 121/82 | HR 64 | Ht 69.0 in | Wt 189.0 lb

## 2023-11-23 DIAGNOSIS — N3281 Overactive bladder: Secondary | ICD-10-CM | POA: Diagnosis not present

## 2023-11-23 DIAGNOSIS — M792 Neuralgia and neuritis, unspecified: Secondary | ICD-10-CM | POA: Diagnosis present

## 2023-11-23 DIAGNOSIS — I63531 Cerebral infarction due to unspecified occlusion or stenosis of right posterior cerebral artery: Secondary | ICD-10-CM | POA: Insufficient documentation

## 2023-11-23 MED ORDER — GABAPENTIN 100 MG PO CAPS: 200.0000 mg | ORAL_CAPSULE | Freq: Three times a day (TID) | ORAL | 2 refills | Status: AC

## 2023-11-23 NOTE — Progress Notes (Signed)
 Subjective:    Patient ID: Edward Rodgers, male    DOB: [birth year], 73 y.o.   MRN: [id number]  HPI  [name] is here with his partner here for follow-up after completing treatment at [facility].  [name] reports he has been doing overall well.  He still working with physical and Occupational Therapy, continues to require some help from his wife for ADLs.  Pain is overall well-controlled.  He is out of several of his medications and would like a refill.  He has a follow-up scheduled with his PCP next month but will run out of medications before then.  His blood pressure has been well-controlled.  Continues to use oxybutynin  to help reduce incontinence.  She has not followed up yet with cardiology or neurology.  Interval history 07/27/23 Patient reports he was [facility] and had several medications continued.  Reports he would like to follow-up with cardiology regarding cardiac monitor.  I gave him the number to call cardiology.  Patient reports he still working with PT and OT, member of office staff did call his home health agency and appears therapy sessions are completed tomorrow.  He has not had speech therapy yet, reports continued difficulty with swallowing.  Has also been having some burning/tingling pain in his bilateral hands since his prior stroke.  Pain is constant and worsened with activities.  Interval history 11/23/23 Following recommendations from speech therapy in August to take small bites and eat slowly.   Reports fair mobility overall. Walking sometimes with cane other times with walker. Lacks confidence to walk independently. Denies any recent falls.  Primary concern is bilateral hand symptoms. Reports constant feeling of swelling and coldness in both hands, although they do not appear swollen on inspection. Also describes numbness and stiffness. Reports symptoms started around the time of a COVID-19 infection and worsened significantly after the stroke.  Notes a burning sensation similar to hands warming up after being in the snow. The numbness affects all five digits bilaterally but is most prominent in the three middle fingers, particularly on the palmar surface. Hand function is affected, with reduced grip strength.  Medication Review: - Gabapentin : Prescribed at last visit for hand pain. Reports it helps some or a little bit,.Reports no significant side effects such as drowsiness.   Pain Inventory Average Pain 8 Pain Right Now 3  My pain is intermittent sharp, tingling, aching.  Pain is worse night.  Sleep is fair due to pain  Pain is worth with sitting  Pain improves with medication.  Pain relief from medication good.  LOCATION OF PAIN  Left shoulder, bilateral hands, neck, left leg pain  BOWEL Number of stools per week: 8-9 Oral laxative use No  Type of laxative . Enema or suppository use No  History of colostomy No  Incontinent No   BLADDER Pads In and out cath, frequency. . Condom catheter (at night)    Mobility walk with assistance use a walker how many minutes can you walk? 2-3 ability to climb steps?  no do you drive?  no      No family history on file. Social History   Socioeconomic History   Marital status: Married    Spouse name: Not on file   Number of children: Not on file   Years of education: Not on file   Highest education level: Not on file  Occupational History   Not on file  Tobacco Use   Smoking status: Former  Smokeless tobacco: Never  Vaping Use   Vaping status: Never Used  Substance and Sexual Activity   Alcohol use: No   Drug use: No   Sexual activity: Not on file  Other Topics Concern   Not on file  Social History Narrative   Not on file   Social Drivers of Health   Financial Resource Strain: Not on file  Food Insecurity: Patient Declined (02/15/2023)   Hunger Vital Sign    Worried About Running Out of Food in the Last Year: Patient declined    Ran Out of  Food in the Last Year: Patient declined  Transportation Needs: Patient Unable To Answer (02/15/2023)   PRAPARE - Administrator, Civil Service (Medical): Patient unable to answer    Lack of Transportation (Non-Medical): Patient unable to answer  Physical Activity: Not on file  Stress: Not on file  Social Connections: Patient Unable To Answer (02/15/2023)   Social Connection and Isolation Panel    Frequency of Communication with Friends and Family: Patient unable to answer    Frequency of Social Gatherings with Friends and Family: Patient unable to answer    Attends Religious Services: Patient unable to answer    Active Member of Clubs or Organizations: Patient unable to answer    Attends Banker Meetings: Patient unable to answer    Marital Status: Patient unable to answer   Past Surgical History:  Procedure Laterality Date   COLONOSCOPY N/A 07/15/2017   Procedure: COLONOSCOPY;  Surgeon: Golda Claudis PENNER, MD;  [facility];  Service: Endoscopy;  Laterality: N/A;  830   IR CT HEAD LTD  02/14/2023   IR PERCUTANEOUS ART THROMBECTOMY/INFUSION INTRACRANIAL INC DIAG ANGIO  02/14/2023   LOOP RECORDER INSERTION N/A 02/20/2023   Procedure: LOOP RECORDER INSERTION;  Surgeon: Fernande Elspeth BROCKS, MD;  Location: [facility] INVASIVE CV LAB;  Service: Cardiovascular;  Laterality: N/A;   POLYPECTOMY  07/15/2017   Procedure: POLYPECTOMY;  Surgeon: Golda Claudis PENNER, MD;  [facility];  Service: Endoscopy;;  colon   PROSTATE SURGERY     RADIOLOGY WITH ANESTHESIA Left 02/14/2023   Procedure: IR WITH ANESTHESIA;  Surgeon: Radiologist, Medication, MD;  Location: [facility];  Service: Radiology;  Laterality: Left;   RADIOLOGY WITH ANESTHESIA N/A 02/14/2023   Procedure: IR WITH ANESTHESIA;  Surgeon: Radiologist, Medication, MD;  Location: [facility] OR;  Service: Radiology;  Laterality: N/A;   Past Medical History:  Diagnosis Date   Cancer (HCC) 2002   prostate   CHF (congestive heart failure) (HCC)     Gout    Hypertension    Stroke (HCC)    Thyroid  disease    There were no vitals taken for this visit.  Opioid Risk Score:   Fall Risk Score:  `1  Depression screen [facility] 2/9     07/27/2023    1:03 PM  Depression screen PHQ 2/9  Decreased Interest 1  Down, Depressed, Hopeless 1  PHQ - 2 Score 2  Altered sleeping 0  Tired, decreased energy 0  Change in appetite 0  Feeling bad or failure about yourself  0  Trouble concentrating 0  Moving slowly or fidgety/restless 0  Suicidal thoughts 0  PHQ-9 Score 2   Difficult doing work/chores Somewhat difficult     Data saved with a previous flowsheet row definition     Review of Systems  Cardiovascular:  Positive for leg swelling.       Swelling in both lower legs &  feet  Genitourinary:  Positive for frequency and urgency.  Musculoskeletal:  Positive for back pain and neck pain.       Pain in both hands  Neurological:  Positive for weakness and numbness.  All other systems reviewed and are negative.      Objective:   Physical Exam    10/01/2023    2:45 PM 10/01/2023    2:15 PM 10/01/2023    1:27 PM  Vitals with BMI  Systolic 163 148 834  Diastolic 77 90 93  Pulse 71 62 61     Gen: no distress, normal appearing HEENT: oral mucosa pink and moist, NCAT Chest: normal effort, normal rate of breathing Abd: soft, non-distended Ext: no edema noted Psych: pleasant, normal affect Skin: intact Neuro: Alert and awake, follows commands, cranial nerves II through XII grossly intact, mildly delayed responses, memory deficits noted RUE: 4+/5 Deltoid, 4+/5 Biceps, 4+/5 Triceps, 4+/5 Wrist Ext, 4+/5 Grip LUE: 3/5 Deltoid, 4-/5 Biceps, 4-/5 Triceps, 4-/5 Wrist Ext, 4-/5 Grip RLE: HF 4+/5, KE 4+/5, ADF 4+/5, APF 4+/5 LLE: HF 4/5, KE 4/5, ADF4/5, APF 4/5 Tinel's wrist and elbow and Phalen's at wrist test negative bilateral upper extremities again today Sensation intact light touch in all 4 extremities. 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 -Continue to follow SLP recommendations  2) Bilateral hand pain, neuropathic quality  - Will increase Gabapentin  to two capsules three times daily. Counselled that if tolerated for two weeks, dose can be increased to three capsules three times daily to assess for improved efficacy. - Patient agrees to an EMG/nerve conduction study of the upper extremities to further evaluate the nerve function and help clarify the diagnosis.  3) Hypertension  - Continue follow-up with PCP  4) History of CHF   - No signs of fluid overload noted today  5) Pulmonary embolism / DVT left popliteal vein   - Continue follow-up with PCP/cardiology  6) H/O prostate cancer/overactive bladder  -Recommended continued f/u with urology   Had MBS completed by SLP on 09/07/23   Recommendations/Plan: Swallowing Evaluation Recommendations Swallowing Evaluation Recommendations Recommendations: PO diet PO Diet Recommendation: Regular; Thin liquids (Level 0) Liquid Administration via: Cup; Straw Medication Administration: Whole meds with puree Swallowing strategies  : Slow rate; Small bites/sips Postural changes: Position pt fully upright for meals; Stay upright 30-60 min after meals Oral care recommendations: Oral care BID (2x/day)

## 2023-12-02 NOTE — Therapy (Signed)
 OUTPATIENT PHYSICAL THERAPY LOWER EXTREMITY EVALUATION   Patient Name: Edward Rodgers MRN: [id number] DOB:11-26-1950, 73 y.o., male Today's Date: 12/03/2023  END OF SESSION:  PT End of Session - 12/03/23 1201     Visit Number 1    Number of Visits 12    Date for Recertification  01/14/24    Authorization Type [employer] Medicare    Authorization Time Period please check auth    PT Start Time 1125   late check in   PT Stop Time 1200    PT Time Calculation (min) 35 min          Past Medical History:  Diagnosis Date   Cancer (HCC) 2002   prostate   CHF (congestive heart failure) (HCC)    Gout    Hypertension    Stroke (HCC)    Thyroid  disease    Past Surgical History:  Procedure Laterality Date   COLONOSCOPY N/A 07/15/2017   Procedure: COLONOSCOPY;  Surgeon: Golda Claudis PENNER, MD;  [facility];  Service: Endoscopy;  Laterality: N/A;  830   IR CT HEAD LTD  02/14/2023   IR PERCUTANEOUS ART THROMBECTOMY/INFUSION INTRACRANIAL INC DIAG ANGIO  02/14/2023   LOOP RECORDER INSERTION N/A 02/20/2023   Procedure: LOOP RECORDER INSERTION;  Surgeon: Fernande Elspeth BROCKS, MD;  Location: [facility] INVASIVE CV LAB;  Service: Cardiovascular;  Laterality: N/A;   POLYPECTOMY  07/15/2017   Procedure: POLYPECTOMY;  Surgeon: Golda Claudis PENNER, MD;  [facility];  Service: Endoscopy;;  colon   PROSTATE SURGERY     RADIOLOGY WITH ANESTHESIA Left 02/14/2023   Procedure: IR WITH ANESTHESIA;  Surgeon: Radiologist, Medication, MD;  Location: [facility];  Service: Radiology;  Laterality: Left;   RADIOLOGY WITH ANESTHESIA N/A 02/14/2023   Procedure: IR WITH ANESTHESIA;  Surgeon: Radiologist, Medication, MD;  Location: [facility];  Service: Radiology;  Laterality: N/A;   Patient Active Problem List   Diagnosis Date Noted   Cognitive change 03/17/2023   Hyponatremia 03/13/2023   Pulmonary embolus (HCC) 03/05/2023   Acute deep vein thrombosis (DVT) of popliteal vein of left lower extremity (HCC) 03/05/2023   Chronic  heart failure with mildly reduced ejection fraction (HFmrEF, 41-49%) (HCC) 03/05/2023   AKI (acute kidney injury) 03/05/2023   Precordial pain 03/03/2023   Shortness of breath 03/03/2023   Benign hypertension 03/03/2023   Heart failure with improved ejection fraction (HFimpEF) (HCC) 03/03/2023   Coping style affecting medical condition 02/27/2023   Acute right PCA stroke (HCC) 02/21/2023   Acute ischemic right PCA stroke (HCC) 02/14/2023   Posterior cerebral artery embolism, right 02/14/2023   Special screening for malignant neoplasms, colon 04/09/2017   Dizziness 03/29/2013   Cerebellar stroke, acute (HCC) 03/29/2013   CHF (congestive heart failure) (HCC) 03/28/2013   Vertigo 03/28/2013    PCP: Shona Norleen PEDLAR, MD  REFERRING PROVIDER: [name], MD  REFERRING DIAG: [phone] (ICD-10-CM) - Acute right PCA stroke (HCC)  THERAPY DIAG:  Left-sided weakness  Difficulty in walking, not elsewhere classified  Other abnormalities of gait and mobility  Rationale for Evaluation and Treatment: Rehabilitation  ONSET DATE: 02/14/2023  SUBJECTIVE:   SUBJECTIVE STATEMENT: 02/14/2023; woke up could not move; stroke; [place] and then [facility]; then had home health and now referred to outpatient; he has an AFO but not wearing; states it does not fit; asked him to bring in next visit.  Had a WC but sent that back as he was not using it.  Steps most challenging thing  for him; no handrails for basement steps. States he is supposed to be having a surgery on his neck?  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   Washer and dryer in basement; wood stove  PERTINENT HISTORY: CHF, HTN, gout, stroke PAIN:  Are you having pain? Yes: NPRS scale: 7/10 Pain location: left upper trap and shoulder Pain description: sharp Aggravating factors: turning  head to left Relieving factors: lidocaine  patch  PRECAUTIONS: Fall  RED FLAGS: None   WEIGHT BEARING RESTRICTIONS: No  FALLS:  Has patient fallen in last 6 months? Yes. Number of falls 2; at hospital slid out of WC  LIVING ENVIRONMENT: Lives with: lives with their spouse Lives in: House/apartment Stairs: Yes: Internal: 10 steps; none Has following equipment at home: Vannie - 2 wheeled, Grab bars, and Ramped entry  OCCUPATION: retired neurosurgeon  PLOF: Independent with household mobility with device and wife assists with buttons and sometimes with bathing  PATIENT GOALS: to walk like I used to; do steps better  NEXT MD VISIT: next month  OBJECTIVE:  Note: Objective measures were completed at Evaluation unless otherwise noted.  DIAGNOSTIC FINDINGS: I63.531 (ICD-10-CM) - Acute right PCA stroke (HCC)  PATIENT SURVEYS:  LEFS  Extreme difficulty/unable (0), Quite a bit of difficulty (1), Moderate difficulty (2), Little difficulty (3), No difficulty (4) Survey date:    Any of your usual work, housework or school activities   2. Usual hobbies, recreational or sporting activities   3. Getting into/out of the bath   4. Walking between rooms   5. Putting on socks/shoes   6. Squatting    7. Lifting an object, like a bag of groceries from the floor   8. Performing light activities around your home   9. Performing heavy activities around your home   10. Getting into/out of a car   11. Walking 2 blocks   12. Walking 1 mile   13. Going up/down 10 stairs (1 flight)   14. Standing for 1 hour   15.  sitting for 1 hour   16. Running on even ground   17. Running on uneven ground   18. Making sharp turns while running fast   19. Hopping    20. Rolling over in bed   Score total:  21/80; 26.3%     COGNITION: Overall cognitive status: Within functional limits for tasks assessed     SENSATION:   EDEMA:  None noted  MUSCLE LENGTH: Hamstrings:   POSTURE: flexed trunk    PALPATION:   LOWER EXTREMITY ROM:  Active ROM Right eval Left eval  Hip flexion    Hip extension    Hip abduction    Hip adduction    Hip internal rotation    Hip external rotation    Knee flexion    Knee extension    Ankle dorsiflexion    Ankle plantarflexion    Ankle inversion    Ankle eversion     (Blank rows = not tested)  LOWER EXTREMITY MMT:  MMT Right eval Left eval  Hip flexion 4+ 4  Hip extension    Hip abduction    Hip adduction    Hip internal rotation    Hip external rotation    Knee flexion    Knee extension 5 4+  Ankle dorsiflexion 5  4  Ankle plantarflexion    Ankle inversion    Ankle eversion     (Blank rows = not tested)   FUNCTIONAL TESTS:  5 times sit to stand: 1.02.43 sec using hands to push up to standing 2 minute walk test: 125 ft with rollator  GAIT: Distance walked: 125 ft Assistive device utilized: Environmental Consultant - 4 wheeled Level of assistance: Modified independence Comments: slight toe drop left; decreased gait speed                                                                                                                                TREATMENT DATE: 12/03/2023 physical therapy evaluation and HEP instruction    PATIENT EDUCATION:  Education details: Patient educated on exam findings, POC, scope of PT, HEP, and what to expect next visit. Person educated: Patient Education method: Explanation, Demonstration, and Handouts Education comprehension: verbalized understanding, returned demonstration, verbal cues required, and tactile cues required  HOME EXERCISE PROGRAM: Access Code: [email or web address] URL: [email or web address] Date: 12/03/2023 Prepared by: [facility]  Exercises - Sit to Stand with Armchair  - 2 x daily - 7 x weekly - 1 sets - 5 reps - Standing Ankle Plantar Flexion Dorsiflexion with Counter Support  - 2 x daily - 7 x weekly - 2 sets - 10 reps  ASSESSMENT:  CLINICAL IMPRESSION: Patient is a 73 y.o.  male who was seen today for physical therapy evaluation and treatment for I63.531 (ICD-10-CM) - Acute right PCA stroke (HCC). Patient demonstrates decreased strength, balance deficits and gait abnormalities which are negatively impacting patient ability to perform ADLs and functional mobility tasks. Patient will benefit from skilled physical therapy services to address these deficits to improve level of function with ADLs, functional mobility tasks, and reduce risk for falls.    OBJECTIVE IMPAIRMENTS: Abnormal gait, increased fascial restrictions, impaired perceived functional ability, and pain.   ACTIVITY LIMITATIONS: carrying, lifting, bending, standing, squatting, stairs, and locomotion level  PARTICIPATION LIMITATIONS: meal prep, cleaning, laundry, driving, shopping, and occupation  REHAB POTENTIAL: Good  CLINICAL DECISION MAKING: Evolving/moderate complexity  EVALUATION COMPLEXITY: Moderate   GOALS: Goals reviewed with patient? No  SHORT TERM GOALS: Target date: 12/24/2023 patient will be independent with initial HEP and compliant with HEP 3-4 times a week   Baseline: Goal status: INITIAL  2.  Patient will report 30% improvement overall  Baseline:  Goal status: INITIAL   LONG TERM GOALS: Target date: 01/14/2024  Patient will be independent in self management strategies to improve quality of life and functional outcomes.  Baseline:  Goal status: INITIAL  2.  Patient will report 50% improvement overall  Baseline:  Goal status: INITIAL  3.  Patient will improve LEFS score by 9 points to demonstrate improved perceived function  Baseline: 21/80 Goal status: INITIAL  4.  Patient will increase distance on 2 MWT to 225 ft or more with LRAD to demonstrate improved speed and efficiency with household and  community ambulation  Baseline: 125 with rollator Goal status: INITIAL  5.  Patient will improve 5 times sit to stand score to 30 sec or less to demonstrate improved  functional mobility and increased leg strength.  Baseline: 1 min and 2.45 sec Goal status: INITIAL   PLAN:  PT FREQUENCY: 1-2x/week  PT DURATION: 6 weeks  PLANNED INTERVENTIONS: [id number]- PT Re-evaluation, 97110-Therapeutic exercises, 97530- Therapeutic activity, 97112- Neuromuscular re-education, 97535- Self Care, [id number]- Manual therapy, [id number]- Gait training, [phone]- Orthotic Fit/training, [phone]- Canalith repositioning, [id number]- Aquatic Therapy, [phone]- Splinting, [phone]- Wound care (first 20 sq cm), 97598- Wound care (each additional 20 sq cm)Patient/Family education, Balance training, Stair training, Taping, Dry Needling, Joint mobilization, Joint manipulation, Spinal manipulation, Spinal mobilization, Scar mobilization, and DME instructions.   PLAN FOR NEXT SESSION: Review HEP and goals; states he has a referral for OT and ST as well; will bring next visit; gait and balance training, steps; lower extremity strengthening.  Please check SLS balance next visit   12:03 PM, 12/03/23 Abdon Petrosky Small Italo Banton MPT [facility] [place] [phone]  [facility] Medicare Auth Request Information Treatment Start Date: 12/03/2023  Date of referral: 11/23/2023 Referring provider: [name], MD Referring diagnosis (ICD 10)? P36.468 (ICD-10-CM) - Acute right PCA stroke (HCC) Treatment diagnosis (ICD 10)? (if different than referring diagnosis) R 53.1, R26.2, R26.89  What was this (referring dx) caused by? Other: stroke  Lysle of Condition: Chronic (continuous duration > 3 months)   Laterality: Lt  Current Functional Measure Score: LEFS 21/80  Objective measurements identify impairments when they are compared to normal values, the uninvolved extremity, and prior level of function.  [x]  Yes  []  No  Objective assessment of functional ability: Moderate functional limitations   Briefly describe symptoms: stoke with left side weakness  How did symptoms start: Feb 2025  Average pain  intensity:  Last 24 hours: 0-7/10  Past week: 0-7/10  How often does the pt experience symptoms? Constantly  How much have the symptoms interfered with usual daily activities? Moderately  How has condition changed since care began at this facility? A little better  In general, how is the patients overall health? Good   BACK PAIN (STarT Back Screening Tool) No

## 2023-12-03 ENCOUNTER — Other Ambulatory Visit: Payer: Self-pay

## 2023-12-03 ENCOUNTER — Ambulatory Visit (HOSPITAL_COMMUNITY): Attending: Internal Medicine

## 2023-12-03 DIAGNOSIS — R531 Weakness: Secondary | ICD-10-CM | POA: Diagnosis present

## 2023-12-03 DIAGNOSIS — R262 Difficulty in walking, not elsewhere classified: Secondary | ICD-10-CM | POA: Diagnosis present

## 2023-12-03 DIAGNOSIS — I63531 Cerebral infarction due to unspecified occlusion or stenosis of right posterior cerebral artery: Secondary | ICD-10-CM | POA: Diagnosis not present

## 2023-12-03 DIAGNOSIS — R2689 Other abnormalities of gait and mobility: Secondary | ICD-10-CM | POA: Diagnosis present

## 2023-12-07 ENCOUNTER — Ambulatory Visit (HOSPITAL_COMMUNITY)

## 2023-12-07 DIAGNOSIS — R2689 Other abnormalities of gait and mobility: Secondary | ICD-10-CM

## 2023-12-07 DIAGNOSIS — R531 Weakness: Secondary | ICD-10-CM

## 2023-12-07 DIAGNOSIS — R262 Difficulty in walking, not elsewhere classified: Secondary | ICD-10-CM

## 2023-12-07 NOTE — Therapy (Signed)
 OUTPATIENT PHYSICAL THERAPY LOWER EXTREMITY EVALUATION   Patient Name: Edward Rodgers MRN: [id number] DOB:October 13, 1950, 73 y.o., male Today's Date: 12/07/2023  END OF SESSION:  PT End of Session - 12/07/23 0903     Visit Number 2    Number of Visits 12    Date for Recertification  01/14/24    Authorization Type [facility] Medicare    Authorization Time Period please check auth    PT Start Time 0903    PT Stop Time 0943    PT Time Calculation (min) 40 min    Activity Tolerance Patient tolerated treatment well    Behavior During Therapy [facility] for tasks assessed/performed          Past Medical History:  Diagnosis Date   Cancer (HCC) 2002   prostate   CHF (congestive heart failure) (HCC)    Gout    Hypertension    Stroke (HCC)    Thyroid  disease    Past Surgical History:  Procedure Laterality Date   COLONOSCOPY N/A 07/15/2017   Procedure: COLONOSCOPY;  Surgeon: Golda Claudis PENNER, MD;  Location: [facility];  Service: Endoscopy;  Laterality: N/A;  830   IR CT HEAD LTD  02/14/2023   IR PERCUTANEOUS ART THROMBECTOMY/INFUSION INTRACRANIAL INC DIAG ANGIO  02/14/2023   LOOP RECORDER INSERTION N/A 02/20/2023   Procedure: LOOP RECORDER INSERTION;  Surgeon: Fernande Elspeth BROCKS, MD;  Location: [facility] INVASIVE CV LAB;  Service: Cardiovascular;  Laterality: N/A;   POLYPECTOMY  07/15/2017   Procedure: POLYPECTOMY;  Surgeon: Golda Claudis PENNER, MD;  Location: [facility];  Service: Endoscopy;;  colon   PROSTATE SURGERY     RADIOLOGY WITH ANESTHESIA Left 02/14/2023   Procedure: IR WITH ANESTHESIA;  Surgeon: Radiologist, Medication, MD;  Location: [facility] OR;  Service: Radiology;  Laterality: Left;   RADIOLOGY WITH ANESTHESIA N/A 02/14/2023   Procedure: IR WITH ANESTHESIA;  Surgeon: Radiologist, Medication, MD;  Location: [facility] OR;  Service: Radiology;  Laterality: N/A;   Patient Active Problem List   Diagnosis Date Noted   Cognitive change 03/17/2023   Hyponatremia 03/13/2023   Pulmonary embolus (HCC) 03/05/2023    Acute deep vein thrombosis (DVT) of popliteal vein of left lower extremity (HCC) 03/05/2023   Chronic heart failure with mildly reduced ejection fraction (HFmrEF, 41-49%) (HCC) 03/05/2023   AKI (acute kidney injury) 03/05/2023   Precordial pain 03/03/2023   Shortness of breath 03/03/2023   Benign hypertension 03/03/2023   Heart failure with improved ejection fraction (HFimpEF) (HCC) 03/03/2023   Coping style affecting medical condition 02/27/2023   Acute right PCA stroke (HCC) 02/21/2023   Acute ischemic right PCA stroke (HCC) 02/14/2023   Posterior cerebral artery embolism, right 02/14/2023   Special screening for malignant neoplasms, colon 04/09/2017   Dizziness 03/29/2013   Cerebellar stroke, acute (HCC) 03/29/2013   CHF (congestive heart failure) (HCC) 03/28/2013   Vertigo 03/28/2013    PCP: Shona Norleen PEDLAR, MD  REFERRING PROVIDER: [name], MD  REFERRING DIAG: [phone] (ICD-10-CM) - Acute right PCA stroke (HCC)  THERAPY DIAG:  Left-sided weakness  Difficulty in walking, not elsewhere classified  Other abnormalities of gait and mobility  Rationale for Evaluation and Treatment: Rehabilitation  ONSET DATE: 02/14/2023  SUBJECTIVE:   SUBJECTIVE STATEMENT: Forgot to bring my AFO this morning; reports no new pain or falls.    Eval:02/14/2023; woke up could not move; stroke; [name] and then CIR; then had home health and now referred to outpatient; he has an AFO but not wearing;  states it does not fit; asked him to bring in next visit.  Had a WC but sent that back as he was not using it.  Steps most challenging thing for him; no handrails for basement steps. States he is supposed to be having a surgery on his neck?  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   Washer and dryer in basement; wood stove  PERTINENT  HISTORY: CHF, HTN, gout, stroke PAIN:  Are you having pain? Yes: NPRS scale: 7/10 Pain location: left upper trap and shoulder Pain description: sharp Aggravating factors: turning head to left Relieving factors: lidocaine  patch  PRECAUTIONS: Fall  RED FLAGS: None   WEIGHT BEARING RESTRICTIONS: No  FALLS:  Has patient fallen in last 6 months? Yes. Number of falls 2; at hospital slid out of WC  LIVING ENVIRONMENT: Lives with: lives with their spouse Lives in: House/apartment Stairs: Yes: Internal: 10 steps; none Has following equipment at home: Vannie - 2 wheeled, Grab bars, and Ramped entry  OCCUPATION: retired neurosurgeon  PLOF: Independent with household mobility with device and wife assists with buttons and sometimes with bathing  PATIENT GOALS: to walk like I used to; do steps better  NEXT MD VISIT: next month  OBJECTIVE:  Note: Objective measures were completed at Evaluation unless otherwise noted.  DIAGNOSTIC FINDINGS: I63.531 (ICD-10-CM) - Acute right PCA stroke (HCC)  PATIENT SURVEYS:  LEFS  Extreme difficulty/unable (0), Quite a bit of difficulty (1), Moderate difficulty (2), Little difficulty (3), No difficulty (4) Survey date:    Any of your usual work, housework or school activities   2. Usual hobbies, recreational or sporting activities   3. Getting into/out of the bath   4. Walking between rooms   5. Putting on socks/shoes   6. Squatting    7. Lifting an object, like a bag of groceries from the floor   8. Performing light activities around your home   9. Performing heavy activities around your home   10. Getting into/out of a car   11. Walking 2 blocks   12. Walking 1 mile   13. Going up/down 10 stairs (1 flight)   14. Standing for 1 hour   15.  sitting for 1 hour   16. Running on even ground   17. Running on uneven ground   18. Making sharp turns while running fast   19. Hopping    20. Rolling over in bed   Score total:  21/80; 26.3%      COGNITION: Overall cognitive status: Within functional limits for tasks assessed     SENSATION:   EDEMA:  None noted  MUSCLE LENGTH: Hamstrings:   POSTURE: flexed trunk   PALPATION:   LOWER EXTREMITY ROM:  Active ROM Right eval Left eval  Hip flexion    Hip extension    Hip abduction    Hip adduction    Hip internal rotation    Hip external rotation    Knee flexion    Knee extension    Ankle dorsiflexion    Ankle plantarflexion    Ankle inversion    Ankle eversion     (Blank rows = not tested)  LOWER EXTREMITY MMT:  MMT Right eval Left eval  Hip flexion 4+ 4  Hip extension    Hip abduction  Hip adduction    Hip internal rotation    Hip external rotation    Knee flexion    Knee extension 5 4+  Ankle dorsiflexion 5 4  Ankle plantarflexion    Ankle inversion    Ankle eversion     (Blank rows = not tested)   FUNCTIONAL TESTS:  5 times sit to stand: 1.02.43 sec using hands to push up to standing 2 minute walk test: 125 ft with rollator SLS right 7 and left 4  GAIT: Distance walked: 125 ft Assistive device utilized: Environmental Consultant - 4 wheeled Level of assistance: Modified independence Comments: slight toe drop left; decreased gait speed                                                                                                                                TREATMENT DATE:  12/07/23  Review of HEP and goals Sit to stand x 5 with UE assist Sit to stand x 5 with no UE assist; min A to boost up to standing Standing  Bilateral UE assist heel toe raises x 10 SLS right 7 and left 4 4 box toe taps 2 x 10; 1st set with bilateral UE assist; 2nd set with 1 UE assist 4 box step ups 2 x 10 with 1 UE assist Seated on corner of mat table PWR up x 8 PWR rock x 8 PWR twist x 8 Nustep seat 11 x 5' to end treatment level 2   12/03/2023 physical therapy evaluation and HEP instruction    PATIENT EDUCATION:  Education details: Patient educated on  exam findings, POC, scope of PT, HEP, and what to expect next visit. Person educated: Patient Education method: Explanation, Demonstration, and Handouts Education comprehension: verbalized understanding, returned demonstration, verbal cues required, and tactile cues required  HOME EXERCISE PROGRAM: Access Code: [email or web address] URL: [email or web address] Date: 12/03/2023 Prepared by: [facility]  Exercises - Sit to Stand with Armchair  - 2 x daily - 7 x weekly - 1 sets - 5 reps - Standing Ankle Plantar Flexion Dorsiflexion with Counter Support  - 2 x daily - 7 x weekly - 2 sets - 10 reps  ASSESSMENT:  CLINICAL IMPRESSION: Today's session started with a review of HEP and goals; patient verbalizes agreement with set rehab goals.  Checked balance today see SLS.  Decreased balance and control of left lower extremity versus right; noted decreased ability to elevated left UE with PWR rock activity and tends to sit with neck flexed to the left.  Updated HEP with PWR exercises.  Patient will benefit from continued skilled therapy services to address deficits and promote return to optimal function.     Eval:Patient is a 73 y.o. male who was seen today for physical therapy evaluation and treatment for I63.531 (ICD-10-CM) - Acute right PCA stroke (HCC). Patient demonstrates decreased strength, balance deficits and gait abnormalities which are negatively impacting patient ability to perform ADLs and functional mobility  tasks. Patient will benefit from skilled physical therapy services to address these deficits to improve level of function with ADLs, functional mobility tasks, and reduce risk for falls.    OBJECTIVE IMPAIRMENTS: Abnormal gait, increased fascial restrictions, impaired perceived functional ability, and pain.   ACTIVITY LIMITATIONS: carrying, lifting, bending, standing, squatting, stairs, and locomotion level  PARTICIPATION LIMITATIONS: meal prep, cleaning, laundry, driving,  shopping, and occupation  REHAB POTENTIAL: Good  CLINICAL DECISION MAKING: Evolving/moderate complexity  EVALUATION COMPLEXITY: Moderate   GOALS: Goals reviewed with patient? No  SHORT TERM GOALS: Target date: 12/24/2023 patient will be independent with initial HEP and compliant with HEP 3-4 times a week   Baseline: Goal status: in progress  2.  Patient will report 30% improvement overall  Baseline:  Goal status: In progress   LONG TERM GOALS: Target date: 01/14/2024  Patient will be independent in self management strategies to improve quality of life and functional outcomes.  Baseline:  Goal status: in progress  2.  Patient will report 50% improvement overall  Baseline:  Goal status: in progress  3.  Patient will improve LEFS score by 9 points to demonstrate improved perceived function  Baseline: 21/80 Goal status: in progress   4.  Patient will increase distance on 2 MWT to 225 ft or more with LRAD to demonstrate improved speed and efficiency with household and community ambulation  Baseline: 125 fit with rollator Goal status: in progress  5.  Patient will improve 5 times sit to stand score to 30 sec or less to demonstrate improved functional mobility and increased leg strength.  Baseline: 1 min and 2.45 sec Goal status: in progress    PLAN:  PT FREQUENCY: 1-2x/week  PT DURATION: 6 weeks  PLANNED INTERVENTIONS: 97164- PT Re-evaluation, 97110-Therapeutic exercises, 97530- Therapeutic activity, 97112- Neuromuscular re-education, 97535- Self Care, [id number]- Manual therapy, [id number]- Gait training, [phone]- Orthotic Fit/training, [phone]- Canalith repositioning, [id number]- Aquatic Therapy, [id number]- Splinting, [phone]- Wound care (first 20 sq cm), 97598- Wound care (each additional 20 sq cm)Patient/Family education, Balance training, Stair training, Taping, Dry Needling, Joint mobilization, Joint manipulation, Spinal manipulation, Spinal mobilization, Scar mobilization, and DME  instructions.   PLAN FOR NEXT SESSION: states he has a referral for OT and ST as well; will bring next visit; gait and balance training, steps; lower extremity strengthening.    9:04 AM, 12/07/23 Reegan Bouffard Small Timisha Mondry MPT [facility] [place] [phone] [phone]  [facility] Medicare Auth Request Information Treatment Start Date: 12/03/2023  Date of referral: 11/23/2023 Referring provider: [name], MD Referring diagnosis (ICD 10)? P36.468 (ICD-10-CM) - Acute right PCA stroke (HCC) Treatment diagnosis (ICD 10)? (if different than referring diagnosis) R 53.1, R26.2, R26.89  What was this (referring dx) caused by? Other: stroke  Lysle of Condition: Chronic (continuous duration > 3 months)   Laterality: Lt  Current Functional Measure Score: LEFS 21/80  Objective measurements identify impairments when they are compared to normal values, the uninvolved extremity, and prior level of function.  [x]  Yes  []  No  Objective assessment of functional ability: Moderate functional limitations   Briefly describe symptoms: stoke with left side weakness  How did symptoms start: Feb 2025  Average pain intensity:  Last 24 hours: 0-7/10  Past week: 0-7/10  How often does the pt experience symptoms? Constantly  How much have the symptoms interfered with usual daily activities? Moderately  How has condition changed since care began at this facility? A little better  In general, how is the patients overall health? Good  BACK PAIN (STarT Back Screening Tool) No

## 2023-12-09 ENCOUNTER — Ambulatory Visit (HOSPITAL_COMMUNITY): Admitting: Physical Therapy

## 2023-12-09 DIAGNOSIS — R531 Weakness: Secondary | ICD-10-CM | POA: Diagnosis not present

## 2023-12-09 DIAGNOSIS — R262 Difficulty in walking, not elsewhere classified: Secondary | ICD-10-CM

## 2023-12-09 DIAGNOSIS — R2689 Other abnormalities of gait and mobility: Secondary | ICD-10-CM

## 2023-12-09 NOTE — Therapy (Signed)
 OUTPATIENT PHYSICAL THERAPY LOWER EXTREMITY TREATMENT   Patient Name: Edward Rodgers MRN: [id number] DOB:[birth year], 73 y.o., male Today's Date: 12/09/2023  END OF SESSION:  PT End of Session - 12/09/23 1601     Visit Number 3    Number of Visits 12    Date for Recertification  01/14/24    Authorization Type [employer]    Authorization Time Period 12 visits approved 11/20-01/14/24    Authorization - Visit Number 3    Authorization - Number of Visits 12    Progress Note Due on Visit 10    PT Start Time 1420    PT Stop Time 1500    PT Time Calculation (min) 40 min    Activity Tolerance Patient tolerated treatment well    Behavior During Therapy [facility] for tasks assessed/performed           Past Medical History:  Diagnosis Date   Cancer (HCC) 2002   prostate   CHF (congestive heart failure) (HCC)    Gout    Hypertension    Stroke (HCC)    Thyroid  disease    Past Surgical History:  Procedure Laterality Date   COLONOSCOPY N/A 07/15/2017   Procedure: COLONOSCOPY;  Surgeon: Golda Claudis PENNER, MD;  Location: [facility];  Service: Endoscopy;  Laterality: N/A;  830   IR CT HEAD LTD  02/14/2023   IR PERCUTANEOUS ART THROMBECTOMY/INFUSION INTRACRANIAL INC DIAG ANGIO  02/14/2023   LOOP RECORDER INSERTION N/A 02/20/2023   Procedure: LOOP RECORDER INSERTION;  Surgeon: Fernande Elspeth BROCKS, MD;  Location: [facility] INVASIVE CV LAB;  Service: Cardiovascular;  Laterality: N/A;   POLYPECTOMY  07/15/2017   Procedure: POLYPECTOMY;  Surgeon: Golda Claudis PENNER, MD;  Location: [facility];  Service: Endoscopy;;  colon   PROSTATE SURGERY     RADIOLOGY WITH ANESTHESIA Left 02/14/2023   Procedure: IR WITH ANESTHESIA;  Surgeon: Radiologist, Medication, MD;  Location: [facility] OR;  Service: Radiology;  Laterality: Left;   RADIOLOGY WITH ANESTHESIA N/A 02/14/2023   Procedure: IR WITH ANESTHESIA;  Surgeon: Radiologist, Medication, MD;  Location: [facility] OR;  Service: Radiology;  Laterality: N/A;   Patient Active Problem List    Diagnosis Date Noted   Cognitive change 03/17/2023   Hyponatremia 03/13/2023   Pulmonary embolus (HCC) 03/05/2023   Acute deep vein thrombosis (DVT) of popliteal vein of left lower extremity (HCC) 03/05/2023   Chronic heart failure with mildly reduced ejection fraction (HFmrEF, 41-49%) (HCC) 03/05/2023   AKI (acute kidney injury) 03/05/2023   Precordial pain 03/03/2023   Shortness of breath 03/03/2023   Benign hypertension 03/03/2023   Heart failure with improved ejection fraction (HFimpEF) (HCC) 03/03/2023   Coping style affecting medical condition 02/27/2023   Acute right PCA stroke (HCC) 02/21/2023   Acute ischemic right PCA stroke (HCC) 02/14/2023   Posterior cerebral artery embolism, right 02/14/2023   Special screening for malignant neoplasms, colon 04/09/2017   Dizziness 03/29/2013   Cerebellar stroke, acute (HCC) 03/29/2013   CHF (congestive heart failure) (HCC) 03/28/2013   Vertigo 03/28/2013    PCP: Shona Norleen PEDLAR, MD  REFERRING PROVIDER: [name], MD  REFERRING DIAG: [phone] (ICD-10-CM) - Acute right PCA stroke (HCC)  THERAPY DIAG:  Left-sided weakness  Difficulty in walking, not elsewhere classified  Other abnormalities of gait and mobility  Rationale for Evaluation and Treatment: Rehabilitation  ONSET DATE: 02/14/2023  SUBJECTIVE:   SUBJECTIVE STATEMENT: Pt reports a little discomfort on his Lt side under his ribs.  States he thinks  it's where he strains to get out of bed.  Pt broughht his AFO today but completed therapy without it. No other pain or issues.    Eval:02/14/2023; woke up could not move; stroke; [name] and then CIR; then had home health and now referred to outpatient; he has an AFO but not wearing; states it does not fit; asked him to bring in next visit.  Had a WC but sent that back as he was not using it.  Steps most challenging thing for him; no handrails for basement steps. States he is supposed to be having a surgery on his neck?   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   Washer and dryer in basement; wood stove  PERTINENT HISTORY: CHF, HTN, gout, stroke PAIN:  Are you having pain? Yes: NPRS scale: 7/10 Pain location: left upper trap and shoulder Pain description: sharp Aggravating factors: turning head to left Relieving factors: lidocaine  patch  PRECAUTIONS: Fall  RED FLAGS: None   WEIGHT BEARING RESTRICTIONS: No  FALLS:  Has patient fallen in last 6 months? Yes. Number of falls 2; at hospital slid out of WC  LIVING ENVIRONMENT: Lives with: lives with their spouse Lives in: House/apartment Stairs: Yes: Internal: 10 steps; none Has following equipment at home: Vannie - 2 wheeled, Grab bars, and Ramped entry  OCCUPATION: retired neurosurgeon  PLOF: Independent with household mobility with device and wife assists with buttons and sometimes with bathing  PATIENT GOALS: to walk like I used to; do steps better  NEXT MD VISIT: next month  OBJECTIVE:  Note: Objective measures were completed at Evaluation unless otherwise noted.  DIAGNOSTIC FINDINGS: I63.531 (ICD-10-CM) - Acute right PCA stroke (HCC)  PATIENT SURVEYS:  LEFS  Extreme difficulty/unable (0), Quite a bit of difficulty (1), Moderate difficulty (2), Little difficulty (3), No difficulty (4) Survey date:    Any of your usual work, housework or school activities   2. Usual hobbies, recreational or sporting activities   3. Getting into/out of the bath   4. Walking between rooms   5. Putting on socks/shoes   6. Squatting    7. Lifting an object, like a bag of groceries from the floor   8. Performing light activities around your home   9. Performing heavy activities around your home   10. Getting into/out of a car   11. Walking 2 blocks   12. Walking 1 mile   13. Going up/down  10 stairs (1 flight)   14. Standing for 1 hour   15.  sitting for 1 hour   16. Running on even ground   17. Running on uneven ground   18. Making sharp turns while running fast   19. Hopping    20. Rolling over in bed   Score total:  21/80; 26.3%     COGNITION: Overall cognitive status: Within functional limits for tasks assessed     SENSATION:   EDEMA:  None noted  MUSCLE LENGTH: Hamstrings:   POSTURE: flexed trunk   PALPATION:   LOWER EXTREMITY MMT:  MMT Right eval Left eval  Hip flexion 4+ 4  Hip extension    Hip abduction    Hip adduction    Hip internal rotation    Hip external rotation    Knee flexion    Knee  extension 5 4+  Ankle dorsiflexion 5 4  Ankle plantarflexion    Ankle inversion    Ankle eversion     (Blank rows = not tested)   FUNCTIONAL TESTS:  5 times sit to stand: 1.02.43 sec using hands to push up to standing 2 minute walk test: 125 ft with rollator SLS right 7 and left 4  GAIT: Distance walked: 125 ft Assistive device utilized: [employer]ant - 4 wheeled Level of assistance: Modified independence Comments: slight toe drop left; decreased gait speed                                                                                                                                TREATMENT DATE:  12/09/23  Standing in parallel bars with bil UE assist (unless stated):  Heel raises 20X  6 alternating box taps with 1 UE assist  6 forward step ups 1 UE 20X  4 forward lunges with 1 UE assist Sit to stand 2x5 with foam in seat, no UE assist Sit to supine, supine to sit supervision and cues (due to having handbar at home) Supine:  bridge with 5 holds 10X2  SLR 10X each with core stabilization Nustep seat 11 x 5' EOS level 3 UE/LE   12/07/23  Review of HEP and goals Sit to stand x 5 with UE assist Sit to stand x 5 with no UE assist; min A to boost up to standing Standing  Bilateral UE assist heel toe raises x 10 SLS right 7 and  left 4 4 box toe taps 2 x 10; 1st set with bilateral UE assist; 2nd set with 1 UE assist 4 box step ups 2 x 10 with 1 UE assist Seated on corner of mat table PWR up x 8 PWR rock x 8 PWR twist x 8 Nustep seat 11 x 5' to end treatment level 2   12/03/2023 physical therapy evaluation and HEP instruction    PATIENT EDUCATION:  Education details: Patient educated on exam findings, POC, scope of PT, HEP, and what to expect next visit. Person educated: Patient Education method: Explanation, Demonstration, and Handouts Education comprehension: verbalized understanding, returned demonstration, verbal cues required, and tactile cues required  HOME EXERCISE PROGRAM: Access Code: [email or web address] URL: [email or web address] Date: 12/03/2023 Prepared by: [facility]  Exercises - Sit to Stand with Armchair  - 2 x daily - 7 x weekly - 1 sets - 5 reps - Standing Ankle Plantar Flexion Dorsiflexion with Counter Support  - 2 x daily - 7 x weekly - 2 sets - 10 reps  ASSESSMENT:  CLINICAL IMPRESSION: Began session with standing LE stabilization and strengthening exercises.  Decreased UE assist where able and encouraged to use Lt UE rather than Rt.  Increased to 6 step for alternating toe tap and also forward step ups.  Pt with good control and ability to do these with 1 UE only.   Pt able to complete sit to  stands without UE with 2 foam cushion in chair.  Pt did fatigue with 5 repetitions requiring a rest break to complete last 5.  Worked on bed mobility as pt reported straining with this. Pt points out he has a rail that he pulls up on.  Worked on pushing and using logroll technique rather than pulling up.  Encouraged pt to not do this at home as well as he was able to complete this with ease.  Also encouraged to continue doing bridge with holds and SLR with core activation as well.  Finished session with nustep with good mobility of UE/LE.  Checked with front end staff regarding OT and ST  referrals and we have not received these.  Instructed pt to contact MD that referred him here and ask to send referral for OT and ST if he would like to have these services as well.  Pt verbalized understanding.  Patient will continue to benefit from continued skilled therapy services to address deficits and promote return to optimal function.     Eval:Patient is a 73 y.o. male who was seen today for physical therapy evaluation and treatment for I63.531 (ICD-10-CM) - Acute right PCA stroke (HCC). Patient demonstrates decreased strength, balance deficits and gait abnormalities which are negatively impacting patient ability to perform ADLs and functional mobility tasks. Patient will benefit from skilled physical therapy services to address these deficits to improve level of function with ADLs, functional mobility tasks, and reduce risk for falls.    OBJECTIVE IMPAIRMENTS: Abnormal gait, increased fascial restrictions, impaired perceived functional ability, and pain.   ACTIVITY LIMITATIONS: carrying, lifting, bending, standing, squatting, stairs, and locomotion level  PARTICIPATION LIMITATIONS: meal prep, cleaning, laundry, driving, shopping, and occupation  REHAB POTENTIAL: Good  CLINICAL DECISION MAKING: Evolving/moderate complexity  EVALUATION COMPLEXITY: Moderate   GOALS: Goals reviewed with patient? No  SHORT TERM GOALS: Target date: 12/24/2023 patient will be independent with initial HEP and compliant with HEP 3-4 times a week   Baseline: Goal status: in progress  2.  Patient will report 30% improvement overall  Baseline:  Goal status: In progress   LONG TERM GOALS: Target date: 01/14/2024  Patient will be independent in self management strategies to improve quality of life and functional outcomes.  Baseline:  Goal status: in progress  2.  Patient will report 50% improvement overall  Baseline:  Goal status: in progress  3.  Patient will improve LEFS score by 9 points to  demonstrate improved perceived function  Baseline: 21/80 Goal status: in progress   4.  Patient will increase distance on 2 MWT to 225 ft or more with LRAD to demonstrate improved speed and efficiency with household and community ambulation  Baseline: 125 fit with rollator Goal status: in progress  5.  Patient will improve 5 times sit to stand score to 30 sec or less to demonstrate improved functional mobility and increased leg strength.  Baseline: 1 min and 2.45 sec Goal status: in progress    PLAN:  PT FREQUENCY: 1-2x/week  PT DURATION: 6 weeks  PLANNED INTERVENTIONS: 97164- PT Re-evaluation, 97110-Therapeutic exercises, 97530- Therapeutic activity, 97112- Neuromuscular re-education, 97535- Self Care, [id number]- Manual therapy, [id number]- Gait training, [id number]- Orthotic Fit/training, [phone]- Canalith repositioning, [id number]- Aquatic Therapy, [phone]- Splinting, [phone]- Wound care (first 20 sq cm), 97598- Wound care (each additional 20 sq cm)Patient/Family education, Balance training, Stair training, Taping, Dry Needling, Joint mobilization, Joint manipulation, Spinal manipulation, Spinal mobilization, Scar mobilization, and DME instructions.   PLAN FOR NEXT SESSION: F/U if  pt called MD to request order be sent for OT and ST as well. Progress LE functional strength and stabilization.     4:03 PM, 12/09/23 Greig KATHEE Fuse, PTA/CLT [facility] Ph: [phone]

## 2023-12-13 ENCOUNTER — Ambulatory Visit: Attending: Cardiology

## 2023-12-13 DIAGNOSIS — I639 Cerebral infarction, unspecified: Secondary | ICD-10-CM | POA: Diagnosis not present

## 2023-12-14 ENCOUNTER — Telehealth: Payer: Self-pay

## 2023-12-14 ENCOUNTER — Ambulatory Visit (HOSPITAL_COMMUNITY): Attending: Internal Medicine

## 2023-12-14 DIAGNOSIS — R29818 Other symptoms and signs involving the nervous system: Secondary | ICD-10-CM | POA: Insufficient documentation

## 2023-12-14 DIAGNOSIS — R41841 Cognitive communication deficit: Secondary | ICD-10-CM | POA: Insufficient documentation

## 2023-12-14 DIAGNOSIS — R1312 Dysphagia, oropharyngeal phase: Secondary | ICD-10-CM | POA: Diagnosis present

## 2023-12-14 DIAGNOSIS — R531 Weakness: Secondary | ICD-10-CM | POA: Insufficient documentation

## 2023-12-14 DIAGNOSIS — R278 Other lack of coordination: Secondary | ICD-10-CM | POA: Insufficient documentation

## 2023-12-14 DIAGNOSIS — I63531 Cerebral infarction due to unspecified occlusion or stenosis of right posterior cerebral artery: Secondary | ICD-10-CM | POA: Insufficient documentation

## 2023-12-14 DIAGNOSIS — R262 Difficulty in walking, not elsewhere classified: Secondary | ICD-10-CM | POA: Diagnosis present

## 2023-12-14 DIAGNOSIS — R2689 Other abnormalities of gait and mobility: Secondary | ICD-10-CM | POA: Diagnosis present

## 2023-12-14 LAB — CUP PACEART REMOTE DEVICE CHECK
Date Time Interrogation Session: 20251129233524
Implantable Pulse Generator Implant Date: 20250207

## 2023-12-14 NOTE — Therapy (Signed)
 OUTPATIENT PHYSICAL THERAPY LOWER EXTREMITY TREATMENT   Patient Name: Edward Rodgers MRN: [id number] DOB:[birth year], 73 y.o., male Today's Date: 12/14/2023  END OF SESSION:  PT End of Session - 12/14/23 0910     Visit Number 4    Number of Visits 12    Date for Recertification  01/14/24    Authorization Type [employer] Medicare    Authorization Time Period 12 visits approved 11/20-01/14/24    Authorization - Visit Number 4    Authorization - Number of Visits 12    Progress Note Due on Visit 10    PT Start Time 0909   late arrival   PT Stop Time 0945    PT Time Calculation (min) 36 min    Activity Tolerance Patient tolerated treatment well    Behavior During Therapy [facility] for tasks assessed/performed           Past Medical History:  Diagnosis Date   Cancer (HCC) 2002   prostate   CHF (congestive heart failure) (HCC)    Gout    Hypertension    Stroke (HCC)    Thyroid  disease    Past Surgical History:  Procedure Laterality Date   COLONOSCOPY N/A 07/15/2017   Procedure: COLONOSCOPY;  Surgeon: Golda Claudis PENNER, MD;  Location: [facility];  Service: Endoscopy;  Laterality: N/A;  830   IR CT HEAD LTD  02/14/2023   IR PERCUTANEOUS ART THROMBECTOMY/INFUSION INTRACRANIAL INC DIAG ANGIO  02/14/2023   LOOP RECORDER INSERTION N/A 02/20/2023   Procedure: LOOP RECORDER INSERTION;  Surgeon: Fernande Elspeth BROCKS, MD;  Location: [facility] INVASIVE CV LAB;  Service: Cardiovascular;  Laterality: N/A;   POLYPECTOMY  07/15/2017   Procedure: POLYPECTOMY;  Surgeon: Golda Claudis PENNER, MD;  Location: [facility];  Service: Endoscopy;;  colon   PROSTATE SURGERY     RADIOLOGY WITH ANESTHESIA Left 02/14/2023   Procedure: IR WITH ANESTHESIA;  Surgeon: Radiologist, Medication, MD;  Location: [facility] OR;  Service: Radiology;  Laterality: Left;   RADIOLOGY WITH ANESTHESIA N/A 02/14/2023   Procedure: IR WITH ANESTHESIA;  Surgeon: Radiologist, Medication, MD;  Location: [facility] OR;  Service: Radiology;  Laterality: N/A;   Patient Active  Problem List   Diagnosis Date Noted   Cognitive change 03/17/2023   Hyponatremia 03/13/2023   Pulmonary embolus (HCC) 03/05/2023   Acute deep vein thrombosis (DVT) of popliteal vein of left lower extremity (HCC) 03/05/2023   Chronic heart failure with mildly reduced ejection fraction (HFmrEF, 41-49%) (HCC) 03/05/2023   AKI (acute kidney injury) 03/05/2023   Precordial pain 03/03/2023   Shortness of breath 03/03/2023   Benign hypertension 03/03/2023   Heart failure with improved ejection fraction (HFimpEF) (HCC) 03/03/2023   Coping style affecting medical condition 02/27/2023   Acute right PCA stroke (HCC) 02/21/2023   Acute ischemic right PCA stroke (HCC) 02/14/2023   Posterior cerebral artery embolism, right 02/14/2023   Special screening for malignant neoplasms, colon 04/09/2017   Dizziness 03/29/2013   Cerebellar stroke, acute (HCC) 03/29/2013   CHF (congestive heart failure) (HCC) 03/28/2013   Vertigo 03/28/2013    PCP: Shona Norleen PEDLAR, MD  REFERRING PROVIDER: [name], MD  REFERRING DIAG: [phone] (ICD-10-CM) - Acute right PCA stroke (HCC)  THERAPY DIAG:  Left-sided weakness  Difficulty in walking, not elsewhere classified  Other abnormalities of gait and mobility  Rationale for Evaluation and Treatment: Rehabilitation  ONSET DATE: 02/14/2023  SUBJECTIVE:   SUBJECTIVE STATEMENT: No new issues; called MD to request order for OT and ST but  office was closed last week so will call back today. Reports easier to get out of bed with new technique taught last visit  Eval:02/14/2023; woke up could not move; stroke; [name] and then CIR; then had home health and now referred to outpatient; he has an AFO but not wearing; states it does not fit; asked him to bring in next visit.  Had a WC but sent that back as he was not using it.  Steps most challenging thing for him; no handrails for basement steps. States he is supposed to be having a surgery on his neck?  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   Washer and dryer in basement; wood stove  PERTINENT HISTORY: CHF, HTN, gout, stroke PAIN:  Are you having pain? Yes: NPRS scale: 7/10 Pain location: left upper trap and shoulder Pain description: sharp Aggravating factors: turning head to left Relieving factors: lidocaine  patch  PRECAUTIONS: Fall  RED FLAGS: None   WEIGHT BEARING RESTRICTIONS: No  FALLS:  Has patient fallen in last 6 months? Yes. Number of falls 2; at hospital slid out of WC  LIVING ENVIRONMENT: Lives with: lives with their spouse Lives in: House/apartment Stairs: Yes: Internal: 10 steps; none Has following equipment at home: Vannie - 2 wheeled, Grab bars, and Ramped entry  OCCUPATION: retired neurosurgeon  PLOF: Independent with household mobility with device and wife assists with buttons and sometimes with bathing  PATIENT GOALS: to walk like I used to; do steps better  NEXT MD VISIT: next month  OBJECTIVE:  Note: Objective measures were completed at Evaluation unless otherwise noted.  DIAGNOSTIC FINDINGS: I63.531 (ICD-10-CM) - Acute right PCA stroke (HCC)  PATIENT SURVEYS:  LEFS  Extreme difficulty/unable (0), Quite a bit of difficulty (1), Moderate difficulty (2), Little difficulty (3), No difficulty (4) Survey date:    Any of your usual work, housework or school activities   2. Usual hobbies, recreational or sporting activities   3. Getting into/out of the bath   4. Walking between rooms   5. Putting on socks/shoes   6. Squatting    7. Lifting an object, like a bag of groceries from the floor   8. Performing light activities around your home   9. Performing heavy activities around your home   10. Getting into/out of a car   11. Walking 2 blocks   12. Walking 1 mile   13. Going up/down 10  stairs (1 flight)   14. Standing for 1 hour   15.  sitting for 1 hour   16. Running on even ground   17. Running on uneven ground   18. Making sharp turns while running fast   19. Hopping    20. Rolling over in bed   Score total:  21/80; 26.3%     COGNITION: Overall cognitive status: Within functional limits for tasks assessed     SENSATION:   EDEMA:  None noted  MUSCLE LENGTH: Hamstrings:   POSTURE: flexed trunk   PALPATION:   LOWER EXTREMITY MMT:  MMT Right eval Left eval  Hip flexion 4+ 4  Hip extension    Hip abduction    Hip adduction    Hip internal rotation    Hip external rotation    Knee flexion    Knee extension 5 4+  Ankle dorsiflexion 5 4  Ankle plantarflexion    Ankle inversion    Ankle eversion     (Blank rows = not tested)   FUNCTIONAL TESTS:  5 times sit to stand: 1.02.43 sec using hands to push up to standing 2 minute walk test: 125 ft with rollator SLS right 7 and left 4  GAIT: Distance walked: 125 ft Assistive device utilized: [employer]ant - 4 wheeled Level of assistance: Modified independence Comments: slight toe drop left; decreased gait speed                                                                                                                                TREATMENT DATE:  12/14/2023 Nustep seat 11 x 5' level 3 dynamic warm up 1 hand heel raises on incline x 20 1 hand toe raises on decline x 20 Sit to stand with foam in chair 2 x 5 no UE assist Seated left upper trap stretch x 5 Cervical retractions x 10 Stagger stance x 30 each Tandem stance x 30 each 2# marching with 1 UE assist x 10 then no UE assist x 10 2# sidestepping down and back // bars x 1   12/09/23  Standing in parallel bars with bil UE assist (unless stated):  Heel raises 20X  6 alternating box taps with 1 UE assist  6 forward step ups 1 UE 20X  4 forward lunges with 1 UE assist Sit to stand 2x5 with foam in seat, no UE assist Sit to  supine, supine to sit supervision and cues (due to having handbar at home) Supine:  bridge with 5 holds 10X2  SLR 10X each with core stabilization Nustep seat 11 x 5' EOS level 3 UE/LE   12/07/23  Review of HEP and goals Sit to stand x 5 with UE assist Sit to stand x 5 with no UE assist; min A to boost up to standing Standing  Bilateral UE assist heel toe raises x 10 SLS right 7 and left 4 4 box toe taps 2 x 10; 1st set with bilateral UE assist; 2nd set with 1 UE assist 4 box step ups 2 x 10 with 1 UE assist Seated on corner of mat table PWR up x 8 PWR rock x 8 PWR twist x 8 Nustep seat 11 x 5' to end treatment level 2   12/03/2023 physical therapy evaluation and HEP instruction    PATIENT EDUCATION:  Education details: Patient educated on exam findings, POC, scope of PT, HEP, and what to expect next visit. Person educated: Patient Education method: Explanation, Demonstration, and Handouts Education comprehension: verbalized understanding, returned demonstration, verbal cues required, and tactile cues required  HOME EXERCISE PROGRAM: Access Code: [email or web address] URL: [email or web address] Date: 12/03/2023 Prepared by: [facility]  Exercises - Sit to Stand with Armchair  - 2 x daily - 7 x weekly - 1 sets - 5 reps - Standing Ankle Plantar Flexion Dorsiflexion with  Counter Support  - 2 x daily - 7 x weekly - 2 sets - 10 reps  ASSESSMENT:  CLINICAL IMPRESSION: Late arrival today.  Patient needs cues for nose over toes with sit to stand to improve weight shift forward to complete transfer.  Of note patient with tight left upper trap and left lateral flexion of neck; states this was present before his stroke but is worse now.  Added cervical mobility to address.   Max challenge with marching without UE assist today.   Patient will continue to benefit from continued skilled therapy services to address deficits and promote return to optimal function.      Eval:Patient is a 73 y.o. male who was seen today for physical therapy evaluation and treatment for I63.531 (ICD-10-CM) - Acute right PCA stroke (HCC). Patient demonstrates decreased strength, balance deficits and gait abnormalities which are negatively impacting patient ability to perform ADLs and functional mobility tasks. Patient will benefit from skilled physical therapy services to address these deficits to improve level of function with ADLs, functional mobility tasks, and reduce risk for falls.    OBJECTIVE IMPAIRMENTS: Abnormal gait, increased fascial restrictions, impaired perceived functional ability, and pain.   ACTIVITY LIMITATIONS: carrying, lifting, bending, standing, squatting, stairs, and locomotion level  PARTICIPATION LIMITATIONS: meal prep, cleaning, laundry, driving, shopping, and occupation  REHAB POTENTIAL: Good  CLINICAL DECISION MAKING: Evolving/moderate complexity  EVALUATION COMPLEXITY: Moderate   GOALS: Goals reviewed with patient? No  SHORT TERM GOALS: Target date: 12/24/2023 patient will be independent with initial HEP and compliant with HEP 3-4 times a week   Baseline: Goal status: in progress  2.  Patient will report 30% improvement overall  Baseline:  Goal status: In progress   LONG TERM GOALS: Target date: 01/14/2024  Patient will be independent in self management strategies to improve quality of life and functional outcomes.  Baseline:  Goal status: in progress  2.  Patient will report 50% improvement overall  Baseline:  Goal status: in progress  3.  Patient will improve LEFS score by 9 points to demonstrate improved perceived function  Baseline: 21/80 Goal status: in progress   4.  Patient will increase distance on 2 MWT to 225 ft or more with LRAD to demonstrate improved speed and efficiency with household and community ambulation  Baseline: 125 fit with rollator Goal status: in progress  5.  Patient will improve 5 times sit  to stand score to 30 sec or less to demonstrate improved functional mobility and increased leg strength.  Baseline: 1 min and 2.45 sec Goal status: in progress    PLAN:  PT FREQUENCY: 1-2x/week  PT DURATION: 6 weeks  PLANNED INTERVENTIONS: [id number]- PT Re-evaluation, 97110-Therapeutic exercises, 97530- Therapeutic activity, 97112- Neuromuscular re-education, 97535- Self Care, [id number]- Manual therapy, [id number]- Gait training, [phone]- Orthotic Fit/training, [phone]- Canalith repositioning, [id number]- Aquatic Therapy, [phone]- Splinting, [phone]- Wound care (first 20 sq cm), 97598- Wound care (each additional 20 sq cm)Patient/Family education, Balance training, Stair training, Taping, Dry Needling, Joint mobilization, Joint manipulation, Spinal manipulation, Spinal mobilization, Scar mobilization, and DME instructions.   PLAN FOR NEXT SESSION: F/U if pt called MD to request order be sent for OT and ST as well. Progress LE functional strength and stabilization; balance and gait     9:12 AM, 12/14/23 Trannie Bardales Small Kenijah Benningfield MPT [facility] [place] [phone] [phone]

## 2023-12-14 NOTE — Telephone Encounter (Signed)
 Patient is getting PT at [facility] at [facility] and he wants to add OT and ST to the referral.

## 2023-12-17 NOTE — Progress Notes (Signed)
 Remote Loop Recorder Transmission

## 2023-12-21 ENCOUNTER — Ambulatory Visit (HOSPITAL_COMMUNITY)

## 2023-12-21 DIAGNOSIS — R531 Weakness: Secondary | ICD-10-CM | POA: Diagnosis not present

## 2023-12-21 DIAGNOSIS — R262 Difficulty in walking, not elsewhere classified: Secondary | ICD-10-CM

## 2023-12-21 DIAGNOSIS — R2689 Other abnormalities of gait and mobility: Secondary | ICD-10-CM

## 2023-12-21 NOTE — Therapy (Signed)
 OUTPATIENT PHYSICAL THERAPY LOWER EXTREMITY TREATMENT   Patient Name: Edward Rodgers MRN: [id number] DOB:[birth year], 73 y.o., male Today's Date: 12/21/2023  END OF SESSION:  PT End of Session - 12/21/23 0909     Visit Number 5    Number of Visits 12    Date for Recertification  01/14/24    Authorization Type [employer]    Authorization Time Period 12 visits approved 11/20-01/14/24    Authorization - Visit Number 5    Authorization - Number of Visits 12    Progress Note Due on Visit 10    PT Start Time 0910   late check in   PT Stop Time 0945    PT Time Calculation (min) 35 min    Activity Tolerance Patient tolerated treatment well    Behavior During Therapy [name] for tasks assessed/performed           Past Medical History:  Diagnosis Date   Cancer (HCC) 2002   prostate   CHF (congestive heart failure) (HCC)    Gout    Hypertension    Stroke (HCC)    Thyroid  disease    Past Surgical History:  Procedure Laterality Date   COLONOSCOPY N/A 07/15/2017   Procedure: COLONOSCOPY;  Surgeon: Golda Claudis PENNER, MD;  Location: [facility];  Service: Endoscopy;  Laterality: N/A;  830   IR CT HEAD LTD  02/14/2023   IR PERCUTANEOUS ART THROMBECTOMY/INFUSION INTRACRANIAL INC DIAG ANGIO  02/14/2023   LOOP RECORDER INSERTION N/A 02/20/2023   Procedure: LOOP RECORDER INSERTION;  Surgeon: Fernande Elspeth BROCKS, MD;  Location: [facility] INVASIVE CV LAB;  Service: Cardiovascular;  Laterality: N/A;   POLYPECTOMY  07/15/2017   Procedure: POLYPECTOMY;  Surgeon: Golda Claudis PENNER, MD;  Location: [facility];  Service: Endoscopy;;  colon   PROSTATE SURGERY     RADIOLOGY WITH ANESTHESIA Left 02/14/2023   Procedure: IR WITH ANESTHESIA;  Surgeon: Radiologist, Medication, MD;  Location: [facility] OR;  Service: Radiology;  Laterality: Left;   RADIOLOGY WITH ANESTHESIA N/A 02/14/2023   Procedure: IR WITH ANESTHESIA;  Surgeon: Radiologist, Medication, MD;  Location: [facility] OR;  Service: Radiology;  Laterality: N/A;   Patient Active  Problem List   Diagnosis Date Noted   Cognitive change 03/17/2023   Hyponatremia 03/13/2023   Pulmonary embolus (HCC) 03/05/2023   Acute deep vein thrombosis (DVT) of popliteal vein of left lower extremity (HCC) 03/05/2023   Chronic heart failure with mildly reduced ejection fraction (HFmrEF, 41-49%) (HCC) 03/05/2023   AKI (acute kidney injury) 03/05/2023   Precordial pain 03/03/2023   Shortness of breath 03/03/2023   Benign hypertension 03/03/2023   Heart failure with improved ejection fraction (HFimpEF) (HCC) 03/03/2023   Coping style affecting medical condition 02/27/2023   Acute right PCA stroke (HCC) 02/21/2023   Acute ischemic right PCA stroke (HCC) 02/14/2023   Posterior cerebral artery embolism, right 02/14/2023   Special screening for malignant neoplasms, colon 04/09/2017   Dizziness 03/29/2013   Cerebellar stroke, acute (HCC) 03/29/2013   CHF (congestive heart failure) (HCC) 03/28/2013   Vertigo 03/28/2013    PCP: Shona Norleen PEDLAR, MD  REFERRING PROVIDER: [name], MD  REFERRING DIAG: [phone] (ICD-10-CM) - Acute right PCA stroke (HCC)  THERAPY DIAG:  Left-sided weakness  Difficulty in walking, not elsewhere classified  Other abnormalities of gait and mobility  Rationale for Evaluation and Treatment: Rehabilitation  ONSET DATE: 02/14/2023  SUBJECTIVE:   SUBJECTIVE STATEMENT: Late arrival.  Called his MD last week about Speech and OT  referral. Hopes to hear back today.   Eval:02/14/2023; woke up could not move; stroke; [name] and then CIR; then had home health and now referred to outpatient; he has an AFO but not wearing; states it does not fit; asked him to bring in next visit.  Had a WC but sent that back as he was not using it.  Steps most challenging thing for him; no handrails for basement steps. States he is supposed to be having a surgery on his neck?  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   Washer and dryer in basement; wood stove  PERTINENT HISTORY: CHF, HTN, gout, stroke PAIN:  Are you having pain? Yes: NPRS scale: 7/10 Pain location: left upper trap and shoulder Pain description: sharp Aggravating factors: turning head to left Relieving factors: lidocaine  patch  PRECAUTIONS: Fall  RED FLAGS: None   WEIGHT BEARING RESTRICTIONS: No  FALLS:  Has patient fallen in last 6 months? Yes. Number of falls 2; at hospital slid out of WC  LIVING ENVIRONMENT: Lives with: lives with their spouse Lives in: House/apartment Stairs: Yes: Internal: 10 steps; none Has following equipment at home: Vannie - 2 wheeled, Grab bars, and Ramped entry  OCCUPATION: retired neurosurgeon  PLOF: Independent with household mobility with device and wife assists with buttons and sometimes with bathing  PATIENT GOALS: to walk like I used to; do steps better  NEXT MD VISIT: next month  OBJECTIVE:  Note: Objective measures were completed at Evaluation unless otherwise noted.  DIAGNOSTIC FINDINGS: I63.531 (ICD-10-CM) - Acute right PCA stroke (HCC)  PATIENT SURVEYS:  LEFS  Extreme difficulty/unable (0), Quite a bit of difficulty (1), Moderate difficulty (2), Little difficulty (3), No difficulty (4) Survey date:    Any of your usual work, housework or school activities   2. Usual hobbies, recreational or sporting activities   3. Getting into/out of the bath   4. Walking between rooms   5. Putting on socks/shoes   6. Squatting    7. Lifting an object, like a bag of groceries from the floor   8. Performing light activities around your home   9. Performing heavy activities around your home   10. Getting into/out of a car   11. Walking 2 blocks   12. Walking 1 mile   13. Going up/down 10 stairs (1 flight)   14. Standing for 1 hour   15.  sitting for 1 hour   16. Running on even  ground   17. Running on uneven ground   18. Making sharp turns while running fast   19. Hopping    20. Rolling over in bed   Score total:  21/80; 26.3%     COGNITION: Overall cognitive status: Within functional limits for tasks assessed     SENSATION:   EDEMA:  None noted  MUSCLE LENGTH: Hamstrings:   POSTURE: flexed trunk   PALPATION:   LOWER EXTREMITY MMT:  MMT Right eval Left eval  Hip flexion 4+ 4  Hip extension    Hip abduction    Hip adduction    Hip internal rotation    Hip external rotation    Knee flexion    Knee extension 5 4+  Ankle dorsiflexion 5 4  Ankle plantarflexion    Ankle inversion    Ankle  eversion     (Blank rows = not tested)   FUNCTIONAL TESTS:  5 times sit to stand: 1.02.43 sec using hands to push up to standing 2 minute walk test: 125 ft with rollator SLS right 7 and left 4  GAIT: Distance walked: 125 ft Assistive device utilized: Walker - 4 wheeled Level of assistance: Modified independence Comments: slight toe drop left; decreased gait speed                                                                                                                                TREATMENT DATE:  12/21/23 Nustep seat 11 x 5' level 3 dynamic warm up Sit to stand with blue foam in chair no UE assist x 10 Sit to stand no foam with 1 UE assist x 5 Sit to stand no foam with no UE assist but needs min A to boost up to standing x 5 Heel raises on incline with 1 UE assist 2 x 10 Toe raises on decline with 2 UE assist 2 x 10 3# marching 2 x10 with 1 UE assist 3# hip abduction 2 x 10 with 2 UE assist 3# hip extension 2 x 10 with 2 UE assist   12/14/2023 Nustep seat 11 x 5' level 3 dynamic warm up 1 hand heel raises on incline x 20 1 hand toe raises on decline x 20 Sit to stand with foam in chair 2 x 5 no UE assist Seated left upper trap stretch x 5 Cervical retractions x 10 Stagger stance x 30 each Tandem stance x 30 each 2#  marching with 1 UE assist x 10 then no UE assist x 10 2# sidestepping down and back // bars x 1   12/09/23  Standing in parallel bars with bil UE assist (unless stated):  Heel raises 20X  6 alternating box taps with 1 UE assist  6 forward step ups 1 UE 20X  4 forward lunges with 1 UE assist Sit to stand 2x5 with foam in seat, no UE assist Sit to supine, supine to sit supervision and cues (due to having handbar at home) Supine:  bridge with 5 holds 10X2  SLR 10X each with core stabilization Nustep seat 11 x 5' EOS level 3 UE/LE   12/07/23  Review of HEP and goals Sit to stand x 5 with UE assist Sit to stand x 5 with no UE assist; min A to boost up to standing Standing  Bilateral UE assist heel toe raises x 10 SLS right 7 and left 4 4 box toe taps 2 x 10; 1st set with bilateral UE assist; 2nd set with 1 UE assist 4 box step ups 2 x 10 with 1 UE assist Seated on corner of mat table PWR up x 8 PWR rock x 8 PWR twist x 8 Nustep seat 11 x 5' to end treatment level 2   12/03/2023 physical therapy evaluation and HEP instruction  PATIENT EDUCATION:  Education details: Patient educated on exam findings, POC, scope of PT, HEP, and what to expect next visit. Person educated: Patient Education method: Explanation, Demonstration, and Handouts Education comprehension: verbalized understanding, returned demonstration, verbal cues required, and tactile cues required  HOME EXERCISE PROGRAM: Access Code: [email or web address] URL: [email or web address] Date: 12/03/2023 Prepared by: [facility]  Exercises - Sit to Stand with Armchair  - 2 x daily - 7 x weekly - 1 sets - 5 reps - Standing Ankle Plantar Flexion Dorsiflexion with Counter Support  - 2 x daily - 7 x weekly - 2 sets - 10 reps  ASSESSMENT:  CLINICAL IMPRESSION: Late arrival today. Needs cues to look ahead to avoid cervical flexed position; looking at the floor. One minor loss of balance with marching but able to  correct with min A from PT.   Noted increased trunk lateral flexion to the right with left hip abduction but improves with cues  Patient will continue to benefit from continued skilled therapy services to address deficits and promote return to optimal function.     Eval:Patient is a 73 y.o. male who was seen today for physical therapy evaluation and treatment for I63.531 (ICD-10-CM) - Acute right PCA stroke (HCC). Patient demonstrates decreased strength, balance deficits and gait abnormalities which are negatively impacting patient ability to perform ADLs and functional mobility tasks. Patient will benefit from skilled physical therapy services to address these deficits to improve level of function with ADLs, functional mobility tasks, and reduce risk for falls.    OBJECTIVE IMPAIRMENTS: Abnormal gait, increased fascial restrictions, impaired perceived functional ability, and pain.   ACTIVITY LIMITATIONS: carrying, lifting, bending, standing, squatting, stairs, and locomotion level  PARTICIPATION LIMITATIONS: meal prep, cleaning, laundry, driving, shopping, and occupation  REHAB POTENTIAL: Good  CLINICAL DECISION MAKING: Evolving/moderate complexity  EVALUATION COMPLEXITY: Moderate   GOALS: Goals reviewed with patient? No  SHORT TERM GOALS: Target date: 12/24/2023 patient will be independent with initial HEP and compliant with HEP 3-4 times a week   Baseline: Goal status: in progress  2.  Patient will report 30% improvement overall  Baseline:  Goal status: In progress   LONG TERM GOALS: Target date: 01/14/2024  Patient will be independent in self management strategies to improve quality of life and functional outcomes.  Baseline:  Goal status: in progress  2.  Patient will report 50% improvement overall  Baseline:  Goal status: in progress  3.  Patient will improve LEFS score by 9 points to demonstrate improved perceived function  Baseline: 21/80 Goal status: in  progress   4.  Patient will increase distance on 2 MWT to 225 ft or more with LRAD to demonstrate improved speed and efficiency with household and community ambulation  Baseline: 125 fit with rollator Goal status: in progress  5.  Patient will improve 5 times sit to stand score to 30 sec or less to demonstrate improved functional mobility and increased leg strength.  Baseline: 1 min and 2.45 sec Goal status: in progress    PLAN:  PT FREQUENCY: 1-2x/week  PT DURATION: 6 weeks  PLANNED INTERVENTIONS: 97164- PT Re-evaluation, 97110-Therapeutic exercises, 97530- Therapeutic activity, 97112- Neuromuscular re-education, 97535- Self Care, [id number]- Manual therapy, [id number]- Gait training, [phone]- Orthotic Fit/training, [phone]- Canalith repositioning, [id number]- Aquatic Therapy, [phone]- Splinting, [phone]- Wound care (first 20 sq cm), 97598- Wound care (each additional 20 sq cm)Patient/Family education, Balance training, Stair training, Taping, Dry Needling, Joint mobilization, Joint manipulation, Spinal manipulation, Spinal mobilization, Scar mobilization, and DME instructions.  PLAN FOR NEXT SESSION: F/U if pt called MD to request order be sent for OT and ST as well. Progress LE functional strength and stabilization; balance and gait     9:44 AM, 12/21/23 Coreon Simkins Small Kolter Reaver MPT [facility] [place] [phone] Ph:[phone]

## 2023-12-23 ENCOUNTER — Encounter: Payer: Self-pay | Admitting: Cardiology

## 2023-12-28 ENCOUNTER — Ambulatory Visit (HOSPITAL_COMMUNITY)

## 2023-12-28 DIAGNOSIS — R531 Weakness: Secondary | ICD-10-CM

## 2023-12-28 DIAGNOSIS — R262 Difficulty in walking, not elsewhere classified: Secondary | ICD-10-CM

## 2023-12-28 DIAGNOSIS — R2689 Other abnormalities of gait and mobility: Secondary | ICD-10-CM

## 2023-12-28 NOTE — Therapy (Signed)
 OUTPATIENT PHYSICAL THERAPY LOWER EXTREMITY TREATMENT   Patient Name: Edward Rodgers MRN: [id number] DOB:May 06, 1950, 73 y.o., male Today's Date: 12/28/2023  END OF SESSION:  PT End of Session - 12/28/23 0909     Visit Number 6    Number of Visits 12    Date for Recertification  01/14/24    Authorization Type [employer]    Authorization Time Period 12 visits approved 11/20-01/14/24    Authorization - Visit Number 6    Authorization - Number of Visits 12    Progress Note Due on Visit 10    PT Start Time 0908    PT Stop Time 0946    PT Time Calculation (min) 38 min    Activity Tolerance Patient tolerated treatment well    Behavior During Therapy [facility] for tasks assessed/performed           Past Medical History:  Diagnosis Date   Cancer (HCC) 2002   prostate   CHF (congestive heart failure) (HCC)    Gout    Hypertension    Stroke (HCC)    Thyroid  disease    Past Surgical History:  Procedure Laterality Date   COLONOSCOPY N/A 07/15/2017   Procedure: COLONOSCOPY;  Surgeon: Golda Claudis PENNER, MD;  Location: [facility];  Service: Endoscopy;  Laterality: N/A;  830   IR CT HEAD LTD  02/14/2023   IR PERCUTANEOUS ART THROMBECTOMY/INFUSION INTRACRANIAL INC DIAG ANGIO  02/14/2023   LOOP RECORDER INSERTION N/A 02/20/2023   Procedure: LOOP RECORDER INSERTION;  Surgeon: Fernande Elspeth BROCKS, MD;  Location: [facility] INVASIVE CV LAB;  Service: Cardiovascular;  Laterality: N/A;   POLYPECTOMY  07/15/2017   Procedure: POLYPECTOMY;  Surgeon: Golda Claudis PENNER, MD;  Location: [facility];  Service: Endoscopy;;  colon   PROSTATE SURGERY     RADIOLOGY WITH ANESTHESIA Left 02/14/2023   Procedure: IR WITH ANESTHESIA;  Surgeon: Radiologist, Medication, MD;  Location: [facility] OR;  Service: Radiology;  Laterality: Left;   RADIOLOGY WITH ANESTHESIA N/A 02/14/2023   Procedure: IR WITH ANESTHESIA;  Surgeon: Radiologist, Medication, MD;  Location: [facility] OR;  Service: Radiology;  Laterality: N/A;   Patient Active Problem List    Diagnosis Date Noted   Cognitive change 03/17/2023   Hyponatremia 03/13/2023   Pulmonary embolus (HCC) 03/05/2023   Acute deep vein thrombosis (DVT) of popliteal vein of left lower extremity (HCC) 03/05/2023   Chronic heart failure with mildly reduced ejection fraction (HFmrEF, 41-49%) (HCC) 03/05/2023   AKI (acute kidney injury) 03/05/2023   Precordial pain 03/03/2023   Shortness of breath 03/03/2023   Benign hypertension 03/03/2023   Heart failure with improved ejection fraction (HFimpEF) (HCC) 03/03/2023   Coping style affecting medical condition 02/27/2023   Acute right PCA stroke (HCC) 02/21/2023   Acute ischemic right PCA stroke (HCC) 02/14/2023   Posterior cerebral artery embolism, right 02/14/2023   Special screening for malignant neoplasms, colon 04/09/2017   Dizziness 03/29/2013   Cerebellar stroke, acute (HCC) 03/29/2013   CHF (congestive heart failure) (HCC) 03/28/2013   Vertigo 03/28/2013    PCP: Shona Norleen PEDLAR, MD  REFERRING PROVIDER: [name], MD  REFERRING DIAG: [phone] (ICD-10-CM) - Acute right PCA stroke (HCC)  THERAPY DIAG:  Left-sided weakness  Difficulty in walking, not elsewhere classified  Other abnormalities of gait and mobility  Rationale for Evaluation and Treatment: Rehabilitation  ONSET DATE: 02/14/2023  SUBJECTIVE:   SUBJECTIVE STATEMENT: No new issues today; referrals are in for OT and ST  Eval:02/14/2023; woke up could  not move; stroke; [name] and then CIR; then had home health and now referred to outpatient; he has an AFO but not wearing; states it does not fit; asked him to bring in next visit.  Had a WC but sent that back as he was not using it.  Steps most challenging thing for him; no handrails for basement steps. States he is supposed to be having a surgery on his neck?  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   Washer and dryer in basement; wood stove  PERTINENT HISTORY: CHF, HTN, gout, stroke PAIN:  Are you having pain? Yes: NPRS scale: 7/10 Pain location: left upper trap and shoulder Pain description: sharp Aggravating factors: turning head to left Relieving factors: lidocaine  patch  PRECAUTIONS: Fall  RED FLAGS: None   WEIGHT BEARING RESTRICTIONS: No  FALLS:  Has patient fallen in last 6 months? Yes. Number of falls 2; at hospital slid out of WC  LIVING ENVIRONMENT: Lives with: lives with their spouse Lives in: House/apartment Stairs: Yes: Internal: 10 steps; none Has following equipment at home: Vannie - 2 wheeled, Grab bars, and Ramped entry  OCCUPATION: retired neurosurgeon  PLOF: Independent with household mobility with device and wife assists with buttons and sometimes with bathing  PATIENT GOALS: to walk like I used to; do steps better  NEXT MD VISIT: next month  OBJECTIVE:  Note: Objective measures were completed at Evaluation unless otherwise noted.  DIAGNOSTIC FINDINGS: I63.531 (ICD-10-CM) - Acute right PCA stroke (HCC)  PATIENT SURVEYS:  LEFS  Extreme difficulty/unable (0), Quite a bit of difficulty (1), Moderate difficulty (2), Little difficulty (3), No difficulty (4) Survey date:    Any of your usual work, housework or school activities   2. Usual hobbies, recreational or sporting activities   3. Getting into/out of the bath   4. Walking between rooms   5. Putting on socks/shoes   6. Squatting    7. Lifting an object, like a bag of groceries from the floor   8. Performing light activities around your home   9. Performing heavy activities around your home   10. Getting into/out of a car   11. Walking 2 blocks   12. Walking 1 mile   13. Going up/down 10 stairs (1 flight)   14. Standing for 1 hour   15.  sitting for 1 hour   16. Running on even ground   17. Running on uneven ground   18. Making  sharp turns while running fast   19. Hopping    20. Rolling over in bed   Score total:  21/80; 26.3%     COGNITION: Overall cognitive status: Within functional limits for tasks assessed     SENSATION:   EDEMA:  None noted  MUSCLE LENGTH: Hamstrings:   POSTURE: flexed trunk   PALPATION:   LOWER EXTREMITY MMT:  MMT Right eval Left eval  Hip flexion 4+ 4  Hip extension    Hip abduction    Hip adduction    Hip internal rotation    Hip external rotation    Knee flexion    Knee extension 5 4+  Ankle dorsiflexion 5 4  Ankle plantarflexion    Ankle inversion    Ankle eversion     (Blank rows = not tested)  FUNCTIONAL TESTS:  5 times sit to stand: 1.02.43 sec using hands to push up to standing 2 minute walk test: 125 ft with rollator SLS right 7 and left 4  GAIT: Distance walked: 125 ft Assistive device utilized: Walker - 4 wheeled Level of assistance: Modified independence Comments: slight toe drop left; decreased gait speed                                                                                                                                TREATMENT DATE:  12/28/23 Nustep seat 11 x 5' level 3 dynamic warm up Sit to stand with blue foam in chair x 10 no UE assist Sit to stand no UE assist x 10 Standing PWR twist x 10 Standing PWR rock reaching to numbers on wall Standing PWR twist reaching numbers on wall Standing marching with 4# on ankles 2 x 10 Standing hip extension 4# 2 x 10 Standing hip abduction 4# 2 x 10    12/21/23 Nustep seat 11 x 5' level 3 dynamic warm up Sit to stand with blue foam in chair no UE assist x 10 Sit to stand no foam with 1 UE assist x 5 Sit to stand no foam with no UE assist but needs min A to boost up to standing x 5 Heel raises on incline with 1 UE assist 2 x 10 Toe raises on decline with 2 UE assist 2 x 10 3# marching 2 x10 with 1 UE assist 3# hip abduction 2 x 10 with 2 UE assist 3# hip extension 2 x 10  with 2 UE assist   12/14/2023 Nustep seat 11 x 5' level 3 dynamic warm up 1 hand heel raises on incline x 20 1 hand toe raises on decline x 20 Sit to stand with foam in chair 2 x 5 no UE assist Seated left upper trap stretch x 5 Cervical retractions x 10 Stagger stance x 30 each Tandem stance x 30 each 2# marching with 1 UE assist x 10 then no UE assist x 10 2# sidestepping down and back // bars x 1   12/09/23  Standing in parallel bars with bil UE assist (unless stated):  Heel raises 20X  6 alternating box taps with 1 UE assist  6 forward step ups 1 UE 20X  4 forward lunges with 1 UE assist Sit to stand 2x5 with foam in seat, no UE assist Sit to supine, supine to sit supervision and cues (due to having handbar at home) Supine:  bridge with 5 holds 10X2  SLR 10X each with core stabilization Nustep seat 11 x 5' EOS level 3 UE/LE   12/07/23  Review of HEP and goals Sit to stand x 5 with UE assist Sit to stand x 5 with no UE assist; min A to boost up to standing Standing  Bilateral UE assist heel toe raises x 10 SLS right 7 and left 4 4 box  toe taps 2 x 10; 1st set with bilateral UE assist; 2nd set with 1 UE assist 4 box step ups 2 x 10 with 1 UE assist Seated on corner of mat table PWR up x 8 PWR rock x 8 PWR twist x 8 Nustep seat 11 x 5' to end treatment level 2   12/03/2023 physical therapy evaluation and HEP instruction    PATIENT EDUCATION:  Education details: Patient educated on exam findings, POC, scope of PT, HEP, and what to expect next visit. Person educated: Patient Education method: Explanation, Demonstration, and Handouts Education comprehension: verbalized understanding, returned demonstration, verbal cues required, and tactile cues required  HOME EXERCISE PROGRAM: Access Code: [email or web address] URL: [email or web address] Date: 12/03/2023 Prepared by: [facility]  Exercises - Sit to Stand with Armchair  - 2 x daily - 7 x weekly - 1  sets - 5 reps - Standing Ankle Plantar Flexion Dorsiflexion with Counter Support  - 2 x daily - 7 x weekly - 2 sets - 10 reps  ASSESSMENT:  CLINICAL IMPRESSION: Able to perform sit to stand without foam in chair with only 2 reps requiring min A from PT; still needs cues for nose over toes to promote weight shift forward to improve smoothness of transfer.  PWR moves today to work on trunk rotation and weight shifts/reaching.  He still demonstrates left side weakness and needs AD for safety with ambulation; neck flexed to the left.  Works hard in [employer].    Patient will continue to benefit from continued skilled therapy services to address deficits and promote return to optimal function.     Eval:Patient is a 73 y.o. male who was seen today for physical therapy evaluation and treatment for I63.531 (ICD-10-CM) - Acute right PCA stroke (HCC). Patient demonstrates decreased strength, balance deficits and gait abnormalities which are negatively impacting patient ability to perform ADLs and functional mobility tasks. Patient will benefit from skilled physical therapy services to address these deficits to improve level of function with ADLs, functional mobility tasks, and reduce risk for falls.    OBJECTIVE IMPAIRMENTS: Abnormal gait, increased fascial restrictions, impaired perceived functional ability, and pain.   ACTIVITY LIMITATIONS: carrying, lifting, bending, standing, squatting, stairs, and locomotion level  PARTICIPATION LIMITATIONS: meal prep, cleaning, laundry, driving, shopping, and occupation  REHAB POTENTIAL: Good  CLINICAL DECISION MAKING: Evolving/moderate complexity  EVALUATION COMPLEXITY: Moderate   GOALS: Goals reviewed with patient? No  SHORT TERM GOALS: Target date: 12/24/2023 patient will be independent with initial HEP and compliant with HEP 3-4 times a week   Baseline: Goal status: in progress  2.  Patient will report 30% improvement overall  Baseline:  Goal status:  In progress   LONG TERM GOALS: Target date: 01/14/2024  Patient will be independent in self management strategies to improve quality of life and functional outcomes.  Baseline:  Goal status: in progress  2.  Patient will report 50% improvement overall  Baseline:  Goal status: in progress  3.  Patient will improve LEFS score by 9 points to demonstrate improved perceived function  Baseline: 21/80 Goal status: in progress   4.  Patient will increase distance on 2 MWT to 225 ft or more with LRAD to demonstrate improved speed and efficiency with household and community ambulation  Baseline: 125 fit with rollator Goal status: in progress  5.  Patient will improve 5 times sit to stand score to 30 sec or less to demonstrate improved functional mobility and increased leg strength.  Baseline: 1 min  and 2.45 sec Goal status: in progress    PLAN:  PT FREQUENCY: 1-2x/week  PT DURATION: 6 weeks  PLANNED INTERVENTIONS: 97164- PT Re-evaluation, 97110-Therapeutic exercises, 97530- Therapeutic activity, [id number]- Neuromuscular re-education, 97535- Self Care, [id number]- Manual therapy, [id number]- Gait training, [phone]- Orthotic Fit/training, [phone]- Canalith repositioning, [id number]- Aquatic Therapy, [phone]- Splinting, [phone]- Wound care (first 20 sq cm), 97598- Wound care (each additional 20 sq cm)Patient/Family education, Balance training, Stair training, Taping, Dry Needling, Joint mobilization, Joint manipulation, Spinal manipulation, Spinal mobilization, Scar mobilization, and DME instructions.   PLAN FOR NEXT SESSION:  Progress LE functional strength and stabilization; balance and gait     9:49 AM, 12/28/2023 Shadi Sessler Small Kaiyla Stahly MPT [facility] [place] [phone] [phone]

## 2023-12-29 ENCOUNTER — Encounter (HOSPITAL_COMMUNITY): Payer: Self-pay | Admitting: Occupational Therapy

## 2023-12-29 ENCOUNTER — Ambulatory Visit (HOSPITAL_COMMUNITY): Admitting: Occupational Therapy

## 2023-12-29 DIAGNOSIS — R278 Other lack of coordination: Secondary | ICD-10-CM

## 2023-12-29 DIAGNOSIS — R29818 Other symptoms and signs involving the nervous system: Secondary | ICD-10-CM

## 2023-12-29 DIAGNOSIS — R531 Weakness: Secondary | ICD-10-CM | POA: Diagnosis not present

## 2023-12-29 NOTE — Patient Instructions (Signed)

## 2023-12-29 NOTE — Therapy (Signed)
 OUTPATIENT OCCUPATIONAL THERAPY NEURO EVALUATION  Patient Name: Edward Rodgers MRN: [id number] DOB:[birth year], 73 y.o., male Today's Date: 12/29/2023    END OF SESSION:  OT End of Session - 12/29/23 1512     Visit Number 1    Number of Visits 8    Date for Recertification  01/28/24    Authorization Type [employer] Medicare    Authorization Time Period requesting auth    Progress Note Due on Visit 10    OT Start Time 1109    OT Stop Time 1145    OT Time Calculation (min) 36 min    Activity Tolerance Patient tolerated treatment well    Behavior During Therapy [facility] for tasks assessed/performed          Past Medical History:  Diagnosis Date   Cancer (HCC) 2002   prostate   CHF (congestive heart failure) (HCC)    Gout    Hypertension    Stroke (HCC)    Thyroid  disease    Past Surgical History:  Procedure Laterality Date   COLONOSCOPY N/A 07/15/2017   Procedure: COLONOSCOPY;  Surgeon: Golda Claudis PENNER, MD;  [facility]: [facility];  Service: Endoscopy;  Laterality: N/A;  830   IR CT HEAD LTD  02/14/2023   IR PERCUTANEOUS ART THROMBECTOMY/INFUSION INTRACRANIAL INC DIAG ANGIO  02/14/2023   LOOP RECORDER INSERTION N/A 02/20/2023   Procedure: LOOP RECORDER INSERTION;  Surgeon: Fernande Elspeth BROCKS, MD;  Location: [facility] INVASIVE CV LAB;  Service: Cardiovascular;  Laterality: N/A;   POLYPECTOMY  07/15/2017   Procedure: POLYPECTOMY;  Surgeon: Golda Claudis PENNER, MD;  Location: [facility];  Service: Endoscopy;;  colon   PROSTATE SURGERY     RADIOLOGY WITH ANESTHESIA Left 02/14/2023   Procedure: IR WITH ANESTHESIA;  Surgeon: Radiologist, Medication, MD;  Location: [facility] OR;  Service: Radiology;  Laterality: Left;   RADIOLOGY WITH ANESTHESIA N/A 02/14/2023   Procedure: IR WITH ANESTHESIA;  Surgeon: Radiologist, Medication, MD;  Location: [facility] OR;  Service: Radiology;  Laterality: N/A;   Patient Active Problem List   Diagnosis Date Noted   Cognitive change 03/17/2023   Hyponatremia 03/13/2023   Pulmonary  embolus (HCC) 03/05/2023   Acute deep vein thrombosis (DVT) of popliteal vein of left lower extremity (HCC) 03/05/2023   Chronic heart failure with mildly reduced ejection fraction (HFmrEF, 41-49%) (HCC) 03/05/2023   AKI (acute kidney injury) 03/05/2023   Precordial pain 03/03/2023   Shortness of breath 03/03/2023   Benign hypertension 03/03/2023   Heart failure with improved ejection fraction (HFimpEF) (HCC) 03/03/2023   Coping style affecting medical condition 02/27/2023   Acute right PCA stroke (HCC) 02/21/2023   Acute ischemic right PCA stroke (HCC) 02/14/2023   Posterior cerebral artery embolism, right 02/14/2023   Special screening for malignant neoplasms, colon 04/09/2017   Dizziness 03/29/2013   Cerebellar stroke, acute (HCC) 03/29/2013   CHF (congestive heart failure) (HCC) 03/28/2013   Vertigo 03/28/2013   PCP: Dr. Norleen Hurst  REFERRING PROVIDER: Dr. Murray Collier  ONSET DATE: 02/2023  REFERRING DIAG: P36.468 (ICD-10-CM) - Acute right PCA stroke (HCC)   THERAPY DIAG:  Other symptoms and signs involving the nervous system  Other lack of coordination  Rationale for Evaluation and Treatment: Rehabilitation  SUBJECTIVE:   SUBJECTIVE STATEMENT: S: I have difficulty with my left hand, I can't get it straightened out Pt accompanied by: self  PERTINENT HISTORY: 02/14/2023; woke up could not move; stroke; [name] and then CIR; then had home health and now referred  to outpatient; he has an AFO but not wearing; states it does not fit. Had a WC but sent that back as he was not using it.  Steps most challenging thing for him; no handrails for basement steps. States he is supposed to be having a surgery on his neck.  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  Has washer and dryer in basement; wood stove.    PRECAUTIONS: Fall  WEIGHT BEARING RESTRICTIONS: No  PAIN:  Are you having pain? No  FALLS: Has patient fallen in last 6 months? No  PLOF: Independent  PATIENT GOALS: To be able to use the left hand to help with things.   OBJECTIVE:  Note: Objective measures were completed at Evaluation unless otherwise noted.  HAND DOMINANCE: Right  ADLs: Overall ADLs: Pt reports difficulty with holding items in the left hand, drops a lot of things. Pt reports he needs assistance with tying shoes, operating buttons. Pt has difficulty with cutting food when trying to hold the fork in his left hand.   FUNCTIONAL OUTCOME MEASURES: Quick Dash:  QUICK DASH  Please rate your ability do the following activities in the last week by selecting the number below the appropriate response.   Activities Rating  Open a tight or new jar.  5 = Unable  Do heavy household chores (e.g., wash walls, floors). 5 = Unable  Carry a shopping bag or briefcase 4 = Severe difficulty  Wash your back. 5 = Unable  Use a knife to cut food. 2 = Mild difficulty  Recreational activities in which you take some force or impact through your arm, shoulder or hand (e.g., golf, hammering, tennis, etc.). 5 = Unable  During the past week, to what extent has your arm, shoulder or hand problem interfered with your normal social activities with family, friends, neighbors or groups?  2 = Slightly  During the past week, were you limited in your work or other regular daily activities as a result of your arm, shoulder or hand problem? 3 = Moderately limited  Rate the severity of the following symptoms in the last week: Arm, Shoulder, or hand pain. 4 = Severe  Rate the severity of the following symptoms in the last week: Tingling (pins and needles) in your arm, shoulder or hand. 2 = Mild  During the past week, how much difficulty have you had sleeping because of the pain in your arm, shoulder or hand?  2 = Mild difficulty   (A QuickDASH  score may not be calculated if there is greater than 1 missing item.)  Quick Dash Disability/Symptom Score: [(sum of 39 (n) responses/11 (n)] -1 x 25 = 63.63  Minimally Clinically Important Difference (MCID): 15-20 points  (Franchignoni, F. et al. (2013). Minimally clinically important difference of the disabilities of the arm, shoulder, and hand outcome measures (DASH) and its shortened version (Quick DASH). Journal of Orthopaedic & Sports Physical Therapy, 44(1), 30-39)    UPPER EXTREMITY ROM:    Active ROM Left eval  Shoulder flexion 92  Shoulder abduction 95  Shoulder internal rotation 90  Shoulder external rotation 34  Wrist flexion WFL  Wrist extension WFL  Wrist ulnar deviation WFL  Wrist radial deviation WFL  Wrist pronation WFL  Wrist supination WFL  (Blank rows = not tested)  UPPER EXTREMITY MMT:  MMT Left eval  Shoulder flexion 4/5  Shoulder abduction 4/5  Shoulder internal rotation 5/5  Shoulder external rotation 4-/5  Elbow flexion 5/5  Elbow extension 4+/5  Wrist flexion 5/5  Wrist extension 5/5  Wrist ulnar deviation 5/5  Wrist radial deviation 5/5  Wrist pronation 5/5  Wrist supination 5/5  (Blank rows = not tested)  HAND FUNCTION: Grip strength: Right: 65 lbs; Left: 50 lbs, Lateral pinch: Right: 19 lbs, Left: 15 lbs, and 3 point pinch: Right: 12 lbs, Left: 12 lbs  COORDINATION: 9 Hole Peg test: Right: 33.9 sec; Left: 1'53 sec  SENSATION: [place] Speed: 2.83-3.22=normal to decreased light touch sensation  EDEMA: None  COGNITION: Overall cognitive status: Within functional limits for tasks assessed  VISION: Subjective report: residual double vision Baseline vision: Wears glasses all the time Visual history: glaucoma, cataracts, and had cataract surgery ~2006                                                                                                                             TREATMENT DATE:  -Eval -Educated on HEP-towel  crumple, finger taps, opposition, digit abduction/adduction -Extensive education on motor planning and coordination difficulties, strategies to target    PATIENT EDUCATION: Education details: finger A/ROM Person educated: Patient Education method: Programmer, Multimedia, Facilities Manager, and Handouts Education comprehension: verbalized understanding and returned demonstration  HOME EXERCISE PROGRAM: Eval: finger A/ROM   GOALS: Goals reviewed with patient? Yes  SHORT TERM GOALS: Target date: 01/29/24  Pt will be provided with and educated on HEP to improve ability to use left hand during ADLs and housework tasks.   Goal status: INITIAL  2.  Pt will be educated on strategies to improve ability to hug people and remove arm without brushing against people inappropriately.   Goal status: INITIAL  3.  Pt will improve fine motor coordination in LUE required for operating buttons and manipulating small objects by completing 9 hole peg test in under 1'.   Goal status: INITIAL  4.  Pt will increase left hand grip strength by 8# to improve ability to grasp and move items such as a backpack or grocery bag.   Goal status: INITIAL  5.  Pt will increase LUE A/ROM by 20 degrees or greater to improve ability to perform functional reaching required for bathing, dressing, and reaching behind back.   Goal status: INITIAL  6.  Pt will increase LUE strength for shoulder flexion and abduction to 4+/5 or greater to improve ability to perform functional reaching tasks above shoulder height.   Goal status: INITIAL  7.  Pt will improve bilateral coordination and motor planning required for operating utensils by reporting independence with cutting daily foods.   Goal status: INITIAL  ASSESSMENT:  CLINICAL IMPRESSION: Patient is a 73 y.o. male who was seen today for occupational therapy evaluation s/p CVA in 02/2023. Pt presents with decreased LUE ROM, strength, coordination, and motor planning, impacting his  ability to complete ADLs independently.  Pt reports difficulty with motor planning required for moving his arm when performing functional tasks like reaching, hugging people, as well as fine motor difficulties with self-feeding and dressing. Pt educated on HEP to begin today. Will benefit from skilled OT services to increase independence in ADLs and using LUE during functional tasks.    PERFORMANCE DEFICITS: in functional skills including ADLs, IADLs, coordination, dexterity, tone, ROM, strength, and UE functional use  IMPAIRMENTS: are limiting patient from ADLs, IADLs, rest and sleep, and leisure.   CO-MORBIDITIES: has no other co-morbidities that affects occupational performance. Patient will benefit from skilled OT to address above impairments and improve overall function.  MODIFICATION OR ASSISTANCE TO COMPLETE EVALUATION: No modification of tasks or assist necessary to complete an evaluation.  OT OCCUPATIONAL PROFILE AND HISTORY: Problem focused assessment: Including review of records relating to presenting problem.  CLINICAL DECISION MAKING: LOW - limited treatment options, no task modification necessary  REHAB POTENTIAL: Good  EVALUATION COMPLEXITY: Low    PLAN:  OT FREQUENCY: 2x/week  OT DURATION: 4 weeks  PLANNED INTERVENTIONS: 97168 OT Re-evaluation, 97535 self care/ADL training, 02889 therapeutic exercise, 97530 therapeutic activity, 97112 neuromuscular re-education, 97140 manual therapy, patient/family education, and DME and/or AE instructions  RECOMMENDED OTHER SERVICES: None at this time  CONSULTED AND AGREED WITH PLAN OF CARE: Patient  PLAN FOR NEXT SESSION: Follow up on HEP, initiate motor planning, coordination, and strengthening work   Sonny Cory, OTR/L  [phone] 12/29/2023, 3:33 PM    [facility] Medicare Auth Request Information Treatment Start Date: 12/29/23  Date of referral: 12/21/23 Referring provider: Dr. Murray Collier Referring diagnosis (ICD  10)? P36.468 (ICD-10-CM) - Acute right PCA stroke (HCC)  Treatment diagnosis (ICD 10)? (if different than referring diagnosis) R29.818, R27.8  What was this (referring dx) caused by? Other: CVA  Lysle of Condition: Chronic (continuous duration > 3 months)   Laterality: Lt  Current Functional Measure Score: DASH 63.63  Objective measurements identify impairments when they are compared to normal values, the uninvolved extremity, and prior level of function.  [x]  Yes  []  No  Objective assessment of functional ability: Moderate functional limitations   Briefly describe symptoms: decreased strength, coordination, and functional use of the LUE. Difficulty with motor planning. Decreased independence in ADLs  How did symptoms start: stroke/CVA  Average pain intensity:  Last 24 hours: 0/10  Past week: 0/10  How often does the pt experience symptoms? Constantly  How much have the symptoms interfered with usual daily activities? Moderately  How has condition changed since care began at this facility? NA - initial visit  In general, how is the patients overall health? Good   BACK PAIN (STarT Back Screening Tool) No

## 2023-12-30 ENCOUNTER — Encounter (HOSPITAL_COMMUNITY): Payer: Self-pay

## 2023-12-30 ENCOUNTER — Encounter (HOSPITAL_COMMUNITY): Payer: Self-pay | Admitting: Speech Pathology

## 2023-12-30 ENCOUNTER — Other Ambulatory Visit: Payer: Self-pay

## 2023-12-30 ENCOUNTER — Ambulatory Visit (HOSPITAL_COMMUNITY): Admitting: Speech Pathology

## 2023-12-30 ENCOUNTER — Ambulatory Visit (HOSPITAL_COMMUNITY)

## 2023-12-30 DIAGNOSIS — R1312 Dysphagia, oropharyngeal phase: Secondary | ICD-10-CM

## 2023-12-30 DIAGNOSIS — R41841 Cognitive communication deficit: Secondary | ICD-10-CM

## 2023-12-30 DIAGNOSIS — R2689 Other abnormalities of gait and mobility: Secondary | ICD-10-CM

## 2023-12-30 DIAGNOSIS — R262 Difficulty in walking, not elsewhere classified: Secondary | ICD-10-CM

## 2023-12-30 DIAGNOSIS — R531 Weakness: Secondary | ICD-10-CM | POA: Diagnosis not present

## 2023-12-30 NOTE — Therapy (Signed)
 OUTPATIENT SPEECH LANGUAGE PATHOLOGY EVALUATION   Patient Name: Edward Rodgers MRN: [id number] DOB:10-18-1950, 73 y.o., male Today's Date: 12/30/2023  PCP: Shona Norleen PEDLAR, MD REFERRING PROVIDER: [name], MD  END OF SESSION:  End of Session - 12/30/23 0949     Visit Number 1    Number of Visits 1    Authorization Type [employer] Medicare    SLP Start Time 0900    SLP Stop Time  0945    SLP Time Calculation (min) 45 min    Activity Tolerance Patient tolerated treatment well          Past Medical History:  Diagnosis Date   Cancer (HCC) 2002   prostate   CHF (congestive heart failure) (HCC)    Gout    Hypertension    Stroke (HCC)    Thyroid  disease    Past Surgical History:  Procedure Laterality Date   COLONOSCOPY N/A 07/15/2017   Procedure: COLONOSCOPY;  Surgeon: Golda Claudis PENNER, MD;  Location: [facility];  Service: Endoscopy;  Laterality: N/A;  830   IR CT HEAD LTD  02/14/2023   IR PERCUTANEOUS ART THROMBECTOMY/INFUSION INTRACRANIAL INC DIAG ANGIO  02/14/2023   LOOP RECORDER INSERTION N/A 02/20/2023   Procedure: LOOP RECORDER INSERTION;  Surgeon: Fernande Elspeth BROCKS, MD;  Location: [facility] INVASIVE CV LAB;  Service: Cardiovascular;  Laterality: N/A;   POLYPECTOMY  07/15/2017   Procedure: POLYPECTOMY;  Surgeon: Golda Claudis PENNER, MD;  Location: [facility];  Service: Endoscopy;;  colon   PROSTATE SURGERY     RADIOLOGY WITH ANESTHESIA Left 02/14/2023   Procedure: IR WITH ANESTHESIA;  Surgeon: Radiologist, Medication, MD;  Location: [facility] OR;  Service: Radiology;  Laterality: Left;   RADIOLOGY WITH ANESTHESIA N/A 02/14/2023   Procedure: IR WITH ANESTHESIA;  Surgeon: Radiologist, Medication, MD;  Location: [facility] OR;  Service: Radiology;  Laterality: N/A;   Patient Active Problem List   Diagnosis Date Noted   Cognitive change 03/17/2023   Hyponatremia 03/13/2023   Pulmonary embolus (HCC) 03/05/2023   Acute deep vein thrombosis (DVT) of popliteal vein of left lower extremity (HCC)  03/05/2023   Chronic heart failure with mildly reduced ejection fraction (HFmrEF, 41-49%) (HCC) 03/05/2023   AKI (acute kidney injury) 03/05/2023   Precordial pain 03/03/2023   Shortness of breath 03/03/2023   Benign hypertension 03/03/2023   Heart failure with improved ejection fraction (HFimpEF) (HCC) 03/03/2023   Coping style affecting medical condition 02/27/2023   Acute right PCA stroke (HCC) 02/21/2023   Acute ischemic right PCA stroke (HCC) 02/14/2023   Posterior cerebral artery embolism, right 02/14/2023   Special screening for malignant neoplasms, colon 04/09/2017   Dizziness 03/29/2013   Cerebellar stroke, acute (HCC) 03/29/2013   CHF (congestive heart failure) (HCC) 03/28/2013   Vertigo 03/28/2013    ONSET DATE: 02/14/2023   REFERRING DIAG: P36.468 (ICD-10-CM) - Acute right PCA stroke (HCC)  THERAPY DIAG:  Dysphagia, oropharyngeal phase  Cognitive communication deficit  Rationale for Evaluation and Treatment: Rehabilitation  SUBJECTIVE:   SUBJECTIVE STATEMENT: I still have some trouble with my swallowing.  Pt accompanied by: self  PERTINENT HISTORY: Edward Rodgers is a 73 year old male who was referred for outpatient SLP evaluation and treatment s/p CVA 02/14/2023 (Right PCA scattered infarct with thalamic HTN punctate left PCA infarct status post TNK). Pt was referred by Dr. Albright Urbano and is also receiving outpatient PT and OT. Pt was hospitalized from 02/14/2023- 03/27/2023 and discharged home with home health therapy. He had MBSS  09/07/2023 with results below.   PAIN:  Are you having pain? No  FALLS: Has patient fallen in last 6 months?  No  LIVING ENVIRONMENT: Lives with: lives with their spouse Lives in: House/apartment  PLOF:  Level of assistance: Independent with ADLs, Independent with IADLs Employment: Part-time employment  PATIENT GOALS: Improve movement  OBJECTIVE:  Note: Objective measures were completed at Evaluation unless otherwise  noted.  DIAGNOSTIC FINDINGS: See MRI in imaging section  COGNITION: Overall cognitive status: Within functional limits for tasks assessed Areas of impairment:  N/A Functional deficits: Wife assists with medication management and she handles the finances (baseline). He is driving.   AUDITORY COMPREHENSION: Overall auditory comprehension: Appears intact YES/NO questions: Appears intact Following directions: Appears intact Conversation: Complex Interfering components: N/A Effective technique: N/A  READING COMPREHENSION: Intact  EXPRESSION: verbal  VERBAL EXPRESSION: Level of generative/spontaneous verbalization: conversation Automatic speech: name: intact and social response: intact  Repetition: Appears intact Naming: Responsive: 76-100%, Confrontation: 76-100%, and Divergent: 76-100% Pragmatics: Appears intact Comments: N/A Interfering components: N/A Effective technique: N/A Non-verbal means of communication: N/A  WRITTEN EXPRESSION: Dominant hand: right Written expression: Appears intact  MOTOR SPEECH: Overall motor speech: Appears intact Level of impairment: N/A Respiration: WNL Phonation: normal Resonance: WFL Articulation: Appears intact Intelligibility: Intelligible Motor planning: Appears intact Motor speech errors: N/A Interfering components: N/A Effective technique: N/A  ORAL MOTOR EXAMINATION: Overall status: WFL Comments: Pt does report occasional biting tongue  RECOMMENDATIONS FROM OBJECTIVE SWALLOW STUDY (MBSS/FEES):  MBSS 09/07/2023 Clinical Impression: Pt presents with mild pharyngeal dysphagia characterized by variable delay in swallow trigger, generally at the level of the valleculae, normal hyolaryngeal excursion, tongue base retraction, and epiglottic deflection. Pt with prominent cricopharyngeus and the pill briefly delayed just inferior to this and Pt aspirated a trace amount of thin when attempting to clear the pill (penetrated before the  swallow and aspirated after the swallow) which elicited a cough, but not completely cleared. Pt also with penetration with straw sips thin x1. Esophageal sweep was unremarkable. Recommend regular textures and thin liquids via cup sips (avoid straws and take small sips) and PO medication whole in puree or with water , sit upright for all PO and remain upright for at least 30 minutes after. No further SLP services indicated at this time.   Recommendations: PO diet PO Diet Recommendation: Regular; Thin liquids (Level 0) Liquid Administration via: Cup; Straw Medication Administration: Whole meds with puree Swallowing strategies  : Slow rate; Small bites/sips Postural changes: Position pt fully upright for meals; Stay upright 30-60 min after meals Oral care recommendations: Oral care BID (2x/day)  CLINICAL SWALLOW ASSESSMENT:   Current diet: regular and thin liquids Dentition: adequate natural dentition Patient directly observed with POs: Yes: regular and thin liquids  Feeding: able to feed self Liquids provided by: cup Oral phase signs and symptoms: WNL Pharyngeal phase signs and symptoms: N/A Comments: Pt reports that he still coughs on his saliva and occasionally when drinking liquids. Imaging from MBSS completed in August 2025 was reviewed in session today with Pt and written recommendations provided. See clinical impression at the end of this report.   STANDARDIZED ASSESSMENTS: SLUMS: 28/20 errors for digits in reverse and recalled 4/5 words in delayed recall task (named the word when given category cue)  TREATMENT DATE: 12/30/2023 Evaluation of cognitive communication skills and swallow function completed this date. No additional therapy recommended at this time. See clinical impression statement below.  PATIENT EDUCATION: Education details: Written recommendations for small  sips, no straws, clear throat/cough after taking pills with water , avoid talking during meals, and swallow 2x every minute throughout the day Person educated: Patient Education method: Explanation and Handouts Education comprehension: verbalized understanding   GOALS: N/A; evaluation only  ASSESSMENT:  CLINICAL IMPRESSION: Patient is a 73 y.o. male who was seen today for a cognitive linguistic and clinical swallow evaluation following referral from Dr. Urbano. Pt reports feeling back to his baseline except for physical changes (receiving PT/OT) and is able to communicate complex thoughts and feelings, manage household tasks, and consumes regular textures and thin liquids with occasional coughing with liquids and on his saliva when not eating. He scored a 28/30 on the [facility] with error on 3-digits in reverse and recalled 4/5 words in delayed recall task. He named the missing word when provided with category cue. Imaging from the MBSS completed with this SLP in August, was reviewed with Pt and written recommendations provided. Pt encouraged to swallow 2x/minute throughout the day, avoid talking during meals, and to take small sips/no straws. Pt indicates that he gets choked on his saliva when in bed (encouraged to elevate HOB). MBSS revealed a delay in swallow trigger, but over functional swallow (see above) and can continue to consume regular textures and thin liquids (small sips). Pt consumed thin water  and regular textures in session without incident this date. Pt will be discharged from SLP services at this time. Pt is in agreement with plan of care.   OBJECTIVE IMPAIRMENTS: include dysphagia. These impairments are limiting patient from N/A. Factors affecting potential to achieve goals and functional outcome are N/A.   REHAB POTENTIAL: N/A; evaluation only  PLAN: D/C from SLP services, evaluation only   Thank you,  Lamar Candy, CCC-SLP [phone]  Obediah Welles,  CCC-SLP 12/30/2023, 9:52 AM

## 2023-12-30 NOTE — Therapy (Signed)
 OUTPATIENT PHYSICAL THERAPY LOWER EXTREMITY TREATMENT   Patient Name: Edward Rodgers MRN: [id number] DOB:26-Sep-1950, 73 y.o., male Today's Date: 12/30/2023  END OF SESSION:  PT End of Session - 12/30/23 1026     Visit Number 7    Number of Visits 12    Date for Recertification  01/14/24    Authorization Type [employer]    Authorization Time Period 12 visits approved 11/20-01/14/24    Authorization - Visit Number 7    Authorization - Number of Visits 12    Progress Note Due on Visit 10    PT Start Time 1035    PT Stop Time 1118    PT Time Calculation (min) 43 min    Activity Tolerance Patient tolerated treatment well    Behavior During Therapy [facility] for tasks assessed/performed           Past Medical History:  Diagnosis Date   Cancer (HCC) 2002   prostate   CHF (congestive heart failure) (HCC)    Gout    Hypertension    Stroke (HCC)    Thyroid  disease    Past Surgical History:  Procedure Laterality Date   COLONOSCOPY N/A 07/15/2017   Procedure: COLONOSCOPY;  Surgeon: Golda Claudis PENNER, MD;  Location: [facility];  Service: Endoscopy;  Laterality: N/A;  830   IR CT HEAD LTD  02/14/2023   IR PERCUTANEOUS ART THROMBECTOMY/INFUSION INTRACRANIAL INC DIAG ANGIO  02/14/2023   LOOP RECORDER INSERTION N/A 02/20/2023   Procedure: LOOP RECORDER INSERTION;  Surgeon: Fernande Elspeth BROCKS, MD;  Location: [facility] INVASIVE CV LAB;  Service: Cardiovascular;  Laterality: N/A;   POLYPECTOMY  07/15/2017   Procedure: POLYPECTOMY;  Surgeon: Golda Claudis PENNER, MD;  Location: [facility];  Service: Endoscopy;;  colon   PROSTATE SURGERY     RADIOLOGY WITH ANESTHESIA Left 02/14/2023   Procedure: IR WITH ANESTHESIA;  Surgeon: Radiologist, Medication, MD;  Location: [facility] OR;  Service: Radiology;  Laterality: Left;   RADIOLOGY WITH ANESTHESIA N/A 02/14/2023   Procedure: IR WITH ANESTHESIA;  Surgeon: Radiologist, Medication, MD;  Location: [facility] OR;  Service: Radiology;  Laterality: N/A;   Patient Active Problem List    Diagnosis Date Noted   Cognitive change 03/17/2023   Hyponatremia 03/13/2023   Pulmonary embolus (HCC) 03/05/2023   Acute deep vein thrombosis (DVT) of popliteal vein of left lower extremity (HCC) 03/05/2023   Chronic heart failure with mildly reduced ejection fraction (HFmrEF, 41-49%) (HCC) 03/05/2023   AKI (acute kidney injury) 03/05/2023   Precordial pain 03/03/2023   Shortness of breath 03/03/2023   Benign hypertension 03/03/2023   Heart failure with improved ejection fraction (HFimpEF) (HCC) 03/03/2023   Coping style affecting medical condition 02/27/2023   Acute right PCA stroke (HCC) 02/21/2023   Acute ischemic right PCA stroke (HCC) 02/14/2023   Posterior cerebral artery embolism, right 02/14/2023   Special screening for malignant neoplasms, colon 04/09/2017   Dizziness 03/29/2013   Cerebellar stroke, acute (HCC) 03/29/2013   CHF (congestive heart failure) (HCC) 03/28/2013   Vertigo 03/28/2013    PCP: Shona Norleen PEDLAR, MD  REFERRING PROVIDER: [name], MD  REFERRING DIAG: [phone] (ICD-10-CM) - Acute right PCA stroke (HCC)  THERAPY DIAG:  Left-sided weakness  Difficulty in walking, not elsewhere classified  Other abnormalities of gait and mobility  Rationale for Evaluation and Treatment: Rehabilitation  ONSET DATE: 02/14/2023  SUBJECTIVE:   SUBJECTIVE STATEMENT: No new issues today; referrals are in for OT and ST  Eval:02/14/2023; woke up could  not move; stroke; [name] and then CIR; then had home health and now referred to outpatient; he has an AFO but not wearing; states it does not fit; asked him to bring in next visit.  Had a WC but sent that back as he was not using it.  Steps most challenging thing for him; no handrails for basement steps. States he is supposed to be having a surgery on his neck?  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   Washer and dryer in basement; wood stove  PERTINENT HISTORY: CHF, HTN, gout, stroke PAIN:  Are you having pain? Yes: NPRS scale: 7/10 Pain location: left upper trap and shoulder Pain description: sharp Aggravating factors: turning head to left Relieving factors: lidocaine  patch  PRECAUTIONS: Fall  RED FLAGS: None   WEIGHT BEARING RESTRICTIONS: No  FALLS:  Has patient fallen in last 6 months? Yes. Number of falls 2; at hospital slid out of WC  LIVING ENVIRONMENT: Lives with: lives with their spouse Lives in: House/apartment Stairs: Yes: Internal: 10 steps; none Has following equipment at home: Vannie - 2 wheeled, Grab bars, and Ramped entry  OCCUPATION: retired neurosurgeon  PLOF: Independent with household mobility with device and wife assists with buttons and sometimes with bathing  PATIENT GOALS: to walk like I used to; do steps better  NEXT MD VISIT: next month  OBJECTIVE:  Note: Objective measures were completed at Evaluation unless otherwise noted.  DIAGNOSTIC FINDINGS: I63.531 (ICD-10-CM) - Acute right PCA stroke (HCC)  PATIENT SURVEYS:  LEFS  Extreme difficulty/unable (0), Quite a bit of difficulty (1), Moderate difficulty (2), Little difficulty (3), No difficulty (4) Survey date:    Any of your usual work, housework or school activities   2. Usual hobbies, recreational or sporting activities   3. Getting into/out of the bath   4. Walking between rooms   5. Putting on socks/shoes   6. Squatting    7. Lifting an object, like a bag of groceries from the floor   8. Performing light activities around your home   9. Performing heavy activities around your home   10. Getting into/out of a car   11. Walking 2 blocks   12. Walking 1 mile   13. Going up/down 10 stairs (1 flight)   14. Standing for 1 hour   15.  sitting for 1 hour   16. Running on even ground   17. Running on uneven ground   18. Making  sharp turns while running fast   19. Hopping    20. Rolling over in bed   Score total:  21/80; 26.3%     COGNITION: Overall cognitive status: Within functional limits for tasks assessed     SENSATION:   EDEMA:  None noted  MUSCLE LENGTH: Hamstrings:   POSTURE: flexed trunk   PALPATION:   LOWER EXTREMITY MMT:  MMT Right eval Left eval  Hip flexion 4+ 4  Hip extension    Hip abduction    Hip adduction    Hip internal rotation    Hip external rotation    Knee flexion    Knee extension 5 4+  Ankle dorsiflexion 5 4  Ankle plantarflexion    Ankle inversion    Ankle eversion     (Blank rows = not tested)  FUNCTIONAL TESTS:  5 times sit to stand: 1.02.43 sec using hands to push up to standing 2 minute walk test: 125 ft with rollator SLS right 7 and left 4  GAIT: Distance walked: 125 ft Assistive device utilized: Walker - 4 wheeled Level of assistance: Modified independence Comments: slight toe drop left; decreased gait speed                                                                                                                                TREATMENT DATE:  12/30/23: Nustep seat 11 x 5' level 3 dynamic warm up Standing inside // bars Toe tapping 6in step height 3 x 10 (2HHA, 1HHA, then none) Tandem stance with Lt foot on floor, Rt foot on top of 6in step Rockerboard R/L then Df/Pf x 2 min each Heel raises 10x 2 Toe raises 10x 2 Sidestep with RTB around thigh 3RT inside // bars STS no HHA, cueing for nose over toes, required multiple attempts but able to complete from standard height.  12/28/23 Nustep seat 11 x 5' level 3 dynamic warm up Sit to stand with blue foam in chair x 10 no UE assist Sit to stand no UE assist x 10 Standing PWR twist x 10 Standing PWR rock reaching to numbers on wall Standing PWR twist reaching numbers on wall Standing marching with 4# on ankles 2 x 10 Standing hip extension 4# 2 x 10 Standing hip abduction 4# 2 x  10    12/21/23 Nustep seat 11 x 5' level 3 dynamic warm up Sit to stand with blue foam in chair no UE assist x 10 Sit to stand no foam with 1 UE assist x 5 Sit to stand no foam with no UE assist but needs min A to boost up to standing x 5 Heel raises on incline with 1 UE assist 2 x 10 Toe raises on decline with 2 UE assist 2 x 10 3# marching 2 x10 with 1 UE assist 3# hip abduction 2 x 10 with 2 UE assist 3# hip extension 2 x 10 with 2 UE assist   12/14/2023 Nustep seat 11 x 5' level 3 dynamic warm up 1 hand heel raises on incline x 20 1 hand toe raises on decline x 20 Sit to stand with foam in chair 2 x 5 no UE assist Seated left upper trap stretch x 5 Cervical retractions x 10 Stagger stance x 30 each Tandem stance x 30 each 2# marching with 1 UE assist x 10 then no UE assist x 10 2# sidestepping down and back // bars x 1   12/09/23  Standing in parallel bars with bil UE assist (unless stated):  Heel raises 20X  6 alternating box taps with 1 UE assist  6 forward step ups 1 UE 20X  4 forward lunges with 1 UE assist Sit to stand 2x5 with foam in seat, no UE assist Sit to supine, supine  to sit supervision and cues (due to having handbar at home) Supine:  bridge with 5 holds 10X2  SLR 10X each with core stabilization Nustep seat 11 x 5' EOS level 3 UE/LE   12/07/23  Review of HEP and goals Sit to stand x 5 with UE assist Sit to stand x 5 with no UE assist; min A to boost up to standing Standing  Bilateral UE assist heel toe raises x 10 SLS right 7 and left 4 4 box toe taps 2 x 10; 1st set with bilateral UE assist; 2nd set with 1 UE assist 4 box step ups 2 x 10 with 1 UE assist Seated on corner of mat table PWR up x 8 PWR rock x 8 PWR twist x 8 Nustep seat 11 x 5' to end treatment level 2   12/03/2023 physical therapy evaluation and HEP instruction    PATIENT EDUCATION:  Education details: Patient educated on exam findings, POC, scope of PT, HEP, and  what to expect next visit. Person educated: Patient Education method: Explanation, Demonstration, and Handouts Education comprehension: verbalized understanding, returned demonstration, verbal cues required, and tactile cues required  HOME EXERCISE PROGRAM: Access Code: [email or web address] URL: [email or web address] Date: 12/03/2023 Prepared by: [facility] - Rehab  Exercises - Sit to Stand with Armchair  - 2 x daily - 7 x weekly - 1 sets - 5 reps - Standing Ankle Plantar Flexion Dorsiflexion with Counter Support  - 2 x daily - 7 x weekly - 2 sets - 10 reps  ASSESSMENT:  CLINICAL IMPRESSION: Session focus with LE strengthening and balance training.  Added gait associated exercises to improve heel to toe mechanics and emphasized good mechanics with gait following therapy.  Pt able to complete STS from standard chair height, required cueing for nose over toes to promote forward weight shifting, required multiple attempts due to weakness.  Reviewed current HEP, pt admits he hasn't been as compliant due to memory, reviewed strategies to assist with reminders to increase compliance at home for maximal benefits.  Pt able to complete whole session with no reports of pain and minimal fatigue, no rest breaks required this session.  Eval:Patient is a 73 y.o. male who was seen today for physical therapy evaluation and treatment for I63.531 (ICD-10-CM) - Acute right PCA stroke (HCC). Patient demonstrates decreased strength, balance deficits and gait abnormalities which are negatively impacting patient ability to perform ADLs and functional mobility tasks. Patient will benefit from skilled physical therapy services to address these deficits to improve level of function with ADLs, functional mobility tasks, and reduce risk for falls.    OBJECTIVE IMPAIRMENTS: Abnormal gait, increased fascial restrictions, impaired perceived functional ability, and pain.   ACTIVITY LIMITATIONS: carrying, lifting, bending,  standing, squatting, stairs, and locomotion level  PARTICIPATION LIMITATIONS: meal prep, cleaning, laundry, driving, shopping, and occupation  REHAB POTENTIAL: Good  CLINICAL DECISION MAKING: Evolving/moderate complexity  EVALUATION COMPLEXITY: Moderate   GOALS: Goals reviewed with patient? No  SHORT TERM GOALS: Target date: 12/24/2023 patient will be independent with initial HEP and compliant with HEP 3-4 times a week   Baseline: Goal status: in progress  2.  Patient will report 30% improvement overall  Baseline:  Goal status: In progress   LONG TERM GOALS: Target date: 01/14/2024  Patient will be independent in self management strategies to improve quality of life and functional outcomes.  Baseline:  Goal status: in progress  2.  Patient will report 50% improvement overall  Baseline:  Goal status: in progress  3.  Patient will improve LEFS score by 9 points to demonstrate improved perceived function  Baseline: 21/80 Goal status: in progress   4.  Patient will increase distance on 2 MWT to 225 ft or more with LRAD to demonstrate improved speed and efficiency with household and community ambulation  Baseline: 125 fit with rollator Goal status: in progress  5.  Patient will improve 5 times sit to stand score to 30 sec or less to demonstrate improved functional mobility and increased leg strength.  Baseline: 1 min and 2.45 sec Goal status: in progress    PLAN:  PT FREQUENCY: 1-2x/week  PT DURATION: 6 weeks  PLANNED INTERVENTIONS: [id number]- PT Re-evaluation, 97110-Therapeutic exercises, 97530- Therapeutic activity, 97112- Neuromuscular re-education, 97535- Self Care, [id number]- Manual therapy, [id number]- Gait training, [phone]- Orthotic Fit/training, [phone]- Canalith repositioning, [id number]- Aquatic Therapy, [phone]- Splinting, [phone]- Wound care (first 20 sq cm), 97598- Wound care (each additional 20 sq cm)Patient/Family education, Balance training, Stair training, Taping, Dry  Needling, Joint mobilization, Joint manipulation, Spinal manipulation, Spinal mobilization, Scar mobilization, and DME instructions.   PLAN FOR NEXT SESSION:  Progress LE functional strength and stabilization; balance and gait       Augustin Mclean, LPTA/CLT; CBIS [phone]  11:33 AM, 12/30/2023

## 2024-01-01 ENCOUNTER — Ambulatory Visit: Payer: Self-pay | Admitting: Cardiology

## 2024-01-04 ENCOUNTER — Ambulatory Visit (HOSPITAL_COMMUNITY)

## 2024-01-04 DIAGNOSIS — R29818 Other symptoms and signs involving the nervous system: Secondary | ICD-10-CM

## 2024-01-04 DIAGNOSIS — R531 Weakness: Secondary | ICD-10-CM | POA: Diagnosis not present

## 2024-01-04 DIAGNOSIS — R262 Difficulty in walking, not elsewhere classified: Secondary | ICD-10-CM

## 2024-01-04 DIAGNOSIS — R2689 Other abnormalities of gait and mobility: Secondary | ICD-10-CM

## 2024-01-04 DIAGNOSIS — R278 Other lack of coordination: Secondary | ICD-10-CM

## 2024-01-04 NOTE — Therapy (Signed)
 " OUTPATIENT PHYSICAL THERAPY LOWER EXTREMITY TREATMENT   Patient Name: Edward Rodgers MRN: [id number] DOB:02-22-1950, 73 y.o., male Today's Date: 01/04/2024  END OF SESSION:  PT End of Session - 01/04/24 1009     Visit Number 8    Number of Visits 12    Date for Recertification  01/14/24    Authorization Type [employer] Medicare    Authorization Time Period 12 visits approved 11/20-01/14/24    Authorization - Visit Number 8    Authorization - Number of Visits 12    Progress Note Due on Visit 10    PT Start Time 1006   late check in   PT Stop Time 1030    PT Time Calculation (min) 24 min    Activity Tolerance Patient tolerated treatment well    Behavior During Therapy [facility] for tasks assessed/performed           Past Medical History:  Diagnosis Date   Cancer (HCC) 2002   prostate   CHF (congestive heart failure) (HCC)    Gout    Hypertension    Stroke (HCC)    Thyroid  disease    Past Surgical History:  Procedure Laterality Date   COLONOSCOPY N/A 07/15/2017   Procedure: COLONOSCOPY;  Surgeon: Golda Claudis PENNER, MD;  Location: [facility];  Service: Endoscopy;  Laterality: N/A;  830   IR CT HEAD LTD  02/14/2023   IR PERCUTANEOUS ART THROMBECTOMY/INFUSION INTRACRANIAL INC DIAG ANGIO  02/14/2023   LOOP RECORDER INSERTION N/A 02/20/2023   Procedure: LOOP RECORDER INSERTION;  Surgeon: Fernande Elspeth BROCKS, MD;  Location: [facility] INVASIVE CV LAB;  Service: Cardiovascular;  Laterality: N/A;   POLYPECTOMY  07/15/2017   Procedure: POLYPECTOMY;  Surgeon: Golda Claudis PENNER, MD;  Location: [facility];  Service: Endoscopy;;  colon   PROSTATE SURGERY     RADIOLOGY WITH ANESTHESIA Left 02/14/2023   Procedure: IR WITH ANESTHESIA;  Surgeon: Radiologist, Medication, MD;  Location: [facility] OR;  Service: Radiology;  Laterality: Left;   RADIOLOGY WITH ANESTHESIA N/A 02/14/2023   Procedure: IR WITH ANESTHESIA;  Surgeon: Radiologist, Medication, MD;  Location: [facility] OR;  Service: Radiology;  Laterality: N/A;   Patient Active  Problem List   Diagnosis Date Noted   Cognitive change 03/17/2023   Hyponatremia 03/13/2023   Pulmonary embolus (HCC) 03/05/2023   Acute deep vein thrombosis (DVT) of popliteal vein of left lower extremity (HCC) 03/05/2023   Chronic heart failure with mildly reduced ejection fraction (HFmrEF, 41-49%) (HCC) 03/05/2023   AKI (acute kidney injury) 03/05/2023   Precordial pain 03/03/2023   Shortness of breath 03/03/2023   Benign hypertension 03/03/2023   Heart failure with improved ejection fraction (HFimpEF) (HCC) 03/03/2023   Coping style affecting medical condition 02/27/2023   Acute right PCA stroke (HCC) 02/21/2023   Acute ischemic right PCA stroke (HCC) 02/14/2023   Posterior cerebral artery embolism, right 02/14/2023   Special screening for malignant neoplasms, colon 04/09/2017   Dizziness 03/29/2013   Cerebellar stroke, acute (HCC) 03/29/2013   CHF (congestive heart failure) (HCC) 03/28/2013   Vertigo 03/28/2013    PCP: Shona Norleen PEDLAR, MD  REFERRING PROVIDER: [name], MD  REFERRING DIAG: [phone] (ICD-10-CM) - Acute right PCA stroke (HCC)  THERAPY DIAG:  Left-sided weakness  Difficulty in walking, not elsewhere classified  Other abnormalities of gait and mobility  Other symptoms and signs involving the nervous system  Other lack of coordination  Rationale for Evaluation and Treatment: Rehabilitation  ONSET DATE: 02/14/2023  SUBJECTIVE:  SUBJECTIVE STATEMENT: Late arrival today; has had some vertigo the last few days but not currently.  Eval:02/14/2023; woke up could not move; stroke; [name] and then CIR; then had home health and now referred to outpatient; he has an AFO but not wearing; states it does not fit; asked him to bring in next visit.  Had a WC but sent that back as he was not using it.  Steps most challenging thing for him; no handrails for basement steps. States he is supposed to be having a surgery on his neck?  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   Washer and dryer in basement; wood stove  PERTINENT HISTORY: CHF, HTN, gout, stroke PAIN:  Are you having pain? Yes: NPRS scale: 7/10 Pain location: left upper trap and shoulder Pain description: sharp Aggravating factors: turning head to left Relieving factors: lidocaine  patch  PRECAUTIONS: Fall  RED FLAGS: None   WEIGHT BEARING RESTRICTIONS: No  FALLS:  Has patient fallen in last 6 months? Yes. Number of falls 2; at hospital slid out of WC  LIVING ENVIRONMENT: Lives with: lives with their spouse Lives in: House/apartment Stairs: Yes: Internal: 10 steps; none Has following equipment at home: Vannie - 2 wheeled, Grab bars, and Ramped entry  OCCUPATION: retired neurosurgeon  PLOF: Independent with household mobility with device and wife assists with buttons and sometimes with bathing  PATIENT GOALS: to walk like I used to; do steps better  NEXT MD VISIT: next month  OBJECTIVE:  Note: Objective measures were completed at Evaluation unless otherwise noted.  DIAGNOSTIC FINDINGS: I63.531 (ICD-10-CM) - Acute right PCA stroke (HCC)  PATIENT SURVEYS:  LEFS  Extreme difficulty/unable (0), Quite a bit of difficulty (1), Moderate difficulty (2), Little difficulty (3), No difficulty (4) Survey date:    Any of your usual work, housework or school activities   2. Usual hobbies, recreational or sporting activities   3. Getting into/out of the bath   4. Walking between rooms   5. Putting on socks/shoes   6. Squatting    7. Lifting an object, like a bag of groceries from the floor   8. Performing light activities around your home   9. Performing heavy activities around your home   10. Getting into/out of a car   11. Walking 2 blocks   12. Walking 1 mile   13. Going up/down 10 stairs (1 flight)    14. Standing for 1 hour   15.  sitting for 1 hour   16. Running on even ground   17. Running on uneven ground   18. Making sharp turns while running fast   19. Hopping    20. Rolling over in bed   Score total:  21/80; 26.3%     COGNITION: Overall cognitive status: Within functional limits for tasks assessed     SENSATION:   EDEMA:  None noted  MUSCLE LENGTH: Hamstrings:   POSTURE: flexed trunk   PALPATION:   LOWER EXTREMITY MMT:  MMT Right eval Left eval  Hip flexion 4+ 4  Hip extension    Hip abduction    Hip adduction    Hip internal rotation    Hip external rotation    Knee flexion    Knee extension 5 4+  Ankle dorsiflexion 5 4  Ankle plantarflexion  Ankle inversion    Ankle eversion     (Blank rows = not tested)   FUNCTIONAL TESTS:  5 times sit to stand: 1.02.43 sec using hands to push up to standing 2 minute walk test: 125 ft with rollator SLS right 7 and left 4  GAIT: Distance walked: 125 ft Assistive device utilized: Walker - 4 wheeled Level of assistance: Modified independence Comments: slight toe drop left; decreased gait speed                                                                                                                                TREATMENT DATE:  01/04/24 Heel raises in incline x 10 Toe raises on decline x 10 4 box step ups x 10 with 1 UE assist 4 lateral box step ups with left leg leading with 2 UE assist x 10 Sit to stand 2 sets of 5 no UE assist Nustep seat 11 level 3 x 5' to end treatment    12/30/23: Nustep seat 11 x 5' level 3 dynamic warm up Standing inside // bars Toe tapping 6in step height 3 x 10 (2HHA, 1HHA, then none) Tandem stance with Lt foot on floor, Rt foot on top of 6in step Rockerboard R/L then Df/Pf x 2 min each Heel raises 10x 2 Toe raises 10x 2 Sidestep with RTB around thigh 3RT inside // bars STS no HHA, cueing for nose over toes, required multiple attempts but able to  complete from standard height.  12/28/23 Nustep seat 11 x 5' level 3 dynamic warm up Sit to stand with blue foam in chair x 10 no UE assist Sit to stand no UE assist x 10 Standing PWR twist x 10 Standing PWR rock reaching to numbers on wall Standing PWR twist reaching numbers on wall Standing marching with 4# on ankles 2 x 10 Standing hip extension 4# 2 x 10 Standing hip abduction 4# 2 x 10    12/21/23 Nustep seat 11 x 5' level 3 dynamic warm up Sit to stand with blue foam in chair no UE assist x 10 Sit to stand no foam with 1 UE assist x 5 Sit to stand no foam with no UE assist but needs min A to boost up to standing x 5 Heel raises on incline with 1 UE assist 2 x 10 Toe raises on decline with 2 UE assist 2 x 10 3# marching 2 x10 with 1 UE assist 3# hip abduction 2 x 10 with 2 UE assist 3# hip extension 2 x 10 with 2 UE assist   12/14/2023 Nustep seat 11 x 5' level 3 dynamic warm up 1 hand heel raises on incline x 20 1 hand toe raises on decline x 20 Sit to stand with foam in chair 2 x 5 no UE assist Seated left upper trap stretch x 5 Cervical retractions x 10 Stagger stance x 30 each Tandem stance x 30 each 2# marching  with 1 UE assist x 10 then no UE assist x 10 2# sidestepping down and back // bars x 1   12/09/23  Standing in parallel bars with bil UE assist (unless stated):  Heel raises 20X  6 alternating box taps with 1 UE assist  6 forward step ups 1 UE 20X  4 forward lunges with 1 UE assist Sit to stand 2x5 with foam in seat, no UE assist Sit to supine, supine to sit supervision and cues (due to having handbar at home) Supine:  bridge with 5 holds 10X2  SLR 10X each with core stabilization Nustep seat 11 x 5' EOS level 3 UE/LE   12/07/23  Review of HEP and goals Sit to stand x 5 with UE assist Sit to stand x 5 with no UE assist; min A to boost up to standing Standing  Bilateral UE assist heel toe raises x 10 SLS right 7 and left 4 4 box toe  taps 2 x 10; 1st set with bilateral UE assist; 2nd set with 1 UE assist 4 box step ups 2 x 10 with 1 UE assist Seated on corner of mat table PWR up x 8 PWR rock x 8 PWR twist x 8 Nustep seat 11 x 5' to end treatment level 2   12/03/2023 physical therapy evaluation and HEP instruction    PATIENT EDUCATION:  Education details: Patient educated on exam findings, POC, scope of PT, HEP, and what to expect next visit. Person educated: Patient Education method: Explanation, Demonstration, and Handouts Education comprehension: verbalized understanding, returned demonstration, verbal cues required, and tactile cues required  HOME EXERCISE PROGRAM: Access Code: [email or web address] URL: [email or web address] Date: 12/03/2023 Prepared by: [facility]  Exercises - Sit to Stand with Armchair  - 2 x daily - 7 x weekly - 1 sets - 5 reps - Standing Ankle Plantar Flexion Dorsiflexion with Counter Support  - 2 x daily - 7 x weekly - 2 sets - 10 reps  ASSESSMENT:  CLINICAL IMPRESSION: 20 min late for appt today; he still would like to have a treatment. Patient performs sit to stand from standard chair without use of UE 's with extra effort especially the first few reps.  Continues to demonstrate left cervical lateral flexion and noted left functional foot drop although improves with verbal cues.  Continues to use rollator in clinic.   Patient will benefit from continued skilled therapy services to address deficits and promote return to optimal function.      Eval:Patient is a 73 y.o. male who was seen today for physical therapy evaluation and treatment for I63.531 (ICD-10-CM) - Acute right PCA stroke (HCC). Patient demonstrates decreased strength, balance deficits and gait abnormalities which are negatively impacting patient ability to perform ADLs and functional mobility tasks. Patient will benefit from skilled physical therapy services to address these deficits to improve level of function with  ADLs, functional mobility tasks, and reduce risk for falls.    OBJECTIVE IMPAIRMENTS: Abnormal gait, increased fascial restrictions, impaired perceived functional ability, and pain.   ACTIVITY LIMITATIONS: carrying, lifting, bending, standing, squatting, stairs, and locomotion level  PARTICIPATION LIMITATIONS: meal prep, cleaning, laundry, driving, shopping, and occupation  REHAB POTENTIAL: Good  CLINICAL DECISION MAKING: Evolving/moderate complexity  EVALUATION COMPLEXITY: Moderate   GOALS: Goals reviewed with patient? No  SHORT TERM GOALS: Target date: 12/24/2023 patient will be independent with initial HEP and compliant with HEP 3-4 times a week   Baseline: Goal status: in progress  2.  Patient will  report 30% improvement overall  Baseline:  Goal status: In progress   LONG TERM GOALS: Target date: 01/14/2024  Patient will be independent in self management strategies to improve quality of life and functional outcomes.  Baseline:  Goal status: in progress  2.  Patient will report 50% improvement overall  Baseline:  Goal status: in progress  3.  Patient will improve LEFS score by 9 points to demonstrate improved perceived function  Baseline: 21/80 Goal status: in progress   4.  Patient will increase distance on 2 MWT to 225 ft or more with LRAD to demonstrate improved speed and efficiency with household and community ambulation  Baseline: 125 fit with rollator Goal status: in progress  5.  Patient will improve 5 times sit to stand score to 30 sec or less to demonstrate improved functional mobility and increased leg strength.  Baseline: 1 min and 2.45 sec Goal status: in progress    PLAN:  PT FREQUENCY: 1-2x/week  PT DURATION: 6 weeks  PLANNED INTERVENTIONS: 97164- PT Re-evaluation, 97110-Therapeutic exercises, 97530- Therapeutic activity, 97112- Neuromuscular re-education, 97535- Self Care, [id number]- Manual therapy, [id number]- Gait training, [id number]- Orthotic  Fit/training, [phone]- Canalith repositioning, [id number]- Aquatic Therapy, [phone]- Splinting, [phone]- Wound care (first 20 sq cm), 97598- Wound care (each additional 20 sq cm)Patient/Family education, Balance training, Stair training, Taping, Dry Needling, Joint mobilization, Joint manipulation, Spinal manipulation, Spinal mobilization, Scar mobilization, and DME instructions.   PLAN FOR NEXT SESSION:  Progress LE functional strength and stabilization; balance and gait    10:26 AM, 01/04/2024 Allegra Cerniglia Small Jered Heiny MPT [facility] [place] [phone] [phone]   "

## 2024-01-05 ENCOUNTER — Encounter (HOSPITAL_COMMUNITY): Payer: Self-pay | Admitting: Occupational Therapy

## 2024-01-05 ENCOUNTER — Ambulatory Visit (HOSPITAL_COMMUNITY): Admitting: Occupational Therapy

## 2024-01-05 DIAGNOSIS — R278 Other lack of coordination: Secondary | ICD-10-CM

## 2024-01-05 DIAGNOSIS — R531 Weakness: Secondary | ICD-10-CM

## 2024-01-05 DIAGNOSIS — R29818 Other symptoms and signs involving the nervous system: Secondary | ICD-10-CM

## 2024-01-05 NOTE — Therapy (Signed)
 " OUTPATIENT OCCUPATIONAL THERAPY NEURO TREATMENT NOTE  Patient Name: Edward Rodgers MRN: [id number] DOB:15-May-1950, 73 y.o., male Today's Date: 01/05/2024    END OF SESSION:  OT End of Session - 01/05/24 0959     Visit Number 2    Number of Visits 8    Date for Recertification  01/28/24    Authorization Type [employer] Medicare    Authorization Time Period requesting auth    Progress Note Due on Visit 10    OT Start Time 0913    OT Stop Time 0955    OT Time Calculation (min) 42 min    Activity Tolerance Patient tolerated treatment well    Behavior During Therapy [facility] for tasks assessed/performed          Past Medical History:  Diagnosis Date   Cancer (HCC) 2002   prostate   CHF (congestive heart failure) (HCC)    Gout    Hypertension    Stroke (HCC)    Thyroid  disease    Past Surgical History:  Procedure Laterality Date   COLONOSCOPY N/A 07/15/2017   Procedure: COLONOSCOPY;  Surgeon: Golda Claudis PENNER, MD;  Location: [facility];  Service: Endoscopy;  Laterality: N/A;  830   IR CT HEAD LTD  02/14/2023   IR PERCUTANEOUS ART THROMBECTOMY/INFUSION INTRACRANIAL INC DIAG ANGIO  02/14/2023   LOOP RECORDER INSERTION N/A 02/20/2023   Procedure: LOOP RECORDER INSERTION;  Surgeon: Fernande Elspeth BROCKS, MD;  Location: [facility] INVASIVE CV LAB;  Service: Cardiovascular;  Laterality: N/A;   POLYPECTOMY  07/15/2017   Procedure: POLYPECTOMY;  Surgeon: Golda Claudis PENNER, MD;  Location: [facility];  Service: Endoscopy;;  colon   PROSTATE SURGERY     RADIOLOGY WITH ANESTHESIA Left 02/14/2023   Procedure: IR WITH ANESTHESIA;  Surgeon: Radiologist, Medication, MD;  Location: [facility] OR;  Service: Radiology;  Laterality: Left;   RADIOLOGY WITH ANESTHESIA N/A 02/14/2023   Procedure: IR WITH ANESTHESIA;  Surgeon: Radiologist, Medication, MD;  Location: [facility] OR;  Service: Radiology;  Laterality: N/A;   Patient Active Problem List   Diagnosis Date Noted   Cognitive change 03/17/2023   Hyponatremia 03/13/2023   Pulmonary  embolus (HCC) 03/05/2023   Acute deep vein thrombosis (DVT) of popliteal vein of left lower extremity (HCC) 03/05/2023   Chronic heart failure with mildly reduced ejection fraction (HFmrEF, 41-49%) (HCC) 03/05/2023   AKI (acute kidney injury) 03/05/2023   Precordial pain 03/03/2023   Shortness of breath 03/03/2023   Benign hypertension 03/03/2023   Heart failure with improved ejection fraction (HFimpEF) (HCC) 03/03/2023   Coping style affecting medical condition 02/27/2023   Acute right PCA stroke (HCC) 02/21/2023   Acute ischemic right PCA stroke (HCC) 02/14/2023   Posterior cerebral artery embolism, right 02/14/2023   Special screening for malignant neoplasms, colon 04/09/2017   Dizziness 03/29/2013   Cerebellar stroke, acute (HCC) 03/29/2013   CHF (congestive heart failure) (HCC) 03/28/2013   Vertigo 03/28/2013   PCP: Dr. Norleen Hurst  REFERRING PROVIDER: Dr. Murray Collier  ONSET DATE: 02/2023  REFERRING DIAG: P36.468 (ICD-10-CM) - Acute right PCA stroke (HCC)   THERAPY DIAG:  Other lack of coordination  Other symptoms and signs involving the nervous system  Left-sided weakness  Rationale for Evaluation and Treatment: Rehabilitation  SUBJECTIVE:   SUBJECTIVE STATEMENT: S: I have difficulty with my left hand, I can't get it straightened out Pt accompanied by: self  PERTINENT HISTORY: 02/14/2023; woke up could not move; stroke; [place] and then CIR; then had  home health and now referred to outpatient; he has an AFO but not wearing; states it does not fit. Had a WC but sent that back as he was not using it.  Steps most challenging thing for him; no handrails for basement steps. States he is supposed to be having a surgery on his neck.  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  Has washer and dryer in basement; wood  stove.   PRECAUTIONS: Fall  WEIGHT BEARING RESTRICTIONS: No  PAIN:  Are you having pain? No  FALLS: Has patient fallen in last 6 months? No  PLOF: Independent  PATIENT GOALS: To be able to use the left hand to help with things.   OBJECTIVE:  Note: Objective measures were completed at Evaluation unless otherwise noted.  HAND DOMINANCE: Right  ADLs: Overall ADLs: Pt reports difficulty with holding items in the left hand, drops a lot of things. Pt reports he needs assistance with tying shoes, operating buttons. Pt has difficulty with cutting food when trying to hold the fork in his left hand.   FUNCTIONAL OUTCOME MEASURES: Quick Dash:  QUICK DASH  Please rate your ability do the following activities in the last week by selecting the number below the appropriate response.   Activities Rating  Open a tight or new jar.  5 = Unable  Do heavy household chores (e.g., wash walls, floors). 5 = Unable  Carry a shopping bag or briefcase 4 = Severe difficulty  Wash your back. 5 = Unable  Use a knife to cut food. 2 = Mild difficulty  Recreational activities in which you take some force or impact through your arm, shoulder or hand (e.g., golf, hammering, tennis, etc.). 5 = Unable  During the past week, to what extent has your arm, shoulder or hand problem interfered with your normal social activities with family, friends, neighbors or groups?  2 = Slightly  During the past week, were you limited in your work or other regular daily activities as a result of your arm, shoulder or hand problem? 3 = Moderately limited  Rate the severity of the following symptoms in the last week: Arm, Shoulder, or hand pain. 4 = Severe  Rate the severity of the following symptoms in the last week: Tingling (pins and needles) in your arm, shoulder or hand. 2 = Mild  During the past week, how much difficulty have you had sleeping because of the pain in your arm, shoulder or hand?  2 = Mild difficulty   (A  QuickDASH score may not be calculated if there is greater than 1 missing item.)  Quick Dash Disability/Symptom Score: [(sum of 39 (n) responses/11 (n)] -1 x 25 = 63.63  Minimally Clinically Important Difference (MCID): 15-20 points  (Franchignoni, F. et al. (2013). Minimally clinically important difference of the disabilities of the arm, shoulder, and hand outcome measures (DASH) and its shortened version (Quick DASH). Journal of Orthopaedic & Sports Physical Therapy, 44(1), 30-39)    UPPER EXTREMITY ROM:    Active ROM Left eval  Shoulder flexion 92  Shoulder abduction 95  Shoulder internal rotation 90  Shoulder external rotation 34  Wrist flexion WFL  Wrist extension WFL  Wrist ulnar deviation WFL  Wrist radial deviation WFL  Wrist pronation WFL  Wrist supination WFL  (Blank rows = not tested)  UPPER EXTREMITY MMT:     MMT Left eval  Shoulder flexion 4/5  Shoulder abduction 4/5  Shoulder internal rotation 5/5  Shoulder external rotation 4-/5  Elbow flexion 5/5  Elbow extension 4+/5  Wrist flexion 5/5  Wrist extension 5/5  Wrist ulnar deviation 5/5  Wrist radial deviation 5/5  Wrist pronation 5/5  Wrist supination 5/5  (Blank rows = not tested)  HAND FUNCTION: Grip strength: Right: 65 lbs; Left: 50 lbs, Lateral pinch: Right: 19 lbs, Left: 15 lbs, and 3 point pinch: Right: 12 lbs, Left: 12 lbs  COORDINATION: 9 Hole Peg test: Right: 33.9 sec; Left: 1'53 sec  SENSATION: [place] Speed: 2.83-3.22=normal to decreased light touch sensation  EDEMA: None  COGNITION: Overall cognitive status: Within functional limits for tasks assessed  VISION: Subjective report: residual double vision Baseline vision: Wears glasses all the time Visual history: glaucoma, cataracts, and had cataract surgery ~2006                                                                                                                             TREATMENT DATE:   01/05/24 -digit ROM:  composite flexion, abduction, opposition, finger taps, x10 -Wrist strengthening: 1#, flexion, extension, ulnar/radial deviation, supination/pronation, x10 -sponges: 9, 10, 9 -Therabar: yellow, extension twist, pronated bends, supinated bends, x12 -cubes: stacking 3 towers of 5 cubes -Pinch strengthening: yellow and red clips, stacking 2 stacks of 5 cubes    PATIENT EDUCATION: Education details: finger A/ROM Person educated: Patient Education method: Programmer, Multimedia, Facilities Manager, and Handouts Education comprehension: verbalized understanding and returned demonstration  HOME EXERCISE PROGRAM: Eval: finger A/ROM   GOALS: Goals reviewed with patient? Yes  SHORT TERM GOALS: Target date: 01/29/24  Pt will be provided with and educated on HEP to improve ability to use left hand during ADLs and housework tasks.   Goal status: INITIAL  2.  Pt will be educated on strategies to improve ability to hug people and remove arm without brushing against people inappropriately.   Goal status: INITIAL  3.  Pt will improve fine motor coordination in LUE required for operating buttons and manipulating small objects by completing 9 hole peg test in under 1'.   Goal status: INITIAL  4.  Pt will increase left hand grip strength by 8# to improve ability to grasp and move items such as a backpack or grocery bag.   Goal status: INITIAL  5.  Pt will increase LUE A/ROM by 20 degrees or greater to improve ability to perform functional reaching required for bathing, dressing, and reaching behind back.   Goal status: INITIAL  6.  Pt will increase LUE strength for shoulder flexion and abduction to 4+/5 or greater to improve ability to perform functional reaching tasks above shoulder height.   Goal status: INITIAL  7.  Pt will improve bilateral coordination and motor planning required for operating utensils by reporting independence with cutting daily foods.   Goal status:  INITIAL  ASSESSMENT:  CLINICAL IMPRESSION: Pt presenting to OT session with continued weakness and poor motor control in his L hand. He also was having severe sticking in his D5 PIP joint, limiting his grip. Session focused on motor control and fine motor mobility with increasing digit strength. OT providing verbal and tactile cuing for positioning and technique throughout session.   PERFORMANCE DEFICITS: in functional skills including ADLs, IADLs, coordination, dexterity, tone, ROM, strength, and UE functional use   PLAN:  OT FREQUENCY: 2x/week  OT DURATION: 4 weeks  PLANNED INTERVENTIONS: 97168 OT Re-evaluation, 97535 self care/ADL training, 02889 therapeutic exercise, 97530 therapeutic activity, 97112 neuromuscular re-education, 97140 manual therapy, patient/family education, and DME and/or AE instructions  RECOMMENDED OTHER SERVICES: None at this time  CONSULTED AND AGREED WITH PLAN OF CARE: Patient  PLAN FOR NEXT SESSION: Follow up on HEP, initiate motor planning, coordination, and strengthening work   Valentin Nightingale, OTR/L [phone] 01/05/2024, 10:00 AM    [employer] Medicare Auth Request Information Treatment Start Date: 12/29/23  Date of referral: 12/21/23 Referring provider: Dr. Murray Collier Referring diagnosis (ICD 10)? P36.468 (ICD-10-CM) - Acute right PCA stroke (HCC)  Treatment diagnosis (ICD 10)? (if different than referring diagnosis) R29.818, R27.8  What was this (referring dx) caused by? Other: CVA  Lysle of Condition: Chronic (continuous duration > 3 months)   Laterality: Lt  Current Functional Measure Score: DASH 63.63  Objective measurements identify impairments when they are compared to normal values, the uninvolved extremity, and prior level of function.  [x]  Yes  []  No  Objective assessment of functional ability: Moderate functional limitations   Briefly describe symptoms: decreased strength, coordination, and functional use of the LUE.  Difficulty with motor planning. Decreased independence in ADLs  How did symptoms start: stroke/CVA  Average pain intensity:  Last 24 hours: 0/10  Past week: 0/10  How often does the pt experience symptoms? Constantly  How much have the symptoms interfered with usual daily activities? Moderately  How has condition changed since care began at this facility? NA - initial visit  In general, how is the patients overall health? Good   BACK PAIN (STarT Back Screening Tool) No       "

## 2024-01-11 ENCOUNTER — Ambulatory Visit (HOSPITAL_COMMUNITY): Admitting: Occupational Therapy

## 2024-01-12 ENCOUNTER — Ambulatory Visit (HOSPITAL_COMMUNITY): Admitting: Occupational Therapy

## 2024-01-12 ENCOUNTER — Ambulatory Visit (HOSPITAL_COMMUNITY)

## 2024-01-12 ENCOUNTER — Encounter (HOSPITAL_COMMUNITY): Payer: Self-pay | Admitting: Occupational Therapy

## 2024-01-12 DIAGNOSIS — R531 Weakness: Secondary | ICD-10-CM

## 2024-01-12 DIAGNOSIS — R29818 Other symptoms and signs involving the nervous system: Secondary | ICD-10-CM

## 2024-01-12 DIAGNOSIS — R262 Difficulty in walking, not elsewhere classified: Secondary | ICD-10-CM

## 2024-01-12 DIAGNOSIS — R278 Other lack of coordination: Secondary | ICD-10-CM

## 2024-01-12 DIAGNOSIS — I63531 Cerebral infarction due to unspecified occlusion or stenosis of right posterior cerebral artery: Secondary | ICD-10-CM

## 2024-01-12 DIAGNOSIS — R2689 Other abnormalities of gait and mobility: Secondary | ICD-10-CM

## 2024-01-12 NOTE — Therapy (Signed)
 " OUTPATIENT PHYSICAL THERAPY LOWER EXTREMITY TREATMENT/PROGRESS NOTE Progress Note Reporting Period 12/03/2023 to 01/12/2024  See note below for Objective Data and Assessment of Progress/Goals.       Patient Name: Edward Rodgers MRN: [id number] DOB:[birth year], 73 y.o., male Today's Date: 01/12/2024  END OF SESSION:  PT End of Session - 01/12/24 0821     Visit Number 9    Number of Visits 12    Date for Recertification  01/14/24    Authorization Type [employer] Medicare    Authorization Time Period 12 visits approved 11/20-01/14/24    Authorization - Visit Number 9    Authorization - Number of Visits 12    Progress Note Due on Visit 10    PT Start Time 0818    PT Stop Time 0858    PT Time Calculation (min) 40 min    Activity Tolerance Patient tolerated treatment well    Behavior During Therapy [facility] for tasks assessed/performed            Past Medical History:  Diagnosis Date   Cancer (HCC) 2002   prostate   CHF (congestive heart failure) (HCC)    Gout    Hypertension    Stroke (HCC)    Thyroid  disease    Past Surgical History:  Procedure Laterality Date   COLONOSCOPY N/A 07/15/2017   Procedure: COLONOSCOPY;  Surgeon: Golda Claudis PENNER, MD;  Location: [facility];  Service: Endoscopy;  Laterality: N/A;  830   IR CT HEAD LTD  02/14/2023   IR PERCUTANEOUS ART THROMBECTOMY/INFUSION INTRACRANIAL INC DIAG ANGIO  02/14/2023   LOOP RECORDER INSERTION N/A 02/20/2023   Procedure: LOOP RECORDER INSERTION;  Surgeon: Fernande Elspeth BROCKS, MD;  Location: [facility] INVASIVE CV LAB;  Service: Cardiovascular;  Laterality: N/A;   POLYPECTOMY  07/15/2017   Procedure: POLYPECTOMY;  Surgeon: Golda Claudis PENNER, MD;  Location: [facility];  Service: Endoscopy;;  colon   PROSTATE SURGERY     RADIOLOGY WITH ANESTHESIA Left 02/14/2023   Procedure: IR WITH ANESTHESIA;  Surgeon: Radiologist, Medication, MD;  Location: [facility] OR;  Service: Radiology;  Laterality: Left;   RADIOLOGY WITH ANESTHESIA N/A 02/14/2023    Procedure: IR WITH ANESTHESIA;  Surgeon: Radiologist, Medication, MD;  Location: [facility] OR;  Service: Radiology;  Laterality: N/A;   Patient Active Problem List   Diagnosis Date Noted   Cognitive change 03/17/2023   Hyponatremia 03/13/2023   Pulmonary embolus (HCC) 03/05/2023   Acute deep vein thrombosis (DVT) of popliteal vein of left lower extremity (HCC) 03/05/2023   Chronic heart failure with mildly reduced ejection fraction (HFmrEF, 41-49%) (HCC) 03/05/2023   AKI (acute kidney injury) 03/05/2023   Precordial pain 03/03/2023   Shortness of breath 03/03/2023   Benign hypertension 03/03/2023   Heart failure with improved ejection fraction (HFimpEF) (HCC) 03/03/2023   Coping style affecting medical condition 02/27/2023   Acute right PCA stroke (HCC) 02/21/2023   Acute ischemic right PCA stroke (HCC) 02/14/2023   Posterior cerebral artery embolism, right 02/14/2023   Special screening for malignant neoplasms, colon 04/09/2017   Dizziness 03/29/2013   Cerebellar stroke, acute (HCC) 03/29/2013   CHF (congestive heart failure) (HCC) 03/28/2013   Vertigo 03/28/2013    PCP: Shona Norleen PEDLAR, MD  REFERRING PROVIDER: [name], MD  REFERRING DIAG: [phone] (ICD-10-CM) - Acute right PCA stroke (HCC)  THERAPY DIAG:  Other lack of coordination  Other symptoms and signs involving the nervous system  Left-sided weakness  Difficulty in walking, not elsewhere classified  Other abnormalities of gait and mobility  Rationale for Evaluation and Treatment: Rehabilitation  ONSET DATE: 02/14/2023  SUBJECTIVE:   SUBJECTIVE STATEMENT: Overall 60 to 70% better; he reports he falls some in quadruped position on the bed  Eval:02/14/2023; woke up could not move; stroke; [place] and then CIR; then had home health and now referred to outpatient; he has an AFO but not wearing; states it does not fit; asked him to bring in next visit.  Had a WC but sent that back as he was not using it.  Steps  most challenging thing for him; no handrails for basement steps. States he is supposed to be having a surgery on his neck?  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   Washer and dryer in basement; wood stove  PERTINENT HISTORY: CHF, HTN, gout, stroke PAIN:  Are you having pain? Yes: NPRS scale: 7/10 Pain location: left upper trap and shoulder Pain description: sharp Aggravating factors: turning head to left Relieving factors: lidocaine  patch  PRECAUTIONS: Fall  RED FLAGS: None   WEIGHT BEARING RESTRICTIONS: No  FALLS:  Has patient fallen in last 6 months? Yes. Number of falls 2; at hospital slid out of WC  LIVING ENVIRONMENT: Lives with: lives with their spouse Lives in: House/apartment Stairs: Yes: Internal: 10 steps; none Has following equipment at home: Vannie - 2 wheeled, Grab bars, and Ramped entry  OCCUPATION: retired neurosurgeon  PLOF: Independent with household mobility with device and wife assists with buttons and sometimes with bathing  PATIENT GOALS: to walk like I used to; do steps better  NEXT MD VISIT: next month  OBJECTIVE:  Note: Objective measures were completed at Evaluation unless otherwise noted.  DIAGNOSTIC FINDINGS: I63.531 (ICD-10-CM) - Acute right PCA stroke (HCC)  PATIENT SURVEYS:  LEFS  Extreme difficulty/unable (0), Quite a bit of difficulty (1), Moderate difficulty (2), Little difficulty (3), No difficulty (4) Survey date:    Any of your usual work, housework or school activities   2. Usual hobbies, recreational or sporting activities   3. Getting into/out of the bath   4. Walking between rooms   5. Putting on socks/shoes   6. Squatting    7. Lifting an object, like a bag of groceries from the floor   8. Performing light activities around your home   9. Performing heavy  activities around your home   10. Getting into/out of a car   11. Walking 2 blocks   12. Walking 1 mile   13. Going up/down 10 stairs (1 flight)   14. Standing for 1 hour   15.  sitting for 1 hour   16. Running on even ground   17. Running on uneven ground   18. Making sharp turns while running fast   19. Hopping    20. Rolling over in bed   Score total:  21/80; 26.3%     COGNITION: Overall cognitive status: Within functional limits for tasks assessed     SENSATION:   EDEMA:  None noted  MUSCLE LENGTH: Hamstrings:   POSTURE: flexed trunk   PALPATION:   LOWER EXTREMITY MMT:  MMT Right eval Left eval Left 01/12/24  Hip flexion 4+ 4 4+  Hip extension     Hip abduction     Hip adduction  Hip internal rotation     Hip external rotation     Knee flexion     Knee extension 5 4+ 5  Ankle dorsiflexion 5 4 4   Ankle plantarflexion     Ankle inversion     Ankle eversion      (Blank rows = not tested)   FUNCTIONAL TESTS:  5 times sit to stand: 1.02.43 sec using hands to push up to standing 2 minute walk test: 125 ft with rollator SLS right 7 and left 4  GAIT: Distance walked: 125 ft Assistive device utilized: Walker - 4 wheeled Level of assistance: Modified independence Comments: slight toe drop left; decreased gait speed                                                                                                                                TREATMENT DATE:  01/12/24 Progress note Nustep seat 11 level 3 x 5' dynamic warm up 5 times sit to stand 30.88 sec using hands to push up 2 MWT 184 ft with rollator; continues with slight left foot drop SLS 4 each side LEFS 43/80 53.8% MMT's see above; good improvement but still has functional foot drop  01/04/24 Heel raises in incline x 10 Toe raises on decline x 10 4 box step ups x 10 with 1 UE assist 4 lateral box step ups with left leg leading with 2 UE assist x 10 Sit to stand 2 sets of 5 no UE  assist Nustep seat 11 level 3 x 5' to end treatment    12/30/23: Nustep seat 11 x 5' level 3 dynamic warm up Standing inside // bars Toe tapping 6in step height 3 x 10 (2HHA, 1HHA, then none) Tandem stance with Lt foot on floor, Rt foot on top of 6in step Rockerboard R/L then Df/Pf x 2 min each Heel raises 10x 2 Toe raises 10x 2 Sidestep with RTB around thigh 3RT inside // bars STS no HHA, cueing for nose over toes, required multiple attempts but able to complete from standard height.  12/28/23 Nustep seat 11 x 5' level 3 dynamic warm up Sit to stand with blue foam in chair x 10 no UE assist Sit to stand no UE assist x 10 Standing PWR twist x 10 Standing PWR rock reaching to numbers on wall Standing PWR twist reaching numbers on wall Standing marching with 4# on ankles 2 x 10 Standing hip extension 4# 2 x 10 Standing hip abduction 4# 2 x 10    12/21/23 Nustep seat 11 x 5' level 3 dynamic warm up Sit to stand with blue foam in chair no UE assist x 10 Sit to stand no foam with 1 UE assist x 5 Sit to stand no foam with no UE assist but needs min A to boost up to standing x 5 Heel raises on incline with 1 UE assist 2 x 10 Toe raises on decline with 2 UE assist  2 x 10 3# marching 2 x10 with 1 UE assist 3# hip abduction 2 x 10 with 2 UE assist 3# hip extension 2 x 10 with 2 UE assist   12/14/2023 Nustep seat 11 x 5' level 3 dynamic warm up 1 hand heel raises on incline x 20 1 hand toe raises on decline x 20 Sit to stand with foam in chair 2 x 5 no UE assist Seated left upper trap stretch x 5 Cervical retractions x 10 Stagger stance x 30 each Tandem stance x 30 each 2# marching with 1 UE assist x 10 then no UE assist x 10 2# sidestepping down and back // bars x 1   12/09/23  Standing in parallel bars with bil UE assist (unless stated):  Heel raises 20X  6 alternating box taps with 1 UE assist  6 forward step ups 1 UE 20X  4 forward lunges with 1 UE assist Sit  to stand 2x5 with foam in seat, no UE assist Sit to supine, supine to sit supervision and cues (due to having handbar at home) Supine:  bridge with 5 holds 10X2  SLR 10X each with core stabilization Nustep seat 11 x 5' EOS level 3 UE/LE   12/07/23  Review of HEP and goals Sit to stand x 5 with UE assist Sit to stand x 5 with no UE assist; min A to boost up to standing Standing  Bilateral UE assist heel toe raises x 10 SLS right 7 and left 4 4 box toe taps 2 x 10; 1st set with bilateral UE assist; 2nd set with 1 UE assist 4 box step ups 2 x 10 with 1 UE assist Seated on corner of mat table PWR up x 8 PWR rock x 8 PWR twist x 8 Nustep seat 11 x 5' to end treatment level 2   12/03/2023 physical therapy evaluation and HEP instruction    PATIENT EDUCATION:  Education details: Patient educated on exam findings, POC, scope of PT, HEP, and what to expect next visit. Person educated: Patient Education method: Explanation, Demonstration, and Handouts Education comprehension: verbalized understanding, returned demonstration, verbal cues required, and tactile cues required  HOME EXERCISE PROGRAM: Access Code: [email or web address] URL: [email or web address] Date: 12/03/2023 Prepared by: [facility]  Exercises - Sit to Stand with Armchair  - 2 x daily - 7 x weekly - 1 sets - 5 reps - Standing Ankle Plantar Flexion Dorsiflexion with Counter Support  - 2 x daily - 7 x weekly - 2 sets - 10 reps  ASSESSMENT:  CLINICAL IMPRESSION: Progress note today.  Met STG and LTG for % improved; increased distance on 2 MWT by 59 ft demonstrating increased gait speed and improved functional mobility.  Improved score on 5 times sit to stand test demonstrating increased leg strength and improved functional mobility.  Still demonstrates balance impairments.   Met goals for LEFS today with good improvement.    Patient will benefit from continued skilled therapy services to address remaining unmet and  partially met goals; deficits and promote return to optimal function.      Eval:Patient is a 73 y.o. male who was seen today for physical therapy evaluation and treatment for I63.531 (ICD-10-CM) - Acute right PCA stroke (HCC). Patient demonstrates decreased strength, balance deficits and gait abnormalities which are negatively impacting patient ability to perform ADLs and functional mobility tasks. Patient will benefit from skilled physical therapy services to address these deficits to improve level of function with ADLs, functional mobility  tasks, and reduce risk for falls.    OBJECTIVE IMPAIRMENTS: Abnormal gait, increased fascial restrictions, impaired perceived functional ability, and pain.   ACTIVITY LIMITATIONS: carrying, lifting, bending, standing, squatting, stairs, and locomotion level  PARTICIPATION LIMITATIONS: meal prep, cleaning, laundry, driving, shopping, and occupation  REHAB POTENTIAL: Good  CLINICAL DECISION MAKING: Evolving/moderate complexity  EVALUATION COMPLEXITY: Moderate   GOALS: Goals reviewed with patient? No  SHORT TERM GOALS: Target date: 12/24/2023 patient will be independent with initial HEP and compliant with HEP 3-4 times a week   Baseline: Goal status: in progress  2.  Patient will report 30% improvement overall  Baseline:  Goal status: met   LONG TERM GOALS: Target date: 02/09/2024  Patient will be independent in self management strategies to improve quality of life and functional outcomes.  Baseline:  Goal status: in progress  2.  Patient will report 50% improvement overall  Baseline:  Goal status: met  3.  Patient will improve LEFS score by 9 points to demonstrate improved perceived function  Baseline: 21/80; 43/80 01/12/24 Goal status: met  4.  Patient will increase distance on 2 MWT to 225 ft or more with LRAD to demonstrate improved speed and efficiency with household and community ambulation  Baseline: 125 fit with rollator;  184 ft with rollator 01/12/24 Goal status: in progress  5.  Patient will improve 5 times sit to stand score to 30 sec or less to demonstrate improved functional mobility and increased leg strength.  Baseline: 1 min and 2.45 sec; 30.88 sec 01/12/24 Goal status: in progress    PLAN:  PT FREQUENCY: 1-2x/week  PT DURATION: 4 weeks  PLANNED INTERVENTIONS: 97164- PT Re-evaluation, 97110-Therapeutic exercises, 97530- Therapeutic activity, 97112- Neuromuscular re-education, 97535- Self Care, 02859- Manual therapy, [phone]- Gait training, [phone]- Orthotic Fit/training, [phone]- Canalith repositioning, [id number]- Aquatic Therapy, [phone]- Splinting, [phone]- Wound care (first 20 sq cm), 97598- Wound care (each additional 20 sq cm)Patient/Family education, Balance training, Stair training, Taping, Dry Needling, Joint mobilization, Joint manipulation, Spinal manipulation, Spinal mobilization, Scar mobilization, and DME instructions.   PLAN FOR NEXT SESSION:  Progress LE functional strength and stabilization; balance and gait; try quadruped next visit as he reports he falls some in this position on the bed continued 2 x a week for 4 more weeks to address remaining unmet and partially met goals.      8:21 AM, 01/12/2024 Skylee Baird Small Jimma Ortman MPT [facility] [place] [phone] Ph:[phone]  [facility] Medicare Auth Request Information Treatment Start Date: 12/03/2023  Date of referral: 11/23/2023 Referring provider: Murray Collier Referring diagnosis (ICD 10)? P36.468 Treatment diagnosis (ICD 10)? (if different than referring diagnosis) R27.8, R29.818, R53.1, R26.2, R26.89  What was this (referring dx) caused by? Other: stroke  Lysle of Condition: Chronic (continuous duration > 3 months)   Laterality: Lt  Current Functional Measure Score: LEFS 43/80; 53.8%  Objective measurements identify impairments when they are compared to normal values, the uninvolved extremity, and prior level of function.  [x]   Yes  []  No  Objective assessment of functional ability: Moderate functional limitations   Briefly describe symptoms: left side weakness; imbalance  How did symptoms start: stroke  Average pain intensity:  Last 24 hours: 0  Past week: 0  How often does the pt experience symptoms? Constantly  How much have the symptoms interfered with usual daily activities? Moderately  How has condition changed since care began at this facility? A little better  In general, how is the patients overall health? Good   BACK  PAIN (STarT Back Screening Tool) No    "

## 2024-01-12 NOTE — Therapy (Signed)
 " OUTPATIENT OCCUPATIONAL THERAPY NEURO TREATMENT NOTE  Patient Name: Edward Rodgers MRN: [id number] DOB:08/02/1950, 73 y.o., male Today's Date: 01/12/2024    END OF SESSION:  OT End of Session - 01/12/24 0827     Visit Number 3    Number of Visits 8    Date for Recertification  01/28/24    Authorization Type [employer]    Authorization Time Period 6 initial visits; requesting auth    Authorization - Visit Number 3    Authorization - Number of Visits 6    Progress Note Due on Visit 10    OT Start Time 0902    OT Stop Time [phone]    OT Time Calculation (min) 40 min    Activity Tolerance Patient tolerated treatment well    Behavior During Therapy [facility] for tasks assessed/performed           Past Medical History:  Diagnosis Date   Cancer (HCC) 2002   prostate   CHF (congestive heart failure) (HCC)    Gout    Hypertension    Stroke (HCC)    Thyroid  disease    Past Surgical History:  Procedure Laterality Date   COLONOSCOPY N/A 07/15/2017   Procedure: COLONOSCOPY;  Surgeon: Golda Claudis PENNER, MD;  Location: [facility];  Service: Endoscopy;  Laterality: N/A;  830   IR CT HEAD LTD  02/14/2023   IR PERCUTANEOUS ART THROMBECTOMY/INFUSION INTRACRANIAL INC DIAG ANGIO  02/14/2023   LOOP RECORDER INSERTION N/A 02/20/2023   Procedure: LOOP RECORDER INSERTION;  Surgeon: Fernande Elspeth BROCKS, MD;  Location: [facility] INVASIVE CV LAB;  Service: Cardiovascular;  Laterality: N/A;   POLYPECTOMY  07/15/2017   Procedure: POLYPECTOMY;  Surgeon: Golda Claudis PENNER, MD;  Location: [facility];  Service: Endoscopy;;  colon   PROSTATE SURGERY     RADIOLOGY WITH ANESTHESIA Left 02/14/2023   Procedure: IR WITH ANESTHESIA;  Surgeon: Radiologist, Medication, MD;  Location: [facility] OR;  Service: Radiology;  Laterality: Left;   RADIOLOGY WITH ANESTHESIA N/A 02/14/2023   Procedure: IR WITH ANESTHESIA;  Surgeon: Radiologist, Medication, MD;  Location: [facility] OR;  Service: Radiology;  Laterality: N/A;   Patient Active Problem List    Diagnosis Date Noted   Cognitive change 03/17/2023   Hyponatremia 03/13/2023   Pulmonary embolus (HCC) 03/05/2023   Acute deep vein thrombosis (DVT) of popliteal vein of left lower extremity (HCC) 03/05/2023   Chronic heart failure with mildly reduced ejection fraction (HFmrEF, 41-49%) (HCC) 03/05/2023   AKI (acute kidney injury) 03/05/2023   Precordial pain 03/03/2023   Shortness of breath 03/03/2023   Benign hypertension 03/03/2023   Heart failure with improved ejection fraction (HFimpEF) (HCC) 03/03/2023   Coping style affecting medical condition 02/27/2023   Acute right PCA stroke (HCC) 02/21/2023   Acute ischemic right PCA stroke (HCC) 02/14/2023   Posterior cerebral artery embolism, right 02/14/2023   Special screening for malignant neoplasms, colon 04/09/2017   Dizziness 03/29/2013   Cerebellar stroke, acute (HCC) 03/29/2013   CHF (congestive heart failure) (HCC) 03/28/2013   Vertigo 03/28/2013   PCP: Dr. Norleen Hurst  REFERRING PROVIDER: Dr. Murray Collier  ONSET DATE: 02/2023  REFERRING DIAG: P36.468 (ICD-10-CM) - Acute right PCA stroke (HCC)   THERAPY DIAG:  Other lack of coordination  Other symptoms and signs involving the nervous system  Rationale for Evaluation and Treatment: Rehabilitation  SUBJECTIVE:   SUBJECTIVE STATEMENT: S: I'm not sure if I'm doing my homework  PERTINENT HISTORY: 02/14/2023; woke up could not  move; stroke; [name] and then CIR; then had home health and now referred to outpatient; he has an AFO but not wearing; states it does not fit. Had a WC but sent that back as he was not using it.  Steps most challenging thing for him; no handrails for basement steps. States he is supposed to be having a surgery on his neck.  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  Has washer and dryer  in basement; wood stove.   PRECAUTIONS: Fall  WEIGHT BEARING RESTRICTIONS: No  PAIN:  Are you having pain? No  FALLS: Has patient fallen in last 6 months? No  PLOF: Independent  PATIENT GOALS: To be able to use the left hand to help with things.   OBJECTIVE:  Note: Objective measures were completed at Evaluation unless otherwise noted.  HAND DOMINANCE: Right  ADLs: Overall ADLs: Pt reports difficulty with holding items in the left hand, drops a lot of things. Pt reports he needs assistance with tying shoes, operating buttons. Pt has difficulty with cutting food when trying to hold the fork in his left hand.   FUNCTIONAL OUTCOME MEASURES: Quick Dash:  QUICK DASH  Please rate your ability do the following activities in the last week by selecting the number below the appropriate response.   Activities Rating  Open a tight or new jar.  5 = Unable  Do heavy household chores (e.g., wash walls, floors). 5 = Unable  Carry a shopping bag or briefcase 4 = Severe difficulty  Wash your back. 5 = Unable  Use a knife to cut food. 2 = Mild difficulty  Recreational activities in which you take some force or impact through your arm, shoulder or hand (e.g., golf, hammering, tennis, etc.). 5 = Unable  During the past week, to what extent has your arm, shoulder or hand problem interfered with your normal social activities with family, friends, neighbors or groups?  2 = Slightly  During the past week, were you limited in your work or other regular daily activities as a result of your arm, shoulder or hand problem? 3 = Moderately limited  Rate the severity of the following symptoms in the last week: Arm, Shoulder, or hand pain. 4 = Severe  Rate the severity of the following symptoms in the last week: Tingling (pins and needles) in your arm, shoulder or hand. 2 = Mild  During the past week, how much difficulty have you had sleeping because of the pain in your arm, shoulder or hand?  2 = Mild  difficulty   (A QuickDASH score may not be calculated if there is greater than 1 missing item.)  Quick Dash Disability/Symptom Score: [(sum of 39 (n) responses/11 (n)] -1 x 25 = 63.63  Minimally Clinically Important Difference (MCID): 15-20 points  (Franchignoni, F. et al. (2013). Minimally clinically important difference of the disabilities of the arm, shoulder, and hand outcome measures (DASH) and its shortened version (Quick DASH). Journal of Orthopaedic & Sports Physical Therapy, 44(1), 30-39)    UPPER EXTREMITY ROM:    Active ROM Left eval  Shoulder flexion 92  Shoulder abduction 95  Shoulder internal rotation 90  Shoulder external rotation 34  Wrist flexion WFL  Wrist extension WFL  Wrist ulnar deviation WFL  Wrist radial deviation WFL  Wrist pronation [facility]  Wrist supination WFL  (Blank rows = not tested)  UPPER EXTREMITY MMT:     MMT Left eval  Shoulder flexion 4/5  Shoulder abduction 4/5  Shoulder internal rotation 5/5  Shoulder external rotation 4-/5  Elbow flexion 5/5  Elbow extension 4+/5  Wrist flexion 5/5  Wrist extension 5/5  Wrist ulnar deviation 5/5  Wrist radial deviation 5/5  Wrist pronation 5/5  Wrist supination 5/5  (Blank rows = not tested)  HAND FUNCTION: Grip strength: Right: 65 lbs; Left: 50 lbs, Lateral pinch: Right: 19 lbs, Left: 15 lbs, and 3 point pinch: Right: 12 lbs, Left: 12 lbs  COORDINATION: 9 Hole Peg test: Right: 33.9 sec; Left: 1'53 sec  SENSATION: [place] Speed: 2.83-3.22=normal to decreased light touch sensation  EDEMA: None  COGNITION: Overall cognitive status: Within functional limits for tasks assessed  VISION: Subjective report: residual double vision Baseline vision: Wears glasses all the time Visual history: glaucoma, cataracts, and had cataract surgery ~2006                                                                                                                             TREATMENT DATE:   01/11/24 -P/ROM: supine-flexion, abduction, horizontal abduction, er, 5 reps -A/ROM: supine-protraction, flexion, abduction, er, horizontal abduction, 10 reps -Proximal shoulder strengthening: supine-paddles, criss cross, circles each direction, 10 reps -Boom wackers:  -Pt alternating between colored boom wackers, OT calling color and pt hitting with his boomwacker  -OT holding boom wackers still, pt working on speed and fluidity going back and forth between the two colors.   Completed in 4 positions, side to side at chest height, up and down at chest height, and PNF patterns at chest   height. Good speed with side to side and up/down, mod difficulty with PNF patterns -Grip strengthening: large and medium beads at 42# with gripper horizontal  -Functional reaching: pt standing at tall mirror, reaching overhead to place pegs in top line of large pegboard. Placing 10 pegs with 2 rest breaks -Sponges: 9, 12, 12  01/05/24 -digit ROM: composite flexion, abduction, opposition, finger taps, x10 -Wrist strengthening: 1#, flexion, extension, ulnar/radial deviation, supination/pronation, x10 -sponges: 9, 10, 9 -Therabar: yellow, extension twist, pronated bends, supinated bends, x12 -cubes: stacking 3 towers of 5 cubes -Pinch strengthening: yellow and red clips, stacking 2 stacks of 5 cubes    PATIENT EDUCATION: Education details: reviewed HEP Person educated: Patient Education method: Programmer, Multimedia, Facilities Manager, and Handouts Education comprehension: verbalized understanding and returned demonstration  HOME EXERCISE PROGRAM: Eval: finger A/ROM   GOALS: Goals reviewed with patient? Yes  SHORT TERM GOALS: Target date: 01/29/24  Pt will be provided with and educated on HEP to improve ability to use left hand during ADLs and housework tasks.   Goal status: IN PROGRESS  2.  Pt will be educated on strategies to improve ability to hug people and remove arm without brushing against people  inappropriately.   Goal status: IN PROGRESS  3.  Pt will improve fine motor coordination in LUE required for operating buttons and manipulating small objects by completing 9 hole peg test in under 1'.   Goal status: IN PROGRESS  4.  Pt will increase left hand grip strength by 8# to improve ability to grasp and move items such as a backpack or grocery bag.   Goal status: IN PROGRESS  5.  Pt will increase LUE A/ROM by 20 degrees or greater to improve ability to perform functional reaching required for bathing, dressing, and reaching behind back.   Goal status: IN PROGRESS  6.  Pt will increase LUE strength for shoulder flexion and abduction to 4+/5 or greater to improve ability to perform functional reaching tasks above shoulder height.   Goal status: IN PROGRESS  7.  Pt will improve bilateral coordination and motor planning required for operating utensils by reporting independence with cutting daily foods.   Goal status: IN PROGRESS  ASSESSMENT:  CLINICAL IMPRESSION: Pt reports he is not sure if he has completed his finger HEP. Initiated LUE strengthening and motor planning in supine, completed functional reaching in standing. Pt with delayed motor planning, did well with low level speed task with exception of PNF patterns. Pt completing grip strengthening and coordination work today, improved with sponge mobility working on therapist, art. Verbal cuing for form and technique.     PERFORMANCE DEFICITS: in functional skills including ADLs, IADLs, coordination, dexterity, tone, ROM, strength, and UE functional use   PLAN:  OT FREQUENCY: 2x/week  OT DURATION: 4 weeks  PLANNED INTERVENTIONS: 97168 OT Re-evaluation, 97535 self care/ADL training, 02889 therapeutic exercise, 97530 therapeutic activity, 97112 neuromuscular re-education, 97140 manual therapy, patient/family education, and DME and/or AE instructions  CONSULTED AND AGREED WITH PLAN OF CARE: Patient  PLAN FOR NEXT  SESSION: Follow up on HEP, initiate motor planning, coordination, and strengthening work   [employer], OTR/L  [phone] 01/12/2024, 9:45 AM        "

## 2024-01-13 ENCOUNTER — Ambulatory Visit: Attending: Cardiology

## 2024-01-13 DIAGNOSIS — I639 Cerebral infarction, unspecified: Secondary | ICD-10-CM

## 2024-01-13 LAB — CUP PACEART REMOTE DEVICE CHECK
Date Time Interrogation Session: 20251230233056
Implantable Pulse Generator Implant Date: 20250207

## 2024-01-15 ENCOUNTER — Encounter (HOSPITAL_COMMUNITY): Payer: Self-pay

## 2024-01-15 ENCOUNTER — Ambulatory Visit (HOSPITAL_COMMUNITY): Attending: Internal Medicine

## 2024-01-15 DIAGNOSIS — R2689 Other abnormalities of gait and mobility: Secondary | ICD-10-CM | POA: Diagnosis present

## 2024-01-15 DIAGNOSIS — R278 Other lack of coordination: Secondary | ICD-10-CM | POA: Insufficient documentation

## 2024-01-15 DIAGNOSIS — R531 Weakness: Secondary | ICD-10-CM | POA: Diagnosis present

## 2024-01-15 DIAGNOSIS — I63531 Cerebral infarction due to unspecified occlusion or stenosis of right posterior cerebral artery: Secondary | ICD-10-CM | POA: Diagnosis present

## 2024-01-15 DIAGNOSIS — R29818 Other symptoms and signs involving the nervous system: Secondary | ICD-10-CM | POA: Diagnosis present

## 2024-01-15 DIAGNOSIS — R262 Difficulty in walking, not elsewhere classified: Secondary | ICD-10-CM | POA: Insufficient documentation

## 2024-01-15 NOTE — Therapy (Signed)
 " OUTPATIENT PHYSICAL THERAPY LOWER EXTREMITY TREATMENT/PROGRESS NOTE Progress Note Reporting Period 12/03/2023 to 01/12/2024  See note below for Objective Data and Assessment of Progress/Goals.       Patient Name: Edward Rodgers MRN: [id number] DOB:08-Aug-1950, 74 y.o., male Today's Date: 01/15/2024  END OF SESSION:  PT End of Session - 01/15/24 1034     Visit Number 10    Number of Visits 12    Date for Recertification  02/09/24    Authorization Type [employer]    Authorization Time Period 12 visits approved 11/20-01/14/24    Authorization - Visit Number 10    Authorization - Number of Visits 12    Progress Note Due on Visit 19   Progress note visit #9   PT Start Time 1035    PT Stop Time 1113    PT Time Calculation (min) 38 min    Equipment Utilized During Treatment Gait belt    Activity Tolerance Patient tolerated treatment well    Behavior During Therapy WFL for tasks assessed/performed            Past Medical History:  Diagnosis Date   Cancer (HCC) 2002   prostate   CHF (congestive heart failure) (HCC)    Gout    Hypertension    Stroke (HCC)    Thyroid  disease    Past Surgical History:  Procedure Laterality Date   COLONOSCOPY N/A 07/15/2017   Procedure: COLONOSCOPY;  Surgeon: Golda Claudis PENNER, MD;  Location: [facility];  Service: Endoscopy;  Laterality: N/A;  830   IR CT HEAD LTD  02/14/2023   IR PERCUTANEOUS ART THROMBECTOMY/INFUSION INTRACRANIAL INC DIAG ANGIO  02/14/2023   LOOP RECORDER INSERTION N/A 02/20/2023   Procedure: LOOP RECORDER INSERTION;  Surgeon: Fernande Elspeth BROCKS, MD;  Location: [facility] INVASIVE CV LAB;  Service: Cardiovascular;  Laterality: N/A;   POLYPECTOMY  07/15/2017   Procedure: POLYPECTOMY;  Surgeon: Golda Claudis PENNER, MD;  Location: [facility];  Service: Endoscopy;;  colon   PROSTATE SURGERY     RADIOLOGY WITH ANESTHESIA Left 02/14/2023   Procedure: IR WITH ANESTHESIA;  Surgeon: Radiologist, Medication, MD;  Location: [facility] OR;  Service: Radiology;   Laterality: Left;   RADIOLOGY WITH ANESTHESIA N/A 02/14/2023   Procedure: IR WITH ANESTHESIA;  Surgeon: Radiologist, Medication, MD;  Location: [facility] OR;  Service: Radiology;  Laterality: N/A;   Patient Active Problem List   Diagnosis Date Noted   Cognitive change 03/17/2023   Hyponatremia 03/13/2023   Pulmonary embolus (HCC) 03/05/2023   Acute deep vein thrombosis (DVT) of popliteal vein of left lower extremity (HCC) 03/05/2023   Chronic heart failure with mildly reduced ejection fraction (HFmrEF, 41-49%) (HCC) 03/05/2023   AKI (acute kidney injury) 03/05/2023   Precordial pain 03/03/2023   Shortness of breath 03/03/2023   Benign hypertension 03/03/2023   Heart failure with improved ejection fraction (HFimpEF) (HCC) 03/03/2023   Coping style affecting medical condition 02/27/2023   Acute right PCA stroke (HCC) 02/21/2023   Acute ischemic right PCA stroke (HCC) 02/14/2023   Posterior cerebral artery embolism, right 02/14/2023   Special screening for malignant neoplasms, colon 04/09/2017   Dizziness 03/29/2013   Cerebellar stroke, acute (HCC) 03/29/2013   CHF (congestive heart failure) (HCC) 03/28/2013   Vertigo 03/28/2013    PCP: Shona Norleen PEDLAR, MD  REFERRING PROVIDER: [name], MD  REFERRING DIAG: [phone] (ICD-10-CM) - Acute right PCA stroke (HCC)  THERAPY DIAG:  Other lack of coordination  Other symptoms and signs involving  the nervous system  Left-sided weakness  Difficulty in walking, not elsewhere classified  Other abnormalities of gait and mobility  Rationale for Evaluation and Treatment: Rehabilitation  ONSET DATE: 02/14/2023  SUBJECTIVE:   SUBJECTIVE STATEMENT: Feeling good today, no reports of pain or recent falls.  Arrived ambulating with Rolator today.  Eval:02/14/2023; woke up could not move; stroke; [name] and then CIR; then had home health and now referred to outpatient; he has an AFO but not wearing; states it does not fit; asked him to bring in  next visit.  Had a WC but sent that back as he was not using it.  Steps most challenging thing for him; no handrails for basement steps. States he is supposed to be having a surgery on his neck?  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   Washer and dryer in basement; wood stove  PERTINENT HISTORY: CHF, HTN, gout, stroke PAIN:  Are you having pain? Yes: NPRS scale: 7/10 Pain location: left upper trap and shoulder Pain description: sharp Aggravating factors: turning head to left Relieving factors: lidocaine  patch  PRECAUTIONS: Fall  RED FLAGS: None   WEIGHT BEARING RESTRICTIONS: No  FALLS:  Has patient fallen in last 6 months? Yes. Number of falls 2; at hospital slid out of WC  LIVING ENVIRONMENT: Lives with: lives with their spouse Lives in: House/apartment Stairs: Yes: Internal: 10 steps; none Has following equipment at home: Vannie - 2 wheeled, Grab bars, and Ramped entry  OCCUPATION: retired neurosurgeon  PLOF: Independent with household mobility with device and wife assists with buttons and sometimes with bathing  PATIENT GOALS: to walk like I used to; do steps better  NEXT MD VISIT: next month  OBJECTIVE:  Note: Objective measures were completed at Evaluation unless otherwise noted.  DIAGNOSTIC FINDINGS: I63.531 (ICD-10-CM) - Acute right PCA stroke (HCC)  PATIENT SURVEYS:  LEFS  Extreme difficulty/unable (0), Quite a bit of difficulty (1), Moderate difficulty (2), Little difficulty (3), No difficulty (4) Survey date:    Any of your usual work, housework or school activities   2. Usual hobbies, recreational or sporting activities   3. Getting into/out of the bath   4. Walking between rooms   5. Putting on socks/shoes   6. Squatting    7. Lifting an object, like a bag of groceries from the floor   8.  Performing light activities around your home   9. Performing heavy activities around your home   10. Getting into/out of a car   11. Walking 2 blocks   12. Walking 1 mile   13. Going up/down 10 stairs (1 flight)   14. Standing for 1 hour   15.  sitting for 1 hour   16. Running on even ground   17. Running on uneven ground   18. Making sharp turns while running fast   19. Hopping    20. Rolling over in bed   Score total:  21/80; 26.3%     COGNITION: Overall cognitive status: Within functional limits for tasks assessed     SENSATION:   EDEMA:  None noted  MUSCLE LENGTH: Hamstrings:   POSTURE: flexed trunk   PALPATION:   LOWER EXTREMITY MMT:  MMT Right eval Left eval Left 01/12/24  Hip flexion 4+ 4 4+  Hip extension  Hip abduction     Hip adduction     Hip internal rotation     Hip external rotation     Knee flexion     Knee extension 5 4+ 5  Ankle dorsiflexion 5 4 4   Ankle plantarflexion     Ankle inversion     Ankle eversion      (Blank rows = not tested)   FUNCTIONAL TESTS:  5 times sit to stand: 1.02.43 sec using hands to push up to standing 2 minute walk test: 125 ft with rollator SLS right 7 and left 4  GAIT: Distance walked: 125 ft Assistive device utilized: Walker - 4 wheeled Level of assistance: Modified independence Comments: slight toe drop left; decreased gait speed                                                                                                                                TREATMENT DATE:  01/15/24: Nustep seat 11 level 3 x 5' dynamic warm up UE/LE, SPM goal Quadruped crawl forward/backward on big mat 3RT Tall kneeling UE flexion 10x Quadruped UE flexion 10x Quadruped LE extension 10x alternating Heel tapping alternating 6in 20x initially BUE, 1UE then none Lateral heel tapping between 6 and 8in steps BUE, 1UE then none 20x  01/12/24 Progress note Nustep seat 11 level 3 x 5' dynamic warm up 5 times sit to  stand 30.88 sec using hands to push up 2 MWT 184 ft with rollator; continues with slight left foot drop SLS 4 each side LEFS 43/80 53.8% MMT's see above; good improvement but still has functional foot drop  01/04/24 Heel raises in incline x 10 Toe raises on decline x 10 4 box step ups x 10 with 1 UE assist 4 lateral box step ups with left leg leading with 2 UE assist x 10 Sit to stand 2 sets of 5 no UE assist Nustep seat 11 level 3 x 5' to end treatment    12/30/23: Nustep seat 11 x 5' level 3 dynamic warm up Standing inside // bars Toe tapping 6in step height 3 x 10 (2HHA, 1HHA, then none) Tandem stance with Lt foot on floor, Rt foot on top of 6in step Rockerboard R/L then Df/Pf x 2 min each Heel raises 10x 2 Toe raises 10x 2 Sidestep with RTB around thigh 3RT inside // bars STS no HHA, cueing for nose over toes, required multiple attempts but able to complete from standard height.  12/28/23 Nustep seat 11 x 5' level 3 dynamic warm up Sit to stand with blue foam in chair x 10 no UE assist Sit to stand no UE assist x 10 Standing PWR twist x 10 Standing PWR rock reaching to numbers on wall Standing PWR twist reaching numbers on wall Standing marching with 4# on ankles 2 x 10 Standing hip extension 4# 2 x 10 Standing hip abduction 4# 2 x 10    12/21/23 Nustep seat  11 x 5' level 3 dynamic warm up Sit to stand with blue foam in chair no UE assist x 10 Sit to stand no foam with 1 UE assist x 5 Sit to stand no foam with no UE assist but needs min A to boost up to standing x 5 Heel raises on incline with 1 UE assist 2 x 10 Toe raises on decline with 2 UE assist 2 x 10 3# marching 2 x10 with 1 UE assist 3# hip abduction 2 x 10 with 2 UE assist 3# hip extension 2 x 10 with 2 UE assist   12/14/2023 Nustep seat 11 x 5' level 3 dynamic warm up 1 hand heel raises on incline x 20 1 hand toe raises on decline x 20 Sit to stand with foam in chair 2 x 5 no UE assist Seated  left upper trap stretch x 5 Cervical retractions x 10 Stagger stance x 30 each Tandem stance x 30 each 2# marching with 1 UE assist x 10 then no UE assist x 10 2# sidestepping down and back // bars x 1   12/09/23  Standing in parallel bars with bil UE assist (unless stated):  Heel raises 20X  6 alternating box taps with 1 UE assist  6 forward step ups 1 UE 20X  4 forward lunges with 1 UE assist Sit to stand 2x5 with foam in seat, no UE assist Sit to supine, supine to sit supervision and cues (due to having handbar at home) Supine:  bridge with 5 holds 10X2  SLR 10X each with core stabilization Nustep seat 11 x 5' EOS level 3 UE/LE   12/07/23  Review of HEP and goals Sit to stand x 5 with UE assist Sit to stand x 5 with no UE assist; min A to boost up to standing Standing  Bilateral UE assist heel toe raises x 10 SLS right 7 and left 4 4 box toe taps 2 x 10; 1st set with bilateral UE assist; 2nd set with 1 UE assist 4 box step ups 2 x 10 with 1 UE assist Seated on corner of mat table PWR up x 8 PWR rock x 8 PWR twist x 8 Nustep seat 11 x 5' to end treatment level 2   12/03/2023 physical therapy evaluation and HEP instruction    PATIENT EDUCATION:  Education details: Patient educated on exam findings, POC, scope of PT, HEP, and what to expect next visit. Person educated: Patient Education method: Explanation, Demonstration, and Handouts Education comprehension: verbalized understanding, returned demonstration, verbal cues required, and tactile cues required  HOME EXERCISE PROGRAM: Access Code: [email or web address] URL: [email or web address] Date: 12/03/2023 Prepared by: [facility]  Exercises - Sit to Stand with Armchair  - 2 x daily - 7 x weekly - 1 sets - 5 reps - Standing Ankle Plantar Flexion Dorsiflexion with Counter Support  - 2 x daily - 7 x weekly - 2 sets - 10 reps  01/15/24: GLENWOOD Chang Dog  - 2 x daily - 7 x weekly - 2 sets - 10 reps - 5  hold  ASSESSMENT:  CLINICAL IMPRESSION: Began quadruped exercises for UE and LE weight bearing, core strengthening and balance training, cueing to extend Lt elbow while weight bearing.  Added heel tapping to improve dorsiflexion and SLS, able to complete with less UE support, SBA for safety.  Pt able to complete all exercises with no reports of pain and appropriate levels of fatigue.  Added quadruped exercises to HEP, printout  given and verbalized understanding.    Eval:Patient is a 74 y.o. male who was seen today for physical therapy evaluation and treatment for I63.531 (ICD-10-CM) - Acute right PCA stroke (HCC). Patient demonstrates decreased strength, balance deficits and gait abnormalities which are negatively impacting patient ability to perform ADLs and functional mobility tasks. Patient will benefit from skilled physical therapy services to address these deficits to improve level of function with ADLs, functional mobility tasks, and reduce risk for falls.    OBJECTIVE IMPAIRMENTS: Abnormal gait, increased fascial restrictions, impaired perceived functional ability, and pain.   ACTIVITY LIMITATIONS: carrying, lifting, bending, standing, squatting, stairs, and locomotion level  PARTICIPATION LIMITATIONS: meal prep, cleaning, laundry, driving, shopping, and occupation  REHAB POTENTIAL: Good  CLINICAL DECISION MAKING: Evolving/moderate complexity  EVALUATION COMPLEXITY: Moderate   GOALS: Goals reviewed with patient? No  SHORT TERM GOALS: Target date: 12/24/2023 patient will be independent with initial HEP and compliant with HEP 3-4 times a week   Baseline: Goal status: in progress  2.  Patient will report 30% improvement overall  Baseline:  Goal status: met   LONG TERM GOALS: Target date: 02/09/2024  Patient will be independent in self management strategies to improve quality of life and functional outcomes.  Baseline:  Goal status: in progress  2.  Patient will report  50% improvement overall  Baseline:  Goal status: met  3.  Patient will improve LEFS score by 9 points to demonstrate improved perceived function  Baseline: 21/80; 43/80 01/12/24 Goal status: met  4.  Patient will increase distance on 2 MWT to 225 ft or more with LRAD to demonstrate improved speed and efficiency with household and community ambulation  Baseline: 125 fit with rollator; 184 ft with rollator 01/12/24 Goal status: in progress  5.  Patient will improve 5 times sit to stand score to 30 sec or less to demonstrate improved functional mobility and increased leg strength.  Baseline: 1 min and 2.45 sec; 30.88 sec 01/12/24 Goal status: in progress    PLAN:  PT FREQUENCY: 1-2x/week  PT DURATION: 4 weeks  PLANNED INTERVENTIONS: 97164- PT Re-evaluation, 97110-Therapeutic exercises, 97530- Therapeutic activity, 97112- Neuromuscular re-education, 97535- Self Care, [id number]- Manual therapy, [phone]- Gait training, [phone]- Orthotic Fit/training, [phone]- Canalith repositioning, [id number]- Aquatic Therapy, [phone]- Splinting, [phone]- Wound care (first 20 sq cm), 97598- Wound care (each additional 20 sq cm)Patient/Family education, Balance training, Stair training, Taping, Dry Needling, Joint mobilization, Joint manipulation, Spinal manipulation, Spinal mobilization, Scar mobilization, and DME instructions.   PLAN FOR NEXT SESSION:  Progress LE functional strength and stabilization; balance and gait; try quadruped birddog and begin stair training next visit as he reports he falls some in this position on the bed continued 2 x a week for 4 more weeks to address remaining unmet and partially met goals.    Augustin Mclean, LPTA/CLT; CBIS [phone]    11:41 AM, 01/15/2024      "

## 2024-01-17 ENCOUNTER — Ambulatory Visit: Payer: Self-pay | Admitting: Cardiology

## 2024-01-18 ENCOUNTER — Ambulatory Visit (HOSPITAL_COMMUNITY): Admitting: Physical Therapy

## 2024-01-18 NOTE — Progress Notes (Signed)
 Remote Loop Recorder Transmission

## 2024-01-19 ENCOUNTER — Encounter (HOSPITAL_COMMUNITY): Payer: Self-pay | Admitting: Occupational Therapy

## 2024-01-19 ENCOUNTER — Encounter (HOSPITAL_COMMUNITY): Payer: Self-pay

## 2024-01-19 ENCOUNTER — Ambulatory Visit (HOSPITAL_COMMUNITY): Admitting: Physical Therapy

## 2024-01-19 DIAGNOSIS — R278 Other lack of coordination: Secondary | ICD-10-CM

## 2024-01-19 DIAGNOSIS — R262 Difficulty in walking, not elsewhere classified: Secondary | ICD-10-CM

## 2024-01-19 DIAGNOSIS — R2689 Other abnormalities of gait and mobility: Secondary | ICD-10-CM

## 2024-01-19 DIAGNOSIS — R29818 Other symptoms and signs involving the nervous system: Secondary | ICD-10-CM

## 2024-01-19 DIAGNOSIS — R531 Weakness: Secondary | ICD-10-CM

## 2024-01-19 NOTE — Therapy (Signed)
 " OUTPATIENT PHYSICAL THERAPY LOWER EXTREMITY TREATMENT      Patient Name: Edward Rodgers MRN: [id number] DOB:01/06/1951, 74 y.o., male Today's Date: 01/19/2024  END OF SESSION:  PT End of Session - 01/19/24 0908     Visit Number 11    Number of Visits 17    Date for Recertification  02/09/24    Authorization Type [employer] Medicare    Authorization Time Period [employer] Med approved 12 visits from 01/15/2024-02/26/2024    Authorization - Visit Number 2    Authorization - Number of Visits 12    Progress Note Due on Visit 19   Progress note complete visit #9   PT Start Time 0909   late sign in   PT Stop Time 0947    PT Time Calculation (min) 38 min    Equipment Utilized During Treatment Gait belt    Activity Tolerance Patient tolerated treatment well    Behavior During Therapy WFL for tasks assessed/performed            Past Medical History:  Diagnosis Date   Cancer (HCC) 2002   prostate   CHF (congestive heart failure) (HCC)    Gout    Hypertension    Stroke (HCC)    Thyroid  disease    Past Surgical History:  Procedure Laterality Date   COLONOSCOPY N/A 07/15/2017   Procedure: COLONOSCOPY;  Surgeon: Golda Claudis PENNER, MD;  Location: [facility];  Service: Endoscopy;  Laterality: N/A;  830   IR CT HEAD LTD  02/14/2023   IR PERCUTANEOUS ART THROMBECTOMY/INFUSION INTRACRANIAL INC DIAG ANGIO  02/14/2023   LOOP RECORDER INSERTION N/A 02/20/2023   Procedure: LOOP RECORDER INSERTION;  Surgeon: Fernande Elspeth BROCKS, MD;  Location: [facility];  Service: Cardiovascular;  Laterality: N/A;   POLYPECTOMY  07/15/2017   Procedure: POLYPECTOMY;  Surgeon: Golda Claudis PENNER, MD;  Location: [facility];  Service: Endoscopy;;  colon   PROSTATE SURGERY     RADIOLOGY WITH ANESTHESIA Left 02/14/2023   Procedure: IR WITH ANESTHESIA;  Surgeon: Radiologist, Medication, MD;  Location: [facility];  Service: Radiology;  Laterality: Left;   RADIOLOGY WITH ANESTHESIA N/A 02/14/2023   Procedure: IR WITH ANESTHESIA;   Surgeon: Radiologist, Medication, MD;  Location: [facility] OR;  Service: Radiology;  Laterality: N/A;   Patient Active Problem List   Diagnosis Date Noted   Cognitive change 03/17/2023   Hyponatremia 03/13/2023   Pulmonary embolus (HCC) 03/05/2023   Acute deep vein thrombosis (DVT) of popliteal vein of left lower extremity (HCC) 03/05/2023   Chronic heart failure with mildly reduced ejection fraction (HFmrEF, 41-49%) (HCC) 03/05/2023   AKI (acute kidney injury) 03/05/2023   Precordial pain 03/03/2023   Shortness of breath 03/03/2023   Benign hypertension 03/03/2023   Heart failure with improved ejection fraction (HFimpEF) (HCC) 03/03/2023   Coping style affecting medical condition 02/27/2023   Acute right PCA stroke (HCC) 02/21/2023   Acute ischemic right PCA stroke (HCC) 02/14/2023   Posterior cerebral artery embolism, right 02/14/2023   Special screening for malignant neoplasms, colon 04/09/2017   Dizziness 03/29/2013   Cerebellar stroke, acute (HCC) 03/29/2013   CHF (congestive heart failure) (HCC) 03/28/2013   Vertigo 03/28/2013    PCP: Shona Norleen PEDLAR, MD  REFERRING PROVIDER: [name], MD  REFERRING DIAG: [phone] (ICD-10-CM) - Acute right PCA stroke (HCC)  THERAPY DIAG:  Other symptoms and signs involving the nervous system  Other lack of coordination  Left-sided weakness  Difficulty in walking, not elsewhere  classified  Other abnormalities of gait and mobility  Rationale for Evaluation and Treatment: Rehabilitation  ONSET DATE: 02/14/2023  SUBJECTIVE:   SUBJECTIVE STATEMENT:  Reports increased difficulty with quadruped position on bed, wants to try out in floor.  Arrived in rollator today, has been walking a lot outside.  No reports of pain or recent fall.  Eval:02/14/2023; woke up could not move; stroke; [name] and then CIR; then had home health and now referred to outpatient; he has an AFO but not wearing; states it does not fit; asked him to bring in next  visit.  Had a WC but sent that back as he was not using it.  Steps most challenging thing for him; no handrails for basement steps. States he is supposed to be having a surgery on his neck?  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   Washer and dryer in basement; wood stove  PERTINENT HISTORY: CHF, HTN, gout, stroke PAIN:  Are you having pain? Yes: NPRS scale: 7/10 Pain location: left upper trap and shoulder Pain description: sharp Aggravating factors: turning head to left Relieving factors: lidocaine  patch  PRECAUTIONS: Fall  RED FLAGS: None   WEIGHT BEARING RESTRICTIONS: No  FALLS:  Has patient fallen in last 6 months? Yes. Number of falls 2; at hospital slid out of WC  LIVING ENVIRONMENT: Lives with: lives with their spouse Lives in: House/apartment Stairs: Yes: Internal: 10 steps; none Has following equipment at home: Vannie - 2 wheeled, Grab bars, and Ramped entry  OCCUPATION: retired neurosurgeon  PLOF: Independent with household mobility with device and wife assists with buttons and sometimes with bathing  PATIENT GOALS: to walk like I used to; do steps better  NEXT MD VISIT: next month  OBJECTIVE:  Note: Objective measures were completed at Evaluation unless otherwise noted.  DIAGNOSTIC FINDINGS: I63.531 (ICD-10-CM) - Acute right PCA stroke (HCC)  PATIENT SURVEYS:  LEFS  Extreme difficulty/unable (0), Quite a bit of difficulty (1), Moderate difficulty (2), Little difficulty (3), No difficulty (4) Survey date:    Any of your usual work, housework or school activities   2. Usual hobbies, recreational or sporting activities   3. Getting into/out of the bath   4. Walking between rooms   5. Putting on socks/shoes   6. Squatting    7. Lifting an object, like a bag of groceries from the floor   8.  Performing light activities around your home   9. Performing heavy activities around your home   10. Getting into/out of a car   11. Walking 2 blocks   12. Walking 1 mile   13. Going up/down 10 stairs (1 flight)   14. Standing for 1 hour   15.  sitting for 1 hour   16. Running on even ground   17. Running on uneven ground   18. Making sharp turns while running fast   19. Hopping    20. Rolling over in bed   Score total:  21/80; 26.3%     COGNITION: Overall cognitive status: Within functional limits for tasks assessed     SENSATION:   EDEMA:  None noted  MUSCLE LENGTH: Hamstrings:   POSTURE: flexed trunk   PALPATION:   LOWER EXTREMITY MMT:  MMT Right eval Left eval Left 01/12/24  Hip flexion 4+ 4  4+  Hip extension     Hip abduction     Hip adduction     Hip internal rotation     Hip external rotation     Knee flexion     Knee extension 5 4+ 5  Ankle dorsiflexion 5 4 4   Ankle plantarflexion     Ankle inversion     Ankle eversion      (Blank rows = not tested)   FUNCTIONAL TESTS:  5 times sit to stand: 1.02.43 sec using hands to push up to standing 2 minute walk test: 125 ft with rollator SLS right 7 and left 4  GAIT: Distance walked: 125 ft Assistive device utilized: [employer] - 4 wheeled Level of assistance: Modified independence Comments: slight toe drop left; decreased gait speed                                                                                                                                TREATMENT DATE:  01/19/24: Treadmill grade 2.0 .8-->1.0 x 5 min Quadruped: -Bird dog 10x 5 Tall kneeling: - UE flexion 10x Standing: - Stairs navigation 3RT reciprocal pattern with 2HR A  - Toe raises decline slope 20x   01/15/24: Nustep seat 11 level 3 x 5' dynamic warm up UE/LE, SPM goal Quadruped crawl forward/backward on big mat 3RT Tall kneeling UE flexion 10x Quadruped UE flexion 10x Quadruped LE extension 10x alternating Heel  tapping alternating 6in 20x initially BUE, 1UE then none Lateral heel tapping between 6 and 8in steps BUE, 1UE then none 20x  01/12/24 Progress note Nustep seat 11 level 3 x 5' dynamic warm up 5 times sit to stand 30.88 sec using hands to push up 2 MWT 184 ft with rollator; continues with slight left foot drop SLS 4 each side LEFS 43/80 53.8% MMT's see above; good improvement but still has functional foot drop  01/04/24 Heel raises in incline x 10 Toe raises on decline x 10 4 box step ups x 10 with 1 UE assist 4 lateral box step ups with left leg leading with 2 UE assist x 10 Sit to stand 2 sets of 5 no UE assist Nustep seat 11 level 3 x 5' to end treatment    12/30/23: Nustep seat 11 x 5' level 3 dynamic warm up Standing inside // bars Toe tapping 6in step height 3 x 10 (2HHA, 1HHA, then none) Tandem stance with Lt foot on floor, Rt foot on top of 6in step Rockerboard R/L then Df/Pf x 2 min each Heel raises 10x 2 Toe raises 10x 2 Sidestep with RTB around thigh 3RT inside // bars STS no HHA, cueing for nose over toes, required multiple attempts but able to complete from standard height.  12/28/23 Nustep seat 11 x 5' level 3 dynamic warm up Sit to stand with blue foam in chair x 10 no UE assist Sit to stand no UE assist x 10 Standing PWR twist  x 10 Standing PWR rock reaching to numbers on wall Standing PWR twist reaching numbers on wall Standing marching with 4# on ankles 2 x 10 Standing hip extension 4# 2 x 10 Standing hip abduction 4# 2 x 10    12/21/23 Nustep seat 11 x 5' level 3 dynamic warm up Sit to stand with blue foam in chair no UE assist x 10 Sit to stand no foam with 1 UE assist x 5 Sit to stand no foam with no UE assist but needs min A to boost up to standing x 5 Heel raises on incline with 1 UE assist 2 x 10 Toe raises on decline with 2 UE assist 2 x 10 3# marching 2 x10 with 1 UE assist 3# hip abduction 2 x 10 with 2 UE assist 3# hip extension 2 x  10 with 2 UE assist   12/14/2023 Nustep seat 11 x 5' level 3 dynamic warm up 1 hand heel raises on incline x 20 1 hand toe raises on decline x 20 Sit to stand with foam in chair 2 x 5 no UE assist Seated left upper trap stretch x 5 Cervical retractions x 10 Stagger stance x 30 each Tandem stance x 30 each 2# marching with 1 UE assist x 10 then no UE assist x 10 2# sidestepping down and back // bars x 1   12/09/23  Standing in parallel bars with bil UE assist (unless stated):  Heel raises 20X  6 alternating box taps with 1 UE assist  6 forward step ups 1 UE 20X  4 forward lunges with 1 UE assist Sit to stand 2x5 with foam in seat, no UE assist Sit to supine, supine to sit supervision and cues (due to having handbar at home) Supine:  bridge with 5 holds 10X2  SLR 10X each with core stabilization Nustep seat 11 x 5' EOS level 3 UE/LE   12/07/23  Review of HEP and goals Sit to stand x 5 with UE assist Sit to stand x 5 with no UE assist; min A to boost up to standing Standing  Bilateral UE assist heel toe raises x 10 SLS right 7 and left 4 4 box toe taps 2 x 10; 1st set with bilateral UE assist; 2nd set with 1 UE assist 4 box step ups 2 x 10 with 1 UE assist Seated on corner of mat table PWR up x 8 PWR rock x 8 PWR twist x 8 Nustep seat 11 x 5' to end treatment level 2   12/03/2023 physical therapy evaluation and HEP instruction    PATIENT EDUCATION:  Education details: Patient educated on exam findings, POC, scope of PT, HEP, and what to expect next visit. Person educated: Patient Education method: Explanation, Demonstration, and Handouts Education comprehension: verbalized understanding, returned demonstration, verbal cues required, and tactile cues required  HOME EXERCISE PROGRAM: Access Code: [email or web address] URL: [email or web address] Date: 12/03/2023 Prepared by: [facility]  Exercises - Sit to Stand with Armchair  - 2 x daily - 7 x weekly  - 1 sets - 5 reps - Standing Ankle Plantar Flexion Dorsiflexion with Counter Support  - 2 x daily - 7 x weekly - 2 sets - 10 reps  01/15/24: GLENWOOD Chang Dog  - 2 x daily - 7 x weekly - 2 sets - 10 reps - 5 hold  ASSESSMENT:  CLINICAL IMPRESSION: Began session gait training on treadmill with cueing to improve heel strike and toe push off.  Increased difficulty  with bird dog in quadruped for core and proximal strengthening, min cueing to improve sequence with opposite extremities and encouraged neutral spine with increased hip extension for strengthening.  Stair training complete with need for both handrails, able to complete reciprocal pattern safely, slow and careful.  Pt encouraged to add handrails to basement stairs at home for safety.  EOS pt limited by fatigue, no reports of pain through session.  Next session work on floor to standing as pt reports bird dog is hard to complete on soft bed.  Eval:Patient is a 74 y.o. male who was seen today for physical therapy evaluation and treatment for I63.531 (ICD-10-CM) - Acute right PCA stroke (HCC). Patient demonstrates decreased strength, balance deficits and gait abnormalities which are negatively impacting patient ability to perform ADLs and functional mobility tasks. Patient will benefit from skilled physical therapy services to address these deficits to improve level of function with ADLs, functional mobility tasks, and reduce risk for falls.    OBJECTIVE IMPAIRMENTS: Abnormal gait, increased fascial restrictions, impaired perceived functional ability, and pain.   ACTIVITY LIMITATIONS: carrying, lifting, bending, standing, squatting, stairs, and locomotion level  PARTICIPATION LIMITATIONS: meal prep, cleaning, laundry, driving, shopping, and occupation  REHAB POTENTIAL: Good  CLINICAL DECISION MAKING: Evolving/moderate complexity  EVALUATION COMPLEXITY: Moderate   GOALS: Goals reviewed with patient? No  SHORT TERM GOALS: Target date:  12/24/2023 patient will be independent with initial HEP and compliant with HEP 3-4 times a week   Baseline: Goal status: in progress  2.  Patient will report 30% improvement overall  Baseline:  Goal status: met   LONG TERM GOALS: Target date: 02/09/2024  Patient will be independent in self management strategies to improve quality of life and functional outcomes.  Baseline:  Goal status: in progress  2.  Patient will report 50% improvement overall  Baseline:  Goal status: met  3.  Patient will improve LEFS score by 9 points to demonstrate improved perceived function  Baseline: 21/80; 43/80 01/12/24 Goal status: met  4.  Patient will increase distance on 2 MWT to 225 ft or more with LRAD to demonstrate improved speed and efficiency with household and community ambulation  Baseline: 125 fit with rollator; 184 ft with rollator 01/12/24 Goal status: in progress  5.  Patient will improve 5 times sit to stand score to 30 sec or less to demonstrate improved functional mobility and increased leg strength.  Baseline: 1 min and 2.45 sec; 30.88 sec 01/12/24 Goal status: in progress    PLAN:  PT FREQUENCY: 1-2x/week  PT DURATION: 4 weeks  PLANNED INTERVENTIONS: 97164- PT Re-evaluation, 97110-Therapeutic exercises, 97530- Therapeutic activity, 97112- Neuromuscular re-education, 97535- Self Care, [id number]- Manual therapy, [phone]- Gait training, [phone]- Orthotic Fit/training, [phone]- Canalith repositioning, [id number]- Aquatic Therapy, [phone]- Splinting, [phone]- Wound care (first 20 sq cm), 97598- Wound care (each additional 20 sq cm)Patient/Family education, Balance training, Stair training, Taping, Dry Needling, Joint mobilization, Joint manipulation, Spinal manipulation, Spinal mobilization, Scar mobilization, and DME instructions.   PLAN FOR NEXT SESSION:  Progress LE functional strength and stabilization; balance and gait;  Begin floor to standing, stair training and continue gait training on  treadmill  Augustin Mclean, LPTA/CLT; CBIS [phone]    11:33 AM, 01/19/2024      "

## 2024-01-19 NOTE — Therapy (Signed)
 " OUTPATIENT OCCUPATIONAL THERAPY NEURO TREATMENT NOTE  Patient Name: Edward Rodgers MRN: [id number] DOB:Jul 17, 1950, 74 y.o., male Today's Date: 01/19/2024    END OF SESSION:  OT End of Session - 01/19/24 1145     Visit Number 4    Number of Visits 8    Date for Recertification  01/28/24    Authorization Type [employer]    Authorization Time Period 8 visits approved 12/29/23-02/23/24    Authorization - Visit Number 4    Authorization - Number of Visits 8    Progress Note Due on Visit 10    OT Start Time 1114    OT Stop Time 1145    OT Time Calculation (min) 31 min    Activity Tolerance Patient tolerated treatment well    Behavior During Therapy [facility] for tasks assessed/performed            Past Medical History:  Diagnosis Date   Cancer (HCC) 2002   prostate   CHF (congestive heart failure) (HCC)    Gout    Hypertension    Stroke (HCC)    Thyroid  disease    Past Surgical History:  Procedure Laterality Date   COLONOSCOPY N/A 07/15/2017   Procedure: COLONOSCOPY;  Surgeon: Golda Claudis PENNER, MD;  Location: [facility];  Service: Endoscopy;  Laterality: N/A;  830   IR CT HEAD LTD  02/14/2023   IR PERCUTANEOUS ART THROMBECTOMY/INFUSION INTRACRANIAL INC DIAG ANGIO  02/14/2023   LOOP RECORDER INSERTION N/A 02/20/2023   Procedure: LOOP RECORDER INSERTION;  Surgeon: Fernande Elspeth BROCKS, MD;  Location: [facility] INVASIVE CV LAB;  Service: Cardiovascular;  Laterality: N/A;   POLYPECTOMY  07/15/2017   Procedure: POLYPECTOMY;  Surgeon: Golda Claudis PENNER, MD;  Location: [facility];  Service: Endoscopy;;  colon   PROSTATE SURGERY     RADIOLOGY WITH ANESTHESIA Left 02/14/2023   Procedure: IR WITH ANESTHESIA;  Surgeon: Radiologist, Medication, MD;  Location: [facility] OR;  Service: Radiology;  Laterality: Left;   RADIOLOGY WITH ANESTHESIA N/A 02/14/2023   Procedure: IR WITH ANESTHESIA;  Surgeon: Radiologist, Medication, MD;  Location: [facility] OR;  Service: Radiology;  Laterality: N/A;   Patient Active Problem List    Diagnosis Date Noted   Cognitive change 03/17/2023   Hyponatremia 03/13/2023   Pulmonary embolus (HCC) 03/05/2023   Acute deep vein thrombosis (DVT) of popliteal vein of left lower extremity (HCC) 03/05/2023   Chronic heart failure with mildly reduced ejection fraction (HFmrEF, 41-49%) (HCC) 03/05/2023   AKI (acute kidney injury) 03/05/2023   Precordial pain 03/03/2023   Shortness of breath 03/03/2023   Benign hypertension 03/03/2023   Heart failure with improved ejection fraction (HFimpEF) (HCC) 03/03/2023   Coping style affecting medical condition 02/27/2023   Acute right PCA stroke (HCC) 02/21/2023   Acute ischemic right PCA stroke (HCC) 02/14/2023   Posterior cerebral artery embolism, right 02/14/2023   Special screening for malignant neoplasms, colon 04/09/2017   Dizziness 03/29/2013   Cerebellar stroke, acute (HCC) 03/29/2013   CHF (congestive heart failure) (HCC) 03/28/2013   Vertigo 03/28/2013   PCP: Dr. Norleen Hurst  REFERRING PROVIDER: Dr. Murray Collier  ONSET DATE: 02/2023  REFERRING DIAG: P36.468 (ICD-10-CM) - Acute right PCA stroke (HCC)   THERAPY DIAG:  Other symptoms and signs involving the nervous system  Other lack of coordination  Rationale for Evaluation and Treatment: Rehabilitation  SUBJECTIVE:   SUBJECTIVE STATEMENT: S: I do my homework when I remember it  PERTINENT HISTORY: 02/14/2023; woke up could not  move; stroke; [name] and then CIR; then had home health and now referred to outpatient; he has an AFO but not wearing; states it does not fit. Had a WC but sent that back as he was not using it.  Steps most challenging thing for him; no handrails for basement steps. States he is supposed to be having a surgery on his neck.  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  Has washer and dryer in  basement; wood stove.   PRECAUTIONS: Fall  WEIGHT BEARING RESTRICTIONS: No  PAIN:  Are you having pain? No  FALLS: Has patient fallen in last 6 months? No  PLOF: Independent  PATIENT GOALS: To be able to use the left hand to help with things.   OBJECTIVE:  Note: Objective measures were completed at Evaluation unless otherwise noted.  HAND DOMINANCE: Right  ADLs: Overall ADLs: Pt reports difficulty with holding items in the left hand, drops a lot of things. Pt reports he needs assistance with tying shoes, operating buttons. Pt has difficulty with cutting food when trying to hold the fork in his left hand.   FUNCTIONAL OUTCOME MEASURES: Quick Dash:  QUICK DASH  Please rate your ability do the following activities in the last week by selecting the number below the appropriate response.   Activities Rating  Open a tight or new jar.  5 = Unable  Do heavy household chores (e.g., wash walls, floors). 5 = Unable  Carry a shopping bag or briefcase 4 = Severe difficulty  Wash your back. 5 = Unable  Use a knife to cut food. 2 = Mild difficulty  Recreational activities in which you take some force or impact through your arm, shoulder or hand (e.g., golf, hammering, tennis, etc.). 5 = Unable  During the past week, to what extent has your arm, shoulder or hand problem interfered with your normal social activities with family, friends, neighbors or groups?  2 = Slightly  During the past week, were you limited in your work or other regular daily activities as a result of your arm, shoulder or hand problem? 3 = Moderately limited  Rate the severity of the following symptoms in the last week: Arm, Shoulder, or hand pain. 4 = Severe  Rate the severity of the following symptoms in the last week: Tingling (pins and needles) in your arm, shoulder or hand. 2 = Mild  During the past week, how much difficulty have you had sleeping because of the pain in your arm, shoulder or hand?  2 = Mild  difficulty   (A QuickDASH score may not be calculated if there is greater than 1 missing item.)  Quick Dash Disability/Symptom Score: [(sum of 39 (n) responses/11 (n)] -1 x 25 = 63.63  Minimally Clinically Important Difference (MCID): 15-20 points  (Franchignoni, F. et al. (2013). Minimally clinically important difference of the disabilities of the arm, shoulder, and hand outcome measures (DASH) and its shortened version (Quick DASH). Journal of Orthopaedic & Sports Physical Therapy, 44(1), 30-39)    UPPER EXTREMITY ROM:    Active ROM Left eval  Shoulder flexion 92  Shoulder abduction 95  Shoulder internal rotation 90  Shoulder external rotation 34  Wrist flexion WFL  Wrist extension WFL  Wrist ulnar deviation WFL  Wrist radial deviation WFL  Wrist pronation [facility]  Wrist supination WFL  (Blank rows = not tested)  UPPER EXTREMITY MMT:     MMT Left eval  Shoulder flexion 4/5  Shoulder abduction 4/5  Shoulder internal rotation 5/5  Shoulder external rotation 4-/5  Elbow flexion 5/5  Elbow extension 4+/5  Wrist flexion 5/5  Wrist extension 5/5  Wrist ulnar deviation 5/5  Wrist radial deviation 5/5  Wrist pronation 5/5  Wrist supination 5/5  (Blank rows = not tested)  HAND FUNCTION: Grip strength: Right: 65 lbs; Left: 50 lbs, Lateral pinch: Right: 19 lbs, Left: 15 lbs, and 3 point pinch: Right: 12 lbs, Left: 12 lbs  COORDINATION: 9 Hole Peg test: Right: 33.9 sec; Left: 1'53 sec  SENSATION: [place] Speed: 2.83-3.22=normal to decreased light touch sensation  EDEMA: None  COGNITION: Overall cognitive status: Within functional limits for tasks assessed  VISION: Subjective report: residual double vision Baseline vision: Wears glasses all the time Visual history: glaucoma, cataracts, and had cataract surgery ~2006                                                                                                                             TREATMENT DATE:   01/19/24 -Sponges: 11, 10, 11 -Small pegboard: pt placing pegs one at a time into small pegboard with left hand. Mod difficulty picking up pegs in correct position for placement, increased time required. Reminders to relax shoulders as well. Pt then removing pegs and holding in palm while removing all pegs, did not drop any pegs.  -Grip strengthening: large and medium beads at 42# with gripper horizontal  -Theraputty: red-flatten, using pvc pipe to cut circles into putty, rolling, gripping, 3 point pinch   01/11/24 -P/ROM: supine-flexion, abduction, horizontal abduction, er, 5 reps -A/ROM: supine-protraction, flexion, abduction, er, horizontal abduction, 10 reps -Proximal shoulder strengthening: supine-paddles, criss cross, circles each direction, 10 reps -Boom wackers:  -Pt alternating between colored boom wackers, OT calling color and pt hitting with his boomwacker  -OT holding boom wackers still, pt working on speed and fluidity going back and forth between the two colors.   Completed in 4 positions, side to side at chest height, up and down at chest height, and PNF patterns at chest   height. Good speed with side to side and up/down, mod difficulty with PNF patterns -Grip strengthening: large and medium beads at 42# with gripper horizontal  -Functional reaching: pt standing at tall mirror, reaching overhead to place pegs in top line of large pegboard. Placing 10 pegs with 2 rest breaks -Sponges: 9, 12, 12  01/05/24 -digit ROM: composite flexion, abduction, opposition, finger taps, x10 -Wrist strengthening: 1#, flexion, extension, ulnar/radial deviation, supination/pronation, x10 -sponges: 9, 10, 9 -Therabar: yellow, extension twist, pronated bends, supinated bends, x12 -cubes: stacking 3 towers of 5 cubes -Pinch strengthening: yellow and red clips, stacking 2 stacks of 5 cubes    PATIENT EDUCATION: Education details: reviewed HEP Person educated: Patient Education method:  Programmer, Multimedia, Facilities Manager, and Handouts Education comprehension:  verbalized understanding and returned demonstration  HOME EXERCISE PROGRAM: Eval: finger A/ROM   GOALS: Goals reviewed with patient? Yes  SHORT TERM GOALS: Target date: 01/29/24  Pt will be provided with and educated on HEP to improve ability to use left hand during ADLs and housework tasks.   Goal status: IN PROGRESS  2.  Pt will be educated on strategies to improve ability to hug people and remove arm without brushing against people inappropriately.   Goal status: IN PROGRESS  3.  Pt will improve fine motor coordination in LUE required for operating buttons and manipulating small objects by completing 9 hole peg test in under 1'.   Goal status: IN PROGRESS  4.  Pt will increase left hand grip strength by 8# to improve ability to grasp and move items such as a backpack or grocery bag.   Goal status: IN PROGRESS  5.  Pt will increase LUE A/ROM by 20 degrees or greater to improve ability to perform functional reaching required for bathing, dressing, and reaching behind back.   Goal status: IN PROGRESS  6.  Pt will increase LUE strength for shoulder flexion and abduction to 4+/5 or greater to improve ability to perform functional reaching tasks above shoulder height.   Goal status: IN PROGRESS  7.  Pt will improve bilateral coordination and motor planning required for operating utensils by reporting independence with cutting daily foods.   Goal status: IN PROGRESS  ASSESSMENT:  CLINICAL IMPRESSION: Pt reports he completes his HEP when he remembers it, he does still have the papers. Session focusing on coordination, grip and pinch today. Pt requiring increased time for motor planning during coordination work, cuing for reducing shoulder compensation when reaching. Added theraputty work using pvc pipe, increased time for motor planning. Verbal cuing for form and technique. Pt working on tasks required for  functional use of the LUE during ADLs.     PERFORMANCE DEFICITS: in functional skills including ADLs, IADLs, coordination, dexterity, tone, ROM, strength, and UE functional use   PLAN:  OT FREQUENCY: 2x/week  OT DURATION: 4 weeks  PLANNED INTERVENTIONS: 97168 OT Re-evaluation, 97535 self care/ADL training, 02889 therapeutic exercise, 97530 therapeutic activity, 97112 neuromuscular re-education, 97140 manual therapy, patient/family education, and DME and/or AE instructions  CONSULTED AND AGREED WITH PLAN OF CARE: Patient  PLAN FOR NEXT SESSION: Follow up on HEP, initiate motor planning, coordination, and strengthening work   [employer], OTR/L  [phone] 01/19/2024, 11:49 AM        "

## 2024-01-20 ENCOUNTER — Ambulatory Visit (HOSPITAL_COMMUNITY): Admitting: Occupational Therapy

## 2024-01-21 ENCOUNTER — Ambulatory Visit (HOSPITAL_COMMUNITY)

## 2024-01-22 ENCOUNTER — Encounter (HOSPITAL_COMMUNITY): Payer: Self-pay | Admitting: Occupational Therapy

## 2024-01-22 ENCOUNTER — Ambulatory Visit (HOSPITAL_COMMUNITY): Admitting: Occupational Therapy

## 2024-01-22 ENCOUNTER — Ambulatory Visit (HOSPITAL_COMMUNITY)

## 2024-01-22 ENCOUNTER — Encounter (HOSPITAL_COMMUNITY): Payer: Self-pay

## 2024-01-22 DIAGNOSIS — R278 Other lack of coordination: Secondary | ICD-10-CM

## 2024-01-22 DIAGNOSIS — R29818 Other symptoms and signs involving the nervous system: Secondary | ICD-10-CM

## 2024-01-22 DIAGNOSIS — R262 Difficulty in walking, not elsewhere classified: Secondary | ICD-10-CM

## 2024-01-22 DIAGNOSIS — R2689 Other abnormalities of gait and mobility: Secondary | ICD-10-CM

## 2024-01-22 DIAGNOSIS — R531 Weakness: Secondary | ICD-10-CM

## 2024-01-22 NOTE — Therapy (Signed)
 " OUTPATIENT OCCUPATIONAL THERAPY NEURO TREATMENT NOTE  Patient Name: Edward Rodgers MRN: [id number] DOB:04/15/1950, 74 y.o., male Today's Date: 01/22/2024    END OF SESSION:  OT End of Session - 01/22/24 1054     Visit Number 5    Number of Visits 8    Date for Recertification  01/28/24    Authorization Type [employer]    Authorization Time Period 8 visits approved 12/29/23-02/23/24    Authorization - Visit Number 5    Authorization - Number of Visits 8    Progress Note Due on Visit 10    OT Start Time 1020    OT Stop Time 1102    OT Time Calculation (min) 42 min    Activity Tolerance Patient tolerated treatment well    Behavior During Therapy [facility] for tasks assessed/performed             Past Medical History:  Diagnosis Date   Cancer (HCC) 2002   prostate   CHF (congestive heart failure) (HCC)    Gout    Hypertension    Stroke (HCC)    Thyroid  disease    Past Surgical History:  Procedure Laterality Date   COLONOSCOPY N/A 07/15/2017   Procedure: COLONOSCOPY;  Surgeon: Golda Claudis PENNER, MD;  Location: [facility];  Service: Endoscopy;  Laterality: N/A;  830   IR CT HEAD LTD  02/14/2023   IR PERCUTANEOUS ART THROMBECTOMY/INFUSION INTRACRANIAL INC DIAG ANGIO  02/14/2023   LOOP RECORDER INSERTION N/A 02/20/2023   Procedure: LOOP RECORDER INSERTION;  Surgeon: Fernande Elspeth BROCKS, MD;  Location: [facility] INVASIVE CV LAB;  Service: Cardiovascular;  Laterality: N/A;   POLYPECTOMY  07/15/2017   Procedure: POLYPECTOMY;  Surgeon: Golda Claudis PENNER, MD;  Location: [facility];  Service: Endoscopy;;  colon   PROSTATE SURGERY     RADIOLOGY WITH ANESTHESIA Left 02/14/2023   Procedure: IR WITH ANESTHESIA;  Surgeon: Radiologist, Medication, MD;  Location: [facility] OR;  Service: Radiology;  Laterality: Left;   RADIOLOGY WITH ANESTHESIA N/A 02/14/2023   Procedure: IR WITH ANESTHESIA;  Surgeon: Radiologist, Medication, MD;  Location: [facility] OR;  Service: Radiology;  Laterality: N/A;   Patient Active Problem  List   Diagnosis Date Noted   Cognitive change 03/17/2023   Hyponatremia 03/13/2023   Pulmonary embolus (HCC) 03/05/2023   Acute deep vein thrombosis (DVT) of popliteal vein of left lower extremity (HCC) 03/05/2023   Chronic heart failure with mildly reduced ejection fraction (HFmrEF, 41-49%) (HCC) 03/05/2023   AKI (acute kidney injury) 03/05/2023   Precordial pain 03/03/2023   Shortness of breath 03/03/2023   Benign hypertension 03/03/2023   Heart failure with improved ejection fraction (HFimpEF) (HCC) 03/03/2023   Coping style affecting medical condition 02/27/2023   Acute right PCA stroke (HCC) 02/21/2023   Acute ischemic right PCA stroke (HCC) 02/14/2023   Posterior cerebral artery embolism, right 02/14/2023   Special screening for malignant neoplasms, colon 04/09/2017   Dizziness 03/29/2013   Cerebellar stroke, acute (HCC) 03/29/2013   CHF (congestive heart failure) (HCC) 03/28/2013   Vertigo 03/28/2013   PCP: Dr. Norleen Hurst  REFERRING PROVIDER: Dr. Murray Collier  ONSET DATE: 02/2023  REFERRING DIAG: P36.468 (ICD-10-CM) - Acute right PCA stroke (HCC)   THERAPY DIAG:  Other symptoms and signs involving the nervous system  Other lack of coordination  Rationale for Evaluation and Treatment: Rehabilitation  SUBJECTIVE:   SUBJECTIVE STATEMENT: S: I keep forgetting to do my homework  PERTINENT HISTORY: 02/14/2023; woke up could not  move; stroke; [name] and then CIR; then had home health and now referred to outpatient; he has an AFO but not wearing; states it does not fit. Had a WC but sent that back as he was not using it.  Steps most challenging thing for him; no handrails for basement steps. States he is supposed to be having a surgery on his neck.  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  Has washer and  dryer in basement; wood stove.   PRECAUTIONS: Fall  WEIGHT BEARING RESTRICTIONS: No  PAIN:  Are you having pain? No  FALLS: Has patient fallen in last 6 months? No  PLOF: Independent  PATIENT GOALS: To be able to use the left hand to help with things.   OBJECTIVE:  Note: Objective measures were completed at Evaluation unless otherwise noted.  HAND DOMINANCE: Right  ADLs: Overall ADLs: Pt reports difficulty with holding items in the left hand, drops a lot of things. Pt reports he needs assistance with tying shoes, operating buttons. Pt has difficulty with cutting food when trying to hold the fork in his left hand.   FUNCTIONAL OUTCOME MEASURES: Quick Dash:  QUICK DASH  Please rate your ability do the following activities in the last week by selecting the number below the appropriate response.   Activities Rating  Open a tight or new jar.  5 = Unable  Do heavy household chores (e.g., wash walls, floors). 5 = Unable  Carry a shopping bag or briefcase 4 = Severe difficulty  Wash your back. 5 = Unable  Use a knife to cut food. 2 = Mild difficulty  Recreational activities in which you take some force or impact through your arm, shoulder or hand (e.g., golf, hammering, tennis, etc.). 5 = Unable  During the past week, to what extent has your arm, shoulder or hand problem interfered with your normal social activities with family, friends, neighbors or groups?  2 = Slightly  During the past week, were you limited in your work or other regular daily activities as a result of your arm, shoulder or hand problem? 3 = Moderately limited  Rate the severity of the following symptoms in the last week: Arm, Shoulder, or hand pain. 4 = Severe  Rate the severity of the following symptoms in the last week: Tingling (pins and needles) in your arm, shoulder or hand. 2 = Mild  During the past week, how much difficulty have you had sleeping because of the pain in your arm, shoulder or hand?  2 = Mild  difficulty   (A QuickDASH score may not be calculated if there is greater than 1 missing item.)  Quick Dash Disability/Symptom Score: [(sum of 39 (n) responses/11 (n)] -1 x 25 = 63.63  Minimally Clinically Important Difference (MCID): 15-20 points  (Franchignoni, F. et al. (2013). Minimally clinically important difference of the disabilities of the arm, shoulder, and hand outcome measures (DASH) and its shortened version (Quick DASH). Journal of Orthopaedic & Sports Physical Therapy, 44(1), 30-39)    UPPER EXTREMITY ROM:    Active ROM Left eval  Shoulder flexion 92  Shoulder abduction 95  Shoulder internal rotation 90  Shoulder external rotation 34  Wrist flexion WFL  Wrist extension WFL  Wrist ulnar deviation WFL  Wrist radial deviation WFL  Wrist pronation [facility]  Wrist supination WFL  (Blank rows = not tested)  UPPER EXTREMITY MMT:     MMT Left eval  Shoulder flexion 4/5  Shoulder abduction 4/5  Shoulder internal rotation 5/5  Shoulder external rotation 4-/5  Elbow flexion 5/5  Elbow extension 4+/5  Wrist flexion 5/5  Wrist extension 5/5  Wrist ulnar deviation 5/5  Wrist radial deviation 5/5  Wrist pronation 5/5  Wrist supination 5/5  (Blank rows = not tested)  HAND FUNCTION: Grip strength: Right: 65 lbs; Left: 50 lbs, Lateral pinch: Right: 19 lbs, Left: 15 lbs, and 3 point pinch: Right: 12 lbs, Left: 12 lbs  COORDINATION: 9 Hole Peg test: Right: 33.9 sec; Left: 1'53 sec  SENSATION: [place] Speed: 2.83-3.22=normal to decreased light touch sensation  EDEMA: None  COGNITION: Overall cognitive status: Within functional limits for tasks assessed  VISION: Subjective report: residual double vision Baseline vision: Wears glasses all the time Visual history: glaucoma, cataracts, and had cataract surgery ~2006                                                                                                                             TREATMENT DATE:   01/22/24 -Shoulder strengthening: sitting, 1# wrist weights-protraction, flexion, abduction, horizontal abduction, er, 10 reps -ABC writing: shoulder at 90 degrees flexion -Sponges: 13, 18, 20 -Pinch tree: pt reaching into flexion to place clothespins along vertical bar of pinch tree. Pt using lateral pinch to place clothespins, placing 12 pins total. Using 3 point pinch to remove. Increased time for task. -Grip strengthening: large and medium beads at 42# with gripper horizontal   01/19/24 -Sponges: 11, 10, 11 -Small pegboard: pt placing pegs one at a time into small pegboard with left hand. Mod difficulty picking up pegs in correct position for placement, increased time required. Reminders to relax shoulders as well. Pt then removing pegs and holding in palm while removing all pegs, did not drop any pegs.  -Grip strengthening: large and medium beads at 42# with gripper horizontal  -Theraputty: red-flatten, using pvc pipe to cut circles into putty, rolling, gripping, 3 point pinch   01/11/24 -P/ROM: supine-flexion, abduction, horizontal abduction, er, 5 reps -A/ROM: supine-protraction, flexion, abduction, er, horizontal abduction, 10 reps -Proximal shoulder strengthening: supine-paddles, criss cross, circles each direction, 10 reps -Boom wackers:  -Pt alternating between colored boom wackers, OT calling color and pt hitting with his boomwacker  -OT holding boom wackers still, pt working on speed and fluidity going back and forth between the two colors.   Completed in 4 positions, side to side at chest height, up and down at chest height, and PNF patterns at chest   height. Good speed with side to side and up/down, mod difficulty with PNF patterns -Grip strengthening: large and medium beads at 42# with gripper horizontal  -Functional reaching: pt standing at tall mirror, reaching overhead to place pegs in top line of large pegboard. Placing 10 pegs with 2 rest breaks -Sponges: 9, 12,  12    PATIENT EDUCATION: Education details: reviewed HEP Person educated: Patient Education method: Explanation, Demonstration, and Handouts Education comprehension: verbalized understanding and returned demonstration  HOME EXERCISE PROGRAM: Eval: finger A/ROM   GOALS: Goals reviewed with patient? Yes  SHORT TERM GOALS: Target date: 01/29/24  Pt will be provided with and educated on HEP to improve ability to use left hand during ADLs and housework tasks.   Goal status: IN PROGRESS  2.  Pt will be educated on strategies to improve ability to hug people and remove arm without brushing against people inappropriately.   Goal status: IN PROGRESS  3.  Pt will improve fine motor coordination in LUE required for operating buttons and manipulating small objects by completing 9 hole peg test in under 1'.   Goal status: IN PROGRESS  4.  Pt will increase left hand grip strength by 8# to improve ability to grasp and move items such as a backpack or grocery bag.   Goal status: IN PROGRESS  5.  Pt will increase LUE A/ROM by 20 degrees or greater to improve ability to perform functional reaching required for bathing, dressing, and reaching behind back.   Goal status: IN PROGRESS  6.  Pt will increase LUE strength for shoulder flexion and abduction to 4+/5 or greater to improve ability to perform functional reaching tasks above shoulder height.   Goal status: IN PROGRESS  7.  Pt will improve bilateral coordination and motor planning required for operating utensils by reporting independence with cutting daily foods.   Goal status: IN PROGRESS  ASSESSMENT:  CLINICAL IMPRESSION: Pt reports he has not completed his HEP, he has a lot going on and forgets. Pt completing LUE strengthening, stability, and motor planning work today with focus on improving LUE use during ADL tasks. Pt with mod fatigue during tasks, requiring intermittent rest breaks during tasks. Added pinch work incorporating  motor planning into the task. Verbal cuing for form and technique.     PERFORMANCE DEFICITS: in functional skills including ADLs, IADLs, coordination, dexterity, tone, ROM, strength, and UE functional use   PLAN:  OT FREQUENCY: 2x/week  OT DURATION: 4 weeks  PLANNED INTERVENTIONS: 97168 OT Re-evaluation, 97535 self care/ADL training, 02889 therapeutic exercise, 97530 therapeutic activity, 97112 neuromuscular re-education, 97140 manual therapy, patient/family education, and DME and/or AE instructions  CONSULTED AND AGREED WITH PLAN OF CARE: Patient  PLAN FOR NEXT SESSION: Follow up on HEP, initiate motor planning, coordination, and strengthening work   [employer], OTR/L  [id number] 01/22/2024, 11:05 AM        "

## 2024-01-22 NOTE — Therapy (Signed)
 " OUTPATIENT PHYSICAL THERAPY LOWER EXTREMITY TREATMENT      Patient Name: Edward Rodgers MRN: [id number] DOB:05-14-1950, 74 y.o., male Today's Date: 01/22/2024  END OF SESSION:  PT End of Session - 01/22/24 0910     Visit Number 12    Number of Visits 17    Date for Recertification  02/09/24    Authorization Type [employer] Medicare    Authorization Time Period [employer] Med approved 12 visits from 01/15/2024-02/26/2024    Authorization - Visit Number 3    Authorization - Number of Visits 12    Progress Note Due on Visit 19    PT Start Time 0911    PT Stop Time 0940    PT Time Calculation (min) 29 min    Equipment Utilized During Treatment Gait belt    Activity Tolerance Patient tolerated treatment well    Behavior During Therapy WFL for tasks assessed/performed             Past Medical History:  Diagnosis Date   Cancer (HCC) 2002   prostate   CHF (congestive heart failure) (HCC)    Gout    Hypertension    Stroke (HCC)    Thyroid  disease    Past Surgical History:  Procedure Laterality Date   COLONOSCOPY N/A 07/15/2017   Procedure: COLONOSCOPY;  Surgeon: Golda Claudis PENNER, MD;  Location: [facility];  Service: Endoscopy;  Laterality: N/A;  830   IR CT HEAD LTD  02/14/2023   IR PERCUTANEOUS ART THROMBECTOMY/INFUSION INTRACRANIAL INC DIAG ANGIO  02/14/2023   LOOP RECORDER INSERTION N/A 02/20/2023   Procedure: LOOP RECORDER INSERTION;  Surgeon: Fernande Elspeth BROCKS, MD;  Location: [facility] INVASIVE CV LAB;  Service: Cardiovascular;  Laterality: N/A;   POLYPECTOMY  07/15/2017   Procedure: POLYPECTOMY;  Surgeon: Golda Claudis PENNER, MD;  Location: [facility];  Service: Endoscopy;;  colon   PROSTATE SURGERY     RADIOLOGY WITH ANESTHESIA Left 02/14/2023   Procedure: IR WITH ANESTHESIA;  Surgeon: Radiologist, Medication, MD;  Location: [facility] OR;  Service: Radiology;  Laterality: Left;   RADIOLOGY WITH ANESTHESIA N/A 02/14/2023   Procedure: IR WITH ANESTHESIA;  Surgeon: Radiologist, Medication, MD;  Location:  [facility] OR;  Service: Radiology;  Laterality: N/A;   Patient Active Problem List   Diagnosis Date Noted   Cognitive change 03/17/2023   Hyponatremia 03/13/2023   Pulmonary embolus (HCC) 03/05/2023   Acute deep vein thrombosis (DVT) of popliteal vein of left lower extremity (HCC) 03/05/2023   Chronic heart failure with mildly reduced ejection fraction (HFmrEF, 41-49%) (HCC) 03/05/2023   AKI (acute kidney injury) 03/05/2023   Precordial pain 03/03/2023   Shortness of breath 03/03/2023   Benign hypertension 03/03/2023   Heart failure with improved ejection fraction (HFimpEF) (HCC) 03/03/2023   Coping style affecting medical condition 02/27/2023   Acute right PCA stroke (HCC) 02/21/2023   Acute ischemic right PCA stroke (HCC) 02/14/2023   Posterior cerebral artery embolism, right 02/14/2023   Special screening for malignant neoplasms, colon 04/09/2017   Dizziness 03/29/2013   Cerebellar stroke, acute (HCC) 03/29/2013   CHF (congestive heart failure) (HCC) 03/28/2013   Vertigo 03/28/2013    PCP: Shona Norleen PEDLAR, MD  REFERRING PROVIDER: [name], MD  REFERRING DIAG: [phone] (ICD-10-CM) - Acute right PCA stroke (HCC)  THERAPY DIAG:  Other symptoms and signs involving the nervous system  Other lack of coordination  Left-sided weakness  Difficulty in walking, not elsewhere classified  Other abnormalities of gait and mobility  Rationale for Evaluation and Treatment: Rehabilitation  ONSET DATE: 02/14/2023  SUBJECTIVE:   SUBJECTIVE STATEMENT: Pt reports no pain or falls since last session. Pt states he has mainly been walking for HEP, does the exercises when he remembers them. Pt feels the therapy is helping a whole lot.  Eval:02/14/2023; woke up could not move; stroke; [name] and then CIR; then had home health and now referred to outpatient; he has an AFO but not wearing; states it does not fit; asked him to bring in next visit.  Had a WC but sent that back as he was not  using it.  Steps most challenging thing for him; no handrails for basement steps. States he is supposed to be having a surgery on his neck?  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   Washer and dryer in basement; wood stove  PERTINENT HISTORY: CHF, HTN, gout, stroke PAIN:  Are you having pain? Yes: NPRS scale: 7/10 Pain location: left upper trap and shoulder Pain description: sharp Aggravating factors: turning head to left Relieving factors: lidocaine  patch  PRECAUTIONS: Fall  RED FLAGS: None   WEIGHT BEARING RESTRICTIONS: No  FALLS:  Has patient fallen in last 6 months? Yes. Number of falls 2; at hospital slid out of WC  LIVING ENVIRONMENT: Lives with: lives with their spouse Lives in: House/apartment Stairs: Yes: Internal: 10 steps; none Has following equipment at home: Vannie - 2 wheeled, Grab bars, and Ramped entry  OCCUPATION: retired neurosurgeon  PLOF: Independent with household mobility with device and wife assists with buttons and sometimes with bathing  PATIENT GOALS: to walk like I used to; do steps better  NEXT MD VISIT: next month  OBJECTIVE:  Note: Objective measures were completed at Evaluation unless otherwise noted.  DIAGNOSTIC FINDINGS: I63.531 (ICD-10-CM) - Acute right PCA stroke (HCC)  PATIENT SURVEYS:  LEFS  Extreme difficulty/unable (0), Quite a bit of difficulty (1), Moderate difficulty (2), Little difficulty (3), No difficulty (4) Survey date:    Any of your usual work, housework or school activities   2. Usual hobbies, recreational or sporting activities   3. Getting into/out of the bath   4. Walking between rooms   5. Putting on socks/shoes   6. Squatting    7. Lifting an object, like a bag of groceries from the floor   8. Performing light activities around your home   9.  Performing heavy activities around your home   10. Getting into/out of a car   11. Walking 2 blocks   12. Walking 1 mile   13. Going up/down 10 stairs (1 flight)   14. Standing for 1 hour   15.  sitting for 1 hour   16. Running on even ground   17. Running on uneven ground   18. Making sharp turns while running fast   19. Hopping    20. Rolling over in bed   Score total:  21/80; 26.3%     COGNITION: Overall cognitive status: Within functional limits for tasks assessed     SENSATION:   EDEMA:  None noted  MUSCLE LENGTH: Hamstrings:   POSTURE: flexed trunk   PALPATION:   LOWER EXTREMITY MMT:  MMT Right eval Left eval Left 01/12/24  Hip flexion 4+ 4 4+  Hip extension     Hip  abduction     Hip adduction     Hip internal rotation     Hip external rotation     Knee flexion     Knee extension 5 4+ 5  Ankle dorsiflexion 5 4 4   Ankle plantarflexion     Ankle inversion     Ankle eversion      (Blank rows = not tested)   FUNCTIONAL TESTS:  5 times sit to stand: 1.02.43 sec using hands to push up to standing 2 minute walk test: 125 ft with rollator SLS right 7 and left 4  GAIT: Distance walked: 125 ft Assistive device utilized: [employer]ant - 4 wheeled Level of assistance: Modified independence Comments: slight toe drop left; decreased gait speed                                                                                                                                TREATMENT DATE:  01/22/2024  Therapeutic Exercise: -Treadmill, 5 minutes, grade 2.0 incline, pt cued for normal pace, speed 1.0>1.2 Neuromuscular Re-education: -Rocker board, 3 sets of 10 DF/PF, 2 sets of 10 reps, IN/EV, pt cued for decreased UE support, requires mod assist for maintaining balance  -Cone marches, with 4lb ankle weights, 3 sets of 10 reps bilaterally, pt knocks cone over twice with LLE Therapeutic Activity: -Speed step ups, 4lb ankle weights, 8 inch step, 2 bouts of 30 seconds,  pt obtains about 7.5 to 8 reps per bout, pt cued for decreased UE support -Lateral step up and overs, 4lb ankle weights, 8 inch step, pt cued for decreased UE support  01/19/24: Treadmill grade 2.0 .8-->1.0 x 5 min Quadruped: -Bird dog 10x 5 Tall kneeling: - UE flexion 10x Standing: - Stairs navigation 3RT reciprocal pattern with 2HR A  - Toe raises decline slope 20x   01/15/24: Nustep seat 11 level 3 x 5' dynamic warm up UE/LE, SPM goal Quadruped crawl forward/backward on big mat 3RT Tall kneeling UE flexion 10x Quadruped UE flexion 10x Quadruped LE extension 10x alternating Heel tapping alternating 6in 20x initially BUE, 1UE then none Lateral heel tapping between 6 and 8in steps BUE, 1UE then none 20x  PATIENT EDUCATION:  Education details: Patient educated on exam findings, POC, scope of PT, HEP, and what to expect next visit. Person educated: Patient Education method: Explanation, Demonstration, and Handouts Education comprehension: verbalized understanding, returned demonstration, verbal cues required, and tactile cues required  HOME EXERCISE PROGRAM: Access Code: [email or web address] URL: [email or web address] Date: 12/03/2023 Prepared by: [facility]  Exercises - Sit to Stand with Armchair  - 2 x daily - 7 x weekly - 1 sets - 5 reps - Standing Ankle Plantar Flexion Dorsiflexion with Counter Support  - 2 x daily - 7 x weekly - 2 sets - 10 reps  01/15/24: GLENWOOD Chang Dog  - 2 x daily - 7 x weekly - 2 sets - 10 reps - 5 hold  ASSESSMENT:  CLINICAL  IMPRESSION: Patient continues to demonstrate decreased LLE strength, decreased gait quality and balance. Patient also demonstrates increased endurance with aerobic based exercise during today's session with increased speed tolerated. Patient able to progress dynamic balance and core activation exercises today with step up variations and rocker board activity, good performance with verbal cueing. Patient would continue to benefit from  skilled physical therapy for increased endurance with ambulation, increased LLE strength, and improved balance for improved quality of life, improved independence with gait training and continued progress towards therapy goals.   Eval:Patient is a 74 y.o. male who was seen today for physical therapy evaluation and treatment for I63.531 (ICD-10-CM) - Acute right PCA stroke (HCC). Patient demonstrates decreased strength, balance deficits and gait abnormalities which are negatively impacting patient ability to perform ADLs and functional mobility tasks. Patient will benefit from skilled physical therapy services to address these deficits to improve level of function with ADLs, functional mobility tasks, and reduce risk for falls.    OBJECTIVE IMPAIRMENTS: Abnormal gait, increased fascial restrictions, impaired perceived functional ability, and pain.   ACTIVITY LIMITATIONS: carrying, lifting, bending, standing, squatting, stairs, and locomotion level  PARTICIPATION LIMITATIONS: meal prep, cleaning, laundry, driving, shopping, and occupation  REHAB POTENTIAL: Good  CLINICAL DECISION MAKING: Evolving/moderate complexity  EVALUATION COMPLEXITY: Moderate   GOALS: Goals reviewed with patient? No  SHORT TERM GOALS: Target date: 12/24/2023 patient will be independent with initial HEP and compliant with HEP 3-4 times a week   Baseline: Goal status: in progress  2.  Patient will report 30% improvement overall  Baseline:  Goal status: met   LONG TERM GOALS: Target date: 02/09/2024  Patient will be independent in self management strategies to improve quality of life and functional outcomes.  Baseline:  Goal status: in progress  2.  Patient will report 50% improvement overall  Baseline:  Goal status: met  3.  Patient will improve LEFS score by 9 points to demonstrate improved perceived function  Baseline: 21/80; 43/80 01/12/24 Goal status: met  4.  Patient will increase distance on  2 MWT to 225 ft or more with LRAD to demonstrate improved speed and efficiency with household and community ambulation  Baseline: 125 fit with rollator; 184 ft with rollator 01/12/24 Goal status: in progress  5.  Patient will improve 5 times sit to stand score to 30 sec or less to demonstrate improved functional mobility and increased leg strength.  Baseline: 1 min and 2.45 sec; 30.88 sec 01/12/24 Goal status: in progress    PLAN:  PT FREQUENCY: 1-2x/week  PT DURATION: 4 weeks  PLANNED INTERVENTIONS: [id number]- PT Re-evaluation, 97110-Therapeutic exercises, 97530- Therapeutic activity, 97112- Neuromuscular re-education, 97535- Self Care, [id number]- Manual therapy, [phone]- Gait training, [phone]- Orthotic Fit/training, [phone]- Canalith repositioning, [id number]- Aquatic Therapy, [phone]- Splinting, [phone]- Wound care (first 20 sq cm), 97598- Wound care (each additional 20 sq cm)Patient/Family education, Balance training, Stair training, Taping, Dry Needling, Joint mobilization, Joint manipulation, Spinal manipulation, Spinal mobilization, Scar mobilization, and DME instructions.   PLAN FOR NEXT SESSION:  Progress LE functional strength and stabilization; balance and gait;  Begin floor to standing, stair training and continue gait training on treadmill  Lang Ada, PT, DPT [facility] Office: [phone] 9:51 AM, 01/22/2024       "

## 2024-01-25 ENCOUNTER — Encounter (HOSPITAL_COMMUNITY): Payer: Self-pay | Admitting: Occupational Therapy

## 2024-01-25 ENCOUNTER — Ambulatory Visit (HOSPITAL_COMMUNITY)

## 2024-01-25 ENCOUNTER — Ambulatory Visit (HOSPITAL_COMMUNITY): Admitting: Occupational Therapy

## 2024-01-25 DIAGNOSIS — R278 Other lack of coordination: Secondary | ICD-10-CM

## 2024-01-25 DIAGNOSIS — R2689 Other abnormalities of gait and mobility: Secondary | ICD-10-CM

## 2024-01-25 DIAGNOSIS — R29818 Other symptoms and signs involving the nervous system: Secondary | ICD-10-CM

## 2024-01-25 DIAGNOSIS — R531 Weakness: Secondary | ICD-10-CM

## 2024-01-25 DIAGNOSIS — R262 Difficulty in walking, not elsewhere classified: Secondary | ICD-10-CM

## 2024-01-25 NOTE — Therapy (Signed)
 " OUTPATIENT OCCUPATIONAL THERAPY NEURO TREATMENT NOTE  Patient Name: Edward Rodgers MRN: [id number] DOB:Feb 14, 1950, 74 y.o., male Today's Date: 01/25/2024    END OF SESSION:  OT End of Session - 01/25/24 1028     Visit Number 6    Number of Visits 8    Date for Recertification  01/28/24    Authorization Type [employer]    Authorization Time Period 8 visits approved 12/29/23-02/23/24    Authorization - Visit Number 6    Authorization - Number of Visits 8    Progress Note Due on Visit 10    OT Start Time 0900    OT Stop Time 0946    OT Time Calculation (min) 46 min    Activity Tolerance Patient tolerated treatment well    Behavior During Therapy [facility] for tasks assessed/performed          Past Medical History:  Diagnosis Date   Cancer (HCC) 2002   prostate   CHF (congestive heart failure) (HCC)    Gout    Hypertension    Stroke (HCC)    Thyroid  disease    Past Surgical History:  Procedure Laterality Date   COLONOSCOPY N/A 07/15/2017   Procedure: COLONOSCOPY;  Surgeon: Golda Claudis PENNER, MD;  Location: [facility];  Service: Endoscopy;  Laterality: N/A;  830   IR CT HEAD LTD  02/14/2023   IR PERCUTANEOUS ART THROMBECTOMY/INFUSION INTRACRANIAL INC DIAG ANGIO  02/14/2023   LOOP RECORDER INSERTION N/A 02/20/2023   Procedure: LOOP RECORDER INSERTION;  Surgeon: Fernande Elspeth BROCKS, MD;  Location: [facility] INVASIVE CV LAB;  Service: Cardiovascular;  Laterality: N/A;   POLYPECTOMY  07/15/2017   Procedure: POLYPECTOMY;  Surgeon: Golda Claudis PENNER, MD;  Location: [facility];  Service: Endoscopy;;  colon   PROSTATE SURGERY     RADIOLOGY WITH ANESTHESIA Left 02/14/2023   Procedure: IR WITH ANESTHESIA;  Surgeon: Radiologist, Medication, MD;  Location: [facility] OR;  Service: Radiology;  Laterality: Left;   RADIOLOGY WITH ANESTHESIA N/A 02/14/2023   Procedure: IR WITH ANESTHESIA;  Surgeon: Radiologist, Medication, MD;  Location: [facility] OR;  Service: Radiology;  Laterality: N/A;   Patient Active Problem List    Diagnosis Date Noted   Cognitive change 03/17/2023   Hyponatremia 03/13/2023   Pulmonary embolus (HCC) 03/05/2023   Acute deep vein thrombosis (DVT) of popliteal vein of left lower extremity (HCC) 03/05/2023   Chronic heart failure with mildly reduced ejection fraction (HFmrEF, 41-49%) (HCC) 03/05/2023   AKI (acute kidney injury) 03/05/2023   Precordial pain 03/03/2023   Shortness of breath 03/03/2023   Benign hypertension 03/03/2023   Heart failure with improved ejection fraction (HFimpEF) (HCC) 03/03/2023   Coping style affecting medical condition 02/27/2023   Acute right PCA stroke (HCC) 02/21/2023   Acute ischemic right PCA stroke (HCC) 02/14/2023   Posterior cerebral artery embolism, right 02/14/2023   Special screening for malignant neoplasms, colon 04/09/2017   Dizziness 03/29/2013   Cerebellar stroke, acute (HCC) 03/29/2013   CHF (congestive heart failure) (HCC) 03/28/2013   Vertigo 03/28/2013   PCP: Dr. Norleen Hurst  REFERRING PROVIDER: Dr. Murray Collier  ONSET DATE: 02/2023  REFERRING DIAG: P36.468 (ICD-10-CM) - Acute right PCA stroke (HCC)   THERAPY DIAG:  No diagnosis found.  Rationale for Evaluation and Treatment: Rehabilitation  SUBJECTIVE:   SUBJECTIVE STATEMENT: S: I keep forgetting to do my homework  PERTINENT HISTORY: 02/14/2023; woke up could not move; stroke; [name] and then CIR; then had home health and now  referred to outpatient; he has an AFO but not wearing; states it does not fit. Had a WC but sent that back as he was not using it.  Steps most challenging thing for him; no handrails for basement steps. States he is supposed to be having a surgery on his neck.  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  Has washer and dryer in basement; wood stove.   PRECAUTIONS: Fall  WEIGHT BEARING  RESTRICTIONS: No  PAIN:  Are you having pain? No  FALLS: Has patient fallen in last 6 months? No  PLOF: Independent  PATIENT GOALS: To be able to use the left hand to help with things.   OBJECTIVE:  Note: Objective measures were completed at Evaluation unless otherwise noted.  HAND DOMINANCE: Right  ADLs: Overall ADLs: Pt reports difficulty with holding items in the left hand, drops a lot of things. Pt reports he needs assistance with tying shoes, operating buttons. Pt has difficulty with cutting food when trying to hold the fork in his left hand.   FUNCTIONAL OUTCOME MEASURES: Quick Dash:  QUICK DASH  Please rate your ability do the following activities in the last week by selecting the number below the appropriate response.   Activities Rating  Open a tight or new jar.  5 = Unable  Do heavy household chores (e.g., wash walls, floors). 5 = Unable  Carry a shopping bag or briefcase 4 = Severe difficulty  Wash your back. 5 = Unable  Use a knife to cut food. 2 = Mild difficulty  Recreational activities in which you take some force or impact through your arm, shoulder or hand (e.g., golf, hammering, tennis, etc.). 5 = Unable  During the past week, to what extent has your arm, shoulder or hand problem interfered with your normal social activities with family, friends, neighbors or groups?  2 = Slightly  During the past week, were you limited in your work or other regular daily activities as a result of your arm, shoulder or hand problem? 3 = Moderately limited  Rate the severity of the following symptoms in the last week: Arm, Shoulder, or hand pain. 4 = Severe  Rate the severity of the following symptoms in the last week: Tingling (pins and needles) in your arm, shoulder or hand. 2 = Mild  During the past week, how much difficulty have you had sleeping because of the pain in your arm, shoulder or hand?  2 = Mild difficulty   (A QuickDASH score may not be calculated if there is  greater than 1 missing item.)  Quick Dash Disability/Symptom Score: [(sum of 39 (n) responses/11 (n)] -1 x 25 = 63.63  Minimally Clinically Important Difference (MCID): 15-20 points  (Franchignoni, F. et al. (2013). Minimally clinically important difference of the disabilities of the arm, shoulder, and hand outcome measures (DASH) and its shortened version (Quick DASH). Journal of Orthopaedic & Sports Physical Therapy, 44(1), 30-39)    UPPER EXTREMITY ROM:    Active ROM Left eval  Shoulder flexion 92  Shoulder abduction 95  Shoulder internal rotation 90  Shoulder external rotation 34  Wrist flexion WFL  Wrist extension WFL  Wrist ulnar deviation WFL  Wrist radial deviation WFL  Wrist pronation WFL  Wrist supination WFL  (Blank rows = not tested)  UPPER EXTREMITY MMT:  MMT Left eval  Shoulder flexion 4/5  Shoulder abduction 4/5  Shoulder internal rotation 5/5  Shoulder external rotation 4-/5  Elbow flexion 5/5  Elbow extension 4+/5  Wrist flexion 5/5  Wrist extension 5/5  Wrist ulnar deviation 5/5  Wrist radial deviation 5/5  Wrist pronation 5/5  Wrist supination 5/5  (Blank rows = not tested)  HAND FUNCTION: Grip strength: Right: 65 lbs; Left: 50 lbs, Lateral pinch: Right: 19 lbs, Left: 15 lbs, and 3 point pinch: Right: 12 lbs, Left: 12 lbs  COORDINATION: 9 Hole Peg test: Right: 33.9 sec; Left: 1'53 sec  SENSATION: [place] Speed: 2.83-3.22=normal to decreased light touch sensation  EDEMA: None  COGNITION: Overall cognitive status: Within functional limits for tasks assessed  VISION: Subjective report: residual double vision Baseline vision: Wears glasses all the time Visual history: glaucoma, cataracts, and had cataract surgery ~2006                                                                                                                             TREATMENT DATE:  01/25/24 -Strengthening: 2# dumbbells, protraction, shoulder flexion,  abduction, horizontal abduction, er/IR, hammer curls, bicep curls, x10 -Wrist Strengthening: 2# dumbbells, flexion, extension, ulnar/radial deviation, supination/pronation, x10 -Grooved peg board -Therabar: teal, extension/flexion twists, pronated and supinated bends, x10  01/22/24 -Shoulder strengthening: sitting, 1# wrist weights-protraction, flexion, abduction, horizontal abduction, er, 10 reps -ABC writing: shoulder at 90 degrees flexion -Sponges: 13, 18, 20 -Pinch tree: pt reaching into flexion to place clothespins along vertical bar of pinch tree. Pt using lateral pinch to place clothespins, placing 12 pins total. Using 3 point pinch to remove. Increased time for task. -Grip strengthening: large and medium beads at 42# with gripper horizontal   01/19/24 -Sponges: 11, 10, 11 -Small pegboard: pt placing pegs one at a time into small pegboard with left hand. Mod difficulty picking up pegs in correct position for placement, increased time required. Reminders to relax shoulders as well. Pt then removing pegs and holding in palm while removing all pegs, did not drop any pegs.  -Grip strengthening: large and medium beads at 42# with gripper horizontal  -Theraputty: red-flatten, using pvc pipe to cut circles into putty, rolling, gripping, 3 point pinch   01/11/24 -P/ROM: supine-flexion, abduction, horizontal abduction, er, 5 reps -A/ROM: supine-protraction, flexion, abduction, er, horizontal abduction, 10 reps -Proximal shoulder strengthening: supine-paddles, criss cross, circles each direction, 10 reps -Boom wackers:  -Pt alternating between colored boom wackers, OT calling color and pt hitting with his boomwacker  -OT holding boom wackers still, pt working on speed and fluidity going back and forth between the two colors.   Completed in 4 positions, side to side at chest height, up and down at chest height, and PNF patterns at chest   height. Good speed with side to side and up/down, mod  difficulty with PNF patterns -Grip strengthening: large and medium beads at 42# with gripper horizontal  -Functional reaching: pt standing at  tall mirror, reaching overhead to place pegs in top line of large pegboard. Placing 10 pegs with 2 rest breaks -Sponges: 9, 12, 12    PATIENT EDUCATION: Education details: reviewed HEP Person educated: Patient Education method: Explanation, Demonstration, and Handouts Education comprehension: verbalized understanding and returned demonstration  HOME EXERCISE PROGRAM: Eval: finger A/ROM   GOALS: Goals reviewed with patient? Yes  SHORT TERM GOALS: Target date: 01/29/24  Pt will be provided with and educated on HEP to improve ability to use left hand during ADLs and housework tasks.   Goal status: IN PROGRESS  2.  Pt will be educated on strategies to improve ability to hug people and remove arm without brushing against people inappropriately.   Goal status: IN PROGRESS  3.  Pt will improve fine motor coordination in LUE required for operating buttons and manipulating small objects by completing 9 hole peg test in under 1'.   Goal status: IN PROGRESS  4.  Pt will increase left hand grip strength by 8# to improve ability to grasp and move items such as a backpack or grocery bag.   Goal status: IN PROGRESS  5.  Pt will increase LUE A/ROM by 20 degrees or greater to improve ability to perform functional reaching required for bathing, dressing, and reaching behind back.   Goal status: IN PROGRESS  6.  Pt will increase LUE strength for shoulder flexion and abduction to 4+/5 or greater to improve ability to perform functional reaching tasks above shoulder height.   Goal status: IN PROGRESS  7.  Pt will improve bilateral coordination and motor planning required for operating utensils by reporting independence with cutting daily foods.   Goal status: IN PROGRESS  ASSESSMENT:  CLINICAL IMPRESSION: This session pt continued to work on his  overall strength and motor planning. While completing his strengthening exercises, he requires frequent breaks due to fatigue, however he was able to increase to 2# this session from the previous 1#'s. Pt also working on his coordination this session, where he struggled with in hand translation. Pt reports difficulty with completing HEP due to other stressors and forgetting the exercises, however he reports he is walking more and trying to complete more typical activities independently now. OT providing education and encouragement on completing HEP, as well as verbal and visual cuing for positioning and technique throughout session.  PERFORMANCE DEFICITS: in functional skills including ADLs, IADLs, coordination, dexterity, tone, ROM, strength, and UE functional use   PLAN:  OT FREQUENCY: 2x/week  OT DURATION: 4 weeks  PLANNED INTERVENTIONS: 97168 OT Re-evaluation, 97535 self care/ADL training, 02889 therapeutic exercise, 97530 therapeutic activity, 97112 neuromuscular re-education, 97140 manual therapy, patient/family education, and DME and/or AE instructions  CONSULTED AND AGREED WITH PLAN OF CARE: Patient  PLAN FOR NEXT SESSION: Follow up on HEP, initiate motor planning, coordination, and strengthening work   Valentin Nightingale, OTR/L [phone] 01/25/2024, 10:30 AM        "

## 2024-01-25 NOTE — Therapy (Signed)
 " OUTPATIENT PHYSICAL THERAPY LOWER EXTREMITY TREATMENT      Patient Name: OMRI BERTRAN MRN: [id number] DOB:1950-05-11, 74 y.o., male Today's Date: 01/25/2024  END OF SESSION:  PT End of Session - 01/25/24 1000     Visit Number 13    Number of Visits 17    Date for Recertification  02/09/24    Authorization Type [employer] Medicare    Authorization Time Period [employer] Med approved 12 visits from 01/15/2024-02/26/2024    Authorization - Visit Number 4    Authorization - Number of Visits 12    Progress Note Due on Visit 19    PT Start Time 1001    PT Stop Time 1041    PT Time Calculation (min) 40 min    Equipment Utilized During Treatment Gait belt    Activity Tolerance Patient tolerated treatment well    Behavior During Therapy WFL for tasks assessed/performed             Past Medical History:  Diagnosis Date   Cancer (HCC) 2002   prostate   CHF (congestive heart failure) (HCC)    Gout    Hypertension    Stroke (HCC)    Thyroid  disease    Past Surgical History:  Procedure Laterality Date   COLONOSCOPY N/A 07/15/2017   Procedure: COLONOSCOPY;  Surgeon: Golda Claudis PENNER, MD;  Location: [facility];  Service: Endoscopy;  Laterality: N/A;  830   IR CT HEAD LTD  02/14/2023   IR PERCUTANEOUS ART THROMBECTOMY/INFUSION INTRACRANIAL INC DIAG ANGIO  02/14/2023   LOOP RECORDER INSERTION N/A 02/20/2023   Procedure: LOOP RECORDER INSERTION;  Surgeon: Fernande Elspeth BROCKS, MD;  Location: [facility] INVASIVE CV LAB;  Service: Cardiovascular;  Laterality: N/A;   POLYPECTOMY  07/15/2017   Procedure: POLYPECTOMY;  Surgeon: Golda Claudis PENNER, MD;  Location: [facility];  Service: Endoscopy;;  colon   PROSTATE SURGERY     RADIOLOGY WITH ANESTHESIA Left 02/14/2023   Procedure: IR WITH ANESTHESIA;  Surgeon: Radiologist, Medication, MD;  Location: [facility];  Service: Radiology;  Laterality: Left;   RADIOLOGY WITH ANESTHESIA N/A 02/14/2023   Procedure: IR WITH ANESTHESIA;  Surgeon: Radiologist, Medication, MD;   Location: [facility];  Service: Radiology;  Laterality: N/A;   Patient Active Problem List   Diagnosis Date Noted   Cognitive change 03/17/2023   Hyponatremia 03/13/2023   Pulmonary embolus (HCC) 03/05/2023   Acute deep vein thrombosis (DVT) of popliteal vein of left lower extremity (HCC) 03/05/2023   Chronic heart failure with mildly reduced ejection fraction (HFmrEF, 41-49%) (HCC) 03/05/2023   AKI (acute kidney injury) 03/05/2023   Precordial pain 03/03/2023   Shortness of breath 03/03/2023   Benign hypertension 03/03/2023   Heart failure with improved ejection fraction (HFimpEF) (HCC) 03/03/2023   Coping style affecting medical condition 02/27/2023   Acute right PCA stroke (HCC) 02/21/2023   Acute ischemic right PCA stroke (HCC) 02/14/2023   Posterior cerebral artery embolism, right 02/14/2023   Special screening for malignant neoplasms, colon 04/09/2017   Dizziness 03/29/2013   Cerebellar stroke, acute (HCC) 03/29/2013   CHF (congestive heart failure) (HCC) 03/28/2013   Vertigo 03/28/2013    PCP: Shona Norleen PEDLAR, MD  REFERRING PROVIDER: [name], MD  REFERRING DIAG: [phone] (ICD-10-CM) - Acute right PCA stroke (HCC)  THERAPY DIAG:  Other symptoms and signs involving the nervous system  Other lack of coordination  Left-sided weakness  Difficulty in walking, not elsewhere classified  Other abnormalities of gait and mobility  Rationale for Evaluation and Treatment: Rehabilitation  ONSET DATE: 02/14/2023  SUBJECTIVE:   SUBJECTIVE STATEMENT: Reports no pain today; no new falls or issues reported  Eval:02/14/2023; woke up could not move; stroke; [place] and then CIR; then had home health and now referred to outpatient; he has an AFO but not wearing; states it does not fit; asked him to bring in next visit.  Had a WC but sent that back as he was not using it.  Steps most challenging thing for him; no handrails for basement steps. States he is supposed to be having a  surgery on his neck?  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   Washer and dryer in basement; wood stove  PERTINENT HISTORY: CHF, HTN, gout, stroke PAIN:  Are you having pain? Yes: NPRS scale: 7/10 Pain location: left upper trap and shoulder Pain description: sharp Aggravating factors: turning head to left Relieving factors: lidocaine  patch  PRECAUTIONS: Fall  RED FLAGS: None   WEIGHT BEARING RESTRICTIONS: No  FALLS:  Has patient fallen in last 6 months? Yes. Number of falls 2; at hospital slid out of WC  LIVING ENVIRONMENT: Lives with: lives with their spouse Lives in: House/apartment Stairs: Yes: Internal: 10 steps; none Has following equipment at home: Vannie - 2 wheeled, Grab bars, and Ramped entry  OCCUPATION: retired neurosurgeon  PLOF: Independent with household mobility with device and wife assists with buttons and sometimes with bathing  PATIENT GOALS: to walk like I used to; do steps better  NEXT MD VISIT: next month  OBJECTIVE:  Note: Objective measures were completed at Evaluation unless otherwise noted.  DIAGNOSTIC FINDINGS: I63.531 (ICD-10-CM) - Acute right PCA stroke (HCC)  PATIENT SURVEYS:  LEFS  Extreme difficulty/unable (0), Quite a bit of difficulty (1), Moderate difficulty (2), Little difficulty (3), No difficulty (4) Survey date:    Any of your usual work, housework or school activities   2. Usual hobbies, recreational or sporting activities   3. Getting into/out of the bath   4. Walking between rooms   5. Putting on socks/shoes   6. Squatting    7. Lifting an object, like a bag of groceries from the floor   8. Performing light activities around your home   9. Performing heavy activities around your home   10. Getting into/out of a car   11. Walking 2 blocks   12. Walking 1  mile   13. Going up/down 10 stairs (1 flight)   14. Standing for 1 hour   15.  sitting for 1 hour   16. Running on even ground   17. Running on uneven ground   18. Making sharp turns while running fast   19. Hopping    20. Rolling over in bed   Score total:  21/80; 26.3%     COGNITION: Overall cognitive status: Within functional limits for tasks assessed     SENSATION:   EDEMA:  None noted  MUSCLE LENGTH: Hamstrings:   POSTURE: flexed trunk   PALPATION:   LOWER EXTREMITY MMT:  MMT Right eval Left eval Left 01/12/24  Hip flexion 4+ 4 4+  Hip extension     Hip abduction     Hip adduction     Hip internal rotation     Hip external rotation  Knee flexion     Knee extension 5 4+ 5  Ankle dorsiflexion 5 4 4   Ankle plantarflexion     Ankle inversion     Ankle eversion      (Blank rows = not tested)   FUNCTIONAL TESTS:  5 times sit to stand: 1.02.43 sec using hands to push up to standing 2 minute walk test: 125 ft with rollator SLS right 7 and left 4  GAIT: Distance walked: 125 ft Assistive device utilized: [employer]ant - 4 wheeled Level of assistance: Modified independence Comments: slight toe drop left; decreased gait speed                                                                                                                                TREATMENT DATE:  01/25/24 Nustep seat 11 level 3 dynamic warm up x 5' 6 step ups 2 x 10 leading with left leg leading; bilateral UE support Sit to stand with 1 UE assist 2 x 5 6 lateral step ups 2 x 10 with bilateral UE assist 4# hip abduction 2 x 10 4# hip extension 2 x 10 4# marching 2 x 10      01/22/2024  Therapeutic Exercise: -Treadmill, 5 minutes, grade 2.0 incline, pt cued for normal pace, speed 1.0>1.2 Neuromuscular Re-education: -Rocker board, 3 sets of 10 DF/PF, 2 sets of 10 reps, IN/EV, pt cued for decreased UE support, requires mod assist for maintaining balance  -Cone marches, with 4lb  ankle weights, 3 sets of 10 reps bilaterally, pt knocks cone over twice with LLE Therapeutic Activity: -Speed step ups, 4lb ankle weights, 8 inch step, 2 bouts of 30 seconds, pt obtains about 7.5 to 8 reps per bout, pt cued for decreased UE support -Lateral step up and overs, 4lb ankle weights, 8 inch step, pt cued for decreased UE support  01/19/24: Treadmill grade 2.0 .8-->1.0 x 5 min Quadruped: -Bird dog 10x 5 Tall kneeling: - UE flexion 10x Standing: - Stairs navigation 3RT reciprocal pattern with 2HR A  - Toe raises decline slope 20x   01/15/24: Nustep seat 11 level 3 x 5' dynamic warm up UE/LE, SPM goal Quadruped crawl forward/backward on big mat 3RT Tall kneeling UE flexion 10x Quadruped UE flexion 10x Quadruped LE extension 10x alternating Heel tapping alternating 6in 20x initially BUE, 1UE then none Lateral heel tapping between 6 and 8in steps BUE, 1UE then none 20x  PATIENT EDUCATION:  Education details: Patient educated on exam findings, POC, scope of PT, HEP, and what to expect next visit. Person educated: Patient Education method: Explanation, Demonstration, and Handouts Education comprehension: verbalized understanding, returned demonstration, verbal cues required, and tactile cues required  HOME EXERCISE PROGRAM: Access Code: [email or web address] URL: [email or web address] Date: 12/03/2023 Prepared by: [facility]  Exercises - Sit to Stand with Armchair  - 2 x daily - 7 x weekly - 1 sets - 5 reps - Standing Ankle Plantar Flexion Dorsiflexion with Counter  Support  - 2 x daily - 7 x weekly - 2 sets - 10 reps  01/15/24: - Bird Dog  - 2 x daily - 7 x weekly - 2 sets - 10 reps - 5 hold  ASSESSMENT:  CLINICAL IMPRESSION: Patient continues to demonstrate decreased LLE strength, decreased gait quality and balance. Focus on left lower extremity strengthening today.  Has a hard time with clearing left foot for controlled step down with both front and lateral step ups.   Needs continued cues with hip exercises to avoid trunk substitution especially on the left.  Needs cueing to look ahead to maintain good posturing with his head; still tends to flex his neck to the left side.   Patient would continue to benefit from skilled physical therapy for increased endurance with ambulation, increased LLE strength, and improved balance for improved quality of life, improved independence with gait training and continued progress towards therapy goals.   Eval:Patient is a 74 y.o. male who was seen today for physical therapy evaluation and treatment for I63.531 (ICD-10-CM) - Acute right PCA stroke (HCC). Patient demonstrates decreased strength, balance deficits and gait abnormalities which are negatively impacting patient ability to perform ADLs and functional mobility tasks. Patient will benefit from skilled physical therapy services to address these deficits to improve level of function with ADLs, functional mobility tasks, and reduce risk for falls.    OBJECTIVE IMPAIRMENTS: Abnormal gait, increased fascial restrictions, impaired perceived functional ability, and pain.   ACTIVITY LIMITATIONS: carrying, lifting, bending, standing, squatting, stairs, and locomotion level  PARTICIPATION LIMITATIONS: meal prep, cleaning, laundry, driving, shopping, and occupation  REHAB POTENTIAL: Good  CLINICAL DECISION MAKING: Evolving/moderate complexity  EVALUATION COMPLEXITY: Moderate   GOALS: Goals reviewed with patient? No  SHORT TERM GOALS: Target date: 12/24/2023 patient will be independent with initial HEP and compliant with HEP 3-4 times a week   Baseline: Goal status: in progress  2.  Patient will report 30% improvement overall  Baseline:  Goal status: met   LONG TERM GOALS: Target date: 02/09/2024  Patient will be independent in self management strategies to improve quality of life and functional outcomes.  Baseline:  Goal status: in progress  2.  Patient will  report 50% improvement overall  Baseline:  Goal status: met  3.  Patient will improve LEFS score by 9 points to demonstrate improved perceived function  Baseline: 21/80; 43/80 01/12/24 Goal status: met  4.  Patient will increase distance on 2 MWT to 225 ft or more with LRAD to demonstrate improved speed and efficiency with household and community ambulation  Baseline: 125 fit with rollator; 184 ft with rollator 01/12/24 Goal status: in progress  5.  Patient will improve 5 times sit to stand score to 30 sec or less to demonstrate improved functional mobility and increased leg strength.  Baseline: 1 min and 2.45 sec; 30.88 sec 01/12/24 Goal status: in progress    PLAN:  PT FREQUENCY: 1-2x/week  PT DURATION: 4 weeks  PLANNED INTERVENTIONS: 97164- PT Re-evaluation, 97110-Therapeutic exercises, 97530- Therapeutic activity, 97112- Neuromuscular re-education, 97535- Self Care, [id number]- Manual therapy, [phone]- Gait training, [phone]- Orthotic Fit/training, [phone]- Canalith repositioning, [id number]- Aquatic Therapy, [phone]- Splinting, [phone]- Wound care (first 20 sq cm), 97598- Wound care (each additional 20 sq cm)Patient/Family education, Balance training, Stair training, Taping, Dry Needling, Joint mobilization, Joint manipulation, Spinal manipulation, Spinal mobilization, Scar mobilization, and DME instructions.   PLAN FOR NEXT SESSION:  Progress LE functional strength and stabilization; balance and gait;  Begin floor to  standing, stair training and continue gait training on treadmill  10:36 AM, 01/25/2024 Foy Vanduyne Small Breelle Hollywood MPT [facility] [place] [phone] Ph:[phone]       "

## 2024-01-25 NOTE — Patient Instructions (Signed)
Strengthening Exercises  1) WRIST EXTENSION CURLS - TABLE  Hold a small free weight, rest your forearm on a table and bend your wrist up and down with your palm face down as shown.      2) WRIST FLEXION CURLS - TABLE  Hold a small free weight, rest your forearm on a table and bend your wrist up and down with your palm face up as shown.     3) FREE WEIGHT RADIAL/ULNAR DEVIATION - TABLE  Hold a small free weight, rest your forearm on a table and bend your wrist up and down with your palm facing towards the side as shown.     4) Pronation  Forearm supported on table with wrist in neutral position. Using a weight, roll wrist so that palm faces downward. Hold for 2 seconds and return to starting position.     5) Supination  Forearm supported on table with wrist in neutral position. Using a weight, roll wrist so that palm is now facing upward. Hold for 2 seconds and return to starting position.      *Complete exercises using _2-3___ pound weight, __10-15__times each, __1-2__times per day*

## 2024-01-27 ENCOUNTER — Encounter (HOSPITAL_COMMUNITY): Payer: Self-pay | Admitting: Occupational Therapy

## 2024-01-27 ENCOUNTER — Ambulatory Visit (HOSPITAL_COMMUNITY): Admitting: Occupational Therapy

## 2024-01-27 ENCOUNTER — Ambulatory Visit (HOSPITAL_COMMUNITY)

## 2024-01-27 DIAGNOSIS — R531 Weakness: Secondary | ICD-10-CM

## 2024-01-27 DIAGNOSIS — R29818 Other symptoms and signs involving the nervous system: Secondary | ICD-10-CM

## 2024-01-27 DIAGNOSIS — R262 Difficulty in walking, not elsewhere classified: Secondary | ICD-10-CM

## 2024-01-27 DIAGNOSIS — R278 Other lack of coordination: Secondary | ICD-10-CM | POA: Diagnosis not present

## 2024-01-27 DIAGNOSIS — R2689 Other abnormalities of gait and mobility: Secondary | ICD-10-CM

## 2024-01-27 NOTE — Therapy (Signed)
 " OUTPATIENT PHYSICAL THERAPY LOWER EXTREMITY TREATMENT      Patient Name: Edward Rodgers MRN: [id number] DOB:Mar 16, 1950, 74 y.o., male Today's Date: 01/27/2024  END OF SESSION:  PT End of Session - 01/27/24 1118     Visit Number 14    Number of Visits 17    Date for Recertification  02/09/24    Authorization Type [employer] Medicare    Authorization Time Period [employer] Med approved 12 visits from 01/15/2024-02/26/2024    Authorization - Visit Number 5    Authorization - Number of Visits 12    Progress Note Due on Visit 19    PT Start Time 1116    PT Stop Time 1154    PT Time Calculation (min) 38 min    Equipment Utilized During Treatment Gait belt    Activity Tolerance Patient tolerated treatment well    Behavior During Therapy WFL for tasks assessed/performed             Past Medical History:  Diagnosis Date   Cancer (HCC) 2002   prostate   CHF (congestive heart failure) (HCC)    Gout    Hypertension    Stroke (HCC)    Thyroid  disease    Past Surgical History:  Procedure Laterality Date   COLONOSCOPY N/A 07/15/2017   Procedure: COLONOSCOPY;  Surgeon: Golda Claudis PENNER, MD;  Location: [facility];  Service: Endoscopy;  Laterality: N/A;  830   IR CT HEAD LTD  02/14/2023   IR PERCUTANEOUS ART THROMBECTOMY/INFUSION INTRACRANIAL INC DIAG ANGIO  02/14/2023   LOOP RECORDER INSERTION N/A 02/20/2023   Procedure: LOOP RECORDER INSERTION;  Surgeon: Fernande Elspeth BROCKS, MD;  Location: [facility] INVASIVE CV LAB;  Service: Cardiovascular;  Laterality: N/A;   POLYPECTOMY  07/15/2017   Procedure: POLYPECTOMY;  Surgeon: Golda Claudis PENNER, MD;  Location: [facility];  Service: Endoscopy;;  colon   PROSTATE SURGERY     RADIOLOGY WITH ANESTHESIA Left 02/14/2023   Procedure: IR WITH ANESTHESIA;  Surgeon: Radiologist, Medication, MD;  Location: [facility] OR;  Service: Radiology;  Laterality: Left;   RADIOLOGY WITH ANESTHESIA N/A 02/14/2023   Procedure: IR WITH ANESTHESIA;  Surgeon: Radiologist, Medication, MD;   Location: [facility] OR;  Service: Radiology;  Laterality: N/A;   Patient Active Problem List   Diagnosis Date Noted   Cognitive change 03/17/2023   Hyponatremia 03/13/2023   Pulmonary embolus (HCC) 03/05/2023   Acute deep vein thrombosis (DVT) of popliteal vein of left lower extremity (HCC) 03/05/2023   Chronic heart failure with mildly reduced ejection fraction (HFmrEF, 41-49%) (HCC) 03/05/2023   AKI (acute kidney injury) 03/05/2023   Precordial pain 03/03/2023   Shortness of breath 03/03/2023   Benign hypertension 03/03/2023   Heart failure with improved ejection fraction (HFimpEF) (HCC) 03/03/2023   Coping style affecting medical condition 02/27/2023   Acute right PCA stroke (HCC) 02/21/2023   Acute ischemic right PCA stroke (HCC) 02/14/2023   Posterior cerebral artery embolism, right 02/14/2023   Special screening for malignant neoplasms, colon 04/09/2017   Dizziness 03/29/2013   Cerebellar stroke, acute (HCC) 03/29/2013   CHF (congestive heart failure) (HCC) 03/28/2013   Vertigo 03/28/2013    PCP: Shona Norleen PEDLAR, MD  REFERRING PROVIDER: [name], MD  REFERRING DIAG: [phone] (ICD-10-CM) - Acute right PCA stroke (HCC)  THERAPY DIAG:  Other lack of coordination  Other symptoms and signs involving the nervous system  Left-sided weakness  Difficulty in walking, not elsewhere classified  Other abnormalities of gait and mobility  Rationale for Evaluation and Treatment: Rehabilitation  ONSET DATE: 02/14/2023  SUBJECTIVE:   SUBJECTIVE STATEMENT: No pain today.  Waiting on his friend to put up hand railing for basement steps; currently goes outside and walks into basement door; states he is awaiting neck surgery coming up in the future.    Eval:02/14/2023; woke up could not move; stroke; [name] and then CIR; then had home health and now referred to outpatient; he has an AFO but not wearing; states it does not fit; asked him to bring in next visit.  Had a WC but sent that  back as he was not using it.  Steps most challenging thing for him; no handrails for basement steps. States he is supposed to be having a surgery on his neck?  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   Washer and dryer in basement; wood stove  PERTINENT HISTORY: CHF, HTN, gout, stroke PAIN:  Are you having pain? Yes: NPRS scale: 7/10 Pain location: left upper trap and shoulder Pain description: sharp Aggravating factors: turning head to left Relieving factors: lidocaine  patch  PRECAUTIONS: Fall  RED FLAGS: None   WEIGHT BEARING RESTRICTIONS: No  FALLS:  Has patient fallen in last 6 months? Yes. Number of falls 2; at hospital slid out of WC  LIVING ENVIRONMENT: Lives with: lives with their spouse Lives in: House/apartment Stairs: Yes: Internal: 10 steps; none Has following equipment at home: Vannie - 2 wheeled, Grab bars, and Ramped entry  OCCUPATION: retired neurosurgeon  PLOF: Independent with household mobility with device and wife assists with buttons and sometimes with bathing  PATIENT GOALS: to walk like I used to; do steps better  NEXT MD VISIT: next month  OBJECTIVE:  Note: Objective measures were completed at Evaluation unless otherwise noted.  DIAGNOSTIC FINDINGS: I63.531 (ICD-10-CM) - Acute right PCA stroke (HCC)  PATIENT SURVEYS:  LEFS  Extreme difficulty/unable (0), Quite a bit of difficulty (1), Moderate difficulty (2), Little difficulty (3), No difficulty (4) Survey date:    Any of your usual work, housework or school activities   2. Usual hobbies, recreational or sporting activities   3. Getting into/out of the bath   4. Walking between rooms   5. Putting on socks/shoes   6. Squatting    7. Lifting an object, like a bag of groceries from the floor   8. Performing light activities around  your home   9. Performing heavy activities around your home   10. Getting into/out of a car   11. Walking 2 blocks   12. Walking 1 mile   13. Going up/down 10 stairs (1 flight)   14. Standing for 1 hour   15.  sitting for 1 hour   16. Running on even ground   17. Running on uneven ground   18. Making sharp turns while running fast   19. Hopping    20. Rolling over in bed   Score total:  21/80; 26.3%     COGNITION: Overall cognitive status: Within functional limits for tasks assessed     SENSATION:   EDEMA:  None noted  MUSCLE LENGTH: Hamstrings:   POSTURE: flexed trunk   PALPATION:   LOWER EXTREMITY MMT:  MMT Right eval Left eval Left 01/12/24  Hip flexion 4+ 4 4+  Hip extension  Hip abduction     Hip adduction     Hip internal rotation     Hip external rotation     Knee flexion     Knee extension 5 4+ 5  Ankle dorsiflexion 5 4 4   Ankle plantarflexion     Ankle inversion     Ankle eversion      (Blank rows = not tested)   FUNCTIONAL TESTS:  5 times sit to stand: 1.02.43 sec using hands to push up to standing 2 minute walk test: 125 ft with rollator SLS right 7 and left 4  GAIT: Distance walked: 125 ft Assistive device utilized: Environmental Consultant - 4 wheeled Level of assistance: Modified independence Comments: slight toe drop left; decreased gait speed                                                                                                                                TREATMENT DATE:  01/27/24 Sit to stand from low mat table x 5 no UE assist; 2 x 5 with holding yellow med ball Elevated mat table up to 22.5 inches; sit to stand with holding sphere tidal tank 2 x 5 Standing: Boom stick in right hand hitting target (another boom stick PT is holding and moving); feet even x 15 reps then stagger stance each way x 15 reps each 1 lap PT gym with rollator Nustep seat 11 x 5' to end treatment   01/25/24 Nustep seat 11 level 3 dynamic warm up x  5' 6 step ups 2 x 10 leading with left leg leading; bilateral UE support Sit to stand with 1 UE assist 2 x 5 6 lateral step ups 2 x 10 with bilateral UE assist 4# hip abduction 2 x 10 4# hip extension 2 x 10 4# marching 2 x 10      01/22/2024  Therapeutic Exercise: -Treadmill, 5 minutes, grade 2.0 incline, pt cued for normal pace, speed 1.0>1.2 Neuromuscular Re-education: -Rocker board, 3 sets of 10 DF/PF, 2 sets of 10 reps, IN/EV, pt cued for decreased UE support, requires mod assist for maintaining balance  -Cone marches, with 4lb ankle weights, 3 sets of 10 reps bilaterally, pt knocks cone over twice with LLE Therapeutic Activity: -Speed step ups, 4lb ankle weights, 8 inch step, 2 bouts of 30 seconds, pt obtains about 7.5 to 8 reps per bout, pt cued for decreased UE support -Lateral step up and overs, 4lb ankle weights, 8 inch step, pt cued for decreased UE support  01/19/24: Treadmill grade 2.0 .8-->1.0 x 5 min Quadruped: -Bird dog 10x 5 Tall kneeling: - UE flexion 10x Standing: - Stairs navigation 3RT reciprocal pattern with 2HR A  - Toe raises decline slope 20x   01/15/24: Nustep seat 11 level 3 x 5' dynamic warm up UE/LE, SPM goal Quadruped crawl forward/backward on big mat 3RT Tall kneeling UE flexion 10x Quadruped UE flexion 10x Quadruped LE extension 10x alternating Heel tapping alternating  6in 20x initially BUE, 1UE then none Lateral heel tapping between 6 and 8in steps BUE, 1UE then none 20x  PATIENT EDUCATION:  Education details: Patient educated on exam findings, POC, scope of PT, HEP, and what to expect next visit. Person educated: Patient Education method: Explanation, Demonstration, and Handouts Education comprehension: verbalized understanding, returned demonstration, verbal cues required, and tactile cues required  HOME EXERCISE PROGRAM: Access Code: [email or web address] URL: [email or web address] Date: 12/03/2023 Prepared by: [facility] -  [facility]hab  Exercises - Sit to Stand with Armchair  - 2 x daily - 7 x weekly - 1 sets - 5 reps - Standing Ankle Plantar Flexion Dorsiflexion with Counter Support  - 2 x daily - 7 x weekly - 2 sets - 10 reps  01/15/24: GLENWOOD Chang Dog  - 2 x daily - 7 x weekly - 2 sets - 10 reps - 5 hold  ASSESSMENT:  CLINICAL IMPRESSION: Patient continues to demonstrate decreased LLE strength, decreased gait quality and balance. Noted patient has a hard time transferring weight forward for sit to stand; will lean back on his heels on initial standing position.   Has one loss of balance needing PT min A to recover with stagger stance activity with left foot in front.  Patient still tends to drag his left foot during ambulation but improves with cues.    Patient would continue to benefit from skilled physical therapy for increased endurance with ambulation, increased LLE strength, and improved balance for improved quality of life, improved independence with gait training and continued progress towards therapy goals.   Eval:Patient is a 74 y.o. male who was seen today for physical therapy evaluation and treatment for I63.531 (ICD-10-CM) - Acute right PCA stroke (HCC). Patient demonstrates decreased strength, balance deficits and gait abnormalities which are negatively impacting patient ability to perform ADLs and functional mobility tasks. Patient will benefit from skilled physical therapy services to address these deficits to improve level of function with ADLs, functional mobility tasks, and reduce risk for falls.    OBJECTIVE IMPAIRMENTS: Abnormal gait, increased fascial restrictions, impaired perceived functional ability, and pain.   ACTIVITY LIMITATIONS: carrying, lifting, bending, standing, squatting, stairs, and locomotion level  PARTICIPATION LIMITATIONS: meal prep, cleaning, laundry, driving, shopping, and occupation  REHAB POTENTIAL: Good  CLINICAL DECISION MAKING: Evolving/moderate complexity  EVALUATION  COMPLEXITY: Moderate   GOALS: Goals reviewed with patient? No  SHORT TERM GOALS: Target date: 12/24/2023 patient will be independent with initial HEP and compliant with HEP 3-4 times a week   Baseline: Goal status: in progress  2.  Patient will report 30% improvement overall  Baseline:  Goal status: met   LONG TERM GOALS: Target date: 02/09/2024  Patient will be independent in self management strategies to improve quality of life and functional outcomes.  Baseline:  Goal status: in progress  2.  Patient will report 50% improvement overall  Baseline:  Goal status: met  3.  Patient will improve LEFS score by 9 points to demonstrate improved perceived function  Baseline: 21/80; 43/80 01/12/24 Goal status: met  4.  Patient will increase distance on 2 MWT to 225 ft or more with LRAD to demonstrate improved speed and efficiency with household and community ambulation  Baseline: 125 fit with rollator; 184 ft with rollator 01/12/24 Goal status: in progress  5.  Patient will improve 5 times sit to stand score to 30 sec or less to demonstrate improved functional mobility and increased leg strength.  Baseline: 1 min and 2.45 sec; 30.88 sec 01/12/24  Goal status: in progress    PLAN:  PT FREQUENCY: 1-2x/week  PT DURATION: 4 weeks  PLANNED INTERVENTIONS: [id number]- PT Re-evaluation, 97110-Therapeutic exercises, 97530- Therapeutic activity, [id number]- Neuromuscular re-education, 97535- Self Care, [id number]- Manual therapy, [id number]- Gait training, [phone]- Orthotic Fit/training, [phone]- Canalith repositioning, [id number]- Aquatic Therapy, [phone]- Splinting, [phone]- Wound care (first 20 sq cm), 97598- Wound care (each additional 20 sq cm)Patient/Family education, Balance training, Stair training, Taping, Dry Needling, Joint mobilization, Joint manipulation, Spinal manipulation, Spinal mobilization, Scar mobilization, and DME instructions.   PLAN FOR NEXT SESSION:  Progress LE functional strength and  stabilization; balance and gait;  Begin floor to standing, stair training and continue gait training on treadmill  11:58 AM, 01/27/2024 Alira Fretwell Small Kenika Sahm MPT  physical therapy [place] [phone] Ph:[phone]       "

## 2024-01-27 NOTE — Therapy (Signed)
 " OUTPATIENT OCCUPATIONAL THERAPY NEURO TREATMENT NOTE  REASSESSMENT AND RECERTIFICATION  Patient Name: Edward Rodgers MRN: [id number] DOB:1950/09/22, 74 y.o., male Today's Date: 01/27/2024    END OF SESSION:  OT End of Session - 01/27/24 1059     Visit Number 7    Number of Visits 15    Date for Recertification  03/11/24    Authorization Type [employer] Medicare    Authorization Time Period 8 visits approved 12/29/23-02/23/24; requesting additional visits    Authorization - Visit Number 7    Authorization - Number of Visits 8    Progress Note Due on Visit 10    OT Start Time 1040    OT Stop Time 1115    OT Time Calculation (min) 35 min    Activity Tolerance Patient tolerated treatment well    Behavior During Therapy [facility] for tasks assessed/performed           Past Medical History:  Diagnosis Date   Cancer (HCC) 2002   prostate   CHF (congestive heart failure) (HCC)    Gout    Hypertension    Stroke (HCC)    Thyroid  disease    Past Surgical History:  Procedure Laterality Date   COLONOSCOPY N/A 07/15/2017   Procedure: COLONOSCOPY;  Surgeon: Golda Claudis PENNER, MD;  Location: [facility];  Service: Endoscopy;  Laterality: N/A;  830   IR CT HEAD LTD  02/14/2023   IR PERCUTANEOUS ART THROMBECTOMY/INFUSION INTRACRANIAL INC DIAG ANGIO  02/14/2023   LOOP RECORDER INSERTION N/A 02/20/2023   Procedure: LOOP RECORDER INSERTION;  Surgeon: Fernande Elspeth BROCKS, MD;  Location: [facility] INVASIVE CV LAB;  Service: Cardiovascular;  Laterality: N/A;   POLYPECTOMY  07/15/2017   Procedure: POLYPECTOMY;  Surgeon: Golda Claudis PENNER, MD;  Location: [facility];  Service: Endoscopy;;  colon   PROSTATE SURGERY     RADIOLOGY WITH ANESTHESIA Left 02/14/2023   Procedure: IR WITH ANESTHESIA;  Surgeon: Radiologist, Medication, MD;  Location: [facility] OR;  Service: Radiology;  Laterality: Left;   RADIOLOGY WITH ANESTHESIA N/A 02/14/2023   Procedure: IR WITH ANESTHESIA;  Surgeon: Radiologist, Medication, MD;  Location: [facility] OR;   Service: Radiology;  Laterality: N/A;   Patient Active Problem List   Diagnosis Date Noted   Cognitive change 03/17/2023   Hyponatremia 03/13/2023   Pulmonary embolus (HCC) 03/05/2023   Acute deep vein thrombosis (DVT) of popliteal vein of left lower extremity (HCC) 03/05/2023   Chronic heart failure with mildly reduced ejection fraction (HFmrEF, 41-49%) (HCC) 03/05/2023   AKI (acute kidney injury) 03/05/2023   Precordial pain 03/03/2023   Shortness of breath 03/03/2023   Benign hypertension 03/03/2023   Heart failure with improved ejection fraction (HFimpEF) (HCC) 03/03/2023   Coping style affecting medical condition 02/27/2023   Acute right PCA stroke (HCC) 02/21/2023   Acute ischemic right PCA stroke (HCC) 02/14/2023   Posterior cerebral artery embolism, right 02/14/2023   Special screening for malignant neoplasms, colon 04/09/2017   Dizziness 03/29/2013   Cerebellar stroke, acute (HCC) 03/29/2013   CHF (congestive heart failure) (HCC) 03/28/2013   Vertigo 03/28/2013   PCP: Dr. Norleen Hurst  REFERRING PROVIDER: Dr. Murray Collier  ONSET DATE: 02/2023  REFERRING DIAG: P36.468 (ICD-10-CM) - Acute right PCA stroke (HCC)   THERAPY DIAG:  Other lack of coordination - Plan: Ot plan of care cert/re-cert  Other symptoms and signs involving the nervous system - Plan: Ot plan of care cert/re-cert  Rationale for Evaluation and Treatment: Rehabilitation  SUBJECTIVE:  SUBJECTIVE STATEMENT: S: I'm pretty good today  PERTINENT HISTORY: 02/14/2023; woke up could not move; stroke; [facility] and then CIR; then had home health and now referred to outpatient; he has an AFO but not wearing; states it does not fit. Had a WC but sent that back as he was not using it.  Steps most challenging thing for him; no handrails for basement steps. States he is supposed to be having a surgery on his neck.  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  Has washer and dryer in basement; wood stove.   PRECAUTIONS: Fall  WEIGHT BEARING RESTRICTIONS: No  PAIN:  Are you having pain? No  FALLS: Has patient fallen in last 6 months? No  PLOF: Independent  PATIENT GOALS: To be able to use the left hand to help with things.   OBJECTIVE:  Note: Objective measures were completed at Evaluation unless otherwise noted.  HAND DOMINANCE: Right  ADLs: Overall ADLs: Pt reports difficulty with holding items in the left hand, drops a lot of things. Pt reports he needs assistance with tying shoes, operating buttons. Pt has difficulty with cutting food when trying to hold the fork in his left hand.   FUNCTIONAL OUTCOME MEASURES: Quick Dash:  QUICK DASH  Please rate your ability do the following activities in the last week by selecting the number below the appropriate response.   Activities Rating  Open a tight or new jar.  5 = Unable  Do heavy household chores (e.g., wash walls, floors). 2 = Mild difficulty  Carry a shopping bag or briefcase 1 = No difficulty   Wash your back. 2 = Mild difficulty  Use a knife to cut food. 2 = Mild difficulty  Recreational activities in which you take some force or impact through your arm, shoulder or hand (e.g., golf, hammering, tennis, etc.). 5 = Unable  During the past week, to what extent has your arm, shoulder or hand problem interfered with your normal social activities with family, friends, neighbors or groups?  2 = Slightly  During the past week, were you limited in your work or other regular daily activities as a result of your arm, shoulder or hand problem? 3 = Moderately limited  Rate the severity of the following symptoms in the last week: Arm, Shoulder, or hand pain. 1 = none  Rate the severity of the following symptoms in the last week: Tingling (pins and needles) in your arm, shoulder or hand. 2  = Mild  During the past week, how much difficulty have you had sleeping because of the pain in your arm, shoulder or hand?  1 = No difficulty   (A QuickDASH score may not be calculated if there is greater than 1 missing item.)  Quick Dash Disability/Symptom Score: [(sum of 26 (n) responses/11 (n)] -1 x 25 = 34.1  Minimally Clinically Important Difference (MCID): 15-20 points  (Franchignoni, F. et al. (2013). Minimally clinically important difference of the disabilities of the arm, shoulder, and hand outcome measures (DASH) and its shortened version (Quick DASH). Journal of Orthopaedic & Sports Physical Therapy, 44(1), 30-39)    UPPER EXTREMITY ROM:    Active ROM Left eval Left 01/27/24  Shoulder flexion 92 102  Shoulder abduction 95 109  Shoulder internal rotation 90 90  Shoulder external rotation 34  30  Wrist flexion WFL   Wrist extension WFL   Wrist ulnar deviation WFL   Wrist radial deviation WFL   Wrist pronation WFL   Wrist supination WFL   (Blank rows = not tested)  UPPER EXTREMITY MMT:     MMT Left eval Left 01/27/24  Shoulder flexion 4/5 4/5  Shoulder abduction 4/5 4+/5  Shoulder internal rotation 5/5 5/5  Shoulder external rotation 4-/5 4/5  Elbow flexion 5/5 5/5  Elbow extension 4+/5 4+/5  Wrist flexion 5/5   Wrist extension 5/5   Wrist ulnar deviation 5/5   Wrist radial deviation 5/5   Wrist pronation 5/5   Wrist supination 5/5   (Blank rows = not tested)  HAND FUNCTION: Grip strength: Right: 65 lbs; Left: 50 lbs, Lateral pinch: Right: 19 lbs, Left: 15 lbs, and 3 point pinch: Right: 12 lbs, Left: 12 lbs 01/27/24: Left: 62; Left lateral pinch: 16; left 3 point pinch: 12  COORDINATION: 9 Hole Peg test: Right: 33.9 sec; Left: 1'53 sec 01/27/24: Left: 1'29  SENSATION: [place] Speed: 2.83-3.22=normal to decreased light touch sensation  EDEMA: None  COGNITION: Overall cognitive status: Within functional limits for tasks  assessed  VISION: Subjective report: residual double vision Baseline vision: Wears glasses all the time Visual history: glaucoma, cataracts, and had cataract surgery ~2006                                                                                                                             TREATMENT DATE:  01/27/24 -Shoulder strengthening: supine, 1# wrist weights-protraction, flexion, abduction, horizontal abduction, er, 10 reps -Proximal shoulder strengthening: supine, 1# wrist weights-paddles, criss cross, circles each direction, 10 reps -ABC writing: shoulder at 90 degrees flexion -Boom wackers:  -Pt alternating between colored boom wackers, OT calling color and pt hitting with his boomwacker  -OT holding boom wackers still, pt working on speed and fluidity going back and forth between the two colors.   Completed in 4 positions, side to side at chest height, up and down at chest height, and PNF patterns at chest   height. Good speed with side to side and up/down, mod difficulty with PNF patterns -Therapy ball strengthening: blue kickball-chest press, flexion, circles each direction, 10 reps  01/25/24 -Strengthening: 2# dumbbells, protraction, shoulder flexion, abduction, horizontal abduction, er/IR, hammer curls, bicep curls, x10 -Wrist Strengthening: 2# dumbbells, flexion, extension, ulnar/radial deviation, supination/pronation, x10 -Grooved peg board -Therabar: teal, extension/flexion twists, pronated and supinated bends, x10  01/22/24 -Shoulder strengthening: sitting, 1# wrist weights-protraction, flexion, abduction, horizontal abduction, er, 10 reps -ABC writing: shoulder at 90 degrees flexion -Sponges: 13, 18, 20 -Pinch tree: pt reaching into flexion to place clothespins along vertical bar of pinch tree. Pt using lateral pinch to place clothespins, placing 12 pins total. Using 3 point pinch to remove. Increased time for task. -Grip strengthening: large and medium beads at  42# with gripper horizontal       PATIENT EDUCATION: Education details:  reviewed HEP Person educated: Patient Education method: Explanation, Demonstration, and Handouts Education comprehension: verbalized understanding and returned demonstration  HOME EXERCISE PROGRAM: Eval: finger A/ROM   GOALS: Goals reviewed with patient? Yes  SHORT TERM GOALS: Target date: 03/12/24  Pt will be provided with and educated on HEP to improve ability to use left hand during ADLs and housework tasks.   Goal status: IN PROGRESS  2.  Pt will be educated on strategies to improve ability to hug people and remove arm without brushing against people inappropriately.   Goal status: IN PROGRESS  3.  Pt will improve fine motor coordination in LUE required for operating buttons and manipulating small objects by completing 9 hole peg test in under 1'.   Goal status: IN PROGRESS  4.  Pt will increase left hand grip strength by 8# to improve ability to grasp and move items such as a backpack or grocery bag.   Goal status: MET  5.  Pt will increase LUE A/ROM by 20 degrees or greater to improve ability to perform functional reaching required for bathing, dressing, and reaching behind back.   Goal status: IN PROGRESS  6.  Pt will increase LUE strength for shoulder flexion and abduction to 4+/5 or greater to improve ability to perform functional reaching tasks above shoulder height.   Goal status: IN PROGRESS  7.  Pt will improve bilateral coordination and motor planning required for operating utensils by reporting independence with cutting daily foods.   Goal status: IN PROGRESS  ASSESSMENT:  CLINICAL IMPRESSION: Reassessment completed this session, pt reports he is trying to use his LUE at home, however has not been consistent in his HEP completion. Pt is demonstrating good progress with shoulder ROM, strength, as well as grip strength and fine motor coordination. Pt has improved his grip strength  by 12# and is near his baseline compared to RUE, has reduced his 9 hole peg test time by 25+. Continued with strength and motor planning work today, noting improvement in tremors and control. Pt will benefit from continued skilled OT services to improve LUE motor planning, strength, and coordination required for successful completion of ADLs.     PERFORMANCE DEFICITS: in functional skills including ADLs, IADLs, coordination, dexterity, tone, ROM, strength, and UE functional use   PLAN:  OT FREQUENCY: 2x/week  OT DURATION: 6 weeks  PLANNED INTERVENTIONS: 97168 OT Re-evaluation, 97535 self care/ADL training, 02889 therapeutic exercise, 97530 therapeutic activity, 97112 neuromuscular re-education, 97140 manual therapy, patient/family education, and DME and/or AE instructions  CONSULTED AND AGREED WITH PLAN OF CARE: Patient  PLAN FOR NEXT SESSION: Follow up on HEP, motor planning, coordination, and strengthening work   Sonny Cory, OTR/L  [phone] 01/27/2024, 12:19 PM   [employer] Medicare Auth Request Information Treatment Start Date: 02/02/24  Date of referral: 12/21/23 Referring provider: Dr. Murray Collier Referring diagnosis (ICD 10)? P36.468 (ICD-10-CM) - Acute right PCA stroke (HCC)  Treatment diagnosis (ICD 10)? (if different than referring diagnosis) R29.818, R27.8   What was this (referring dx) caused by? Other: CVA  Lysle of Condition: Chronic (continuous duration > 3 months)   Laterality: Lt  Current Functional Measure Score: DASH 34.1  Objective measurements identify impairments when they are compared to normal values, the uninvolved extremity, and prior level of function.  [x]  Yes  []  No  Objective assessment of functional ability: Moderate functional limitations   Briefly describe symptoms: decreased strength, coordination, and functional use of the LUE. Difficulty with motor planning. Decreased independence in ADLs  How did symptoms start: CVA  Average  pain intensity:  Last 24 hours: 1/10  Past week: 0/10  How often does the pt experience symptoms? Constantly  How much have the symptoms interfered with usual daily activities? Moderately  How has condition changed since care began at this facility? A little better  In general, how is the patients overall health? Good   BACK PAIN (STarT Back Screening Tool) No        "

## 2024-01-29 ENCOUNTER — Telehealth: Payer: Self-pay | Admitting: Physical Medicine & Rehabilitation

## 2024-01-29 NOTE — Telephone Encounter (Signed)
 Pt left a voice mail asking about his blood thinner. How many take or should he continue taking them

## 2024-02-01 ENCOUNTER — Ambulatory Visit (HOSPITAL_COMMUNITY): Admitting: Occupational Therapy

## 2024-02-01 ENCOUNTER — Encounter (HOSPITAL_COMMUNITY): Payer: Self-pay

## 2024-02-01 ENCOUNTER — Encounter (HOSPITAL_COMMUNITY): Payer: Self-pay | Admitting: Occupational Therapy

## 2024-02-01 ENCOUNTER — Ambulatory Visit (HOSPITAL_COMMUNITY)

## 2024-02-01 DIAGNOSIS — R2689 Other abnormalities of gait and mobility: Secondary | ICD-10-CM

## 2024-02-01 DIAGNOSIS — R278 Other lack of coordination: Secondary | ICD-10-CM

## 2024-02-01 DIAGNOSIS — R29818 Other symptoms and signs involving the nervous system: Secondary | ICD-10-CM

## 2024-02-01 DIAGNOSIS — R531 Weakness: Secondary | ICD-10-CM

## 2024-02-01 DIAGNOSIS — R262 Difficulty in walking, not elsewhere classified: Secondary | ICD-10-CM

## 2024-02-01 NOTE — Therapy (Signed)
 " OUTPATIENT PHYSICAL THERAPY LOWER EXTREMITY TREATMENT      Patient Name: Edward Rodgers MRN: [id number] DOB:[birth year], 74 y.o., male Today's Date: 02/01/2024  END OF SESSION:  PT End of Session - 02/01/24 1456     Visit Number 15    Number of Visits 17    Date for Recertification  02/09/24    Authorization Type [employer] Medicare    Authorization Time Period [employer] Med approved 12 visits from 01/15/2024-02/26/2024    Authorization - Visit Number 6    Authorization - Number of Visits 12    Progress Note Due on Visit 19    PT Start Time 1457    PT Stop Time 1539    PT Time Calculation (min) 42 min    Equipment Utilized During Treatment --    Activity Tolerance Patient tolerated treatment well    Behavior During Therapy WFL for tasks assessed/performed              Past Medical History:  Diagnosis Date   Cancer (HCC) 2002   prostate   CHF (congestive heart failure) (HCC)    Gout    Hypertension    Stroke (HCC)    Thyroid  disease    Past Surgical History:  Procedure Laterality Date   COLONOSCOPY N/A 07/15/2017   Procedure: COLONOSCOPY;  Surgeon: Golda Claudis PENNER, MD;  Location: [facility];  Service: Endoscopy;  Laterality: N/A;  830   IR CT HEAD LTD  02/14/2023   IR PERCUTANEOUS ART THROMBECTOMY/INFUSION INTRACRANIAL INC DIAG ANGIO  02/14/2023   LOOP RECORDER INSERTION N/A 02/20/2023   Procedure: LOOP RECORDER INSERTION;  Surgeon: Fernande Elspeth BROCKS, MD;  Location: [facility] INVASIVE CV LAB;  Service: Cardiovascular;  Laterality: N/A;   POLYPECTOMY  07/15/2017   Procedure: POLYPECTOMY;  Surgeon: Golda Claudis PENNER, MD;  Location: [facility];  Service: Endoscopy;;  colon   PROSTATE SURGERY     RADIOLOGY WITH ANESTHESIA Left 02/14/2023   Procedure: IR WITH ANESTHESIA;  Surgeon: Radiologist, Medication, MD;  Location: [facility] OR;  Service: Radiology;  Laterality: Left;   RADIOLOGY WITH ANESTHESIA N/A 02/14/2023   Procedure: IR WITH ANESTHESIA;  Surgeon: Radiologist, Medication, MD;  Location: [facility]  OR;  Service: Radiology;  Laterality: N/A;   Patient Active Problem List   Diagnosis Date Noted   Cognitive change 03/17/2023   Hyponatremia 03/13/2023   Pulmonary embolus (HCC) 03/05/2023   Acute deep vein thrombosis (DVT) of popliteal vein of left lower extremity (HCC) 03/05/2023   Chronic heart failure with mildly reduced ejection fraction (HFmrEF, 41-49%) (HCC) 03/05/2023   AKI (acute kidney injury) 03/05/2023   Precordial pain 03/03/2023   Shortness of breath 03/03/2023   Benign hypertension 03/03/2023   Heart failure with improved ejection fraction (HFimpEF) (HCC) 03/03/2023   Coping style affecting medical condition 02/27/2023   Acute right PCA stroke (HCC) 02/21/2023   Acute ischemic right PCA stroke (HCC) 02/14/2023   Posterior cerebral artery embolism, right 02/14/2023   Special screening for malignant neoplasms, colon 04/09/2017   Dizziness 03/29/2013   Cerebellar stroke, acute (HCC) 03/29/2013   CHF (congestive heart failure) (HCC) 03/28/2013   Vertigo 03/28/2013    PCP: Shona Norleen PEDLAR, MD  REFERRING PROVIDER: [name], MD  REFERRING DIAG: [phone] (ICD-10-CM) - Acute right PCA stroke (HCC)  THERAPY DIAG:  Left-sided weakness  Difficulty in walking, not elsewhere classified  Other abnormalities of gait and mobility  Rationale for Evaluation and Treatment: Rehabilitation  ONSET DATE: 02/14/2023  SUBJECTIVE:  SUBJECTIVE STATEMENT: Patient reports that he fells pretty good today. He didn't have any problems after his last appointment. He has been trying to work on not dragging his left foot.    Eval:02/14/2023; woke up could not move; stroke; [name] and then CIR; then had home health and now referred to outpatient; he has an AFO but not wearing; states it does not fit; asked him to bring in next visit.  Had a WC but sent that back as he was not using it.  Steps most challenging thing for him; no handrails for basement steps. States he is supposed to be  having a surgery on his neck?  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   Washer and dryer in basement; wood stove  PERTINENT HISTORY: CHF, HTN, gout, stroke PAIN:  Are you having pain? Yes: NPRS scale: 0/10 Pain location: left upper trap and shoulder Pain description: sharp Aggravating factors: turning head to left Relieving factors: lidocaine  patch  PRECAUTIONS: Fall  RED FLAGS: None   WEIGHT BEARING RESTRICTIONS: No  FALLS:  Has patient fallen in last 6 months? Yes. Number of falls 2; at hospital slid out of WC  LIVING ENVIRONMENT: Lives with: lives with their spouse Lives in: House/apartment Stairs: Yes: Internal: 10 steps; none Has following equipment at home: Vannie - 2 wheeled, Grab bars, and Ramped entry  OCCUPATION: retired neurosurgeon  PLOF: Independent with household mobility with device and wife assists with buttons and sometimes with bathing  PATIENT GOALS: to walk like I used to; do steps better  NEXT MD VISIT: next month  OBJECTIVE:  Note: Objective measures were completed at Evaluation unless otherwise noted.  DIAGNOSTIC FINDINGS: I63.531 (ICD-10-CM) - Acute right PCA stroke (HCC)  PATIENT SURVEYS:  LEFS  Extreme difficulty/unable (0), Quite a bit of difficulty (1), Moderate difficulty (2), Little difficulty (3), No difficulty (4) Survey date:    Any of your usual work, housework or school activities   2. Usual hobbies, recreational or sporting activities   3. Getting into/out of the bath   4. Walking between rooms   5. Putting on socks/shoes   6. Squatting    7. Lifting an object, like a bag of groceries from the floor   8. Performing light activities around your home   9. Performing heavy activities around your home   10. Getting into/out of a car   11. Walking 2 blocks   12.  Walking 1 mile   13. Going up/down 10 stairs (1 flight)   14. Standing for 1 hour   15.  sitting for 1 hour   16. Running on even ground   17. Running on uneven ground   18. Making sharp turns while running fast   19. Hopping    20. Rolling over in bed   Score total:  21/80; 26.3%     COGNITION: Overall cognitive status: Within functional limits for tasks assessed     SENSATION:   EDEMA:  None noted  MUSCLE LENGTH: Hamstrings:   POSTURE: flexed trunk   PALPATION:   LOWER EXTREMITY MMT:  MMT Right eval Left eval Left 01/12/24  Hip flexion 4+ 4 4+  Hip extension     Hip abduction     Hip adduction     Hip internal rotation  Hip external rotation     Knee flexion     Knee extension 5 4+ 5  Ankle dorsiflexion 5 4 4   Ankle plantarflexion     Ankle inversion     Ankle eversion      (Blank rows = not tested)   FUNCTIONAL TESTS:  5 times sit to stand: 1.02.43 sec using hands to push up to standing 2 minute walk test: 125 ft with rollator SLS right 7 and left 4  GAIT: Distance walked: 125 ft Assistive device utilized: [employer]ant - 4 wheeled Level of assistance: Modified independence Comments: slight toe drop left; decreased gait speed                                                                                                                                TREATMENT DATE:                                    02/01/24 EXERCISE LOG  Exercise Repetitions and Resistance Comments  Nustep  L4 x 5 minutes    Toe taps on step  8 step x 20 reps each  With cone to prevent left hip circumduction   Standing toe raise   20 reps    Static stance on foam   2 minutes  With therapist perturbations   Reaching outside BOS on foam  2 minutes  With boom sticks  Gait training  289 feet with rollator  With cueing for exaggerated step height   Stepping over hurdles  3 laps in parallel bars  2 low hurdles and 1 high hurdle; BUE support from parallel bars   Side stepping  over hurdles  2 laps in parallel bars  2 low hurdles and 1 high hurdle; BUE support from parallel bars   Sit to stand  10 reps  Hold round tidal tank    Blank cell = exercise not performed today   01/27/24 Sit to stand from low mat table x 5 no UE assist; 2 x 5 with holding yellow med ball Elevated mat table up to 22.5 inches; sit to stand with holding sphere tidal tank 2 x 5 Standing: Boom stick in right hand hitting target (another boom stick PT is holding and moving); feet even x 15 reps then stagger stance each way x 15 reps each 1 lap PT gym with rollator Nustep seat 11 x 5' to end treatment   01/25/24 Nustep seat 11 level 3 dynamic warm up x 5' 6 step ups 2 x 10 leading with left leg leading; bilateral UE support Sit to stand with 1 UE assist 2 x 5 6 lateral step ups 2 x 10 with bilateral UE assist 4# hip abduction 2 x 10 4# hip extension 2 x 10 4# marching 2 x 10  PATIENT EDUCATION:  Education details: Patient educated on exam findings, POC, scope of  PT, HEP, and what to expect next visit. Person educated: Patient Education method: Explanation, Demonstration, and Handouts Education comprehension: verbalized understanding, returned demonstration, verbal cues required, and tactile cues required  HOME EXERCISE PROGRAM: Access Code: [email or web address] URL: [email or web address] Date: 12/03/2023 Prepared by: [facility]  Exercises - Sit to Stand with Armchair  - 2 x daily - 7 x weekly - 1 sets - 5 reps - Standing Ankle Plantar Flexion Dorsiflexion with Counter Support  - 2 x daily - 7 x weekly - 2 sets - 10 reps  01/15/24: - Bird Dog  - 2 x daily - 7 x weekly - 2 sets - 10 reps - 5 hold  ASSESSMENT:  CLINICAL IMPRESSION: Patient was progressed with new and familiar interventions to facilitate improved gait mechanics and functional mobility. He required minimal cueing with gait training to facilitate improved foot clearance. He experienced a moderate increase in lower  extremity fatigue with today's interventions as evidenced by his increased difficulty with activities such as side stepping over hurdles and sit to stands. He reported that his legs felt tired upon the conclusion of treatment. Patient continues to require skilled physical therapy to address their remaining impairments to return to her prior level of function.     Eval:Patient is a 74 y.o. male who was seen today for physical therapy evaluation and treatment for I63.531 (ICD-10-CM) - Acute right PCA stroke (HCC). Patient demonstrates decreased strength, balance deficits and gait abnormalities which are negatively impacting patient ability to perform ADLs and functional mobility tasks. Patient will benefit from skilled physical therapy services to address these deficits to improve level of function with ADLs, functional mobility tasks, and reduce risk for falls.    OBJECTIVE IMPAIRMENTS: Abnormal gait, increased fascial restrictions, impaired perceived functional ability, and pain.   ACTIVITY LIMITATIONS: carrying, lifting, bending, standing, squatting, stairs, and locomotion level  PARTICIPATION LIMITATIONS: meal prep, cleaning, laundry, driving, shopping, and occupation  REHAB POTENTIAL: Good  CLINICAL DECISION MAKING: Evolving/moderate complexity  EVALUATION COMPLEXITY: Moderate   GOALS: Goals reviewed with patient? No  SHORT TERM GOALS: Target date: 12/24/2023 patient will be independent with initial HEP and compliant with HEP 3-4 times a week   Baseline: Goal status: in progress  2.  Patient will report 30% improvement overall  Baseline:  Goal status: met   LONG TERM GOALS: Target date: 02/09/2024  Patient will be independent in self management strategies to improve quality of life and functional outcomes.  Baseline:  Goal status: in progress  2.  Patient will report 50% improvement overall  Baseline:  Goal status: met  3.  Patient will improve LEFS score by 9 points  to demonstrate improved perceived function  Baseline: 21/80; 43/80 01/12/24 Goal status: met  4.  Patient will increase distance on 2 MWT to 225 ft or more with LRAD to demonstrate improved speed and efficiency with household and community ambulation  Baseline: 125 fit with rollator; 184 ft with rollator 01/12/24 Goal status: in progress  5.  Patient will improve 5 times sit to stand score to 30 sec or less to demonstrate improved functional mobility and increased leg strength.  Baseline: 1 min and 2.45 sec; 30.88 sec 01/12/24 Goal status: in progress    PLAN:  PT FREQUENCY: 1-2x/week  PT DURATION: 4 weeks  PLANNED INTERVENTIONS: [id number]- PT Re-evaluation, 97110-Therapeutic exercises, 97530- Therapeutic activity, 97112- Neuromuscular re-education, 97535- Self Care, [id number]- Manual therapy, [id number]- Gait training, [phone]- Orthotic Fit/training, [phone]- Canalith repositioning, [id number]- Aquatic Therapy, [id number]- Splinting, [id number]- Wound  care (first 20 sq cm), 97598- Wound care (each additional 20 sq cm)Patient/Family education, Balance training, Stair training, Taping, Dry Needling, Joint mobilization, Joint manipulation, Spinal manipulation, Spinal mobilization, Scar mobilization, and DME instructions.   PLAN FOR NEXT SESSION:  Progress LE functional strength and stabilization; balance and gait;  Begin floor to standing, stair training and continue gait training on treadmill  Lacinda Fass, PT, DPT  6:13 PM, 02/01/24  "

## 2024-02-01 NOTE — Therapy (Signed)
 " OUTPATIENT OCCUPATIONAL THERAPY NEURO TREATMENT NOTE    Patient Name: Edward Rodgers MRN: [id number] DOB:Jul 05, 1950, 74 y.o., male Today's Date: 02/01/2024    END OF SESSION:  OT End of Session - 02/01/24 1305     Visit Number 8    Number of Visits 15    Date for Recertification  03/11/24    Authorization Type [employer] Medicare    Authorization Time Period 8 visits approved 12/29/23-02/23/24; requesting additional visits    Authorization - Visit Number 8    Authorization - Number of Visits 8    Progress Note Due on Visit 10    OT Start Time 1255    OT Stop Time 1335    OT Time Calculation (min) 40 min    Activity Tolerance Patient tolerated treatment well    Behavior During Therapy [facility] for tasks assessed/performed            Past Medical History:  Diagnosis Date   Cancer (HCC) 2002   prostate   CHF (congestive heart failure) (HCC)    Gout    Hypertension    Stroke (HCC)    Thyroid  disease    Past Surgical History:  Procedure Laterality Date   COLONOSCOPY N/A 07/15/2017   Procedure: COLONOSCOPY;  Surgeon: Golda Claudis PENNER, MD;  Location: [facility];  Service: Endoscopy;  Laterality: N/A;  830   IR CT HEAD LTD  02/14/2023   IR PERCUTANEOUS ART THROMBECTOMY/INFUSION INTRACRANIAL INC DIAG ANGIO  02/14/2023   LOOP RECORDER INSERTION N/A 02/20/2023   Procedure: LOOP RECORDER INSERTION;  Surgeon: Fernande Elspeth BROCKS, MD;  Location: [facility] INVASIVE CV LAB;  Service: Cardiovascular;  Laterality: N/A;   POLYPECTOMY  07/15/2017   Procedure: POLYPECTOMY;  Surgeon: Golda Claudis PENNER, MD;  Location: [facility];  Service: Endoscopy;;  colon   PROSTATE SURGERY     RADIOLOGY WITH ANESTHESIA Left 02/14/2023   Procedure: IR WITH ANESTHESIA;  Surgeon: Radiologist, Medication, MD;  Location: [facility] OR;  Service: Radiology;  Laterality: Left;   RADIOLOGY WITH ANESTHESIA N/A 02/14/2023   Procedure: IR WITH ANESTHESIA;  Surgeon: Radiologist, Medication, MD;  Location: [facility] OR;  Service: Radiology;  Laterality:  N/A;   Patient Active Problem List   Diagnosis Date Noted   Cognitive change 03/17/2023   Hyponatremia 03/13/2023   Pulmonary embolus (HCC) 03/05/2023   Acute deep vein thrombosis (DVT) of popliteal vein of left lower extremity (HCC) 03/05/2023   Chronic heart failure with mildly reduced ejection fraction (HFmrEF, 41-49%) (HCC) 03/05/2023   AKI (acute kidney injury) 03/05/2023   Precordial pain 03/03/2023   Shortness of breath 03/03/2023   Benign hypertension 03/03/2023   Heart failure with improved ejection fraction (HFimpEF) (HCC) 03/03/2023   Coping style affecting medical condition 02/27/2023   Acute right PCA stroke (HCC) 02/21/2023   Acute ischemic right PCA stroke (HCC) 02/14/2023   Posterior cerebral artery embolism, right 02/14/2023   Special screening for malignant neoplasms, colon 04/09/2017   Dizziness 03/29/2013   Cerebellar stroke, acute (HCC) 03/29/2013   CHF (congestive heart failure) (HCC) 03/28/2013   Vertigo 03/28/2013   PCP: Dr. Norleen Hurst  REFERRING PROVIDER: Dr. Murray Collier  ONSET DATE: 02/2023  REFERRING DIAG: P36.468 (ICD-10-CM) - Acute right PCA stroke (HCC)   THERAPY DIAG:  Other lack of coordination  Other symptoms and signs involving the nervous system  Rationale for Evaluation and Treatment: Rehabilitation  SUBJECTIVE:   SUBJECTIVE STATEMENT: S: I got some weights I've been wearing on my ankles  PERTINENT HISTORY: 02/14/2023; woke up could not move; stroke; [facility] and then CIR; then had home health and now referred to outpatient; he has an AFO but not wearing; states it does not fit. Had a WC but sent that back as he was not using it.  Steps most challenging thing for him; no handrails for basement steps. States he is supposed to be having a surgery on his neck.  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  Has washer and dryer in basement; wood stove.   PRECAUTIONS: Fall  WEIGHT BEARING RESTRICTIONS: No  PAIN:  Are you having pain? No  FALLS: Has patient fallen in last 6 months? No  PLOF: Independent  PATIENT GOALS: To be able to use the left hand to help with things.   OBJECTIVE:  Note: Objective measures were completed at Evaluation unless otherwise noted.  HAND DOMINANCE: Right  ADLs: Overall ADLs: Pt reports difficulty with holding items in the left hand, drops a lot of things. Pt reports he needs assistance with tying shoes, operating buttons. Pt has difficulty with cutting food when trying to hold the fork in his left hand.   FUNCTIONAL OUTCOME MEASURES: Quick Dash:  QUICK DASH  Please rate your ability do the following activities in the last week by selecting the number below the appropriate response.   Activities Rating  Open a tight or new jar.  5 = Unable  Do heavy household chores (e.g., wash walls, floors). 2 = Mild difficulty  Carry a shopping bag or briefcase 1 = No difficulty   Wash your back. 2 = Mild difficulty  Use a knife to cut food. 2 = Mild difficulty  Recreational activities in which you take some force or impact through your arm, shoulder or hand (e.g., golf, hammering, tennis, etc.). 5 = Unable  During the past week, to what extent has your arm, shoulder or hand problem interfered with your normal social activities with family, friends, neighbors or groups?  2 = Slightly  During the past week, were you limited in your work or other regular daily activities as a result of your arm, shoulder or hand problem? 3 = Moderately limited  Rate the severity of the following symptoms in the last week: Arm, Shoulder, or hand pain. 1 = none  Rate the severity of the following symptoms in the last week: Tingling (pins and needles) in your arm, shoulder or hand. 2 = Mild  During the past week, how much difficulty have you had sleeping  because of the pain in your arm, shoulder or hand?  1 = No difficulty   (A QuickDASH score may not be calculated if there is greater than 1 missing item.)  Quick Dash Disability/Symptom Score: [(sum of 26 (n) responses/11 (n)] -1 x 25 = 34.1  Minimally Clinically Important Difference (MCID): 15-20 points  (Franchignoni, F. et al. (2013). Minimally clinically important difference of the disabilities of the arm, shoulder, and hand outcome measures (DASH) and its shortened version (Quick DASH). Journal of Orthopaedic & Sports Physical Therapy, 44(1), 30-39)    UPPER EXTREMITY ROM:    Active ROM Left eval Left 01/27/24  Shoulder flexion 92 102  Shoulder abduction 95 109  Shoulder internal rotation 90 90  Shoulder external rotation 34 30  Wrist flexion WFL   Wrist  extension WFL   Wrist ulnar deviation WFL   Wrist radial deviation WFL   Wrist pronation WFL   Wrist supination WFL   (Blank rows = not tested)  UPPER EXTREMITY MMT:     MMT Left eval Left 01/27/24  Shoulder flexion 4/5 4/5  Shoulder abduction 4/5 4+/5  Shoulder internal rotation 5/5 5/5  Shoulder external rotation 4-/5 4/5  Elbow flexion 5/5 5/5  Elbow extension 4+/5 4+/5  Wrist flexion 5/5   Wrist extension 5/5   Wrist ulnar deviation 5/5   Wrist radial deviation 5/5   Wrist pronation 5/5   Wrist supination 5/5   (Blank rows = not tested)  HAND FUNCTION: Grip strength: Right: 65 lbs; Left: 50 lbs, Lateral pinch: Right: 19 lbs, Left: 15 lbs, and 3 point pinch: Right: 12 lbs, Left: 12 lbs 01/27/24: Left: 62; Left lateral pinch: 16; left 3 point pinch: 12  COORDINATION: 9 Hole Peg test: Right: 33.9 sec; Left: 1'53 sec 01/27/24: Left: 1'29  SENSATION: [place] Speed: 2.83-3.22=normal to decreased light touch sensation  EDEMA: None  COGNITION: Overall cognitive status: Within functional limits for tasks assessed  VISION: Subjective report: residual double vision Baseline vision: Wears glasses all the  time Visual history: glaucoma, cataracts, and had cataract surgery ~2006                                                                                                                             TREATMENT DATE:  02/01/24 -Pinch tree: pt sitting at table, using lateral pinch to grasp clothespins and line up the vertical bar. Pt using yellow, red, and green clothespins, placing 21 pins on the bar.  -Therapy ball strengthening: 1# wrist weight, blue kickball-chest press, flexion, circles each direction, PNF patterns, 10 reps -Sponges: 17, 12 -Small pegboard: pt holding several pegs in hand at one time, working on translating to fingertips to place into pegboard. Max difficulty with translation, requiring increased time to success. Dropping several pegs, placing 10. Pt removing one at a time, holding in palm, able to hold all 10 without dropping.  -Towel crumple: 5 reps -Digit opposition: 5 reps  01/27/24 -Shoulder strengthening: supine, 1# wrist weights-protraction, flexion, abduction, horizontal abduction, er, 10 reps -Proximal shoulder strengthening: supine, 1# wrist weights-paddles, criss cross, circles each direction, 10 reps -ABC writing: shoulder at 90 degrees flexion -Boom wackers:  -Pt alternating between colored boom wackers, OT calling color and pt hitting with his boomwacker  -OT holding boom wackers still, pt working on speed and fluidity going back and forth between the two colors.   Completed in 4 positions, side to side at chest height, up and down at chest height, and PNF patterns at chest   height. Good speed with side to side and up/down, mod difficulty with PNF patterns -Therapy ball strengthening: blue kickball-chest press, flexion, circles each direction, 10 reps  01/25/24 -Strengthening: 2# dumbbells, protraction, shoulder flexion, abduction, horizontal abduction, er/IR, hammer curls, bicep curls, x10 -Wrist  Strengthening: 2# dumbbells, flexion, extension, ulnar/radial  deviation, supination/pronation, x10 -Grooved peg board -Therabar: teal, extension/flexion twists, pronated and supinated bends, x10       PATIENT EDUCATION: Education details: reviewed HEP Person educated: Patient Education method: Programmer, Multimedia, Demonstration, and Handouts Education comprehension: verbalized understanding and returned demonstration  HOME EXERCISE PROGRAM: Eval: finger A/ROM 1/19-shoulder A/ROM   GOALS: Goals reviewed with patient? Yes  SHORT TERM GOALS: Target date: 03/12/24  Pt will be provided with and educated on HEP to improve ability to use left hand during ADLs and housework tasks.   Goal status: IN PROGRESS  2.  Pt will be educated on strategies to improve ability to hug people and remove arm without brushing against people inappropriately.   Goal status: IN PROGRESS  3.  Pt will improve fine motor coordination in LUE required for operating buttons and manipulating small objects by completing 9 hole peg test in under 1'.   Goal status: IN PROGRESS  4.  Pt will increase left hand grip strength by 8# to improve ability to grasp and move items such as a backpack or grocery bag.   Goal status: MET  5.  Pt will increase LUE A/ROM by 20 degrees or greater to improve ability to perform functional reaching required for bathing, dressing, and reaching behind back.   Goal status: IN PROGRESS  6.  Pt will increase LUE strength for shoulder flexion and abduction to 4+/5 or greater to improve ability to perform functional reaching tasks above shoulder height.   Goal status: IN PROGRESS  7.  Pt will improve bilateral coordination and motor planning required for operating utensils by reporting independence with cutting daily foods.   Goal status: IN PROGRESS  ASSESSMENT:  CLINICAL IMPRESSION: Pt reports he has been doing his HEP sometimes. Continued with LUE strengthening, using wrist weights for therapy ball work. Pt completing fine motor work today,  mod to max difficulty with peg translation, however was able to complete task successfully with increased time. Updated HEP for shoulder A/ROM to progress shoulder strengthening. Verbal cuing for form and technique, increased time for tasks, no pain at end of session.     PERFORMANCE DEFICITS: in functional skills including ADLs, IADLs, coordination, dexterity, tone, ROM, strength, and UE functional use   PLAN:  OT FREQUENCY: 2x/week  OT DURATION: 6 weeks  PLANNED INTERVENTIONS: 97168 OT Re-evaluation, 97535 self care/ADL training, 02889 therapeutic exercise, 97530 therapeutic activity, 97112 neuromuscular re-education, 97140 manual therapy, patient/family education, and DME and/or AE instructions  CONSULTED AND AGREED WITH PLAN OF CARE: Patient  PLAN FOR NEXT SESSION: Follow up on HEP, motor planning, coordination, and strengthening work   Sonny Cory, OTR/L  [phone] 02/01/2024, 1:35 PM   [employer] Medicare Auth Request Information Treatment Start Date: 02/02/24  Date of referral: 12/21/23 Referring provider: Dr. Murray Collier Referring diagnosis (ICD 10)? P36.468 (ICD-10-CM) - Acute right PCA stroke (HCC)  Treatment diagnosis (ICD 10)? (if different than referring diagnosis) R29.818, R27.8   What was this (referring dx) caused by? Other: CVA  Lysle of Condition: Chronic (continuous duration > 3 months)   Laterality: Lt  Current Functional Measure Score: DASH 34.1  Objective measurements identify impairments when they are compared to normal values, the uninvolved extremity, and prior level of function.  [x]  Yes  []  No  Objective assessment of functional ability: Moderate functional limitations   Briefly describe symptoms: decreased strength, coordination, and functional use of the LUE. Difficulty with motor planning. Decreased independence in ADLs   How  did symptoms start: CVA  Average pain intensity:  Last 24 hours: 1/10  Past week: 0/10  How often does the  pt experience symptoms? Constantly  How much have the symptoms interfered with usual daily activities? Moderately  How has condition changed since care began at this facility? A little better  In general, how is the patients overall health? Good   BACK PAIN (STarT Back Screening Tool) No        "

## 2024-02-01 NOTE — Patient Instructions (Signed)
 Repeat all exercises 10-15 times, 1-2 times per day.  1) Shoulder Protraction    Begin with elbows by your side, slowly punch straight out in front of you.      2) Shoulder Flexion  Sitting:         Begin with arms at your side with thumbs pointed up, slowly raise both arms up and forward towards overhead.               3) Horizontal abduction/adduction  Sitting:           Begin with arms straight out in front of you, bring out to the side in at T shape. Keep arms straight entire time.                 4) Internal & External Rotation   Sitting:     Stand with elbows at the side and elbows bent 90 degrees. Move your forearms away from your body, then bring back inward toward the body.     5) Shoulder Abduction  Sitting:       Lying on your back begin with your arms flat on the table next to your side. Slowly move your arms out to the side so that they go overhead, in a jumping jack or snow angel movement.

## 2024-02-03 ENCOUNTER — Ambulatory Visit (HOSPITAL_COMMUNITY)

## 2024-02-03 ENCOUNTER — Ambulatory Visit (HOSPITAL_COMMUNITY): Admitting: Occupational Therapy

## 2024-02-03 ENCOUNTER — Encounter (HOSPITAL_COMMUNITY): Payer: Self-pay | Admitting: Occupational Therapy

## 2024-02-03 DIAGNOSIS — R29818 Other symptoms and signs involving the nervous system: Secondary | ICD-10-CM

## 2024-02-03 DIAGNOSIS — R262 Difficulty in walking, not elsewhere classified: Secondary | ICD-10-CM

## 2024-02-03 DIAGNOSIS — I63531 Cerebral infarction due to unspecified occlusion or stenosis of right posterior cerebral artery: Secondary | ICD-10-CM

## 2024-02-03 DIAGNOSIS — R531 Weakness: Secondary | ICD-10-CM

## 2024-02-03 DIAGNOSIS — R2689 Other abnormalities of gait and mobility: Secondary | ICD-10-CM

## 2024-02-03 DIAGNOSIS — R278 Other lack of coordination: Secondary | ICD-10-CM

## 2024-02-03 NOTE — Therapy (Signed)
 " OUTPATIENT PHYSICAL THERAPY LOWER EXTREMITY TREATMENT      Patient Name: Edward Rodgers MRN: [id number] DOB:09-22-1950, 74 y.o., male Today's Date: 02/03/2024  END OF SESSION:  PT End of Session - 02/03/24 1035     Visit Number 16    Number of Visits 17    Date for Recertification  02/09/24    Authorization Type [employer] Medicare    Authorization Time Period [employer] Med approved 12 visits from 01/15/2024-02/26/2024    Authorization - Visit Number 7    Authorization - Number of Visits 12    Progress Note Due on Visit 19    PT Start Time 1033    PT Stop Time 1114    PT Time Calculation (min) 41 min    Activity Tolerance Patient tolerated treatment well    Behavior During Therapy [facility] for tasks assessed/performed              Past Medical History:  Diagnosis Date   Cancer (HCC) 2002   prostate   CHF (congestive heart failure) (HCC)    Gout    Hypertension    Stroke (HCC)    Thyroid  disease    Past Surgical History:  Procedure Laterality Date   COLONOSCOPY N/A 07/15/2017   Procedure: COLONOSCOPY;  Surgeon: Golda Claudis PENNER, MD;  Location: [facility];  Service: Endoscopy;  Laterality: N/A;  830   IR CT HEAD LTD  02/14/2023   IR PERCUTANEOUS ART THROMBECTOMY/INFUSION INTRACRANIAL INC DIAG ANGIO  02/14/2023   LOOP RECORDER INSERTION N/A 02/20/2023   Procedure: LOOP RECORDER INSERTION;  Surgeon: Fernande Elspeth BROCKS, MD;  Location: [facility] INVASIVE CV LAB;  Service: Cardiovascular;  Laterality: N/A;   POLYPECTOMY  07/15/2017   Procedure: POLYPECTOMY;  Surgeon: Golda Claudis PENNER, MD;  Location: [facility];  Service: Endoscopy;;  colon   PROSTATE SURGERY     RADIOLOGY WITH ANESTHESIA Left 02/14/2023   Procedure: IR WITH ANESTHESIA;  Surgeon: Radiologist, Medication, MD;  Location: [facility] OR;  Service: Radiology;  Laterality: Left;   RADIOLOGY WITH ANESTHESIA N/A 02/14/2023   Procedure: IR WITH ANESTHESIA;  Surgeon: Radiologist, Medication, MD;  Location: [facility] OR;  Service: Radiology;  Laterality: N/A;    Patient Active Problem List   Diagnosis Date Noted   Cognitive change 03/17/2023   Hyponatremia 03/13/2023   Pulmonary embolus (HCC) 03/05/2023   Acute deep vein thrombosis (DVT) of popliteal vein of left lower extremity (HCC) 03/05/2023   Chronic heart failure with mildly reduced ejection fraction (HFmrEF, 41-49%) (HCC) 03/05/2023   AKI (acute kidney injury) 03/05/2023   Precordial pain 03/03/2023   Shortness of breath 03/03/2023   Benign hypertension 03/03/2023   Heart failure with improved ejection fraction (HFimpEF) (HCC) 03/03/2023   Coping style affecting medical condition 02/27/2023   Acute right PCA stroke (HCC) 02/21/2023   Acute ischemic right PCA stroke (HCC) 02/14/2023   Posterior cerebral artery embolism, right 02/14/2023   Special screening for malignant neoplasms, colon 04/09/2017   Dizziness 03/29/2013   Cerebellar stroke, acute (HCC) 03/29/2013   CHF (congestive heart failure) (HCC) 03/28/2013   Vertigo 03/28/2013    PCP: Edward Norleen PEDLAR, MD  REFERRING PROVIDER: [name], MD  REFERRING DIAG: [phone] (ICD-10-CM) - Acute right PCA stroke (HCC)  THERAPY DIAG:  Left-sided weakness  Difficulty in walking, not elsewhere classified  Other abnormalities of gait and mobility  Other lack of coordination  Other symptoms and signs involving the nervous system  Rationale for Evaluation and Treatment: Rehabilitation  ONSET  DATE: 02/14/2023  SUBJECTIVE:   SUBJECTIVE STATEMENT: Patient reports that he fells pretty good today. He didn't have any problems after his last appointment. He has been trying to work on not dragging his left foot.    Eval:02/14/2023; woke up could not move; stroke; [name] and then CIR; then had home health and now referred to outpatient; he has an AFO but not wearing; states it does not fit; asked him to bring in next visit.  Had a WC but sent that back as he was not using it.  Steps most challenging thing for him; no handrails for  basement steps. States he is supposed to be having a surgery on his neck?  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   Washer and dryer in basement; wood stove  PERTINENT HISTORY: CHF, HTN, gout, stroke PAIN:  Are you having pain? Yes: NPRS scale: 0/10 Pain location: left upper trap and shoulder Pain description: sharp Aggravating factors: turning head to left Relieving factors: lidocaine  patch  PRECAUTIONS: Fall  RED FLAGS: None   WEIGHT BEARING RESTRICTIONS: No  FALLS:  Has patient fallen in last 6 months? Yes. Number of falls 2; at hospital slid out of WC  LIVING ENVIRONMENT: Lives with: lives with their spouse Lives in: House/apartment Stairs: Yes: Internal: 10 steps; none Has following equipment at home: Vannie - 2 wheeled, Grab bars, and Ramped entry  OCCUPATION: retired neurosurgeon  PLOF: Independent with household mobility with device and wife assists with buttons and sometimes with bathing  PATIENT GOALS: to walk like I used to; do steps better  NEXT MD VISIT: next month  OBJECTIVE:  Note: Objective measures were completed at Evaluation unless otherwise noted.  DIAGNOSTIC FINDINGS: I63.531 (ICD-10-CM) - Acute right PCA stroke (HCC)  PATIENT SURVEYS:  LEFS  Extreme difficulty/unable (0), Quite a bit of difficulty (1), Moderate difficulty (2), Little difficulty (3), No difficulty (4) Survey date:    Any of your usual work, housework or school activities   2. Usual hobbies, recreational or sporting activities   3. Getting into/out of the bath   4. Walking between rooms   5. Putting on socks/shoes   6. Squatting    7. Lifting an object, like a bag of groceries from the floor   8. Performing light activities around your home   9. Performing heavy activities around your home   10. Getting into/out  of a car   11. Walking 2 blocks   12. Walking 1 mile   13. Going up/down 10 stairs (1 flight)   14. Standing for 1 hour   15.  sitting for 1 hour   16. Running on even ground   17. Running on uneven ground   18. Making sharp turns while running fast   19. Hopping    20. Rolling over in bed   Score total:  21/80; 26.3%     COGNITION: Overall cognitive status: Within functional limits for tasks assessed     SENSATION:   EDEMA:  None noted  MUSCLE LENGTH: Hamstrings:   POSTURE: flexed trunk   PALPATION:   LOWER EXTREMITY MMT:  MMT Right eval Left eval Left 01/12/24  Hip flexion 4+ 4 4+  Hip extension     Hip abduction     Hip adduction  Hip internal rotation     Hip external rotation     Knee flexion     Knee extension 5 4+ 5  Ankle dorsiflexion 5 4 4   Ankle plantarflexion     Ankle inversion     Ankle eversion      (Blank rows = not tested)   FUNCTIONAL TESTS:  5 times sit to stand: 1.02.43 sec using hands to push up to standing 2 minute walk test: 125 ft with rollator SLS right 7 and left 4  GAIT: Distance walked: 125 ft Assistive device utilized: [employer]ant - 4 wheeled Level of assistance: Modified independence Comments: slight toe drop left; decreased gait speed                                                                                                                                TREATMENT DATE:  02/03/24 Nustep seat 11 level 3 x 5' dynamic warm up Heel raises on incline 2 x 10 with 1 UE assist Toe raises on decline 2 x 10 with 1 UE assist 6 box taps 2 x 10 with 1 UE assist Sit to stand with 1 UE assist or with no UE and min A to boost up to standing 2 x 5 from standard chair with arms Ball roll out in sitting to work on transferring weight forward Standing with 2# dowel bilateral upper extremity flexion x 10 Standing with 2# dowel rotation bilateral upper extremity x 10 Walking in // bars no UE assist down and back x 3 Sidestepping  in // bars no UE assist down and back x 2                                       02/01/24 EXERCISE LOG  Exercise Repetitions and Resistance Comments  Nustep  L4 x 5 minutes    Toe taps on step  8 step x 20 reps each  With cone to prevent left hip circumduction   Standing toe raise   20 reps    Static stance on foam   2 minutes  With therapist perturbations   Reaching outside BOS on foam  2 minutes  With boom sticks  Gait training  289 feet with rollator  With cueing for exaggerated step height   Stepping over hurdles  3 laps in parallel bars  2 low hurdles and 1 high hurdle; BUE support from parallel bars   Side stepping over hurdles  2 laps in parallel bars  2 low hurdles and 1 high hurdle; BUE support from parallel bars   Sit to stand  10 reps  Hold round tidal tank    Blank cell = exercise not performed today   01/27/24 Sit to stand from low mat table x 5 no UE assist; 2 x 5 with holding yellow med ball Elevated mat table up to  22.5 inches; sit to stand with holding sphere tidal tank 2 x 5 Standing: Boom stick in right hand hitting target (another boom stick PT is holding and moving); feet even x 15 reps then stagger stance each way x 15 reps each 1 lap PT gym with rollator Nustep seat 11 x 5' to end treatment   01/25/24 Nustep seat 11 level 3 dynamic warm up x 5' 6 step ups 2 x 10 leading with left leg leading; bilateral UE support Sit to stand with 1 UE assist 2 x 5 6 lateral step ups 2 x 10 with bilateral UE assist 4# hip abduction 2 x 10 4# hip extension 2 x 10 4# marching 2 x 10  PATIENT EDUCATION:  Education details: Patient educated on exam findings, POC, scope of PT, HEP, and what to expect next visit. Person educated: Patient Education method: Explanation, Demonstration, and Handouts Education comprehension: verbalized understanding, returned demonstration, verbal cues required, and tactile cues required  HOME EXERCISE PROGRAM: Access Code: [email or web address] URL:  [email or web address] Date: 12/03/2023 Prepared by: [facility]  Exercises - Sit to Stand with Armchair  - 2 x daily - 7 x weekly - 1 sets - 5 reps - Standing Ankle Plantar Flexion Dorsiflexion with Counter Support  - 2 x daily - 7 x weekly - 2 sets - 10 reps  01/15/24: GLENWOOD Chang Dog  - 2 x daily - 7 x weekly - 2 sets - 10 reps - 5 hold  ASSESSMENT:  CLINICAL IMPRESSION: Patient continues to present with impaired balance; gait; decreased strength left side > right and is at risk for falls.  Continued with treatment to address lower extremity strength and balance today.  Challenge with toe raises on decline as patient tends to lean back and needs UE assist to prevent loss of balance.  Still has some issue with transferring weight forward for sit to stand and needs UE assist or min A from PT to boost up from standing. Added Ball roll out in sitting to work on transferring weight forward.  Able to walk in // bars without UE assist but pacing is slow and guarded; decreased step length.   Patient continues to require skilled physical therapy to address their remaining impairments to return to her prior level of function.     Eval:Patient is a 74 y.o. male who was seen today for physical therapy evaluation and treatment for I63.531 (ICD-10-CM) - Acute right PCA stroke (HCC). Patient demonstrates decreased strength, balance deficits and gait abnormalities which are negatively impacting patient ability to perform ADLs and functional mobility tasks. Patient will benefit from skilled physical therapy services to address these deficits to improve level of function with ADLs, functional mobility tasks, and reduce risk for falls.    OBJECTIVE IMPAIRMENTS: Abnormal gait, increased fascial restrictions, impaired perceived functional ability, and pain.   ACTIVITY LIMITATIONS: carrying, lifting, bending, standing, squatting, stairs, and locomotion level  PARTICIPATION LIMITATIONS: meal prep,  cleaning, laundry, driving, shopping, and occupation  REHAB POTENTIAL: Good  CLINICAL DECISION MAKING: Evolving/moderate complexity  EVALUATION COMPLEXITY: Moderate   GOALS: Goals reviewed with patient? No  SHORT TERM GOALS: Target date: 12/24/2023 patient will be independent with initial HEP and compliant with HEP 3-4 times a week   Baseline: Goal status: in progress  2.  Patient will report 30% improvement overall  Baseline:  Goal status: met   LONG TERM GOALS: Target date: 02/09/2024  Patient will be independent in self management strategies to improve quality of life and  functional outcomes.  Baseline:  Goal status: in progress  2.  Patient will report 50% improvement overall  Baseline:  Goal status: met  3.  Patient will improve LEFS score by 9 points to demonstrate improved perceived function  Baseline: 21/80; 43/80 01/12/24 Goal status: met  4.  Patient will increase distance on 2 MWT to 225 ft or more with LRAD to demonstrate improved speed and efficiency with household and community ambulation  Baseline: 125 fit with rollator; 184 ft with rollator 01/12/24 Goal status: in progress  5.  Patient will improve 5 times sit to stand score to 30 sec or less to demonstrate improved functional mobility and increased leg strength.  Baseline: 1 min and 2.45 sec; 30.88 sec 01/12/24 Goal status: in progress    PLAN:  PT FREQUENCY: 1-2x/week  PT DURATION: 4 weeks  PLANNED INTERVENTIONS: 97164- PT Re-evaluation, 97110-Therapeutic exercises, 97530- Therapeutic activity, 97112- Neuromuscular re-education, 97535- Self Care, [id number]- Manual therapy, [phone]- Gait training, [phone]- Orthotic Fit/training, [phone]- Canalith repositioning, [id number]- Aquatic Therapy, [phone]- Splinting, [phone]- Wound care (first 20 sq cm), 97598- Wound care (each additional 20 sq cm)Patient/Family education, Balance training, Stair training, Taping, Dry Needling, Joint mobilization, Joint manipulation,  Spinal manipulation, Spinal mobilization, Scar mobilization, and DME instructions.   PLAN FOR NEXT SESSION:  Progress LE functional strength and stabilization; balance and gait;  Begin floor to standing, stair training and continue gait training on treadmill; reassess next visit  11:14 AM, 02/03/24 Kalisha Keadle Small Modine Oppenheimer MPT [facility] [place] [phone] Ph:[phone]   "

## 2024-02-03 NOTE — Therapy (Signed)
 " OUTPATIENT OCCUPATIONAL THERAPY NEURO TREATMENT NOTE    Patient Name: Edward Rodgers MRN: [id number] DOB:05/09/1950, 74 y.o., male Today's Date: 02/03/2024    END OF SESSION:  OT End of Session - 02/03/24 1231     Visit Number 9    Number of Visits 16    Date for Recertification  03/11/24    Authorization Type [employer]    Authorization Time Period 8 visits approved 02/02/24-03/01/24    Authorization - Visit Number 1    Authorization - Number of Visits 8    Progress Note Due on Visit 10    OT Start Time 1130    OT Stop Time 1215    OT Time Calculation (min) 45 min    Activity Tolerance Patient tolerated treatment well    Behavior During Therapy [facility] for tasks assessed/performed             Past Medical History:  Diagnosis Date   Cancer (HCC) 2002   prostate   CHF (congestive heart failure) (HCC)    Gout    Hypertension    Stroke (HCC)    Thyroid  disease    Past Surgical History:  Procedure Laterality Date   COLONOSCOPY N/A 07/15/2017   Procedure: COLONOSCOPY;  Surgeon: Golda Claudis PENNER, MD;  Location: [facility];  Service: Endoscopy;  Laterality: N/A;  830   IR CT HEAD LTD  02/14/2023   IR PERCUTANEOUS ART THROMBECTOMY/INFUSION INTRACRANIAL INC DIAG ANGIO  02/14/2023   LOOP RECORDER INSERTION N/A 02/20/2023   Procedure: LOOP RECORDER INSERTION;  Surgeon: Fernande Elspeth BROCKS, MD;  Location: [facility] INVASIVE CV LAB;  Service: Cardiovascular;  Laterality: N/A;   POLYPECTOMY  07/15/2017   Procedure: POLYPECTOMY;  Surgeon: Golda Claudis PENNER, MD;  Location: [facility];  Service: Endoscopy;;  colon   PROSTATE SURGERY     RADIOLOGY WITH ANESTHESIA Left 02/14/2023   Procedure: IR WITH ANESTHESIA;  Surgeon: Radiologist, Medication, MD;  Location: [facility] OR;  Service: Radiology;  Laterality: Left;   RADIOLOGY WITH ANESTHESIA N/A 02/14/2023   Procedure: IR WITH ANESTHESIA;  Surgeon: Radiologist, Medication, MD;  Location: [facility] OR;  Service: Radiology;  Laterality: N/A;   Patient Active Problem  List   Diagnosis Date Noted   Cognitive change 03/17/2023   Hyponatremia 03/13/2023   Pulmonary embolus (HCC) 03/05/2023   Acute deep vein thrombosis (DVT) of popliteal vein of left lower extremity (HCC) 03/05/2023   Chronic heart failure with mildly reduced ejection fraction (HFmrEF, 41-49%) (HCC) 03/05/2023   AKI (acute kidney injury) 03/05/2023   Precordial pain 03/03/2023   Shortness of breath 03/03/2023   Benign hypertension 03/03/2023   Heart failure with improved ejection fraction (HFimpEF) (HCC) 03/03/2023   Coping style affecting medical condition 02/27/2023   Acute right PCA stroke (HCC) 02/21/2023   Acute ischemic right PCA stroke (HCC) 02/14/2023   Posterior cerebral artery embolism, right 02/14/2023   Special screening for malignant neoplasms, colon 04/09/2017   Dizziness 03/29/2013   Cerebellar stroke, acute (HCC) 03/29/2013   CHF (congestive heart failure) (HCC) 03/28/2013   Vertigo 03/28/2013   PCP: Dr. Norleen Hurst  REFERRING PROVIDER: Dr. Murray Collier  ONSET DATE: 02/2023  REFERRING DIAG: P36.468 (ICD-10-CM) - Acute right PCA stroke (HCC)   THERAPY DIAG:  Other symptoms and signs involving the nervous system  Acute right PCA stroke (HCC)  Other lack of coordination  Rationale for Evaluation and Treatment: Rehabilitation  SUBJECTIVE:   SUBJECTIVE STATEMENT: S: I am trying to get this arm  right   PERTINENT HISTORY: 02/14/2023; woke up could not move; stroke; [facility] and then CIR; then had home health and now referred to outpatient; he has an AFO but not wearing; states it does not fit. Had a WC but sent that back as he was not using it.  Steps most challenging thing for him; no handrails for basement steps. States he is supposed to be having a surgery on his neck.  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  Has washer and dryer in basement; wood stove.   PRECAUTIONS: Fall  WEIGHT BEARING RESTRICTIONS: No  PAIN:  Are you having pain? No  FALLS: Has patient fallen in last 6 months? No  PLOF: Independent  PATIENT GOALS: To be able to use the left hand to help with things.   OBJECTIVE:  Note: Objective measures were completed at Evaluation unless otherwise noted.  HAND DOMINANCE: Right  ADLs: Overall ADLs: Pt reports difficulty with holding items in the left hand, drops a lot of things. Pt reports he needs assistance with tying shoes, operating buttons. Pt has difficulty with cutting food when trying to hold the fork in his left hand.   FUNCTIONAL OUTCOME MEASURES: Quick Dash:  QUICK DASH  Please rate your ability do the following activities in the last week by selecting the number below the appropriate response.   Activities Rating  Open a tight or new jar.  5 = Unable  Do heavy household chores (e.g., wash walls, floors). 2 = Mild difficulty  Carry a shopping bag or briefcase 1 = No difficulty   Wash your back. 2 = Mild difficulty  Use a knife to cut food. 2 = Mild difficulty  Recreational activities in which you take some force or impact through your arm, shoulder or hand (e.g., golf, hammering, tennis, etc.). 5 = Unable  During the past week, to what extent has your arm, shoulder or hand problem interfered with your normal social activities with family, friends, neighbors or groups?  2 = Slightly  During the past week, were you limited in your work or other regular daily activities as a result of your arm, shoulder or hand problem? 3 = Moderately limited  Rate the severity of the following symptoms in the last week: Arm, Shoulder, or hand pain. 1 = none  Rate the severity of the following symptoms in the last week: Tingling (pins and needles) in your arm, shoulder or hand. 2 = Mild  During the past week, how much difficulty have you had sleeping because of the pain  in your arm, shoulder or hand?  1 = No difficulty   (A QuickDASH score may not be calculated if there is greater than 1 missing item.)  Quick Dash Disability/Symptom Score: [(sum of 26 (n) responses/11 (n)] -1 x 25 = 34.1  Minimally Clinically Important Difference (MCID): 15-20 points  (Franchignoni, F. et al. (2013). Minimally clinically important difference of the disabilities of the arm, shoulder, and hand outcome measures (DASH) and its shortened version (Quick DASH). Journal of Orthopaedic & Sports Physical Therapy, 44(1), 30-39)    UPPER EXTREMITY ROM:    Active ROM Left eval Left 01/27/24  Shoulder flexion 92 102  Shoulder abduction 95 109  Shoulder internal rotation 90 90  Shoulder external rotation 34 30  Wrist flexion [facility]  Wrist extension WFL   Wrist ulnar deviation WFL   Wrist radial deviation WFL   Wrist pronation WFL   Wrist supination WFL   (Blank rows = not tested)  UPPER EXTREMITY MMT:     MMT Left eval Left 01/27/24  Shoulder flexion 4/5 4/5  Shoulder abduction 4/5 4+/5  Shoulder internal rotation 5/5 5/5  Shoulder external rotation 4-/5 4/5  Elbow flexion 5/5 5/5  Elbow extension 4+/5 4+/5  Wrist flexion 5/5   Wrist extension 5/5   Wrist ulnar deviation 5/5   Wrist radial deviation 5/5   Wrist pronation 5/5   Wrist supination 5/5   (Blank rows = not tested)  HAND FUNCTION: Grip strength: Right: 65 lbs; Left: 50 lbs, Lateral pinch: Right: 19 lbs, Left: 15 lbs, and 3 point pinch: Right: 12 lbs, Left: 12 lbs 01/27/24: Left: 62; Left lateral pinch: 16; left 3 point pinch: 12  COORDINATION: 9 Hole Peg test: Right: 33.9 sec; Left: 1'53 sec 01/27/24: Left: 1'29  SENSATION: [place] Speed: 2.83-3.22=normal to decreased light touch sensation  EDEMA: None  COGNITION: Overall cognitive status: Within functional limits for tasks assessed  VISION: Subjective report: residual double vision Baseline vision: Wears glasses all the time Visual  history: glaucoma, cataracts, and had cataract surgery ~2006                                                                                                                             TREATMENT DATE:  02/03/24 -Small peg board: pincer grasp to pick up pegs from table and place into board  - Fine motor: screwing screws off of board with pincer grasp 5x, increased time  - UE strength: 3 pound weighted dowel rod; chest press, shoulder press, flexion, bicep curls; 2 pound dumb bells punches into flexion, abduction punches, shoulder press 10x  -Tital tank: 10 pound, in standing; rows, oblique swing to L and R 10x   02/01/24 -Pinch tree: pt sitting at table, using lateral pinch to grasp clothespins and line up the vertical bar. Pt using yellow, red, and green clothespins, placing 21 pins on the bar.  -Therapy ball strengthening: 1# wrist weight, blue kickball-chest press, flexion, circles each direction, PNF patterns, 10 reps -Sponges: 17, 12 -Small pegboard: pt holding several pegs in hand at one time, working on translating to fingertips to place into pegboard. Max difficulty with translation, requiring increased time to success. Dropping several pegs, placing 10. Pt removing one at a time, holding in palm, able to hold all 10 without dropping.  -Towel crumple: 5 reps -Digit opposition: 5 reps  01/27/24 -Shoulder strengthening: supine, 1# wrist weights-protraction, flexion, abduction, horizontal abduction, er, 10 reps -Proximal shoulder strengthening: supine, 1# wrist weights-paddles, criss cross, circles each direction, 10 reps -ABC writing: shoulder at 90 degrees flexion -Boom wackers:  -Pt alternating between colored boom wackers, OT calling color and pt hitting with his boomwacker  -OT holding boom wackers still, pt working on speed and fluidity  going back and forth between the two colors.   Completed in 4 positions, side to side at chest height, up and down at chest height, and PNF patterns  at chest   height. Good speed with side to side and up/down, mod difficulty with PNF patterns -Therapy ball strengthening: blue kickball-chest press, flexion, circles each direction, 10 reps   PATIENT EDUCATION: Education details: reviewed HEP Person educated: Patient Education method: Explanation, Demonstration, and Handouts Education comprehension: verbalized understanding and returned demonstration  HOME EXERCISE PROGRAM: Eval: finger A/ROM 1/19-shoulder A/ROM   GOALS: Goals reviewed with patient? Yes  SHORT TERM GOALS: Target date: 03/12/24  Pt will be provided with and educated on HEP to improve ability to use left hand during ADLs and housework tasks.   Goal status: IN PROGRESS  2.  Pt will be educated on strategies to improve ability to hug people and remove arm without brushing against people inappropriately.   Goal status: IN PROGRESS  3.  Pt will improve fine motor coordination in LUE required for operating buttons and manipulating small objects by completing 9 hole peg test in under 1'.   Goal status: IN PROGRESS  4.  Pt will increase left hand grip strength by 8# to improve ability to grasp and move items such as a backpack or grocery bag.   Goal status: MET  5.  Pt will increase LUE A/ROM by 20 degrees or greater to improve ability to perform functional reaching required for bathing, dressing, and reaching behind back.   Goal status: IN PROGRESS  6.  Pt will increase LUE strength for shoulder flexion and abduction to 4+/5 or greater to improve ability to perform functional reaching tasks above shoulder height.   Goal status: IN PROGRESS  7.  Pt will improve bilateral coordination and motor planning required for operating utensils by reporting independence with cutting daily foods.   Goal status: IN PROGRESS  ASSESSMENT:  CLINICAL IMPRESSION: Session starting with fine motor tasks; increased time required to manipulate washers onto screw board. Pt did  well utilizing pincer grasp to place small pegs into board. Last half of session focusing on strength. VC throughout exercises to maintain proper body mechanics. Pt did well maintaining standing balance throughout functional tasks.     PERFORMANCE DEFICITS: in functional skills including ADLs, IADLs, coordination, dexterity, tone, ROM, strength, and UE functional use   PLAN:  OT FREQUENCY: 2x/week  OT DURATION: 6 weeks  PLANNED INTERVENTIONS: 97168 OT Re-evaluation, 97535 self care/ADL training, 02889 therapeutic exercise, 97530 therapeutic activity, 97112 neuromuscular re-education, 97140 manual therapy, patient/family education, and DME and/or AE instructions  CONSULTED AND AGREED WITH PLAN OF CARE: Patient  PLAN FOR NEXT SESSION: Follow up on HEP, motor planning, coordination, and strengthening work   Chiquita Sermon, OTR/L  [phone] 02/03/2024, 12:35 PM          "

## 2024-02-09 ENCOUNTER — Ambulatory Visit (HOSPITAL_COMMUNITY)

## 2024-02-09 DIAGNOSIS — R278 Other lack of coordination: Secondary | ICD-10-CM | POA: Diagnosis not present

## 2024-02-09 DIAGNOSIS — R2689 Other abnormalities of gait and mobility: Secondary | ICD-10-CM

## 2024-02-09 DIAGNOSIS — R262 Difficulty in walking, not elsewhere classified: Secondary | ICD-10-CM

## 2024-02-09 DIAGNOSIS — R531 Weakness: Secondary | ICD-10-CM

## 2024-02-09 NOTE — Therapy (Signed)
 " OUTPATIENT PHYSICAL THERAPY LOWER EXTREMITY TREATMENT/PROGRESS NOTE Progress Note Reporting Period 12/03/2023 to 02/09/2024  See note below for Objective Data and Assessment of Progress/Goals.   PHYSICAL THERAPY DISCHARGE SUMMARY  Visits from Start of Care: 17  Current functional level related to goals / functional outcomes: See below   Remaining deficits: See below   Education / Equipment: HEP   Patient agrees to discharge. Patient goals were met. Patient is being discharged due to meeting the stated rehab goals. Has met all but one set rehab goal; missed 2 MWT goal by 7 feet.            Patient Name: Edward Rodgers MRN: [id number] DOB:[birth year], 74 y.o., male Today's Date: 02/09/2024  END OF SESSION:  PT End of Session - 02/09/24 0916     Visit Number 17    Number of Visits 17    Date for Recertification  02/09/24    Authorization Type [employer] Medicare    Authorization Time Period [employer] Med approved 12 visits from 01/15/2024-02/26/2024    Authorization - Visit Number 8    Authorization - Number of Visits 12    Progress Note Due on Visit 19    PT Start Time 0914    PT Stop Time 0945    PT Time Calculation (min) 31 min    Activity Tolerance Patient tolerated treatment well    Behavior During Therapy [facility] for tasks assessed/performed              Past Medical History:  Diagnosis Date   Cancer (HCC) 2002   prostate   CHF (congestive heart failure) (HCC)    Gout    Hypertension    Stroke (HCC)    Thyroid  disease    Past Surgical History:  Procedure Laterality Date   COLONOSCOPY N/A 07/15/2017   Procedure: COLONOSCOPY;  Surgeon: Golda Claudis PENNER, MD;  Location: [facility];  Service: Endoscopy;  Laterality: N/A;  830   IR CT HEAD LTD  02/14/2023   IR PERCUTANEOUS ART THROMBECTOMY/INFUSION INTRACRANIAL INC DIAG ANGIO  02/14/2023   LOOP RECORDER INSERTION N/A 02/20/2023   Procedure: LOOP RECORDER INSERTION;  Surgeon: Fernande Elspeth BROCKS, MD;  Location: [facility] INVASIVE  CV LAB;  Service: Cardiovascular;  Laterality: N/A;   POLYPECTOMY  07/15/2017   Procedure: POLYPECTOMY;  Surgeon: Golda Claudis PENNER, MD;  Location: [facility];  Service: Endoscopy;;  colon   PROSTATE SURGERY     RADIOLOGY WITH ANESTHESIA Left 02/14/2023   Procedure: IR WITH ANESTHESIA;  Surgeon: Radiologist, Medication, MD;  Location: [facility] OR;  Service: Radiology;  Laterality: Left;   RADIOLOGY WITH ANESTHESIA N/A 02/14/2023   Procedure: IR WITH ANESTHESIA;  Surgeon: Radiologist, Medication, MD;  Location: [facility] OR;  Service: Radiology;  Laterality: N/A;   Patient Active Problem List   Diagnosis Date Noted   Cognitive change 03/17/2023   Hyponatremia 03/13/2023   Pulmonary embolus (HCC) 03/05/2023   Acute deep vein thrombosis (DVT) of popliteal vein of left lower extremity (HCC) 03/05/2023   Chronic heart failure with mildly reduced ejection fraction (HFmrEF, 41-49%) (HCC) 03/05/2023   AKI (acute kidney injury) 03/05/2023   Precordial pain 03/03/2023   Shortness of breath 03/03/2023   Benign hypertension 03/03/2023   Heart failure with improved ejection fraction (HFimpEF) (HCC) 03/03/2023   Coping style affecting medical condition 02/27/2023   Acute right PCA stroke (HCC) 02/21/2023   Acute ischemic right PCA stroke (HCC) 02/14/2023   Posterior cerebral artery embolism, right 02/14/2023  Special screening for malignant neoplasms, colon 04/09/2017   Dizziness 03/29/2013   Cerebellar stroke, acute (HCC) 03/29/2013   CHF (congestive heart failure) (HCC) 03/28/2013   Vertigo 03/28/2013    PCP: Shona Norleen PEDLAR, MD  REFERRING PROVIDER: [name], MD  REFERRING DIAG: [phone] (ICD-10-CM) - Acute right PCA stroke (HCC)  THERAPY DIAG:  Left-sided weakness  Difficulty in walking, not elsewhere classified  Other abnormalities of gait and mobility  Other lack of coordination  Rationale for Evaluation and Treatment: Rehabilitation  ONSET DATE: 02/14/2023  SUBJECTIVE:   SUBJECTIVE  STATEMENT: Overall 80% better; arrives with rollator; late arrival today   Eval:02/14/2023; woke up could not move; stroke; [place] and then CIR; then had home health and now referred to outpatient; he has an AFO but not wearing; states it does not fit; asked him to bring in next visit.  Had a WC but sent that back as he was not using it.  Steps most challenging thing for him; no handrails for basement steps. States he is supposed to be having a surgery on his neck?  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   Washer and dryer in basement; wood stove  PERTINENT HISTORY: CHF, HTN, gout, stroke PAIN:  Are you having pain? Yes: NPRS scale: 0/10 Pain location: left upper trap and shoulder Pain description: sharp Aggravating factors: turning head to left Relieving factors: lidocaine  patch  PRECAUTIONS: Fall  RED FLAGS: None   WEIGHT BEARING RESTRICTIONS: No  FALLS:  Has patient fallen in last 6 months? Yes. Number of falls 2; at hospital slid out of WC  LIVING ENVIRONMENT: Lives with: lives with their spouse Lives in: House/apartment Stairs: Yes: Internal: 10 steps; none Has following equipment at home: Vannie - 2 wheeled, Grab bars, and Ramped entry  OCCUPATION: retired neurosurgeon  PLOF: Independent with household mobility with device and wife assists with buttons and sometimes with bathing  PATIENT GOALS: to walk like I used to; do steps better  NEXT MD VISIT: next month  OBJECTIVE:  Note: Objective measures were completed at Evaluation unless otherwise noted.  DIAGNOSTIC FINDINGS: I63.531 (ICD-10-CM) - Acute right PCA stroke (HCC)  PATIENT SURVEYS:  LEFS  Extreme difficulty/unable (0), Quite a bit of difficulty (1), Moderate difficulty (2), Little difficulty (3), No difficulty (4) Survey date:    Any of  your usual work, housework or school activities   2. Usual hobbies, recreational or sporting activities   3. Getting into/out of the bath   4. Walking between rooms   5. Putting on socks/shoes   6. Squatting    7. Lifting an object, like a bag of groceries from the floor   8. Performing light activities around your home   9. Performing heavy activities around your home   10. Getting into/out of a car   11. Walking 2 blocks   12. Walking 1 mile   13. Going up/down 10 stairs (1 flight)   14. Standing for 1 hour   15.  sitting for 1 hour   16. Running on even ground   17. Running on uneven ground   18. Making sharp turns while running fast   19. Hopping    20. Rolling over in bed   Score total:  21/80; 26.3%     COGNITION: Overall cognitive  status: Within functional limits for tasks assessed     SENSATION:   EDEMA:  None noted  MUSCLE LENGTH: Hamstrings:   POSTURE: flexed trunk   PALPATION:   LOWER EXTREMITY MMT:  MMT Right eval Left eval Left 01/12/24 Left 02/09/24  Hip flexion 4+ 4 4+ 4+  Hip extension      Hip abduction      Hip adduction      Hip internal rotation      Hip external rotation      Knee flexion      Knee extension 5 4+ 5   Ankle dorsiflexion 5 4 4    Ankle plantarflexion      Ankle inversion      Ankle eversion       (Blank rows = not tested)   FUNCTIONAL TESTS:  5 times sit to stand: 1.02.43 sec using hands to push up to standing 2 minute walk test: 125 ft with rollator SLS right 7 and left 4  GAIT: Distance walked: 125 ft Assistive device utilized: [employer]ant - 4 wheeled Level of assistance: Modified independence Comments: slight toe drop left; decreased gait speed                                                                                                                                TREATMENT DATE:  02/09/24 Nustep seat 11 level 3 x 5' dynamic warm up Progress note 5 times sit to stand 22.08 sec no UE assist 2 MWT 218  ft with rollator SLS 10 right foot; 5 on left foot LEFS 55/50 68.8% MMT's see above  02/03/24 Nustep seat 11 level 3 x 5' dynamic warm up Heel raises on incline 2 x 10 with 1 UE assist Toe raises on decline 2 x 10 with 1 UE assist 6 box taps 2 x 10 with 1 UE assist Sit to stand with 1 UE assist or with no UE and min A to boost up to standing 2 x 5 from standard chair with arms Ball roll out in sitting to work on transferring weight forward Standing with 2# dowel bilateral upper extremity flexion x 10 Standing with 2# dowel rotation bilateral upper extremity x 10 Walking in // bars no UE assist down and back x 3 Sidestepping in // bars no UE assist down and back x 2                                       02/01/24 EXERCISE LOG  Exercise Repetitions and Resistance Comments  Nustep  L4 x 5 minutes    Toe taps on step  8 step x 20 reps each  With cone to prevent left hip circumduction   Standing toe raise   20 reps    Static stance on foam   2 minutes  With therapist perturbations   Reaching  outside BOS on foam  2 minutes  With boom sticks  Gait training  289 feet with rollator  With cueing for exaggerated step height   Stepping over hurdles  3 laps in parallel bars  2 low hurdles and 1 high hurdle; BUE support from parallel bars   Side stepping over hurdles  2 laps in parallel bars  2 low hurdles and 1 high hurdle; BUE support from parallel bars   Sit to stand  10 reps  Hold round tidal tank    Blank cell = exercise not performed today   01/27/24 Sit to stand from low mat table x 5 no UE assist; 2 x 5 with holding yellow med ball Elevated mat table up to 22.5 inches; sit to stand with holding sphere tidal tank 2 x 5 Standing: Boom stick in right hand hitting target (another boom stick PT is holding and moving); feet even x 15 reps then stagger stance each way x 15 reps each 1 lap PT gym with rollator Nustep seat 11 x 5' to end treatment   01/25/24 Nustep seat 11 level 3  dynamic warm up x 5' 6 step ups 2 x 10 leading with left leg leading; bilateral UE support Sit to stand with 1 UE assist 2 x 5 6 lateral step ups 2 x 10 with bilateral UE assist 4# hip abduction 2 x 10 4# hip extension 2 x 10 4# marching 2 x 10  PATIENT EDUCATION:  Education details: Patient educated on exam findings, POC, scope of PT, HEP, and what to expect next visit. Person educated: Patient Education method: Explanation, Demonstration, and Handouts Education comprehension: verbalized understanding, returned demonstration, verbal cues required, and tactile cues required  HOME EXERCISE PROGRAM: Access Code: [email or web address] URL: [email or web address] Date: 12/03/2023 Prepared by: [facility] - [facility]  Exercises - Sit to Stand with Armchair  - 2 x daily - 7 x weekly - 1 sets - 5 reps - Standing Ankle Plantar Flexion Dorsiflexion with Counter Support  - 2 x daily - 7 x weekly - 2 sets - 10 reps  01/15/24: GLENWOOD Chang Dog  - 2 x daily - 7 x weekly - 2 sets - 10 reps - 5 hold  ASSESSMENT:  CLINICAL IMPRESSION: Late arrival today.  Progress note. Patient has met all but one set rehab goal; missed 2 MWT by 7 ft.  Patient is agreeable to discharge at this time.      Eval:Patient is a 74 y.o. male who was seen today for physical therapy evaluation and treatment for I63.531 (ICD-10-CM) - Acute right PCA stroke (HCC). Patient demonstrates decreased strength, balance deficits and gait abnormalities which are negatively impacting patient ability to perform ADLs and functional mobility tasks. Patient will benefit from skilled physical therapy services to address these deficits to improve level of function with ADLs, functional mobility tasks, and reduce risk for falls.    OBJECTIVE IMPAIRMENTS: Abnormal gait, increased fascial restrictions, impaired perceived functional ability, and pain.   ACTIVITY LIMITATIONS: carrying, lifting, bending, standing, squatting, stairs, and locomotion  level  PARTICIPATION LIMITATIONS: meal prep, cleaning, laundry, driving, shopping, and occupation  REHAB POTENTIAL: Good  CLINICAL DECISION MAKING: Evolving/moderate complexity  EVALUATION COMPLEXITY: Moderate   GOALS: Goals reviewed with patient? No  SHORT TERM GOALS: Target date: 12/24/2023 patient will be independent with initial HEP and compliant with HEP 3-4 times a week   Baseline: Goal status: in progress  2.  Patient will report 30% improvement overall  Baseline:  Goal status: met  LONG TERM GOALS: Target date: 02/09/2024  Patient will be independent in self management strategies to improve quality of life and functional outcomes.  Baseline:  Goal status: in progress  2.  Patient will report 50% improvement overall  Baseline:  Goal status: met  3.  Patient will improve LEFS score by 9 points to demonstrate improved perceived function  Baseline: 21/80; 43/80 01/12/24; 55/80 Goal status: met  4.  Patient will increase distance on 2 MWT to 225 ft or more with LRAD to demonstrate improved speed and efficiency with household and community ambulation  Baseline: 125 fit with rollator; 184 ft with rollator 01/12/24; 218 ft with rollator Goal status: in progress  5.  Patient will improve 5 times sit to stand score to 30 sec or less to demonstrate improved functional mobility and increased leg strength.  Baseline: 1 min and 2.45 sec; 30.88 sec 01/12/24; 22.08 sec no UE assist Goal status: met   PLAN:  PT FREQUENCY: 1-2x/week  PT DURATION: 4 weeks  PLANNED INTERVENTIONS: 97164- PT Re-evaluation, 97110-Therapeutic exercises, 97530- Therapeutic activity, 97112- Neuromuscular re-education, 97535- Self Care, [id number]- Manual therapy, [id number]- Gait training, [phone]- Orthotic Fit/training, [phone]- Canalith repositioning, [id number]- Aquatic Therapy, [phone]- Splinting, [phone]- Wound care (first 20 sq cm), 97598- Wound care (each additional 20 sq cm)Patient/Family education, Balance  training, Stair training, Taping, Dry Needling, Joint mobilization, Joint manipulation, Spinal manipulation, Spinal mobilization, Scar mobilization, and DME instructions.   PLAN FOR NEXT SESSION:  discharge  9:47 AM, 02/09/24 Geovanie Winnett Small Zalan Shidler MPT [place] [facility] [place] [phone] [phone]   "

## 2024-02-13 ENCOUNTER — Ambulatory Visit: Attending: Cardiology

## 2024-02-13 DIAGNOSIS — I639 Cerebral infarction, unspecified: Secondary | ICD-10-CM

## 2024-02-15 ENCOUNTER — Ambulatory Visit (HOSPITAL_COMMUNITY)

## 2024-02-15 LAB — CUP PACEART REMOTE DEVICE CHECK
Date Time Interrogation Session: 20260130232527
Implantable Pulse Generator Implant Date: 20250207

## 2024-02-19 NOTE — Progress Notes (Signed)
 Remote Loop Recorder Transmission

## 2024-02-23 ENCOUNTER — Encounter: Attending: Physical Medicine & Rehabilitation | Admitting: Physical Medicine & Rehabilitation

## 2024-03-15 ENCOUNTER — Ambulatory Visit

## 2024-04-15 ENCOUNTER — Ambulatory Visit
